# Patient Record
Sex: Female | Born: 1945 | Race: White | Hispanic: No | Smoking: Never smoker
Health system: Southern US, Community
[De-identification: ages and names within clinical notes are randomized; demographics above are authoritative.]

## PROBLEM LIST (undated history)

## (undated) DIAGNOSIS — K219 Gastro-esophageal reflux disease without esophagitis: Secondary | ICD-10-CM

## (undated) DIAGNOSIS — F329 Major depressive disorder, single episode, unspecified: Secondary | ICD-10-CM

## (undated) DIAGNOSIS — I4892 Unspecified atrial flutter: Secondary | ICD-10-CM

## (undated) DIAGNOSIS — F32A Depression, unspecified: Secondary | ICD-10-CM

## (undated) DIAGNOSIS — M1990 Unspecified osteoarthritis, unspecified site: Secondary | ICD-10-CM

## (undated) DIAGNOSIS — I4891 Unspecified atrial fibrillation: Secondary | ICD-10-CM

## (undated) DIAGNOSIS — I499 Cardiac arrhythmia, unspecified: Secondary | ICD-10-CM

## (undated) DIAGNOSIS — T7840XA Allergy, unspecified, initial encounter: Secondary | ICD-10-CM

## (undated) DIAGNOSIS — I10 Essential (primary) hypertension: Secondary | ICD-10-CM

## (undated) DIAGNOSIS — E785 Hyperlipidemia, unspecified: Secondary | ICD-10-CM

## (undated) DIAGNOSIS — G459 Transient cerebral ischemic attack, unspecified: Secondary | ICD-10-CM

## (undated) DIAGNOSIS — E039 Hypothyroidism, unspecified: Secondary | ICD-10-CM

## (undated) HISTORY — DX: Depression, unspecified: F32.A

## (undated) HISTORY — DX: Unspecified atrial flutter: I48.92

## (undated) HISTORY — PX: ABDOMINAL HYSTERECTOMY: SHX81

## (undated) HISTORY — DX: Essential (primary) hypertension: I10

## (undated) HISTORY — DX: Hypothyroidism, unspecified: E03.9

## (undated) HISTORY — DX: Major depressive disorder, single episode, unspecified: F32.9

## (undated) HISTORY — DX: Hyperlipidemia, unspecified: E78.5

## (undated) HISTORY — DX: Gastro-esophageal reflux disease without esophagitis: K21.9

## (undated) HISTORY — DX: Allergy, unspecified, initial encounter: T78.40XA

## (undated) HISTORY — DX: Unspecified atrial fibrillation: I48.91

## (undated) HISTORY — PX: COLONOSCOPY: SHX174

---

## 1987-11-04 HISTORY — PX: PARTIAL HYSTERECTOMY: SHX80

## 1999-01-23 ENCOUNTER — Other Ambulatory Visit: Admission: RE | Admit: 1999-01-23 | Discharge: 1999-01-23 | Payer: Self-pay | Admitting: *Deleted

## 2000-12-18 ENCOUNTER — Other Ambulatory Visit: Admission: RE | Admit: 2000-12-18 | Discharge: 2000-12-18 | Payer: Self-pay | Admitting: Family Medicine

## 2004-02-23 ENCOUNTER — Encounter (INDEPENDENT_AMBULATORY_CARE_PROVIDER_SITE_OTHER): Payer: Self-pay | Admitting: Internal Medicine

## 2004-02-23 LAB — HM COLONOSCOPY

## 2004-11-01 ENCOUNTER — Ambulatory Visit: Payer: Self-pay | Admitting: Internal Medicine

## 2005-01-29 ENCOUNTER — Other Ambulatory Visit: Admission: RE | Admit: 2005-01-29 | Discharge: 2005-01-29 | Payer: Self-pay | Admitting: Internal Medicine

## 2005-01-29 ENCOUNTER — Encounter (INDEPENDENT_AMBULATORY_CARE_PROVIDER_SITE_OTHER): Payer: Self-pay | Admitting: Internal Medicine

## 2005-01-29 ENCOUNTER — Ambulatory Visit: Payer: Self-pay | Admitting: Internal Medicine

## 2005-02-10 ENCOUNTER — Ambulatory Visit: Payer: Self-pay | Admitting: Family Medicine

## 2005-03-05 ENCOUNTER — Ambulatory Visit: Payer: Self-pay | Admitting: Family Medicine

## 2005-03-13 ENCOUNTER — Ambulatory Visit: Payer: Self-pay | Admitting: Family Medicine

## 2005-04-18 ENCOUNTER — Ambulatory Visit: Payer: Self-pay | Admitting: Family Medicine

## 2005-07-14 ENCOUNTER — Ambulatory Visit: Payer: Self-pay | Admitting: Family Medicine

## 2005-08-14 ENCOUNTER — Ambulatory Visit: Payer: Self-pay | Admitting: Family Medicine

## 2005-10-17 ENCOUNTER — Ambulatory Visit: Payer: Self-pay | Admitting: Family Medicine

## 2005-10-21 ENCOUNTER — Ambulatory Visit: Payer: Self-pay | Admitting: Family Medicine

## 2006-02-05 ENCOUNTER — Ambulatory Visit: Payer: Self-pay | Admitting: Family Medicine

## 2006-02-18 LAB — FECAL OCCULT BLOOD, GUAIAC: Fecal Occult Blood: NEGATIVE

## 2006-02-25 ENCOUNTER — Ambulatory Visit: Payer: Self-pay | Admitting: Family Medicine

## 2006-03-06 ENCOUNTER — Ambulatory Visit: Payer: Self-pay | Admitting: Internal Medicine

## 2006-03-26 ENCOUNTER — Encounter (INDEPENDENT_AMBULATORY_CARE_PROVIDER_SITE_OTHER): Payer: Self-pay | Admitting: Internal Medicine

## 2006-04-03 ENCOUNTER — Ambulatory Visit: Payer: Self-pay | Admitting: Family Medicine

## 2006-06-16 ENCOUNTER — Ambulatory Visit: Payer: Self-pay | Admitting: Internal Medicine

## 2006-07-21 ENCOUNTER — Ambulatory Visit: Payer: Self-pay | Admitting: Family Medicine

## 2006-10-16 ENCOUNTER — Ambulatory Visit: Payer: Self-pay | Admitting: Family Medicine

## 2006-10-16 ENCOUNTER — Encounter (INDEPENDENT_AMBULATORY_CARE_PROVIDER_SITE_OTHER): Payer: Self-pay | Admitting: Internal Medicine

## 2006-11-13 ENCOUNTER — Ambulatory Visit: Payer: Self-pay | Admitting: Family Medicine

## 2007-01-26 ENCOUNTER — Ambulatory Visit: Payer: Self-pay | Admitting: Family Medicine

## 2007-02-12 ENCOUNTER — Encounter: Payer: Self-pay | Admitting: Family Medicine

## 2007-04-14 ENCOUNTER — Encounter (INDEPENDENT_AMBULATORY_CARE_PROVIDER_SITE_OTHER): Payer: Self-pay | Admitting: Internal Medicine

## 2007-04-14 DIAGNOSIS — M810 Age-related osteoporosis without current pathological fracture: Secondary | ICD-10-CM | POA: Insufficient documentation

## 2007-04-14 DIAGNOSIS — F329 Major depressive disorder, single episode, unspecified: Secondary | ICD-10-CM | POA: Insufficient documentation

## 2007-04-14 DIAGNOSIS — J309 Allergic rhinitis, unspecified: Secondary | ICD-10-CM | POA: Insufficient documentation

## 2007-04-14 DIAGNOSIS — F32A Depression, unspecified: Secondary | ICD-10-CM | POA: Insufficient documentation

## 2007-04-14 DIAGNOSIS — E039 Hypothyroidism, unspecified: Secondary | ICD-10-CM | POA: Insufficient documentation

## 2007-04-14 DIAGNOSIS — I10 Essential (primary) hypertension: Secondary | ICD-10-CM | POA: Insufficient documentation

## 2007-04-14 DIAGNOSIS — R0982 Postnasal drip: Secondary | ICD-10-CM | POA: Insufficient documentation

## 2007-04-16 ENCOUNTER — Encounter (INDEPENDENT_AMBULATORY_CARE_PROVIDER_SITE_OTHER): Payer: Self-pay | Admitting: Internal Medicine

## 2007-04-16 ENCOUNTER — Ambulatory Visit: Payer: Self-pay | Admitting: Family Medicine

## 2007-04-19 LAB — CONVERTED CEMR LAB
ALT: 21 units/L (ref 0–40)
AST: 22 units/L (ref 0–37)
Direct LDL: 156 mg/dL
HDL: 67.2 mg/dL (ref >39.0)

## 2007-04-22 LAB — CONVERTED CEMR LAB
Bilirubin, Direct: 0.1 mg/dL (ref 0.0–0.3)
Vit D, 1,25-Dihydroxy: 31 (ref 20–57)

## 2007-05-28 ENCOUNTER — Ambulatory Visit: Payer: Self-pay | Admitting: Internal Medicine

## 2007-06-01 LAB — CONVERTED CEMR LAB
AST: 22 units/L (ref 0–37)
Direct LDL: 129.2 mg/dL
Triglycerides: 81 mg/dL (ref 0–149)

## 2007-06-03 ENCOUNTER — Telehealth (INDEPENDENT_AMBULATORY_CARE_PROVIDER_SITE_OTHER): Payer: Self-pay | Admitting: Internal Medicine

## 2007-08-03 ENCOUNTER — Ambulatory Visit: Payer: Self-pay | Admitting: Family Medicine

## 2007-08-03 DIAGNOSIS — M25579 Pain in unspecified ankle and joints of unspecified foot: Secondary | ICD-10-CM | POA: Insufficient documentation

## 2007-11-01 ENCOUNTER — Ambulatory Visit: Payer: Self-pay | Admitting: Family Medicine

## 2007-11-02 LAB — CONVERTED CEMR LAB
ALT: 16 units/L (ref 0–35)
AST: 18 units/L (ref 0–37)
Bilirubin, Direct: 0.1 mg/dL (ref 0.0–0.3)
Cholesterol: 195 mg/dL (ref 0–200)
HDL: 57.6 mg/dL (ref >39.0)
Total Bilirubin: 0.8 mg/dL (ref 0.3–1.2)
Total Protein: 7.2 g/dL (ref 6.0–8.3)
Triglycerides: 143 mg/dL (ref 0–149)

## 2007-12-02 ENCOUNTER — Ambulatory Visit: Payer: Self-pay | Admitting: Family Medicine

## 2007-12-20 ENCOUNTER — Ambulatory Visit: Payer: Self-pay | Admitting: Family Medicine

## 2007-12-20 DIAGNOSIS — L259 Unspecified contact dermatitis, unspecified cause: Secondary | ICD-10-CM | POA: Insufficient documentation

## 2007-12-27 ENCOUNTER — Telehealth (INDEPENDENT_AMBULATORY_CARE_PROVIDER_SITE_OTHER): Payer: Self-pay | Admitting: Internal Medicine

## 2007-12-28 ENCOUNTER — Telehealth (INDEPENDENT_AMBULATORY_CARE_PROVIDER_SITE_OTHER): Payer: Self-pay | Admitting: Internal Medicine

## 2008-01-28 ENCOUNTER — Ambulatory Visit: Payer: Self-pay | Admitting: Family Medicine

## 2008-02-14 ENCOUNTER — Encounter: Payer: Self-pay | Admitting: Family Medicine

## 2008-02-28 ENCOUNTER — Encounter (INDEPENDENT_AMBULATORY_CARE_PROVIDER_SITE_OTHER): Payer: Self-pay | Admitting: *Deleted

## 2008-02-28 ENCOUNTER — Encounter (INDEPENDENT_AMBULATORY_CARE_PROVIDER_SITE_OTHER): Payer: Self-pay | Admitting: Internal Medicine

## 2008-03-13 ENCOUNTER — Ambulatory Visit: Payer: Self-pay | Admitting: Family Medicine

## 2008-03-13 ENCOUNTER — Encounter (INDEPENDENT_AMBULATORY_CARE_PROVIDER_SITE_OTHER): Payer: Self-pay | Admitting: Internal Medicine

## 2008-04-06 ENCOUNTER — Encounter (INDEPENDENT_AMBULATORY_CARE_PROVIDER_SITE_OTHER): Payer: Self-pay | Admitting: *Deleted

## 2008-04-28 ENCOUNTER — Ambulatory Visit: Payer: Self-pay | Admitting: Family Medicine

## 2008-05-01 LAB — CONVERTED CEMR LAB
ALT: 19 units/L (ref 0–35)
AST: 22 units/L (ref 0–37)
Cholesterol: 181 mg/dL (ref 0–200)

## 2008-06-08 ENCOUNTER — Ambulatory Visit: Payer: Self-pay | Admitting: Family Medicine

## 2008-10-03 ENCOUNTER — Ambulatory Visit: Payer: Self-pay | Admitting: Internal Medicine

## 2008-10-05 LAB — CONVERTED CEMR LAB
ALT: 18 units/L (ref 0–35)
Albumin: 4.1 g/dL (ref 3.5–5.2)
Alkaline Phosphatase: 57 units/L (ref 39–117)
BUN: 14 mg/dL (ref 6–23)
Bilirubin, Direct: 0.1 mg/dL (ref 0.0–0.3)
CO2: 30 meq/L (ref 19–32)
Calcium: 9.4 mg/dL (ref 8.4–10.5)
Cholesterol: 166 mg/dL (ref 0–200)
GFR calc Af Amer: 93 mL/min
Glucose, Bld: 78 mg/dL (ref 70–99)
HDL: 70.3 mg/dL (ref >39.0)
Total Protein: 6.9 g/dL (ref 6.0–8.3)
VLDL: 20 mg/dL (ref 0–40)

## 2008-11-20 ENCOUNTER — Telehealth: Payer: Self-pay | Admitting: Family Medicine

## 2008-12-21 ENCOUNTER — Telehealth (INDEPENDENT_AMBULATORY_CARE_PROVIDER_SITE_OTHER): Payer: Self-pay | Admitting: Internal Medicine

## 2009-02-14 ENCOUNTER — Encounter (INDEPENDENT_AMBULATORY_CARE_PROVIDER_SITE_OTHER): Payer: Self-pay | Admitting: Internal Medicine

## 2009-02-15 ENCOUNTER — Ambulatory Visit: Payer: Self-pay | Admitting: Family Medicine

## 2009-02-15 ENCOUNTER — Other Ambulatory Visit: Admission: RE | Admit: 2009-02-15 | Discharge: 2009-02-15 | Payer: Self-pay | Admitting: Family Medicine

## 2009-02-15 ENCOUNTER — Encounter (INDEPENDENT_AMBULATORY_CARE_PROVIDER_SITE_OTHER): Payer: Self-pay | Admitting: Internal Medicine

## 2009-02-15 LAB — HM PAP SMEAR: HM Pap smear: NORMAL

## 2009-02-16 ENCOUNTER — Encounter (INDEPENDENT_AMBULATORY_CARE_PROVIDER_SITE_OTHER): Payer: Self-pay | Admitting: *Deleted

## 2009-02-16 ENCOUNTER — Encounter (INDEPENDENT_AMBULATORY_CARE_PROVIDER_SITE_OTHER): Payer: Self-pay | Admitting: Internal Medicine

## 2009-02-20 ENCOUNTER — Encounter: Payer: Self-pay | Admitting: Family Medicine

## 2009-02-20 ENCOUNTER — Telehealth (INDEPENDENT_AMBULATORY_CARE_PROVIDER_SITE_OTHER): Payer: Self-pay | Admitting: Internal Medicine

## 2009-02-20 ENCOUNTER — Encounter (INDEPENDENT_AMBULATORY_CARE_PROVIDER_SITE_OTHER): Payer: Self-pay | Admitting: *Deleted

## 2009-03-23 ENCOUNTER — Ambulatory Visit: Payer: Self-pay | Admitting: Family Medicine

## 2009-03-27 LAB — CONVERTED CEMR LAB
ALT: 23 units/L (ref 0–35)
AST: 21 units/L (ref 0–37)
Cholesterol: 180 mg/dL (ref 0–200)
LDL Cholesterol: 97 mg/dL (ref 0–99)
Total CHOL/HDL Ratio: 3
Triglycerides: 126 mg/dL (ref 0.0–149.0)

## 2009-04-23 ENCOUNTER — Telehealth: Payer: Self-pay | Admitting: Family Medicine

## 2009-06-21 ENCOUNTER — Telehealth (INDEPENDENT_AMBULATORY_CARE_PROVIDER_SITE_OTHER): Payer: Self-pay | Admitting: Internal Medicine

## 2009-08-07 ENCOUNTER — Ambulatory Visit: Payer: Self-pay | Admitting: Family Medicine

## 2009-09-20 ENCOUNTER — Telehealth (INDEPENDENT_AMBULATORY_CARE_PROVIDER_SITE_OTHER): Payer: Self-pay | Admitting: Internal Medicine

## 2009-10-02 ENCOUNTER — Ambulatory Visit: Payer: Self-pay | Admitting: Family Medicine

## 2009-10-04 LAB — CONVERTED CEMR LAB
Cholesterol: 168 mg/dL (ref 0–200)
TSH: 2.14 microintl units/mL (ref 0.35–5.50)
VLDL: 21.4 mg/dL (ref 0.0–40.0)

## 2009-10-08 ENCOUNTER — Ambulatory Visit: Payer: Self-pay | Admitting: Family Medicine

## 2009-10-08 DIAGNOSIS — M7750 Other enthesopathy of unspecified foot: Secondary | ICD-10-CM | POA: Insufficient documentation

## 2009-11-05 ENCOUNTER — Telehealth: Payer: Self-pay | Admitting: Family Medicine

## 2009-11-20 ENCOUNTER — Encounter: Payer: Self-pay | Admitting: Family Medicine

## 2010-01-08 ENCOUNTER — Encounter: Payer: Self-pay | Admitting: Family Medicine

## 2010-01-10 ENCOUNTER — Ambulatory Visit: Payer: Self-pay | Admitting: Family Medicine

## 2010-01-10 DIAGNOSIS — H6520 Chronic serous otitis media, unspecified ear: Secondary | ICD-10-CM | POA: Insufficient documentation

## 2010-02-19 ENCOUNTER — Encounter: Payer: Self-pay | Admitting: Family Medicine

## 2010-02-25 ENCOUNTER — Encounter (INDEPENDENT_AMBULATORY_CARE_PROVIDER_SITE_OTHER): Payer: Self-pay | Admitting: *Deleted

## 2010-02-28 ENCOUNTER — Ambulatory Visit: Payer: Self-pay | Admitting: Family Medicine

## 2010-03-05 ENCOUNTER — Encounter: Payer: Self-pay | Admitting: Family Medicine

## 2010-03-12 ENCOUNTER — Ambulatory Visit: Payer: Self-pay | Admitting: Family Medicine

## 2010-03-12 DIAGNOSIS — K219 Gastro-esophageal reflux disease without esophagitis: Secondary | ICD-10-CM | POA: Insufficient documentation

## 2010-03-12 DIAGNOSIS — D485 Neoplasm of uncertain behavior of skin: Secondary | ICD-10-CM | POA: Insufficient documentation

## 2010-03-19 ENCOUNTER — Telehealth: Payer: Self-pay | Admitting: Family Medicine

## 2010-03-21 ENCOUNTER — Encounter: Payer: Self-pay | Admitting: Family Medicine

## 2010-03-22 ENCOUNTER — Telehealth: Payer: Self-pay | Admitting: Family Medicine

## 2010-03-26 ENCOUNTER — Encounter: Payer: Self-pay | Admitting: Family Medicine

## 2010-03-26 ENCOUNTER — Ambulatory Visit: Payer: Self-pay | Admitting: Internal Medicine

## 2010-03-29 ENCOUNTER — Ambulatory Visit: Payer: Self-pay | Admitting: Family Medicine

## 2010-05-30 ENCOUNTER — Ambulatory Visit: Payer: Self-pay | Admitting: Family Medicine

## 2010-06-14 ENCOUNTER — Telehealth: Payer: Self-pay | Admitting: Family Medicine

## 2010-06-18 ENCOUNTER — Encounter (INDEPENDENT_AMBULATORY_CARE_PROVIDER_SITE_OTHER): Payer: Self-pay | Admitting: *Deleted

## 2010-07-24 ENCOUNTER — Ambulatory Visit: Payer: Self-pay | Admitting: Family Medicine

## 2010-07-24 DIAGNOSIS — R079 Chest pain, unspecified: Secondary | ICD-10-CM | POA: Insufficient documentation

## 2010-08-23 ENCOUNTER — Telehealth: Payer: Self-pay | Admitting: Family Medicine

## 2010-09-02 ENCOUNTER — Telehealth: Payer: Self-pay | Admitting: Family Medicine

## 2010-09-03 ENCOUNTER — Ambulatory Visit: Payer: Self-pay | Admitting: Family Medicine

## 2010-09-03 DIAGNOSIS — R229 Localized swelling, mass and lump, unspecified: Secondary | ICD-10-CM | POA: Insufficient documentation

## 2010-09-05 ENCOUNTER — Encounter: Admission: RE | Admit: 2010-09-05 | Discharge: 2010-09-05 | Payer: Self-pay | Admitting: Family Medicine

## 2010-09-09 ENCOUNTER — Ambulatory Visit: Payer: Self-pay | Admitting: Family Medicine

## 2010-09-25 ENCOUNTER — Telehealth: Payer: Self-pay | Admitting: Family Medicine

## 2010-12-03 NOTE — Medication Information (Signed)
Summary: Approval for Fexofenadine/[facility]  Approval for Fexofenadine/[facility]   Imported By: Lanelle Bal 03/29/2010 12:39:20  _____________________________________________________________________  External Attachment:    Type:   Image     Comment:   External Document

## 2010-12-03 NOTE — Assessment & Plan Note (Signed)
Summary: ESTABH FROM [facility]   Vital Signs:  Patient profile:   65 year old female Height:      63 inches Weight:      127.0 pounds BMI:     22.58 Temp:     97.5 degrees F oral Pulse rate:   76 / minute Pulse rhythm:   regular BP sitting:   110 / 70  (left arm) Cuff size:   regular  Vitals Entered By: Benny Lennert CMA Duncan Dull) (Mar 29, 2010 7:58 AM)  History of Present Illness: 65 yo new to me here to discuss allergies.   Has had allergies all her life and worse this year with a lot of phlegm and congesiton.   Some sinus headaches over forehead, eyes water and itch. Much worse outdoors.   both in and outdoors  no fever and no green drainage   usied 60 mg allegra two times a day, stopped working this year also flonase 1 spray in each nostril - as needed  advair for asthma two times once daily as needed.    No wheezing.  Advised to try Zyrtec, it's not helping.    has been to an allergist in past - helped for a while and then stopped working -- long time ago - over 10 years      Current Medications (verified): 1)  Flonase 50 Mcg/act Susp (Fluticasone Propionate) .... 2 Sprays in Each Nostril Once Daily 2)  Levothyroxine Sodium 50 Mcg Tabs (Levothyroxine Sodium) .... Take One By Mouth Daily 3)  Quinapril Hcl 10 Mg Tabs (Quinapril Hcl) .... Take One By Mouth Daily 4)  Potassium Chloride Cr 10 Meq Cpcr (Potassium Chloride) .... Take One By Mouth Daily 5)  Bumetanide 1 Mg Tabs (Bumetanide) .... Take One By Mouth Two Times A Day 6)  Calcium 600/vitamin D 600-400 Mg-Unit  Tabs (Calcium Carbonate-Vitamin D) .... Take 1 Tablet By Mouth Two Times A Day 7)  Vitamin E 1000 Unit Caps (Vitamin E) .... Take One By Mouth 3 X A Wk 8)  Advair Diskus 100-50 Mcg/dose Misc (Fluticasone-Salmeterol) .... Use Once Daily As Needed 9)  Lipitor 20 Mg  Tabs (Atorvastatin Calcium) .Marland Kitchen.. 1 Once Daily For Cholesterol 10)  Vitamin D 1000 Unit  Caps (Cholecalciferol) .... Take 1  Cap By Mouth Daily 11)  Premarin 0.3 Mg  Tabs (Estrogens Conjugated) .Marland Kitchen.. 1  Time  A Week 12)  Wellbutrin 75 Mg Tabs (Bupropion Hcl) .Marland Kitchen.. 1 Each Morning and 1 At [facility] Daily 13)  Zyrtec Allergy 10 Mg Tabs (Cetirizine Hcl) .Marland Kitchen.. 1 Pill Each Evening For Allergies Over The Counter 14)  Astepro 0.15 % Soln (Azelastine Hcl) .Marland Kitchen.. 1-2 Sprays in Each Nostril Two Times A Day 15)  Zantac 150 Mg Tabs (Ranitidine Hcl) .Marland Kitchen.. 1 By Mouth Two Times A Day 16)  Singulair 10 Mg Tabs (Montelukast Sodium) .Marland Kitchen.. 1 By Mouth Daily  Allergies (verified): No Known Drug Allergies  Review of Systems      See HPI General:  Denies chills and fever. Resp:  Denies cough, sputum productive, and wheezing. Allergy:  Complains of itching eyes, seasonal allergies, and sneezing.  Physical Exam  General:  Well-developed,well-nourished,in no acute distress; alert,appropriate and cooperative throughout examination Nose:  nares are congested and injected  Mouth:  pharynx pink and moist, no erythema, and no exudates.   Lungs:  Normal respiratory effort, chest expands symmetrically. Lungs are clear to auscultation, no crackles or wheezes. Heart:  Normal rate and regular rhythm.  S1 and S2 normal without gallop, murmur, click, rub or other extra sounds. Extremities:  No clubbing, cyanosis, edema, or deformity noted with normal full range of motion of all joints.   Psych:  normal affect, talkative and pleasant    Impression & Recommendations:  Problem # 1:  ALLERGIC RHINITIS (ICD-477.9) Assessment Deteriorated Time spent with patient 25 minutes, more than 50% of this time was spent counseling patient on allergic rhinitis and treatment options. Given component of asthma, will try Singulair 10 mg daily, advised to stop Zyrtec. Advised flonase daily instead of as needed. Given albuterol inhaler as well.  Her updated medication list for this problem includes:    Flonase 50 Mcg/act Susp (Fluticasone propionate) .Marland Kitchen... 2 sprays in each  nostril once daily    Zyrtec Allergy 10 Mg Tabs (Cetirizine hcl) .Marland Kitchen... 1 pill each evening for allergies over the counter    Astepro 0.15 % Soln (Azelastine hcl) .Marland Kitchen... 1-2 sprays in each nostril two times a day  Complete Medication List: 1)  Flonase 50 Mcg/act Susp (Fluticasone propionate) .... 2 sprays in each nostril once daily 2)  Levothyroxine Sodium 50 Mcg Tabs (Levothyroxine sodium) .... Take one by mouth daily 3)  Quinapril Hcl 10 Mg Tabs (Quinapril hcl) .... Take one by mouth daily 4)  Potassium Chloride Cr 10 Meq Cpcr (Potassium chloride) .... Take one by mouth daily 5)  Bumetanide 1 Mg Tabs (Bumetanide) .... Take one by mouth two times a day 6)  Calcium 600/vitamin D 600-400 Mg-unit Tabs (Calcium carbonate-vitamin d) .... Take 1 tablet by mouth two times a day 7)  Vitamin E 1000 Unit Caps (Vitamin e) .... Take one by mouth 3 x a wk 8)  Advair Diskus 100-50 Mcg/dose Misc (Fluticasone-salmeterol) .... Use once daily as needed 9)  Lipitor 20 Mg Tabs (Atorvastatin calcium) .Marland Kitchen.. 1 once daily for cholesterol 10)  Vitamin D 1000 Unit Caps (Cholecalciferol) .... Take 1 cap by mouth daily 11)  Premarin 0.3 Mg Tabs (Estrogens conjugated) .Marland Kitchen.. 1  time  a week 12)  Wellbutrin 75 Mg Tabs (Bupropion hcl) .Marland Kitchen.. 1 each morning and 1 at noon daily 13)  Zyrtec Allergy 10 Mg Tabs (Cetirizine hcl) .Marland Kitchen.. 1 pill each evening for allergies over the counter 14)  Astepro 0.15 % Soln (Azelastine hcl) .Marland Kitchen.. 1-2 sprays in each nostril two times a day 15)  Zantac 150 Mg Tabs (Ranitidine hcl) .Marland Kitchen.. 1 by mouth two times a day 16)  Singulair 10 Mg Tabs (Montelukast sodium) .Marland Kitchen.. 1 by mouth daily 17)  Proair Hfa 108 (90 Base) Mcg/act Aers (Albuterol sulfate) .... 2 puffs every 4-6 hours needed.  Patient Instructions: 1)  Great to meet you. 2)  Let's start out by taking Singulair 10 mg daily and flonase daily, we can add back the Allegra if necessary. Prescriptions: PROAIR HFA 108 (90 BASE) MCG/ACT AERS (ALBUTEROL  SULFATE) 2 puffs every 4-6 hours needed.  #1 x 0   Entered and Authorized by:   Ruthe Mannan MD   Signed by:   Ruthe Mannan MD on 03/29/2010   Method used:   Samples Given   RxID:   [id number] SINGULAIR 10 MG TABS (MONTELUKAST SODIUM) 1 by mouth daily  #30 x 6   Entered and Authorized by:   Ruthe Mannan MD   Signed by:   Ruthe Mannan MD on 03/29/2010   Method used:   Print then Give to Patient   RxID:   [id number]   Current Allergies (reviewed today): No known allergies

## 2010-12-03 NOTE — Progress Notes (Signed)
Summary: wants to start back on allegra  Phone Note Call from Patient Call back at [phone]   Caller: Patient Summary of Call: Pt wants to go back on generic allegra 60 mg's.  She said the singulair didnt work.  [facility] [address]. Initial call taken by: Lowella Petties CMA,  June 14, 2010 2:26 PM    New/Updated Medications: ALLEGRA 60 MG TABS (FEXOFENADINE HCL) 1 tab by mouth daily. Prescriptions: ALLEGRA 60 MG TABS (FEXOFENADINE HCL) 1 tab by mouth daily.  #30 x 6   Entered and Authorized by:   Ruthe Mannan MD   Signed by:   Ruthe Mannan MD on 06/14/2010   Method used:   [facility]        [facility]  [address] [phone]* (retail)       [address]       [place], [place]  [id number]       Ph: [phone] or [phone]       Fax: [phone]   RxID:   [id number]

## 2010-12-03 NOTE — Assessment & Plan Note (Signed)
Summary: DISCUSS ULTRASOUND RESULTS/CLE   Vital Signs:  Patient profile:   65 year old female Height:      63 inches Weight:      130 pounds BMI:     23.11 Temp:     98.0 degrees F oral Pulse rate:   80 / minute Pulse rhythm:   regular BP sitting:   110 / 62  (right arm) Cuff size:   regular  Vitals Entered By: Linde Gillis CMA Duncan Dull) (September 09, 2010 3:36 PM) CC: discuss ultrasound results   History of Present Illness: Patricia yo here to discuss ultrasound results.  Saw her on 11/1 wtih palpable small mass on right lateral abdomen.  Sent for ultrasound which showed a superficial soft tissue mass 12 x 7 x 6 mm consistent with Lipoma. Pt here with her husband because she has questions about what that means.   Has not grown in size.  Non tender, no eythema. No fevers, chills, nausea, vomiting or diarrhea.   Current Medications (verified): 1)  Flonase 50 Mcg/act Susp (Fluticasone Propionate) .... 2 Sprays in Each Nostril Once Daily 2)  Levothyroxine Sodium 50 Mcg Tabs (Levothyroxine Sodium) .... Take One By Mouth Daily 3)  Quinapril Hcl 10 Mg Tabs (Quinapril Hcl) .... Take One By Mouth Daily 4)  Potassium Chloride Cr 10 Meq Cpcr (Potassium Chloride) .... Take One By Mouth Daily 5)  Bumetanide 1 Mg Tabs (Bumetanide) .... Take One By Mouth Two Times A Day 6)  Calcium 600/vitamin D 600-400 Mg-Unit  Tabs (Calcium Carbonate-Vitamin D) .... Take 1 Tablet By Mouth Two Times A Day 7)  Vitamin E 1000 Unit Caps (Vitamin E) .... Take One By Mouth 3 X A Wk 8)  Advair Diskus 100-50 Mcg/dose Misc (Fluticasone-Salmeterol) .... Use Two Times A Day. 9)  Lipitor 20 Mg  Tabs (Atorvastatin Calcium) .Marland Kitchen.. 1 Once Daily For Cholesterol 10)  Vitamin D 1000 Unit  Caps (Cholecalciferol) .... Take 1 Cap By Mouth Daily 11)  Zantac 150 Mg Tabs (Ranitidine Hcl) .Marland Kitchen.. 1 By Mouth Two Times A Day 12)  Proair Hfa 108 (90 Base) Mcg/act Aers (Albuterol Sulfate) .... 2 Puffs Every 4-6 Hours Needed. 13)  Voltaren 1 %  Gel (Diclofenac Sodium) .... As Needed 14)  Vitamin D3 1000 Unit  Tabs (Cholecalciferol) .Marland Kitchen.. 1 By Mouth Daily 15)  Allegra 60 Mg Tabs (Fexofenadine Hcl) .... Take One By Mouth Two Times A Day 16)  Vicodin 5-500 Mg Tabs (Hydrocodone-Acetaminophen) .Marland Kitchen.. 1 Tab By Mouth Every 6 Hours As Needed For Pain. 17)  Cyclobenzaprine Hcl 10 Mg  Tabs (Cyclobenzaprine Hcl) .Marland Kitchen.. 1 By Mouth 2 Times Daily As Needed For Back Pain  Allergies (verified): No Known Drug Allergies  Review of Systems      See HPI General:  Denies fever.  Physical Exam  General:  Well-developed,well-nourished,in no acute distress; alert,appropriate and cooperative throughout examination VSS, non toxic appearing. Skin:  superficial  palpable mass on right lateral abdomen, unchanged in size, non erythematous, non tender to palp Psych:  normal affect, talkative and pleasant    Impression & Recommendations:  Problem # 1:  LOCALIZED SUPERFICIAL SWELLING MASS OR LUMP (ICD-782.2) Assessment New Utraound consistent with lipompa. Time spent with patient 15 minutes, Patricia than 50% of this time was spent counseling patient on lipomas. Agreed to just watch and wait. If it grows in size at all, will refer to surgery for removal.  Complete Medication List: 1)  Flonase 50 Mcg/act Susp (Fluticasone propionate) .... 2 sprays  in each nostril once daily 2)  Levothyroxine Sodium 50 Mcg Tabs (Levothyroxine sodium) .... Take one by mouth daily 3)  Quinapril Hcl 10 Mg Tabs (Quinapril hcl) .... Take one by mouth daily 4)  Potassium Chloride Cr 10 Meq Cpcr (Potassium chloride) .... Take one by mouth daily 5)  Bumetanide 1 Mg Tabs (Bumetanide) .... Take one by mouth two times a day 6)  Calcium 600/vitamin D 600-400 Mg-unit Tabs (Calcium carbonate-vitamin d) .... Take 1 tablet by mouth two times a day 7)  Vitamin E 1000 Unit Caps (Vitamin e) .... Take one by mouth 3 x a wk 8)  Advair Diskus 100-50 Mcg/dose Misc (Fluticasone-salmeterol) .... Use two  times a day. 9)  Lipitor 20 Mg Tabs (Atorvastatin calcium) .Marland Kitchen.. 1 once daily for cholesterol 10)  Vitamin D 1000 Unit Caps (Cholecalciferol) .... Take 1 cap by mouth daily 11)  Zantac 150 Mg Tabs (Ranitidine hcl) .Marland Kitchen.. 1 by mouth two times a day 12)  Proair Hfa 108 (90 Base) Mcg/act Aers (Albuterol sulfate) .... 2 puffs every 4-6 hours needed. 13)  Voltaren 1 % Gel (Diclofenac sodium) .... As needed 14)  Vitamin D3 1000 Unit Tabs (Cholecalciferol) .Marland Kitchen.. 1 by mouth daily 15)  Allegra 60 Mg Tabs (Fexofenadine hcl) .... Take one by mouth two times a day 16)  Vicodin 5-500 Mg Tabs (Hydrocodone-acetaminophen) .Marland Kitchen.. 1 tab by mouth every 6 hours as needed for pain. 17)  Cyclobenzaprine Hcl 10 Mg Tabs (Cyclobenzaprine hcl) .Marland Kitchen.. 1 by mouth 2 times daily as needed for back pain   Orders Added: 1)  Est. Patient Level III [04540]    Current Allergies (reviewed today): No known allergies

## 2010-12-03 NOTE — Letter (Signed)
Summary: [facility]  [facility]   Imported By: Lanelle Bal 01/17/2010 13:13:59  _____________________________________________________________________  External Attachment:    Type:   Image     Comment:   External Document

## 2010-12-03 NOTE — Assessment & Plan Note (Signed)
Summary: R BREAST,RIB,L KNEE PAIN FROM FALL,   Vital Signs:  Patient profile:   65 year old female Height:      63 inches Weight:      128 pounds BMI:     22.76 Temp:     97.8 degrees F oral Pulse rate:   72 / minute Pulse rhythm:   regular BP sitting:   102 / 62  (right arm) Cuff size:   regular  Vitals Entered By: Linde Gillis CMA Duncan Dull) (July 24, 2010 11:47 AM) CC: right breast, rib, and knee pain from fall   History of Present Illness: 66 yo here for right rib pain, left knee pain s/p fall yesterday.  Was playing tennis yesterday and running for a ball, tripped and landed on her right side. Scrapped her right elbow a little bit but her right ribs have been hurting quite a bit when she takes a deep breath or coughs.  No SOB. Scapped up her right knee as well. No swelling of knee, no pain in knee, no limp or limited ROM.  Current Medications (verified): 1)  Flonase 50 Mcg/act Susp (Fluticasone Propionate) .... 2 Sprays in Each Nostril Once Daily 2)  Levothyroxine Sodium 50 Mcg Tabs (Levothyroxine Sodium) .... Take One By Mouth Daily 3)  Quinapril Hcl 10 Mg Tabs (Quinapril Hcl) .... Take One By Mouth Daily 4)  Potassium Chloride Cr 10 Meq Cpcr (Potassium Chloride) .... Take One By Mouth Daily 5)  Bumetanide 1 Mg Tabs (Bumetanide) .... Take One By Mouth Two Times A Day 6)  Calcium 600/vitamin D 600-400 Mg-Unit  Tabs (Calcium Carbonate-Vitamin D) .... Take 1 Tablet By Mouth Two Times A Day 7)  Vitamin E 1000 Unit Caps (Vitamin E) .... Take One By Mouth 3 X A Wk 8)  Advair Diskus 100-50 Mcg/dose Misc (Fluticasone-Salmeterol) .... Use Once Daily As Needed 9)  Lipitor 20 Mg  Tabs (Atorvastatin Calcium) .Marland Kitchen.. 1 Once Daily For Cholesterol 10)  Vitamin D 1000 Unit  Caps (Cholecalciferol) .... Take 1 Cap By Mouth Daily 11)  Zantac 150 Mg Tabs (Ranitidine Hcl) .Marland Kitchen.. 1 By Mouth Two Times A Day 12)  Proair Hfa 108 (90 Base) Mcg/act Aers (Albuterol Sulfate) .... 2 Puffs Every 4-6 Hours  Needed. 13)  Voltaren 1 % Gel (Diclofenac Sodium) .... As Needed 14)  Vitamin D3 1000 Unit  Tabs (Cholecalciferol) .Marland Kitchen.. 1 By Mouth Daily 15)  Allegra 60 Mg Tabs (Fexofenadine Hcl) .... Take One By Mouth Two Times A Day 16)  Vicodin 5-500 Mg Tabs (Hydrocodone-Acetaminophen) .Marland Kitchen.. 1 Tab By Mouth Every 6 Hours As Needed For Pain. 17)  Cyclobenzaprine Hcl 10 Mg  Tabs (Cyclobenzaprine Hcl) .Marland Kitchen.. 1 By Mouth 2 Times Daily As Needed For Back Pain  Allergies (verified): No Known Drug Allergies  Past History:  Past Medical History: Last updated: 04/14/2007 Allergic rhinitis Depression Hypertension Hypothyroidism Osteopenia  Past Surgical History: Last updated: 04/14/2007 Partial hysterectom 1989 Colonoscopy wnl, 02/23/04 DEXA 50/05---5/07  Family History: Last updated: 01/28/2008 Father: Alive, never sees him Mother:Died at age 87 from breast cancer with brain metastases Siblings: 3 sisters alive and well  Paternal GF had a heart attack Maternal GF had diabetes There is drug abuse in her son and depression in her son Stroke in a paternal aunt.  Social History: Last updated: 02/15/2009 Marital Status: Widowed Children: 2 sons Occupation: [employer], widowed since January 07, 2023, husband died of a cerebral hemorrhage.--3/09 now works in [employer]--02/2009--will be retiring 6/12010  Risk Factors: Alcohol Use: <  1 (02/28/2010) Caffeine Use: 1 (02/28/2010) Exercise: yes (02/28/2010)  Risk Factors: Smoking Status: never (02/28/2010)  Review of Systems      See HPI General:  Denies malaise. Resp:  Denies shortness of breath. MS:  Denies joint pain, joint redness, and joint swelling.  Physical Exam  General:  Well-developed,well-nourished,in no acute distress; alert,appropriate and cooperative throughout examination Chest Wall:  right rib cage mildly tender to palp, pain also illicited with deep insipiration. Lungs:  Normal respiratory effort, chest expands symmetrically.  Lungs are clear to auscultation, no crackles or wheezes. Heart:  Normal rate and regular rhythm. S1 and S2 normal without gallop, murmur, click, rub or other extra sounds. Msk:  Left Knee- FROM, no effusion Neurologic:  alert & oriented X3, sensation intact to light touch, and gait normal.   Skin:  superficial abrasion on left knee and right elbow, no erythema or induration. Psych:  normal affect, talkative and pleasant    Impression & Recommendations:  Problem # 1:  RIB PAIN, RIGHT SIDED (ICD-786.50) Assessment New Likely muscle strain vs bruised(less likely fractured) ribcage. Unecessary at this point to get CXR as it will not change tx. Vicodin as needed pain.   Sent in rx for flexeril as well if no relief from vicodin. Advised to call immediately if symptoms worsen or go to ER if develops SOB. Orders: Prescription Created Electronically [id number])  Complete Medication List: 1)  Flonase 50 Mcg/act Susp (Fluticasone propionate) .... 2 sprays in each nostril once daily 2)  Levothyroxine Sodium 50 Mcg Tabs (Levothyroxine sodium) .... Take one by mouth daily 3)  Quinapril Hcl 10 Mg Tabs (Quinapril hcl) .... Take one by mouth daily 4)  Potassium Chloride Cr 10 Meq Cpcr (Potassium chloride) .... Take one by mouth daily 5)  Bumetanide 1 Mg Tabs (Bumetanide) .... Take one by mouth two times a day 6)  Calcium 600/vitamin D 600-400 Mg-unit Tabs (Calcium carbonate-vitamin d) .... Take 1 tablet by mouth two times a day 7)  Vitamin E 1000 Unit Caps (Vitamin e) .... Take one by mouth 3 x a wk 8)  Advair Diskus 100-50 Mcg/dose Misc (Fluticasone-salmeterol) .... Use once daily as needed 9)  Lipitor 20 Mg Tabs (Atorvastatin calcium) .Marland Kitchen.. 1 once daily for cholesterol 10)  Vitamin D 1000 Unit Caps (Cholecalciferol) .... Take 1 cap by mouth daily 11)  Zantac 150 Mg Tabs (Ranitidine hcl) .Marland Kitchen.. 1 by mouth two times a day 12)  Proair Hfa 108 (90 Base) Mcg/act Aers (Albuterol sulfate) .... 2 puffs every 4-6  hours needed. 13)  Voltaren 1 % Gel (Diclofenac sodium) .... As needed 14)  Vitamin D3 1000 Unit Tabs (Cholecalciferol) .Marland Kitchen.. 1 by mouth daily 15)  Allegra 60 Mg Tabs (Fexofenadine hcl) .... Take one by mouth two times a day 16)  Vicodin 5-500 Mg Tabs (Hydrocodone-acetaminophen) .Marland Kitchen.. 1 tab by mouth every 6 hours as needed for pain. 17)  Cyclobenzaprine Hcl 10 Mg Tabs (Cyclobenzaprine hcl) .Marland Kitchen.. 1 by mouth 2 times daily as needed for back pain Prescriptions: CYCLOBENZAPRINE HCL 10 MG  TABS (CYCLOBENZAPRINE HCL) 1 by mouth 2 times daily as needed for back pain  #20 x 0   Entered and Authorized by:   Ruthe Mannan MD   Signed by:   Ruthe Mannan MD on 07/24/2010   Method used:   [facility]        [facility]  [address] [phone]* (retail)       [address]  [place], [place]  [id number]       Ph: [phone] or [phone]       Fax: [phone]   RxID:   [id number] CYCLOBENZAPRINE HCL 10 MG  TABS (CYCLOBENZAPRINE HCL) 1 by mouth 2 times daily as needed for back pain  #20 x 0   Entered and Authorized by:   Ruthe Mannan MD   Signed by:   Ruthe Mannan MD on 07/24/2010   Method used:   Print then Give to Patient   RxID:   [id number] VICODIN 5-500 MG TABS (HYDROCODONE-ACETAMINOPHEN) 1 tab by mouth every 6 hours as needed for pain.  #30 x 0   Entered and Authorized by:   Ruthe Mannan MD   Signed by:   Ruthe Mannan MD on 07/24/2010   Method used:   Print then Give to Patient   RxID:   [id number]   Current Allergies (reviewed today): No known allergies

## 2010-12-03 NOTE — Assessment & Plan Note (Signed)
Summary: CHECK LUMP ON R SIDE/CLE   Vital Signs:  Patient profile:   65 year old female Height:      63 inches Weight:      128 pounds BMI:     22.76 Temp:     97.7 degrees F oral Pulse rate:   80 / minute Pulse rhythm:   regular BP sitting:   120 / 60  (right arm) Cuff size:   regular  Vitals Entered By: Linde Gillis CMA Duncan Dull) (September 03, 2010 8:56 AM) CC: check lump on right side   History of Present Illness: 65 yo here for lump on her right abdomen.  Noticed it two days ago. Has not grown in size.  Non tender, no eythema. No fevers, chills, nausea, vomiting or diarrhea. Never had anything like this before.  Overall feeling well, has a lot of stress in her life lately.  Some friends are in the hospital, her uncle just passed away.  Current Medications (verified): 1)  Flonase 50 Mcg/act Susp (Fluticasone Propionate) .... 2 Sprays in Each Nostril Once Daily 2)  Levothyroxine Sodium 50 Mcg Tabs (Levothyroxine Sodium) .... Take One By Mouth Daily 3)  Quinapril Hcl 10 Mg Tabs (Quinapril Hcl) .... Take One By Mouth Daily 4)  Potassium Chloride Cr 10 Meq Cpcr (Potassium Chloride) .... Take One By Mouth Daily 5)  Bumetanide 1 Mg Tabs (Bumetanide) .... Take One By Mouth Two Times A Day 6)  Calcium 600/vitamin D 600-400 Mg-Unit  Tabs (Calcium Carbonate-Vitamin D) .... Take 1 Tablet By Mouth Two Times A Day 7)  Vitamin E 1000 Unit Caps (Vitamin E) .... Take One By Mouth 3 X A Wk 8)  Advair Diskus 100-50 Mcg/dose Misc (Fluticasone-Salmeterol) .... Use Two Times A Day. 9)  Lipitor 20 Mg  Tabs (Atorvastatin Calcium) .Marland Kitchen.. 1 Once Daily For Cholesterol 10)  Vitamin D 1000 Unit  Caps (Cholecalciferol) .... Take 1 Cap By Mouth Daily 11)  Zantac 150 Mg Tabs (Ranitidine Hcl) .Marland Kitchen.. 1 By Mouth Two Times A Day 12)  Proair Hfa 108 (90 Base) Mcg/act Aers (Albuterol Sulfate) .... 2 Puffs Every 4-6 Hours Needed. 13)  Voltaren 1 % Gel (Diclofenac Sodium) .... As Needed 14)  Vitamin D3 1000 Unit   Tabs (Cholecalciferol) .Marland Kitchen.. 1 By Mouth Daily 15)  Allegra 60 Mg Tabs (Fexofenadine Hcl) .... Take One By Mouth Two Times A Day 16)  Vicodin 5-500 Mg Tabs (Hydrocodone-Acetaminophen) .Marland Kitchen.. 1 Tab By Mouth Every 6 Hours As Needed For Pain. 17)  Cyclobenzaprine Hcl 10 Mg  Tabs (Cyclobenzaprine Hcl) .Marland Kitchen.. 1 By Mouth 2 Times Daily As Needed For Back Pain  Allergies (verified): No Known Drug Allergies  Past History:  Past Medical History: Last updated: 04/14/2007 Allergic rhinitis Depression Hypertension Hypothyroidism Osteopenia  Past Surgical History: Last updated: 04/14/2007 Partial hysterectom 1989 Colonoscopy wnl, 02/23/04 DEXA 50/05---5/07  Family History: Last updated: 01/28/2008 Father: Alive, never sees him Mother:Died at age 73 from breast cancer with brain metastases Siblings: 3 sisters alive and well  Paternal GF had a heart attack Maternal GF had diabetes There is drug abuse in her son and depression in her son Stroke in a paternal aunt.  Social History: Last updated: 02/15/2009 Marital Status: Widowed Children: 2 sons Occupation: [employer], widowed since 2023-01-03, husband died of a cerebral hemorrhage.--3/09 now works in [employer]--02/2009--will be retiring 6/12010  Risk Factors: Alcohol Use: <1 (02/28/2010) Caffeine Use: 1 (02/28/2010) Exercise: yes (02/28/2010)  Risk Factors: Smoking Status: never (02/28/2010)  Review  of Systems      See HPI General:  Denies fever. GI:  Denies abdominal pain, change in bowel habits, nausea, and vomiting. GU:  Denies discharge and dysuria. MS:  Denies joint pain, joint redness, and joint swelling.  Physical Exam  General:  Well-developed,well-nourished,in no acute distress; alert,appropriate and cooperative throughout examination VSS, non toxic appearing. Mouth:  pharynx pink and moist, no erythema, and no exudates.   Abdomen:  Small, firm, subcutaneous palpable nodule, right lower quadrant.  NTTP. soft,  non-tender, normal bowel sounds, and no distention.   Cervical Nodes:  No lymphadenopathy noted Axillary Nodes:  No palpable lymphadenopathy Inguinal Nodes:  No significant adenopathy Psych:  normal affect, talkative and pleasant    Impression & Recommendations:  Problem # 1:  LOCALIZED SUPERFICIAL SWELLING MASS OR LUMP (ICD-782.2) Assessment New ? reactive lymph node vs other subcutaneous cyst. Discussed with Patricia Peck, no palpable nodes elsewhere throughout her body. Will get an abdominal ultrasound to evaluate further. Orders: Radiology Referral (Radiology)  Complete Medication List: 1)  Flonase 50 Mcg/act Susp (Fluticasone propionate) .... 2 sprays in each nostril once daily 2)  Levothyroxine Sodium 50 Mcg Tabs (Levothyroxine sodium) .... Take one by mouth daily 3)  Quinapril Hcl 10 Mg Tabs (Quinapril hcl) .... Take one by mouth daily 4)  Potassium Chloride Cr 10 Meq Cpcr (Potassium chloride) .... Take one by mouth daily 5)  Bumetanide 1 Mg Tabs (Bumetanide) .... Take one by mouth two times a day 6)  Calcium 600/vitamin D 600-400 Mg-unit Tabs (Calcium carbonate-vitamin d) .... Take 1 tablet by mouth two times a day 7)  Vitamin E 1000 Unit Caps (Vitamin e) .... Take one by mouth 3 x a wk 8)  Advair Diskus 100-50 Mcg/dose Misc (Fluticasone-salmeterol) .... Use two times a day. 9)  Lipitor 20 Mg Tabs (Atorvastatin calcium) .Marland Kitchen.. 1 once daily for cholesterol 10)  Vitamin D 1000 Unit Caps (Cholecalciferol) .... Take 1 cap by mouth daily 11)  Zantac 150 Mg Tabs (Ranitidine hcl) .Marland Kitchen.. 1 by mouth two times a day 12)  Proair Hfa 108 (90 Base) Mcg/act Aers (Albuterol sulfate) .... 2 puffs every 4-6 hours needed. 13)  Voltaren 1 % Gel (Diclofenac sodium) .... As needed 14)  Vitamin D3 1000 Unit Tabs (Cholecalciferol) .Marland Kitchen.. 1 by mouth daily 15)  Allegra 60 Mg Tabs (Fexofenadine hcl) .... Take one by mouth two times a day 16)  Vicodin 5-500 Mg Tabs (Hydrocodone-acetaminophen) .Marland Kitchen.. 1 tab by  mouth every 6 hours as needed for pain. 17)  Cyclobenzaprine Hcl 10 Mg Tabs (Cyclobenzaprine hcl) .Marland Kitchen.. 1 by mouth 2 times daily as needed for back pain  Patient Instructions: 1)  Please stop by to see Patricia Peck on your way out. Prescriptions: ZANTAC 150 MG TABS (RANITIDINE HCL) 1 by mouth two times a day  #180 x 3   Entered and Authorized by:   Ruthe Mannan MD   Signed by:   Ruthe Mannan MD on 09/03/2010   Method used:   Print then Give to Patient   RxID:   [id number] CYCLOBENZAPRINE HCL 10 MG  TABS (CYCLOBENZAPRINE HCL) 1 by mouth 2 times daily as needed for back pain  #180 x 0   Entered and Authorized by:   Ruthe Mannan MD   Signed by:   Ruthe Mannan MD on 09/03/2010   Method used:   Print then Give to Patient   RxID:   [id number] ADVAIR DISKUS 100-50 MCG/DOSE MISC (FLUTICASONE-SALMETEROL) Use two times a day.  #3  x 3   Entered and Authorized by:   Ruthe Mannan MD   Signed by:   Ruthe Mannan MD on 09/03/2010   Method used:   Print then Give to Patient   RxID:   [id number]    Orders Added: 1)  Radiology Referral [Radiology] 2)  Est. Patient Level IV [14782]    Current Allergies (reviewed today): No known allergies

## 2010-12-03 NOTE — Consult Note (Signed)
Summary: [facility],Note  Richard Tuchman,DPM,[facility],Note   Imported By: Beau Fanny 11/23/2009 16:46:25  _____________________________________________________________________  External Attachment:    Type:   Image     Comment:   External Document

## 2010-12-03 NOTE — Assessment & Plan Note (Signed)
Summary: allergies   Vital Signs:  Patient profile:   65 year old female Height:      63 inches Weight:      128 pounds BMI:     22.76 O2 Sat:      97 % on Room air Temp:     97.4 degrees F oral Pulse rate:   76 / minute Pulse rhythm:   regular BP sitting:   114 / 72  (right arm) Cuff size:   regular  Vitals Entered By: Lewanda Rife LPN (Mar 12, 2010 3:43 PM)  O2 Flow:  Room air CC: ?allergies, productive cough with clear mucus, drainage at back of throat, occasional trouble breathing, sometimes has problem swallowing pills and muscles feel tight in back. also wants spot on right wrist checked.   History of Present Illness: has had allergies all her life and worse this year keeps a lot of phlegm and congestion  some sinus headaches over forehead eyes water and itch  both in and outdoors  lot of drainage - is clear  some sneezing  no fever and no green drainage   using 60 mg allegra two times a day -- used to work until recently  also flonase 1 spray in each nostril - as needed  advair for asthma two times once daily -- this got a lot of work after stirring up some dirt in the yard  more coughing lately  not a lot of wheeze   several allergy meds in past - ? what they were has never tried zyrtec   has been to an allergist in past - helped for a while and then stopped working -- long time ago - over 10 years   sometimes has trouble with her meds going down -- like it got lodged  worse over last couple of weeks  a little burning in her throat    spot on wrist to look at - looks suspicous -- about a week   Allergies (verified): No Known Drug Allergies  Past History:  Past Medical History: Last updated: 04/14/2007 Allergic rhinitis Depression Hypertension Hypothyroidism Osteopenia  Past Surgical History: Last updated: 04/14/2007 Partial hysterectom 1989 Colonoscopy wnl, 02/23/04 DEXA 50/05---5/07  Family History: Last updated: 01/28/2008 Father: Alive,  never sees him Mother:Died at age 57 from breast cancer with brain metastases Siblings: 3 sisters alive and well  Paternal GF had a heart attack Maternal GF had diabetes There is drug abuse in her son and depression in her son Stroke in a paternal aunt.  Social History: Last updated: 02/15/2009 Marital Status: Widowed Children: 2 sons Occupation: [employer], widowed since January 24, 2023, husband died of a cerebral hemorrhage.--3/09 now works in [employer]--02/2009--will be retiring 6/12010  Risk Factors: Alcohol Use: <1 (02/28/2010) Caffeine Use: 1 (02/28/2010) Exercise: yes (02/28/2010)  Risk Factors: Smoking Status: never (02/28/2010)  Review of Systems General:  Denies fatigue, loss of appetite, and malaise. Eyes:  Complains of eye irritation; denies blurring, discharge, and eye pain. ENT:  Complains of earache, nasal congestion, postnasal drainage, and sinus pressure; denies ear discharge and sore throat. CV:  Denies chest pain or discomfort, palpitations, and swelling of feet. Resp:  Complains of cough; denies pleuritic, shortness of breath, sputum productive, and wheezing. GI:  Complains of indigestion; denies diarrhea, nausea, and vomiting. MS:  Denies joint pain and muscle weakness. Derm:  Denies itching, lesion(s), poor wound healing, and rash. Neuro:  Complains of headaches; denies numbness and tingling. Heme:  Denies abnormal bruising. Allergy:  Complains of seasonal allergies and sneezing; denies hives or rash.  Physical Exam  General:  Well-developed,well-nourished,in no acute distress; alert,appropriate and cooperative throughout examination Head:  normocephalic, atraumatic, and no abnormalities observed.  no sinus tenderness  Eyes:  vision grossly intact, pupils equal, pupils round, pupils reactive to light, and no injection.   Ears:  TMs are dull bilat  Nose:  nares are congested and injected  Mouth:  pharynx pink and moist, no erythema, and no exudates.    Neck:  supple with full rom and no masses or thyromegally, no JVD or carotid bruit  Lungs:  Normal respiratory effort, chest expands symmetrically. Lungs are clear to auscultation, no crackles or wheezes. Heart:  Normal rate and regular rhythm. S1 and S2 normal without gallop, murmur, click, rub or other extra sounds. Abdomen:  Bowel sounds positive,abdomen soft and non-tender without masses, organomegaly or hernias noted. Skin:  R wrist 3 mm raised shiny pink lesion much solar aging with lentigos Cervical Nodes:  No lymphadenopathy noted Psych:  normal affect, talkative and pleasant    Impression & Recommendations:  Problem # 1:  ALLERGIC RHINITIS (ICD-477.9) Assessment Deteriorated  worse lately - also with acid reflux  will change from allegra to zyrtec inc flonase to 2 sprays in each nostril once daily  add astepro- samples given to try wear mask for yardwork continue advair f/u Dr Patsy Lager 1 mo  The following medications were removed from the medication list:    Allegra 60 Mg Tabs (Fexofenadine hcl) .Marland Kitchen... Take one by mouth two times a day Her updated medication list for this problem includes:    Flonase 50 Mcg/act Susp (Fluticasone propionate) .Marland Kitchen... 2 sprays in each nostril once daily    Zyrtec Allergy 10 Mg Tabs (Cetirizine hcl) .Marland Kitchen... 1 pill each evening for allergies over the counter    Astepro 0.15 % Soln (Azelastine hcl) .Marland Kitchen... 1-2 sprays in each nostril two times a day  Orders: Prescription Created Electronically [phone])  Problem # 2:  GERD (ICD-530.81) Assessment: New  heartburn symptoms also with some dysphagia will try zantac 150 two times a day and f/u 1 mo  if not imp may need to consider endoscopy update if worse pt aware this can worsen asthma as well  Her updated medication list for this problem includes:    Zantac 150 Mg Tabs (Ranitidine hcl) .Marland Kitchen... 1 by mouth two times a day  Orders: Prescription Created Electronically [phone])  Problem # 3:  NEOPLASM,  SKIN, UNCERTAIN BEHAVIOR (ICD-238.2) Assessment: New pink raised area/ shiny - R wrist  uncertain behavior - pt has much solar change and aging will f/u Dr Terri Piedra for eval and likely removal  need to r/o basal cell lesion  Complete Medication List: 1)  Flonase 50 Mcg/act Susp (Fluticasone propionate) .... 2 sprays in each nostril once daily 2)  Levothyroxine Sodium 50 Mcg Tabs (Levothyroxine sodium) .... Take one by mouth daily 3)  Quinapril Hcl 10 Mg Tabs (Quinapril hcl) .... Take one by mouth daily 4)  Potassium Chloride Cr 10 Meq Cpcr (Potassium chloride) .... Take one by mouth daily 5)  Bumetanide 1 Mg Tabs (Bumetanide) .... Take one by mouth two times a day 6)  Calcium 600/vitamin D 600-400 Mg-unit Tabs (Calcium carbonate-vitamin d) .... Take 1 tablet by mouth two times a day 7)  Vitamin E 1000 Unit Caps (Vitamin e) .... Take one by mouth 3 x a wk 8)  Advair Diskus 100-50 Mcg/dose Misc (Fluticasone-salmeterol) .... Use once daily as needed  9)  Lipitor 20 Mg Tabs (Atorvastatin calcium) .Marland Kitchen.. 1 once daily for cholesterol 10)  Vitamin D 1000 Unit Caps (Cholecalciferol) .... Take 1 cap by mouth daily 11)  Premarin 0.3 Mg Tabs (Estrogens conjugated) .Marland Kitchen.. 1  time  a week 12)  Wellbutrin 75 Mg Tabs (Bupropion hcl) .Marland Kitchen.. 1 each morning and 1 at noon daily 13)  Zyrtec Allergy 10 Mg Tabs (Cetirizine hcl) .Marland Kitchen.. 1 pill each evening for allergies over the counter 14)  Astepro 0.15 % Soln (Azelastine hcl) .Marland Kitchen.. 1-2 sprays in each nostril two times a day 15)  Zantac 150 Mg Tabs (Ranitidine hcl) .Marland Kitchen.. 1 by mouth two times a day  Patient Instructions: 1)  stop the allegra  2)  start zyrtec 10 mg 1 pill each night  3)  increase flonase to 2 sprays in each nostril at the same time every day  4)  continue advair 5)  use mask for yardwork  6)  try astepro as directed- samples 7)  make follow up wtih Dr Terri Piedra for spot on wrist  8)  start zantac 150 mg 1 pill two times a day - for acid reflux -- hope this  will also help swallowing 9)  follow up with Dr Patsy Lager in 1 month  Prescriptions: ZANTAC 150 MG TABS (RANITIDINE HCL) 1 by mouth two times a day  #60 x 3   Entered and Authorized by:   Judith Part MD   Signed by:   Judith Part MD on 03/12/2010   Method used:   [phone]       [address]       [place], [place]  [id number]       Ph: [phone] or [phone]       Fax: [phone]   RxID:   [id number]   Current Allergies (reviewed today): No known allergies

## 2010-12-03 NOTE — Miscellaneous (Signed)
Summary: BONE DENSITY  Clinical Lists Changes  Orders: Added new Test order of T-Bone Densitometry (77080) - Signed Added new Test order of T-Lumbar Vertebral Assessment (77082) - Signed 

## 2010-12-03 NOTE — Progress Notes (Signed)
Summary: [facility]/Verify directions  Phone Note From Pharmacy Call back at [phone] opt 2.   Caller: [facility]/Chris Call For: Dr. Dayton Martes  Summary of Call: Received a fax refill on Advair 100/50 with directions once a day as needed. Pharmacist states that this is usuallly recommended a minimum of two times a day and daily instead of as needed. Please call to verify direction Reference # when you call back is [phone]    Initial call taken by: Sydell Axon LPN,  September 02, 2010 1:37 PM  Follow-up for Phone Call        please call pt and find out how she is taking it. Ruthe Mannan MD  September 02, 2010 2:15 PM  Left message on cell phone voicemail for patient to return call.  Linde Gillis CMA Duncan Dull)  September 02, 2010 2:23 PM   Spoke with patient and she uses her Advair at least two times a day sometimes four times per day.   Linde Gillis CMA Duncan Dull)  September 02, 2010 2:56 PM     New/Updated Medications: ADVAIR DISKUS 100-50 MCG/DOSE MISC (FLUTICASONE-SALMETEROL) Use two times a day. Prescriptions: ADVAIR DISKUS 100-50 MCG/DOSE MISC (FLUTICASONE-SALMETEROL) Use two times a day.  #1 x 3   Entered and Authorized by:   Ruthe Mannan MD   Signed by:   Ruthe Mannan MD on 09/02/2010   Method used:   [facility]        [facility] [address] [phone][facility] (retail)       [address]       [place], [place]  [id number]       Ph: [phone] or [phone]       Fax: [phone]   RxID:   [id number]

## 2010-12-03 NOTE — Progress Notes (Signed)
Summary: Request to xfer from Dr Patsy Lager to Dr. Dayton Martes  Phone Note Call from Patient   Caller: Patient Summary of Call: Pt called would like to transfer from Dr. Patsy Lager to Dr. Dayton Martes. Would that be okay per Lupita Leash .Marland KitchenDaine Gip  Mar 22, 2010 9:58 AM- Initial call taken by: Daine Gip,  Mar 22, 2010 9:58 AM  Follow-up for Phone Call        please check with Dr. Patsy Lager first. Ruthe Mannan MD  Mar 22, 2010 10:02 AM   Additional Follow-up for Phone Call Additional follow up Details #1::        That is fine with me. I think this patient actually asked me before --   She would not be a problem patient. My recollection is she had wanted to see one of the female MD's.  Additional Follow-up by: Hannah Beat MD,  Mar 22, 2010 10:11 AM

## 2010-12-03 NOTE — Progress Notes (Signed)
Summary: prior Berkley Harvey is needed for fexofenadine  Phone Note From Pharmacy   [facility]  [address] [id number] Summary of Call: Prior Berkley Harvey is needed for fexofenadine, form is on your desk.              Lowella Petties CMA  Mar 19, 2010 8:26 AM

## 2010-12-03 NOTE — Assessment & Plan Note (Signed)
Summary: L EAR/CLE   Vital Signs:  Patient profile:   65 year old female Height:      63 inches Weight:      127.8 pounds BMI:     22.72 Temp:     97.4 degrees F oral Pulse rate:   76 / minute Pulse rhythm:   regular BP sitting:   108 / 72  (left arm) Cuff size:   regular  Vitals Entered By: Benny Lennert CMA Duncan Dull) (January 10, 2010 10:02 AM)  History of Present Illness: Chief complaint left ear pain for 28 week  64 year old female:  Going on a cruise for 1 week has some runny nose and cough, always has to take some allergy meds every day.   R foot, has been palying a lot of golf and tennis, which was causing her some pain went to [place] for a while, and this made it worse.  She saw one of the podiatrist, place her in a long-leg Aircast walking cast, Now is in a short Cam Walker. She is currently not wearing it, she is mildly improved, however not significantly.  She's not been doing any sort of rehabilitation.  No traumatic injury.  Allergies (verified): No Known Drug Allergies  Past History:  Past medical, surgical, family and social histories (including risk factors) reviewed, and no changes noted (except as noted below).  Past Medical History: Reviewed history from 04/14/2007 and no changes required. Allergic rhinitis Depression Hypertension Hypothyroidism Osteopenia  Past Surgical History: Reviewed history from 04/14/2007 and no changes required. Partial hysterectom 1989 Colonoscopy wnl, 02/23/04 DEXA 50/05---5/07  Family History: Reviewed history from 01/28/2008 and no changes required. Father: Alive, never sees him Mother:Died at age 19 from breast cancer with brain metastases Siblings: 3 sisters alive and well  Paternal GF had a heart attack Maternal GF had diabetes There is drug abuse in her son and depression in her son Stroke in a paternal aunt.  Social History: Reviewed history from 02/15/2009 and no changes required. Marital Status:  Widowed Children: 2 sons Occupation: [employer], widowed since 01-08-2023, husband died of a cerebral hemorrhage.--3/09 now works in [employer]--02/2009--will be retiring 6/12010  Review of Systems       REVIEW OF SYSTEMS  GEN: No systemic complaints, no fevers, chills, sweats, or other acute illnesses MSK: Detailed in the HPI GI: tolerating PO intake without difficulty Neuro: No numbness, parasthesias, or tingling associated. Otherwise the pertinent positives of the ROS are noted above.    Physical Exam  General:  Well-developed,well-nourished,in no acute distress; alert,appropriate and cooperative throughout examination Head:  Normocephalic and atraumatic without obvious abnormalities. No apparent alopecia or balding. Nose:  no external deformity.   Mouth:  Oral mucosa and oropharynx without lesions or exudates.  Teeth in good repair. Neck:  No deformities, masses, or tenderness noted. Lungs:  normal respiratory effort.   Msk:  Echymosis: no Edema: no ROM: full LE B Gait: heel toe, non-antalgic TTP mildly at AT insertion, medial proximal 1st MT MT pain: no Lateral Mall: NT Medial Mall: NT Talus: NT Navicular: NT Cuboid: NT Calcaneous: NT Metatarsals: NT 5th MT: NT Phalanges: NT Achilles: NT Plantar Fascia: NT Fat Pad: NT Peroneals: NT Post Tib: NT Great Toe: Nml motion Ant Drawer: neg ATFL: NT CFL: NT Deltoid: NT Other foot breakdown: none Long arch: preserved Transverse arch: prominent bunions and bunionettes Hindfoot breakdown: none Sensation: intact    Impression & Recommendations:  Problem # 1:  OTITIS MEDIA, SEROUS, CHRONIC (  ICD-381.10) fluid  ok during trip to take sudafed and afrin, not long term  Problem # 2:  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 As needed 2)  Levothyroxine Sodium 50 Mcg Tabs (Levothyroxine sodium) .... Take one by mouth daily 3)  Quinapril Hcl 10 Mg Tabs (Quinapril hcl) .... Take one by mouth daily 4)  Allegra 60 Mg Tabs (Fexofenadine hcl) .... Take one by mouth two times a day 5)  Potassium Chloride Cr 10 Meq Cpcr (Potassium chloride) .... Take one by mouth daily 6)  Bumetanide 1 Mg Tabs (Bumetanide) .... Take one by mouth two times a day 7)  Calcium 600/vitamin D 600-400 Mg-unit Tabs (Calcium carbonate-vitamin d) .... Take 1 tablet by mouth two times a day 8)  Vitamin E 1000 Unit Caps (Vitamin e) .... Take one by mouth 3 x a wk 9)  Advair Diskus 100-50 Mcg/dose Misc (Fluticasone-salmeterol) .... Use once daily as needed 10)  Lipitor 20 Mg Tabs (Atorvastatin calcium) .Marland Kitchen.. 1 once daily for cholesterol 11)  Vitamin D 1000 Unit Caps (Cholecalciferol) .... Take 1 cap by mouth daily 12)  Premarin 0.3 Mg Tabs (Estrogens conjugated) .Marland Kitchen.. 1 three times a week 13)  Wellbutrin 75 Mg Tabs (Bupropion hcl) .Marland Kitchen.. 1 each morning and 1 at noon daily  Current Allergies (reviewed today): No known allergies

## 2010-12-03 NOTE — Progress Notes (Signed)
Summary: foot is no better  Phone Note Call from Patient Call back at Home Phone [phone]   Caller: Patient Call For: Shaune Leeks MD Summary of Call: Pt states her fool is no better and she is asking if she can go for an x-ray, you had suggested podiatry referral in your note.  Please advise. Initial call taken by: Lowella Petties CMA,  November 05, 2009 3:13 PM  Follow-up for Phone Call        Please have her see podiatry.  Follow-up by: Shaune Leeks MD,  November 05, 2009 3:47 PM

## 2010-12-03 NOTE — Assessment & Plan Note (Signed)
Summary: CPX/RBH   Vital Signs:  Patient profile:   65 year old female Height:      63 inches Weight:      127.2 pounds BMI:     22.61 Temp:     98.0 degrees F oral Pulse rate:   68 / minute Pulse rhythm:   regular BP sitting:   102 / 68  (left arm) Cuff size:   regular  Vitals Entered By: Benny Lennert CMA Duncan Dull) (February 28, 2010 2:36 PM)  History of Present Illness: Chief complaint cpx with out pap  f/u DEXA scan  s/p hysterectomy - pap normal 02/2009 vaccines up to date Mammogram UTD colonoscopy  f/u thyroid chol  Preventive Screening-Counseling & Management  Alcohol-Tobacco     Alcohol drinks/day: <1     Alcohol type: wine     Alcohol Counseling: not indicated; use of alcohol is not excessive or problematic     Smoking Status: never     Tobacco Counseling: not indicated; no tobacco use  Caffeine-Diet-Exercise     Caffeine use/day: 1     Diet Counseling: not indicated; diet is assessed to be healthy     Does Patient Exercise: yes     Type of exercise: tennis, golf, walking     Exercise (avg: min/session):     Times/week: walking-4xwk     Exercise Counseling: not indicated; exercise is adequate  Hep-HIV-STD-Contraception     STD Risk: no risk noted     SBE Education/Counseling: to perform regular SBE      Drug Use:  never.    Clinical Review Panels:  Prevention   Last Mammogram:  normal (02/14/2008)   Last Pap Smear:  NEGATIVE FOR INTRAEPITHELIAL LESIONS OR MALIGNANCY. (02/15/2009)  Immunizations   Last Tetanus Booster:  Td (01/29/2005)   Last Flu Vaccine:  Fluvax 3+ (08/07/2009)   Last Pneumovax:  Pneumovax (02/05/2006)   Last Zoster Vaccine:  Zostavax (04/16/2007)  Lipid Management   Cholesterol:  168 (10/02/2009)   LDL (bad choesterol):  94 (10/02/2009)   HDL (good cholesterol):  52.50 (10/02/2009)  Complete Metabolic Panel   Glucose:  78 (10/03/2008)   Sodium:  142 (10/03/2008)   Potassium:  3.8 (10/03/2008)   Chloride:  105  (10/03/2008)   CO2:  30 (10/03/2008)   BUN:  14 (10/03/2008)   Creatinine:  0.8 (10/03/2008)   Albumin:  4.1 (10/03/2008)   Total Protein:  6.9 (10/03/2008)   Calcium:  9.4 (10/03/2008)   Total Bili:  0.8 (10/03/2008)   Alk Phos:  57 (10/03/2008)   SGPT (ALT):  23 (03/23/2009)   SGOT (AST):  21 (03/23/2009)   Allergies (verified): No Known Drug Allergies  Past History:  Past medical, surgical, family and social histories (including risk factors) reviewed, and no changes noted (except as noted below).  Past Medical History: Reviewed history from 04/14/2007 and no changes required. Allergic rhinitis Depression Hypertension Hypothyroidism Osteopenia  Past Surgical History: Reviewed history from 04/14/2007 and no changes required. Partial hysterectom 1989 Colonoscopy wnl, 02/23/04 DEXA 50/05---5/07  Family History: Reviewed history from 01/28/2008 and no changes required. Father: Alive, never sees him Mother:Died at age 57 from breast cancer with brain metastases Siblings: 3 sisters alive and well  Paternal GF had a heart attack Maternal GF had diabetes There is drug abuse in her son and depression in her son Stroke in a paternal aunt.  Social History: Reviewed history from 02/15/2009 and no changes required. Marital Status: Widowed Children: 2 sons Occupation: Arts administrator, widowed  since 03/01, husband died of a cerebral hemorrhage.--3/09 now works in [employer]--02/2009--will be retiring 6/12010 STD Risk:  no risk noted Drug Use:  never  Review of Systems  General: Denies fever, chills, sweats, anorexia, fatigue, weakness, malaise Eyes: Denies blurring, vision loss ENT: Denies earache, nasal congestion, nosebleeds, sore throat, and hoarseness.  Cardiovascular: Denies chest pains, palpitations, syncope, dyspnea on exertion,  Respiratory: Denies cough, dyspnea at rest, excessive sputum,wheeezing GI: Denies nausea, vomiting, diarrhea, constipation,  change in bowel habits, abdominal pain, melena, hematochezia GU: Denies dysuria, hematuria, discharge, urinary frequency, urinary hesitancy, nocturia, incontinence, genital sores, decreased libido Musculoskeletal: FOOT IMPROVING, MILDLY FLARED UP AFTER PLAYING TENNIS THE OTHER DAY Derm: Denies rash, itching Neuro: Denies  paresthesias, frequent falls, frequent headaches, and difficulty walking.  Psych: Denies depression, anxiety Endocrine: Denies cold intolerance, heat intolerance, polydipsia, polyphagia, polyuria, and unusual weight change.  Heme: Denies enlarged lymph nodes Allergy: ALLERGIES FLAIRED UP A LOT RIGHT NOW  Otherwise, the pertinent positives and negatives are listed above and in the HPI, otherwise a full review of systems has been reviewed and is negative unless noted positive.   Physical Exam  General:  Well-developed,well-nourished,in no acute distress; alert,appropriate and cooperative throughout examination Head:  Normocephalic and atraumatic without obvious abnormalities. No apparent alopecia or balding. Eyes:  No corneal or conjunctival inflammation noted. EOMI. Perrla. Funduscopic exam benign, without hemorrhages, exudates or papilledema. Vision grossly normal. Ears:  External ear exam shows no significant lesions or deformities.  Otoscopic examination reveals clear canals, tympanic membranes are intact bilaterally without bulging, retraction, inflammation or discharge. Hearing is grossly normal bilaterally. Nose:  External nasal examination shows no deformity or inflammation. Nasal mucosa are pink and moist without lesions or exudates. Mouth:  Oral mucosa and oropharynx without lesions or exudates.  Teeth in good repair. Neck:  No deformities, masses, or tenderness noted. Breasts:  No mass, nodules, thickening, tenderness, bulging, retraction, inflamation, nipple discharge or skin changes noted.   Lungs:  Normal respiratory effort, chest expands symmetrically. Lungs are  clear to auscultation, no crackles or wheezes. Heart:  Normal rate and regular rhythm. S1 and S2 normal without gallop, murmur, click, rub or other extra sounds. Abdomen:  Bowel sounds positive,abdomen soft and non-tender without masses, organomegaly or hernias noted. Rectal:  defer Msk:  normal ROM and no crepitation.   Extremities:  No clubbing, cyanosis, edema, or deformity noted with normal full range of motion of all joints.   Neurologic:  alert & oriented X3, sensation intact to light touch, and gait normal.   Skin:  scattered healing areas of cryosurgery from derm Cervical Nodes:  No lymphadenopathy noted Axillary Nodes:  No palpable lymphadenopathy Psych:  Cognition and judgment appear intact. Alert and cooperative with normal attention span and concentration. No apparent delusions, illusions, hallucinations   Impression & Recommendations:  Problem # 1:  PREVENTIVE HEALTH CARE (ICD-V70.0) Check DEXA o/w doing well Get her on schedule so labs coincide with office visit.  The patient's preventative maintenance and recommended screening tests for an annual wellness exam were reviewed in full today. Brought up to date unless services declined.  Counselled on the importance of diet, exercise, and its role in overall health and mortality. The patient's FH and SH was reviewed, including their home life, tobacco status, and drug and alcohol status.   Complete Medication List: 1)  Flonase 50 Mcg/act Susp (Fluticasone propionate) .... As needed 2)  Levothyroxine Sodium 50 Mcg Tabs (Levothyroxine sodium) .... Take one by mouth daily  3)  Quinapril Hcl 10 Mg Tabs (Quinapril hcl) .... Take one by mouth daily 4)  Allegra 60 Mg Tabs (Fexofenadine hcl) .... Take one by mouth two times a day 5)  Potassium Chloride Cr 10 Meq Cpcr (Potassium chloride) .... Take one by mouth daily 6)  Bumetanide 1 Mg Tabs (Bumetanide) .... Take one by mouth two times a day 7)  Calcium 600/vitamin D 600-400  Mg-unit Tabs (Calcium carbonate-vitamin d) .... Take 1 tablet by mouth two times a day 8)  Vitamin E 1000 Unit Caps (Vitamin e) .... Take one by mouth 3 x a wk 9)  Advair Diskus 100-50 Mcg/dose Misc (Fluticasone-salmeterol) .... Use once daily as needed 10)  Lipitor 20 Mg Tabs (Atorvastatin calcium) .Marland Kitchen.. 1 once daily for cholesterol 11)  Vitamin D 1000 Unit Caps (Cholecalciferol) .... Take 1 cap by mouth daily 12)  Premarin 0.3 Mg Tabs (Estrogens conjugated) .Marland Kitchen.. 1  time  a week 13)  Wellbutrin 75 Mg Tabs (Bupropion hcl) .Marland Kitchen.. 1 each morning and 1 at noon daily  Other Orders: Radiology Referral (Radiology)  Patient Instructions: 1)  f/u 1 year for CPX 2)  Labs, 1 week before 3)  BMP prior to visit, ICD-9: v58.69 4)  Hepatic Panel prior to visit ICD-9: v58.69 5)  Lipid panel prior to visit ICD-9 : 272.4 6)  TSH prior to visit ICD-9 : 244.9 7)  CBC w/ Diff prior to visit ICD-9 : v58.69  Current Allergies (reviewed today): No known allergies     Prevention & Chronic Care Immunizations   Influenza vaccine: Fluvax 3+  (08/07/2009)    Tetanus booster: 01/29/2005: Td    Pneumococcal vaccine: Pneumovax  (02/05/2006)    H. zoster vaccine: 04/16/2007: Zostavax  Colorectal Screening   Hemoccult: Negative  (02/18/2006)    Colonoscopy: Not documented  Other Screening   Pap smear: NEGATIVE FOR INTRAEPITHELIAL LESIONS OR MALIGNANCY.  (02/15/2009)    Mammogram: normal  (02/14/2008)   Mammogram due: 02/2009    DXA bone density scan: Not documented   DXA bone density action/deferral: Ordered  (02/28/2010)   Smoking status: never  (02/28/2010)  Lipids   Total Cholesterol: 168  (10/02/2009)   LDL: 94  (10/02/2009)   LDL Direct: 129.2  (05/28/2007)   HDL: 52.50  (10/02/2009)   Triglycerides: 107.0  (10/02/2009)    SGOT (AST): 21  (03/23/2009)   SGPT (ALT): 23  (03/23/2009)   Alkaline phosphatase: 57  (10/03/2008)   Total bilirubin: 0.8  (10/03/2008)  Hypertension   Last  Blood Pressure: 102 / 68  (02/28/2010)   Serum creatinine: 0.8  (10/03/2008)   Serum potassium 3.8  (10/03/2008)  Self-Management Support :    Hypertension self-management support: Not documented    Lipid self-management support: Not documented    Nursing Instructions: Schedule screening DXA bone density scan (see order)

## 2010-12-03 NOTE — Progress Notes (Signed)
Summary: wants to go back on wellbutrin  Phone Note Call from Patient Call back at Home Phone [phone]   Caller: Patient Summary of Call: Pt had previously been on wellbutrin 75 mg's two times a day and she woiuld like to start back on it.  She is having some family stress issues.  [facility] [address]. Initial call taken by: Lowella Petties CMA, AAMA,  September 25, 2010 2:28 PM  Follow-up for Phone Call        I think since this is a psychiatric med- it needs to wait for her primary care dr Follow-up by: Judith Part MD,  September 25, 2010 4:48 PM  Additional Follow-up for Phone Call Additional follow up Details #1::        Patient notified as instructed by telephone. Pt said very good and would wait to hear from Dr Dayton Martes next week.Lewanda Rife LPN  September 25, 2010 4:56 PM   Dr Dayton Martes [facility] [address].Patient's son notified as instructed by telephone. Lewanda Rife LPN  September 30, 2010 11:21 AM     New/Updated Medications: WELLBUTRIN 75 MG TABS (BUPROPION HCL) 1 tab by mouth two times a day. Prescriptions: WELLBUTRIN 75 MG TABS (BUPROPION HCL) 1 tab by mouth two times a day.  #60 x 3   Entered and Authorized by:   Ruthe Mannan MD   Signed by:   Ruthe Mannan MD on 09/30/2010   Method used:   [facility]        [facility]  [address] [phone][facility] (retail)       [address]       [place], [place]  [id number]       Ph: [phone] or [phone]       Fax: [phone]   RxID:   [id number]

## 2010-12-03 NOTE — Assessment & Plan Note (Signed)
Summary: DISCUSS BONE DENISTY/NT   Vital Signs:  Patient profile:   Patricia year old female Height:      63 inches Weight:      127.13 pounds BMI:     22.60 Temp:     97.6 degrees F oral Pulse rate:   76 / minute Pulse rhythm:   regular BP sitting:   120 / 80  (right arm) Cuff size:   regular  Vitals Entered By: Linde Gillis CMA Duncan Dull) (May 30, 2010 2:49 PM) CC: discuss bone density, asthma   History of Present Illness: Patricia Peck here to discuss bone denisty results.  Family h/o osteoporosis. Currently taking 1200mg  of Calcium and 800 international units of vitamin D. No h/o falls or fracutres. Very physically active- golf, plays tennis.  Bone desnity showed normal denisty of spine but osteopenia in hips, bilaterally.  Current Medications (verified): 1)  Flonase 50 Mcg/act Susp (Fluticasone Propionate) .... 2 Sprays in Each Nostril Once Daily 2)  Levothyroxine Sodium 50 Mcg Tabs (Levothyroxine Sodium) .... Take One By Mouth Daily 3)  Quinapril Hcl 10 Mg Tabs (Quinapril Hcl) .... Take One By Mouth Daily 4)  Potassium Chloride Cr 10 Meq Cpcr (Potassium Chloride) .... Take One By Mouth Daily 5)  Bumetanide 1 Mg Tabs (Bumetanide) .... Take One By Mouth Two Times A Day 6)  Calcium 600/vitamin D 600-400 Mg-Unit  Tabs (Calcium Carbonate-Vitamin D) .... Take 1 Tablet By Mouth Two Times A Day 7)  Vitamin E 1000 Unit Caps (Vitamin E) .... Take One By Mouth 3 X A Wk 8)  Advair Diskus 100-50 Mcg/dose Misc (Fluticasone-Salmeterol) .... Use Once Daily As Needed 9)  Lipitor 20 Mg  Tabs (Atorvastatin Calcium) .Patricia Peck.. 1 Once Daily For Cholesterol 10)  Vitamin D 1000 Unit  Caps (Cholecalciferol) .... Take 1 Cap By Mouth Daily 11)  Zantac 150 Mg Tabs (Ranitidine Hcl) .Patricia Peck.. 1 By Mouth Two Times A Day 12)  Proair Hfa 108 (90 Base) Mcg/act Aers (Albuterol Sulfate) .... 2 Puffs Every 4-6 Hours Needed. 13)  Voltaren 1 % Gel (Diclofenac Sodium) .... As Needed 14)  Vitamin D3 1000 Unit  Tabs (Cholecalciferol)  .Patricia Peck.. 1 By Mouth Daily  Allergies (verified): No Known Drug Allergies  Past History:  Past Medical History: Last updated: 04/14/2007 Allergic rhinitis Depression Hypertension Hypothyroidism Osteopenia  Past Surgical History: Last updated: 04/14/2007 Partial hysterectom 1989 Colonoscopy wnl, 02/23/04 DEXA 50/05---5/07  Family History: Last updated: 01/28/2008 Father: Alive, never sees him Mother:Died at age 38 from breast cancer with brain metastases Siblings: 3 sisters alive and well  Paternal GF had a heart attack Maternal GF had diabetes There is drug abuse in her son and depression in her son Stroke in a paternal aunt.  Social History: Last updated: 02/15/2009 Marital Status: Widowed Children: 2 sons Occupation: [employer], widowed since Jan 27, 2023, husband died of a cerebral hemorrhage.--3/09 now works in [employer]--02/2009--will be retiring 6/12[address]  Risk Factors: Alcohol Use: <1 (02/28/2010) Caffeine Use: 1 (02/28/2010) Exercise: yes (02/28/2010)  Risk Factors: Smoking Status: never (02/28/2010)  Review of Systems      See HPI MS:  Denies joint pain, joint redness, and joint swelling.  Physical Exam  General:  Well-developed,well-nourished,in no acute distress; alert,appropriate and cooperative throughout examination Psych:  normal affect, talkative and pleasant    Impression & Recommendations:  Problem # 1:  OSTEOPENIA (ICD-733.90) Assessment New Time spent with patient 25 minutes, more than 50% of this time was spent counseling patient on osteopenia. Given that she  is so active and not a fall risk and her hesitation for starting bisphophonate, will increase her VIt D supplementation and reassess in 1 year.  Her updated medication list for this problem includes:    Calcium 600/vitamin D 600-400 Mg-unit Tabs (Calcium carbonate-vitamin d) .Patricia Peck... Take 1 tablet by mouth two times a day    Vitamin D 1000 Unit Caps (Cholecalciferol) .Patricia Peck... Take 1  cap by mouth daily    Vitamin D3 1000 Unit Tabs (Cholecalciferol) .Patricia Peck... 1 by mouth daily  Complete Medication List: 1)  Flonase 50 Mcg/act Susp (Fluticasone propionate) .... 2 sprays in each nostril once daily 2)  Levothyroxine Sodium 50 Mcg Tabs (Levothyroxine sodium) .... Take one by mouth daily 3)  Quinapril Hcl 10 Mg Tabs (Quinapril hcl) .... Take one by mouth daily 4)  Potassium Chloride Cr 10 Meq Cpcr (Potassium chloride) .... Take one by mouth daily 5)  Bumetanide 1 Mg Tabs (Bumetanide) .... Take one by mouth two times a day 6)  Calcium 600/vitamin D 600-400 Mg-unit Tabs (Calcium carbonate-vitamin d) .... Take 1 tablet by mouth two times a day 7)  Vitamin E 1000 Unit Caps (Vitamin e) .... Take one by mouth 3 x a wk 8)  Advair Diskus 100-50 Mcg/dose Misc (Fluticasone-salmeterol) .... Use once daily as needed 9)  Lipitor 20 Mg Tabs (Atorvastatin calcium) .Patricia Peck.. 1 once daily for cholesterol 10)  Vitamin D 1000 Unit Caps (Cholecalciferol) .... Take 1 cap by mouth daily 11)  Zantac 150 Mg Tabs (Ranitidine hcl) .Patricia Peck.. 1 by mouth two times a day 12)  Proair Hfa 108 (90 Base) Mcg/act Aers (Albuterol sulfate) .... 2 puffs every 4-6 hours needed. 13)  Voltaren 1 % Gel (Diclofenac sodium) .... As needed 14)  Vitamin D3 1000 Unit Tabs (Cholecalciferol) .Patricia Peck.. 1 by mouth daily  Patient Instructions: 1)  Great to see you. 2)  Keep taking your 1200 mg of Calcium/800 international units of Vitamin and we will now also add a separate 1000 international units of vitamin d daily. Prescriptions: VITAMIN D3 1000 UNIT  TABS (CHOLECALCIFEROL) 1 by mouth daily  #30 x 11   Entered and Authorized by:   Ruthe Mannan MD   Signed by:   Ruthe Mannan MD on 05/30/2010   Method used:   [facility]        [facility] [address] [phone][facility] (retail)       [address]       [place], [place]  [id number]       Ph: [phone] or [phone]       Fax: [phone]   RxID:    [id number]   Current Allergies (reviewed today): No known allergies

## 2010-12-03 NOTE — Progress Notes (Signed)
Summary: Bumetanide, Hydrocodone/APAP, Cyclobezaprine  Phone Note Refill Request Call back at [phone] Message from:  [facility] on August 23, 2010 9:01 AM  Refills Requested: Medication #1:  BUMETANIDE 1 MG TABS Take one by mouth two times a day   Supply Requested: 3 months  Medication #2:  VICODIN 5-500 MG TABS 1 tab by mouth every 6 hours as needed for pain.   Supply Requested: 3 months  Medication #3:  CYCLOBENZAPRINE HCL 10 MG  TABS 1 by mouth 2 times daily as needed for back pain.   Supply Requested: 3 months Received faxed refill request, please advise.   Method Requested: Fax to Mail Away Pharmacy Initial call taken by: Linde Gillis CMA Duncan Dull),  August 23, 2010 9:02 AM  Follow-up for Phone Call        cyclobenzaprine and Vicodin cannot be given as 3 month supply. Ruthe Mannan MD  August 23, 2010 10:08 AM     Prescriptions: BUMETANIDE 1 MG TABS (BUMETANIDE) Take one by mouth two times a day  #180 x 3   Entered by:   Linde Gillis CMA (AAMA)   Authorized by:   Ruthe Mannan MD   Signed by:   Linde Gillis CMA (AAMA) on 08/23/2010   Method used:   Historical   RxID:   [id number] CYCLOBENZAPRINE HCL 10 MG  TABS (CYCLOBENZAPRINE HCL) 1 by mouth 2 times daily as needed for back pain  #20 x 0   Entered and Authorized by:   Ruthe Mannan MD   Signed by:   Ruthe Mannan MD on 08/23/2010   Method used:   Telephoned to ...       [facility]* (retail)             ,          Ph: [phone]       Fax: [phone]   RxID:   [id number] VICODIN 5-500 MG TABS (HYDROCODONE-ACETAMINOPHEN) 1 tab by mouth every 6 hours as needed for pain.  #90 x 0   Entered and Authorized by:   Ruthe Mannan MD   Signed by:   Ruthe Mannan MD on 08/23/2010   Method used:   Telephoned to ...       [facility]* (retail)             ,          Ph: [phone]       Fax: [phone]   RxID:   [id number] BUMETANIDE 1 MG TABS (BUMETANIDE) Take one by mouth two times a day  #180 x 6  Entered and Authorized by:   Ruthe Mannan MD   Signed by:   Ruthe Mannan MD on 08/23/2010   Method used:   Telephoned to ...       [facility]* (retail)             ,          Ph: [phone]       Fax: [phone]   RxID:   [id number]  Spoke with Marcelline Deist, pharmacy technician at [facility] and gave verbal refills on all three medications above.  Only 3 refills can be authorized at a time on Bumetanide.  Linde Gillis CMA Duncan Dull)  August 23, 2010 11:22 AM

## 2010-12-03 NOTE — Miscellaneous (Signed)
Summary: med list update-  allegra  Clinical Lists Changes  Medications: Changed medication from ALLEGRA 60 MG TABS (FEXOFENADINE HCL) 1 tab by mouth daily. to ALLEGRA 60 MG TABS (FEXOFENADINE HCL) take one by mouth two times a day     Prior Medications: FLONASE 50 MCG/ACT SUSP (FLUTICASONE PROPIONATE) 2 sprays in each nostril once daily LEVOTHYROXINE SODIUM 50 MCG TABS (LEVOTHYROXINE SODIUM) Take one by mouth daily QUINAPRIL HCL 10 MG TABS (QUINAPRIL HCL) Take one by mouth daily POTASSIUM CHLORIDE CR 10 MEQ CPCR (POTASSIUM CHLORIDE) Take one by mouth daily BUMETANIDE 1 MG TABS (BUMETANIDE) Take one by mouth two times a day CALCIUM 600/VITAMIN D 600-400 MG-UNIT  TABS (CALCIUM CARBONATE-VITAMIN D) Take 1 tablet by mouth two times a day VITAMIN E 1000 UNIT CAPS (VITAMIN E) Take one by mouth 3 x a wk ADVAIR DISKUS 100-50 MCG/DOSE MISC (FLUTICASONE-SALMETEROL) Use once daily as needed LIPITOR 20 MG  TABS (ATORVASTATIN CALCIUM) 1 once daily for cholesterol VITAMIN D 1000 UNIT  CAPS (CHOLECALCIFEROL) take 1 cap by mouth daily ZANTAC 150 MG TABS (RANITIDINE HCL) 1 by mouth two times a day PROAIR HFA 108 (90 BASE) MCG/ACT AERS (ALBUTEROL SULFATE) 2 puffs every 4-6 hours needed. VOLTAREN 1 % GEL (DICLOFENAC SODIUM) as needed VITAMIN D3 1000 UNIT  TABS (CHOLECALCIFEROL) 1 by mouth daily Current Allergies: No known allergies

## 2010-12-03 NOTE — Letter (Signed)
Summary: Results Follow up Letter  [place] [facility]  [address] [place], [place] [address]   Phone: [phone]  Fax: [phone]    02/25/2010 MRN: [id number]      JILLIAN WARTH [address] [place], [place]  [address]     Dear Ms. Crispo,  The following are the results of your recent test(s):  Test         Result    Pap Smear:        Normal _____  Not Normal _____ Comments: ______________________________________________________ Cholesterol: LDL(Bad cholesterol):         Your goal is less than:         HDL (Good cholesterol):       Your goal is more than: Comments:  ______________________________________________________ Mammogram:        Normal __x___  Not Normal _____ Comments:Repeat in 1 year  ___________________________________________________________________ Hemoccult:        Normal _____  Not normal _______ Comments:    _____________________________________________________________________ Other Tests:    We routinely do not discuss normal results over the telephone.  If you desire a copy of the results, or you have any questions about this information we can discuss them at your next office visit.   Sincerely,  Hannah Beat MD

## 2011-01-08 ENCOUNTER — Encounter: Payer: Self-pay | Admitting: Family Medicine

## 2011-01-31 ENCOUNTER — Encounter: Payer: Self-pay | Admitting: Gastroenterology

## 2011-02-04 ENCOUNTER — Other Ambulatory Visit: Payer: Self-pay | Admitting: Family Medicine

## 2011-02-04 DIAGNOSIS — I10 Essential (primary) hypertension: Secondary | ICD-10-CM

## 2011-02-04 DIAGNOSIS — Z136 Encounter for screening for cardiovascular disorders: Secondary | ICD-10-CM

## 2011-02-04 DIAGNOSIS — E039 Hypothyroidism, unspecified: Secondary | ICD-10-CM

## 2011-02-04 NOTE — Letter (Signed)
Summary: Pre Visit Letter Revised  [facility]  [address] [place], [place] [address]   Phone: [phone]  Fax: [phone]        01/31/2011 MRN: [id number]  Patricia Peck [address] [place], [place]  [address]             Procedure Date:  03-13-11 8am           Dr Arlyce Dice   Recall Colon   Welcome to the Gastroenterology Division at [facility].    You are scheduled to see a nurse for your pre-procedure visit on 03-03-11 at 2pm on the 3rd floor at [facility], [address]  We ask that you try to arrive at our office 15 minutes prior to your appointment time to allow for check-in.  Please take a minute to review the attached form.  If you answer "Yes" to one or more of the questions on the first page, we ask that you call the person listed at your earliest opportunity.  If you answer "No" to all of the questions, please complete the rest of the form and bring it to your appointment.    Your nurse visit will consist of discussing your medical and surgical history, your immediate family medical history, and your medications.   If you are unable to list all of your medications on the form, please bring the medication bottles to your appointment and we will list them.  We will need to be aware of both prescribed and over the counter drugs.  We will need to know exact dosage information as well.    Please be prepared to read and sign documents such as consent forms, a financial agreement, and acknowledgement forms.  If necessary, and with your consent, a friend or relative is welcome to sit-in on the nurse visit with you.  Please bring your insurance card so that we may make a copy of it.  If your insurance requires a referral to see a specialist, please bring your referral form from your primary care physician.  No co-pay is required for this nurse visit.     If you cannot keep your appointment, please call [phone] to cancel or reschedule prior to  your appointment date.  This allows [place] the opportunity to schedule an appointment for another patient in need of care.    Thank you for choosing [facility] for your medical needs.  We appreciate the opportunity to care for you.  Please visit [place] at our website  to learn more about our practice.  Sincerely, The Gastroenterology Division

## 2011-02-05 ENCOUNTER — Other Ambulatory Visit (INDEPENDENT_AMBULATORY_CARE_PROVIDER_SITE_OTHER): Payer: Medicare Other | Admitting: Family Medicine

## 2011-02-05 DIAGNOSIS — E039 Hypothyroidism, unspecified: Secondary | ICD-10-CM

## 2011-02-05 DIAGNOSIS — Z136 Encounter for screening for cardiovascular disorders: Secondary | ICD-10-CM

## 2011-02-05 DIAGNOSIS — I10 Essential (primary) hypertension: Secondary | ICD-10-CM

## 2011-02-05 LAB — LIPID PANEL
LDL Cholesterol: 92 mg/dL (ref 0–99)
Total CHOL/HDL Ratio: 4
Triglycerides: 133 mg/dL (ref 0.0–149.0)

## 2011-02-05 LAB — BASIC METABOLIC PANEL
BUN: 16 mg/dL (ref 6–23)
CO2: 30 mEq/L (ref 19–32)
Chloride: 103 mEq/L (ref 96–112)
Creatinine, Ser: 0.9 mg/dL (ref 0.4–1.2)
Glucose, Bld: 79 mg/dL (ref 70–99)

## 2011-02-05 LAB — TSH: TSH: 1.84 u[IU]/mL (ref 0.35–5.50)

## 2011-02-11 ENCOUNTER — Other Ambulatory Visit: Payer: Self-pay | Admitting: Gastroenterology

## 2011-03-03 ENCOUNTER — Ambulatory Visit (AMBULATORY_SURGERY_CENTER): Payer: Medicare Other | Admitting: *Deleted

## 2011-03-03 VITALS — Ht 64.0 in | Wt 135.1 lb

## 2011-03-03 DIAGNOSIS — Z1211 Encounter for screening for malignant neoplasm of colon: Secondary | ICD-10-CM

## 2011-03-03 MED ORDER — PEG-KCL-NACL-NASULF-NA ASC-C 100 G PO SOLR: ORAL | Status: DC

## 2011-03-05 ENCOUNTER — Encounter: Payer: Self-pay | Admitting: Family Medicine

## 2011-03-05 ENCOUNTER — Ambulatory Visit (INDEPENDENT_AMBULATORY_CARE_PROVIDER_SITE_OTHER): Payer: Medicare Other | Admitting: Family Medicine

## 2011-03-05 DIAGNOSIS — E039 Hypothyroidism, unspecified: Secondary | ICD-10-CM

## 2011-03-05 DIAGNOSIS — R94120 Abnormal auditory function study: Secondary | ICD-10-CM

## 2011-03-05 DIAGNOSIS — Z124 Encounter for screening for malignant neoplasm of cervix: Secondary | ICD-10-CM

## 2011-03-05 DIAGNOSIS — Z0000 Encounter for general adult medical examination without abnormal findings: Secondary | ICD-10-CM

## 2011-03-05 DIAGNOSIS — I10 Essential (primary) hypertension: Secondary | ICD-10-CM

## 2011-03-05 NOTE — Assessment & Plan Note (Signed)
The patients weight, height, BMI and visual acuity have been recorded in the chart I have made referrals, counseling and provided education to the patient based review of the above and I have provided the pt with a written personalized care plan for preventive services.  Referred to audiology.

## 2011-03-05 NOTE — Patient Instructions (Signed)
Great to see you. Please stop by to see Patricia Peck on your way out.   

## 2011-03-05 NOTE — Progress Notes (Signed)
Pt here for welcome to medicare physical. I have personally reviewed questionnaire and have noted 1. The patient's medical and social history- doing well. Just got married 01/10/2011.  Just got back from a cruise.  2. Their use of alcohol, tobacco or illicit drugs- none 3. Their current medications and supplements- no changes 4. The patient's functional ability including ADL's, fall risks, home safety risks and hearing or visual             Impairment.- see nursing assessment.  Failed audiology screening but reports no problems hearing or asking people to repeat themselves. 5. Diet and physical activities- very physically active.  Plays tennis regularly. 6. Evidence for depression or mood disorders- none.  Had mammogram yesterday. Colonoscopy set up for next week.   The PMH, PSH, Social History, Family History, Medications, and allergies have been reviewed in [facility], and have been updated if relevant.  ROS: General: Denies fever, chills, sweats. No significant weight loss. Eyes: Denies blurring,significant itching ENT: Denies earache, sore throat, and hoarseness.  Cardiovascular: Denies chest pains, palpitations, dyspnea on exertion,  Respiratory: Denies cough, dyspnea at rest,wheeezing Breast: no concerns about lumps GI: Denies nausea, vomiting, diarrhea, constipation, change in bowel habits, abdominal pain, melena, hematochezia GU: Denies dysuria, hematuria, urinary hesitancy, nocturia, denies STD risk, no concerns about discharge Musculoskeletal: Denies back pain, joint pain Derm: Denies rash, itching Neuro: Denies  paresthesias, frequent falls, frequent headaches Psych: Denies depression, anxiety Endocrine: Denies cold intolerance, heat intolerance, polydipsia Heme: Denies enlarged lymph nodes Allergy: No hayfever  Physical exam: The PMH, PSH, Social History, Family History, Medications, and allergies have been reviewed in [facility], and have been updated if relevant.  General:   Well-developed,well-nourished,in no acute distress; alert,appropriate and cooperative throughout examination Head:  normocephalic and atraumatic.   Eyes:  vision grossly intact, pupils equal, pupils round, and pupils reactive to light.   Ears:  R ear normal and L ear normal.   Nose:  no external deformity.   Mouth:  good dentition.   Neck:  No deformities, masses, or tenderness noted. Breasts:  No mass, nodules, thickening, tenderness, bulging, retraction, inflamation, nipple discharge or skin changes noted.   Lungs:  Normal respiratory effort, chest expands symmetrically. Lungs are clear to auscultation, no crackles or wheezes. Heart:  Normal rate and regular rhythm. S1 and S2 normal without gallop, murmur, click, rub or other extra sounds. Abdomen:  Bowel sounds positive,abdomen soft and non-tender without masses, organomegaly or hernias noted. Rectal:  no external abnormalities.   Genitalia:  Pelvic Exam:        External: normal female genitalia without lesions or masses        Vagina: normal without lesions or masses        Cervix: absent        Adnexa: normal bimanual exam without masses or fullness        Uterus: absent        Pap smear: performed Msk:  No deformity or scoliosis noted of thoracic or lumbar spine.   Extremities:  No clubbing, cyanosis, edema, or deformity noted with normal full range of motion of all joints.   Neurologic:  alert & oriented X3 and gait normal.   Skin:  Intact without suspicious lesions or rashes Cervical Nodes:  No lymphadenopathy noted Axillary Nodes:  No palpable lymphadenopathy Psych:  Cognition and judgment appear intact. Alert and cooperative with normal attention span and concentration. No apparent delusions, illusions, hallucinations

## 2011-03-05 NOTE — Assessment & Plan Note (Signed)
Stable

## 2011-03-05 NOTE — Assessment & Plan Note (Signed)
Stable. Continue current dose of Levothyroxine.

## 2011-03-06 ENCOUNTER — Telehealth: Payer: Self-pay | Admitting: *Deleted

## 2011-03-06 NOTE — Telephone Encounter (Signed)
Pt notified that she will not need to have colonoscopy until 2015.  Recall entered into computer. Ezra Sites

## 2011-03-06 NOTE — Telephone Encounter (Signed)
Message copied by Ezra Sites on Thu Mar 06, 2011  8:10 AM ------      Message from: Melvia Heaps MD      Created: Tue Mar 04, 2011  8:25 PM      Regarding: RE: need for colonoscopy?       yes      ----- Message -----         From: Margie Billet, RN         Sent: 03/03/2011   3:18 PM           To: Louis Meckel, MD      Subject: need for colonoscopy?                                    Pt had screening colonoscopy with you 2005.  Normal exam.  No family history of colon cancer.  Pt  Is not having any problems.  Is patient due for recall 2015 instead of now?  Please advise.

## 2011-03-13 ENCOUNTER — Other Ambulatory Visit: Payer: Self-pay | Admitting: Gastroenterology

## 2011-05-01 ENCOUNTER — Other Ambulatory Visit: Payer: Self-pay | Admitting: *Deleted

## 2011-05-01 MED ORDER — BUPROPION HCL 75 MG PO TABS: 75.0000 mg | ORAL_TABLET | Freq: Two times a day (BID) | ORAL | Status: DC

## 2011-06-02 ENCOUNTER — Telehealth: Payer: Self-pay | Admitting: *Deleted

## 2011-06-02 MED ORDER — ESOMEPRAZOLE MAGNESIUM 40 MG PO CPDR: 40.0000 mg | DELAYED_RELEASE_CAPSULE | Freq: Every day | ORAL | Status: DC

## 2011-06-02 NOTE — Telephone Encounter (Signed)
Pt has been taking ranitidine for reflux but she has some samples of nexium and she says these work better for her.  She is asking if a script can be sent to [facility].

## 2011-06-02 NOTE — Telephone Encounter (Signed)
Rx sent 

## 2011-06-04 ENCOUNTER — Other Ambulatory Visit: Payer: Self-pay | Admitting: *Deleted

## 2011-06-04 MED ORDER — VITAMIN D 1000 UNITS PO TABS: 1000.0000 [IU] | ORAL_TABLET | Freq: Every day | ORAL | Status: DC

## 2011-06-04 MED ORDER — BUPROPION HCL 75 MG PO TABS: 75.0000 mg | ORAL_TABLET | Freq: Two times a day (BID) | ORAL | Status: DC

## 2011-06-04 NOTE — Telephone Encounter (Signed)
Pt requests a 90 day supply be sent to [facility].

## 2011-06-06 ENCOUNTER — Other Ambulatory Visit: Payer: Self-pay | Admitting: *Deleted

## 2011-06-06 MED ORDER — VITAMIN D 1000 UNITS PO TABS: 1000.0000 [IU] | ORAL_TABLET | Freq: Every day | ORAL | Status: DC

## 2011-06-25 ENCOUNTER — Ambulatory Visit
Admission: RE | Admit: 2011-06-25 | Discharge: 2011-06-25 | Disposition: A | Discharge status: Home / Self Care | Payer: Medicare Other | Source: Ambulatory Visit | Attending: Internal Medicine | Admitting: Internal Medicine

## 2011-06-25 ENCOUNTER — Other Ambulatory Visit: Payer: Self-pay | Admitting: Internal Medicine

## 2011-06-25 DIAGNOSIS — R609 Edema, unspecified: Secondary | ICD-10-CM

## 2011-09-03 ENCOUNTER — Other Ambulatory Visit: Payer: Self-pay

## 2011-09-03 NOTE — Telephone Encounter (Signed)
Pt said she spoke with [facility] about following refills and was told to call pts dr. Rock Nephew is not sure why. Pt needs the following sent to [facility]  3 month rx with 1 yr refill is pts request.  Pt request Bumetanide 1 mg #180,Potassium 10 meq #90, Levothyroxine 50 mcg #90, Quinapril 10 mg  #90, Atorvastatin 20 mg # 90 and Nexium 40 mg # 90. Pt said thought had refills on Nexium but [facility] said no. Pt can be reached at [phone].

## 2011-09-04 ENCOUNTER — Other Ambulatory Visit: Payer: Self-pay

## 2011-09-04 ENCOUNTER — Ambulatory Visit (INDEPENDENT_AMBULATORY_CARE_PROVIDER_SITE_OTHER): Payer: Medicare Other

## 2011-09-04 DIAGNOSIS — Z23 Encounter for immunization: Secondary | ICD-10-CM

## 2011-09-04 MED ORDER — QUINAPRIL HCL 10 MG PO TABS: 10.0000 mg | ORAL_TABLET | Freq: Every day | ORAL | Status: DC

## 2011-09-04 MED ORDER — ATORVASTATIN CALCIUM 20 MG PO TABS: 20.0000 mg | ORAL_TABLET | Freq: Every day | ORAL | Status: DC

## 2011-09-04 MED ORDER — POTASSIUM CHLORIDE ER 10 MEQ PO CPCR: 10.0000 meq | ORAL_CAPSULE | Freq: Every day | ORAL | Status: DC

## 2011-09-04 MED ORDER — BUMETANIDE 1 MG PO TABS: 1.0000 mg | ORAL_TABLET | Freq: Two times a day (BID) | ORAL | Status: DC

## 2011-09-04 MED ORDER — ESOMEPRAZOLE MAGNESIUM 40 MG PO CPDR: 40.0000 mg | DELAYED_RELEASE_CAPSULE | Freq: Every day | ORAL | Status: DC

## 2011-09-04 NOTE — Telephone Encounter (Signed)
Pt taking Nexium and working well. Pt could not remember why had to get Nexium refilled again. Pt said [facility] said prescription plan would only approve Nexium every other day and needed Dr Dayton Martes to send in request to be given daily.Pt appreciated you sending in other refills. Any questions please call pt [phone].

## 2011-09-04 NOTE — Telephone Encounter (Signed)
Rx's sent to [facility] for 90 day supply.  Left message on machine at home advising patient as instructed.

## 2011-09-05 MED ORDER — ESOMEPRAZOLE MAGNESIUM 40 MG PO CPDR: 40.0000 mg | DELAYED_RELEASE_CAPSULE | Freq: Every day | ORAL | Status: DC

## 2011-09-05 NOTE — Telephone Encounter (Signed)
Rx for Nexium sent to [facility], one tablet by mouth daily.

## 2011-09-05 NOTE — Telephone Encounter (Signed)
Rx called to [facility], could not e-scribe.

## 2011-09-08 ENCOUNTER — Other Ambulatory Visit: Payer: Self-pay | Admitting: *Deleted

## 2011-09-08 MED ORDER — LEVOTHYROXINE SODIUM 50 MCG PO TABS: 50.0000 ug | ORAL_TABLET | Freq: Every day | ORAL | Status: DC

## 2011-09-09 ENCOUNTER — Telehealth: Payer: Self-pay | Admitting: *Deleted

## 2011-09-09 NOTE — Telephone Encounter (Signed)
Prior auth given for nexium, approval letter placed on doctor's desk for signature and scanning. 

## 2011-09-09 NOTE — Telephone Encounter (Signed)
Prior Berkley Harvey is needed form nexium, form is on your desk.

## 2011-09-09 NOTE — Telephone Encounter (Signed)
Form faxed to [facility] at [phone] and given to Laurie. 

## 2011-09-09 NOTE — Telephone Encounter (Signed)
In my box

## 2011-09-10 ENCOUNTER — Other Ambulatory Visit: Payer: Self-pay | Admitting: Family Medicine

## 2011-09-10 MED ORDER — DICLOFENAC SODIUM 1 % TD GEL: TRANSDERMAL | Status: DC

## 2011-09-10 NOTE — Telephone Encounter (Signed)
Patient notified via telephone, Rx sent to [facility] for a 3 month supply.

## 2011-09-10 NOTE — Telephone Encounter (Signed)
Requesting Voltaren gel for arthritis and requesting 6 to be ordered for 3 mo supply for rest of year.  Call back is [phone].

## 2011-09-11 ENCOUNTER — Telehealth: Payer: Self-pay | Admitting: *Deleted

## 2011-09-11 NOTE — Telephone Encounter (Signed)
In my box

## 2011-09-11 NOTE — Telephone Encounter (Signed)
Prior Berkley Harvey is needed for votaren gel, form from [employer] is on your desk.

## 2011-09-11 NOTE — Telephone Encounter (Signed)
Form faxed to [facility] at [phone] and given to Kenner.

## 2011-12-15 ENCOUNTER — Ambulatory Visit (INDEPENDENT_AMBULATORY_CARE_PROVIDER_SITE_OTHER): Payer: Medicare Other | Admitting: Family Medicine

## 2011-12-15 ENCOUNTER — Encounter: Payer: Self-pay | Admitting: Family Medicine

## 2011-12-15 VITALS — BP 120/70 | HR 80 | Temp 98.1°F | Wt 132.2 lb

## 2011-12-15 DIAGNOSIS — J069 Acute upper respiratory infection, unspecified: Secondary | ICD-10-CM | POA: Insufficient documentation

## 2011-12-15 MED ORDER — AZITHROMYCIN 250 MG PO TABS: ORAL_TABLET | ORAL | Status: DC

## 2011-12-15 NOTE — Progress Notes (Signed)
SUBJECTIVE:  Patricia Peck is a 66 y.o. female who complains of coryza, congestion, sneezing, sore throat, dry cough and myalgias for 3 days. She denies a history of anorexia, chest pain, chills and dizziness and denies a history of asthma. Patient denies smoke cigarettes.   Patient Active Problem List  Diagnoses  . NEOPLASM, SKIN, UNCERTAIN BEHAVIOR  . HYPOTHYROIDISM  . DEPRESSION  . OTITIS MEDIA, SEROUS, CHRONIC  . HYPERTENSION  . ALLERGIC RHINITIS  . GERD  . CONTACT DERMATITIS  . PAIN IN JOINT, ANKLE/FOOT  . OTHER ENTHESOPATHY OF ANKLE AND TARSUS  . OSTEOPENIA  . LOCALIZED SUPERFICIAL SWELLING MASS OR LUMP  . RIB PAIN, RIGHT SIDED  . Routine general medical examination at a health care facility  . Failed hearing screening  . URI (upper respiratory infection)   Past Medical History  Diagnosis Date  . Allergy   . Depression   . Hypertension   . Osteoporosis   . Hypothyroidism   . Asthma   . GERD (gastroesophageal reflux disease)    Past Surgical History  Procedure Date  . Partial hysterectomy 1989  . Colonoscopy    History  Substance Use Topics  . Smoking status: Never Smoker   . Smokeless tobacco: Not on file  . Alcohol Use: 1.0 oz/week    2 drink(s) per week   Family History  Problem Relation Age of Onset  . Cancer Mother   . Drug abuse Son   . Depression Son   . Stroke Paternal Aunt   . Heart disease Paternal Grandfather   . Diabetes Maternal Grandfather    No Known Allergies Current Outpatient Prescriptions on File Prior to Visit  Medication Sig Dispense Refill  . albuterol (PROAIR HFA) 108 (90 BASE) MCG/ACT inhaler Inhale 2 puffs into the lungs. Every 4-6 hours       . atorvastatin (LIPITOR) 20 MG tablet Take 1 tablet (20 mg total) by mouth daily.  90 tablet  3  . bumetanide (BUMEX) 1 MG tablet Take 1 tablet (1 mg total) by mouth 2 (two) times daily.  180 tablet  3  . buPROPion (WELLBUTRIN) 75 MG tablet Take 1 tablet (75 mg total) by mouth 2 (two)  times daily.  180 tablet  3  . Calcium Carbonate-Vitamin D (CALCIUM 600/VITAMIN D) 600-400 MG-UNIT per tablet Take 1 tablet by mouth 2 (two) times daily.        . calcium-vitamin D 185 MG TABS Take 185 mg by mouth daily.        . cholecalciferol (VITAMIN D) 1000 UNITS tablet Take 1 tablet (1,000 Units total) by mouth daily.  30 tablet  11  . cyclobenzaprine (FLEXERIL) 10 MG tablet Take 10 mg by mouth 2 (two) times daily as needed. As needed for back pain       . diclofenac sodium (VOLTAREN) 1 % GEL Apply as needed  100 Tube  3  . esomeprazole (NEXIUM) 40 MG capsule Take 1 capsule (40 mg total) by mouth daily.  90 capsule  3  . fexofenadine (ALLEGRA) 60 MG tablet Take 60 mg by mouth 2 (two) times daily.        . Fish Oil-Cholecalciferol (OMEGA-3 FISH OIL/VITAMIN D3) 1000-1000 MG-UNIT CAPS Take by mouth. Take one by mouth daily       . fluticasone (FLONASE) 50 MCG/ACT nasal spray 2 sprays by Nasal route.        . Fluticasone-Salmeterol (ADVAIR DISKUS) 100-50 MCG/DOSE AEPB Inhale 1 puff into the lungs 2 (two)  times daily.        Marland Kitchen HYDROcodone-acetaminophen (VICODIN) 5-500 MG per tablet Take 1 tablet by mouth every 6 (six) hours as needed.        Marland Kitchen levothyroxine (SYNTHROID, LEVOTHROID) 50 MCG tablet Take 1 tablet (50 mcg total) by mouth daily.  90 tablet  3  . potassium chloride (MICRO-K) 10 MEQ CR capsule Take 1 capsule (10 mEq total) by mouth daily.  90 capsule  3  . quinapril (ACCUPRIL) 10 MG tablet Take 1 tablet (10 mg total) by mouth daily.  90 tablet  3  . vitamin E 1000 UNIT capsule Take 1,000 Units by mouth daily.         The PMH, PSH, Social History, Family History, Medications, and allergies have been reviewed in [facility], and have been updated if relevant.  OBJECTIVE: BP 120/70  Pulse 80  Temp(Src) 98.1 F (36.7 C) (Oral)  Wt 132 lb 4 oz (59.988 kg)  SpO2 98%  She appears well, vital signs are as noted. Ears normal.  Throat and pharynx normal.  Neck supple. No adenopathy in the neck.  Nose is congested. Sinuses non tender. The chest is clear, without wheezes or rales.  ASSESSMENT:  viral upper respiratory illness  PLAN: Symptomatic therapy suggested: push fluids, rest and return office visit prn if symptoms persist or worsen. Lack of antibiotic effectiveness discussed with her. Call or return to clinic prn if these symptoms worsen or fail to improve as anticipated.

## 2011-12-15 NOTE — Patient Instructions (Signed)
This is likely a virus. Drink lots of fluids.  Treat sympotmatically with Mucinex, nasal saline irrigation, and Tylenol/Ibuprofen. Also try claritin D or zyrtec D over the counter- two times a day as needed ( have to sign for them at pharmacy). You can use warm compresses.  Cough suppressant at night.  Go ahead and fill your prescription for Zpack (antibiotic) if symptoms do not improve in 5-7 days, sooner if they get worse.

## 2011-12-19 ENCOUNTER — Telehealth: Payer: Self-pay | Admitting: Family Medicine

## 2011-12-19 ENCOUNTER — Other Ambulatory Visit: Payer: Self-pay | Admitting: *Deleted

## 2011-12-19 MED ORDER — GUAIFENESIN-CODEINE 100-10 MG/5ML PO SYRP: 5.0000 mL | ORAL_SOLUTION | Freq: Every evening | ORAL | Status: AC | PRN

## 2011-12-19 NOTE — Telephone Encounter (Signed)
rx called to pharmacy and patient advised 

## 2011-12-19 NOTE — Telephone Encounter (Signed)
May phone in cheratussin - placed in chart.

## 2011-12-19 NOTE — Telephone Encounter (Signed)
Pt called, she has been taking otc cough medication. Would like something stronger. Says she is coughing so much, her ribs is hurting... [facility] [address]

## 2011-12-22 ENCOUNTER — Telehealth: Payer: Self-pay | Admitting: Family Medicine

## 2011-12-22 NOTE — Telephone Encounter (Signed)
Triage Record Num: [id number] Operator: Tomasita Crumble Patient Name: Patricia Peck Call Date & Time: 12/22/2011 3:08:18PM Patient Phone: [phone] PCP: Ruthe Mannan Patient Gender: Female PCP Fax : [phone] Patient DOB: 1946-10-22 Practice Name: [facility] Reason for Call: Caller: Patricia Peck/Patient; PCP: Ruthe Mannan [facility]); CB#: [phone]; Call regarding Just Finished Zpack on 12/21/11 and Not Better Still Has Cough and Congestion; Afebrile/ subjective. Advised Zpack is still in her system, to continue that and to use humid air, increase liquids. Follow up if not improved in another few days. Protocol(s) Used: Cough - Adult Recommended Outcome per Protocol: Provide Home/Self Care Reason for Outcome: Respiratory condition followed by provider AND symptoms relieved with prescribed treatment Care Advice: Limit or avoid exposure to irritants and allergens (e.g. air pollution, smoke/smoking, chemicals, dust, pollen, pet dander, etc.) ~ ~ Call provider if symptoms worsen or new symptoms develop. An annual influenza vaccination is recommended for all adults 55 years of age or older, those with chronic illness, or in contact with high risk individuals. An immunization for pneumonia is also recommended. The frail elderly may require a second pneumonia immunization 3 to 5 years after the first dose. ~ Continue to follow your treatment plan. Take all medications as prescribed and keep all appointments with your provider. ~ ~ CAUTIONS Most adults need to drink 6-10 eight-ounce glasses (1.2-2.0 liters) of fluids per day unless previously told to limit fluid intake for other medical reasons. Limit fluids that contain caffeine, sugar or alcohol. Urine will be a very light yellow color when you drink enough fluids. ~ 12/22/2011 3:20:52PM Page 1 of 1 [email or web address]

## 2011-12-22 NOTE — Telephone Encounter (Signed)
Noted. Agree.

## 2012-01-08 ENCOUNTER — Encounter: Payer: Self-pay | Admitting: Family Medicine

## 2012-01-08 ENCOUNTER — Ambulatory Visit (INDEPENDENT_AMBULATORY_CARE_PROVIDER_SITE_OTHER): Payer: Medicare Other | Admitting: Family Medicine

## 2012-01-08 VITALS — BP 140/86 | HR 76 | Temp 98.0°F | Wt 131.0 lb

## 2012-01-08 DIAGNOSIS — K120 Recurrent oral aphthae: Secondary | ICD-10-CM | POA: Insufficient documentation

## 2012-01-08 MED ORDER — MAGIC MOUTHWASH W/LIDOCAINE: ORAL | Status: DC

## 2012-01-08 NOTE — Progress Notes (Signed)
Subjective:    Patient ID: Patricia Peck, female    DOB: 1945/11/15, 66 y.o.   MRN: [id number]  HPI  66 yo here for "sores in her mouth."  Used some whitening toothpaste a few weeks ago, since then has noticed painful sores inside of her mouth.  She is also under more stress lately and notices that she chews on inside of her lips when she is anxious. Just started wearing her mouthguard at night again which is helping a little.  No difficulty swallowing but certain foods, especially acidic once, cause more pain.  Does see her dentist regularly and is not having any tooth or gum pain.  Patient Active Problem List  Diagnoses  . NEOPLASM, SKIN, UNCERTAIN BEHAVIOR  . HYPOTHYROIDISM  . DEPRESSION  . OTITIS MEDIA, SEROUS, CHRONIC  . HYPERTENSION  . ALLERGIC RHINITIS  . GERD  . CONTACT DERMATITIS  . PAIN IN JOINT, ANKLE/FOOT  . OTHER ENTHESOPATHY OF ANKLE AND TARSUS  . OSTEOPENIA  . LOCALIZED SUPERFICIAL SWELLING MASS OR LUMP  . RIB PAIN, RIGHT SIDED  . Routine general medical examination at a health care facility  . Failed hearing screening  . URI (upper respiratory infection)   Past Medical History  Diagnosis Date  . Allergy   . Depression   . Hypertension   . Osteoporosis   . Hypothyroidism   . Asthma   . GERD (gastroesophageal reflux disease)    Past Surgical History  Procedure Date  . Partial hysterectomy 1989  . Colonoscopy    History  Substance Use Topics  . Smoking status: Never Smoker   . Smokeless tobacco: Not on file  . Alcohol Use: 1.0 oz/week    2 drink(s) per week   Family History  Problem Relation Age of Onset  . Cancer Mother   . Drug abuse Son   . Depression Son   . Stroke Paternal Aunt   . Heart disease Paternal Grandfather   . Diabetes Maternal Grandfather    No Known Allergies Current Outpatient Prescriptions on File Prior to Visit  Medication Sig Dispense Refill  . albuterol (PROAIR HFA) 108 (90 BASE) MCG/ACT inhaler Inhale 2 puffs  into the lungs. Every 4-6 hours       . atorvastatin (LIPITOR) 20 MG tablet Take 1 tablet (20 mg total) by mouth daily.  90 tablet  3  . bumetanide (BUMEX) 1 MG tablet Take 1 tablet (1 mg total) by mouth 2 (two) times daily.  180 tablet  3  . buPROPion (WELLBUTRIN) 75 MG tablet Take 1 tablet (75 mg total) by mouth 2 (two) times daily.  180 tablet  3  . Calcium Carbonate-Vitamin D (CALCIUM 600/VITAMIN D) 600-400 MG-UNIT per tablet Take 1 tablet by mouth 2 (two) times daily.        . calcium-vitamin D 185 MG TABS Take 185 mg by mouth daily.        . cholecalciferol (VITAMIN D) 1000 UNITS tablet Take 1 tablet (1,000 Units total) by mouth daily.  30 tablet  11  . cyclobenzaprine (FLEXERIL) 10 MG tablet Take 10 mg by mouth 2 (two) times daily as needed. As needed for back pain       . diclofenac sodium (VOLTAREN) 1 % GEL Apply as needed  100 Tube  3  . esomeprazole (NEXIUM) 40 MG capsule Take 1 capsule (40 mg total) by mouth daily.  90 capsule  3  . fexofenadine (ALLEGRA) 60 MG tablet Take 60 mg by mouth 2 (two)  times daily.        . fluticasone (FLONASE) 50 MCG/ACT nasal spray 2 sprays by Nasal route.        . Fluticasone-Salmeterol (ADVAIR DISKUS) 100-50 MCG/DOSE AEPB Inhale 1 puff into the lungs 2 (two) times daily.        Marland Kitchen HYDROcodone-acetaminophen (VICODIN) 5-500 MG per tablet Take 1 tablet by mouth every 6 (six) hours as needed.        Marland Kitchen levothyroxine (SYNTHROID, LEVOTHROID) 50 MCG tablet Take 1 tablet (50 mcg total) by mouth daily.  90 tablet  3  . potassium chloride (MICRO-K) 10 MEQ CR capsule Take 1 capsule (10 mEq total) by mouth daily.  90 capsule  3  . quinapril (ACCUPRIL) 10 MG tablet Take 1 tablet (10 mg total) by mouth daily.  90 tablet  3  . vitamin E 1000 UNIT capsule Take 1,000 Units by mouth daily.        . Fish Oil-Cholecalciferol (OMEGA-3 FISH OIL/VITAMIN D3) 1000-1000 MG-UNIT CAPS Take by mouth. Take one by mouth daily        The PMH, PSH, Social History, Family History,  Medications, and allergies have been reviewed in [facility], and have been updated if relevant.   Review of Systems    See HPI No bleeding, fevers or chills. Objective:   Physical Exam BP 140/86  Pulse 76  Temp(Src) 98 F (36.7 C) (Oral)  Wt 131 lb (59.421 kg) Gen:  Alert, pleasant, NAD HEENT:  Pos apthous ulcers involving inner aspect of lip, both upper and bottom. Otherwise, gums and other oral tissues appear healthy Psych:  Good eye contact, not anxious or depressed appearing.    Assessment & Plan:   1. Oral aphthous ulcer    New- reassurance provided and advised supportive care. Rx for magic mouthwash given. See pt instructions for further details.

## 2012-01-08 NOTE — Patient Instructions (Signed)
Good to see you. Please call me tomorrow if you have no improvement of you symptoms.  Oral Ulcers Oral ulcers are painful, shallow sores around the lining of the mouth. They can affect the gums, the inside of the lips and the cheeks (sores on the outside of the lips and on the face are different). They typically first occur in school aged children and teenagers. Oral ulcers may also be called canker sores or cold sores. CAUSES  Canker sores and cold sores can be caused by many factors including:  Infection.   Injury.   Sun exposure.   Medications.   Emotional stress.   Food allergies.   Vitamin deficiencies.   Toothpastes containing sodium lauryl sulfate.  The Herpes Virus can be the cause of mouth ulcers. The first infection can be severe and cause 10 or more ulcers on the gums, tongue and lips with fever and difficulty in swallowing. This infection usually occurs between the ages of 1 and 3 years.  SYMPTOMS  The typical sore is about  inch (6 mm) in size, is an oval or round ulcer with red borders. DIAGNOSIS  Your caregiver can diagnose simple oral ulcers by examination. Additional testing is usually not required.  TREATMENT  Treatment is aimed at pain relief. Generally, oral ulcers resolve by themselves within 1 to 2 weeks without medication and are not contagious unless caused by Herpes (and other viruses). Antibiotics are not effective with mouth sores. Avoid direct contact with others until the ulcer is completely healed. See your caregiver for follow-up care as recommended. Also:  Offer a soft diet.   Encourage plenty of fluids to prevent dehydration. Popsicles and milk shakes can be helpful.   Avoid acidic and salty foods and drinks such as orange juice.   Infants and young children will often refuse to drink because of pain. Using a teaspoon, cup or syringe to give small amounts of fluids frequently can help prevent dehydration.   Cold compresses on the face may help  reduce pain.   Pain medication can help control soreness.   A solution of diphenhydramine mixed with a liquid antacid can be useful to decrease the soreness of ulcers. Consult a caregiver for the dosing.   Liquids or ointments with a numbing ingredient may be helpful when used as recommended.   Older children and teenagers can rinse their mouth with a salt-water mixture (1/2 teaspoonof salt in 8 ounces of water) four times a day. This treatment is uncomfortable but may reduce the time the ulcers are present.   There are many over the counter throat lozenges and medications available for oral ulcers. There effectiveness has not been studied.   Consult your medical caregiver prior to using homeopathic treatments for oral ulcers.  SEEK MEDICAL CARE IF:   You think your child needs to be seen.   The pain worsens and you cannot control it.   There are 4 or more ulcers.   The lips and gums begin to bleed and crust.   A single mouth ulcer is near a tooth that is causing a toothache or pain.   Your child has a fever, swollen face, or swollen glands.   The ulcers began after starting a medication.   Mouth ulcers keep re-occurring or last more than 2 weeks.   You think your child is not taking adequate fluids.  SEEK IMMEDIATE MEDICAL CARE IF:   Your child has a high fever.   Your child is unable to swallow or  becomes dehydrated.   Your child looks or acts very ill.   An ulcer caused by a chemical your child accidentally put in their mouth.  Document Released: 11/27/2004 Document Revised: 10/09/2011 Document Reviewed: 07/12/2009 [facility] Patient Information 2012 [place], [place].

## 2012-01-13 ENCOUNTER — Telehealth: Payer: Self-pay | Admitting: Family Medicine

## 2012-01-13 NOTE — Telephone Encounter (Signed)
Patient was seen last Thursday for a sore on her lip.  Patient has been using Magic Mouthwash and gargling with salt water and using baking soda.  There has been some improvement,but patient said she's going out of the [place] in [place] of weeks and wants to make sure it's take care of.  [facility]Marland Kitchen

## 2012-01-13 NOTE — Telephone Encounter (Signed)
I cannot make it go away any faster unfortunately.  Continue with mouthwash and gargles.

## 2012-01-13 NOTE — Telephone Encounter (Signed)
Advised pt.  She will call back next week if not better.

## 2012-01-19 ENCOUNTER — Encounter: Payer: Self-pay | Admitting: Family Medicine

## 2012-01-19 ENCOUNTER — Ambulatory Visit (INDEPENDENT_AMBULATORY_CARE_PROVIDER_SITE_OTHER): Payer: Medicare Other | Admitting: Family Medicine

## 2012-01-19 VITALS — BP 120/70 | HR 76 | Temp 97.9°F | Wt 133.0 lb

## 2012-01-19 DIAGNOSIS — K120 Recurrent oral aphthae: Secondary | ICD-10-CM

## 2012-01-19 MED ORDER — TRIAMCINOLONE ACETONIDE 0.1 % MT PSTE: PASTE | OROMUCOSAL | Status: DC

## 2012-01-19 MED ORDER — MAGIC MOUTHWASH W/LIDOCAINE: ORAL | Status: DC

## 2012-01-19 NOTE — Progress Notes (Signed)
Subjective:    Patient ID: Patricia Peck, female    DOB: 1946-03-21, 66 y.o.   MRN: [id number]  HPI  66 yo here for follow up "sores in her mouth."  Saw her on 3/7 for sores in mouth and lips. No difficulty swallowing but certain foods, especially acidic once, cause more pain.  Given rx for magic mouthwash, advised salt water gargles as well.  Does see her dentist regularly and is not having any tooth or gum pain.  Sores have improved but still has one on outside of her lip.  Patient Active Problem List  Diagnoses  . NEOPLASM, SKIN, UNCERTAIN BEHAVIOR  . HYPOTHYROIDISM  . DEPRESSION  . OTITIS MEDIA, SEROUS, CHRONIC  . HYPERTENSION  . ALLERGIC RHINITIS  . GERD  . CONTACT DERMATITIS  . PAIN IN JOINT, ANKLE/FOOT  . OTHER ENTHESOPATHY OF ANKLE AND TARSUS  . OSTEOPENIA  . LOCALIZED SUPERFICIAL SWELLING MASS OR LUMP  . RIB PAIN, RIGHT SIDED  . Routine general medical examination at a health care facility  . Failed hearing screening  . URI (upper respiratory infection)  . Oral aphthous ulcer   Past Medical History  Diagnosis Date  . Allergy   . Depression   . Hypertension   . Osteoporosis   . Hypothyroidism   . Asthma   . GERD (gastroesophageal reflux disease)    Past Surgical History  Procedure Date  . Partial hysterectomy 1989  . Colonoscopy    History  Substance Use Topics  . Smoking status: Never Smoker   . Smokeless tobacco: Not on file  . Alcohol Use: 1.0 oz/week    2 drink(s) per week   Family History  Problem Relation Age of Onset  . Cancer Mother   . Drug abuse Son   . Depression Son   . Stroke Paternal Aunt   . Heart disease Paternal Grandfather   . Diabetes Maternal Grandfather    No Known Allergies Current Outpatient Prescriptions on File Prior to Visit  Medication Sig Dispense Refill  . albuterol (PROAIR HFA) 108 (90 BASE) MCG/ACT inhaler Inhale 2 puffs into the lungs. Every 4-6 hours       . atorvastatin (LIPITOR) 20 MG tablet Take 1  tablet (20 mg total) by mouth daily.  90 tablet  3  . bumetanide (BUMEX) 1 MG tablet Take 1 tablet (1 mg total) by mouth 2 (two) times daily.  180 tablet  3  . buPROPion (WELLBUTRIN) 75 MG tablet Take 1 tablet (75 mg total) by mouth 2 (two) times daily.  180 tablet  3  . Calcium Carbonate-Vitamin D (CALCIUM 600/VITAMIN D) 600-400 MG-UNIT per tablet Take 1 tablet by mouth 2 (two) times daily.        . calcium-vitamin D 185 MG TABS Take 185 mg by mouth daily.        . cholecalciferol (VITAMIN D) 1000 UNITS tablet Take 1 tablet (1,000 Units total) by mouth daily.  30 tablet  11  . cyclobenzaprine (FLEXERIL) 10 MG tablet Take 10 mg by mouth 2 (two) times daily as needed. As needed for back pain       . diclofenac sodium (VOLTAREN) 1 % GEL Apply as needed  100 Tube  3  . esomeprazole (NEXIUM) 40 MG capsule Take 1 capsule (40 mg total) by mouth daily.  90 capsule  3  . fexofenadine (ALLEGRA) 60 MG tablet Take 60 mg by mouth 2 (two) times daily.        . Fish Oil-Cholecalciferol (  OMEGA-3 FISH OIL/VITAMIN D3) 1000-1000 MG-UNIT CAPS Take by mouth. Take one by mouth daily       . fluticasone (FLONASE) 50 MCG/ACT nasal spray 2 sprays by Nasal route.        . Fluticasone-Salmeterol (ADVAIR DISKUS) 100-50 MCG/DOSE AEPB Inhale 1 puff into the lungs 2 (two) times daily.        Marland Kitchen HYDROcodone-acetaminophen (VICODIN) 5-500 MG per tablet Take 1 tablet by mouth every 6 (six) hours as needed.        Marland Kitchen levothyroxine (SYNTHROID, LEVOTHROID) 50 MCG tablet Take 1 tablet (50 mcg total) by mouth daily.  90 tablet  3  . potassium chloride (MICRO-K) 10 MEQ CR capsule Take 1 capsule (10 mEq total) by mouth daily.  90 capsule  3  . quinapril (ACCUPRIL) 10 MG tablet Take 1 tablet (10 mg total) by mouth daily.  90 tablet  3  . vitamin E 1000 UNIT capsule Take 1,000 Units by mouth daily.         The PMH, PSH, Social History, Family History, Medications, and allergies have been reviewed in [facility], and have been updated if  relevant.   Review of Systems    See HPI No bleeding, fevers or chills. Objective:   Physical Exam BP 120/70  Pulse 76  Temp(Src) 97.9 F (36.6 C) (Oral)  Wt 133 lb (60.328 kg) Gen:  Alert, pleasant, NAD HEENT:  Pos apthous ulcers involving bottom lip, other ulcers- inner aspect of lip, both upper and bottom have resolved Otherwise, gums and other oral tissues appear healthy Psych:  Good eye contact, not anxious or depressed appearing.    Assessment & Plan:   1. Oral aphthous ulcer    Improving. reassurance provided and advised supportive care. Rx for magic mouthwash refilled and given rx for oral triamcinolone to use.

## 2012-01-22 ENCOUNTER — Telehealth: Payer: Self-pay

## 2012-01-22 NOTE — Telephone Encounter (Signed)
Pt saw Dr Dayton Martes on 01/19/12 and according to pharmacy was given a different formula of magic mouthwash. Pt used one teaspoon of magic mouth wash last night and immediately mouth was numb and pt had a lot of difficulty in swallowing. Pt began rinsing her mouth and throat with warm salt water and within 5 mins. Could swallow again and numbness in mouth was better. Pt said the first time she got magic mouth wash was either pink or yellow color and the 2nd mouthwash was white and chalky. Pt wants first formula of magic mouthwash instead of mouth wash she just got from pharmacy. [facility] [address] and can be contacted at [phone].

## 2012-01-22 NOTE — Telephone Encounter (Signed)
Please call pharmacy and make sure they fill original rx that was sent.

## 2012-01-22 NOTE — Telephone Encounter (Signed)
Script for magic mouthwash with nystatin, hydrocortisone and benedryl called to [address].  Same directions as script sent in on 01/08/12.  Advised patient.

## 2012-01-28 ENCOUNTER — Other Ambulatory Visit: Payer: Self-pay

## 2012-01-28 MED ORDER — TRIAMCINOLONE ACETONIDE 0.1 % MT PSTE: PASTE | OROMUCOSAL | Status: DC

## 2012-01-28 NOTE — Telephone Encounter (Signed)
Pt said she saw Dr Dayton Martes on 01/19/12 with sore inside of mouth and sore is better but not gone and pt is going out of country for 3 weeks and would like refill on Trimcinolone. [facility] [address] and can be contacted at [phone].

## 2012-02-24 ENCOUNTER — Ambulatory Visit (INDEPENDENT_AMBULATORY_CARE_PROVIDER_SITE_OTHER): Payer: Medicare Other | Admitting: Family Medicine

## 2012-02-24 ENCOUNTER — Encounter: Payer: Self-pay | Admitting: Family Medicine

## 2012-02-24 VITALS — BP 100/60 | HR 72 | Temp 97.9°F | Wt 129.0 lb

## 2012-02-24 DIAGNOSIS — R300 Dysuria: Secondary | ICD-10-CM

## 2012-02-24 LAB — POCT URINALYSIS DIPSTICK
Blood, UA: NEGATIVE
Spec Grav, UA: 1.01
Urobilinogen, UA: NEGATIVE

## 2012-02-24 MED ORDER — CIPROFLOXACIN HCL 500 MG PO TABS: 500.0000 mg | ORAL_TABLET | Freq: Two times a day (BID) | ORAL | Status: AC

## 2012-02-24 NOTE — Progress Notes (Signed)
SUBJECTIVE: Patricia Peck is a 66 y.o. female who complains of urinary frequency, urgency and dysuria x 2 days, without flank pain, fever, chills, or abnormal vaginal discharge or bleeding.   Patient Active Problem List  Diagnoses  . NEOPLASM, SKIN, UNCERTAIN BEHAVIOR  . HYPOTHYROIDISM  . DEPRESSION  . OTITIS MEDIA, SEROUS, CHRONIC  . HYPERTENSION  . ALLERGIC RHINITIS  . GERD  . CONTACT DERMATITIS  . PAIN IN JOINT, ANKLE/FOOT  . OTHER ENTHESOPATHY OF ANKLE AND TARSUS  . OSTEOPENIA  . LOCALIZED SUPERFICIAL SWELLING MASS OR LUMP  . RIB PAIN, RIGHT SIDED  . Routine general medical examination at a health care facility  . Failed hearing screening  . URI (upper respiratory infection)  . Oral aphthous ulcer   Past Medical History  Diagnosis Date  . Allergy   . Depression   . Hypertension   . Osteoporosis   . Hypothyroidism   . Asthma   . GERD (gastroesophageal reflux disease)    Past Surgical History  Procedure Date  . Partial hysterectomy 1989  . Colonoscopy    History  Substance Use Topics  . Smoking status: Never Smoker   . Smokeless tobacco: Not on file  . Alcohol Use: 1.0 oz/week    2 drink(s) per week   Family History  Problem Relation Age of Onset  . Cancer Mother   . Drug abuse Son   . Depression Son   . Stroke Paternal Aunt   . Heart disease Paternal Grandfather   . Diabetes Maternal Grandfather    No Known Allergies Current Outpatient Prescriptions on File Prior to Visit  Medication Sig Dispense Refill  . albuterol (PROAIR HFA) 108 (90 BASE) MCG/ACT inhaler Inhale 2 puffs into the lungs. Every 4-6 hours       . Alum & Mag Hydroxide-Simeth (MAGIC MOUTHWASH W/LIDOCAINE) SOLN Swish and spit out 10 ml by mouth up to 4 times daily  240 mL  0  . atorvastatin (LIPITOR) 20 MG tablet Take 1 tablet (20 mg total) by mouth daily.  90 tablet  3  . bumetanide (BUMEX) 1 MG tablet Take 1 tablet (1 mg total) by mouth 2 (two) times daily.  180 tablet  3  . buPROPion  (WELLBUTRIN) 75 MG tablet Take 1 tablet (75 mg total) by mouth 2 (two) times daily.  180 tablet  3  . Calcium Carbonate-Vitamin D (CALCIUM 600/VITAMIN D) 600-400 MG-UNIT per tablet Take 1 tablet by mouth 2 (two) times daily.        . calcium-vitamin D 185 MG TABS Take 185 mg by mouth daily.        . cholecalciferol (VITAMIN D) 1000 UNITS tablet Take 1 tablet (1,000 Units total) by mouth daily.  30 tablet  11  . cyclobenzaprine (FLEXERIL) 10 MG tablet Take 10 mg by mouth 2 (two) times daily as needed. As needed for back pain       . diclofenac sodium (VOLTAREN) 1 % GEL Apply as needed  100 Tube  3  . esomeprazole (NEXIUM) 40 MG capsule Take 1 capsule (40 mg total) by mouth daily.  90 capsule  3  . fexofenadine (ALLEGRA) 60 MG tablet Take 60 mg by mouth 2 (two) times daily.        . Fish Oil-Cholecalciferol (OMEGA-3 FISH OIL/VITAMIN D3) 1000-1000 MG-UNIT CAPS Take by mouth. Take one by mouth daily       . fluticasone (FLONASE) 50 MCG/ACT nasal spray 2 sprays by Nasal route.        Marland Kitchen  Fluticasone-Salmeterol (ADVAIR DISKUS) 100-50 MCG/DOSE AEPB Inhale 1 puff into the lungs 2 (two) times daily.        Marland Kitchen HYDROcodone-acetaminophen (VICODIN) 5-500 MG per tablet Take 1 tablet by mouth every 6 (six) hours as needed.        Marland Kitchen levothyroxine (SYNTHROID, LEVOTHROID) 50 MCG tablet Take 1 tablet (50 mcg total) by mouth daily.  90 tablet  3  . potassium chloride (MICRO-K) 10 MEQ CR capsule Take 1 capsule (10 mEq total) by mouth daily.  90 capsule  3  . quinapril (ACCUPRIL) 10 MG tablet Take 1 tablet (10 mg total) by mouth daily.  90 tablet  3  . triamcinolone (KENALOG) 0.1 % paste Apply film to area twice daily.  5 g  0  . vitamin E 1000 UNIT capsule Take 1,000 Units by mouth daily.         The PMH, PSH, Social History, Family History, Medications, and allergies have been reviewed in [facility], and have been updated if relevant.   OBJECTIVE: BP 100/60  Pulse 72  Temp(Src) 97.9 F (36.6 C) (Oral)  Wt 129 lb (58.514  kg)  Appears well, in no apparent distress.  Vital signs are normal. The abdomen is soft without tenderness, guarding, mass, rebound or organomegaly. No CVA tenderness or inguinal adenopathy noted. Urine dipstick shows positive for RBC's.   ASSESSMENT: UA pos for blood only but symptoms classic for UTI.   PLAN: Treatment per orders - cipro 500 mg twice daily x 3 days, send urine for cx.  Also push fluids, may use Pyridium OTC prn. Call or return to clinic prn if these symptoms worsen or fail to improve as anticipated.

## 2012-02-24 NOTE — Patient Instructions (Signed)
It was good to see you. Take cipro as directed- 1 tablet twice daily for 3 days. Take AZO over the counter for burning. We will call you in 2 days with results of your culture.

## 2012-02-24 NOTE — Progress Notes (Signed)
Addended by: Eliezer Bottom on: 02/24/2012 10:17 AM   Modules accepted: Orders

## 2012-03-01 ENCOUNTER — Other Ambulatory Visit: Payer: Self-pay

## 2012-03-01 MED ORDER — FLUTICASONE PROPIONATE 50 MCG/ACT NA SUSP: 2.0000 | Freq: Every day | NASAL | Status: DC

## 2012-03-01 NOTE — Telephone Encounter (Signed)
Pt [facility]  50 mcg nasal spray #48 g x 1. Pt notified while on phone refill sent. Pt last seen 02/24/12.

## 2012-03-05 ENCOUNTER — Encounter: Payer: Self-pay | Admitting: Family Medicine

## 2012-03-08 ENCOUNTER — Encounter: Payer: Self-pay | Admitting: *Deleted

## 2012-03-08 ENCOUNTER — Encounter: Payer: Self-pay | Admitting: Family Medicine

## 2012-03-24 ENCOUNTER — Other Ambulatory Visit: Payer: Self-pay | Admitting: Family Medicine

## 2012-03-24 DIAGNOSIS — I10 Essential (primary) hypertension: Secondary | ICD-10-CM

## 2012-03-24 DIAGNOSIS — E039 Hypothyroidism, unspecified: Secondary | ICD-10-CM

## 2012-03-24 DIAGNOSIS — Z0000 Encounter for general adult medical examination without abnormal findings: Secondary | ICD-10-CM

## 2012-03-24 DIAGNOSIS — Z136 Encounter for screening for cardiovascular disorders: Secondary | ICD-10-CM

## 2012-04-01 ENCOUNTER — Other Ambulatory Visit (INDEPENDENT_AMBULATORY_CARE_PROVIDER_SITE_OTHER): Payer: Medicare Other

## 2012-04-01 DIAGNOSIS — E039 Hypothyroidism, unspecified: Secondary | ICD-10-CM

## 2012-04-01 DIAGNOSIS — Z0000 Encounter for general adult medical examination without abnormal findings: Secondary | ICD-10-CM

## 2012-04-01 DIAGNOSIS — Z136 Encounter for screening for cardiovascular disorders: Secondary | ICD-10-CM

## 2012-04-01 LAB — LIPID PANEL
Cholesterol: 176 mg/dL (ref 0–200)
HDL: 56.6 mg/dL (ref >39.00)
LDL Cholesterol: 95 mg/dL (ref 0–99)
Total CHOL/HDL Ratio: 3
Triglycerides: 122 mg/dL (ref 0.0–149.0)

## 2012-04-01 LAB — COMPREHENSIVE METABOLIC PANEL
ALT: 23 U/L (ref 0–35)
AST: 23 U/L (ref 0–37)
Alkaline Phosphatase: 72 U/L (ref 39–117)
Creatinine, Ser: 0.8 mg/dL (ref 0.4–1.2)
Sodium: 143 mEq/L (ref 135–145)
Total Bilirubin: 0.7 mg/dL (ref 0.3–1.2)
Total Protein: 7.8 g/dL (ref 6.0–8.3)

## 2012-04-08 ENCOUNTER — Encounter: Payer: Self-pay | Admitting: Family Medicine

## 2012-04-08 ENCOUNTER — Ambulatory Visit (INDEPENDENT_AMBULATORY_CARE_PROVIDER_SITE_OTHER): Payer: Medicare Other | Admitting: Family Medicine

## 2012-04-08 VITALS — BP 102/68 | HR 80 | Temp 97.6°F | Ht 63.5 in | Wt 132.0 lb

## 2012-04-08 DIAGNOSIS — Z1211 Encounter for screening for malignant neoplasm of colon: Secondary | ICD-10-CM

## 2012-04-08 DIAGNOSIS — Z0000 Encounter for general adult medical examination without abnormal findings: Secondary | ICD-10-CM

## 2012-04-08 DIAGNOSIS — M899 Disorder of bone, unspecified: Secondary | ICD-10-CM

## 2012-04-08 DIAGNOSIS — N959 Unspecified menopausal and perimenopausal disorder: Secondary | ICD-10-CM

## 2012-04-08 DIAGNOSIS — E039 Hypothyroidism, unspecified: Secondary | ICD-10-CM

## 2012-04-08 DIAGNOSIS — Z23 Encounter for immunization: Secondary | ICD-10-CM

## 2012-04-08 DIAGNOSIS — M949 Disorder of cartilage, unspecified: Secondary | ICD-10-CM

## 2012-04-08 DIAGNOSIS — I10 Essential (primary) hypertension: Secondary | ICD-10-CM

## 2012-04-08 NOTE — Progress Notes (Signed)
Pt here for annual medicare wellness visit.  I have personally reviewed the Medicare Annual Wellness questionnaire and have noted 1. The patient's medical and social history 2. Their use of alcohol, tobacco or illicit drugs 3. Their current medications and supplements 4. The patient's functional ability including ADL's, fall risks, home safety risks and hearing or visual             impairment. 5. Diet and physical activities 6. Evidence for depression or mood disorders Mammogram last month.  Very physically active- plays tennis daily.  Hypothyroidism- denies any symptoms of hypo or hyperthyroidism. Lab Results  Component Value Date   TSH 2.35 04/01/2012    Lab Results  Component Value Date   CHOL 176 04/01/2012   HDL 56.60 04/01/2012   LDLCALC 95 04/01/2012   LDLDIRECT 129.2 05/28/2007   TRIG 122.0 04/01/2012   CHOLHDL 3 04/01/2012     Had mammogram last month.  Due for pneumovax and dexa scan.  UTD on all other prevention.  Patient Active Problem List  Diagnoses  . NEOPLASM, SKIN, UNCERTAIN BEHAVIOR  . HYPOTHYROIDISM  . DEPRESSION  . OTITIS MEDIA, SEROUS, CHRONIC  . HYPERTENSION  . ALLERGIC RHINITIS  . GERD  . CONTACT DERMATITIS  . PAIN IN JOINT, ANKLE/FOOT  . OTHER ENTHESOPATHY OF ANKLE AND TARSUS  . OSTEOPENIA  . LOCALIZED SUPERFICIAL SWELLING MASS OR LUMP  . RIB PAIN, RIGHT SIDED  . Routine general medical examination at a health care facility  . Failed hearing screening  . URI (upper respiratory infection)  . Oral aphthous ulcer   Past Medical History  Diagnosis Date  . Allergy   . Depression   . Hypertension   . Osteoporosis   . Hypothyroidism   . Asthma   . GERD (gastroesophageal reflux disease)    Past Surgical History  Procedure Date  . Partial hysterectomy 1989  . Colonoscopy    History  Substance Use Topics  . Smoking status: Never Smoker   . Smokeless tobacco: Not on file  . Alcohol Use: 1.0 oz/week    2 drink(s) per week   Family  History  Problem Relation Age of Onset  . Cancer Mother   . Drug abuse Son   . Depression Son   . Stroke Paternal Aunt   . Heart disease Paternal Grandfather   . Diabetes Maternal Grandfather    No Known Allergies Current Outpatient Prescriptions on File Prior to Visit  Medication Sig Dispense Refill  . albuterol (PROAIR HFA) 108 (90 BASE) MCG/ACT inhaler Inhale 2 puffs into the lungs. Every 4-6 hours       . Alum & Mag Hydroxide-Simeth (MAGIC MOUTHWASH W/LIDOCAINE) SOLN Swish and spit out 10 ml by mouth up to 4 times daily  240 mL  0  . atorvastatin (LIPITOR) 20 MG tablet Take 1 tablet (20 mg total) by mouth daily.  90 tablet  3  . bumetanide (BUMEX) 1 MG tablet Take 1 tablet (1 mg total) by mouth 2 (two) times daily.  180 tablet  3  . buPROPion (WELLBUTRIN) 75 MG tablet Take 1 tablet (75 mg total) by mouth 2 (two) times daily.  180 tablet  3  . Calcium Carbonate-Vitamin D (CALCIUM 600/VITAMIN D) 600-400 MG-UNIT per tablet Take 1 tablet by mouth 2 (two) times daily.        . calcium-vitamin D 185 MG TABS Take 185 mg by mouth daily.        . cholecalciferol (VITAMIN D) 1000 UNITS tablet Take  1 tablet (1,000 Units total) by mouth daily.  30 tablet  11  . cyclobenzaprine (FLEXERIL) 10 MG tablet Take 10 mg by mouth 2 (two) times daily as needed. As needed for back pain       . diclofenac sodium (VOLTAREN) 1 % GEL Apply as needed  100 Tube  3  . esomeprazole (NEXIUM) 40 MG capsule Take 1 capsule (40 mg total) by mouth daily.  90 capsule  3  . fexofenadine (ALLEGRA) 60 MG tablet Take 60 mg by mouth 2 (two) times daily.        . Fish Oil-Cholecalciferol (OMEGA-3 FISH OIL/VITAMIN D3) 1000-1000 MG-UNIT CAPS Take by mouth. Take one by mouth daily       . fluticasone (FLONASE) 50 MCG/ACT nasal spray Place 2 sprays into the nose daily.  48 g  1  . Fluticasone-Salmeterol (ADVAIR DISKUS) 100-50 MCG/DOSE AEPB Inhale 1 puff into the lungs 2 (two) times daily.        Marland Kitchen HYDROcodone-acetaminophen (VICODIN)  5-500 MG per tablet Take 1 tablet by mouth every 6 (six) hours as needed.        Marland Kitchen levothyroxine (SYNTHROID, LEVOTHROID) 50 MCG tablet Take 1 tablet (50 mcg total) by mouth daily.  90 tablet  3  . potassium chloride (MICRO-K) 10 MEQ CR capsule Take 1 capsule (10 mEq total) by mouth daily.  90 capsule  3  . quinapril (ACCUPRIL) 10 MG tablet Take 1 tablet (10 mg total) by mouth daily.  90 tablet  3  . triamcinolone (KENALOG) 0.1 % paste Apply film to area twice daily.  5 g  0  . vitamin E 1000 UNIT capsule Take 1,000 Units by mouth daily.          The PMH, PSH, Social History, Family History, Medications, and allergies have been reviewed in [facility], and have been updated if relevant.  ROS: See HPI Patient reports no  vision/ hearing changes,anorexia, weight change, fever ,adenopathy, persistant / recurrent hoarseness, swallowing issues, chest pain, edema,persistant / recurrent cough, hemoptysis, dyspnea(rest, exertional, paroxysmal nocturnal), gastrointestinal  bleeding (melena, rectal bleeding), abdominal pain, excessive heart burn, GU symptoms(dysuria, hematuria, pyuria, voiding/incontinence  Issues) syncope, focal weakness, severe memory loss, concerning skin lesions, depression, anxiety, abnormal bruising/bleeding, major joint swelling, breast masses or abnormal vaginal bleeding.     Physical exam: BP 102/68  Pulse 80  Temp(Src) 97.6 F (36.4 C) (Oral)  Ht 5' 3.5" (1.613 m)  Wt 132 lb (59.875 kg)  BMI 23.02 kg/m2   General:  Well-developed,well-nourished,in no acute distress; alert,appropriate and cooperative throughout examination Head:  normocephalic and atraumatic.   Eyes:  vision grossly intact, pupils equal, pupils round, and pupils reactive to light.   Ears:  R ear normal and L ear normal.   Nose:  no external deformity.   Mouth:  good dentition.   Neck:  No deformities, masses, or tenderness noted. Lungs:  Normal respiratory effort, chest expands symmetrically. Lungs are clear to  auscultation, no crackles or wheezes. Heart:  Normal rate and regular rhythm. S1 and S2 normal without gallop, murmur, click, rub or other extra sounds. Abdomen:  Bowel sounds positive,abdomen soft and non-tender without masses, organomegaly or hernias noted. Msk:  No deformity or scoliosis noted of thoracic or lumbar spine.   Extremities:  No clubbing, cyanosis, edema, or deformity noted with normal full range of motion of all joints.   Neurologic:  alert & oriented X3 and gait normal.   Skin:  Intact without suspicious lesions or rashes  Cervical Nodes:  No lymphadenopathy noted Psych:  Cognition and judgment appear intact. Alert and cooperative with normal attention span and concentration. No apparent delusions, illusions, hallucination  Assessment and Plan: 1. Routine general medical examination at a health care facility  The patients weight, height, BMI and visual acuity have been recorded in the chart I have made referrals, counseling and provided education to the patient based review of the above and I have provided the pt with a written personalized care plan for preventive services.    2. OSTEOPENIA  DG Bone Density  3. HYPOTHYROIDISM  Stable.   4. HYPERTENSION  Normotensive.   5. Post menopausal problems  DG Bone Density  6. Screening for colon cancer  Fecal occult blood, imunochemical

## 2012-04-08 NOTE — Patient Instructions (Signed)
Great to see you. Please stop by to see Patricia Peck on your way out to set up your bone density scan. Please say hello to your husband for me.

## 2012-04-12 ENCOUNTER — Ambulatory Visit (INDEPENDENT_AMBULATORY_CARE_PROVIDER_SITE_OTHER)
Admission: RE | Admit: 2012-04-12 | Discharge: 2012-04-12 | Disposition: A | Discharge status: Home / Self Care | Payer: Medicare Other | Source: Ambulatory Visit

## 2012-04-12 DIAGNOSIS — M899 Disorder of bone, unspecified: Secondary | ICD-10-CM

## 2012-04-12 DIAGNOSIS — N959 Unspecified menopausal and perimenopausal disorder: Secondary | ICD-10-CM

## 2012-04-12 DIAGNOSIS — Z780 Asymptomatic menopausal state: Secondary | ICD-10-CM

## 2012-04-12 DIAGNOSIS — M949 Disorder of cartilage, unspecified: Secondary | ICD-10-CM

## 2012-04-13 ENCOUNTER — Encounter: Payer: Self-pay | Admitting: Family Medicine

## 2012-04-13 ENCOUNTER — Encounter: Payer: Self-pay | Admitting: *Deleted

## 2012-04-13 ENCOUNTER — Other Ambulatory Visit: Payer: Medicare Other

## 2012-04-13 DIAGNOSIS — E7800 Pure hypercholesterolemia, unspecified: Secondary | ICD-10-CM

## 2012-04-13 DIAGNOSIS — Z1211 Encounter for screening for malignant neoplasm of colon: Secondary | ICD-10-CM

## 2012-04-13 LAB — FECAL OCCULT BLOOD, IMMUNOCHEMICAL: Fecal Occult Bld: NEGATIVE

## 2012-06-14 ENCOUNTER — Telehealth: Payer: Self-pay

## 2012-06-14 NOTE — Telephone Encounter (Signed)
Pt request PA for Nexium I called [phone] option [id number]; spoke with Larita Fife she will fax PA, after form filled out can call and PA done over the phone otherwise fax form back in; 3-5 days processing.

## 2012-06-15 NOTE — Telephone Encounter (Signed)
Form completed and on my desk. 

## 2012-06-15 NOTE — Telephone Encounter (Signed)
Prior auth form for nexium is on your desk.

## 2012-06-15 NOTE — Telephone Encounter (Signed)
Form faxed

## 2012-06-17 NOTE — Telephone Encounter (Signed)
Prior auth given for nexium, approval letter placed on doctor's desk for signature and scanning. 

## 2012-07-06 ENCOUNTER — Other Ambulatory Visit: Payer: Self-pay

## 2012-07-06 MED ORDER — FLUTICASONE-SALMETEROL 100-50 MCG/DOSE IN AEPB: 1.0000 | INHALATION_SPRAY | Freq: Two times a day (BID) | RESPIRATORY_TRACT | Status: DC

## 2012-07-06 NOTE — Telephone Encounter (Signed)
Pt request refill on Advair 100-50. Pt has not needed for awhile but pt needs refill now.Please advise.

## 2012-07-29 ENCOUNTER — Other Ambulatory Visit: Payer: Self-pay

## 2012-07-29 MED ORDER — LEVOTHYROXINE SODIUM 50 MCG PO TABS: 50.0000 ug | ORAL_TABLET | Freq: Every day | ORAL | Status: DC

## 2012-07-29 MED ORDER — FLUTICASONE PROPIONATE 50 MCG/ACT NA SUSP: 2.0000 | Freq: Every day | NASAL | Status: DC

## 2012-07-29 MED ORDER — BUPROPION HCL 75 MG PO TABS: 75.0000 mg | ORAL_TABLET | Freq: Two times a day (BID) | ORAL | Status: DC

## 2012-07-29 MED ORDER — FLUTICASONE-SALMETEROL 100-50 MCG/DOSE IN AEPB: 1.0000 | INHALATION_SPRAY | Freq: Two times a day (BID) | RESPIRATORY_TRACT | Status: DC

## 2012-07-29 MED ORDER — QUINAPRIL HCL 10 MG PO TABS: 10.0000 mg | ORAL_TABLET | Freq: Every day | ORAL | Status: DC

## 2012-07-29 MED ORDER — BUMETANIDE 1 MG PO TABS: 1.0000 mg | ORAL_TABLET | Freq: Two times a day (BID) | ORAL | Status: DC

## 2012-07-29 MED ORDER — ESOMEPRAZOLE MAGNESIUM 40 MG PO CPDR: 40.0000 mg | DELAYED_RELEASE_CAPSULE | Freq: Every day | ORAL | Status: DC

## 2012-07-29 MED ORDER — ATORVASTATIN CALCIUM 20 MG PO TABS: 20.0000 mg | ORAL_TABLET | Freq: Every day | ORAL | Status: DC

## 2012-07-29 MED ORDER — POTASSIUM CHLORIDE ER 10 MEQ PO CPCR: 10.0000 meq | ORAL_CAPSULE | Freq: Every day | ORAL | Status: DC

## 2012-07-29 NOTE — Telephone Encounter (Signed)
Pt request [facility] month [facility]. Pt said gets 3 bottles of Flonase nasal spray for 3 month period and 3 inhalers for 3 month period for Advair.Please advise.

## 2012-07-29 NOTE — Telephone Encounter (Signed)
Pt request refills Nexium,atorvastatin,bumex,bupropion,quanapril,levothyroxine,and K [facility]. Pt notified done.

## 2012-08-04 ENCOUNTER — Other Ambulatory Visit: Payer: Self-pay

## 2012-08-04 MED ORDER — FLUTICASONE-SALMETEROL 100-50 MCG/DOSE IN AEPB: 1.0000 | INHALATION_SPRAY | Freq: Two times a day (BID) | RESPIRATORY_TRACT | Status: DC

## 2012-08-04 NOTE — Telephone Encounter (Signed)
Pt left v/m last time [facility] only one diskus was sent. Pt said she needs 3 diskus for 3 month supply. Unable to get dispense to change from each under meds & orders (last time sent 60 each). Dee advised to send to Dr Dayton Martes.Please advise.

## 2012-08-10 ENCOUNTER — Ambulatory Visit: Payer: Medicare Other

## 2012-08-11 ENCOUNTER — Ambulatory Visit (INDEPENDENT_AMBULATORY_CARE_PROVIDER_SITE_OTHER): Payer: Medicare Other

## 2012-08-11 DIAGNOSIS — Z23 Encounter for immunization: Secondary | ICD-10-CM

## 2012-09-07 ENCOUNTER — Encounter: Payer: Self-pay | Admitting: Family Medicine

## 2012-09-07 ENCOUNTER — Ambulatory Visit (INDEPENDENT_AMBULATORY_CARE_PROVIDER_SITE_OTHER): Payer: Medicare Other | Admitting: Family Medicine

## 2012-09-07 VITALS — BP 150/82 | HR 76 | Temp 97.8°F | Wt 134.0 lb

## 2012-09-07 DIAGNOSIS — R1013 Epigastric pain: Secondary | ICD-10-CM

## 2012-09-07 DIAGNOSIS — K3189 Other diseases of stomach and duodenum: Secondary | ICD-10-CM

## 2012-09-07 NOTE — Progress Notes (Signed)
Subjective:    Patient ID: Patricia Peck, female    DOB: 1946/07/12, 66 y.o.   MRN: [id number]  HPI  66 yo here for worsening heartburn x 1 month.  She takes Nexium 40 mg daily.  Past few months, increased stressors- husband has been ill, they are no longer allowed to play with their tennis league because of their age.  She does drink wine and has eaten pizza last couple of weeks.  She did notice burning in her throat/acid taste was much worse after she ate pizza.  Denies any difficulty swallowing solids or liquids, abdominal pain, changes in her stools, blood in her stool or dark stools.  No weight loss or fevers.  Wt Readings from Last 3 Encounters:  09/07/12 134 lb (60.782 kg)  04/08/12 132 lb (59.875 kg)  02/24/12 129 lb (58.514 kg)   Patient Active Problem List  Diagnosis  . NEOPLASM, SKIN, UNCERTAIN BEHAVIOR  . HYPOTHYROIDISM  . DEPRESSION  . OTITIS MEDIA, SEROUS, CHRONIC  . HYPERTENSION  . ALLERGIC RHINITIS  . GERD  . CONTACT DERMATITIS  . PAIN IN JOINT, ANKLE/FOOT  . OTHER ENTHESOPATHY OF ANKLE AND TARSUS  . OSTEOPENIA  . LOCALIZED SUPERFICIAL SWELLING MASS OR LUMP  . RIB PAIN, RIGHT SIDED  . Routine general medical examination at a health care facility  . Failed hearing screening  . URI (upper respiratory infection)  . Oral aphthous ulcer   Past Medical History  Diagnosis Date  . Allergy   . Depression   . Hypertension   . Osteoporosis   . Hypothyroidism   . Asthma   . GERD (gastroesophageal reflux disease)    Past Surgical History  Procedure Date  . Partial hysterectomy 1989  . Colonoscopy    History  Substance Use Topics  . Smoking status: Never Smoker   . Smokeless tobacco: Not on file  . Alcohol Use: 1.0 oz/week    2 drink(s) per week   Family History  Problem Relation Age of Onset  . Cancer Mother   . Drug abuse Son   . Depression Son   . Stroke Paternal Aunt   . Heart disease Paternal Grandfather   . Diabetes Maternal Grandfather      No Known Allergies Current Outpatient Prescriptions on File Prior to Visit  Medication Sig Dispense Refill  . albuterol (PROAIR HFA) 108 (90 BASE) MCG/ACT inhaler Inhale 2 puffs into the lungs. Every 4-6 hours       . Alum & Mag Hydroxide-Simeth (MAGIC MOUTHWASH W/LIDOCAINE) SOLN Swish and spit out 10 ml by mouth up to 4 times daily  240 mL  0  . atorvastatin (LIPITOR) 20 MG tablet Take 1 tablet (20 mg total) by mouth daily.  90 tablet  3  . bumetanide (BUMEX) 1 MG tablet Take 1 tablet (1 mg total) by mouth 2 (two) times daily.  180 tablet  2  . buPROPion (WELLBUTRIN) 75 MG tablet Take 1 tablet (75 mg total) by mouth 2 (two) times daily.  180 tablet  2  . Calcium Carbonate-Vitamin D (CALCIUM 600/VITAMIN D) 600-400 MG-UNIT per tablet Take 1 tablet by mouth 2 (two) times daily.        . calcium-vitamin D 185 MG TABS Take 185 mg by mouth daily.        . cholecalciferol (VITAMIN D) 1000 UNITS tablet Take 1 tablet (1,000 Units total) by mouth daily.  30 tablet  11  . cyclobenzaprine (FLEXERIL) 10 MG tablet Take 10 mg by mouth  2 (two) times daily as needed. As needed for back pain       . diclofenac sodium (VOLTAREN) 1 % GEL Apply as needed  100 Tube  3  . esomeprazole (NEXIUM) 40 MG capsule Take 1 capsule (40 mg total) by mouth daily.  90 capsule  2  . fexofenadine (ALLEGRA) 60 MG tablet Take 60 mg by mouth 2 (two) times daily.        . fluticasone (FLONASE) 50 MCG/ACT nasal spray Place 2 sprays into the nose daily.  48 g  1  . Fluticasone-Salmeterol (ADVAIR DISKUS) 100-50 MCG/DOSE AEPB Inhale 1 puff into the lungs 2 (two) times daily.  3 each  3  . HYDROcodone-acetaminophen (VICODIN) 5-500 MG per tablet Take 1 tablet by mouth every 6 (six) hours as needed.        Marland Kitchen levothyroxine (SYNTHROID, LEVOTHROID) 50 MCG tablet Take 1 tablet (50 mcg total) by mouth daily.  90 tablet  2  . potassium chloride (MICRO-K) 10 MEQ CR capsule Take 1 capsule (10 mEq total) by mouth daily.  90 capsule  2  . quinapril  (ACCUPRIL) 10 MG tablet Take 1 tablet (10 mg total) by mouth daily.  90 tablet  2  . vitamin E 1000 UNIT capsule Take 1,000 Units by mouth daily.         The PMH, PSH, Social History, Family History, Medications, and allergies have been reviewed in [facility], and have been updated if relevant.   Review of Systems See HPI    Objective:   Physical Exam BP 150/82  Pulse 76  Temp 97.8 F (36.6 C)  Wt 134 lb (60.782 kg)  General:  Well-developed,well-nourished,in no acute distress; alert,appropriate and cooperative throughout examination Head:  normocephalic and atraumatic.   Abdomen:  Bowel sounds positive,abdomen soft and non-tender without masses, organomegaly or hernias noted. Msk:  No deformity or scoliosis noted of thoracic or lumbar spine.   Extremities:  No clubbing, cyanosis, edema, or deformity noted with normal full range of motion of all joints.   Neurologic:  alert & oriented X3 and gait normal.   Skin:  Intact without suspicious lesions or rashes Psych:  Cognition and judgment appear intact. Alert and cooperative with normal attention span and concentration. No apparent delusions, illusions, hallucinations     Assessment & Plan:  1.  GERD-  Deteriorated.  No red flag symptoms or abnormal findings on exam.  Likely due to dietary triggers and increased stress.  Will add Pepcid 20 mg daily x 2 weeks to her daily Nexium. Also will advise avoiding triggers- see pt instructions for details.  If symptoms persist, switch to protonix and refer to GI for endoscopy.  The patient indicates understanding of these issues and agrees with the plan.

## 2012-09-07 NOTE — Patient Instructions (Addendum)
Let's add Pepcid 20 mg daily x 2 weeks to your Nexium.   Avoid foods and drinks that make your symptoms worse, such as: Caffeine or alcoholic drinks. Chocolate. Peppermint or mint flavorings. Garlic and onions. Spicy foods. Citrus fruits, such as oranges, lemons, or limes. Tomato-based foods such as sauce, chili, salsa, and pizza. Fried and fatty foods. Avoid eating for the 3 hours prior to your bedtime. Eat small, frequent meals instead of large meals. Stop smoking if you smoke. Maintain a healthy weight. Wear loose-fitting clothing. Do not wear anything tight around your waist that causes pressure on your stomach. Raise the head of your bed 4 to 8 inches with wood blocks to help you sleep. Extra pillows will not help. Only take over-the-counter or prescription medicines as directed by your caregiver. Do not take aspirin, ibuprofen, or other nonsteroidal anti-inflammatory drugs (NSAIDs).

## 2012-10-01 ENCOUNTER — Ambulatory Visit (INDEPENDENT_AMBULATORY_CARE_PROVIDER_SITE_OTHER): Payer: Medicare Other | Admitting: Family Medicine

## 2012-10-01 ENCOUNTER — Encounter: Payer: Self-pay | Admitting: Family Medicine

## 2012-10-01 VITALS — BP 104/64 | HR 76 | Temp 97.7°F | Wt 131.0 lb

## 2012-10-01 DIAGNOSIS — J069 Acute upper respiratory infection, unspecified: Secondary | ICD-10-CM

## 2012-10-01 MED ORDER — HYDROCODONE-HOMATROPINE 5-1.5 MG/5ML PO SYRP: 5.0000 mL | ORAL_SOLUTION | Freq: Three times a day (TID) | ORAL | Status: DC | PRN

## 2012-10-01 MED ORDER — AMOXICILLIN 875 MG PO TABS: 875.0000 mg | ORAL_TABLET | Freq: Two times a day (BID) | ORAL | Status: DC

## 2012-10-01 NOTE — Patient Instructions (Addendum)
This is likely virus. Keep using flonase and your breathing inhaler. You may add Mucinex. Hycodan as needed for cough at night.  Fill your Amoxicillin if you are not improving over next several days. Have a nice weekend.

## 2012-10-01 NOTE — Progress Notes (Signed)
SUBJECTIVE:  Patricia Peck is a 66 y.o. female who complains of coryza, congestion, sneezing, sore throat and bilateral sinus pain for 3 days. She denies a history of anorexia, chest pain, fatigue, myalgias, nausea and shortness of breath and denies a history of asthma. Patient denies smoke cigarettes.   Patient Active Problem List  Diagnosis  . NEOPLASM, SKIN, UNCERTAIN BEHAVIOR  . HYPOTHYROIDISM  . DEPRESSION  . OTITIS MEDIA, SEROUS, CHRONIC  . HYPERTENSION  . ALLERGIC RHINITIS  . GERD  . CONTACT DERMATITIS  . PAIN IN JOINT, ANKLE/FOOT  . OTHER ENTHESOPATHY OF ANKLE AND TARSUS  . OSTEOPENIA  . LOCALIZED SUPERFICIAL SWELLING MASS OR LUMP  . RIB PAIN, RIGHT SIDED  . Routine general medical examination at a health care facility  . Failed hearing screening  . URI (upper respiratory infection)  . Oral aphthous ulcer   Past Medical History  Diagnosis Date  . Allergy   . Depression   . Hypertension   . Osteoporosis   . Hypothyroidism   . Asthma   . GERD (gastroesophageal reflux disease)    Past Surgical History  Procedure Date  . Partial hysterectomy 1989  . Colonoscopy    History  Substance Use Topics  . Smoking status: Never Smoker   . Smokeless tobacco: Not on file  . Alcohol Use: 1.0 oz/week    2 drink(s) per week   Family History  Problem Relation Age of Onset  . Cancer Mother   . Drug abuse Son   . Depression Son   . Stroke Paternal Aunt   . Heart disease Paternal Grandfather   . Diabetes Maternal Grandfather    No Known Allergies Current Outpatient Prescriptions on File Prior to Visit  Medication Sig Dispense Refill  . albuterol (PROAIR HFA) 108 (90 BASE) MCG/ACT inhaler Inhale 2 puffs into the lungs. Every 4-6 hours       . Alum & Mag Hydroxide-Simeth (MAGIC MOUTHWASH W/LIDOCAINE) SOLN Swish and spit out 10 ml by mouth up to 4 times daily  240 mL  0  . atorvastatin (LIPITOR) 20 MG tablet Take 1 tablet (20 mg total) by mouth daily.  90 tablet  3  .  bumetanide (BUMEX) 1 MG tablet Take 1 tablet (1 mg total) by mouth 2 (two) times daily.  180 tablet  2  . buPROPion (WELLBUTRIN) 75 MG tablet Take 1 tablet (75 mg total) by mouth 2 (two) times daily.  180 tablet  2  . Calcium Carbonate-Vitamin D (CALCIUM 600/VITAMIN D) 600-400 MG-UNIT per tablet Take 1 tablet by mouth 2 (two) times daily.        . calcium-vitamin D 185 MG TABS Take 185 mg by mouth daily.        . cholecalciferol (VITAMIN D) 1000 UNITS tablet Take 1 tablet (1,000 Units total) by mouth daily.  30 tablet  11  . cyclobenzaprine (FLEXERIL) 10 MG tablet Take 10 mg by mouth 2 (two) times daily as needed. As needed for back pain       . diclofenac sodium (VOLTAREN) 1 % GEL Apply as needed  100 Tube  3  . esomeprazole (NEXIUM) 40 MG capsule Take 1 capsule (40 mg total) by mouth daily.  90 capsule  2  . fexofenadine (ALLEGRA) 60 MG tablet Take 60 mg by mouth 2 (two) times daily.        . fluticasone (FLONASE) 50 MCG/ACT nasal spray Place 2 sprays into the nose daily.  48 g  1  .  Fluticasone-Salmeterol (ADVAIR DISKUS) 100-50 MCG/DOSE AEPB Inhale 1 puff into the lungs 2 (two) times daily.  3 each  3  . HYDROcodone-acetaminophen (VICODIN) 5-500 MG per tablet Take 1 tablet by mouth every 6 (six) hours as needed.        Marland Kitchen levothyroxine (SYNTHROID, LEVOTHROID) 50 MCG tablet Take 1 tablet (50 mcg total) by mouth daily.  90 tablet  2  . potassium chloride (MICRO-K) 10 MEQ CR capsule Take 1 capsule (10 mEq total) by mouth daily.  90 capsule  2  . quinapril (ACCUPRIL) 10 MG tablet Take 1 tablet (10 mg total) by mouth daily.  90 tablet  2  . vitamin E 1000 UNIT capsule Take 1,000 Units by mouth daily.         The PMH, PSH, Social History, Family History, Medications, and allergies have been reviewed in [facility], and have been updated if relevant.   OBJECTIVE: BP 104/64  Pulse 76  Temp 97.7 F (36.5 C) (Oral)  Wt 131 lb (59.421 kg)  SpO2 98%  She appears well, vital signs are as noted. Ears normal.   Throat and pharynx normal.  Neck supple. No adenopathy in the neck. Nose is congested. Sinuses non tender. The chest is clear, without wheezes or rales.  ASSESSMENT:  viral upper respiratory illness  PLAN: Symptomatic therapy suggested: push fluids, rest and return office visit prn if symptoms persist or worsen. Lack of antibiotic effectiveness discussed with her. Call or return to clinic prn if these symptoms worsen or fail to improve as anticipated.

## 2012-10-11 ENCOUNTER — Encounter: Payer: Self-pay | Admitting: Family Medicine

## 2012-10-11 ENCOUNTER — Ambulatory Visit (INDEPENDENT_AMBULATORY_CARE_PROVIDER_SITE_OTHER): Payer: Medicare Other | Admitting: Family Medicine

## 2012-10-11 VITALS — BP 112/70 | HR 60 | Temp 97.5°F | Wt 129.0 lb

## 2012-10-11 DIAGNOSIS — J329 Chronic sinusitis, unspecified: Secondary | ICD-10-CM | POA: Insufficient documentation

## 2012-10-11 MED ORDER — AZITHROMYCIN 250 MG PO TABS: ORAL_TABLET | ORAL | Status: AC

## 2012-10-11 NOTE — Progress Notes (Signed)
SUBJECTIVE:  Patricia Peck is a 66 y.o. female here for follow up.   I saw her on 11/29 for coryza, congestion, sneezing, sore throat and bilateral sinus pain for 3 days. She denied a history of anorexia, chest pain, fatigue, myalgias, nausea and shortness of breath and denies a history of asthma.   Advised likely viral but given rx for amoxicillin to fill if symptoms progressed.  She started the amoxicillin next day.  She is here today because sinus pressure has worsened.  Still with cough and drainage. No fever.  Mouth very sore- restarted Nystatin mouth wash and symptoms resolved.  Patient Active Problem List  Diagnosis  . NEOPLASM, SKIN, UNCERTAIN BEHAVIOR  . HYPOTHYROIDISM  . DEPRESSION  . OTITIS MEDIA, SEROUS, CHRONIC  . HYPERTENSION  . ALLERGIC RHINITIS  . GERD  . CONTACT DERMATITIS  . PAIN IN JOINT, ANKLE/FOOT  . OTHER ENTHESOPATHY OF ANKLE AND TARSUS  . OSTEOPENIA  . LOCALIZED SUPERFICIAL SWELLING MASS OR LUMP  . RIB PAIN, RIGHT SIDED  . Routine general medical examination at a health care facility  . Failed hearing screening  . URI (upper respiratory infection)  . Oral aphthous ulcer  . Sinusitis   Past Medical History  Diagnosis Date  . Allergy   . Depression   . Hypertension   . Osteoporosis   . Hypothyroidism   . Asthma   . GERD (gastroesophageal reflux disease)    Past Surgical History  Procedure Date  . Partial hysterectomy 1989  . Colonoscopy    History  Substance Use Topics  . Smoking status: Never Smoker   . Smokeless tobacco: Not on file  . Alcohol Use: 1.0 oz/week    2 drink(s) per week   Family History  Problem Relation Age of Onset  . Cancer Mother   . Drug abuse Son   . Depression Son   . Stroke Paternal Aunt   . Heart disease Paternal Grandfather   . Diabetes Maternal Grandfather    No Known Allergies Current Outpatient Prescriptions on File Prior to Visit  Medication Sig Dispense Refill  . albuterol (PROAIR HFA) 108 (90  BASE) MCG/ACT inhaler Inhale 2 puffs into the lungs. Every 4-6 hours       . Alum & Mag Hydroxide-Simeth (MAGIC MOUTHWASH W/LIDOCAINE) SOLN Swish and spit out 10 ml by mouth up to 4 times daily  240 mL  0  . amoxicillin (AMOXIL) 875 MG tablet Take 1 tablet (875 mg total) by mouth 2 (two) times daily.  20 tablet  0  . atorvastatin (LIPITOR) 20 MG tablet Take 1 tablet (20 mg total) by mouth daily.  90 tablet  3  . bumetanide (BUMEX) 1 MG tablet Take 1 tablet (1 mg total) by mouth 2 (two) times daily.  180 tablet  2  . buPROPion (WELLBUTRIN) 75 MG tablet Take 1 tablet (75 mg total) by mouth 2 (two) times daily.  180 tablet  2  . Calcium Carbonate-Vitamin D (CALCIUM 600/VITAMIN D) 600-400 MG-UNIT per tablet Take 1 tablet by mouth 2 (two) times daily.        . calcium-vitamin D 185 MG TABS Take 185 mg by mouth daily.        . cholecalciferol (VITAMIN D) 1000 UNITS tablet Take 1 tablet (1,000 Units total) by mouth daily.  30 tablet  11  . cyclobenzaprine (FLEXERIL) 10 MG tablet Take 10 mg by mouth 2 (two) times daily as needed. As needed for back pain       .  diclofenac sodium (VOLTAREN) 1 % GEL Apply as needed  100 Tube  3  . esomeprazole (NEXIUM) 40 MG capsule Take 1 capsule (40 mg total) by mouth daily.  90 capsule  2  . fexofenadine (ALLEGRA) 60 MG tablet Take 60 mg by mouth 2 (two) times daily.        . fluticasone (FLONASE) 50 MCG/ACT nasal spray Place 2 sprays into the nose daily.  48 g  1  . Fluticasone-Salmeterol (ADVAIR DISKUS) 100-50 MCG/DOSE AEPB Inhale 1 puff into the lungs 2 (two) times daily.  3 each  3  . HYDROcodone-acetaminophen (VICODIN) 5-500 MG per tablet Take 1 tablet by mouth every 6 (six) hours as needed.        Marland Kitchen HYDROcodone-homatropine (HYCODAN) 5-1.5 MG/5ML syrup Take 5 mLs by mouth every 8 (eight) hours as needed for cough.  120 mL  0  . levothyroxine (SYNTHROID, LEVOTHROID) 50 MCG tablet Take 1 tablet (50 mcg total) by mouth daily.  90 tablet  2  . potassium chloride  (MICRO-K) 10 MEQ CR capsule Take 1 capsule (10 mEq total) by mouth daily.  90 capsule  2  . quinapril (ACCUPRIL) 10 MG tablet Take 1 tablet (10 mg total) by mouth daily.  90 tablet  2  . vitamin E 1000 UNIT capsule Take 1,000 Units by mouth daily.         The PMH, PSH, Social History, Family History, Medications, and allergies have been reviewed in [facility], and have been updated if relevant.   OBJECTIVE: BP 112/70  Pulse 60  Temp 97.5 F (36.4 C)  Wt 129 lb (58.514 kg)  She appears well, vital signs are as noted. Ears normal.  Throat and pharynx normal.  Neck supple. No adenopathy in the neck. Nose is congested. Frontal and maxillary sinuses TTP. The chest is clear, without wheezes or rales.  ASSESSMENT:  sinusitis  PLAN: Now bacterial- failed amoxicillin. Start Zpack.  Symptomatic therapy suggested: push fluids, rest and return office visit prn if symptoms persist or worsen. Call or return to clinic prn if these symptoms worsen or fail to improve as anticipated.

## 2012-10-11 NOTE — Patient Instructions (Addendum)
Try Melatonin over the counter.  Take Zpack as directed. Continue the magic mouthwash.  Call me later this week with an update.

## 2012-11-01 ENCOUNTER — Ambulatory Visit: Payer: Medicare Other | Admitting: Family Medicine

## 2012-11-02 ENCOUNTER — Ambulatory Visit (INDEPENDENT_AMBULATORY_CARE_PROVIDER_SITE_OTHER): Payer: Medicare Other | Admitting: Family Medicine

## 2012-11-02 ENCOUNTER — Encounter: Payer: Self-pay | Admitting: Family Medicine

## 2012-11-02 VITALS — BP 110/70 | HR 98 | Wt 130.0 lb

## 2012-11-02 DIAGNOSIS — G4700 Insomnia, unspecified: Secondary | ICD-10-CM | POA: Insufficient documentation

## 2012-11-02 MED ORDER — TRAZODONE HCL 50 MG PO TABS: 25.0000 mg | ORAL_TABLET | Freq: Every evening | ORAL | Status: DC | PRN

## 2012-11-02 NOTE — Patient Instructions (Addendum)
Good to see you. Hang in there.  Say hello to Joe for me.  We are starting Trazodone.  Call me in a few weeks with an update.

## 2012-11-02 NOTE — Progress Notes (Signed)
Subjective:    Patient ID: Patricia Peck, female    DOB: [birth year], 66 y.o.   MRN: [id number]  HPI  66 yo here for persistent insomnia.  Has been ongoing problem for years.  Tried Melatonin, didn't work.  Sometimes takes Benadryl but with recent stressors, this is not helping- husband has been quite ill.  Sleep hygeine is good- does not watch TV in bed, tries not to eat too late.     Denies feeling depressed but obviously worried about her husband.  Patient Active Problem List  Diagnosis  . NEOPLASM, SKIN, UNCERTAIN BEHAVIOR  . HYPOTHYROIDISM  . DEPRESSION  . OTITIS MEDIA, SEROUS, CHRONIC  . HYPERTENSION  . ALLERGIC RHINITIS  . GERD  . CONTACT DERMATITIS  . PAIN IN JOINT, ANKLE/FOOT  . OTHER ENTHESOPATHY OF ANKLE AND TARSUS  . OSTEOPENIA  . LOCALIZED SUPERFICIAL SWELLING MASS OR LUMP  . RIB PAIN, RIGHT SIDED  . Routine general medical examination at a health care facility  . Failed hearing screening  . URI (upper respiratory infection)  . Oral aphthous ulcer  . Sinusitis  . Insomnia   Past Medical History  Diagnosis Date  . Allergy   . Depression   . Hypertension   . Osteoporosis   . Hypothyroidism   . Asthma   . GERD (gastroesophageal reflux disease)    Past Surgical History  Procedure Date  . Partial hysterectomy 1989  . Colonoscopy    History  Substance Use Topics  . Smoking status: Never Smoker   . Smokeless tobacco: Not on file  . Alcohol Use: 1.0 oz/week    2 drink(s) per week   Family History  Problem Relation Age of Onset  . Cancer Mother   . Drug abuse Son   . Depression Son   . Stroke Paternal Aunt   . Heart disease Paternal Grandfather   . Diabetes Maternal Grandfather    No Known Allergies Current Outpatient Prescriptions on File Prior to Visit  Medication Sig Dispense Refill  . albuterol (PROAIR HFA) 108 (90 BASE) MCG/ACT inhaler Inhale 2 puffs into the lungs. Every 4-6 hours       . Alum & Mag Hydroxide-Simeth (MAGIC MOUTHWASH  W/LIDOCAINE) SOLN Swish and spit out 10 ml by mouth up to 4 times daily  240 mL  0  . atorvastatin (LIPITOR) 20 MG tablet Take 1 tablet (20 mg total) by mouth daily.  90 tablet  3  . bumetanide (BUMEX) 1 MG tablet Take 1 tablet (1 mg total) by mouth 2 (two) times daily.  180 tablet  2  . buPROPion (WELLBUTRIN) 75 MG tablet Take 1 tablet (75 mg total) by mouth 2 (two) times daily.  180 tablet  2  . Calcium Carbonate-Vitamin D (CALCIUM 600/VITAMIN D) 600-400 MG-UNIT per tablet Take 1 tablet by mouth 2 (two) times daily.        . calcium-vitamin D 185 MG TABS Take 185 mg by mouth daily.        . cholecalciferol (VITAMIN D) 1000 UNITS tablet Take 1 tablet (1,000 Units total) by mouth daily.  30 tablet  11  . cyclobenzaprine (FLEXERIL) 10 MG tablet Take 10 mg by mouth 2 (two) times daily as needed. As needed for back pain       . diclofenac sodium (VOLTAREN) 1 % GEL Apply as needed  100 Tube  3  . esomeprazole (NEXIUM) 40 MG capsule Take 1 capsule (40 mg total) by mouth daily.  90 capsule  2  .  fexofenadine (ALLEGRA) 60 MG tablet Take 60 mg by mouth 2 (two) times daily.        . fluticasone (FLONASE) 50 MCG/ACT nasal spray Place 2 sprays into the nose daily.  48 g  1  . Fluticasone-Salmeterol (ADVAIR DISKUS) 100-50 MCG/DOSE AEPB Inhale 1 puff into the lungs 2 (two) times daily.  3 each  3  . HYDROcodone-acetaminophen (VICODIN) 5-500 MG per tablet Take 1 tablet by mouth every 6 (six) hours as needed.        Marland Kitchen levothyroxine (SYNTHROID, LEVOTHROID) 50 MCG tablet Take 1 tablet (50 mcg total) by mouth daily.  90 tablet  2  . potassium chloride (MICRO-K) 10 MEQ CR capsule Take 1 capsule (10 mEq total) by mouth daily.  90 capsule  2  . quinapril (ACCUPRIL) 10 MG tablet Take 1 tablet (10 mg total) by mouth daily.  90 tablet  2  . vitamin E 1000 UNIT capsule Take 1,000 Units by mouth daily.        . traZODone (DESYREL) 50 MG tablet Take 0.5-1 tablets (25-50 mg total) by mouth at bedtime as needed for sleep.  30  tablet  3   The PMH, PSH, Social History, Family History, Medications, and allergies have been reviewed in [facility], and have been updated if relevant.   Review of Systems See HPI    Objective:   Physical Exam BP 110/70  Pulse 98  Wt 130 lb (58.968 kg)  General:  Well-developed,well-nourished,in no acute distress; alert,appropriate and cooperative throughout examination Head:  normocephalic and atraumatic.   Abdomen:  Bowel sounds positive,abdomen soft and non-tender without masses, organomegaly or hernias noted. Msk:  No deformity or scoliosis noted of thoracic or lumbar spine.   Extremities:  No clubbing, cyanosis, edema, or deformity noted with normal full range of motion of all joints.   Neurologic:  alert & oriented X3 and gait normal.   Skin:  Intact without suspicious lesions or rashes Psych:  Cognition and judgment appear intact. Alert and cooperative with normal attention span and concentration. No apparent delusions, illusions, hallucinations     Assessment & Plan:   1. Insomnia    >15 min spent with face to face with patient, >50% counseling and/or coordinating care. Discussed sleep aids and adverse effects of many of them.  I would like to try Trazadone first.  Pt agrees with plan. See pt instructions for details.

## 2012-11-04 ENCOUNTER — Telehealth: Payer: Self-pay

## 2012-11-04 NOTE — Telephone Encounter (Signed)
Pt left v/m changing [facility]ies [facility]. [facility]  [facility] day refills next week just wanted Dr Dayton Martes to be aware of pharmacy change.

## 2012-11-05 ENCOUNTER — Other Ambulatory Visit: Payer: Self-pay | Admitting: *Deleted

## 2012-11-05 MED ORDER — ATORVASTATIN CALCIUM 20 MG PO TABS: 20.0000 mg | ORAL_TABLET | Freq: Every day | ORAL | Status: DC

## 2012-11-05 MED ORDER — BUMETANIDE 1 MG PO TABS: 1.0000 mg | ORAL_TABLET | Freq: Two times a day (BID) | ORAL | Status: DC

## 2012-11-05 MED ORDER — POTASSIUM CHLORIDE ER 10 MEQ PO CPCR: 10.0000 meq | ORAL_CAPSULE | Freq: Every day | ORAL | Status: DC

## 2012-11-05 MED ORDER — BUPROPION HCL 75 MG PO TABS: 75.0000 mg | ORAL_TABLET | Freq: Two times a day (BID) | ORAL | Status: DC

## 2012-11-05 MED ORDER — FLUTICASONE PROPIONATE 50 MCG/ACT NA SUSP: 2.0000 | Freq: Every day | NASAL | Status: DC

## 2012-11-05 MED ORDER — TRAZODONE HCL 50 MG PO TABS: 25.0000 mg | ORAL_TABLET | Freq: Every evening | ORAL | Status: DC | PRN

## 2012-11-05 MED ORDER — DICLOFENAC SODIUM 1 % TD GEL: TRANSDERMAL | Status: DC

## 2012-11-05 MED ORDER — LEVOTHYROXINE SODIUM 50 MCG PO TABS: 50.0000 ug | ORAL_TABLET | Freq: Every day | ORAL | Status: DC

## 2012-11-05 MED ORDER — ESOMEPRAZOLE MAGNESIUM 40 MG PO CPDR: 40.0000 mg | DELAYED_RELEASE_CAPSULE | Freq: Every day | ORAL | Status: DC

## 2012-11-05 MED ORDER — FLUTICASONE-SALMETEROL 100-50 MCG/DOSE IN AEPB: 1.0000 | INHALATION_SPRAY | Freq: Two times a day (BID) | RESPIRATORY_TRACT | Status: DC

## 2012-11-05 MED ORDER — QUINAPRIL HCL 10 MG PO TABS: 10.0000 mg | ORAL_TABLET | Freq: Every day | ORAL | Status: DC

## 2012-11-05 NOTE — Telephone Encounter (Signed)
Faxed request for 90 day supply to [facility].

## 2012-11-10 ENCOUNTER — Telehealth: Payer: Self-pay

## 2012-11-10 ENCOUNTER — Other Ambulatory Visit: Payer: Self-pay | Admitting: Family Medicine

## 2012-11-10 NOTE — Telephone Encounter (Signed)
Ok to increase to two tablets nightly (100 mg total) and call us back with an update.

## 2012-11-10 NOTE — Telephone Encounter (Signed)
Pt seen 11/02/12; Tranzodone 50 mg taking one tab at bedtime is not helping insomnia. Pt can go to sleep but wakes up and then cannot go back to sleep.[facility] [address] Pt request call back.

## 2012-11-10 NOTE — Telephone Encounter (Signed)
Advised patient as instructed. 

## 2012-11-18 ENCOUNTER — Telehealth: Payer: Self-pay

## 2012-11-18 MED ORDER — TRAZODONE HCL 150 MG PO TABS: ORAL_TABLET | ORAL | Status: DC

## 2012-11-18 NOTE — Telephone Encounter (Addendum)
Pt left v/m taking trazodone 50 mg taking 2 tablets at bedtime with 2 melatonins; pt goes to sleep but does not stay asleep. Pt will run out of med this weekend and request stronger or different med for sleep. Pt did not leave pharmacy on v/m and does not answer home or cell phone.Please advise.pt called back and request med sent to [facility] [address]

## 2012-11-18 NOTE — Telephone Encounter (Signed)
Ok to increase trazodone to 150 mg qhs prn insomnia. #30, 1 refill.

## 2012-11-18 NOTE — Telephone Encounter (Signed)
Advised patient.  [facility]

## 2012-11-22 ENCOUNTER — Ambulatory Visit
Admission: RE | Admit: 2012-11-22 | Discharge: 2012-11-22 | Disposition: A | Discharge status: Home / Self Care | Payer: Medicare Other | Source: Ambulatory Visit | Attending: Family Medicine | Admitting: Family Medicine

## 2012-11-22 ENCOUNTER — Ambulatory Visit (INDEPENDENT_AMBULATORY_CARE_PROVIDER_SITE_OTHER): Payer: Medicare Other | Admitting: Family Medicine

## 2012-11-22 ENCOUNTER — Other Ambulatory Visit: Payer: Self-pay | Admitting: Family Medicine

## 2012-11-22 ENCOUNTER — Encounter: Payer: Self-pay | Admitting: Family Medicine

## 2012-11-22 ENCOUNTER — Encounter: Payer: Self-pay | Admitting: *Deleted

## 2012-11-22 ENCOUNTER — Telehealth: Payer: Self-pay

## 2012-11-22 VITALS — BP 114/60 | HR 60 | Temp 97.6°F | Wt 129.0 lb

## 2012-11-22 DIAGNOSIS — R410 Disorientation, unspecified: Secondary | ICD-10-CM

## 2012-11-22 DIAGNOSIS — F29 Unspecified psychosis not due to a substance or known physiological condition: Secondary | ICD-10-CM

## 2012-11-22 DIAGNOSIS — R42 Dizziness and giddiness: Secondary | ICD-10-CM

## 2012-11-22 DIAGNOSIS — G459 Transient cerebral ischemic attack, unspecified: Secondary | ICD-10-CM

## 2012-11-22 DIAGNOSIS — E039 Hypothyroidism, unspecified: Secondary | ICD-10-CM

## 2012-11-22 LAB — CBC WITH DIFFERENTIAL/PLATELET
Eosinophils Relative: 3 % (ref 0.0–5.0)
HCT: 37.6 % (ref 36.0–46.0)
Lymphs Abs: 1.6 10*3/uL (ref 0.7–4.0)
Monocytes Relative: 9.2 % (ref 3.0–12.0)
Neutrophils Relative %: 64.6 % (ref 43.0–77.0)
Platelets: 332 10*3/uL (ref 150.0–400.0)
RBC: 4.19 Mil/uL (ref 3.87–5.11)
WBC: 7.3 10*3/uL (ref 4.5–10.5)

## 2012-11-22 LAB — COMPREHENSIVE METABOLIC PANEL
Albumin: 4.2 g/dL (ref 3.5–5.2)
Alkaline Phosphatase: 78 U/L (ref 39–117)
BUN: 19 mg/dL (ref 6–23)
GFR: 58.03 mL/min — ABNORMAL LOW (ref >60.00)
Glucose, Bld: 100 mg/dL — ABNORMAL HIGH (ref 70–99)
Potassium: 3.6 mEq/L (ref 3.5–5.1)

## 2012-11-22 LAB — T4, FREE: Free T4: 0.94 ng/dL (ref 0.60–1.60)

## 2012-11-22 MED ORDER — ALPRAZOLAM 0.5 MG PO TABS: ORAL_TABLET | ORAL | Status: DC

## 2012-11-22 MED ORDER — GADOBENATE DIMEGLUMINE 529 MG/ML IV SOLN
10.0000 mL | Freq: Once | INTRAVENOUS | Status: AC | PRN
  Administered 2012-11-22: 10 mL via INTRAVENOUS

## 2012-11-22 NOTE — Patient Instructions (Addendum)
Good to see you. Please stop by to see Shirlee Limerick on your way out (after you go to the lab) to set up your MRI. If your symptoms worsen, please go straight to the emergency room.

## 2012-11-22 NOTE — Telephone Encounter (Signed)
Pt said last week had problems driving(staying in between yellow lines of lane in the road), could not get her thoughts into words and dizziness. Today pt said she feels better and has appt to see Dr Dayton Martes today at 11:30 am. Pt got Bonine and wants to know if she should take this morning. Pt said she is not dizzy now and will not be driving to dr appt. Advised pt Bonine is as needed so if she is not dizzy she does not have to take med. Advised pt if condition changes or worsens before appt to call office.

## 2012-11-22 NOTE — Telephone Encounter (Signed)
Pt was seen by Dr. Dayton Martes today, sent for brain MRI today.  Received called report from [facility]- no acute infarct. Full report to follow.

## 2012-11-22 NOTE — Progress Notes (Signed)
67 yo female here for feeling off for 6 days.  Initially couldn't not back out of her driveway in a straight line on Tuesday- this happened at least twice. Then her husband noticed the next day, she was swerving in and out of lane when she was driving.  Started to feel a little dizzy but does not feel like the room is spinning.  At one point, she complained of difficulty getting her words out and felt confused. That has resolved.  She is under significant stress currently- husband's health is not good.  No facial droop, no other deficits that she noticed.  No h/o CVA.  She is not on any anticoagulation medication.  H/o of hypothyroidism- no changes in medication recently.  Lab Results  Component Value Date   TSH 2.35 04/01/2012    Lab Results  Component Value Date   CHOL 176 04/01/2012   HDL 56.60 04/01/2012   LDLCALC 95 04/01/2012   LDLDIRECT 129.2 05/28/2007   TRIG 122.0 04/01/2012   CHOLHDL 3 04/01/2012     Past Medical History  Diagnosis Date  . Allergy   . Depression   . Hypertension   . Osteoporosis   . Hypothyroidism   . Asthma   . GERD (gastroesophageal reflux disease)    Past Surgical History  Procedure Date  . Partial hysterectomy 1989  . Colonoscopy    History  Substance Use Topics  . Smoking status: Never Smoker   . Smokeless tobacco: Not on file  . Alcohol Use: 1.0 oz/week    2 drink(s) per week   Family History  Problem Relation Age of Onset  . Cancer Mother   . Drug abuse Son   . Depression Son   . Stroke Paternal Aunt   . Heart disease Paternal Grandfather   . Diabetes Maternal Grandfather    No Known Allergies Current Outpatient Prescriptions on File Prior to Visit  Medication Sig Dispense Refill  . albuterol (PROAIR HFA) 108 (90 BASE) MCG/ACT inhaler Inhale 2 puffs into the lungs. Every 4-6 hours       . Alum & Mag Hydroxide-Simeth (MAGIC MOUTHWASH W/LIDOCAINE) SOLN Swish and spit out 10 ml by mouth up to 4 times daily  240 mL  0  .  atorvastatin (LIPITOR) 20 MG tablet Take 1 tablet (20 mg total) by mouth daily.  90 tablet  1  . bumetanide (BUMEX) 1 MG tablet Take 1 tablet (1 mg total) by mouth 2 (two) times daily.  180 tablet  2  . buPROPion (WELLBUTRIN) 75 MG tablet Take 1 tablet (75 mg total) by mouth 2 (two) times daily.  180 tablet  2  . Calcium Carbonate-Vitamin D (CALCIUM 600/VITAMIN D) 600-400 MG-UNIT per tablet Take 1 tablet by mouth 2 (two) times daily.        . calcium-vitamin D 185 MG TABS Take 185 mg by mouth daily.        . cholecalciferol (VITAMIN D) 1000 UNITS tablet Take 1 tablet (1,000 Units total) by mouth daily.  30 tablet  11  . cyclobenzaprine (FLEXERIL) 10 MG tablet Take 10 mg by mouth 2 (two) times daily as needed. As needed for back pain       . diclofenac sodium (VOLTAREN) 1 % GEL Apply as needed  100 Tube  3  . esomeprazole (NEXIUM) 40 MG capsule Take 1 capsule (40 mg total) by mouth daily.  90 capsule  1  . fexofenadine (ALLEGRA) 60 MG tablet Take 60 mg by mouth 2 (  two) times daily.        . fluticasone (FLONASE) 50 MCG/ACT nasal spray Place 2 sprays into the nose daily.  48 g  3  . Fluticasone-Salmeterol (ADVAIR DISKUS) 100-50 MCG/DOSE AEPB Inhale 1 puff into the lungs 2 (two) times daily.  3 each  3  . HYDROcodone-acetaminophen (VICODIN) 5-500 MG per tablet Take 1 tablet by mouth every 6 (six) hours as needed.        Marland Kitchen levothyroxine (SYNTHROID, LEVOTHROID) 50 MCG tablet Take 1 tablet (50 mcg total) by mouth daily.  90 tablet  1  . nystatin (MYCOSTATIN) 100000 UNIT/ML suspension SWISH BY MOUTH 10 ML UP TO 4 TIMES A DAY  240 mL  0  . potassium chloride (MICRO-K) 10 MEQ CR capsule Take 1 capsule (10 mEq total) by mouth daily.  90 capsule  1  . quinapril (ACCUPRIL) 10 MG tablet Take 1 tablet (10 mg total) by mouth daily.  90 tablet  1  . traZODone (DESYREL) 150 MG tablet Take one tablet by mouth at bedtime as needed for insomnia  30 tablet  1  . vitamin E 1000 UNIT capsule Take 1,000 Units by mouth  daily.           Patient Active Problem List  Diagnosis  . HYPOTHYROIDISM  . DEPRESSION  . HYPERTENSION  . GERD  . PAIN IN JOINT, ANKLE/FOOT  . OSTEOPENIA  . Routine general medical examination at a health care facility  . Insomnia   Past Medical History  Diagnosis Date  . Allergy   . Depression   . Hypertension   . Osteoporosis   . Hypothyroidism   . Asthma   . GERD (gastroesophageal reflux disease)    Past Surgical History  Procedure Date  . Partial hysterectomy 1989  . Colonoscopy    History  Substance Use Topics  . Smoking status: Never Smoker   . Smokeless tobacco: Not on file  . Alcohol Use: 1.0 oz/week    2 drink(s) per week   Family History  Problem Relation Age of Onset  . Cancer Mother   . Drug abuse Son   . Depression Son   . Stroke Paternal Aunt   . Heart disease Paternal Grandfather   . Diabetes Maternal Grandfather    No Known Allergies Current Outpatient Prescriptions on File Prior to Visit  Medication Sig Dispense Refill  . albuterol (PROAIR HFA) 108 (90 BASE) MCG/ACT inhaler Inhale 2 puffs into the lungs. Every 4-6 hours       . Alum & Mag Hydroxide-Simeth (MAGIC MOUTHWASH W/LIDOCAINE) SOLN Swish and spit out 10 ml by mouth up to 4 times daily  240 mL  0  . atorvastatin (LIPITOR) 20 MG tablet Take 1 tablet (20 mg total) by mouth daily.  90 tablet  1  . bumetanide (BUMEX) 1 MG tablet Take 1 tablet (1 mg total) by mouth 2 (two) times daily.  180 tablet  2  . buPROPion (WELLBUTRIN) 75 MG tablet Take 1 tablet (75 mg total) by mouth 2 (two) times daily.  180 tablet  2  . Calcium Carbonate-Vitamin D (CALCIUM 600/VITAMIN D) 600-400 MG-UNIT per tablet Take 1 tablet by mouth 2 (two) times daily.        . calcium-vitamin D 185 MG TABS Take 185 mg by mouth daily.        . cholecalciferol (VITAMIN D) 1000 UNITS tablet Take 1 tablet (1,000 Units total) by mouth daily.  30 tablet  11  . cyclobenzaprine (FLEXERIL)  10 MG tablet Take 10 mg by mouth 2 (two)  times daily as needed. As needed for back pain       . diclofenac sodium (VOLTAREN) 1 % GEL Apply as needed  100 Tube  3  . esomeprazole (NEXIUM) 40 MG capsule Take 1 capsule (40 mg total) by mouth daily.  90 capsule  1  . fexofenadine (ALLEGRA) 60 MG tablet Take 60 mg by mouth 2 (two) times daily.        . fluticasone (FLONASE) 50 MCG/ACT nasal spray Place 2 sprays into the nose daily.  48 g  3  . Fluticasone-Salmeterol (ADVAIR DISKUS) 100-50 MCG/DOSE AEPB Inhale 1 puff into the lungs 2 (two) times daily.  3 each  3  . HYDROcodone-acetaminophen (VICODIN) 5-500 MG per tablet Take 1 tablet by mouth every 6 (six) hours as needed.        Marland Kitchen levothyroxine (SYNTHROID, LEVOTHROID) 50 MCG tablet Take 1 tablet (50 mcg total) by mouth daily.  90 tablet  1  . nystatin (MYCOSTATIN) 100000 UNIT/ML suspension SWISH BY MOUTH 10 ML UP TO 4 TIMES A DAY  240 mL  0  . potassium chloride (MICRO-K) 10 MEQ CR capsule Take 1 capsule (10 mEq total) by mouth daily.  90 capsule  1  . quinapril (ACCUPRIL) 10 MG tablet Take 1 tablet (10 mg total) by mouth daily.  90 tablet  1  . traZODone (DESYREL) 150 MG tablet Take one tablet by mouth at bedtime as needed for insomnia  30 tablet  1  . vitamin E 1000 UNIT capsule Take 1,000 Units by mouth daily.          The PMH, PSH, Social History, Family History, Medications, and allergies have been reviewed in [facility], and have been updated if relevant.  ROS: See HPI   Physical exam: Wt 129 lb (58.514 kg) BP 114/60  Pulse 60  Temp 97.6 F (36.4 C)  Wt 129 lb (58.514 kg)   General:  Well-developed,well-nourished,in no acute distress; alert,appropriate and cooperative throughout examination Head:  normocephalic and atraumatic.   Eyes:  vision grossly intact, pupils equal, pupils round, and pupils reactive to light.   No nyastagmus but did get a little dizzy when asked to lay down and turn head to left. Ears:  R ear normal and L ear normal.   Nose:  no external deformity.     Mouth:  good dentition.   Neck:  No deformities, masses, or tenderness noted. Lungs:  Normal respiratory effort, chest expands symmetrically. Lungs are clear to auscultation, no crackles or wheezes. Heart:  Normal rate and regular rhythm. S1 and S2 normal without gallop, murmur, click, rub or other extra sounds. Abdomen:  Bowel sounds positive,abdomen soft and non-tender without masses, organomegaly or hernias noted. Msk:  No deformity or scoliosis noted of thoracic or lumbar spine.   Extremities:  No clubbing, cyanosis, edema, or deformity noted with normal full range of motion of all joints.   Neurologic:  alert & oriented X3 and gait normal.   CN II- XII intact, neg romberg Reflexes normal and symmetrical, grip strength equal Skin:  Intact without suspicious lesions or rashes Psych:  Cognition and judgment appear intact. Alert and cooperative with normal attention span and concentration. No apparent delusions, illusions, hallucination  Assessment and Plan:  1. Confusion  New resolving.   Concerning for TIA vs CVA.  Will order stat MRI of brain to rule out stroke, malignancy or other abnormalities. Check labs as well. MR  Brain W Wo Contrast, Comprehensive metabolic panel, Vitamin B12, TSH, T4, Free, CBC with Differential  2. Dizziness  New- persistent.   Does have some findings suggestive of mild vertigo however other symptoms would not correlate with this. See above. MR Brain W Wo Contrast, Comprehensive metabolic panel, Vitamin B12, TSH, T4, Free, CBC with Differential

## 2012-11-23 ENCOUNTER — Other Ambulatory Visit: Payer: Self-pay | Admitting: Family Medicine

## 2012-11-23 DIAGNOSIS — G459 Transient cerebral ischemic attack, unspecified: Secondary | ICD-10-CM

## 2012-11-26 ENCOUNTER — Ambulatory Visit (INDEPENDENT_AMBULATORY_CARE_PROVIDER_SITE_OTHER): Payer: Medicare Other | Admitting: Family Medicine

## 2012-11-26 ENCOUNTER — Telehealth: Payer: Self-pay

## 2012-11-26 ENCOUNTER — Encounter: Payer: Self-pay | Admitting: Family Medicine

## 2012-11-26 VITALS — BP 132/76 | HR 67 | Temp 97.6°F | Wt 129.0 lb

## 2012-11-26 DIAGNOSIS — N390 Urinary tract infection, site not specified: Secondary | ICD-10-CM

## 2012-11-26 DIAGNOSIS — R300 Dysuria: Secondary | ICD-10-CM

## 2012-11-26 LAB — POCT URINALYSIS DIPSTICK
Ketones, UA: POSITIVE
Protein, UA: NEGATIVE
Spec Grav, UA: 1.02
pH, UA: 6

## 2012-11-26 MED ORDER — SULFAMETHOXAZOLE-TRIMETHOPRIM 800-160 MG PO TABS: 1.0000 | ORAL_TABLET | Freq: Two times a day (BID) | ORAL | Status: DC

## 2012-11-26 NOTE — Patient Instructions (Addendum)
Drink plenty of water and start the antibiotics today.  We'll contact you with your lab report.  Take care.   

## 2012-11-26 NOTE — Telephone Encounter (Signed)
Will see later today

## 2012-11-26 NOTE — Assessment & Plan Note (Signed)
Presumed, check ucx and start septra. Nontoxic.  F/u prn.

## 2012-11-26 NOTE — Telephone Encounter (Signed)
Pt has painful urination and voiding small amounts; just started this morning.Pt thinks UTI. Pt to see Dr Para March today at 3:15pm. If condition changes or worsens prior to appt pt will call back.

## 2012-11-26 NOTE — Progress Notes (Signed)
No more episodes of confusion in the meantime.  No more troubles similar to prev.    Dysuria: yes, urgency, voiding, dec in amount per void duration of symptoms: last few days abdominal pain:no fevers:no back pain:no vomiting:no  Meds, vitals, and allergies reviewed.   ROS: See HPI.  Otherwise negative.    GEN: nad, alert and oriented HEENT: mucous membranes moist NECK: supple CV: rrr.  PULM: ctab, no inc wob ABD: soft, +bs, suprapubic area not tender EXT: no edema SKIN: no acute rash BACK: no CVA pain

## 2012-11-29 ENCOUNTER — Telehealth: Payer: Self-pay

## 2012-11-29 ENCOUNTER — Encounter (INDEPENDENT_AMBULATORY_CARE_PROVIDER_SITE_OTHER): Payer: Medicare Other

## 2012-11-29 DIAGNOSIS — G459 Transient cerebral ischemic attack, unspecified: Secondary | ICD-10-CM

## 2012-11-29 DIAGNOSIS — R269 Unspecified abnormalities of gait and mobility: Secondary | ICD-10-CM

## 2012-11-29 DIAGNOSIS — I6529 Occlusion and stenosis of unspecified carotid artery: Secondary | ICD-10-CM

## 2012-11-29 NOTE — Telephone Encounter (Signed)
I would treat as constipation in meantime (okay to take miralax once a day for now).  I would finish the abx in the meantime.  Thanks.

## 2012-11-29 NOTE — Telephone Encounter (Signed)
Patient advised.

## 2012-11-29 NOTE — Telephone Encounter (Signed)
Pt seen 11/26/12; over weekend pt began with abdominal pain just below waist line on lt side. Constipation on and off for 2 weeks. No fever,nausea or vomiting or diarrhea. Pt had carotid artery test today; technician said minor amount of plaque noted.pt still waiting on urine c/s.Please advise.

## 2012-11-30 ENCOUNTER — Ambulatory Visit (HOSPITAL_COMMUNITY): Payer: Medicare Other | Attending: Internal Medicine | Admitting: Radiology

## 2012-11-30 DIAGNOSIS — I080 Rheumatic disorders of both mitral and aortic valves: Secondary | ICD-10-CM | POA: Insufficient documentation

## 2012-11-30 DIAGNOSIS — R42 Dizziness and giddiness: Secondary | ICD-10-CM | POA: Insufficient documentation

## 2012-11-30 DIAGNOSIS — G459 Transient cerebral ischemic attack, unspecified: Secondary | ICD-10-CM

## 2012-11-30 DIAGNOSIS — I10 Essential (primary) hypertension: Secondary | ICD-10-CM | POA: Insufficient documentation

## 2012-11-30 DIAGNOSIS — E039 Hypothyroidism, unspecified: Secondary | ICD-10-CM | POA: Insufficient documentation

## 2012-11-30 NOTE — Progress Notes (Signed)
Echocardiogram performed.  

## 2012-12-01 ENCOUNTER — Other Ambulatory Visit: Payer: Self-pay | Admitting: Family Medicine

## 2012-12-01 ENCOUNTER — Telehealth: Payer: Self-pay

## 2012-12-01 DIAGNOSIS — G459 Transient cerebral ischemic attack, unspecified: Secondary | ICD-10-CM

## 2012-12-01 NOTE — Telephone Encounter (Signed)
Pt left v/m tranzodone 150 mg and  2 of melatonin taking at bedtime is not helping pt to sleep; pt request different med [facility] [address].Please advise.

## 2012-12-02 NOTE — Telephone Encounter (Signed)
Advised patient as instructed, she will increase melatonin to 3 a night.

## 2012-12-02 NOTE — Telephone Encounter (Signed)
Unfortunately, as we discussed I would like to avoid medications like Ambien and Lunesta.  They are strong but come with side effects and are considered unsafe to use in patients above the age of 67.  I would try increasing dose of Melatonin and giving the trazodone a little more time.

## 2012-12-13 ENCOUNTER — Telehealth: Payer: Self-pay | Admitting: Cardiology

## 2012-12-13 NOTE — Telephone Encounter (Signed)
Left message for pt to call.

## 2012-12-13 NOTE — Telephone Encounter (Signed)
New Problem    Pt called in saying she had recently had a TIA in January and just now had one again (she stated it wasn't as bad as the one in Jan). I called triage (spoke to [place]) and she advised she go to the ER to get checked out or go see a neurologist. Pt states she doesn't feel like waiting around in the ER and would rather wait until tomorrow.

## 2012-12-13 NOTE — Telephone Encounter (Signed)
Spoke with pt, she reports this am she had an episode of left shoulder tightness and trouble forming her words. This is the same type symptoms she had prior to her TIA in [place]. She has a new pt appt tomorrow with dr Jens Som due to her echo and question of PFO. She is symptom free at present.

## 2012-12-13 NOTE — Telephone Encounter (Signed)
Discussed pt with dr hochrein, pt to be seen tomorrow. Pt voiced understanding if TIA symptoms return she will need to go to the ER for eval.

## 2012-12-14 ENCOUNTER — Other Ambulatory Visit: Payer: Self-pay | Admitting: Cardiology

## 2012-12-14 ENCOUNTER — Encounter: Payer: Self-pay | Admitting: Cardiology

## 2012-12-14 ENCOUNTER — Ambulatory Visit (INDEPENDENT_AMBULATORY_CARE_PROVIDER_SITE_OTHER): Payer: Medicare Other | Admitting: Cardiology

## 2012-12-14 ENCOUNTER — Encounter: Payer: Self-pay | Admitting: *Deleted

## 2012-12-14 VITALS — BP 115/70 | HR 66 | Wt 127.0 lb

## 2012-12-14 DIAGNOSIS — G459 Transient cerebral ischemic attack, unspecified: Secondary | ICD-10-CM

## 2012-12-14 DIAGNOSIS — I10 Essential (primary) hypertension: Secondary | ICD-10-CM

## 2012-12-14 MED ORDER — CLOPIDOGREL BISULFATE 75 MG PO TABS
75.0000 mg | ORAL_TABLET | Freq: Every day | ORAL | Status: DC
Start: 2012-12-14 — End: 2012-12-20

## 2012-12-14 MED ORDER — PANTOPRAZOLE SODIUM 40 MG PO TBEC: 40.0000 mg | DELAYED_RELEASE_TABLET | Freq: Every day | ORAL | Status: DC

## 2012-12-14 NOTE — Progress Notes (Signed)
HPI: 67 year old female for evaluation of TIA. Echocardiogram in January of 2014 showed normal LV function, mild left ventricular hypertrophy, mild aortic insufficiency. A patent foramen ovale could not be excluded. Carotid Dopplers in January of 2014 revealed 0-39% bilateral stenosis and followup recommended in 2 years. Brain MRI in January 2014 showed no acute infarct, mild small vessel disease. Laboratory showed normal B12, TSH and free T4. Hemoglobin normal. Patient states that 10 years ago while working at a computer, she had transient dysarthria for several minutes. She had no further symptoms until January of 2014. While driving she developed difficulty speaking and erratic driving. She pulled to the side of the road in her symptoms resolved after 15 minutes. She has had several episodes since similar to above. No dyspnea, chest pain or palpitations. No loss of strength or sensation in her extremities. No numbness, incontinence or seizure activity. Workup by primary care is as above. We were asked to evaluate for possible PFO.  Current Outpatient Prescriptions  Medication Sig Dispense Refill  . albuterol (PROAIR HFA) 108 (90 BASE) MCG/ACT inhaler Inhale 2 puffs into the lungs. Every 4-6 hours       . ALPRAZolam (XANAX) 0.5 MG tablet 1 tablet 30 minutes prior to procedure  5 tablet  0  . Alum & Mag Hydroxide-Simeth (MAGIC MOUTHWASH W/LIDOCAINE) SOLN Swish and spit out 10 ml by mouth up to 4 times daily  240 mL  0  . aspirin 81 MG tablet Take 81 mg by mouth daily.      Marland Kitchen atorvastatin (LIPITOR) 20 MG tablet Take 1 tablet (20 mg total) by mouth daily.  90 tablet  1  . bumetanide (BUMEX) 1 MG tablet Take 1 tablet (1 mg total) by mouth 2 (two) times daily.  180 tablet  2  . buPROPion (WELLBUTRIN) 75 MG tablet Take 1 tablet (75 mg total) by mouth 2 (two) times daily.  180 tablet  2  . Calcium Carbonate-Vitamin D (CALCIUM 600/VITAMIN D) 600-400 MG-UNIT per tablet Take 1 tablet by mouth 2 (two) times  daily.        . calcium-vitamin D 185 MG TABS Take 185 mg by mouth daily.        . cholecalciferol (VITAMIN D) 1000 UNITS tablet Take 1 tablet (1,000 Units total) by mouth daily.  30 tablet  11  . cyclobenzaprine (FLEXERIL) 10 MG tablet Take 10 mg by mouth 2 (two) times daily as needed. As needed for back pain       . diclofenac sodium (VOLTAREN) 1 % GEL Apply as needed  100 Tube  3  . esomeprazole (NEXIUM) 40 MG capsule Take 1 capsule (40 mg total) by mouth daily.  90 capsule  1  . fexofenadine (ALLEGRA) 60 MG tablet Take 60 mg by mouth 2 (two) times daily.        . fluticasone (FLONASE) 50 MCG/ACT nasal spray Place 2 sprays into the nose daily.  48 g  3  . Fluticasone-Salmeterol (ADVAIR DISKUS) 100-50 MCG/DOSE AEPB Inhale 1 puff into the lungs 2 (two) times daily.  3 each  3  . HYDROcodone-acetaminophen (VICODIN) 5-500 MG per tablet Take 1 tablet by mouth every 6 (six) hours as needed.        Marland Kitchen levothyroxine (SYNTHROID, LEVOTHROID) 50 MCG tablet Take 1 tablet (50 mcg total) by mouth daily.  90 tablet  1  . nystatin (MYCOSTATIN) 100000 UNIT/ML suspension SWISH BY MOUTH 10 ML UP TO 4 TIMES A DAY  240 mL  0  .  potassium chloride (MICRO-K) 10 MEQ CR capsule Take 1 capsule (10 mEq total) by mouth daily.  90 capsule  1  . quinapril (ACCUPRIL) 10 MG tablet Take 1 tablet (10 mg total) by mouth daily.  90 tablet  1  . traZODone (DESYREL) 150 MG tablet Take one tablet by mouth at bedtime as needed for insomnia  30 tablet  1  . vitamin E 1000 UNIT capsule Take 1,000 Units by mouth daily.         No current facility-administered medications for this visit.    No Known Allergies  Past Medical History  Diagnosis Date  . Allergy   . Depression   . Hypertension   . Osteoporosis   . Hypothyroidism   . Asthma   . GERD (gastroesophageal reflux disease)   . Hyperlipidemia     Past Surgical History  Procedure Laterality Date  . Partial hysterectomy  1989  . Colonoscopy      History   Social  History  . Marital Status: Married    Spouse Name: N/A    Number of Children: 2  . Years of Education: N/A   Occupational History  .     Social History Main Topics  . Smoking status: Never Smoker   . Smokeless tobacco: Not on file  . Alcohol Use: 1.0 oz/week    2 drink(s) per week     Comment: Rare  . Drug Use: No  . Sexually Active: Not on file   Other Topics Concern  . Not on file   Social History Narrative  . No narrative on file    Family History  Problem Relation Age of Onset  . Cancer Mother   . Drug abuse Son   . Depression Son   . Stroke Paternal Aunt   . Heart disease Paternal Grandfather   . Diabetes Maternal Grandfather     ROS: no fevers or chills, productive cough, hemoptysis, dysphasia, odynophagia, melena, hematochezia, dysuria, hematuria, rash, seizure activity, orthopnea, PND, pedal edema, claudication. Remaining systems are negative.  Physical Exam:   Blood pressure 115/70, pulse 66, weight 127 lb (57.607 kg).  General:  Well developed/well nourished in NAD Skin warm/dry Patient not depressed No peripheral clubbing Back-normal HEENT-normal/normal eyelids Neck supple/normal carotid upstroke bilaterally; no bruits; no JVD; no thyromegaly chest - CTA/ normal expansion CV - RRR/normal S1 and S2; no murmurs, rubs or gallops;  PMI nondisplaced Abdomen -NT/ND, no HSM, no mass, + bowel sounds, no bruit 2+ femoral pulses, no bruits Ext-no edema, chords, 2+ DP Neuro-grossly nonfocal  ECG sinus rhythm at a rate of 66. No significant ST changes.

## 2012-12-14 NOTE — Assessment & Plan Note (Signed)
Possible TIA. Schedule transesophageal echocardiogram. Schedule atrial fibrillation monitor. Add Plavix 75 mg daily. Refer to neurology. Change nexium to protonix.

## 2012-12-14 NOTE — Assessment & Plan Note (Signed)
Blood pressure controlled. Continue present medications. 

## 2012-12-14 NOTE — Patient Instructions (Addendum)
Your physician recommends that you schedule a follow-up appointment in: 6 WEEKS WITH DR CRENSHAW  START PLAVIX 75 MG ONCE DAILY  Your physician has requested that you have a TEE. During a TEE, sound waves are used to create images of your heart. It provides your doctor with information about the size and shape of your heart and how well your heart's chambers and valves are working. In this test, a transducer is attached to the end of a flexible tube that's guided down your throat and into your esophagus (the tube leading from you mouth to your stomach) to get a more detailed image of your heart. You are not awake for the procedure. Please see the instruction sheet given to you today. For further information please visit [email or web address]   Your physician has recommended that you wear an event monitor. Event monitors are medical devices that record the heart's electrical activity. Doctors most often [place] these monitors to diagnose arrhythmias. Arrhythmias are problems with the speed or rhythm of the heartbeat. The monitor is a small, portable device. You can wear one while you do your normal daily activities. This is usually used to diagnose what is causing palpitations/syncope (passing out). FOR ATRIAL FIB   REFERRAL TO NEUROLOGY FOR TIA  STOP NEXIUM  START PROTONIX 40 MG ONCE DAILY

## 2012-12-20 ENCOUNTER — Encounter (INDEPENDENT_AMBULATORY_CARE_PROVIDER_SITE_OTHER): Payer: Medicare Other

## 2012-12-20 ENCOUNTER — Telehealth: Payer: Self-pay | Admitting: Cardiology

## 2012-12-20 ENCOUNTER — Telehealth: Payer: Self-pay | Admitting: *Deleted

## 2012-12-20 DIAGNOSIS — R42 Dizziness and giddiness: Secondary | ICD-10-CM

## 2012-12-20 DIAGNOSIS — G459 Transient cerebral ischemic attack, unspecified: Secondary | ICD-10-CM

## 2012-12-20 MED ORDER — RIVAROXABAN 20 MG PO TABS: 20.0000 mg | ORAL_TABLET | Freq: Every day | ORAL | Status: DC

## 2012-12-20 NOTE — Telephone Encounter (Signed)
30 day event monitor placed on Pt 12/20/12  TK

## 2012-12-20 NOTE — Telephone Encounter (Signed)
Pull strips of afib, cbc and bmet 4 weeks after starting xeralto Patricia Peck

## 2012-12-20 NOTE — Telephone Encounter (Signed)
Per Dr. [place]:  A-fib confirmed on monitor.  D/C Plavix and ASA.  Start pt on Xarelto 20mg  daily.  Pt is advised to f/u with Dr. Jens Som after TEE.  Pt aware of plan.

## 2012-12-20 NOTE — Telephone Encounter (Signed)
New Problem   Calling in to report Series EKG recording: Rapid atrial fib

## 2012-12-20 NOTE — Telephone Encounter (Signed)
Spoke with [employer] from [employer]. Pt was placed on a Holter monitor today. At 1:35 PM pt was in   A-fib rate 170 beats/minute. According to [facility] the  rate is lower at this time. E-cardio report will be fax to this office soon today after collecting all the information needed.

## 2012-12-22 ENCOUNTER — Encounter (HOSPITAL_COMMUNITY): Payer: Self-pay | Admitting: *Deleted

## 2012-12-22 ENCOUNTER — Encounter (HOSPITAL_COMMUNITY)
Admission: RE | Disposition: A | Discharge status: Home / Self Care | Payer: Self-pay | Source: Ambulatory Visit | Attending: Cardiology

## 2012-12-22 ENCOUNTER — Telehealth: Payer: Self-pay | Admitting: Cardiology

## 2012-12-22 ENCOUNTER — Ambulatory Visit (HOSPITAL_COMMUNITY)
Admission: RE | Admit: 2012-12-22 | Discharge: 2012-12-22 | Disposition: A | Discharge status: Home / Self Care | Payer: Medicare Other | Source: Ambulatory Visit | Attending: Cardiology | Admitting: Cardiology

## 2012-12-22 DIAGNOSIS — G459 Transient cerebral ischemic attack, unspecified: Secondary | ICD-10-CM | POA: Insufficient documentation

## 2012-12-22 DIAGNOSIS — I059 Rheumatic mitral valve disease, unspecified: Secondary | ICD-10-CM | POA: Insufficient documentation

## 2012-12-22 DIAGNOSIS — I359 Nonrheumatic aortic valve disorder, unspecified: Secondary | ICD-10-CM | POA: Insufficient documentation

## 2012-12-22 HISTORY — DX: Unspecified osteoarthritis, unspecified site: M19.90

## 2012-12-22 HISTORY — DX: Transient cerebral ischemic attack, unspecified: G45.9

## 2012-12-22 HISTORY — PX: TEE WITHOUT CARDIOVERSION: SHX5443

## 2012-12-22 SURGERY — ECHOCARDIOGRAM, TRANSESOPHAGEAL
Anesthesia: Moderate Sedation

## 2012-12-22 MED ORDER — FENTANYL CITRATE 0.05 MG/ML IJ SOLN
INTRAMUSCULAR | Status: AC
  Filled 2012-12-22: qty 2

## 2012-12-22 MED ORDER — SODIUM CHLORIDE 0.9 % IV SOLN
INTRAVENOUS | Status: DC
  Administered 2012-12-22: 11:00:00 via INTRAVENOUS

## 2012-12-22 MED ORDER — BUTAMBEN-TETRACAINE-BENZOCAINE 2-2-14 % EX AERO
INHALATION_SPRAY | CUTANEOUS | Status: DC | PRN
  Administered 2012-12-22: 2 via TOPICAL

## 2012-12-22 MED ORDER — MIDAZOLAM HCL 5 MG/ML IJ SOLN
INTRAMUSCULAR | Status: AC
  Filled 2012-12-22: qty 2

## 2012-12-22 MED ORDER — MIDAZOLAM HCL 10 MG/2ML IJ SOLN
INTRAMUSCULAR | Status: DC | PRN
  Administered 2012-12-22 (×2): 2 mg via INTRAVENOUS

## 2012-12-22 MED ORDER — FENTANYL CITRATE 0.05 MG/ML IJ SOLN
INTRAMUSCULAR | Status: DC | PRN
  Administered 2012-12-22 (×2): 25 ug via INTRAVENOUS

## 2012-12-22 NOTE — Telephone Encounter (Signed)
Spoke with pt, questions regarding atrial fib, neuro referral and meds answered.

## 2012-12-22 NOTE — CV Procedure (Signed)
See final TEE report in [employer]; normal LV function; negative saline microcavitation study. Patricia Peck

## 2012-12-22 NOTE — H&P (Signed)
Patricia Peck  12/14/2012 2:30 PM   Office Visit  MRN:  [id number]   Description: 67 year old female  Provider: [name], MD  Department: [facility]        Referring Provider    [name], MD      Diagnoses    TIA (transient ischemic attack)    -  Primary    435.9    Unspecified essential hypertension        401.9      Reason for Visit    Transient Ischemic Attack    New patient evaluation       Reason For Visit History Recorded         Progress Notes    [name], MD at 12/14/2012  3:36 PM    Status: Signed                    HPI: 67 year old female for evaluation of TIA. Echocardiogram in January of 2014 showed normal LV function, mild left ventricular hypertrophy, mild aortic insufficiency. A patent foramen ovale could not be excluded. Carotid Dopplers in January of 2014 revealed 0-39% bilateral stenosis and followup recommended in 2 years. Brain MRI in January 2014 showed no acute infarct, mild small vessel disease. Laboratory showed normal B12, TSH and free T4. Hemoglobin normal. Patient states that 10 years ago while working at a computer, she had transient dysarthria for several minutes. She had no further symptoms until January of 2014. While driving she developed difficulty speaking and erratic driving. She pulled to the side of the road in her symptoms resolved after 15 minutes. She has had several episodes since similar to above. No dyspnea, chest pain or palpitations. No loss of strength or sensation in her extremities. No numbness, incontinence or seizure activity. Workup by primary care is as above. We were asked to evaluate for possible PFO.    Current Outpatient Prescriptions   Medication  Sig  Dispense  Refill   .  albuterol (PROAIR HFA) 108 (90 BASE) MCG/ACT inhaler  Inhale 2 puffs into the lungs. Every 4-6 hours          .  ALPRAZolam (XANAX) 0.5 MG tablet  1 tablet 30 minutes prior to procedure   5 tablet   0   .  Alum &  Mag Hydroxide-Simeth (MAGIC MOUTHWASH W/LIDOCAINE) SOLN  Swish and spit out 10 ml by mouth up to 4 times daily   240 mL   0   .  aspirin 81 MG tablet  Take 81 mg by mouth daily.         Marland Kitchen  atorvastatin (LIPITOR) 20 MG tablet  Take 1 tablet (20 mg total) by mouth daily.   90 tablet   1   .  bumetanide (BUMEX) 1 MG tablet  Take 1 tablet (1 mg total) by mouth 2 (two) times daily.   180 tablet   2   .  buPROPion (WELLBUTRIN) 75 MG tablet  Take 1 tablet (75 mg total) by mouth 2 (two) times daily.   180 tablet   2   .  Calcium Carbonate-Vitamin D (CALCIUM 600/VITAMIN D) 600-400 MG-UNIT per tablet  Take 1 tablet by mouth 2 (two) times daily.           .  calcium-vitamin D 185 MG TABS  Take 185 mg by mouth daily.           .  cholecalciferol (VITAMIN D)  1000 UNITS tablet  Take 1 tablet (1,000 Units total) by mouth daily.   30 tablet   11   .  cyclobenzaprine (FLEXERIL) 10 MG tablet  Take 10 mg by mouth 2 (two) times daily as needed. As needed for back pain          .  diclofenac sodium (VOLTAREN) 1 % GEL  Apply as needed   100 Tube   3   .  esomeprazole (NEXIUM) 40 MG capsule  Take 1 capsule (40 mg total) by mouth daily.   90 capsule   1   .  fexofenadine (ALLEGRA) 60 MG tablet  Take 60 mg by mouth 2 (two) times daily.           .  fluticasone (FLONASE) 50 MCG/ACT nasal spray  Place 2 sprays into the nose daily.   48 g   3   .  Fluticasone-Salmeterol (ADVAIR DISKUS) 100-50 MCG/DOSE AEPB  Inhale 1 puff into the lungs 2 (two) times daily.   3 each   3   .  HYDROcodone-acetaminophen (VICODIN) 5-500 MG per tablet  Take 1 tablet by mouth every 6 (six) hours as needed.           Marland Kitchen  levothyroxine (SYNTHROID, LEVOTHROID) 50 MCG tablet  Take 1 tablet (50 mcg total) by mouth daily.   90 tablet   1   .  nystatin (MYCOSTATIN) 100000 UNIT/ML suspension  SWISH BY MOUTH 10 ML UP TO 4 TIMES A DAY   240 mL   0   .  potassium chloride (MICRO-K) 10 MEQ CR capsule  Take 1 capsule (10 mEq total) by mouth daily.   90 capsule   1    .  quinapril (ACCUPRIL) 10 MG tablet  Take 1 tablet (10 mg total) by mouth daily.   90 tablet   1   .  traZODone (DESYREL) 150 MG tablet  Take one tablet by mouth at bedtime as needed for insomnia   30 tablet   1   .  vitamin E 1000 UNIT capsule  Take 1,000 Units by mouth daily.               No current facility-administered medications for this visit.        No Known Allergies  Past Medical History   Diagnosis  Date   .  Allergy     .  Depression     .  Hypertension     .  Osteoporosis     .  Hypothyroidism     .  Asthma     .  GERD (gastroesophageal reflux disease)     .  Hyperlipidemia           Past Surgical History   Procedure  Laterality  Date   .  Partial hysterectomy    1989   .  Colonoscopy             History       Social History   .  Marital Status:  Married       Spouse Name:  N/A       Number of Children:  2   .  Years of Education:  N/A       Occupational History   .           Social History Main Topics   .  Smoking status:  Never Smoker    .  Smokeless tobacco:  Not on file   .  Alcohol Use:  1.0 oz/week       2 drink(s) per week         Comment: Rare   .  Drug Use:  No   .  Sexually Active:  Not on file       Other Topics  Concern   .  Not on file       Social History Narrative   .  No narrative on file         Family History   Problem  Relation  Age of Onset   .  Cancer  Mother     .  Drug abuse  Son     .  Depression  Son     .  Stroke  Paternal Aunt     .  Heart disease  Paternal Grandfather     .  Diabetes  Maternal Grandfather          ROS: no fevers or chills, productive cough, hemoptysis, dysphasia, odynophagia, melena, hematochezia, dysuria, hematuria, rash, seizure activity, orthopnea, PND, pedal edema, claudication. Remaining systems are negative.   Physical Exam:    Blood pressure 115/70, pulse 66, weight 127 lb (57.607 kg).   General:  Well developed/well nourished in NAD Skin warm/dry Patient  not depressed No peripheral clubbing Back-normal HEENT-normal/normal eyelids Neck supple/normal carotid upstroke bilaterally; no bruits; no JVD; no thyromegaly chest - CTA/ normal expansion CV - RRR/normal S1 and S2; no murmurs, rubs or gallops;  PMI nondisplaced Abdomen -NT/ND, no HSM, no mass, + bowel sounds, no bruit 2+ femoral pulses, no bruits Ext-no edema, chords, 2+ DP Neuro-grossly nonfocal   ECG sinus rhythm at a rate of 66. No significant ST changes.              HYPERTENSION - [name], MD at 12/14/2012  3:08 PM    Status: Written Related Problem: HYPERTENSION           Blood pressure controlled. Continue present medications.         TIA (transient ischemic attack) - [name], MD at 12/14/2012  3:09 PM    Status: Written Related Problem: TIA (transient ischemic attack)           Possible TIA. Schedule transesophageal echocardiogram. Schedule atrial fibrillation monitor. Add Plavix 75 mg daily. Refer to neurology. Change nexium to protonix.       For TEE; no changes in above. Olga Millers

## 2012-12-22 NOTE — Telephone Encounter (Signed)
New Problem:    Patient called in wanting to speak with you about a referral to neurology and about being taking off of her nexium and still taking Prevacid.   Please call back.

## 2012-12-22 NOTE — Progress Notes (Signed)
Echocardiogram Echocardiogram Transesophageal has been performed.  Patricia Peck 12/22/2012, 11:51 AM

## 2012-12-22 NOTE — Interval H&P Note (Signed)
History and Physical Interval Note:  12/22/2012 11:08 AM  Patricia Peck  has presented today for surgery, with the diagnosis of tia  The various methods of treatment have been discussed with the patient and family. After consideration of risks, benefits and other options for treatment, the patient has consented to  Procedure(s): TRANSESOPHAGEAL ECHOCARDIOGRAM (TEE) (N/A) as a surgical intervention .  The patient's history has been reviewed, patient examined, no change in status, stable for surgery.  I have reviewed the patient's chart and labs.  Questions were answered to the patient's satisfaction.     Olga Millers

## 2012-12-22 NOTE — Progress Notes (Signed)
Patient oxygen saturation dropped after sedation, upon stimulation patient's saturations increased. No reversal needed.

## 2012-12-23 ENCOUNTER — Encounter (HOSPITAL_COMMUNITY): Payer: Self-pay | Admitting: Cardiology

## 2012-12-23 ENCOUNTER — Telehealth: Payer: Self-pay

## 2012-12-23 MED ORDER — TRAZODONE HCL 50 MG PO TABS: ORAL_TABLET | ORAL | Status: DC

## 2012-12-23 NOTE — Telephone Encounter (Signed)
Ok to change and send rx as requested.

## 2012-12-23 NOTE — Telephone Encounter (Signed)
Advised patient.  She wants you to know that they are finally working for her.  Also, she is going to neurologist.  Had test done yesterday and they didn't find any holes in her heart.

## 2012-12-23 NOTE — Telephone Encounter (Signed)
Thanks for update

## 2012-12-23 NOTE — Telephone Encounter (Signed)
Pt left v/m trazodone 150 mg is a large tab that pt has to cut into pieces and the pieces have sharp edges and difficult for pt to swallow. Pt requesting rx for trazodone 50 mg taking 3 pills at bedtime as needed be [facility] to [facility] [address]. Pt request call back.Please advise.

## 2012-12-27 ENCOUNTER — Telehealth: Payer: Self-pay | Admitting: Cardiology

## 2012-12-27 NOTE — Telephone Encounter (Signed)
New problem   [employer] wont give pt but 31 day of Xarelto and pt only has 22 pills left. Ins company [employer] need auth form from Dr Jens Som. Pt wants 90 day supply to [facility] [place].

## 2012-12-27 NOTE — Telephone Encounter (Signed)
Will forward to [facility]

## 2012-12-28 ENCOUNTER — Other Ambulatory Visit: Payer: Self-pay | Admitting: Neurology

## 2012-12-28 DIAGNOSIS — I4891 Unspecified atrial fibrillation: Secondary | ICD-10-CM

## 2012-12-28 DIAGNOSIS — G459 Transient cerebral ischemic attack, unspecified: Secondary | ICD-10-CM

## 2012-12-28 MED ORDER — RIVAROXABAN 20 MG PO TABS: 20.0000 mg | ORAL_TABLET | Freq: Every day | ORAL | Status: DC

## 2012-12-28 NOTE — Telephone Encounter (Signed)
Called and received prior auth for xarelto. Pt aware resent to pharm.

## 2012-12-30 ENCOUNTER — Ambulatory Visit
Admission: RE | Admit: 2012-12-30 | Discharge: 2012-12-30 | Disposition: A | Discharge status: Home / Self Care | Payer: Medicare Other | Source: Ambulatory Visit | Attending: Neurology | Admitting: Neurology

## 2012-12-30 DIAGNOSIS — G459 Transient cerebral ischemic attack, unspecified: Secondary | ICD-10-CM

## 2012-12-30 DIAGNOSIS — I4891 Unspecified atrial fibrillation: Secondary | ICD-10-CM

## 2013-01-04 ENCOUNTER — Other Ambulatory Visit: Payer: Self-pay | Admitting: *Deleted

## 2013-01-04 MED ORDER — POTASSIUM CHLORIDE ER 10 MEQ PO CPCR: 10.0000 meq | ORAL_CAPSULE | Freq: Every day | ORAL | Status: DC

## 2013-01-04 MED ORDER — DICLOFENAC SODIUM 1 % TD GEL: TRANSDERMAL | Status: DC

## 2013-01-04 MED ORDER — FLUTICASONE-SALMETEROL 100-50 MCG/DOSE IN AEPB: 1.0000 | INHALATION_SPRAY | Freq: Two times a day (BID) | RESPIRATORY_TRACT | Status: DC

## 2013-01-04 MED ORDER — BUMETANIDE 1 MG PO TABS: 1.0000 mg | ORAL_TABLET | Freq: Two times a day (BID) | ORAL | Status: DC

## 2013-01-04 MED ORDER — QUINAPRIL HCL 10 MG PO TABS: 10.0000 mg | ORAL_TABLET | Freq: Every day | ORAL | Status: DC

## 2013-01-04 MED ORDER — ATORVASTATIN CALCIUM 20 MG PO TABS: 20.0000 mg | ORAL_TABLET | Freq: Every day | ORAL | Status: DC

## 2013-01-04 MED ORDER — LEVOTHYROXINE SODIUM 50 MCG PO TABS: 50.0000 ug | ORAL_TABLET | Freq: Every day | ORAL | Status: DC

## 2013-01-04 MED ORDER — FLUTICASONE PROPIONATE 50 MCG/ACT NA SUSP: 2.0000 | Freq: Every day | NASAL | Status: DC

## 2013-01-04 MED ORDER — BUPROPION HCL 75 MG PO TABS: 75.0000 mg | ORAL_TABLET | Freq: Two times a day (BID) | ORAL | Status: DC

## 2013-01-04 MED ORDER — TRAZODONE HCL 50 MG PO TABS: ORAL_TABLET | ORAL | Status: DC

## 2013-01-04 NOTE — Telephone Encounter (Signed)
Pt is requesting 90 day supplies of voltaren gel and trazodone be sent to [facility] [address].

## 2013-01-05 ENCOUNTER — Telehealth: Payer: Self-pay | Admitting: Cardiology

## 2013-01-05 NOTE — Telephone Encounter (Signed)
New Problem:    Patient called in wanting to state that she resume taking 90 day prescription of Nexium in place of her pantoprazole (PROTONIX) 40 MG tablet because it had worked well.  Please call back.

## 2013-01-06 NOTE — Telephone Encounter (Signed)
Patient has changed back to the Nexium 40 mg daily like she was taking previously, Protonix not helping as much. Did have some on hand resumed but is requesting refill. Will forward to Dr Jens Som and Victorio Palm. RN.

## 2013-01-06 NOTE — Telephone Encounter (Signed)
Follow Up  Returning phone call from this morning.

## 2013-01-06 NOTE — Telephone Encounter (Signed)
Patient should fu with her primary care for this medicine. Patricia Peck

## 2013-01-06 NOTE — Telephone Encounter (Signed)
Left message for pt to call.

## 2013-01-10 ENCOUNTER — Other Ambulatory Visit: Payer: Self-pay

## 2013-01-10 MED ORDER — ESOMEPRAZOLE MAGNESIUM 40 MG PO CPDR: 40.0000 mg | DELAYED_RELEASE_CAPSULE | Freq: Every day | ORAL | Status: DC

## 2013-01-10 NOTE — Telephone Encounter (Signed)
Pt said Dr Jens Som, cardiologist had stopped Nexium and started pt on pantoprazole. Pt said pantoprazole did not work as well and asked Dr Jens Som for 3 month refill Nexium to [facility] [address]. Pt was advised to contact PCP. Pt said Nexium works well. Pt has not seen GI dr yet as discussed at 09/07/12.Please advise.

## 2013-01-27 ENCOUNTER — Other Ambulatory Visit: Payer: Self-pay | Admitting: *Deleted

## 2013-01-27 ENCOUNTER — Telehealth: Payer: Self-pay | Admitting: Cardiology

## 2013-01-27 ENCOUNTER — Ambulatory Visit (INDEPENDENT_AMBULATORY_CARE_PROVIDER_SITE_OTHER): Payer: Medicare Other | Admitting: Cardiology

## 2013-01-27 ENCOUNTER — Encounter: Payer: Self-pay | Admitting: Cardiology

## 2013-01-27 VITALS — BP 124/72 | HR 64 | Ht 63.5 in | Wt 120.1 lb

## 2013-01-27 DIAGNOSIS — I679 Cerebrovascular disease, unspecified: Secondary | ICD-10-CM

## 2013-01-27 DIAGNOSIS — E876 Hypokalemia: Secondary | ICD-10-CM

## 2013-01-27 DIAGNOSIS — I480 Paroxysmal atrial fibrillation: Secondary | ICD-10-CM | POA: Insufficient documentation

## 2013-01-27 DIAGNOSIS — I4891 Unspecified atrial fibrillation: Secondary | ICD-10-CM

## 2013-01-27 LAB — BASIC METABOLIC PANEL
CO2: 27 mEq/L (ref 19–32)
Calcium: 9.2 mg/dL (ref 8.4–10.5)
Chloride: 98 mEq/L (ref 96–112)
Creatinine, Ser: 0.9 mg/dL (ref 0.4–1.2)
Glucose, Bld: 87 mg/dL (ref 70–99)
Sodium: 134 mEq/L — ABNORMAL LOW (ref 135–145)

## 2013-01-27 LAB — CBC WITH DIFFERENTIAL/PLATELET
Basophils Absolute: 0.1 10*3/uL (ref 0.0–0.1)
Eosinophils Absolute: 0.2 10*3/uL (ref 0.0–0.7)
Hemoglobin: 12.4 g/dL (ref 12.0–15.0)
Lymphocytes Relative: 26.5 % (ref 12.0–46.0)
MCHC: 33.4 g/dL (ref 30.0–36.0)
MCV: 89.3 fl (ref 78.0–100.0)
Monocytes Absolute: 0.8 10*3/uL (ref 0.1–1.0)
Neutro Abs: 4.4 10*3/uL (ref 1.4–7.7)
RDW: 12.6 % (ref 11.5–14.6)

## 2013-01-27 MED ORDER — POTASSIUM CHLORIDE ER 10 MEQ PO CPCR: 10.0000 meq | ORAL_CAPSULE | Freq: Two times a day (BID) | ORAL | Status: DC

## 2013-01-27 MED ORDER — METOPROLOL SUCCINATE ER 25 MG PO TB24: 25.0000 mg | ORAL_TABLET | Freq: Every day | ORAL | Status: DC

## 2013-01-27 NOTE — Assessment & Plan Note (Signed)
Mild. Repeat carotid Dopplers January 2016.

## 2013-01-27 NOTE — Telephone Encounter (Signed)
New problem   Pt stated she just spoke to and she have another question. She want to know what is she picking up.

## 2013-01-27 NOTE — Assessment & Plan Note (Signed)
Patient remains in sinus rhythm. Monitor showed paroxysmal atrial fibrillation/flutter. She has had recent TIA. Plan discontinue lisinopril. Add Toprol 25 mg daily. She will need lifelong anticoagulation and we will continue xeralto. Check CBC and renal function.

## 2013-01-27 NOTE — Assessment & Plan Note (Signed)
Given atrial fibrillation will discontinue lisinopril and treat with Toprol 25 mg daily.

## 2013-01-27 NOTE — Patient Instructions (Addendum)
Your physician wants you to follow-up in: 6 MONTHS WITH DR Jens Som You will receive a reminder letter in the mail two months in advance. If you don't receive a letter, please call our office to schedule the follow-up appointment.   STOP QUINAPRIL  START METOPROLOL SUCC 25 MG ONCE DAILY  Your physician recommends that you HAVE LAB WORK TODAY

## 2013-01-27 NOTE — Progress Notes (Signed)
Quick Note:  Change KCL to 20 meQ daily bmet one week Olga Millers  ______

## 2013-01-27 NOTE — Progress Notes (Signed)
HPI: Pleasant female for fu of atrial fibrillation and TIA. Echocardiogram in January of 2014 showed normal LV function, mild left ventricular hypertrophy, mild aortic insufficiency. A patent foramen ovale could not be excluded. Carotid Dopplers in January of 2014 revealed 0-39% bilateral stenosis and followup recommended in 2 years. Brain MRI in January 2014 showed no acute infarct, mild small vessel disease. Laboratory showed normal B12, TSH and free T4. Hemoglobin normal. When I saw her previously we scheduled a transesophageal echocardiogram which was performed in February of 2014. Her LV function was normal. There was mild mitral valve prolapse with mild mitral regurgitation. There was trace aortic insufficiency. There was no PFO. Monitor in March of 2014 showed paroxysmal atrial fibrillation/flutter. She was therefore started on anticoagulation. Since I last saw her mild dyspnea on exertion but no orthopnea, PND, pedal edema, chest pain, syncope, bleeding or palpitations.   Current Outpatient Prescriptions  Medication Sig Dispense Refill  . Alum & Mag Hydroxide-Simeth (MAGIC MOUTHWASH W/LIDOCAINE) SOLN Swish and spit out 10 ml by mouth up to 4 times daily  240 mL  0  . atorvastatin (LIPITOR) 20 MG tablet Take 1 tablet (20 mg total) by mouth daily.  90 tablet  1  . bumetanide (BUMEX) 1 MG tablet Take 1 tablet (1 mg total) by mouth 2 (two) times daily.  180 tablet  1  . buPROPion (WELLBUTRIN) 75 MG tablet Take 1 tablet (75 mg total) by mouth 2 (two) times daily.  180 tablet  0  . Calcium Carbonate-Vitamin D (CALCIUM 600/VITAMIN D) 600-400 MG-UNIT per tablet Take 1 tablet by mouth 2 (two) times daily.        . calcium-vitamin D 185 MG TABS Take 185 mg by mouth daily.        . cholecalciferol (VITAMIN D) 1000 UNITS tablet Take 1 tablet (1,000 Units total) by mouth daily.  30 tablet  11  . cyclobenzaprine (FLEXERIL) 10 MG tablet Take 10 mg by mouth 2 (two) times daily as needed. As needed for back  pain       . diclofenac sodium (VOLTAREN) 1 % GEL Apply as needed  1 Tube  0  . esomeprazole (NEXIUM) 40 MG capsule Take 1 capsule (40 mg total) by mouth daily.  90 capsule  0  . fluticasone (FLONASE) 50 MCG/ACT nasal spray Place 2 sprays into the nose daily.  48 g  1  . Fluticasone-Salmeterol (ADVAIR DISKUS) 100-50 MCG/DOSE AEPB Inhale 1 puff into the lungs 2 (two) times daily.  180 each  1  . HYDROcodone-acetaminophen (VICODIN) 5-500 MG per tablet Take 1 tablet by mouth every 6 (six) hours as needed.        Marland Kitchen levothyroxine (SYNTHROID, LEVOTHROID) 50 MCG tablet Take 1 tablet (50 mcg total) by mouth daily.  90 tablet  1  . nystatin (MYCOSTATIN) 100000 UNIT/ML suspension SWISH BY MOUTH 10 ML UP TO 4 TIMES A DAY  240 mL  0  . potassium chloride (MICRO-K) 10 MEQ CR capsule Take 1 capsule (10 mEq total) by mouth daily.  90 capsule  1  . quinapril (ACCUPRIL) 10 MG tablet Take 1 tablet (10 mg total) by mouth daily.  90 tablet  1  . Rivaroxaban (XARELTO) 20 MG TABS Take 1 tablet (20 mg total) by mouth daily.  90 tablet  3  . traZODone (DESYREL) 50 MG tablet Take 3 tablets by mouth at bedtime.  270 tablet  0  . vitamin E 1000 UNIT capsule Take 1,000 Units by  mouth daily.        . fexofenadine (ALLEGRA) 60 MG tablet Take 60 mg by mouth 2 (two) times daily.         No current facility-administered medications for this visit.     Past Medical History  Diagnosis Date  . Allergy   . Depression   . Hypertension   . Osteoporosis   . Hypothyroidism   . Asthma   . GERD (gastroesophageal reflux disease)   . Hyperlipidemia   . TIA (transient ischemic attack)   . Arthritis     hands  . Atrial fibrillation   . Atrial flutter     Past Surgical History  Procedure Laterality Date  . Partial hysterectomy  1989  . Colonoscopy    . Tee without cardioversion N/A 12/22/2012    Procedure: TRANSESOPHAGEAL ECHOCARDIOGRAM (TEE);  Surgeon: [name], MD;  Location[facility] ENDOSCOPY;  Service:  Cardiovascular;  Laterality: N/A;    History   Social History  . Marital Status: Married    Spouse Name: N/A    Number of Children: 2  . Years of Education: N/A   Occupational History  .     Social History Main Topics  . Smoking status: Never Smoker   . Smokeless tobacco: Never Used  . Alcohol Use: 1.0 oz/week    2 drink(s) per week     Comment: Rare  . Drug Use: No  . Sexually Active: Not on file   Other Topics Concern  . Not on file   Social History Narrative  . No narrative on file    ROS: no fevers or chills, productive cough, hemoptysis, dysphasia, odynophagia, melena, hematochezia, dysuria, hematuria, rash, seizure activity, orthopnea, PND, pedal edema, claudication. Remaining systems are negative.  Physical Exam: Well-developed well-nourished in no acute distress.  Skin is warm and dry.  HEENT is normal.  Neck is supple.  Chest is clear to auscultation with normal expansion.  Cardiovascular exam is regular rate and rhythm.  Abdominal exam nontender or distended. No masses palpated. Extremities show no edema. neuro grossly intact  ECG sinus rhythm at a rate of 64. Occasional PACs. No significant ST changes.

## 2013-01-27 NOTE — Telephone Encounter (Signed)
Spoke with pt, questions regarding potassium answered. 

## 2013-02-03 ENCOUNTER — Other Ambulatory Visit (INDEPENDENT_AMBULATORY_CARE_PROVIDER_SITE_OTHER): Payer: Medicare Other

## 2013-02-03 DIAGNOSIS — E876 Hypokalemia: Secondary | ICD-10-CM

## 2013-02-03 LAB — BASIC METABOLIC PANEL
BUN: 15 mg/dL (ref 6–23)
Calcium: 9.2 mg/dL (ref 8.4–10.5)
GFR: 65.41 mL/min (ref >60.00)
Glucose, Bld: 82 mg/dL (ref 70–99)

## 2013-02-04 ENCOUNTER — Telehealth: Payer: Self-pay | Admitting: Cardiology

## 2013-02-04 NOTE — Telephone Encounter (Signed)
Spoke with pt, aware of lab results. 

## 2013-02-04 NOTE — Telephone Encounter (Signed)
New Problem:    Patient called in returning your call regarding her recent lab results.  Please call back.

## 2013-03-07 ENCOUNTER — Ambulatory Visit (INDEPENDENT_AMBULATORY_CARE_PROVIDER_SITE_OTHER): Payer: Medicare Other | Admitting: Family Medicine

## 2013-03-07 ENCOUNTER — Encounter: Payer: Self-pay | Admitting: Family Medicine

## 2013-03-07 VITALS — BP 124/70 | HR 60 | Temp 97.4°F | Wt 115.0 lb

## 2013-03-07 DIAGNOSIS — B370 Candidal stomatitis: Secondary | ICD-10-CM | POA: Insufficient documentation

## 2013-03-07 DIAGNOSIS — R198 Other specified symptoms and signs involving the digestive system and abdomen: Secondary | ICD-10-CM

## 2013-03-07 DIAGNOSIS — B3781 Candidal esophagitis: Secondary | ICD-10-CM

## 2013-03-07 NOTE — Progress Notes (Signed)
Subjective:    Patient ID: Patricia Peck, female    DOB: 1946/05/04, 67 y.o.   MRN: [id number]  HPI  Very pleasant 67 yo female here for follow up gastroenteritis.  She and her husband were on a cruise during the end of last month. On 4/24, she started vomiting (along with her husband).  A few other passengers with sick.  The next day, vomiting resolved but perfuse, watery diarrhea with abdominal cramping began. She brings in records from MD she saw in [place]. Abd ultrasound neg. Placed on cipro and flagyl- last doses last night.  No recurrent vomiting. No blood in stool.  Stool is still lose.  Abdominal pain has resolved. She has been drinking as much fluid as possible.  ?due for colonoscopy  Patient Active Problem List   Diagnosis Date Noted  . Thrush of mouth and esophagus 03/07/2013  . GI symptoms 03/07/2013  . Atrial fibrillation 01/27/2013  . Cerebrovascular disease 01/27/2013  . TIA (transient ischemic attack) 12/14/2012  . Confusion 11/22/2012  . Dizziness 11/22/2012  . Insomnia 11/02/2012  . Routine general medical examination at a health care facility 03/05/2011  . GERD 03/12/2010  . PAIN IN JOINT, ANKLE/FOOT 08/03/2007  . HYPOTHYROIDISM 04/14/2007  . DEPRESSION 04/14/2007  . HYPERTENSION 04/14/2007  . OSTEOPENIA 04/14/2007   Past Medical History  Diagnosis Date  . Allergy   . Depression   . Hypertension   . Osteoporosis   . Hypothyroidism   . Asthma   . GERD (gastroesophageal reflux disease)   . Hyperlipidemia   . TIA (transient ischemic attack)   . Arthritis     hands  . Atrial fibrillation   . Atrial flutter    Past Surgical History  Procedure Laterality Date  . Partial hysterectomy  1989  . Colonoscopy    . Tee without cardioversion N/A 12/22/2012    Procedure: TRANSESOPHAGEAL ECHOCARDIOGRAM (TEE);  Surgeon: [name], MD;  Location: [facility] ENDOSCOPY;  Service: Cardiovascular;  Laterality: N/A;   History  Substance Use Topics  . Smoking  status: Never Smoker   . Smokeless tobacco: Never Used  . Alcohol Use: 1.0 oz/week    2 drink(s) per week     Comment: Rare   Family History  Problem Relation Age of Onset  . Cancer Mother   . Drug abuse Son   . Depression Son   . Stroke Paternal Aunt   . Heart disease Paternal Grandfather   . Diabetes Maternal Grandfather    No Known Allergies Current Outpatient Prescriptions on File Prior to Visit  Medication Sig Dispense Refill  . Alum & Mag Hydroxide-Simeth (MAGIC MOUTHWASH W/LIDOCAINE) SOLN Swish and spit out 10 ml by mouth up to 4 times daily  240 mL  0  . atorvastatin (LIPITOR) 20 MG tablet Take 1 tablet (20 mg total) by mouth daily.  90 tablet  1  . bumetanide (BUMEX) 1 MG tablet Take 1 tablet (1 mg total) by mouth 2 (two) times daily.  180 tablet  1  . buPROPion (WELLBUTRIN) 75 MG tablet Take 1 tablet (75 mg total) by mouth 2 (two) times daily.  180 tablet  0  . Calcium Carbonate-Vitamin D (CALCIUM 600/VITAMIN D) 600-400 MG-UNIT per tablet Take 1 tablet by mouth 2 (two) times daily.        . calcium-vitamin D 185 MG TABS Take 185 mg by mouth daily.        . cholecalciferol (VITAMIN D) 1000 UNITS tablet Take 1 tablet (1,000 Units  total) by mouth daily.  30 tablet  11  . cyclobenzaprine (FLEXERIL) 10 MG tablet Take 10 mg by mouth 2 (two) times daily as needed. As needed for back pain       . diclofenac sodium (VOLTAREN) 1 % GEL Apply as needed  1 Tube  0  . esomeprazole (NEXIUM) 40 MG capsule Take 1 capsule (40 mg total) by mouth daily.  90 capsule  0  . fexofenadine (ALLEGRA) 60 MG tablet Take 60 mg by mouth 2 (two) times daily.        . fluticasone (FLONASE) 50 MCG/ACT nasal spray Place 2 sprays into the nose daily.  48 g  1  . Fluticasone-Salmeterol (ADVAIR DISKUS) 100-50 MCG/DOSE AEPB Inhale 1 puff into the lungs 2 (two) times daily.  180 each  1  . HYDROcodone-acetaminophen (VICODIN) 5-500 MG per tablet Take 1 tablet by mouth every 6 (six) hours as needed.        Marland Kitchen  levothyroxine (SYNTHROID, LEVOTHROID) 50 MCG tablet Take 1 tablet (50 mcg total) by mouth daily.  90 tablet  1  . metoprolol succinate (TOPROL XL) 25 MG 24 hr tablet Take 1 tablet (25 mg total) by mouth daily.  90 tablet  3  . nystatin (MYCOSTATIN) 100000 UNIT/ML suspension SWISH BY MOUTH 10 ML UP TO 4 TIMES A DAY  240 mL  0  . potassium chloride (MICRO-K) 10 MEQ CR capsule Take 1 capsule (10 mEq total) by mouth 2 (two) times daily.  180 capsule  3  . Rivaroxaban (XARELTO) 20 MG TABS Take 1 tablet (20 mg total) by mouth daily.  90 tablet  3  . traZODone (DESYREL) 50 MG tablet Take 3 tablets by mouth at bedtime.  270 tablet  0  . vitamin E 1000 UNIT capsule Take 1,000 Units by mouth daily.         No current facility-administered medications on file prior to visit.   The PMH, PSH, Social History, Family History, Medications, and allergies have been reviewed in [facility], and have been updated if relevant.    Review of Systems See HPI +tongue soreness    Objective:   Physical Exam BP 124/70  Pulse 60  Temp(Src) 97.4 F (36.3 C)  Wt 115 lb (52.164 kg)  BMI 20.05 kg/m2  General:  Well-developed,well-nourished,in no acute distress; alert,appropriate and cooperative throughout examination Head:  normocephalic and atraumatic.    Mouth:  good dentition.    Lungs:  Normal respiratory effort, chest expands symmetrically. Lungs are clear to auscultation, no crackles or wheezes. Heart:  Normal rate and regular rhythm. S1 and S2 normal without gallop, murmur, click, rub or other extra sounds. Abdomen:  Bowel sounds positive,abdomen soft and non-tender without masses, organomegaly or hernias noted. Msk:  No deformity or scoliosis noted of thoracic or lumbar spine.   Extremities:  No clubbing, cyanosis, edema, or deformity noted with normal full range of motion of all joints.   Neurologic:  alert & oriented X3 and gait normal.   Skin:  Intact without suspicious lesions or rashes Psych:  Cognition  and judgment appear intact. Alert and cooperative with normal attention span and concentration. No apparent delusions, illusions, hallucinations      Assessment & Plan:  1. Thrush of mouth and esophagus Secondary to abx. She has nystatin rinse at home- I advised she start using. 2. GI symptoms Resolving gastroenteritis- exam reassuring. Advised probiotics. Will continue [facility] to see if due for colonoscopy per pt request.

## 2013-03-07 NOTE — Patient Instructions (Addendum)
Good to see you. Please continue the magic mouthwash.  You can buy Align probiotic over the counter.  Please stop by the pepto and immodium.

## 2013-03-08 ENCOUNTER — Telehealth: Payer: Self-pay

## 2013-03-08 ENCOUNTER — Encounter: Payer: Self-pay | Admitting: Family Medicine

## 2013-03-08 NOTE — Telephone Encounter (Signed)
Pt seen on 03/07/13 with diarrhea; pt supposed to go to [place] on 03/11/13; pt said Dr Dayton Martes did not recommend pt to travel until diarrhea from recent cruise resolved. Pt will bring form to be completed by Dr Dayton Martes when pt receives insurance form from travel co.

## 2013-03-09 ENCOUNTER — Other Ambulatory Visit: Payer: Self-pay | Admitting: Family Medicine

## 2013-03-11 ENCOUNTER — Telehealth: Payer: Self-pay | Admitting: Family Medicine

## 2013-03-11 NOTE — Telephone Encounter (Signed)
Advised patient form is ready for pick up at front desk, copy sent for scanning.

## 2013-03-11 NOTE — Telephone Encounter (Signed)
Form is on your desk.  Also, pt requests changing her potassium capsules to tablets, so that she can dissolve them, changed on med list and at pharmacy.

## 2013-03-11 NOTE — Telephone Encounter (Signed)
Form completed and on my desk. 

## 2013-03-11 NOTE — Telephone Encounter (Signed)
Mrs. Bachand dropped off a cancelation form to be filled out by Dr. Dayton Martes. Mrs. Moragne said to call her when this form is ready.

## 2013-03-31 ENCOUNTER — Other Ambulatory Visit: Payer: Self-pay | Admitting: Family Medicine

## 2013-03-31 DIAGNOSIS — Z136 Encounter for screening for cardiovascular disorders: Secondary | ICD-10-CM

## 2013-03-31 DIAGNOSIS — I10 Essential (primary) hypertension: Secondary | ICD-10-CM

## 2013-03-31 DIAGNOSIS — Z0000 Encounter for general adult medical examination without abnormal findings: Secondary | ICD-10-CM

## 2013-03-31 DIAGNOSIS — E039 Hypothyroidism, unspecified: Secondary | ICD-10-CM

## 2013-04-04 ENCOUNTER — Other Ambulatory Visit (INDEPENDENT_AMBULATORY_CARE_PROVIDER_SITE_OTHER): Payer: Medicare Other

## 2013-04-04 DIAGNOSIS — Z136 Encounter for screening for cardiovascular disorders: Secondary | ICD-10-CM

## 2013-04-04 DIAGNOSIS — I4891 Unspecified atrial fibrillation: Secondary | ICD-10-CM

## 2013-04-04 DIAGNOSIS — Z0000 Encounter for general adult medical examination without abnormal findings: Secondary | ICD-10-CM

## 2013-04-04 DIAGNOSIS — I10 Essential (primary) hypertension: Secondary | ICD-10-CM

## 2013-04-04 DIAGNOSIS — E039 Hypothyroidism, unspecified: Secondary | ICD-10-CM

## 2013-04-04 LAB — LIPID PANEL
Cholesterol: 166 mg/dL (ref 0–200)
HDL: 51.4 mg/dL (ref >39.00)
LDL Cholesterol: 95 mg/dL (ref 0–99)
VLDL: 19.6 mg/dL (ref 0.0–40.0)

## 2013-04-04 LAB — T4, FREE: Free T4: 0.84 ng/dL (ref 0.60–1.60)

## 2013-04-04 LAB — CBC WITH DIFFERENTIAL/PLATELET
Basophils Absolute: 0.1 10*3/uL (ref 0.0–0.1)
Eosinophils Absolute: 0.2 10*3/uL (ref 0.0–0.7)
HCT: 36.9 % (ref 36.0–46.0)
Hemoglobin: 12.5 g/dL (ref 12.0–15.0)
Lymphs Abs: 1.9 10*3/uL (ref 0.7–4.0)
MCHC: 33.8 g/dL (ref 30.0–36.0)
MCV: 88.2 fl (ref 78.0–100.0)
Monocytes Absolute: 0.8 10*3/uL (ref 0.1–1.0)
Neutro Abs: 5.3 10*3/uL (ref 1.4–7.7)
Platelets: 278 10*3/uL (ref 150.0–400.0)
RDW: 13 % (ref 11.5–14.6)

## 2013-04-04 LAB — COMPREHENSIVE METABOLIC PANEL
ALT: 20 U/L (ref 0–35)
AST: 17 U/L (ref 0–37)
Creatinine, Ser: 0.9 mg/dL (ref 0.4–1.2)
GFR: 70.73 mL/min (ref >60.00)
Total Bilirubin: 0.6 mg/dL (ref 0.3–1.2)

## 2013-04-05 ENCOUNTER — Other Ambulatory Visit: Payer: Medicare Other

## 2013-04-11 ENCOUNTER — Encounter: Payer: Self-pay | Admitting: Family Medicine

## 2013-04-11 ENCOUNTER — Ambulatory Visit (INDEPENDENT_AMBULATORY_CARE_PROVIDER_SITE_OTHER): Payer: Medicare Other | Admitting: Family Medicine

## 2013-04-11 VITALS — BP 120/80 | HR 80 | Temp 97.5°F | Ht 63.5 in | Wt 118.0 lb

## 2013-04-11 DIAGNOSIS — Z1211 Encounter for screening for malignant neoplasm of colon: Secondary | ICD-10-CM

## 2013-04-11 DIAGNOSIS — Z1331 Encounter for screening for depression: Secondary | ICD-10-CM

## 2013-04-11 DIAGNOSIS — E039 Hypothyroidism, unspecified: Secondary | ICD-10-CM

## 2013-04-11 DIAGNOSIS — I10 Essential (primary) hypertension: Secondary | ICD-10-CM

## 2013-04-11 DIAGNOSIS — F329 Major depressive disorder, single episode, unspecified: Secondary | ICD-10-CM

## 2013-04-11 DIAGNOSIS — K589 Irritable bowel syndrome without diarrhea: Secondary | ICD-10-CM

## 2013-04-11 DIAGNOSIS — I4891 Unspecified atrial fibrillation: Secondary | ICD-10-CM

## 2013-04-11 DIAGNOSIS — Z0000 Encounter for general adult medical examination without abnormal findings: Secondary | ICD-10-CM

## 2013-04-11 DIAGNOSIS — F3289 Other specified depressive episodes: Secondary | ICD-10-CM

## 2013-04-11 MED ORDER — FEXOFENADINE HCL 60 MG PO TABS: 60.0000 mg | ORAL_TABLET | Freq: Two times a day (BID) | ORAL | Status: DC

## 2013-04-11 MED ORDER — LINACLOTIDE 145 MCG PO CAPS: ORAL_CAPSULE | ORAL | Status: DC

## 2013-04-11 NOTE — Progress Notes (Signed)
67 yo pleasant female here for annual medicare wellness visit.  I have personally reviewed the Medicare Annual Wellness questionnaire and have noted 1. The patient's medical and social history 2. Their use of alcohol, tobacco or illicit drugs 3. Their current medications and supplements 4. The patient's functional ability including ADL's, fall risks, home safety risks and hearing or visual             impairment. 5. Diet and physical activities 6. Evidence for depression or mood disorders  End of life wishes discussed and updated in Social History.  Mammogram last month.  Very physically active- plays tennis daily.  Hypothyroidism- denies any symptoms of hypo or hyperthyroidism. Lab Results  Component Value Date   TSH 2.22 04/04/2013    Lab Results  Component Value Date   CHOL 166 04/04/2013   HDL 51.40 04/04/2013   LDLCALC 95 04/04/2013   LDLDIRECT 129.2 05/28/2007   TRIG 98.0 04/04/2013   CHOLHDL 3 04/04/2013   Constipation and feeling bloated remains an issue.  Has been for years.  Align helped initially, not helping much any more.  No blood in stool.     UTD on all other prevention.  Patient Active Problem List   Diagnosis Date Noted  . Routine general medical examination at a health care facility 03/31/2013  . GI symptoms 03/07/2013  . Atrial fibrillation 01/27/2013  . Cerebrovascular disease 01/27/2013  . TIA (transient ischemic attack) 12/14/2012  . Confusion 11/22/2012  . Dizziness 11/22/2012  . Insomnia 11/02/2012  . GERD 03/12/2010  . HYPOTHYROIDISM 04/14/2007  . DEPRESSION 04/14/2007  . HYPERTENSION 04/14/2007  . OSTEOPENIA 04/14/2007   Past Medical History  Diagnosis Date  . Allergy   . Depression   . Hypertension   . Osteoporosis   . Hypothyroidism   . Asthma   . GERD (gastroesophageal reflux disease)   . Hyperlipidemia   . TIA (transient ischemic attack)   . Arthritis     hands  . Atrial fibrillation   . Atrial flutter    Past Surgical History   Procedure Laterality Date  . Partial hysterectomy  1989  . Colonoscopy    . Tee without cardioversion N/A 12/22/2012    Procedure: TRANSESOPHAGEAL ECHOCARDIOGRAM (TEE);  Surgeon: [name], MD;  Location: [facility] ENDOSCOPY;  Service: Cardiovascular;  Laterality: N/A;   History  Substance Use Topics  . Smoking status: Never Smoker   . Smokeless tobacco: Never Used  . Alcohol Use: 1.0 oz/week    2 drink(s) per week     Comment: Rare   Family History  Problem Relation Age of Onset  . Cancer Mother   . Drug abuse Son   . Depression Son   . Stroke Paternal Aunt   . Heart disease Paternal Grandfather   . Diabetes Maternal Grandfather    No Known Allergies Current Outpatient Prescriptions on File Prior to Visit  Medication Sig Dispense Refill  . Alum & Mag Hydroxide-Simeth (MAGIC MOUTHWASH W/LIDOCAINE) SOLN Swish and spit out 10 ml by mouth up to 4 times daily  240 mL  0  . atorvastatin (LIPITOR) 20 MG tablet Take 1 tablet (20 mg total) by mouth daily.  90 tablet  1  . bumetanide (BUMEX) 1 MG tablet Take 1 tablet (1 mg total) by mouth 2 (two) times daily.  180 tablet  1  . buPROPion (WELLBUTRIN) 75 MG tablet Take 1 tablet (75 mg total) by mouth 2 (two) times daily.  180 tablet  0  .  Calcium Carbonate-Vitamin D (CALCIUM 600/VITAMIN D) 600-400 MG-UNIT per tablet Take 1 tablet by mouth 2 (two) times daily.        . calcium-vitamin D 185 MG TABS Take 185 mg by mouth daily.        . cholecalciferol (VITAMIN D) 1000 UNITS tablet Take 1 tablet (1,000 Units total) by mouth daily.  30 tablet  11  . cyclobenzaprine (FLEXERIL) 10 MG tablet Take 10 mg by mouth 2 (two) times daily as needed. As needed for back pain       . diclofenac sodium (VOLTAREN) 1 % GEL Apply as needed  1 Tube  0  . esomeprazole (NEXIUM) 40 MG capsule Take 1 capsule (40 mg total) by mouth daily.  90 capsule  0  . fexofenadine (ALLEGRA) 60 MG tablet Take 60 mg by mouth 2 (two) times daily.        . fluticasone (FLONASE) 50  MCG/ACT nasal spray Place 2 sprays into the nose daily.  48 g  1  . Fluticasone-Salmeterol (ADVAIR DISKUS) 100-50 MCG/DOSE AEPB Inhale 1 puff into the lungs 2 (two) times daily.  180 each  1  . HYDROcodone-acetaminophen (VICODIN) 5-500 MG per tablet Take 1 tablet by mouth every 6 (six) hours as needed.        Marland Kitchen levothyroxine (SYNTHROID, LEVOTHROID) 50 MCG tablet Take 1 tablet (50 mcg total) by mouth daily.  90 tablet  1  . metoprolol succinate (TOPROL XL) 25 MG 24 hr tablet Take 1 tablet (25 mg total) by mouth daily.  90 tablet  3  . nystatin (MYCOSTATIN) 100000 UNIT/ML suspension SWISH BY MOUTH 10 ML UP TO 4 TIMES A DAY  240 mL  0  . potassium chloride (K-DUR,KLOR-CON) 10 MEQ tablet Take 10 mEq by mouth 2 (two) times daily.      . Rivaroxaban (XARELTO) 20 MG TABS Take 1 tablet (20 mg total) by mouth daily.  90 tablet  3  . traZODone (DESYREL) 50 MG tablet Take 3 tablets by mouth at bedtime.  270 tablet  0  . vitamin E 1000 UNIT capsule Take 1,000 Units by mouth daily.         No current facility-administered medications on file prior to visit.    The PMH, PSH, Social History, Family History, Medications, and allergies have been reviewed in [facility], and have been updated if relevant.  ROS: See HPI Patient reports no  vision/ hearing changes,anorexia, weight change, fever ,adenopathy, persistant / recurrent hoarseness, swallowing issues, chest pain, edema,persistant / recurrent cough, hemoptysis, dyspnea(rest, exertional, paroxysmal nocturnal), gastrointestinal  bleeding (melena, rectal bleeding), abdominal pain, excessive heart burn, GU symptoms(dysuria, hematuria, pyuria, voiding/incontinence  Issues) syncope, focal weakness, severe memory loss, concerning skin lesions, depression, anxiety, abnormal bruising/bleeding, major joint swelling, breast masses or abnormal vaginal bleeding.     Physical exam: BP 120/80  Pulse 80  Temp(Src) 97.5 F (36.4 C)  Ht 5' 3.5" (1.613 m)  Wt 118 lb (53.524 kg)   BMI 20.57 kg/m2   General:  Well-developed,well-nourished,in no acute distress; alert,appropriate and cooperative throughout examination Head:  normocephalic and atraumatic.   Eyes:  vision grossly intact, pupils equal, pupils round, and pupils reactive to light.   Ears:  R ear normal and L ear normal.   Nose:  no external deformity.   Mouth:  good dentition.   Neck:  No deformities, masses, or tenderness noted. Lungs:  Normal respiratory effort, chest expands symmetrically. Lungs are clear to auscultation, no crackles or wheezes. Heart:  Normal rate and regular rhythm. S1 and S2 normal without gallop, murmur, click, rub or other extra sounds. Abdomen:  Bowel sounds positive,abdomen soft and non-tender without masses, organomegaly or hernias noted. Msk:  No deformity or scoliosis noted of thoracic or lumbar spine.   Extremities:  No clubbing, cyanosis, edema, or deformity noted with normal full range of motion of all joints.   Neurologic:  alert & oriented X3 and gait normal.   Skin:  Intact without suspicious lesions or rashes Cervical Nodes:  No lymphadenopathy noted Psych:  Cognition and judgment appear intact. Alert and cooperative with normal attention span and concentration. No apparent delusions, illusions, hallucination  Assessment and Plan:   1. Routine general medical examination at a health care facility The patients weight, height, BMI and visual acuity have been recorded in the chart I have made referrals, counseling and provided education to the patient based review of the above and I have provided the pt with a written personalized care plan for preventive services.   2. HYPOTHYROIDISM Stable on current dose of synthroid.  3. HYPERTENSION Well controlled on Toprol.  4. DEPRESSION Stable.  5. Special screening for malignant neoplasms, colon  - Fecal occult blood, imunochemical; Future  6. IBS (irritable bowel syndrome) Constipation persistent despite align,  beano. Will add Linzess.  She will call me in a few weeks with an update.    7. Atrial fibrillation Rate and rhythm controlled.  On Xarelto, metoprolol.

## 2013-04-11 NOTE — Patient Instructions (Addendum)
Great to see you, Ms. Francesco Runner. We are adding Linzess. Please call me or send me a message to let me know how it's working in a few weeks.  Please stop by the lab to get your stool cards.

## 2013-04-12 ENCOUNTER — Other Ambulatory Visit: Payer: Self-pay | Admitting: Family Medicine

## 2013-04-14 ENCOUNTER — Other Ambulatory Visit (INDEPENDENT_AMBULATORY_CARE_PROVIDER_SITE_OTHER): Payer: Medicare Other

## 2013-04-14 DIAGNOSIS — Z1211 Encounter for screening for malignant neoplasm of colon: Secondary | ICD-10-CM

## 2013-04-14 LAB — FECAL OCCULT BLOOD, IMMUNOCHEMICAL: Fecal Occult Bld: NEGATIVE

## 2013-04-15 ENCOUNTER — Encounter: Payer: Self-pay | Admitting: Family Medicine

## 2013-04-15 ENCOUNTER — Encounter: Payer: Self-pay | Admitting: *Deleted

## 2013-04-15 LAB — FECAL OCCULT BLOOD, GUAIAC: Fecal Occult Blood: NEGATIVE

## 2013-04-26 ENCOUNTER — Other Ambulatory Visit: Payer: Self-pay | Admitting: Family Medicine

## 2013-05-04 ENCOUNTER — Other Ambulatory Visit: Payer: Self-pay | Admitting: Family Medicine

## 2013-07-12 ENCOUNTER — Ambulatory Visit (INDEPENDENT_AMBULATORY_CARE_PROVIDER_SITE_OTHER): Payer: Medicare Other | Admitting: Cardiology

## 2013-07-12 ENCOUNTER — Ambulatory Visit (INDEPENDENT_AMBULATORY_CARE_PROVIDER_SITE_OTHER): Payer: Medicare Other | Admitting: Internal Medicine

## 2013-07-12 ENCOUNTER — Encounter: Payer: Self-pay | Admitting: Cardiology

## 2013-07-12 ENCOUNTER — Other Ambulatory Visit: Payer: Self-pay | Admitting: Family Medicine

## 2013-07-12 ENCOUNTER — Encounter: Payer: Self-pay | Admitting: Internal Medicine

## 2013-07-12 VITALS — BP 122/70 | HR 58 | Ht 64.5 in | Wt 122.4 lb

## 2013-07-12 VITALS — BP 138/62 | HR 51 | Temp 98.2°F | Wt 123.0 lb

## 2013-07-12 DIAGNOSIS — I4891 Unspecified atrial fibrillation: Secondary | ICD-10-CM

## 2013-07-12 DIAGNOSIS — I10 Essential (primary) hypertension: Secondary | ICD-10-CM

## 2013-07-12 DIAGNOSIS — D2322 Other benign neoplasm of skin of left ear and external auricular canal: Secondary | ICD-10-CM

## 2013-07-12 DIAGNOSIS — I679 Cerebrovascular disease, unspecified: Secondary | ICD-10-CM

## 2013-07-12 DIAGNOSIS — D2320 Other benign neoplasm of skin of unspecified ear and external auricular canal: Secondary | ICD-10-CM | POA: Insufficient documentation

## 2013-07-12 NOTE — Assessment & Plan Note (Signed)
Continue statin. Not on aspirin given need for anticoagulation. Followup carotid Dopplers January 2016. 

## 2013-07-12 NOTE — Progress Notes (Signed)
Subjective:    Patient ID: Patricia Peck, female    DOB: [birth year], 67 y.o.   MRN: [id number]  HPI Here with husband  Has spot in her right ear Planning to leave town for some weeks and wants it checked No apin Noticed with some itching and found it by touch  Current Outpatient Prescriptions on File Prior to Visit  Medication Sig Dispense Refill  . Alpha-D-Galactosidase (BEANO PO) Take by mouth 2 (two) times daily.      Marland Kitchen atorvastatin (LIPITOR) 20 MG tablet TAKE 1 TABLET (20 MG TOTAL) BY MOUTH DAILY.  90 tablet  1  . Bepotastine Besilate (BEPREVE) 1.5 % SOLN Use one drop in each eye twice a day      . bumetanide (BUMEX) 1 MG tablet TAKE 1 TABLET (1 MG TOTAL) BY MOUTH 2 (TWO) TIMES DAILY.  180 tablet  1  . buPROPion (WELLBUTRIN) 75 MG tablet TAKE 1 TABLET (75 MG TOTAL) BY MOUTH 2 (TWO) TIMES DAILY.  180 tablet  0  . Calcium Carbonate-Vitamin D (CALCIUM 600/VITAMIN D) 600-400 MG-UNIT per tablet Take 1 tablet by mouth 2 (two) times daily.        . calcium-vitamin D 185 MG TABS Take 185 mg by mouth daily.        . Cholecalciferol (VITAMIN D-3) 5000 UNITS TABS Take one tablet by mouth three times a week      . cyclobenzaprine (FLEXERIL) 10 MG tablet Take 10 mg by mouth 2 (two) times daily as needed. As needed for back pain       . diclofenac sodium (VOLTAREN) 1 % GEL Apply as needed  1 Tube  0  . famotidine (PEPCID AC) 10 MG chewable tablet Chew 10 mg by mouth daily.      . fexofenadine (ALLEGRA) 60 MG tablet Take 1 tablet (60 mg total) by mouth 2 (two) times daily.  60 tablet  3  . fluocinonide gel (LIDEX) 0.05 % Apply topically as needed.      . fluticasone (FLONASE) 50 MCG/ACT nasal spray Place 2 sprays into the nose daily.  48 g  1  . Fluticasone-Salmeterol (ADVAIR DISKUS) 100-50 MCG/DOSE AEPB Inhale 1 puff into the lungs 2 (two) times daily.  180 each  1  . HYDROcodone-acetaminophen (VICODIN) 5-500 MG per tablet Take 1 tablet by mouth every 6 (six) hours as needed.        Marland Kitchen  levothyroxine (SYNTHROID, LEVOTHROID) 50 MCG tablet TAKE 1 TABLET (50 MCG TOTAL) BY MOUTH DAILY.  90 tablet  1  . Melatonin 5 MG TABS Take 3 tablets my mouth at bedtime      . metoprolol succinate (TOPROL XL) 25 MG 24 hr tablet Take 1 tablet (25 mg total) by mouth daily.  90 tablet  3  . NEXIUM 40 MG capsule TAKE 1 CAPSULE (40 MG TOTAL) BY MOUTH DAILY.  90 capsule  1  . nystatin (MYCOSTATIN) 100000 UNIT/ML suspension SWISH BY MOUTH 10 ML UP TO 4 TIMES A DAY  240 mL  0  . potassium chloride (K-DUR,KLOR-CON) 10 MEQ tablet Take 10 mEq by mouth 2 (two) times daily.      . Rivaroxaban (XARELTO) 20 MG TABS Take 1 tablet (20 mg total) by mouth daily.  90 tablet  3  . traZODone (DESYREL) 50 MG tablet TAKE 3 TABLETS BY MOUTH AT BEDTIME.  270 tablet  0   No current facility-administered medications on file prior to visit.    No Known Allergies  Past  Medical History  Diagnosis Date  . Allergy   . Depression   . Hypertension   . Osteoporosis   . Hypothyroidism   . Asthma   . GERD (gastroesophageal reflux disease)   . Hyperlipidemia   . TIA (transient ischemic attack)   . Arthritis     hands  . Atrial fibrillation   . Atrial flutter     Past Surgical History  Procedure Laterality Date  . Partial hysterectomy  1989  . Colonoscopy    . Tee without cardioversion N/A 12/22/2012    Procedure: TRANSESOPHAGEAL ECHOCARDIOGRAM (TEE);  Surgeon: [name], MD;  Location: [facility] ENDOSCOPY;  Service: Cardiovascular;  Laterality: N/A;    Family History  Problem Relation Age of Onset  . Cancer Mother   . Drug abuse Son   . Depression Son   . Stroke Paternal Aunt   . Heart disease Paternal Grandfather   . Diabetes Maternal Grandfather     History   Social History  . Marital Status: Married    Spouse Name: N/A    Number of Children: 2  . Years of Education: N/A   Occupational History  .     Social History Main Topics  . Smoking status: Never Smoker   . Smokeless tobacco: Never Used   . Alcohol Use: 1.0 oz/week    2 drink(s) per week     Comment: Rare  . Drug Use: No  . Sexual Activity: Not on file   Other Topics Concern  . Not on file   Social History Narrative   Does have HPOA- sons Patricia Peck and Patricia Peck.   Does desire life support if not futile.   Not sure about feeding tubes.   Review of Systems No problems with hearing  No URI symptoms    Objective:   Physical Exam  Constitutional: She appears well-developed and well-nourished. No distress.  HENT:  ~16mm pearly cyst at base of inside of right pinna No inflammation  Canal looks fine          Assessment & Plan:

## 2013-07-12 NOTE — Assessment & Plan Note (Signed)
Blood pressure controlled.continue present medications. 

## 2013-07-12 NOTE — Assessment & Plan Note (Signed)
Patient remains in sinus. Continue Toprol. Continue xeralto. Check hemoglobin and renal function in December.

## 2013-07-12 NOTE — Patient Instructions (Signed)
Lab work in December 2014  Your physician wants you to follow-up in 12 months You will receive a reminder letter in the mail two months in advance. If you don't receive a letter, please call our office to schedule the follow-up appointment.

## 2013-07-12 NOTE — Assessment & Plan Note (Signed)
Discussed that this is benign No action needed Observe only

## 2013-07-12 NOTE — Progress Notes (Signed)
HPI: Pleasant female for fu of atrial fibrillation and TIA. Echocardiogram in January of 2014 showed normal LV function, mild left ventricular hypertrophy, mild aortic insufficiency. A patent foramen ovale could not be excluded. Carotid Dopplers in January of 2014 revealed 0-39% bilateral stenosis and followup recommended in 2 years. Brain MRI in January 2014 showed no acute infarct, mild small vessel disease. TEE in February of 2014. Her LV function was normal. There was mild mitral valve prolapse with mild mitral regurgitation. There was trace aortic insufficiency. There was no PFO. Monitor in March of 2014 showed paroxysmal atrial fibrillation/flutter. She was therefore started on anticoagulation. Since I last saw her in March of 2013 mild dyspnea on exertion but no orthopnea, PND, pedal edema, chest pain, syncope, bleeding or palpitations.   Current Outpatient Prescriptions  Medication Sig Dispense Refill  . Alpha-D-Galactosidase (BEANO PO) Take by mouth 2 (two) times daily.      . Alum & Mag Hydroxide-Simeth (MAGIC MOUTHWASH W/LIDOCAINE) SOLN Swish and spit out 10 ml by mouth up to 4 times daily  240 mL  0  . atorvastatin (LIPITOR) 20 MG tablet Take 1 tablet (20 mg total) by mouth daily.  90 tablet  1  . Bepotastine Besilate (BEPREVE) 1.5 % SOLN Use one drop in each eye twice a day      . bumetanide (BUMEX) 1 MG tablet Take 1 tablet (1 mg total) by mouth 2 (two) times daily.  180 tablet  1  . buPROPion (WELLBUTRIN) 75 MG tablet Take 1 tablet (75 mg total) by mouth 2 (two) times daily.  180 tablet  0  . Calcium Carbonate-Vitamin D (CALCIUM 600/VITAMIN D) 600-400 MG-UNIT per tablet Take 1 tablet by mouth 2 (two) times daily.        . calcium-vitamin D 185 MG TABS Take 185 mg by mouth daily.        . Cholecalciferol (VITAMIN D-3) 5000 UNITS TABS Take one tablet by mouth three times a week      . cyclobenzaprine (FLEXERIL) 10 MG tablet Take 10 mg by mouth 2 (two) times daily as needed. As needed  for back pain       . diclofenac sodium (VOLTAREN) 1 % GEL Apply as needed  1 Tube  0  . famotidine (PEPCID AC) 10 MG chewable tablet Chew 10 mg by mouth daily.      . fexofenadine (ALLEGRA) 60 MG tablet Take 1 tablet (60 mg total) by mouth 2 (two) times daily.  60 tablet  3  . fluocinonide gel (LIDEX) 0.05 % Apply topically as needed.      . fluticasone (FLONASE) 50 MCG/ACT nasal spray Place 2 sprays into the nose daily.  48 g  1  . Fluticasone-Salmeterol (ADVAIR DISKUS) 100-50 MCG/DOSE AEPB Inhale 1 puff into the lungs 2 (two) times daily.  180 each  1  . HYDROcodone-acetaminophen (VICODIN) 5-500 MG per tablet Take 1 tablet by mouth every 6 (six) hours as needed.        Marland Kitchen levothyroxine (SYNTHROID, LEVOTHROID) 50 MCG tablet Take 1 tablet (50 mcg total) by mouth daily.  90 tablet  1  . Melatonin 5 MG TABS Take 3 tablets my mouth at bedtime      . metoprolol succinate (TOPROL XL) 25 MG 24 hr tablet Take 1 tablet (25 mg total) by mouth daily.  90 tablet  3  . NEXIUM 40 MG capsule TAKE 1 CAPSULE (40 MG TOTAL) BY MOUTH DAILY.  90 capsule  1  .  nystatin (MYCOSTATIN) 100000 UNIT/ML suspension SWISH BY MOUTH 10 ML UP TO 4 TIMES A DAY  240 mL  0  . potassium chloride (K-DUR,KLOR-CON) 10 MEQ tablet Take 10 mEq by mouth 2 (two) times daily.      . Rivaroxaban (XARELTO) 20 MG TABS Take 1 tablet (20 mg total) by mouth daily.  90 tablet  3  . traZODone (DESYREL) 50 MG tablet TAKE 3 TABLETS BY MOUTH AT BEDTIME.  270 tablet  0   No current facility-administered medications for this visit.     Past Medical History  Diagnosis Date  . Allergy   . Depression   . Hypertension   . Osteoporosis   . Hypothyroidism   . Asthma   . GERD (gastroesophageal reflux disease)   . Hyperlipidemia   . TIA (transient ischemic attack)   . Arthritis     hands  . Atrial fibrillation   . Atrial flutter     Past Surgical History  Procedure Laterality Date  . Partial hysterectomy  1989  . Colonoscopy    . Tee  without cardioversion N/A 12/22/2012    Procedure: TRANSESOPHAGEAL ECHOCARDIOGRAM (TEE);  Surgeon: [name], MD;  Location: [facility] ENDOSCOPY;  Service: Cardiovascular;  Laterality: N/A;    History   Social History  . Marital Status: Married    Spouse Name: N/A    Number of Children: 2  . Years of Education: N/A   Occupational History  .     Social History Main Topics  . Smoking status: Never Smoker   . Smokeless tobacco: Never Used  . Alcohol Use: 1.0 oz/week    2 drink(s) per week     Comment: Rare  . Drug Use: No  . Sexual Activity: Not on file   Other Topics Concern  . Not on file   Social History Narrative   Does have [name].   Does desire life support if not futile.   Not sure about feeding tubes.    ROS: no fevers or chills, productive cough, hemoptysis, dysphasia, odynophagia, melena, hematochezia, dysuria, hematuria, rash, seizure activity, orthopnea, PND, pedal edema, claudication. Remaining systems are negative.  Physical Exam: Well-developed well-nourished in no acute distress.  Skin is warm and dry.  HEENT is normal.  Neck is supple.  Chest is clear to auscultation with normal expansion.  Cardiovascular exam is regular rate and rhythm.  Abdominal exam nontender or distended. No masses palpated. Extremities show no edema. neuro grossly intact  ECG sinus rhythm at a rate of 58. No ST changes.

## 2013-07-26 ENCOUNTER — Ambulatory Visit: Payer: Medicare Other | Admitting: Cardiology

## 2013-08-10 ENCOUNTER — Telehealth: Payer: Self-pay

## 2013-08-10 MED ORDER — FEXOFENADINE HCL 60 MG PO TABS: 60.0000 mg | ORAL_TABLET | Freq: Two times a day (BID) | ORAL | Status: DC

## 2013-08-10 NOTE — Telephone Encounter (Signed)
Left message for pt

## 2013-08-10 NOTE — Telephone Encounter (Signed)
Pt request prescription med for allergies; runny nose but no fever be sent to [facility] [address]. Pt said has tried several OTC meds that were not effective. Pt said has allergies year round. Pt will call for flu shot clinic appt and pt request PA to be started for Nexium. Advised pt will contact pharmacy to begin PA process.

## 2013-08-10 NOTE — Telephone Encounter (Signed)
Rx for fexofenadine sent to her pharmacy.

## 2013-08-22 MED ORDER — FLUTICASONE PROPIONATE 50 MCG/ACT NA SUSP: 2.0000 | Freq: Every day | NASAL | Status: DC

## 2013-08-22 MED ORDER — FLUTICASONE-SALMETEROL 100-50 MCG/DOSE IN AEPB: 1.0000 | INHALATION_SPRAY | Freq: Two times a day (BID) | RESPIRATORY_TRACT | Status: DC

## 2013-08-22 NOTE — Addendum Note (Signed)
Addended by: Patience Musca on: 08/22/2013 02:57 PM   Modules accepted: Orders

## 2013-08-22 NOTE — Telephone Encounter (Addendum)
Pt left v/m; pt has tried the prescription Allegra and it is not effective for pt's allergies. Pt request a prescription med that is not OTC also sent to [facility] [address]. Pt also wants status of Nexium PA. Had spoken with PA rep at [phone] requesting PA form; did not receive. Called [phone] again and spoke with [place]; PA approved over phone from 06/22/13 -06/22/14. No confirmation # needed. Spoke with Tammy at [employer] and rx went thru last month. Pt also request Advair 100-50 and Flonase to [facility] [address]; advised pt refilled.

## 2013-08-23 NOTE — Telephone Encounter (Signed)
Has she ever tried clarinex? This is not OTC.

## 2013-08-23 NOTE — Telephone Encounter (Signed)
Called and spoke with Mrs. Patricia Peck.  She has tried Clarinex in the past and it did not work well for her.  It seemed everything we offered, patient had tried in the past.  States she just got off cruise ship and her nose is running non stop and she has a dry cough.  Offered appointment for tomorrow and Mrs. Patricia Peck is willing to do that.  Appointment scheduled with Dr. Patsy Lager 08/24/2013 @ 11:30am.

## 2013-08-23 NOTE — Telephone Encounter (Signed)
Dr Dayton Martes is out. Would you take care of the request for a prescriptions for her allergies?  See previous note.

## 2013-08-24 ENCOUNTER — Ambulatory Visit: Payer: Medicare Other

## 2013-08-24 ENCOUNTER — Ambulatory Visit (INDEPENDENT_AMBULATORY_CARE_PROVIDER_SITE_OTHER): Payer: Medicare Other | Admitting: Family Medicine

## 2013-08-24 ENCOUNTER — Encounter: Payer: Self-pay | Admitting: Family Medicine

## 2013-08-24 VITALS — BP 110/62 | HR 68 | Temp 97.7°F | Ht 64.5 in | Wt 121.5 lb

## 2013-08-24 DIAGNOSIS — H1045 Other chronic allergic conjunctivitis: Secondary | ICD-10-CM | POA: Insufficient documentation

## 2013-08-24 DIAGNOSIS — J309 Allergic rhinitis, unspecified: Secondary | ICD-10-CM

## 2013-08-24 DIAGNOSIS — Z23 Encounter for immunization: Secondary | ICD-10-CM

## 2013-08-24 DIAGNOSIS — J302 Other seasonal allergic rhinitis: Secondary | ICD-10-CM | POA: Insufficient documentation

## 2013-08-24 MED ORDER — DICLOFENAC SODIUM 1 % TD GEL: TRANSDERMAL | Status: DC

## 2013-08-24 MED ORDER — LEVOCETIRIZINE DIHYDROCHLORIDE 5 MG PO TABS: 5.0000 mg | ORAL_TABLET | Freq: Every evening | ORAL | Status: DC

## 2013-08-24 MED ORDER — NYSTATIN 100000 UNIT/ML MT SUSP: OROMUCOSAL | Status: DC

## 2013-08-24 MED ORDER — FLUTICASONE-SALMETEROL 100-50 MCG/DOSE IN AEPB: 1.0000 | INHALATION_SPRAY | Freq: Two times a day (BID) | RESPIRATORY_TRACT | Status: DC

## 2013-08-24 MED ORDER — MAGIC MOUTHWASH W/LIDOCAINE: 10.0000 mL | Freq: Four times a day (QID) | ORAL | Status: DC | PRN

## 2013-08-24 MED ORDER — TRIAMCINOLONE ACETONIDE 55 MCG/ACT NA AERO: 2.0000 | INHALATION_SPRAY | Freq: Every day | NASAL | Status: DC

## 2013-08-24 NOTE — Progress Notes (Signed)
Date:  08/24/2013   Name:  Patricia Peck   DOB:  07/12/1946   MRN:  [id number] Gender: female Age: 67 y.o.  Primary Physician:  Ruthe Mannan, MD   Chief Complaint: Nasal Congestion, Cough and Sneezing   History of Present Illness:  Patricia Peck is a 67 y.o. pleasant patient who presents with the following:  Here with allergy flare-up:  Was on allergy shots about tn or fiifteen years ago. Wanted to put on Allegra and has been for 10 years or so.  Claritin, Zyrtec, Benadryl, Alavert.   Eyes will - on some bepreve drops.   Uses nasal flonase and advair.   Patient Active Problem List   Diagnosis Date Noted  . Cyst, dermoid, ear 07/12/2013  . IBS (irritable bowel syndrome) 04/11/2013  . Routine general medical examination at a health care facility 03/31/2013  . GI symptoms 03/07/2013  . Atrial fibrillation 01/27/2013  . Cerebrovascular disease 01/27/2013  . TIA (transient ischemic attack) 12/14/2012  . Confusion 11/22/2012  . Dizziness 11/22/2012  . Insomnia 11/02/2012  . GERD 03/12/2010  . HYPOTHYROIDISM 04/14/2007  . DEPRESSION 04/14/2007  . HYPERTENSION 04/14/2007  . OSTEOPENIA 04/14/2007    Past Medical History  Diagnosis Date  . Allergy   . Depression   . Hypertension   . Osteoporosis   . Hypothyroidism   . Asthma   . GERD (gastroesophageal reflux disease)   . Hyperlipidemia   . TIA (transient ischemic attack)   . Arthritis     hands  . Atrial fibrillation   . Atrial flutter     Past Surgical History  Procedure Laterality Date  . Partial hysterectomy  1989  . Colonoscopy    . Tee without cardioversion N/A 12/22/2012    Procedure: TRANSESOPHAGEAL ECHOCARDIOGRAM (TEE);  Surgeon: [name], MD;  Location: [facility] ENDOSCOPY;  Service: Cardiovascular;  Laterality: N/A;    History   Social History  . Marital Status: Married    Spouse Name: N/A    Number of Children: 2  . Years of Education: N/A   Occupational History  .     Social History  Main Topics  . Smoking status: Never Smoker   . Smokeless tobacco: Never Used  . Alcohol Use: 1.0 oz/week    2 drink(s) per week     Comment: Rare  . Drug Use: No  . Sexual Activity: Not on file   Other Topics Concern  . Not on file   Social History Narrative   Does have [name].   Does desire life support if not futile.   Not sure about feeding tubes.    Family History  Problem Relation Age of Onset  . Cancer Mother   . Drug abuse Son   . Depression Son   . Stroke Paternal Aunt   . Heart disease Paternal Grandfather   . Diabetes Maternal Grandfather     No Known Allergies  Medication list has been reviewed and updated.  Outpatient Prescriptions Prior to Visit  Medication Sig Dispense Refill  . Alpha-D-Galactosidase (BEANO PO) Take by mouth 2 (two) times daily.      Marland Kitchen atorvastatin (LIPITOR) 20 MG tablet TAKE 1 TABLET (20 MG TOTAL) BY MOUTH DAILY.  90 tablet  1  . Bepotastine Besilate (BEPREVE) 1.5 % SOLN Use one drop in each eye twice a day      . bumetanide (BUMEX) 1 MG tablet TAKE 1 TABLET (1 MG TOTAL) BY MOUTH 2 (TWO)  TIMES DAILY.  180 tablet  1  . buPROPion (WELLBUTRIN) 75 MG tablet TAKE 1 TABLET (75 MG TOTAL) BY MOUTH 2 (TWO) TIMES DAILY.  180 tablet  0  . Calcium Carbonate-Vitamin D (CALCIUM 600/VITAMIN D) 600-400 MG-UNIT per tablet Take 1 tablet by mouth 2 (two) times daily.        . calcium-vitamin D 185 MG TABS Take 185 mg by mouth daily.        . Cholecalciferol (VITAMIN D-3) 5000 UNITS TABS Take one tablet by mouth three times a week      . cyclobenzaprine (FLEXERIL) 10 MG tablet Take 10 mg by mouth 2 (two) times daily as needed. As needed for back pain       . diclofenac sodium (VOLTAREN) 1 % GEL Apply as needed  1 Tube  0  . famotidine (PEPCID AC) 10 MG chewable tablet Chew 10 mg by mouth daily.      . fexofenadine (ALLEGRA) 60 MG tablet Take 1 tablet (60 mg total) by mouth 2 (two) times daily.  60 tablet  3  . fluocinonide gel (LIDEX) 0.05 %  Apply topically as needed.      . fluticasone (FLONASE) 50 MCG/ACT nasal spray Place 2 sprays into the nose daily.  48 g  1  . Fluticasone-Salmeterol (ADVAIR DISKUS) 100-50 MCG/DOSE AEPB Inhale 1 puff into the lungs 2 (two) times daily.  180 each  1  . HYDROcodone-acetaminophen (VICODIN) 5-500 MG per tablet Take 1 tablet by mouth every 6 (six) hours as needed.        Marland Kitchen levothyroxine (SYNTHROID, LEVOTHROID) 50 MCG tablet TAKE 1 TABLET (50 MCG TOTAL) BY MOUTH DAILY.  90 tablet  1  . Melatonin 5 MG TABS Take 3 tablets my mouth at bedtime      . metoprolol succinate (TOPROL XL) 25 MG 24 hr tablet Take 1 tablet (25 mg total) by mouth daily.  90 tablet  3  . NEXIUM 40 MG capsule TAKE 1 CAPSULE (40 MG TOTAL) BY MOUTH DAILY.  90 capsule  1  . nystatin (MYCOSTATIN) 100000 UNIT/ML suspension SWISH BY MOUTH 10 ML UP TO 4 TIMES A DAY  240 mL  0  . potassium chloride (K-DUR,KLOR-CON) 10 MEQ tablet Take 10 mEq by mouth 2 (two) times daily.      . Rivaroxaban (XARELTO) 20 MG TABS Take 1 tablet (20 mg total) by mouth daily.  90 tablet  3  . traZODone (DESYREL) 50 MG tablet TAKE 3 TABLETS BY MOUTH AT BEDTIME.  270 tablet  0   No facility-administered medications prior to visit.    Review of Systems:   GEN: No acute illnesses, no fevers, chills. GI: No n/v/d, eating normally Pulm: No SOB Interactive and getting along well at home.  Otherwise, ROS is as per the HPI.   Physical Examination: BP 110/62  Pulse 68  Temp(Src) 97.7 F (36.5 C) (Oral)  Ht 5' 4.5" (1.638 m)  Wt 121 lb 8 oz (55.112 kg)  BMI 20.54 kg/m2  SpO2 96%  Ideal Body Weight: Weight in (lb) to have BMI = 25: 147.6   GEN: WDWN, NAD, Non-toxic, Alert & Oriented x 3 HEENT: Atraumatic, Normocephalic.  Ears and Nose: No external deformity. Boggy turbinates. EXTR: No clubbing/cyanosis/edema NEURO: Normal gait.  PSYCH: Normally interactive. Conversant. Not depressed or anxious appearing.  Calm demeanor.    Assessment and  Plan:  Allergic rhinitis  Need for prophylactic vaccination and inoculation against influenza - Plan: Flu Vaccine QUAD 36+  mos PF IM (Fluarix)  Multiple prescriptions are refilled for the patient. Also suggested changing to Xyzal and changing to Nasacort given that her allergies are flared  Orders Today:  Orders Placed This Encounter  Procedures  . Flu Vaccine QUAD 36+ mos PF IM (Fluarix)    Updated Medication List: (Includes new medications, updates to list, dose adjustments) Meds ordered this encounter  Medications  . DISCONTD: Alum & Mag Hydroxide-Simeth (MAGIC MOUTHWASH W/LIDOCAINE) SOLN    Sig: Swish and spit out 10 ml by mouth 4 times a day  . diclofenac sodium (VOLTAREN) 1 % GEL    Sig: Apply as needed    Dispense:  9 Tube    Refill:  3  . nystatin (MYCOSTATIN) 100000 UNIT/ML suspension    Sig: SWISH BY MOUTH 10 ML UP TO 4 TIMES A DAY    Dispense:  240 mL    Refill:  1  . Fluticasone-Salmeterol (ADVAIR DISKUS) 100-50 MCG/DOSE AEPB    Sig: Inhale 1 puff into the lungs 2 (two) times daily.    Dispense:  180 each    Refill:  1  . Alum & Mag Hydroxide-Simeth (MAGIC MOUTHWASH W/LIDOCAINE) SOLN    Sig: Take 10 mLs by mouth 4 (four) times daily as needed. Swish and spit out 10 ml by mouth 4 times a day    Dispense:  240 mL    Refill:  0  . levocetirizine (XYZAL) 5 MG tablet    Sig: Take 1 tablet (5 mg total) by mouth every evening.    Dispense:  90 tablet    Refill:  3  . triamcinolone (NASACORT) 55 MCG/ACT AERO nasal inhaler    Sig: Place 2 sprays into the nose daily.    Dispense:  3 Inhaler    Refill:  3    Medications Discontinued: Medications Discontinued During This Encounter  Medication Reason  . diclofenac sodium (VOLTAREN) 1 % GEL Reorder  . nystatin (MYCOSTATIN) 100000 UNIT/ML suspension Reorder  . Fluticasone-Salmeterol (ADVAIR DISKUS) 100-50 MCG/DOSE AEPB Reorder  . Alum & Mag Hydroxide-Simeth (MAGIC MOUTHWASH W/LIDOCAINE) SOLN Reorder       Signed,  Karleen Hampshire T. Dewain Platz, MD, CAQ Sports Medicine  [employer] [facility] [address] [place] [address] Phone: [phone] Fax: [phone]

## 2013-08-31 ENCOUNTER — Telehealth: Payer: Self-pay

## 2013-08-31 DIAGNOSIS — J309 Allergic rhinitis, unspecified: Secondary | ICD-10-CM

## 2013-08-31 NOTE — Telephone Encounter (Signed)
done

## 2013-08-31 NOTE — Telephone Encounter (Signed)
Pt left v/m; pt seen 08/24/13 and medication is not helping allergies; pt request referral to allergy specialist before end of year.Please advise.

## 2013-10-11 ENCOUNTER — Other Ambulatory Visit (INDEPENDENT_AMBULATORY_CARE_PROVIDER_SITE_OTHER): Payer: Medicare Other

## 2013-10-11 DIAGNOSIS — I10 Essential (primary) hypertension: Secondary | ICD-10-CM

## 2013-10-11 DIAGNOSIS — I4891 Unspecified atrial fibrillation: Secondary | ICD-10-CM

## 2013-10-11 LAB — BASIC METABOLIC PANEL
CO2: 27 mEq/L (ref 19–32)
Calcium: 8.7 mg/dL (ref 8.4–10.5)
Chloride: 100 mEq/L (ref 96–112)
GFR: 71.59 mL/min (ref >60.00)
Potassium: 3.5 mEq/L (ref 3.5–5.1)
Sodium: 135 mEq/L (ref 135–145)

## 2013-10-11 LAB — CBC WITH DIFFERENTIAL/PLATELET
Basophils Absolute: 0.1 10*3/uL (ref 0.0–0.1)
Eosinophils Absolute: 0.3 10*3/uL (ref 0.0–0.7)
HCT: 37 % (ref 36.0–46.0)
Hemoglobin: 12.4 g/dL (ref 12.0–15.0)
Lymphocytes Relative: 29.8 % (ref 12.0–46.0)
Monocytes Relative: 9.7 % (ref 3.0–12.0)
Neutro Abs: 3.6 10*3/uL (ref 1.4–7.7)
Neutrophils Relative %: 55 % (ref 43.0–77.0)
Platelets: 283 10*3/uL (ref 150.0–400.0)
RDW: 13.2 % (ref 11.5–14.6)

## 2013-10-13 ENCOUNTER — Other Ambulatory Visit: Payer: Self-pay | Admitting: Family Medicine

## 2013-10-13 ENCOUNTER — Telehealth: Payer: Self-pay | Admitting: Cardiology

## 2013-10-13 ENCOUNTER — Encounter: Payer: Self-pay | Admitting: *Deleted

## 2013-10-13 NOTE — Telephone Encounter (Signed)
New message ° ° ° °Returning Patricia Peck's call °

## 2013-10-13 NOTE — Telephone Encounter (Signed)
Last office visit 08/24/2013 with Dr. Patsy Lager.  Ok to refill?

## 2013-10-13 NOTE — Telephone Encounter (Signed)
Spoke with pt, aware of lab results. mychart activation code given to pt.

## 2013-10-14 ENCOUNTER — Encounter: Payer: Self-pay | Admitting: Family Medicine

## 2013-11-23 ENCOUNTER — Ambulatory Visit (INDEPENDENT_AMBULATORY_CARE_PROVIDER_SITE_OTHER): Payer: Medicare Other | Admitting: Family Medicine

## 2013-11-23 ENCOUNTER — Encounter: Payer: Self-pay | Admitting: Family Medicine

## 2013-11-23 VITALS — BP 124/70 | HR 60 | Temp 97.5°F | Wt 127.5 lb

## 2013-11-23 DIAGNOSIS — H6120 Impacted cerumen, unspecified ear: Secondary | ICD-10-CM | POA: Insufficient documentation

## 2013-11-23 NOTE — Assessment & Plan Note (Signed)
Much improved with curette and irrigation, f/u prn.  She agrees. No complications.

## 2013-11-23 NOTE — Patient Instructions (Signed)
Take care.  Your ear symptoms should gradually improve.

## 2013-11-23 NOTE — Progress Notes (Signed)
Pre-visit discussion using our clinic review tool. No additional management support is needed unless otherwise documented below in the visit note.  R ear sx.  Started about 2 days. She had some external ear drainage. Ear is stuffy.  Trouble hearing on the R side.  L side feels normal.  No other URI sx.   Meds, vitals, and allergies reviewed.   ROS: See HPI.  Otherwise, noncontributory.  nad ncat R cerumen impaction, almost fully resolved with irrigation and curette, tolerated well. Residual irritation in the canal is mild. Doesn't appear infected. TM wnl L canal and TM wnl Nasal and op exam wnl Hearing grossly wnl on check with tuning fork

## 2013-12-20 ENCOUNTER — Encounter: Payer: Self-pay | Admitting: Gastroenterology

## 2014-01-11 ENCOUNTER — Other Ambulatory Visit: Payer: Self-pay | Admitting: Cardiology

## 2014-01-11 ENCOUNTER — Other Ambulatory Visit: Payer: Self-pay | Admitting: Internal Medicine

## 2014-01-11 ENCOUNTER — Other Ambulatory Visit: Payer: Self-pay | Admitting: Family Medicine

## 2014-01-11 NOTE — Telephone Encounter (Signed)
Last filled 12/14--please advise

## 2014-01-18 ENCOUNTER — Other Ambulatory Visit: Payer: Self-pay

## 2014-01-18 MED ORDER — LEVOTHYROXINE SODIUM 50 MCG PO TABS: ORAL_TABLET | ORAL | Status: DC

## 2014-01-18 MED ORDER — ATORVASTATIN CALCIUM 20 MG PO TABS: ORAL_TABLET | ORAL | Status: DC

## 2014-01-18 NOTE — Telephone Encounter (Signed)
Pt request 90 day refill of atorvastatin and levothyroxine to [facility]. Pt already scheduled for labs and CPX 04/2014. Pt said will call pharmacy when need refill next month.

## 2014-01-19 ENCOUNTER — Telehealth: Payer: Self-pay | Admitting: *Deleted

## 2014-01-19 NOTE — Telephone Encounter (Signed)
Pharmacy notified.

## 2014-01-19 NOTE — Telephone Encounter (Signed)
PA to [employer] XARELTO through 01/20/2015, [place] # [phone]

## 2014-01-19 NOTE — Telephone Encounter (Signed)
PA to [facility] for patient xarelto.

## 2014-03-05 ENCOUNTER — Other Ambulatory Visit: Payer: Self-pay | Admitting: Family Medicine

## 2014-03-06 ENCOUNTER — Encounter: Payer: Self-pay | Admitting: Gastroenterology

## 2014-03-07 ENCOUNTER — Telehealth: Payer: Self-pay

## 2014-03-07 NOTE — Telephone Encounter (Signed)
I would like to see her to evaluate this pain.  Please schedule OV.

## 2014-03-07 NOTE — Telephone Encounter (Signed)
Left message with son to have patient call back.

## 2014-03-07 NOTE — Telephone Encounter (Signed)
Patient notified as instructed by telephone. Offered patient an appointment Thursday which she could not take because of another appointment. Appointment scheduled for Monday.

## 2014-03-07 NOTE — Telephone Encounter (Signed)
Pt left v/m; 2 x in March and 2 x in April pt was awakened by sharp pain in back that radiated around into lt breast. Pt shortly went away. Pt has not recently had pain. Pt scheduled for mammogram on 03/09/14. pts hair dresser advised pt could be heart related and pt wanted Dr Hulen Shouts opinion of what to do. Unable to reach pt by contact #s.Please advise.

## 2014-03-10 ENCOUNTER — Encounter: Payer: Self-pay | Admitting: Family Medicine

## 2014-03-13 ENCOUNTER — Ambulatory Visit (INDEPENDENT_AMBULATORY_CARE_PROVIDER_SITE_OTHER): Payer: Medicare Other | Admitting: Family Medicine

## 2014-03-13 ENCOUNTER — Encounter: Payer: Self-pay | Admitting: Family Medicine

## 2014-03-13 VITALS — BP 122/76 | HR 61 | Temp 97.3°F | Wt 122.2 lb

## 2014-03-13 DIAGNOSIS — R079 Chest pain, unspecified: Secondary | ICD-10-CM

## 2014-03-13 DIAGNOSIS — K219 Gastro-esophageal reflux disease without esophagitis: Secondary | ICD-10-CM

## 2014-03-13 DIAGNOSIS — G4700 Insomnia, unspecified: Secondary | ICD-10-CM

## 2014-03-13 MED ORDER — ESOMEPRAZOLE MAGNESIUM 40 MG PO CPDR: DELAYED_RELEASE_CAPSULE | ORAL | Status: DC

## 2014-03-13 MED ORDER — ZOLPIDEM TARTRATE 5 MG PO TABS: 5.0000 mg | ORAL_TABLET | Freq: Every evening | ORAL | Status: DC | PRN

## 2014-03-13 MED ORDER — NEXIUM 40 MG PO CPDR: DELAYED_RELEASE_CAPSULE | ORAL | Status: DC

## 2014-03-13 NOTE — Addendum Note (Signed)
Addended by: Modena Nunnery on: 03/13/2014 12:15 PM   Modules accepted: Orders

## 2014-03-13 NOTE — Progress Notes (Signed)
Pre visit review using our clinic review tool, if applicable. No additional management support is needed unless otherwise documented below in the visit note. 

## 2014-03-13 NOTE — Progress Notes (Signed)
Subjective:   Patient ID: Patricia Peck, female    DOB: January 08, 1946, 68 y.o.   MRN: [id number]  Patricia Peck is a pleasant 68 y.o. year old female who presents to clinic today with Shoulder Pain  on 03/13/2014  HPI: Here with her husband today to discuss several issues.  Left sided chest pain- radiated to back while sleeping.  Occurred 4 times over six weeks.  Never occurs with exertion- very active.  Just got back from two back to back cruises.  Plays tennis daily.  No SOB.  Pain would wake her up at night- very sharp,last a minute or two and resolved once she sat up.  Mammogram next last week.  Lab Results  Component Value Date   CHOL 166 04/04/2013   HDL 51.40 04/04/2013   LDLCALC 95 04/04/2013   LDLDIRECT 129.2 05/28/2007   TRIG 98.0 04/04/2013   CHOLHDL 3 04/04/2013    Does feel her heart burn is worse since she stopped taking Nexium.  Insomnia- trying melatonin and Trazadone (maxed out).  Cannot "keep up when I don't sleep."  Would like to discuss "a real sleep aid."  Patient Active Problem List   Diagnosis Date Noted  . Chest pain 03/13/2014  . Cerumen impaction 11/23/2013  . Allergic rhinitis 08/24/2013  . Cyst, dermoid, ear 07/12/2013  . IBS (irritable bowel syndrome) 04/11/2013  . Routine general medical examination at a health care facility 03/31/2013  . GI symptoms 03/07/2013  . Atrial fibrillation 01/27/2013  . Cerebrovascular disease 01/27/2013  . TIA (transient ischemic attack) 12/14/2012  . Confusion 11/22/2012  . Dizziness 11/22/2012  . Insomnia 11/02/2012  . GERD 03/12/2010  . HYPOTHYROIDISM 04/14/2007  . DEPRESSION 04/14/2007  . HYPERTENSION 04/14/2007  . OSTEOPENIA 04/14/2007   Past Medical History  Diagnosis Date  . Allergy   . Depression   . Hypertension   . Osteoporosis   . Hypothyroidism   . Asthma   . GERD (gastroesophageal reflux disease)   . Hyperlipidemia   . TIA (transient ischemic attack)   . Arthritis     hands  . Atrial  fibrillation   . Atrial flutter    Past Surgical History  Procedure Laterality Date  . Partial hysterectomy  1989  . Colonoscopy    . Tee without cardioversion N/A 12/22/2012    Procedure: TRANSESOPHAGEAL ECHOCARDIOGRAM (TEE);  Surgeon: Lelon Perla, MD;  Location: [facility] ENDOSCOPY;  Service: Cardiovascular;  Laterality: N/A;   History  Substance Use Topics  . Smoking status: Never Smoker   . Smokeless tobacco: Never Used  . Alcohol Use: 1.0 oz/week    2 drink(s) per week     Comment: Rare   Family History  Problem Relation Age of Onset  . Cancer Mother   . Drug abuse Son   . Depression Son   . Stroke Paternal Aunt   . Heart disease Paternal Grandfather   . Diabetes Maternal Grandfather    No Known Allergies Current Outpatient Prescriptions on File Prior to Visit  Medication Sig Dispense Refill  . Alpha-D-Galactosidase (BEANO PO) Take by mouth 2 (two) times daily.      . Alum & Mag Hydroxide-Simeth (MAGIC MOUTHWASH W/LIDOCAINE) SOLN Take 10 mLs by mouth 4 (four) times daily as needed. Swish and spit out 10 ml by mouth 4 times a day  240 mL  0  . atorvastatin (LIPITOR) 20 MG tablet TAKE 1 TABLET (20 MG TOTAL) BY MOUTH DAILY.  90 tablet  0  .  Bepotastine Besilate (BEPREVE) 1.5 % SOLN Use one drop in each eye twice a day      . bumetanide (BUMEX) 1 MG tablet TAKE 1 TABLET (1 MG TOTAL) BY MOUTH 2 (TWO) TIMES DAILY.  180 tablet  0  . buPROPion (WELLBUTRIN) 75 MG tablet TAKE 1 TABLET (75 MG TOTAL) BY MOUTH 2 (TWO) TIMES DAILY.  180 tablet  0  . Calcium Carbonate-Vitamin D (CALCIUM 600/VITAMIN D) 600-400 MG-UNIT per tablet Take 1 tablet by mouth 2 (two) times daily.        . calcium-vitamin D 185 MG TABS Take 185 mg by mouth daily.        . Cholecalciferol (VITAMIN D-3) 5000 UNITS TABS Take one tablet by mouth three times a week      . cyclobenzaprine (FLEXERIL) 10 MG tablet Take 10 mg by mouth 2 (two) times daily as needed. As needed for back pain       . diclofenac sodium (VOLTAREN)  1 % GEL Apply as needed  9 Tube  3  . famotidine (PEPCID AC) 10 MG chewable tablet Chew 10 mg by mouth daily.      . fexofenadine (ALLEGRA) 60 MG tablet Take 1 tablet (60 mg total) by mouth 2 (two) times daily.  60 tablet  3  . fluocinonide gel (LIDEX) 0.05 % Apply topically as needed.      . fluticasone (FLONASE) 50 MCG/ACT nasal spray Place 2 sprays into the nose daily.  48 g  1  . Fluticasone-Salmeterol (ADVAIR DISKUS) 100-50 MCG/DOSE AEPB Inhale 1 puff into the lungs 2 (two) times daily.  180 each  1  . HYDROcodone-acetaminophen (VICODIN) 5-500 MG per tablet Take 1 tablet by mouth every 6 (six) hours as needed.        Marland Kitchen levocetirizine (XYZAL) 5 MG tablet Take 1 tablet (5 mg total) by mouth every evening.  90 tablet  3  . levothyroxine (SYNTHROID, LEVOTHROID) 50 MCG tablet TAKE 1 TABLET BY MOUTH DAILY  30 tablet  1  . Melatonin 5 MG TABS Take 3 tablets my mouth at bedtime      . metoprolol succinate (TOPROL-XL) 25 MG 24 hr tablet TAKE 1 TABLET (25 MG TOTAL) BY MOUTH DAILY.  90 tablet  1  . nystatin (MYCOSTATIN) 100000 UNIT/ML suspension SWISH BY MOUTH 10 ML UP TO 4 TIMES A DAY  240 mL  1  . potassium chloride (K-DUR,KLOR-CON) 10 MEQ tablet Take 10 mEq by mouth 2 (two) times daily.      . traZODone (DESYREL) 50 MG tablet TAKE 3 TABLETS AT BEDTIME.  270 tablet  0  . triamcinolone (NASACORT) 55 MCG/ACT AERO nasal inhaler Place 2 sprays into the nose daily.  3 Inhaler  3  . XARELTO 20 MG TABS tablet TAKE 1 TABLET (20 MG TOTAL) BY MOUTH DAILY.  90 tablet  1   No current facility-administered medications on file prior to visit.   The PMH, PSH, Social History, Family History, Medications, and allergies have been reviewed in [facility], and have been updated if relevant.   Review of Systems    See HPI No nausea or diaphoresis with CP Objective:    BP 122/76  Pulse 61  Temp(Src) 97.3 F (36.3 C) (Oral)  Wt 122 lb 4 oz (55.452 kg)  SpO2 98%   Physical Exam   General:   Well-developed,well-nourished,in no acute distress; alert,appropriate and cooperative throughout examination Head:  normocephalic and atraumatic.   Eyes:  vision grossly intact, pupils equal, pupils  round, and pupils reactive to light.   Lungs:  Normal respiratory effort, chest expands symmetrically. Lungs are clear to auscultation, no crackles or wheezes. Heart:  Normal rate and regular rhythm. S1 and S2 normal without gallop, murmur, click, rub or other extra sounds. Abdomen:  Bowel sounds positive,abdomen soft and non-tender without masses, organomegaly or hernias noted. Msk:  No deformity or scoliosis noted of thoracic or lumbar spine.   Extremities:  No clubbing, cyanosis, edema, or deformity noted with normal full range of motion of all joints.   Neurologic:  alert & oriented X3 and gait normal.   Psych:  Cognition and judgment appear intact. Alert and cooperative with normal attention span and concentration. No apparent delusions, illusions, hallucinations       Assessment & Plan:   Chest pain  GERD  Insomnia No Follow-up on file.

## 2014-03-13 NOTE — Assessment & Plan Note (Signed)
EKG reassuring- NSR Most likely related to her GERD. Restart Nexium.  Non smoker, taking lipitor.  On Xarelto.

## 2014-03-13 NOTE — Assessment & Plan Note (Signed)
Deteriorated. >25 min spent with patient, at least half of which was spent on counseling insomnia.  The problem of recurrent insomnia is discussed. Avoidance of caffeine sources is strongly encouraged. Sleep hygiene issues are reviewed. The use of sedative hypnotics for temporary relief is appropriate but advised caution in her age group; we discussed the addictive nature of these drugs, and a one-time only prescription for prn use of a hypnotic is given, to use no more than 3 times per week for 2-3 weeks.

## 2014-03-13 NOTE — Assessment & Plan Note (Signed)
Deteriorated. Restart Nexium. eRx sent.

## 2014-03-13 NOTE — Patient Instructions (Signed)
Good to see you. We are starting Ambien 5 mg nightly as needed for sleep- try to use cautiously. Call me with an update.  Restart your Nexium.

## 2014-03-31 ENCOUNTER — Other Ambulatory Visit: Payer: Self-pay | Admitting: Family Medicine

## 2014-03-31 DIAGNOSIS — E039 Hypothyroidism, unspecified: Secondary | ICD-10-CM

## 2014-03-31 DIAGNOSIS — I10 Essential (primary) hypertension: Secondary | ICD-10-CM

## 2014-03-31 DIAGNOSIS — Z136 Encounter for screening for cardiovascular disorders: Secondary | ICD-10-CM

## 2014-03-31 DIAGNOSIS — Z0000 Encounter for general adult medical examination without abnormal findings: Secondary | ICD-10-CM

## 2014-04-05 ENCOUNTER — Other Ambulatory Visit (INDEPENDENT_AMBULATORY_CARE_PROVIDER_SITE_OTHER): Payer: Medicare Other

## 2014-04-05 DIAGNOSIS — Z136 Encounter for screening for cardiovascular disorders: Secondary | ICD-10-CM

## 2014-04-05 DIAGNOSIS — E039 Hypothyroidism, unspecified: Secondary | ICD-10-CM

## 2014-04-05 DIAGNOSIS — I10 Essential (primary) hypertension: Secondary | ICD-10-CM

## 2014-04-05 DIAGNOSIS — Z0000 Encounter for general adult medical examination without abnormal findings: Secondary | ICD-10-CM

## 2014-04-05 LAB — COMPREHENSIVE METABOLIC PANEL
ALK PHOS: 63 U/L (ref 39–117)
ALT: 21 U/L (ref 0–35)
AST: 17 U/L (ref 0–37)
Albumin: 4.3 g/dL (ref 3.5–5.2)
BUN: 15 mg/dL (ref 6–23)
CO2: 32 mEq/L (ref 19–32)
Calcium: 9.3 mg/dL (ref 8.4–10.5)
Chloride: 102 mEq/L (ref 96–112)
Creatinine, Ser: 0.9 mg/dL (ref 0.4–1.2)
GFR: 70.52 mL/min (ref >60.00)
Glucose, Bld: 90 mg/dL (ref 70–99)
Potassium: 3.5 mEq/L (ref 3.5–5.1)
SODIUM: 141 meq/L (ref 135–145)
TOTAL PROTEIN: 7.3 g/dL (ref 6.0–8.3)
Total Bilirubin: 0.6 mg/dL (ref 0.2–1.2)

## 2014-04-05 LAB — LIPID PANEL
CHOLESTEROL: 170 mg/dL (ref 0–200)
HDL: 49.9 mg/dL (ref >39.00)
LDL CALC: 102 mg/dL — AB (ref 0–99)
NonHDL: 120.1
Total CHOL/HDL Ratio: 3
Triglycerides: 91 mg/dL (ref 0.0–149.0)
VLDL: 18.2 mg/dL (ref 0.0–40.0)

## 2014-04-05 LAB — CBC WITH DIFFERENTIAL/PLATELET
Basophils Absolute: 0.1 10*3/uL (ref 0.0–0.1)
Basophils Relative: 0.7 % (ref 0.0–3.0)
Eosinophils Absolute: 0.3 10*3/uL (ref 0.0–0.7)
Eosinophils Relative: 3.9 % (ref 0.0–5.0)
HEMATOCRIT: 40.2 % (ref 36.0–46.0)
HEMOGLOBIN: 13.5 g/dL (ref 12.0–15.0)
LYMPHS ABS: 2 10*3/uL (ref 0.7–4.0)
Lymphocytes Relative: 25 % (ref 12.0–46.0)
MCHC: 33.5 g/dL (ref 30.0–36.0)
MCV: 89.7 fl (ref 78.0–100.0)
MONO ABS: 0.9 10*3/uL (ref 0.1–1.0)
Monocytes Relative: 10.5 % (ref 3.0–12.0)
Neutro Abs: 4.9 10*3/uL (ref 1.4–7.7)
Neutrophils Relative %: 59.9 % (ref 43.0–77.0)
PLATELETS: 321 10*3/uL (ref 150.0–400.0)
RBC: 4.48 Mil/uL (ref 3.87–5.11)
RDW: 13.4 % (ref 11.5–15.5)
WBC: 8.1 10*3/uL (ref 4.0–10.5)

## 2014-04-05 LAB — T4, FREE: FREE T4: 0.84 ng/dL (ref 0.60–1.60)

## 2014-04-05 LAB — TSH: TSH: 1.39 u[IU]/mL (ref 0.35–4.50)

## 2014-04-10 ENCOUNTER — Other Ambulatory Visit: Payer: Self-pay | Admitting: Family Medicine

## 2014-04-12 ENCOUNTER — Ambulatory Visit (INDEPENDENT_AMBULATORY_CARE_PROVIDER_SITE_OTHER): Payer: Medicare Other | Admitting: Family Medicine

## 2014-04-12 ENCOUNTER — Telehealth: Payer: Self-pay | Admitting: Family Medicine

## 2014-04-12 ENCOUNTER — Encounter: Payer: Self-pay | Admitting: Family Medicine

## 2014-04-12 VITALS — BP 122/76 | HR 53 | Temp 97.6°F | Ht 63.5 in | Wt 125.5 lb

## 2014-04-12 DIAGNOSIS — Z23 Encounter for immunization: Secondary | ICD-10-CM

## 2014-04-12 DIAGNOSIS — E039 Hypothyroidism, unspecified: Secondary | ICD-10-CM

## 2014-04-12 DIAGNOSIS — M899 Disorder of bone, unspecified: Secondary | ICD-10-CM

## 2014-04-12 DIAGNOSIS — M949 Disorder of cartilage, unspecified: Secondary | ICD-10-CM

## 2014-04-12 DIAGNOSIS — F3289 Other specified depressive episodes: Secondary | ICD-10-CM

## 2014-04-12 DIAGNOSIS — Z0000 Encounter for general adult medical examination without abnormal findings: Secondary | ICD-10-CM

## 2014-04-12 DIAGNOSIS — G4700 Insomnia, unspecified: Secondary | ICD-10-CM

## 2014-04-12 DIAGNOSIS — K589 Irritable bowel syndrome without diarrhea: Secondary | ICD-10-CM

## 2014-04-12 DIAGNOSIS — I10 Essential (primary) hypertension: Secondary | ICD-10-CM

## 2014-04-12 DIAGNOSIS — I4891 Unspecified atrial fibrillation: Secondary | ICD-10-CM

## 2014-04-12 DIAGNOSIS — F329 Major depressive disorder, single episode, unspecified: Secondary | ICD-10-CM

## 2014-04-12 DIAGNOSIS — H6980 Other specified disorders of Eustachian tube, unspecified ear: Secondary | ICD-10-CM

## 2014-04-12 DIAGNOSIS — H6990 Unspecified Eustachian tube disorder, unspecified ear: Secondary | ICD-10-CM | POA: Insufficient documentation

## 2014-04-12 MED ORDER — POTASSIUM CHLORIDE CRYS ER 10 MEQ PO TBCR: 10.0000 meq | EXTENDED_RELEASE_TABLET | Freq: Two times a day (BID) | ORAL | Status: DC

## 2014-04-12 MED ORDER — ESOMEPRAZOLE MAGNESIUM 40 MG PO CPDR: DELAYED_RELEASE_CAPSULE | ORAL | Status: DC

## 2014-04-12 NOTE — Patient Instructions (Signed)
It was great to see you. Good luck on your next trip.  We will call with your bone density appointment.

## 2014-04-12 NOTE — Assessment & Plan Note (Signed)
Deteriorated. Advised trying nasocort instead of flonase.

## 2014-04-12 NOTE — Assessment & Plan Note (Signed)
Well controlled on current rx. No changes. 

## 2014-04-12 NOTE — Assessment & Plan Note (Signed)
Stable on current dose of synthroid. No changes.

## 2014-04-12 NOTE — Progress Notes (Signed)
68 yo pleasant female here for annual medicare wellness visit.  I have personally reviewed the Medicare Annual Wellness questionnaire and have noted 1. The patient's medical and social history 2. Their use of alcohol, tobacco or illicit drugs 3. Their current medications and supplements 4. The patient's functional ability including ADL's, fall risks, home safety risks and hearing or visual             impairment. 5. Diet and physical activities 6. Evidence for depression or mood disorders  End of life wishes discussed and updated in Social History.  S/p hysterectomy Pneumovax 04/08/2012 Td 01/29/05 Zoster 04/16/07 Mammogram 03/13/14 DEXA- 05/03/12- osteopenia Colonoscopy - 02/23/04- Deatra Ina- 10 year recall. Has an a follow up appointment with Dr. Deatra Ina on 05/08/2014.  Last eye exam- Dr. Prentice Docker- 01/26/14 Dental exam (Dr. Toy Cookey)- 11/22/13  Very physically active- plays tennis daily.  ENT- saw Dr. Benjamine Mola for right ear issues.  Per pt,told pt she had mild hearing loss, cerumen impaction , and ETD.  Advised increasing frequency of flonase.  Has not helped yet.  Has follow up in July.  Afib- on Toprol and Xarelto.  Last saw Dr. Stanford Breed in 07/2013.    Hypothyroidism- denies any symptoms of hypo or hyperthyroidism. Lab Results  Component Value Date   TSH 1.39 04/05/2014   Lab Results  Component Value Date   CREATININE 0.9 04/05/2014   HLD- on Lipitor 20 mg daily.  Denies myalgias.  Lab Results  Component Value Date   CHOL 170 04/05/2014   HDL 49.90 04/05/2014   LDLCALC 102* 04/05/2014   LDLDIRECT 129.2 05/28/2007   TRIG 91.0 04/05/2014   CHOLHDL 3 04/05/2014   Constipation and feeling bloated improved since she started eating [place] yogurt. No blood in stool.  Align caused diarrhea.   UTD on all other prevention.  Patient Active Problem List   Diagnosis Date Noted  . Medicare annual wellness visit, subsequent 04/12/2014  . Chest pain 03/13/2014  . Cerumen impaction 11/23/2013  . Allergic  rhinitis 08/24/2013  . Cyst, dermoid, ear 07/12/2013  . IBS (irritable bowel syndrome) 04/11/2013  . GI symptoms 03/07/2013  . Atrial fibrillation 01/27/2013  . Cerebrovascular disease 01/27/2013  . TIA (transient ischemic attack) 12/14/2012  . Confusion 11/22/2012  . Dizziness 11/22/2012  . Insomnia 11/02/2012  . GERD 03/12/2010  . HYPOTHYROIDISM 04/14/2007  . DEPRESSION 04/14/2007  . HYPERTENSION 04/14/2007  . OSTEOPENIA 04/14/2007   Past Medical History  Diagnosis Date  . Allergy   . Depression   . Hypertension   . Osteoporosis   . Hypothyroidism   . Asthma   . GERD (gastroesophageal reflux disease)   . Hyperlipidemia   . TIA (transient ischemic attack)   . Arthritis     hands  . Atrial fibrillation   . Atrial flutter    Past Surgical History  Procedure Laterality Date  . Partial hysterectomy  1989  . Colonoscopy    . Tee without cardioversion N/A 12/22/2012    Procedure: TRANSESOPHAGEAL ECHOCARDIOGRAM (TEE);  Surgeon: Lelon Perla, MD;  Location: [facility] ENDOSCOPY;  Service: Cardiovascular;  Laterality: N/A;   History  Substance Use Topics  . Smoking status: Never Smoker   . Smokeless tobacco: Never Used  . Alcohol Use: 1.0 oz/week    2 drink(s) per week     Comment: Rare   Family History  Problem Relation Age of Onset  . Cancer Mother   . Drug abuse Son   . Depression Son   . Stroke Paternal  Aunt   . Heart disease Paternal Grandfather   . Diabetes Maternal Grandfather    No Known Allergies Current Outpatient Prescriptions on File Prior to Visit  Medication Sig Dispense Refill  . Alpha-D-Galactosidase (BEANO PO) Take by mouth 2 (two) times daily.      . Alum & Mag Hydroxide-Simeth (MAGIC MOUTHWASH W/LIDOCAINE) SOLN Take 10 mLs by mouth 4 (four) times daily as needed. Swish and spit out 10 ml by mouth 4 times a day  240 mL  0  . atorvastatin (LIPITOR) 20 MG tablet TAKE 1 TABLET BY MOUTH DAILY  30 tablet  0  . Bepotastine Besilate (BEPREVE) 1.5 % SOLN Use  one drop in each eye twice a day      . bumetanide (BUMEX) 1 MG tablet TAKE 1 TABLET (1 MG TOTAL) BY MOUTH 2 (TWO) TIMES DAILY.  180 tablet  0  . buPROPion (WELLBUTRIN) 75 MG tablet TAKE 1 TABLET BY MOUTH TWICE A DAY  180 tablet  0  . Calcium Carbonate-Vitamin D (CALCIUM 600/VITAMIN D) 600-400 MG-UNIT per tablet Take 1 tablet by mouth 2 (two) times daily.        . Cholecalciferol (VITAMIN D-3) 5000 UNITS TABS Take one tablet by mouth three times a week      . cyclobenzaprine (FLEXERIL) 10 MG tablet Take 10 mg by mouth 2 (two) times daily as needed. As needed for back pain       . diclofenac sodium (VOLTAREN) 1 % GEL Apply as needed  9 Tube  3  . famotidine (PEPCID AC) 10 MG chewable tablet Chew 10 mg by mouth daily.      . fexofenadine (ALLEGRA) 60 MG tablet Take 1 tablet (60 mg total) by mouth 2 (two) times daily.  60 tablet  3  . fluocinonide gel (LIDEX) 0.05 % Apply topically as needed.      . fluticasone (FLONASE) 50 MCG/ACT nasal spray Place 2 sprays into the nose daily.  48 g  1  . Fluticasone-Salmeterol (ADVAIR DISKUS) 100-50 MCG/DOSE AEPB Inhale 1 puff into the lungs 2 (two) times daily.  180 each  1  . HYDROcodone-acetaminophen (VICODIN) 5-500 MG per tablet Take 1 tablet by mouth every 6 (six) hours as needed.        Marland Kitchen levocetirizine (XYZAL) 5 MG tablet Take 1 tablet (5 mg total) by mouth every evening.  90 tablet  3  . levothyroxine (SYNTHROID, LEVOTHROID) 50 MCG tablet TAKE 1 TABLET BY MOUTH DAILY  30 tablet  1  . Melatonin 5 MG TABS Take 3 tablets my mouth at bedtime      . metoprolol succinate (TOPROL-XL) 25 MG 24 hr tablet TAKE 1 TABLET (25 MG TOTAL) BY MOUTH DAILY.  90 tablet  1  . NEXIUM 40 MG capsule TAKE 1 CAPSULE (40 MG TOTAL) BY MOUTH DAILY.  90 capsule  1  . nystatin (MYCOSTATIN) 100000 UNIT/ML suspension SWISH BY MOUTH 10 ML UP TO 4 TIMES A DAY  240 mL  1  . traZODone (DESYREL) 50 MG tablet TAKE 3 TABLETS AT BEDTIME.  270 tablet  0  . triamcinolone (NASACORT) 55 MCG/ACT AERO  nasal inhaler Place 2 sprays into the nose daily.  3 Inhaler  3  . XARELTO 20 MG TABS tablet TAKE 1 TABLET (20 MG TOTAL) BY MOUTH DAILY.  90 tablet  1   No current facility-administered medications on file prior to visit.    The PMH, PSH, Social History, Family History, Medications, and allergies have been  reviewed in [facility], and have been updated if relevant.  ROS: See HPI Patient reports no  vision/ hearing changes,anorexia, weight change, fever ,adenopathy, persistant / recurrent hoarseness, swallowing issues, chest pain, edema,persistant / recurrent cough, hemoptysis, dyspnea(rest, exertional, paroxysmal nocturnal), gastrointestinal  bleeding (melena, rectal bleeding), abdominal pain, excessive heart burn, GU symptoms(dysuria, hematuria, pyuria, voiding/incontinence  Issues) syncope, focal weakness, severe memory loss, concerning skin lesions, depression, anxiety, abnormal bruising/bleeding, major joint swelling, breast masses or abnormal vaginal bleeding.     Physical exam: BP 122/76  Pulse 53  Temp(Src) 97.6 F (36.4 C) (Oral)  Ht 5' 3.5" (1.613 m)  Wt 125 lb 8 oz (56.926 kg)  BMI 21.88 kg/m2  SpO2 97%   General:  Well-developed,well-nourished,in no acute distress; alert,appropriate and cooperative throughout examination Head:  normocephalic and atraumatic.   Eyes:  vision grossly intact, pupils equal, pupils round, and pupils reactive to light.   Ears:  R ear normal and L ear normal.   Nose:  no external deformity.   Mouth:  good dentition.   Neck:  No deformities, masses, or tenderness noted. Lungs:  Normal respiratory effort, chest expands symmetrically. Lungs are clear to auscultation, no crackles or wheezes. Heart:  Normal rate and regular rhythm. S1 and S2 normal without gallop, murmur, click, rub or other extra sounds. Abdomen:  Bowel sounds positive,abdomen soft and non-tender without masses, organomegaly or hernias noted. Msk:  No deformity or scoliosis noted of thoracic  or lumbar spine.   Extremities:  No clubbing, cyanosis, edema, or deformity noted with normal full range of motion of all joints.   Neurologic:  alert & oriented X3 and gait normal.   Skin:  Intact without suspicious lesions or rashes Cervical Nodes:  No lymphadenopathy noted Psych:  Cognition and judgment appear intact. Alert and cooperative with normal attention span and concentration. No apparent delusions, illusions, hallucination

## 2014-04-12 NOTE — Assessment & Plan Note (Signed)
Stable on current dose of Wellbutrin and Trazodone. No changes.

## 2014-04-12 NOTE — Telephone Encounter (Signed)
Relevant patient education assigned to patient using Emmi. ° °

## 2014-04-12 NOTE — Assessment & Plan Note (Signed)
Ambien caused her to bite her lip. Restarted Trazodone and doing better.

## 2014-04-12 NOTE — Assessment & Plan Note (Signed)
DEXA ordered.  

## 2014-04-12 NOTE — Progress Notes (Signed)
Pre visit review using our clinic review tool, if applicable. No additional management support is needed unless otherwise documented below in the visit note. 

## 2014-04-12 NOTE — Assessment & Plan Note (Addendum)
The patients weight, height, BMI and visual acuity have been recorded in the chart I have made referrals, counseling and provided education to the patient based review of the above and I have provided the pt with a written personalized care plan for preventive services.  Prevnar 13 today.

## 2014-04-12 NOTE — Assessment & Plan Note (Signed)
Improved with [place] Yogurt.

## 2014-04-12 NOTE — Addendum Note (Signed)
Addended by: Modena Nunnery on: 04/12/2014 10:15 AM   Modules accepted: Orders

## 2014-04-12 NOTE — Assessment & Plan Note (Addendum)
Rate and rhythm controlled.  On Xarelto, metoprolol. Followed by Dr. Stanford Breed. Has follow up scheduled for 07/2014.

## 2014-04-19 ENCOUNTER — Other Ambulatory Visit: Payer: Self-pay | Admitting: Family Medicine

## 2014-04-24 ENCOUNTER — Ambulatory Visit (INDEPENDENT_AMBULATORY_CARE_PROVIDER_SITE_OTHER)
Admission: RE | Admit: 2014-04-24 | Discharge: 2014-04-24 | Disposition: A | Discharge status: Home / Self Care | Payer: Medicare Other | Source: Ambulatory Visit | Attending: Family Medicine | Admitting: Family Medicine

## 2014-04-24 ENCOUNTER — Telehealth: Payer: Self-pay

## 2014-04-24 DIAGNOSIS — M949 Disorder of cartilage, unspecified: Principal | ICD-10-CM

## 2014-04-24 DIAGNOSIS — M899 Disorder of bone, unspecified: Secondary | ICD-10-CM

## 2014-04-24 NOTE — Telephone Encounter (Signed)
Pt request # to possibly change bone density appt; gave pt [phone].

## 2014-04-25 ENCOUNTER — Inpatient Hospital Stay: Admission: RE | Admit: 2014-04-25 | Payer: Medicare Other | Source: Ambulatory Visit

## 2014-05-02 ENCOUNTER — Encounter: Payer: Self-pay | Admitting: Family Medicine

## 2014-05-02 ENCOUNTER — Ambulatory Visit (INDEPENDENT_AMBULATORY_CARE_PROVIDER_SITE_OTHER): Payer: Medicare Other | Admitting: Family Medicine

## 2014-05-02 VITALS — BP 116/70 | HR 56 | Temp 97.7°F | Wt 126.2 lb

## 2014-05-02 DIAGNOSIS — M810 Age-related osteoporosis without current pathological fracture: Secondary | ICD-10-CM

## 2014-05-02 MED ORDER — ALENDRONATE SODIUM 70 MG PO TABS: 70.0000 mg | ORAL_TABLET | ORAL | Status: DC

## 2014-05-02 NOTE — Assessment & Plan Note (Signed)
>  25 minutes spent in face to face time with patient, >50% spent in counselling or coordination of care Likely will not tolerate oral bisphosphonate due to gerd but she is willing to try. eRx for fosamax. Discussed prolia as well as we will likely need to transition to this. Given handout on calcium rich foods- see AVS.

## 2014-05-02 NOTE — Progress Notes (Signed)
Subjective:   Patient ID: Patricia Peck, female    DOB: [birth year], 68 y.o.   MRN: [id number]  Patricia Peck is a pleasant 68 y.o. year old female who presents to clinic today with Follow-up  on 05/02/2014  HPI:  Here with husband to discuss bone density results.  Dg Bone Density  04/27/2014   Date of study: 04/24/2014 Exam: DUAL X-RAY ABSORPTIOMETRY (DXA) FOR BONE MINERAL DENSITY (BMD) Instrument: [employer] Requesting Provider: Dr Deborra Medina Indication: follow up for low BMD Comparison: 04/13/2012 Clinical data: Pt is a postmenopausal 68 y.o. female without previous h/o  fracture. On calcium and vitamin D.  Results:  Lumbar spine (L1-L4) Femoral neck (FN) 33% distal radius  T-score -1.0 RFN: -1.4 LFN: -2.0 n/a  BMD (g/cm2) 1.066 RFN: 0.839 LFN: 0.764 n/a  Change in BMD from previous DXA test (*) +0.9 Total mean FN: -4.8%* n/a  (*) statistically significant  Assessment: the BMD is low according to the [facility] classification for  osteoporosis (see below). Fracture risk: moderate FRAX score: 10 year major osteoporotic risk: 10.6%. 10 year hip fracture  risk: 2.0%. These are under the thresholds for treatment of 20% and 3%,  respectively. Comments: the technical quality of the study is good, but the spine is  scoliotic Evaluation for secondary causes should be considered if clinically  indicated.  Recommend optimizing calcium (1200 mg/day) and vitamin D (800 IU/day)  intake. Followup: Repeat BMD is appropriate after 2 years.  WHO criteria for diagnosis of osteoporosis in postmenopausal women and in  men 75 y/o or older:  - normal: T-score -1.0 to + 1.0 - osteopenia/low bone density: T-score between -2.5 and -1.0 - osteoporosis: T-score below -2.5 - severe osteoporosis: T-score below -2.5 with history of fragility  fracture Note: although not part of the WHO classification, the presence of a  fragility fracture, regardless of the T-score, should be considered  diagnostic of osteoporosis, provided other  causes for the fracture have  been excluded.  Treatment: The National Osteoporosis Foundation recommends that treatment  be considered in postmenopausal women and men age 35 or older with: 1. Hip or vertebral (clinical or morphometric) fracture 2. T-score of - 2.5 or lower at the spine or hip 3. 10-year fracture probability by FRAX of at least 20% for a major  osteoporotic fracture and 3% for a hip fracture  Patricia Kingdom, MD [facility]      Very physically active and eats greek yogurt twice daily. Has never taken bisphosphonate due to severity of her GERD. Current Outpatient Prescriptions on File Prior to Visit  Medication Sig Dispense Refill  . Alpha-D-Galactosidase (BEANO PO) Take by mouth 2 (two) times daily.      . Alum & Mag Hydroxide-Simeth (MAGIC MOUTHWASH W/LIDOCAINE) SOLN Take 10 mLs by mouth 4 (four) times daily as needed. Swish and spit out 10 ml by mouth 4 times a day  240 mL  0  . atorvastatin (LIPITOR) 20 MG tablet TAKE 1 TABLET BY MOUTH DAILY  90 tablet  0  . Bepotastine Besilate (BEPREVE) 1.5 % SOLN Use one drop in each eye twice a day      . bumetanide (BUMEX) 1 MG tablet TAKE 1 TABLET (1 MG TOTAL) BY MOUTH 2 (TWO) TIMES DAILY.  180 tablet  0  . buPROPion (WELLBUTRIN) 75 MG tablet TAKE 1 TABLET BY MOUTH TWICE A DAY  180 tablet  0  . Calcium Carbonate-Vitamin D (CALCIUM 600/VITAMIN D) 600-400 MG-UNIT per tablet Take 1  tablet by mouth 2 (two) times daily.        . Cholecalciferol (VITAMIN D-3) 5000 UNITS TABS Take one tablet by mouth three times a week      . cyclobenzaprine (FLEXERIL) 10 MG tablet Take 10 mg by mouth 2 (two) times daily as needed. As needed for back pain       . diclofenac sodium (VOLTAREN) 1 % GEL Apply as needed  9 Tube  3  . famotidine (PEPCID AC) 10 MG chewable tablet Chew 10 mg by mouth daily.      . fexofenadine (ALLEGRA) 60 MG tablet Take 1 tablet (60 mg total) by mouth 2 (two) times daily.  60 tablet  3  . fluocinonide gel (LIDEX) 0.05 % Apply  topically as needed.      . fluticasone (FLONASE) 50 MCG/ACT nasal spray Place 2 sprays into the nose daily.  48 g  1  . Fluticasone-Salmeterol (ADVAIR DISKUS) 100-50 MCG/DOSE AEPB Inhale 1 puff into the lungs 2 (two) times daily.  180 each  1  . HYDROcodone-acetaminophen (VICODIN) 5-500 MG per tablet Take 1 tablet by mouth every 6 (six) hours as needed.        Marland Kitchen levocetirizine (XYZAL) 5 MG tablet Take 1 tablet (5 mg total) by mouth every evening.  90 tablet  3  . levothyroxine (SYNTHROID, LEVOTHROID) 50 MCG tablet TAKE 1 TABLET BY MOUTH DAILY  30 tablet  1  . Melatonin 5 MG TABS Take 3 tablets my mouth at bedtime      . metoprolol succinate (TOPROL-XL) 25 MG 24 hr tablet TAKE 1 TABLET (25 MG TOTAL) BY MOUTH DAILY.  90 tablet  1  . NEXIUM 40 MG capsule TAKE 1 CAPSULE (40 MG TOTAL) BY MOUTH DAILY.  90 capsule  1  . nystatin (MYCOSTATIN) 100000 UNIT/ML suspension SWISH BY MOUTH 10 ML UP TO 4 TIMES A DAY  240 mL  1  . potassium chloride (K-DUR,KLOR-CON) 10 MEQ tablet Take 1 tablet (10 mEq total) by mouth 2 (two) times daily.  180 tablet  0  . traZODone (DESYREL) 50 MG tablet TAKE 3 TABLETS AT BEDTIME.  270 tablet  0  . XARELTO 20 MG TABS tablet TAKE 1 TABLET (20 MG TOTAL) BY MOUTH DAILY.  90 tablet  1   No current facility-administered medications on file prior to visit.    No Known Allergies  Past Medical History  Diagnosis Date  . Allergy   . Depression   . Hypertension   . Osteoporosis   . Hypothyroidism   . Asthma   . GERD (gastroesophageal reflux disease)   . Hyperlipidemia   . TIA (transient ischemic attack)   . Arthritis     hands  . Atrial fibrillation   . Atrial flutter     Past Surgical History  Procedure Laterality Date  . Partial hysterectomy  1989  . Colonoscopy    . Tee without cardioversion N/A 12/22/2012    Procedure: TRANSESOPHAGEAL ECHOCARDIOGRAM (TEE);  Surgeon: Lelon Perla, MD;  Location: [facility] ENDOSCOPY;  Service: Cardiovascular;  Laterality: N/A;     Family History  Problem Relation Age of Onset  . Cancer Mother   . Drug abuse Son   . Depression Son   . Stroke Paternal Aunt   . Heart disease Paternal Grandfather   . Diabetes Maternal Grandfather     History   Social History  . Marital Status: Married    Spouse Name: N/A    Number of Children:  2  . Years of Education: N/A   Occupational History  .     Social History Main Topics  . Smoking status: Never Smoker   . Smokeless tobacco: Never Used  . Alcohol Use: 1.0 oz/week    2 drink(s) per week     Comment: Rare  . Drug Use: No  . Sexual Activity: Not on file   Other Topics Concern  . Not on file   Social History Narrative   Does have HPOA- sons Eddie Dibbles and Wille Glaser.   Does desire life support if not futile.   Not sure about feeding tubes.   The PMH, PSH, Social History, Family History, Medications, and allergies have been reviewed in [facility] and have been updated if relevant.  Review of Systems    See HPI Objective:    BP 116/70  Pulse 56  Temp(Src) 97.7 F (36.5 C) (Oral)  Wt 126 lb 4 oz (57.267 kg)  SpO2 96%   Physical Exam  Gen: alert, pleasant, NAD Psych:  Good eye contact, not anxious or depressed appearing      Assessment & Plan:   Osteoporosis No Follow-up on file.

## 2014-05-02 NOTE — Progress Notes (Signed)
Pre visit review using our clinic review tool, if applicable. No additional management support is needed unless otherwise documented below in the visit note. 

## 2014-05-02 NOTE — Patient Instructions (Signed)
Calcium supplements have received some bad press lately, with questions that they may increase risk of heart attack or blood clots.  The risk is very low, however none of these risks occur with calcium in FOOD. Try to get most or all of your calcium from your food--aim for 1000 mg/day for women up to 36 and men up to 70 and 1200 mg/day for women over 23 and men over 70.  To figure out dietary calcium: 300 mg/day from all non dairy foods plus 300 mg per cup of milk, other dairy, or fortified juice.  Non dairy foods that contain calcium:  Kale, oranges, sardines, oatmeal, soy milk/soybeans, salmon, white beans, dried figs, turnip greens, almonds, broccoli, tofu.  We will call you when we receive your prolia.

## 2014-05-03 ENCOUNTER — Telehealth: Payer: Self-pay | Admitting: *Deleted

## 2014-05-03 NOTE — Telephone Encounter (Signed)
Information faxed to prolia for approval of injection for osteoporosis

## 2014-05-08 ENCOUNTER — Ambulatory Visit: Payer: Medicare Other | Admitting: Gastroenterology

## 2014-05-08 ENCOUNTER — Telehealth: Payer: Self-pay | Admitting: *Deleted

## 2014-05-08 ENCOUNTER — Ambulatory Visit (INDEPENDENT_AMBULATORY_CARE_PROVIDER_SITE_OTHER): Payer: Medicare Other | Admitting: Gastroenterology

## 2014-05-08 ENCOUNTER — Encounter: Payer: Self-pay | Admitting: Gastroenterology

## 2014-05-08 VITALS — BP 140/80 | HR 60 | Ht 63.5 in | Wt 127.2 lb

## 2014-05-08 DIAGNOSIS — Z1211 Encounter for screening for malignant neoplasm of colon: Secondary | ICD-10-CM | POA: Insufficient documentation

## 2014-05-08 MED ORDER — NA SULFATE-K SULFATE-MG SULF 17.5-3.13-1.6 GM/177ML PO SOLN: 1.0000 | Freq: Once | ORAL | Status: DC

## 2014-05-08 NOTE — Telephone Encounter (Signed)
  05/08/2014   RE: AVRYL ROEHM DOB: 25-Aug-1946 MRN: [id number]   Dear  Dr Stanford Breed    We have scheduled the above patient for an endoscopic procedure. Our records show that she is on anticoagulation therapy.   Please advise as to how long the patient may come off her therapy of Xarelto prior to the procedure, which is scheduled for 9/17.  Please fax back/ or route the completed form to [facility] at [phone].   Sincerely,    Genella Mech

## 2014-05-08 NOTE — Telephone Encounter (Signed)
DC xarelto 2 days prior to procedure and resume day after Patricia Peck  

## 2014-05-08 NOTE — Patient Instructions (Signed)
You have been scheduled for a colonoscopy. Please follow written instructions given to you at your visit today.  Please pick up your prep kit at the pharmacy within the next 1-3 days. If you use inhalers (even only as needed), please bring them with you on the day of your procedure. Your physician has requested that you go to www.startemmi.com and enter the access code given to you at your visit today. This web site gives a general overview about your procedure. However, you should still follow specific instructions given to you by our office regarding your preparation for the procedure.  You will be contaced by our office prior to your procedure for directions on holding your Xarelto.  If you do not hear from our office 1 week prior to your scheduled procedure, please call [phone] to discuss.

## 2014-05-08 NOTE — Assessment & Plan Note (Signed)
Plan to schedule colonoscopy.  I will check with the patient's cardiologist whether xarelto can be held.  The risk of holding anticoagulation therapy or antiplatelet medications was discussed including the increased risk for thromboembolic disease that may include DVT, pulmonary emboli and stroke. The patient understands this risk and is willing to proceed with temporally holding the medication provided that this is approved by her PCP or cardiologist.

## 2014-05-08 NOTE — Telephone Encounter (Signed)
SPOKE WITH PT \  PT AWARE TO HOLD XARELTO  2 DAYS PRIOR

## 2014-05-08 NOTE — Progress Notes (Signed)
_                                                                                                                History of Present Illness: Patricia Peck 68 year old white female referred for screening colonoscopy.  Last exam 10 years ago was unremarkable.  Patient is on  xarelto for atrial fibrillation.  She has no GI complaints.    Past Medical History  Diagnosis Date  . Allergy   . Depression   . Hypertension   . Osteoporosis   . Hypothyroidism   . Asthma   . GERD (gastroesophageal reflux disease)   . Hyperlipidemia   . TIA (transient ischemic attack)   . Arthritis     hands  . Atrial fibrillation   . Atrial flutter    Past Surgical History  Procedure Laterality Date  . Partial hysterectomy  1989  . Colonoscopy    . Tee without cardioversion N/A 12/22/2012    Procedure: TRANSESOPHAGEAL ECHOCARDIOGRAM (TEE);  Surgeon: Lelon Perla, MD;  Location: [facility] ENDOSCOPY;  Service: Cardiovascular;  Laterality: N/A;   family history includes Breast cancer in her mother; Depression in her son; Diabetes in her maternal grandfather; Drug abuse in her son; Heart disease in her paternal grandfather; Stroke in her paternal aunt. Current Outpatient Prescriptions  Medication Sig Dispense Refill  . alendronate (FOSAMAX) 70 MG tablet Take 1 tablet (70 mg total) by mouth every 7 (seven) days. Take with a full glass of water on an empty stomach.  4 tablet  11  . Alpha-D-Galactosidase (BEANO PO) Take by mouth 2 (two) times daily.      . Alum & Mag Hydroxide-Simeth (MAGIC MOUTHWASH W/LIDOCAINE) SOLN Take 10 mLs by mouth 4 (four) times daily as needed. Swish and spit out 10 ml by mouth 4 times a day  240 mL  0  . atorvastatin (LIPITOR) 20 MG tablet TAKE 1 TABLET BY MOUTH DAILY  90 tablet  0  . Bepotastine Besilate (BEPREVE) 1.5 % SOLN Use one drop in each eye twice a day      . bumetanide (BUMEX) 1 MG tablet TAKE 1 TABLET (1 MG TOTAL) BY MOUTH 2 (TWO) TIMES DAILY.  180 tablet  0  .  buPROPion (WELLBUTRIN) 75 MG tablet TAKE 1 TABLET BY MOUTH TWICE A DAY  180 tablet  0  . Calcium Carbonate-Vitamin D (CALCIUM 600/VITAMIN D) 600-400 MG-UNIT per tablet Take 1 tablet by mouth 2 (two) times daily.        . Cholecalciferol (VITAMIN D-3) 5000 UNITS TABS Take one tablet by mouth three times a week      . cyclobenzaprine (FLEXERIL) 10 MG tablet Take 10 mg by mouth 2 (two) times daily as needed. As needed for back pain       . diclofenac sodium (VOLTAREN) 1 % GEL Apply as needed  9 Tube  3  . famotidine (PEPCID AC) 10 MG chewable tablet Chew 10 mg by mouth daily.      . fexofenadine (ALLEGRA) 60  MG tablet Take 1 tablet (60 mg total) by mouth 2 (two) times daily.  60 tablet  3  . fluocinonide gel (LIDEX) 0.05 % Apply topically as needed.      . fluticasone (FLONASE) 50 MCG/ACT nasal spray Place 2 sprays into the nose daily.  48 g  1  . Fluticasone-Salmeterol (ADVAIR DISKUS) 100-50 MCG/DOSE AEPB Inhale 1 puff into the lungs 2 (two) times daily.  180 each  1  . HYDROcodone-acetaminophen (VICODIN) 5-500 MG per tablet Take 1 tablet by mouth every 6 (six) hours as needed.        Marland Kitchen levocetirizine (XYZAL) 5 MG tablet Take 1 tablet (5 mg total) by mouth every evening.  90 tablet  3  . levothyroxine (SYNTHROID, LEVOTHROID) 50 MCG tablet TAKE 1 TABLET BY MOUTH DAILY  30 tablet  1  . Melatonin 5 MG TABS Take 3 tablets my mouth at bedtime      . metoprolol succinate (TOPROL-XL) 25 MG 24 hr tablet TAKE 1 TABLET (25 MG TOTAL) BY MOUTH DAILY.  90 tablet  1  . NEXIUM 40 MG capsule TAKE 1 CAPSULE (40 MG TOTAL) BY MOUTH DAILY.  90 capsule  1  . nystatin (MYCOSTATIN) 100000 UNIT/ML suspension SWISH BY MOUTH 10 ML UP TO 4 TIMES A DAY  240 mL  1  . potassium chloride (K-DUR,KLOR-CON) 10 MEQ tablet Take 1 tablet (10 mEq total) by mouth 2 (two) times daily.  180 tablet  0  . traZODone (DESYREL) 50 MG tablet TAKE 3 TABLETS AT BEDTIME.  270 tablet  0  . XARELTO 20 MG TABS tablet TAKE 1 TABLET (20 MG TOTAL) BY  MOUTH DAILY.  90 tablet  1   No current facility-administered medications for this visit.   Allergies as of 05/08/2014  . (No Known Allergies)    reports that she has never smoked. She has never used smokeless tobacco. She reports that she drinks alcohol. She reports that she does not use illicit drugs.     Review of Systems: Pertinent positive and negative review of systems were noted in the above HPI section. All other review of systems were otherwise negative.  Vital signs were reviewed in today's medical record Physical Exam: General: Well developed , well nourished, no acute distress Skin: anicteric Head: Normocephalic and atraumatic Eyes:  sclerae anicteric, EOMI Ears: Normal auditory acuity Mouth: No deformity or lesions Neck: Supple, no masses or thyromegaly Lungs: Clear throughout to auscultation Heart: Regular rate and rhythm; no murmurs, rubs or bruits Abdomen: Soft, non tender and non distended. No masses, hepatosplenomegaly or hernias noted. Normal Bowel sounds Rectal:deferred Musculoskeletal: Symmetrical with no gross deformities  Skin: No lesions on visible extremities Pulses:  Normal pulses noted Extremities: No clubbing, cyanosis, edema or deformities noted Neurological: Alert oriented x 4, grossly nonfocal Cervical Nodes:  No significant cervical adenopathy Inguinal Nodes: No significant inguinal adenopathy Psychological:  Alert and cooperative. Normal mood and affect  See Assessment and Plan under Problem List

## 2014-05-10 ENCOUNTER — Encounter: Payer: Self-pay | Admitting: Gastroenterology

## 2014-05-13 ENCOUNTER — Other Ambulatory Visit: Payer: Self-pay | Admitting: Family Medicine

## 2014-05-25 ENCOUNTER — Telehealth: Payer: Self-pay | Admitting: *Deleted

## 2014-05-25 NOTE — Telephone Encounter (Signed)
Spoke to pt and informed her paperwork has been received and approved for Prolia injection. Per Rose, based on information received,pts estimate is $0. Pt advised this is ONLY an estimate and is subject to change. Injection scheduled and ordered; paperwork sent for scanning

## 2014-06-09 ENCOUNTER — Telehealth: Payer: Self-pay

## 2014-06-09 ENCOUNTER — Ambulatory Visit (INDEPENDENT_AMBULATORY_CARE_PROVIDER_SITE_OTHER): Payer: Medicare Other

## 2014-06-09 DIAGNOSIS — M810 Age-related osteoporosis without current pathological fracture: Secondary | ICD-10-CM

## 2014-06-09 MED ORDER — DENOSUMAB 60 MG/ML ~~LOC~~ SOLN
60.0000 mg | Freq: Once | SUBCUTANEOUS | Status: AC
  Administered 2014-06-09: 60 mg via SUBCUTANEOUS

## 2014-06-09 NOTE — Telephone Encounter (Signed)
Patricia Peck received 1st Prolia injection today and pt requested cb; pt was not sure if she should continue taking the calcium with Vit D. Presently pt taking Calcium with Vit D 600 mg twice a day.

## 2014-06-09 NOTE — Telephone Encounter (Signed)
Yes ok to continue taking calcium with vit d

## 2014-06-09 NOTE — Telephone Encounter (Signed)
Spoke to pt and advised per Dr Aron; pt verbally expressed understanding.  

## 2014-07-05 ENCOUNTER — Telehealth: Payer: Self-pay | Admitting: Family Medicine

## 2014-07-05 NOTE — Telephone Encounter (Signed)
Pt left vm saying that she has been having sinus congestion in her head and ears.  She saw Dr. Deborra Medina recently and was referred to ENT for this, who told her she may need to have eustachian tubes placed in her ears.  She would like to know if she needs to come in and see Dr. Deborra Medina or if she should go back to the ENT.

## 2014-07-05 NOTE — Telephone Encounter (Signed)
I would recommend that she see ENT.

## 2014-07-05 NOTE — Telephone Encounter (Signed)
Lm on pts vm requesting a call back 

## 2014-07-06 NOTE — Telephone Encounter (Signed)
Spoke to pt and advised per Dr Deborra Medina; pt verbally expressed understanding and states that she is currently seeing them

## 2014-07-17 ENCOUNTER — Other Ambulatory Visit: Payer: Self-pay | Admitting: Family Medicine

## 2014-07-17 ENCOUNTER — Other Ambulatory Visit: Payer: Self-pay | Admitting: Cardiology

## 2014-07-18 ENCOUNTER — Other Ambulatory Visit: Payer: Self-pay | Admitting: *Deleted

## 2014-07-18 ENCOUNTER — Other Ambulatory Visit: Payer: Self-pay | Admitting: Cardiology

## 2014-07-18 MED ORDER — ATORVASTATIN CALCIUM 20 MG PO TABS: ORAL_TABLET | ORAL | Status: DC

## 2014-07-19 ENCOUNTER — Telehealth: Payer: Self-pay | Admitting: Cardiology

## 2014-07-19 NOTE — Telephone Encounter (Signed)
We did not fill this med [employer] did

## 2014-07-19 NOTE — Telephone Encounter (Signed)
Spoke with patient told her she need to keep appointment with Dr Stanford Breed on 9/21 in order for her to get a 90 day refills on her metoprolol because she have not been seen in over a year, pt verbally understand.

## 2014-07-19 NOTE — Telephone Encounter (Signed)
Pt was concerned that she only received #30 of her Metoprolol,she usually gets #90. Please call her.

## 2014-07-20 ENCOUNTER — Ambulatory Visit (AMBULATORY_SURGERY_CENTER): Payer: Medicare Other | Admitting: Gastroenterology

## 2014-07-20 ENCOUNTER — Encounter: Payer: Self-pay | Admitting: Gastroenterology

## 2014-07-20 VITALS — BP 115/80 | HR 55 | Temp 97.4°F | Resp 19 | Ht 63.5 in | Wt 127.0 lb

## 2014-07-20 DIAGNOSIS — Z1211 Encounter for screening for malignant neoplasm of colon: Secondary | ICD-10-CM

## 2014-07-20 DIAGNOSIS — K5730 Diverticulosis of large intestine without perforation or abscess without bleeding: Secondary | ICD-10-CM

## 2014-07-20 MED ORDER — SODIUM CHLORIDE 0.9 % IV SOLN: 500.0000 mL | INTRAVENOUS | Status: DC

## 2014-07-20 NOTE — Progress Notes (Signed)
No problems noted in the recovery room. maw 

## 2014-07-20 NOTE — Op Note (Addendum)
[place]  [address] [place], [id number]   COLONOSCOPY PROCEDURE REPORT  PATIENT: Patricia Peck, Patricia Peck  MR#: [id number] BIRTHDATE: 09-09-1946 , 60  yrs. old GENDER: Female ENDOSCOPIST: Inda Castle, MD REFERRED GB:TDVVO Aron, M.D. PROCEDURE DATE:  07/20/2014 PROCEDURE:   Colonoscopy, diagnostic First Screening Colonoscopy - Avg.  risk and is 50 yrs.  old or older - No.  Prior Negative Screening - Now for repeat screening. 10 or more years since last screening  History of Adenoma - Now for follow-up colonoscopy & has been > or = to 3 yrs.  N/A  Polyps Removed Today? No.  Recommend repeat exam, <10 yrs? No. ASA CLASS:   Class II INDICATIONS:Average risk patient for colon cancer. MEDICATIONS: MAC sedation, administered by CRNA and propofol (Diprivan) 200mg  IV  DESCRIPTION OF PROCEDURE:   After the risks benefits and alternatives of the procedure were thoroughly explained, informed consent was obtained.  A digital rectal exam revealed no abnormalities of the rectum.   The [id number]  endoscope was introduced through the anus and advanced to the cecum, which was identified by both the appendix and ileocecal valve. No adverse events experienced.   The quality of the prep was Suprep good  The instrument was then slowly withdrawn as the colon was fully examined.      COLON FINDINGS: Mild diverticulosis was noted in the sigmoid colon. The colon was otherwise normal.  There was no diverticulosis, inflammation, polyps or cancers unless previously stated. Retroflexed views revealed no abnormalities. The time to cecum=5 minutes 05 seconds.  Withdrawal time=6 minutes 26 seconds.  The scope was withdrawn and the procedure completed. COMPLICATIONS: There were no complications.  ENDOSCOPIC IMPRESSION: 1.   Mild diverticulosis was noted in the sigmoid colon 2.   The colon was otherwise normal  RECOMMENDATIONS: Continue current colorectal screening  recommendations for "routine risk" patients with a repeat colonoscopy in 10 years.  Since we generally stop routine colorectal cancer screening at age 58-80, I will leave it to your primary care physician to decide whether or not to repeat this exam at that time.   eSigned:  Inda Castle, MD 07/20/2014 9:18 AM Revised: 07/20/2014 9:18 AM  cc:

## 2014-07-20 NOTE — Progress Notes (Signed)
Report to PACU, RN, vss, BBS= Clear.  

## 2014-07-20 NOTE — Patient Instructions (Addendum)
Restart xarelto today per Dr. Deatra Ina. Handouts were given to your care partner on diverticulosis and a high fiber diet with liberal fluid intake. You may resume your current medications today.  Per Dr. Deatra Ina add daily over the counter fiber supplement. Please call if any questions or concerns.      YOU HAD AN ENDOSCOPIC PROCEDURE TODAY AT [facility]: Refer to the procedure report that was given to you for any specific questions about what was found during the examination.  If the procedure report does not answer your questions, please call your gastroenterologist to clarify.  If you requested that your care partner not be given the details of your procedure findings, then the procedure report has been included in a sealed envelope for you to review at your convenience later.  YOU SHOULD EXPECT: Some feelings of bloating in the abdomen. Passage of more gas than usual.  Walking can help get rid of the air that was put into your GI tract during the procedure and reduce the bloating. If you had a lower endoscopy (such as a colonoscopy or flexible sigmoidoscopy) you may notice spotting of blood in your stool or on the toilet paper. If you underwent a bowel prep for your procedure, then you may not have a normal bowel movement for a few days.  DIET: Your first meal following the procedure should be a light meal and then it is ok to progress to your normal diet.  A half-sandwich or bowl of soup is an example of a good first meal.  Heavy or fried foods are harder to digest and may make you feel nauseous or bloated.  Likewise meals heavy in dairy and vegetables can cause extra gas to form and this can also increase the bloating.  Drink plenty of fluids but you should avoid alcoholic beverages for 24 hours.  ACTIVITY: Your care partner should take you home directly after the procedure.  You should plan to take it easy, moving slowly for the rest of the day.  You can resume normal activity the  day after the procedure however you should NOT DRIVE or use heavy machinery for 24 hours (because of the sedation medicines used during the test).    SYMPTOMS TO REPORT IMMEDIATELY: A gastroenterologist can be reached at any hour.  During normal business hours, 8:30 AM to 5:00 PM Monday through Friday, call [phone].  After hours and on weekends, please call the GI answering service at [phone] who will take a message and have the physician on call contact you.   Following lower endoscopy (colonoscopy or flexible sigmoidoscopy):  Excessive amounts of blood in the stool  Significant tenderness or worsening of abdominal pains  Swelling of the abdomen that is new, acute  Fever of 100F or higher FOLLOW UP: If any biopsies were taken you will be contacted by phone or by letter within the next 1-3 weeks.  Call your gastroenterologist if you have not heard about the biopsies in 3 weeks.  Our staff will call the home number listed on your records the next business day following your procedure to check on you and address any questions or concerns that you may have at that time regarding the information given to you following your procedure. This is a courtesy call and so if there is no answer at the home number and we have not heard from you through the emergency physician on call, we will assume that you have returned to your regular daily  activities without incident.  SIGNATURES/CONFIDENTIALITY: You and/or your care partner have signed paperwork which will be entered into your electronic medical record.  These signatures attest to the fact that that the information above on your After Visit Summary has been reviewed and is understood.  Full responsibility of the confidentiality of this discharge information lies with you and/or your care-partner.

## 2014-07-21 ENCOUNTER — Telehealth: Payer: Self-pay | Admitting: *Deleted

## 2014-07-21 NOTE — Telephone Encounter (Signed)
  Follow up Call-  Call back number 07/20/2014  Post procedure Call Back phone  # [phone]  Permission to leave phone message Yes     Patient questions:  Do you have a fever, pain , or abdominal swelling? No. Pain Score  0 *  Have you tolerated food without any problems? Yes.    Have you been able to return to your normal activities? Yes.    Do you have any questions about your discharge instructions: Diet   No. Medications  No. Follow up visit  No.  Do you have questions or concerns about your Care? No.  Actions: * If pain score is 4 or above: No action needed, pain <4.

## 2014-07-24 ENCOUNTER — Encounter: Payer: Self-pay | Admitting: Cardiology

## 2014-07-24 ENCOUNTER — Ambulatory Visit (INDEPENDENT_AMBULATORY_CARE_PROVIDER_SITE_OTHER): Payer: Medicare Other | Admitting: Cardiology

## 2014-07-24 VITALS — BP 130/86 | HR 56 | Ht 63.5 in | Wt 126.2 lb

## 2014-07-24 DIAGNOSIS — I4891 Unspecified atrial fibrillation: Secondary | ICD-10-CM

## 2014-07-24 DIAGNOSIS — I480 Paroxysmal atrial fibrillation: Secondary | ICD-10-CM

## 2014-07-24 MED ORDER — METOPROLOL SUCCINATE ER 25 MG PO TB24: 25.0000 mg | ORAL_TABLET | Freq: Every day | ORAL | Status: DC

## 2014-07-24 NOTE — Progress Notes (Signed)
HPI: FU atrial fibrillation and TIA. Echocardiogram in January of 2014 showed normal LV function, mild left ventricular hypertrophy, mild aortic insufficiency. A patent foramen ovale could not be excluded. Carotid Dopplers in January of 2014 revealed 0-39% bilateral stenosis and followup recommended in 2 years. Brain MRI in January 2014 showed no acute infarct, mild small vessel disease. TEE in February of 2014. Her LV function was normal. There was mild mitral valve prolapse with mild mitral regurgitation. There was trace aortic insufficiency. There was no PFO. Monitor in March of 2014 showed paroxysmal atrial fibrillation/flutter. She was therefore started on anticoagulation. Since I last saw her the patient denies any dyspnea on exertion, orthopnea, PND, pedal edema, palpitations, syncope or chest pain.    Current Outpatient Prescriptions  Medication Sig Dispense Refill  . Alpha-D-Galactosidase (BEANO PO) Take by mouth 2 (two) times daily.      . Alum & Mag Hydroxide-Simeth (MAGIC MOUTHWASH W/LIDOCAINE) SOLN Take 10 mLs by mouth 4 (four) times daily as needed. Swish and spit out 10 ml by mouth 4 times a day  240 mL  0  . atorvastatin (LIPITOR) 20 MG tablet TAKE 1 TABLET BY MOUTH DAILY  90 tablet  0  . Azelastine-Fluticasone (DYMISTA) 137-50 MCG/ACT SUSP Place 1 spray into the nose 2 (two) times daily.      . Bepotastine Besilate (BEPREVE) 1.5 % SOLN Use one drop in each eye twice a day      . bumetanide (BUMEX) 1 MG tablet TAKE 1 TABLET BY MOUTH 2 TIMES A DAY.  180 tablet  0  . buPROPion (WELLBUTRIN) 75 MG tablet TAKE 1 TABLET BY MOUTH TWICE A DAY.  180 tablet  0  . Calcium Carbonate-Vitamin D (CALCIUM 600/VITAMIN D) 600-400 MG-UNIT per tablet Take 1 tablet by mouth 2 (two) times daily.        . Cholecalciferol (VITAMIN D-3) 5000 UNITS TABS Take one tablet by mouth three times a week      . cyclobenzaprine (FLEXERIL) 10 MG tablet Take 10 mg by mouth 2 (two) times daily as needed. As  needed for back pain       . diclofenac sodium (VOLTAREN) 1 % GEL Apply as needed  9 Tube  3  . famotidine (PEPCID AC) 10 MG chewable tablet Chew 10 mg by mouth daily.      . fexofenadine (ALLEGRA) 60 MG tablet Take 1 tablet (60 mg total) by mouth 2 (two) times daily.  60 tablet  3  . fluocinonide gel (LIDEX) 0.05 % Apply topically as needed.      . Fluticasone-Salmeterol (ADVAIR DISKUS) 100-50 MCG/DOSE AEPB Inhale 1 puff into the lungs 2 (two) times daily.  180 each  1  . HYDROcodone-acetaminophen (VICODIN) 5-500 MG per tablet Take 1 tablet by mouth every 6 (six) hours as needed.        Marland Kitchen levocetirizine (XYZAL) 5 MG tablet Take 1 tablet (5 mg total) by mouth every evening.  90 tablet  3  . levothyroxine (SYNTHROID, LEVOTHROID) 50 MCG tablet TAKE 1 TABLET BY MOUTH DAILY  30 tablet  1  . Melatonin 5 MG TABS Take 3 tablets my mouth at bedtime      . metoprolol succinate (TOPROL-XL) 25 MG 24 hr tablet TAKE 1 TABLET BY MOUTH DAILY.  30 tablet  0  . NEXIUM 40 MG capsule TAKE 1 CAPSULE (40 MG TOTAL) BY MOUTH DAILY.  90 capsule  1  . nystatin (MYCOSTATIN) 100000 UNIT/ML suspension SWISH  BY MOUTH 10 ML UP TO 4 TIMES A DAY  240 mL  1  . potassium chloride (K-DUR,KLOR-CON) 10 MEQ tablet Take 1 tablet (10 mEq total) by mouth 2 (two) times daily.  180 tablet  0  . traZODone (DESYREL) 50 MG tablet TAKE 3 TABLETS AT BEDTIME.  270 tablet  0  . XARELTO 20 MG TABS tablet TAKE 1 TABLET BY MOUTH DAILY.  90 tablet  2  . fluticasone (FLONASE) 50 MCG/ACT nasal spray Place 2 sprays into the nose daily.  48 g  1  . traZODone (DESYREL) 50 MG tablet TAKE 3 TABLETS BY MOUTH AT BEDTIME.  270 tablet  0   No current facility-administered medications for this visit.     Past Medical History  Diagnosis Date  . Allergy   . Depression   . Hypertension   . Osteoporosis   . Hypothyroidism   . Asthma   . GERD (gastroesophageal reflux disease)   . Hyperlipidemia   . TIA (transient ischemic attack)   . Arthritis      hands  . Atrial fibrillation   . Atrial flutter     Past Surgical History  Procedure Laterality Date  . Partial hysterectomy  1989  . Colonoscopy    . Tee without cardioversion N/A 12/22/2012    Procedure: TRANSESOPHAGEAL ECHOCARDIOGRAM (TEE);  Surgeon: Lelon Perla, MD;  Location: [facility] ENDOSCOPY;  Service: Cardiovascular;  Laterality: N/A;    History   Social History  . Marital Status: Married    Spouse Name: N/A    Number of Children: 2  . Years of Education: N/A   Occupational History  .     Social History Main Topics  . Smoking status: Never Smoker   . Smokeless tobacco: Never Used  . Alcohol Use: Yes     Comment: Rare  . Drug Use: No  . Sexual Activity: Not on file   Other Topics Concern  . Not on file   Social History Narrative   Does have HPOA- sons Eddie Dibbles and Wille Glaser.   Does desire life support if not futile.   Not sure about feeding tubes.    ROS: no fevers or chills, productive cough, hemoptysis, dysphasia, odynophagia, melena, hematochezia, dysuria, hematuria, rash, seizure activity, orthopnea, PND, pedal edema, claudication. Remaining systems are negative.  Physical Exam: Well-developed well-nourished in no acute distress.  Skin is warm and dry.  HEENT is normal.  Neck is supple.  Chest is clear to auscultation with normal expansion.  Cardiovascular exam is regular rate and rhythm.  Abdominal exam nontender or distended. No masses palpated. Extremities show no edema. neuro grossly intact  ECG Sinus bradycardia, no ST changes.

## 2014-07-24 NOTE — Assessment & Plan Note (Signed)
Patient remains in sinus rhythm. Continue Toprol and xarelto. Check hemoglobin and renal function in December. She will need lifelong anticoagulation given history of TIA.

## 2014-07-24 NOTE — Assessment & Plan Note (Signed)
Continue statin. Not on aspirin given need for anticoagulation. Followup carotid Dopplers January 2016.

## 2014-07-24 NOTE — Assessment & Plan Note (Signed)
Blood pressure controlled.  Continue metoprolol. 

## 2014-07-24 NOTE — Patient Instructions (Signed)
Your physician wants you to follow-up in: [place] will receive a reminder letter in the mail two months in advance. If you don't receive a letter, please call our office to schedule the follow-up appointment.   Your physician recommends that you return for lab work in: December  Your physician has requested that you have a carotid duplex. This test is an ultrasound of the carotid arteries in your neck. It looks at blood flow through these arteries that supply the brain with blood. Allow one hour for this exam. There are no restrictions or special instructions.DUE IN January

## 2014-08-07 ENCOUNTER — Other Ambulatory Visit: Payer: Self-pay | Admitting: Family Medicine

## 2014-08-11 ENCOUNTER — Ambulatory Visit (INDEPENDENT_AMBULATORY_CARE_PROVIDER_SITE_OTHER): Payer: Medicare Other | Admitting: Family Medicine

## 2014-08-11 VITALS — BP 122/68 | HR 63 | Temp 97.4°F | Resp 18 | Ht 64.0 in | Wt 129.0 lb

## 2014-08-11 DIAGNOSIS — R05 Cough: Secondary | ICD-10-CM

## 2014-08-11 DIAGNOSIS — R059 Cough, unspecified: Secondary | ICD-10-CM

## 2014-08-11 DIAGNOSIS — J0100 Acute maxillary sinusitis, unspecified: Secondary | ICD-10-CM

## 2014-08-11 MED ORDER — BENZONATATE 100 MG PO CAPS: 200.0000 mg | ORAL_CAPSULE | Freq: Two times a day (BID) | ORAL | Status: DC | PRN

## 2014-08-11 MED ORDER — HYDROCODONE-HOMATROPINE 5-1.5 MG/5ML PO SYRP: 5.0000 mL | ORAL_SOLUTION | Freq: Every evening | ORAL | Status: DC | PRN

## 2014-08-11 MED ORDER — AZITHROMYCIN 250 MG PO TABS: ORAL_TABLET | ORAL | Status: DC

## 2014-08-11 NOTE — Progress Notes (Signed)
Chief Complaint:  Chief Complaint  Patient presents with  . Sinusitis    pt states she is having lots of mucous drainage  . Cough    pt states she has an light productive cough    HPI: Patricia Peck is a 68 y.o. female who is here for a 1 week history of Mucus drainage, slightly light productive cough, mostly yeloow. She has taken [employer] mucines, She has associated sinus pain. She feels her head is stopped up. She blows her nose all the time. 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 She has been to [place]  Recently with her husband for reunion, they drove . Feels her ears are all stuffed up No fevers or chills, SOB, CP, wheezing.  Past Medical History  Diagnosis Date  . Allergy   . Depression   . Hypertension   . Osteoporosis   . Hypothyroidism   . Asthma   . GERD (gastroesophageal reflux disease)   . Hyperlipidemia   . TIA (transient ischemic attack)   . Arthritis     hands  . Atrial fibrillation   . Atrial flutter    Past Surgical History  Procedure Laterality Date  . Partial hysterectomy  1989  . Colonoscopy    . Tee without cardioversion N/A 12/22/2012    Procedure: TRANSESOPHAGEAL ECHOCARDIOGRAM (TEE);  Surgeon: Lelon Perla, MD;  Location: [facility] ENDOSCOPY;  Service: Cardiovascular;  Laterality: N/A;   History   Social History  . Marital Status: Married    Spouse Name: N/A    Number of Children: 2  . Years of Education: N/A   Occupational History  .     Social History Main Topics  . Smoking status: Never Smoker   . Smokeless tobacco: Never Used  . Alcohol Use: Yes     Comment: Rare  . Drug Use: No  . Sexual Activity: None   Other Topics Concern  . None   Social History Narrative   Does have HPOA- sons Eddie Dibbles and Wille Glaser.   Does desire life support if not futile.   Not sure about feeding tubes.   Family History  Problem Relation Age of Onset  . Breast cancer Mother   . Drug abuse Son   . Depression Son   . Stroke Paternal Aunt   .  Heart disease Paternal Grandfather   . Diabetes Maternal Grandfather    No Known Allergies Prior to Admission medications   Medication Sig Start Date End Date Taking? Authorizing Provider  Alpha-D-Galactosidase (BEANO PO) Take by mouth 2 (two) times daily.   Yes Historical Provider, MD  Alum & Mag Hydroxide-Simeth (MAGIC MOUTHWASH W/LIDOCAINE) SOLN Take 10 mLs by mouth 4 (four) times daily as needed. Swish and spit out 10 ml by mouth 4 times a day 08/24/13  Yes Spencer Copland, MD  atorvastatin (LIPITOR) 20 MG tablet TAKE 1 TABLET BY MOUTH DAILY 07/18/14  Yes Lucille Passy, MD  Azelastine-Fluticasone [facility]) 137-50 MCG/ACT SUSP Place 1 spray into the nose 2 (two) times daily.   Yes Historical Provider, MD  Bepotastine Besilate (BEPREVE) 1.5 % SOLN Use one drop in each eye twice a day   Yes Historical Provider, MD  bumetanide (BUMEX) 1 MG tablet TAKE 1 TABLET BY MOUTH 2 TIMES A DAY. 07/17/14  Yes Lucille Passy, MD  buPROPion (WELLBUTRIN) 75 MG tablet TAKE 1 TABLET BY MOUTH TWICE A DAY. 07/17/14  Yes Lucille Passy, MD  Calcium Carbonate-Vitamin D (CALCIUM 600/VITAMIN  D) 600-400 MG-UNIT per tablet Take 1 tablet by mouth 2 (two) times daily.     Yes Historical Provider, MD  Cholecalciferol (VITAMIN D-3) 5000 UNITS TABS Take one tablet by mouth three times a week   Yes Historical Provider, MD  cyclobenzaprine (FLEXERIL) 10 MG tablet Take 10 mg by mouth 2 (two) times daily as needed. As needed for back pain    Yes Historical Provider, MD  diclofenac sodium (VOLTAREN) 1 % GEL Apply as needed 08/24/13  Yes Owens Loffler, MD  famotidine (PEPCID AC) 10 MG chewable tablet Chew 10 mg by mouth daily.   Yes Historical Provider, MD  fexofenadine (ALLEGRA) 60 MG tablet Take 1 tablet (60 mg total) by mouth 2 (two) times daily. 08/10/13  Yes Lucille Passy, MD  fluocinonide gel (LIDEX) 0.05 % Apply topically as needed.   Yes Historical Provider, MD  Fluticasone-Salmeterol (ADVAIR DISKUS) 100-50 MCG/DOSE AEPB Inhale 1  puff into the lungs 2 (two) times daily. 08/24/13  Yes Spencer Copland, MD  HYDROcodone-acetaminophen (VICODIN) 5-500 MG per tablet Take 1 tablet by mouth every 6 (six) hours as needed.     Yes Historical Provider, MD  levocetirizine (XYZAL) 5 MG tablet Take 1 tablet (5 mg total) by mouth every evening. 08/24/13  Yes Spencer Copland, MD  levothyroxine (SYNTHROID, LEVOTHROID) 50 MCG tablet TAKE 1 TABLET BY MOUTH DAILY. 08/07/14  Yes Lucille Passy, MD  Melatonin 5 MG TABS Take 3 tablets my mouth at bedtime   Yes Historical Provider, MD  metoprolol succinate (TOPROL-XL) 25 MG 24 hr tablet Take 1 tablet (25 mg total) by mouth daily. 07/24/14  Yes Lelon Perla, MD  NEXIUM 40 MG capsule TAKE 1 CAPSULE (40 MG TOTAL) BY MOUTH DAILY. 03/13/14  Yes Lucille Passy, MD  nystatin (MYCOSTATIN) 100000 UNIT/ML suspension SWISH BY MOUTH 10 ML UP TO 4 TIMES A DAY 08/24/13  Yes Spencer Copland, MD  potassium chloride (K-DUR,KLOR-CON) 10 MEQ tablet Take 1 tablet (10 mEq total) by mouth 2 (two) times daily. 04/12/14  Yes Lucille Passy, MD  traZODone (DESYREL) 50 MG tablet TAKE 3 TABLETS AT BEDTIME. 04/10/14  Yes Lucille Passy, MD  XARELTO 20 MG TABS tablet TAKE 1 TABLET BY MOUTH DAILY. 07/19/14  Yes Lelon Perla, MD     ROS: The patient denies fevers, chills, night sweats, unintentional weight loss, chest pain, palpitations, wheezing, dyspnea on exertion, nausea, vomiting, abdominal pain, dysuria, hematuria, melena, numbness, weakness, or tingling.   All other systems have been reviewed and were otherwise negative with the exception of those mentioned in the HPI and as above.    PHYSICAL EXAM: Filed Vitals:   08/11/14 1022  BP: 122/68  Pulse: 63  Temp: 97.4 F (36.3 C)  Resp: 18   Filed Vitals:   08/11/14 1022  Height: 5\' 4"  (1.626 m)  Weight: 129 lb (58.514 kg)   Body mass index is 22.13 kg/(m^2).  General: Alert, no acute distress HEENT:  Normocephalic, atraumatic, oropharynx patent. EOMI, PERRLA,  Erythematous throat, no exudates, TM normal, + sinus tenderness, + erythematous/boggy nasal mucosa Cardiovascular:  Regular rate and rhythm, no rubs murmurs or gallops.  No Carotid bruits, radial pulse intact. No pedal edema.  Respiratory: Clear to auscultation bilaterally.  No wheezes, rales, or rhonchi.  No cyanosis, no use of accessory musculature GI: No organomegaly, abdomen is soft and non-tender, positive bowel sounds.  No masses. Skin: No rashes. Neurologic: Facial musculature symmetric. Psychiatric: Patient is appropriate throughout our interaction.  Lymphatic: No cervical lymphadenopathy Musculoskeletal: Gait intact.   LABS: Results for orders placed in visit on 04/05/14  CBC WITH DIFFERENTIAL      Result Value Ref Range   WBC 8.1  4.0 - 10.5 K/uL   RBC 4.48  3.87 - 5.11 Mil/uL   Hemoglobin 13.5  12.0 - 15.0 g/dL   HCT 40.2  36.0 - 46.0 %   MCV 89.7  78.0 - 100.0 fl   MCHC 33.5  30.0 - 36.0 g/dL   RDW 13.4  11.5 - 15.5 %   Platelets 321.0  150.0 - 400.0 K/uL   Neutrophils Relative % 59.9  43.0 - 77.0 %   Lymphocytes Relative 25.0  12.0 - 46.0 %   Monocytes Relative 10.5  3.0 - 12.0 %   Eosinophils Relative 3.9  0.0 - 5.0 %   Basophils Relative 0.7  0.0 - 3.0 %   Neutro Abs 4.9  1.4 - 7.7 K/uL   Lymphs Abs 2.0  0.7 - 4.0 K/uL   Monocytes Absolute 0.9  0.1 - 1.0 K/uL   Eosinophils Absolute 0.3  0.0 - 0.7 K/uL   Basophils Absolute 0.1  0.0 - 0.1 K/uL  COMPREHENSIVE METABOLIC PANEL      Result Value Ref Range   Sodium 141  135 - 145 mEq/L   Potassium 3.5  3.5 - 5.1 mEq/L   Chloride 102  96 - 112 mEq/L   CO2 32  19 - 32 mEq/L   Glucose, Bld 90  70 - 99 mg/dL   BUN 15  6 - 23 mg/dL   Creatinine, Ser 0.9  0.4 - 1.2 mg/dL   Total Bilirubin 0.6  0.2 - 1.2 mg/dL   Alkaline Phosphatase 63  39 - 117 U/L   AST 17  0 - 37 U/L   ALT 21  0 - 35 U/L   Total Protein 7.3  6.0 - 8.3 g/dL   Albumin 4.3  3.5 - 5.2 g/dL   Calcium 9.3  8.4 - 10.5 mg/dL   GFR 70.52  >60.00 mL/min    LIPID PANEL      Result Value Ref Range   Cholesterol 170  0 - 200 mg/dL   Triglycerides 91.0  0.0 - 149.0 mg/dL   HDL 49.90  >39.00 mg/dL   VLDL 18.2  0.0 - 40.0 mg/dL   LDL Cholesterol 102 (*) 0 - 99 mg/dL   Total CHOL/HDL Ratio 3     NonHDL 120.10    TSH      Result Value Ref Range   TSH 1.39  0.35 - 4.50 uIU/mL  T4, FREE      Result Value Ref Range   Free T4 0.84  0.60 - 1.60 ng/dL     EKG/XRAY:   Primary read interpreted by Dr. Marin Comment at [facility].   ASSESSMENT/PLAN: Encounter Diagnoses  Name Primary?  . Acute maxillary sinusitis, recurrence not specified Yes  . Cough    Rx z pack, tessalon perles, hycodan Try sxs firt and if no improvement then take abx F/u prn  Gross sideeffects, risk and benefits, and alternatives of medications d/w patient. Patient is aware that all medications have potential sideeffects and we are unable to predict every sideeffect or drug-drug interaction that may occur.  LE, [place], DO 08/14/2014 6:32 AM

## 2014-08-11 NOTE — Patient Instructions (Signed)

## 2014-09-27 ENCOUNTER — Telehealth: Payer: Self-pay | Admitting: *Deleted

## 2014-09-27 MED ORDER — NEXIUM 40 MG PO CPDR: DELAYED_RELEASE_CAPSULE | ORAL | Status: DC

## 2014-09-27 NOTE — Addendum Note (Signed)
Addended by: Modena Nunnery on: 09/27/2014 04:16 PM   Modules accepted: Orders

## 2014-09-27 NOTE — Telephone Encounter (Signed)
Pt dropped letter off at office indicating her insurance will no longer cover Nexium. Pt requested paperwork be completed, but there is no form to complete. This is only a notification that it will not be covered. Pt will still be able to receive medication, but will have to pay out of pocket, or, she may try an alternate medication. Lm on pts vm requesting a call back

## 2014-09-27 NOTE — Telephone Encounter (Signed)
Spoke to pt and informed her of the perimeters of her letter. I advised pt that she would still be able to receive the Rx but would have to pay out of pocket. Pt verbally expressed understanding and is requesting new Rx to pharmacy on file; sent as requested

## 2014-10-04 LAB — BASIC METABOLIC PANEL WITH GFR
BUN: 14 mg/dL (ref 6–23)
CO2: 29 mEq/L (ref 19–32)
Calcium: 9.5 mg/dL (ref 8.4–10.5)
Chloride: 102 mEq/L (ref 96–112)
Creat: 0.83 mg/dL (ref 0.50–1.10)
GFR, EST AFRICAN AMERICAN: 83 mL/min
GFR, EST NON AFRICAN AMERICAN: 72 mL/min
Glucose, Bld: 82 mg/dL (ref 70–99)
POTASSIUM: 4.1 meq/L (ref 3.5–5.3)
Sodium: 138 mEq/L (ref 135–145)

## 2014-10-04 LAB — CBC
HCT: 38.4 % (ref 36.0–46.0)
HEMOGLOBIN: 13 g/dL (ref 12.0–15.0)
MCH: 29.5 pg (ref 26.0–34.0)
MCHC: 33.9 g/dL (ref 30.0–36.0)
MCV: 87.3 fL (ref 78.0–100.0)
MPV: 10.1 fL (ref 9.4–12.4)
PLATELETS: 318 10*3/uL (ref 150–400)
RBC: 4.4 MIL/uL (ref 3.87–5.11)
RDW: 13.5 % (ref 11.5–15.5)
WBC: 7.4 10*3/uL (ref 4.0–10.5)

## 2014-10-12 ENCOUNTER — Other Ambulatory Visit: Payer: Self-pay | Admitting: Family Medicine

## 2014-10-12 NOTE — Telephone Encounter (Signed)
Pt request status of refills and advised refill requested were approved and sent to [facility]; pt will cb for appt.

## 2014-10-13 ENCOUNTER — Other Ambulatory Visit: Payer: Self-pay | Admitting: Family Medicine

## 2014-10-19 ENCOUNTER — Ambulatory Visit (INDEPENDENT_AMBULATORY_CARE_PROVIDER_SITE_OTHER): Payer: Medicare Other | Admitting: Internal Medicine

## 2014-10-19 ENCOUNTER — Encounter: Payer: Self-pay | Admitting: Internal Medicine

## 2014-10-19 VITALS — BP 116/68 | HR 65 | Temp 98.1°F | Wt 125.0 lb

## 2014-10-19 DIAGNOSIS — H6123 Impacted cerumen, bilateral: Secondary | ICD-10-CM

## 2014-10-19 DIAGNOSIS — J069 Acute upper respiratory infection, unspecified: Secondary | ICD-10-CM

## 2014-10-19 MED ORDER — AZITHROMYCIN 250 MG PO TABS: ORAL_TABLET | ORAL | Status: DC

## 2014-10-19 NOTE — Progress Notes (Signed)
Pre visit review using our clinic review tool, if applicable. No additional management support is needed unless otherwise documented below in the visit note. 

## 2014-10-19 NOTE — Patient Instructions (Signed)
Upper Respiratory Infection, Adult An upper respiratory infection (URI) is also sometimes known as the common cold. The upper respiratory tract includes the nose, sinuses, throat, trachea, and bronchi. Bronchi are the airways leading to the lungs. Most people improve within 1 week, but symptoms can last up to 2 weeks. A residual cough may last even longer.  CAUSES Many different viruses can infect the tissues lining the upper respiratory tract. The tissues become irritated and inflamed and often become very moist. Mucus production is also common. A cold is contagious. You can easily spread the virus to others by oral contact. This includes kissing, sharing a glass, coughing, or sneezing. Touching your mouth or nose and then touching a surface, which is then touched by another person, can also spread the virus. SYMPTOMS  Symptoms typically develop 1 to 3 days after you come in contact with a cold virus. Symptoms vary from person to person. They may include:  Runny nose.  Sneezing.  Nasal congestion.  Sinus irritation.  Sore throat.  Loss of voice (laryngitis).  Cough.  Fatigue.  Muscle aches.  Loss of appetite.  Headache.  Low-grade fever. DIAGNOSIS  You might diagnose your own cold based on familiar symptoms, since most people get a cold 2 to 3 times a year. Your caregiver can confirm this based on your exam. Most importantly, your caregiver can check that your symptoms are not due to another disease such as strep throat, sinusitis, pneumonia, asthma, or epiglottitis. Blood tests, throat tests, and X-rays are not necessary to diagnose a common cold, but they may sometimes be helpful in excluding other more serious diseases. Your caregiver will decide if any further tests are required. RISKS AND COMPLICATIONS  You may be at risk for a more severe case of the common cold if you smoke cigarettes, have chronic heart disease (such as heart failure) or lung disease (such as asthma), or if  you have a weakened immune system. The very young and very old are also at risk for more serious infections. Bacterial sinusitis, middle ear infections, and bacterial pneumonia can complicate the common cold. The common cold can worsen asthma and chronic obstructive pulmonary disease (COPD). Sometimes, these complications can require emergency medical care and may be life-threatening. PREVENTION  The best way to protect against getting a cold is to practice good hygiene. Avoid oral or hand contact with people with cold symptoms. Wash your hands often if contact occurs. There is no clear evidence that vitamin C, vitamin E, echinacea, or exercise reduces the chance of developing a cold. However, it is always recommended to get plenty of rest and practice good nutrition. TREATMENT  Treatment is directed at relieving symptoms. There is no cure. Antibiotics are not effective, because the infection is caused by a virus, not by bacteria. Treatment may include:  Increased fluid intake. Sports drinks offer valuable electrolytes, sugars, and fluids.  Breathing heated mist or steam (vaporizer or shower).  Eating chicken soup or other clear broths, and maintaining good nutrition.  Getting plenty of rest.  Using gargles or lozenges for comfort.  Controlling fevers with ibuprofen or acetaminophen as directed by your caregiver.  Increasing usage of your inhaler if you have asthma. Zinc gel and zinc lozenges, taken in the first 24 hours of the common cold, can shorten the duration and lessen the severity of symptoms. Pain medicines may help with fever, muscle aches, and throat pain. A variety of non-prescription medicines are available to treat congestion and runny nose. Your caregiver   can make recommendations and may suggest nasal or lung inhalers for other symptoms.  HOME CARE INSTRUCTIONS   Only take over-the-counter or prescription medicines for pain, discomfort, or fever as directed by your  caregiver.  Use a warm mist humidifier or inhale steam from a shower to increase air moisture. This may keep secretions moist and make it easier to breathe.  Drink enough water and fluids to keep your urine clear or pale yellow.  Rest as needed.  Return to work when your temperature has returned to normal or as your caregiver advises. You may need to stay home longer to avoid infecting others. You can also use a face mask and careful hand washing to prevent spread of the virus. SEEK MEDICAL CARE IF:   After the first few days, you feel you are getting worse rather than better.  You need your caregiver's advice about medicines to control symptoms.  You develop chills, worsening shortness of breath, or brown or red sputum. These may be signs of pneumonia.  You develop yellow or brown nasal discharge or pain in the face, especially when you bend forward. These may be signs of sinusitis.  You develop a fever, swollen neck glands, pain with swallowing, or white areas in the back of your throat. These may be signs of strep throat. SEEK IMMEDIATE MEDICAL CARE IF:   You have a fever.  You develop severe or persistent headache, ear pain, sinus pain, or chest pain.  You develop wheezing, a prolonged cough, cough up blood, or have a change in your usual mucus (if you have chronic lung disease).  You develop sore muscles or a stiff neck. Document Released: 04/15/2001 Document Revised: 01/12/2012 Document Reviewed: 01/25/2014 ExitCare Patient Information 2015 [employer]. This information is not intended to replace advice given to you by your health care provider. Make sure you discuss any questions you have with your health care provider.  

## 2014-10-19 NOTE — Progress Notes (Signed)
HPI  Pt presents to the clinic today with c/o cough, chest congestion and shortness of breath. She reports this started 1 week ago. The cough is productive of thick green mucous. She is not blowing anything out of her nose but does feel congested. She denies fever but has had chills and body aches. She has not taken anything OTC. She does have a history of seasonal allergies and asthma. She does take Allegra, Xyzal and Dymista. She has not had sick contacts that she is aware of.  Review of Systems      Past Medical History  Diagnosis Date  . Allergy   . Depression   . Hypertension   . Osteoporosis   . Hypothyroidism   . Asthma   . GERD (gastroesophageal reflux disease)   . Hyperlipidemia   . TIA (transient ischemic attack)   . Arthritis     hands  . Atrial fibrillation   . Atrial flutter     Family History  Problem Relation Age of Onset  . Breast cancer Mother   . Drug abuse Son   . Depression Son   . Stroke Paternal Aunt   . Heart disease Paternal Grandfather   . Diabetes Maternal Grandfather     History   Social History  . Marital Status: Married    Spouse Name: N/A    Number of Children: 2  . Years of Education: N/A   Occupational History  .     Social History Main Topics  . Smoking status: Never Smoker   . Smokeless tobacco: Never Used  . Alcohol Use: Yes     Comment: Rare  . Drug Use: No  . Sexual Activity: Not on file   Other Topics Concern  . Not on file   Social History Narrative   Does have HPOA- sons Eddie Dibbles and Wille Glaser.   Does desire life support if not futile.   Not sure about feeding tubes.    No Known Allergies   Constitutional: Positive headache, fatigue. Denies fever or abrupt weight changes.  HEENT:  Positive nasal congestion. Denies eye redness, eye pain, pressure behind the eyes, facial pain, ear pain, ringing in the ears, wax buildup, runny nose or sore throat. Respiratory: Positive cough and shortness of breath. Denies difficulty  breathing.  Cardiovascular: Denies chest pain, chest tightness, palpitations or swelling in the hands or feet.   No other specific complaints in a complete review of systems (except as listed in HPI above).  Objective:   BP 116/68 mmHg  Pulse 65  Temp(Src) 98.1 F (36.7 C) (Oral)  Wt 125 lb (56.7 kg)  SpO2 98% Wt Readings from Last 3 Encounters:  10/19/14 125 lb (56.7 kg)  08/11/14 129 lb (58.514 kg)  07/24/14 126 lb 3.2 oz (57.244 kg)     General: Appears her stated age, ill appearing in NAD. HEENT: Head: normal shape and size, no sinus tenderness noted; Eyes: sclera white, no icterus, conjunctiva pink; Ears: bilateral cerumen impaction; Nose: mucosa pink and moist, septum midline; Throat/Mouth: + PND. Teeth present, mucosa erythematous and moist, no exudate noted, no lesions or ulcerations noted.  Neck: No adenopathy noted. Cardiovascular: Normal rate and rhythm. S1,S2 noted.  No murmur, rubs or gallops noted. No JVD or BLE edema. No carotid bruits noted. Pulmonary/Chest: Normal effort and positive vesicular breath sounds. No respiratory distress. No wheezes, rales or ronchi noted.      Assessment & Plan:   Upper Respiratory Infection:  Get some rest and drink plenty  of water Tylenol for body aches eRx for Azithromax x 5 days Delsym OTC for cough  Bilateral Cerumen Impaction:  We can wash this out for you but better to do it when you are not sick Make a follow up appt for [place] to get this out for you Try Debrox OTC  RTC as needed or if symptoms persist.

## 2014-10-23 ENCOUNTER — Encounter: Payer: Self-pay | Admitting: Family Medicine

## 2014-10-23 ENCOUNTER — Ambulatory Visit (INDEPENDENT_AMBULATORY_CARE_PROVIDER_SITE_OTHER): Payer: Medicare Other | Admitting: Family Medicine

## 2014-10-23 VITALS — BP 116/70 | HR 55 | Temp 97.7°F | Wt 125.5 lb

## 2014-10-23 DIAGNOSIS — N750 Cyst of Bartholin's gland: Secondary | ICD-10-CM

## 2014-10-23 DIAGNOSIS — J069 Acute upper respiratory infection, unspecified: Secondary | ICD-10-CM

## 2014-10-23 MED ORDER — SULFAMETHOXAZOLE-TRIMETHOPRIM 800-160 MG PO TABS: 1.0000 | ORAL_TABLET | Freq: Two times a day (BID) | ORAL | Status: AC

## 2014-10-23 MED ORDER — TRAZODONE HCL 50 MG PO TABS: ORAL_TABLET | ORAL | Status: DC

## 2014-10-23 MED ORDER — ATORVASTATIN CALCIUM 20 MG PO TABS: ORAL_TABLET | ORAL | Status: DC

## 2014-10-23 NOTE — Patient Instructions (Signed)
Bartholin's Cyst or Abscess °Bartholin's glands are small glands located within the folds of skin (labia) along the sides of the lower opening of the vagina (birth canal). A cyst may develop when the duct of the gland becomes blocked. When this happens, fluid that accumulates within the cyst can become infected. This is known as an abscess. The Bartholin gland produces a mucous fluid to lubricate the outside of the vagina during sexual intercourse. °SYMPTOMS  °· Patients with a small cyst may not have any symptoms. °· Mild discomfort to severe pain depending on the size of the cyst and if it is infected (abscess). °· Pain, redness, and swelling around the lower opening of the vagina. °· Painful intercourse. °· Pressure in the perineal area. °· Swelling of the lips of the vagina (labia). °· The cyst or abscess can be on one side or both sides of the vagina. °DIAGNOSIS  °· A large swelling is seen in the lower vagina area by your caregiver. °· Painful to touch. °· Redness and pain, if it is an abscess. °TREATMENT  °· Sometimes the cyst will go away on its own. °· Apply warm wet compresses to the area or take hot sitz baths several times a day. °· An incision to drain the cyst or abscess with local anesthesia. °· Culture the pus, if it is an abscess. °· Antibiotic treatment, if it is an abscess. °· Cut open the gland and suture the edges to make the opening of the gland bigger (marsupialization). °· Remove the whole gland if the cyst or abscess returns. °PREVENTION  °· Practice good hygiene. °· Clean the vaginal area with a mild soap and soft cloth when bathing. °· Do not rub hard in the vaginal area when bathing. °· Protect the crotch area with a padded cushion if you take long bike rides or ride horses. °· Be sure you are well lubricated when you have sexual intercourse. °HOME CARE INSTRUCTIONS  °· If your cyst or abscess was opened, a small piece of gauze, or a drain, may have been placed in the wound to allow  drainage. Do not remove this gauze or drain unless directed by your caregiver. °· Wear feminine pads, not tampons, as needed for any drainage or bleeding. °· If antibiotics were prescribed, take them exactly as directed. Finish the entire course. °· Only take over-the-counter or prescription medicines for pain, discomfort, or fever as directed by your caregiver. °SEEK IMMEDIATE MEDICAL CARE IF:  °· You have an increase in pain, redness, swelling, or drainage. °· You have bleeding from the wound which results in the use of more than the number of pads suggested by your caregiver in 24 hours. °· You have chills. °· You have a fever. °· You develop any new problems (symptoms) or aggravation of your existing condition. °MAKE SURE YOU:  °· Understand these instructions. °· Will watch your condition. °· Will get help right away if you are not doing well or get worse. °Document Released: 10/20/2005 Document Revised: 01/12/2012 Document Reviewed: 06/07/2008 °ExitCare® Patient Information ©2015 [employer]. This information is not intended to replace advice given to you by your health care provider. Make sure you discuss any questions you have with your health care provider. ° °

## 2014-10-23 NOTE — Progress Notes (Signed)
Subjective:   Patient ID: Patricia Peck, female    DOB: 02/07/1946, 68 y.o.   MRN: [id number]  Patricia Peck is a pleasant 68 y.o. year old female who presents to clinic today with her husband for Follow-up and Recurrent Skin Infections  on 10/23/2014  HPI: URI- saw Webb Silversmith on 12/17 (4 days ago)- for URI symptoms. Note reviewed.  Given zpack- has one dose left- feels better but still has a lot of drainage and congestion.  ?vaginal infection- a couple of weeks ago, noticed painful lump in her right vaginal area.  She is not sure if it is draining.  She has not tried anything for it.  No fevers.  No nausea or vomiting.  Has never had anything like this before.  She is not sure if it is growing in size.  Current Outpatient Prescriptions on File Prior to Visit  Medication Sig Dispense Refill  . Alpha-D-Galactosidase (BEANO PO) Take by mouth 2 (two) times daily.    . Alum & Mag Hydroxide-Simeth (MAGIC MOUTHWASH W/LIDOCAINE) SOLN Take 10 mLs by mouth 4 (four) times daily as needed. Swish and spit out 10 ml by mouth 4 times a day 240 mL 0  . Azelastine-Fluticasone (DYMISTA) 137-50 MCG/ACT SUSP Place 1 spray into the nose 2 (two) times daily.    Marland Kitchen azithromycin (ZITHROMAX) 250 MG tablet Take 2 tabs today, then 1 tab daily x 4 days 6 tablet 0  . benzonatate (TESSALON) 100 MG capsule Take 2 capsules (200 mg total) by mouth 2 (two) times daily as needed. 30 capsule 1  . Bepotastine Besilate (BEPREVE) 1.5 % SOLN Use one drop in each eye twice a day    . bumetanide (BUMEX) 1 MG tablet TAKE 1 TABLET BY MOUTH 2 TIMES A DAY. 180 tablet 0  . buPROPion (WELLBUTRIN) 75 MG tablet TAKE 1 TABLET BY MOUTH TWICE A DAY. 60 tablet 0  . Calcium Carbonate-Vitamin D (CALCIUM 600/VITAMIN D) 600-400 MG-UNIT per tablet Take 1 tablet by mouth 2 (two) times daily.      . Cholecalciferol (VITAMIN D-3) 5000 UNITS TABS Take one tablet by mouth three times a week    . cyclobenzaprine (FLEXERIL) 10 MG tablet Take 10 mg by  mouth 2 (two) times daily as needed. As needed for back pain     . diclofenac sodium (VOLTAREN) 1 % GEL Apply as needed 9 Tube 3  . famotidine (PEPCID AC) 10 MG chewable tablet Chew 10 mg by mouth daily.    . fexofenadine (ALLEGRA) 60 MG tablet Take 1 tablet (60 mg total) by mouth 2 (two) times daily. 60 tablet 3  . fluocinonide gel (LIDEX) 0.05 % Apply topically as needed.    . Fluticasone-Salmeterol (ADVAIR DISKUS) 100-50 MCG/DOSE AEPB Inhale 1 puff into the lungs 2 (two) times daily. 180 each 1  . HYDROcodone-acetaminophen (VICODIN) 5-500 MG per tablet Take 1 tablet by mouth every 6 (six) hours as needed.      Marland Kitchen levocetirizine (XYZAL) 5 MG tablet Take 1 tablet (5 mg total) by mouth every evening. 90 tablet 3  . levothyroxine (SYNTHROID, LEVOTHROID) 50 MCG tablet TAKE 1 TABLET BY MOUTH DAILY. 90 tablet 0  . Melatonin 5 MG TABS Take 3 tablets my mouth at bedtime    . metoprolol succinate (TOPROL-XL) 25 MG 24 hr tablet Take 1 tablet (25 mg total) by mouth daily. 90 tablet 3  . NEXIUM 40 MG capsule TAKE 1 CAPSULE (40 MG TOTAL) BY MOUTH DAILY. 90 capsule  1  . nystatin (MYCOSTATIN) 100000 UNIT/ML suspension SWISH BY MOUTH 10 ML UP TO 4 TIMES A DAY 240 mL 1  . potassium chloride (K-DUR) 10 MEQ tablet TAKE 1 TABLET BY MOUTH 2 TIMES A DAY. 180 tablet 0  . XARELTO 20 MG TABS tablet TAKE 1 TABLET BY MOUTH DAILY. 90 tablet 2   No current facility-administered medications on file prior to visit.    No Known Allergies  Past Medical History  Diagnosis Date  . Allergy   . Depression   . Hypertension   . Osteoporosis   . Hypothyroidism   . Asthma   . GERD (gastroesophageal reflux disease)   . Hyperlipidemia   . TIA (transient ischemic attack)   . Arthritis     hands  . Atrial fibrillation   . Atrial flutter     Past Surgical History  Procedure Laterality Date  . Partial hysterectomy  1989  . Colonoscopy    . Tee without cardioversion N/A 12/22/2012    Procedure: TRANSESOPHAGEAL  ECHOCARDIOGRAM (TEE);  Surgeon: Lelon Perla, MD;  Location: [facility] ENDOSCOPY;  Service: Cardiovascular;  Laterality: N/A;    Family History  Problem Relation Age of Onset  . Breast cancer Mother   . Drug abuse Son   . Depression Son   . Stroke Paternal Aunt   . Heart disease Paternal Grandfather   . Diabetes Maternal Grandfather     History   Social History  . Marital Status: Married    Spouse Name: N/A    Number of Children: 2  . Years of Education: N/A   Occupational History  .     Social History Main Topics  . Smoking status: Never Smoker   . Smokeless tobacco: Never Used  . Alcohol Use: Yes     Comment: Rare  . Drug Use: No  . Sexual Activity: Not on file   Other Topics Concern  . Not on file   Social History Narrative   Does have HPOA- sons Eddie Dibbles and Wille Glaser.   Does desire life support if not futile.   Not sure about feeding tubes.   The PMH, PSH, Social History, Family History, Medications, and allergies have been reviewed in [facility], and have been updated if relevant.   Review of Systems  Constitutional: Negative for fever, chills and fatigue.  HENT: Positive for congestion and postnasal drip. Negative for sinus pressure and trouble swallowing.   Respiratory: Negative for shortness of breath, wheezing and stridor.   Gastrointestinal: Negative.   Genitourinary: Positive for vaginal pain. Negative for dysuria, frequency, hematuria, flank pain, difficulty urinating and pelvic pain.  Skin: Negative.   Neurological: Negative.   Psychiatric/Behavioral: Negative.   All other systems reviewed and are negative.      Objective:    BP 116/70 mmHg  Pulse 55  Temp(Src) 97.7 F (36.5 C) (Oral)  Wt 125 lb 8 oz (56.926 kg)  SpO2 98%   Physical Exam  Constitutional: She is oriented to person, place, and time. She appears well-developed and well-nourished. No distress.  HENT:  Head: Normocephalic.  Eyes: Conjunctivae are normal.  Neck: Normal range of motion.    Cardiovascular: Normal rate and normal heart sounds.   Pulmonary/Chest: Effort normal and breath sounds normal.  Genitourinary:    There is tenderness and lesion on the right labia. There is no rash or injury on the right labia. There is no rash, tenderness, lesion or injury on the left labia.  Lymphadenopathy:  Right: No inguinal adenopathy present.       Left: No inguinal adenopathy present.  Neurological: She is alert and oriented to person, place, and time.  Skin: Skin is warm and dry.  Psychiatric: She has a normal mood and affect. Her behavior is normal. Thought content normal.  Nursing note and vitals reviewed.         Assessment & Plan:   Bartholin gland cyst No Follow-up on file.

## 2014-10-23 NOTE — Assessment & Plan Note (Signed)
Symptoms improving. Advised to take last dose of azithromycin, continue supportive care.

## 2014-10-23 NOTE — Assessment & Plan Note (Signed)
New- small and non fluctuant. She has been on zpack but may not provide appropriate coverage.  Will add 3 day course of Bactrim DS twice daily. Advised warm baths and or hot compresses. Call or return to clinic prn if these symptoms worsen or fail to improve as anticipated. Given handout about bartholin gland cysts, supportive care and treatment- reviewed hand out with pt.

## 2014-10-23 NOTE — Progress Notes (Signed)
Pre visit review using our clinic review tool, if applicable. No additional management support is needed unless otherwise documented below in the visit note. 

## 2014-11-13 ENCOUNTER — Encounter: Payer: Self-pay | Admitting: Internal Medicine

## 2014-11-13 ENCOUNTER — Ambulatory Visit (INDEPENDENT_AMBULATORY_CARE_PROVIDER_SITE_OTHER): Payer: Medicare Other | Admitting: Internal Medicine

## 2014-11-13 VITALS — BP 114/76 | HR 76 | Temp 97.7°F | Wt 128.0 lb

## 2014-11-13 DIAGNOSIS — R05 Cough: Secondary | ICD-10-CM

## 2014-11-13 DIAGNOSIS — R059 Cough, unspecified: Secondary | ICD-10-CM

## 2014-11-13 DIAGNOSIS — R5383 Other fatigue: Secondary | ICD-10-CM

## 2014-11-13 DIAGNOSIS — R0981 Nasal congestion: Secondary | ICD-10-CM

## 2014-11-13 LAB — CBC
HCT: 37.7 % (ref 36.0–46.0)
Hemoglobin: 12.1 g/dL (ref 12.0–15.0)
MCHC: 32.2 g/dL (ref 30.0–36.0)
MCV: 90.1 fl (ref 78.0–100.0)
PLATELETS: 280 10*3/uL (ref 150.0–400.0)
RBC: 4.18 Mil/uL (ref 3.87–5.11)
RDW: 13.4 % (ref 11.5–15.5)
WBC: 10.6 10*3/uL — AB (ref 4.0–10.5)

## 2014-11-13 LAB — COMPREHENSIVE METABOLIC PANEL
ALBUMIN: 3.9 g/dL (ref 3.5–5.2)
ALK PHOS: 65 U/L (ref 39–117)
ALT: 17 U/L (ref 0–35)
AST: 16 U/L (ref 0–37)
BILIRUBIN TOTAL: 0.8 mg/dL (ref 0.2–1.2)
BUN: 12 mg/dL (ref 6–23)
CHLORIDE: 102 meq/L (ref 96–112)
CO2: 27 meq/L (ref 19–32)
Calcium: 8.5 mg/dL (ref 8.4–10.5)
Creatinine, Ser: 0.8 mg/dL (ref 0.4–1.2)
GFR: 76.6 mL/min (ref >60.00)
GLUCOSE: 90 mg/dL (ref 70–99)
POTASSIUM: 3.5 meq/L (ref 3.5–5.1)
Sodium: 138 mEq/L (ref 135–145)
TOTAL PROTEIN: 7.5 g/dL (ref 6.0–8.3)

## 2014-11-13 LAB — TSH: TSH: 1.78 u[IU]/mL (ref 0.35–4.50)

## 2014-11-13 LAB — T4, FREE: FREE T4: 0.92 ng/dL (ref 0.60–1.60)

## 2014-11-13 LAB — VITAMIN D 25 HYDROXY (VIT D DEFICIENCY, FRACTURES): VITD: 40.91 ng/mL (ref 30.00–100.00)

## 2014-11-13 NOTE — Patient Instructions (Signed)

## 2014-11-13 NOTE — Progress Notes (Signed)
Subjective:    Patient ID: Patricia Peck, female    DOB: 1946/06/25, 69 y.o.   MRN: [id number]  HPI   Ms. Cavenaugh is a 69 year old female who presents today with chief complaint of cough and nasal congestion.  Her symptoms began three days ago after she took a pill and developed a cough as soon as she took it, she felt like she couldn't get the pill down.  She then has had on and off episodes of cough.  Today, she had a nose bleed that resolved spontaneously.  She has had one episode of nausea. She feels very fatigued. She has taken Dymista spray and Advair without relief.  She does take Allegra and Xyzal for allergies. She has not had sick contacts. She is leaving next week on a [employer] cruise and wants to make sure she is okay before she goes.    Review of Systems  Constitutional: Positive for fatigue. Negative for fever, chills, activity change and appetite change.  HENT: Positive for congestion and rhinorrhea. Negative for sore throat.   Respiratory: Positive for cough. Negative for chest tightness, shortness of breath and wheezing.   Cardiovascular: Negative for chest pain.  Skin: Negative for pallor, rash and wound.  Neurological: Negative for dizziness, light-headedness and headaches.   Past Medical History  Diagnosis Date  . Allergy   . Depression   . Hypertension   . Osteoporosis   . Hypothyroidism   . Asthma   . GERD (gastroesophageal reflux disease)   . Hyperlipidemia   . TIA (transient ischemic attack)   . Arthritis     hands  . Atrial fibrillation   . Atrial flutter    Family History  Problem Relation Age of Onset  . Breast cancer Mother   . Drug abuse Son   . Depression Son   . Stroke Paternal Aunt   . Heart disease Paternal Grandfather   . Diabetes Maternal Grandfather    Current Outpatient Prescriptions on File Prior to Visit  Medication Sig Dispense Refill  . Alpha-D-Galactosidase (BEANO PO) Take by mouth 2 (two) times daily.    . Alum & Mag  Hydroxide-Simeth (MAGIC MOUTHWASH W/LIDOCAINE) SOLN Take 10 mLs by mouth 4 (four) times daily as needed. Swish and spit out 10 ml by mouth 4 times a day 240 mL 0  . atorvastatin (LIPITOR) 20 MG tablet TAKE 1 TABLET BY MOUTH DAILY 90 tablet 0  . Azelastine-Fluticasone (DYMISTA) 137-50 MCG/ACT SUSP Place 1 spray into the nose 2 (two) times daily.    . Bepotastine Besilate (BEPREVE) 1.5 % SOLN Use one drop in each eye twice a day    . bumetanide (BUMEX) 1 MG tablet TAKE 1 TABLET BY MOUTH 2 TIMES A DAY. 180 tablet 0  . buPROPion (WELLBUTRIN) 75 MG tablet TAKE 1 TABLET BY MOUTH TWICE A DAY. 60 tablet 0  . Calcium Carbonate-Vitamin D (CALCIUM 600/VITAMIN D) 600-400 MG-UNIT per tablet Take 1 tablet by mouth 2 (two) times daily.      . Cholecalciferol (VITAMIN D-3) 5000 UNITS TABS Take one tablet by mouth three times a week    . cyclobenzaprine (FLEXERIL) 10 MG tablet Take 10 mg by mouth 2 (two) times daily as needed. As needed for back pain     . diclofenac sodium (VOLTAREN) 1 % GEL Apply as needed 9 Tube 3  . famotidine (PEPCID AC) 10 MG chewable tablet Chew 10 mg by mouth daily.    . fexofenadine (ALLEGRA) 60 MG  tablet Take 1 tablet (60 mg total) by mouth 2 (two) times daily. 60 tablet 3  . fluocinonide gel (LIDEX) 0.05 % Apply topically as needed.    . Fluticasone-Salmeterol (ADVAIR DISKUS) 100-50 MCG/DOSE AEPB Inhale 1 puff into the lungs 2 (two) times daily. 180 each 1  . HYDROcodone-acetaminophen (VICODIN) 5-500 MG per tablet Take 1 tablet by mouth every 6 (six) hours as needed.      Marland Kitchen levocetirizine (XYZAL) 5 MG tablet Take 1 tablet (5 mg total) by mouth every evening. 90 tablet 3  . levothyroxine (SYNTHROID, LEVOTHROID) 50 MCG tablet TAKE 1 TABLET BY MOUTH DAILY. 90 tablet 0  . Melatonin 5 MG TABS Take 3 tablets my mouth at bedtime    . metoprolol succinate (TOPROL-XL) 25 MG 24 hr tablet Take 1 tablet (25 mg total) by mouth daily. 90 tablet 3  . NEXIUM 40 MG capsule TAKE 1 CAPSULE (40 MG TOTAL)  BY MOUTH DAILY. 90 capsule 1  . nystatin (MYCOSTATIN) 100000 UNIT/ML suspension SWISH BY MOUTH 10 ML UP TO 4 TIMES A DAY 240 mL 1  . potassium chloride (K-DUR) 10 MEQ tablet TAKE 1 TABLET BY MOUTH 2 TIMES A DAY. 180 tablet 0  . traZODone (DESYREL) 50 MG tablet TAKE 3 TABLETS BY MOUTH AT BEDTIME. 90 tablet 0  . XARELTO 20 MG TABS tablet TAKE 1 TABLET BY MOUTH DAILY. 90 tablet 2   No current facility-administered medications on file prior to visit.       Objective:   Physical Exam  Constitutional: She is oriented to person, place, and time. She appears well-developed and well-nourished.  HENT:  Head: Normocephalic and atraumatic.  Right Ear: External ear normal.  Left Ear: External ear normal.  Nose: Nose normal.  Mouth/Throat: Oropharynx is clear and moist. No oropharyngeal exudate.  Neck: Normal range of motion. Neck supple.  Cardiovascular: Normal rate and normal heart sounds.   Pulmonary/Chest: Effort normal and breath sounds normal.  Lymphadenopathy:    She has no cervical adenopathy.  Neurological: She is alert and oriented to person, place, and time.  Skin: Skin is warm and dry.  BP 114/76 mmHg  Pulse 76  Temp(Src) 97.7 F (36.5 C) (Oral)  Wt 128 lb (58.06 kg)  SpO2 98%         Assessment & Plan:  Nasal congestion, cough and fatigue:   Advised patient to use Dymista nasal spray once a day instead of twice to reduce dryness and may use saline nasal spray for moisture.  Physical exam was benign, but will check CBC, CMET, TSH, Free T3, Free T4 and Vitamin D level to evaluate fatigue.   Advised patient to get plenty of rest and drink fluids.    Will follow up after labs, RTC as needed or if symptoms persist or worsen

## 2014-11-13 NOTE — Progress Notes (Signed)
Pre visit review using our clinic review tool, if applicable. No additional management support is needed unless otherwise documented below in the visit note. 

## 2014-11-14 ENCOUNTER — Telehealth: Payer: Self-pay | Admitting: Internal Medicine

## 2014-11-14 ENCOUNTER — Other Ambulatory Visit: Payer: Self-pay | Admitting: Internal Medicine

## 2014-11-14 ENCOUNTER — Ambulatory Visit (INDEPENDENT_AMBULATORY_CARE_PROVIDER_SITE_OTHER)
Admission: RE | Admit: 2014-11-14 | Discharge: 2014-11-14 | Disposition: A | Discharge status: Home / Self Care | Payer: Medicare Other | Source: Ambulatory Visit | Attending: Internal Medicine | Admitting: Internal Medicine

## 2014-11-14 DIAGNOSIS — R05 Cough: Secondary | ICD-10-CM

## 2014-11-14 DIAGNOSIS — R059 Cough, unspecified: Secondary | ICD-10-CM

## 2014-11-14 DIAGNOSIS — R5383 Other fatigue: Secondary | ICD-10-CM

## 2014-11-14 MED ORDER — LEVOFLOXACIN 500 MG PO TABS: 500.0000 mg | ORAL_TABLET | Freq: Every day | ORAL | Status: DC

## 2014-11-14 NOTE — Telephone Encounter (Signed)
Patient returned your call.

## 2014-11-14 NOTE — Addendum Note (Signed)
Addended by: Jearld Fenton on: 11/14/2014 08:08 AM   Modules accepted: Orders

## 2014-11-14 NOTE — Telephone Encounter (Signed)
Spoke with patient and advised results   

## 2014-11-16 ENCOUNTER — Other Ambulatory Visit: Payer: Self-pay | Admitting: Radiology

## 2014-11-16 DIAGNOSIS — I6523 Occlusion and stenosis of bilateral carotid arteries: Secondary | ICD-10-CM

## 2014-11-28 ENCOUNTER — Other Ambulatory Visit: Payer: Self-pay | Admitting: *Deleted

## 2014-11-28 NOTE — Telephone Encounter (Signed)
Received refill request from pharmacy. Per pharmacy fax pt is requesting a 3 month supply #[phone]. #90 last filled on 10/23/14

## 2014-11-29 MED ORDER — TRAZODONE HCL 50 MG PO TABS: ORAL_TABLET | ORAL | Status: DC

## 2014-12-06 ENCOUNTER — Ambulatory Visit (HOSPITAL_COMMUNITY): Payer: Medicare Other | Attending: Internal Medicine | Admitting: Cardiology

## 2014-12-06 DIAGNOSIS — G458 Other transient cerebral ischemic attacks and related syndromes: Secondary | ICD-10-CM

## 2014-12-06 DIAGNOSIS — I6523 Occlusion and stenosis of bilateral carotid arteries: Secondary | ICD-10-CM | POA: Diagnosis not present

## 2014-12-06 NOTE — Progress Notes (Signed)
Carotid duplex performed 

## 2014-12-12 ENCOUNTER — Encounter: Payer: Self-pay | Admitting: Cardiology

## 2014-12-12 ENCOUNTER — Telehealth: Payer: Self-pay | Admitting: Family Medicine

## 2014-12-12 ENCOUNTER — Ambulatory Visit: Payer: Medicare Other

## 2014-12-12 NOTE — Telephone Encounter (Signed)
Pt would like her doppler results from 12-06-14 please.

## 2014-12-12 NOTE — Telephone Encounter (Signed)
This encounter was created in error - please disregard.

## 2014-12-12 NOTE — Telephone Encounter (Signed)
Patient had Carotid Doppler done on 12/06/14.  Patient hasn't received results of the test. When patient was at the Cardiology on [facility] the person doing the readings told patient that patient should have blood pressure taken in her right arm to get a more accurate reading.  Patient wants to know if she should be checking her blood pressure more frequently and how can she take it in her right arm since the pharmacies blood pressure cuffs only go on left arm.  Please advise.

## 2014-12-12 NOTE — Telephone Encounter (Signed)
Thanks. After pts response is received, pt will be contacted to have Calcium lab completed

## 2014-12-12 NOTE — Telephone Encounter (Signed)
I am sorry.  I thought she had received the results.  Her plaques on both sides were stable and Dr. Fletcher Anon, the cardiologist who read the report, suggested that she repeat it in 2 years.

## 2014-12-12 NOTE — Telephone Encounter (Signed)
Pt came in for an apptmt for Prolia injection, which was scheduled in August, 2015, however, no insurance verification had been ran. I have electronically sent pt's info for [place] verification and will notify you once I have a response. Since pt was on Rena's nurse visit, she is going to r/s pt. Thank you.

## 2014-12-12 NOTE — Telephone Encounter (Signed)
In terms of her BP, she can check it on either side.  Ok to alternate to see if there is a difference.

## 2014-12-13 ENCOUNTER — Telehealth: Payer: Self-pay | Admitting: Family Medicine

## 2014-12-13 NOTE — Telephone Encounter (Signed)
Pt returned your call. Please call back when you can.  Thank you.

## 2014-12-13 NOTE — Telephone Encounter (Signed)
Lm on pts vm requesting a call back 

## 2014-12-14 NOTE — Telephone Encounter (Signed)
Spoke to pt and informed her of results and instruction. Pt verbally expressed understanding.

## 2014-12-20 ENCOUNTER — Other Ambulatory Visit: Payer: Self-pay | Admitting: Family Medicine

## 2014-12-20 DIAGNOSIS — M810 Age-related osteoporosis without current pathological fracture: Secondary | ICD-10-CM

## 2014-12-20 NOTE — Telephone Encounter (Signed)
Spoke to pt and advised. Pt verbally expressed understanding. Pt states that she has previously had this injection at no cost to her, but I did advise her that that does not mean she will not have a an amount that she is responsible for this time after seeing what her insurances will pay. Calcium lab scheduled.

## 2014-12-20 NOTE — Telephone Encounter (Signed)
I have rec'd pt's Prolia insurance verification. Her primary insurance, [employer] MCR would have a $50 co-pay for the Prolia plus a $20 co-pay for the admin, however, her secondary insurance, [employer] does not follow MCR guidelines and will consider the [facility] Part B deductible and co-insurance up to plans allowable, which means pt will have an estimated responsibility of $0.  Please advise pt this is an estimate and we will not know an exact amt until both of her insurances have paid.  I have sent a copy of the summary of benefits to be scanned into pt's chart.  Please let me know if you have any questions. Thank you.

## 2014-12-21 ENCOUNTER — Other Ambulatory Visit (INDEPENDENT_AMBULATORY_CARE_PROVIDER_SITE_OTHER): Payer: Medicare Other

## 2014-12-21 DIAGNOSIS — M810 Age-related osteoporosis without current pathological fracture: Secondary | ICD-10-CM

## 2014-12-21 LAB — CALCIUM: Calcium: 9.6 mg/dL (ref 8.4–10.5)

## 2014-12-26 ENCOUNTER — Telehealth: Payer: Self-pay

## 2014-12-26 NOTE — Telephone Encounter (Signed)
Pt left v/m; pt tried to pick up nexium rx at [facility] drug and pt was advised nexium requires prior authorization; pt wants to know if PA has been done? Pt request cb.

## 2014-12-27 ENCOUNTER — Ambulatory Visit (INDEPENDENT_AMBULATORY_CARE_PROVIDER_SITE_OTHER): Payer: Medicare Other

## 2014-12-27 ENCOUNTER — Other Ambulatory Visit: Payer: Self-pay | Admitting: Family Medicine

## 2014-12-27 DIAGNOSIS — M810 Age-related osteoporosis without current pathological fracture: Secondary | ICD-10-CM

## 2014-12-27 DIAGNOSIS — R7989 Other specified abnormal findings of blood chemistry: Secondary | ICD-10-CM

## 2014-12-27 MED ORDER — DENOSUMAB 60 MG/ML ~~LOC~~ SOLN
60.0000 mg | Freq: Once | SUBCUTANEOUS | Status: AC
  Administered 2014-12-27: 60 mg via SUBCUTANEOUS

## 2014-12-27 NOTE — Telephone Encounter (Signed)
PA form received and completed. Will contact pt upon response

## 2014-12-27 NOTE — Telephone Encounter (Signed)
Spoke to pt and informed her of non coverage. Advised pt of alternate options; Nexium OTC, Prilosec OTC, or a prescription for omeprazole or pantoprazole (generic only). She states that she will "think it over" and contact office with decision. In the meantime, she may pickup Rx at pharmacy and pay out of pocket

## 2014-12-27 NOTE — Telephone Encounter (Signed)
Pt is in office for prolia injection and pt request status of prior auth for nexium to [employer]; pt states she was told that ins will pay for med if prior [name] is done. Pt request cb.

## 2015-01-03 ENCOUNTER — Other Ambulatory Visit (INDEPENDENT_AMBULATORY_CARE_PROVIDER_SITE_OTHER): Payer: Medicare Other

## 2015-01-03 DIAGNOSIS — R7989 Other specified abnormal findings of blood chemistry: Secondary | ICD-10-CM

## 2015-01-03 LAB — CBC WITH DIFFERENTIAL/PLATELET
BASOS PCT: 0.8 % (ref 0.0–3.0)
Basophils Absolute: 0 10*3/uL (ref 0.0–0.1)
EOS ABS: 0.3 10*3/uL (ref 0.0–0.7)
EOS PCT: 4.8 % (ref 0.0–5.0)
HCT: 36.4 % (ref 36.0–46.0)
HEMOGLOBIN: 12.5 g/dL (ref 12.0–15.0)
LYMPHS PCT: 33.5 % (ref 12.0–46.0)
Lymphs Abs: 2.1 10*3/uL (ref 0.7–4.0)
MCHC: 34.2 g/dL (ref 30.0–36.0)
MCV: 86.8 fl (ref 78.0–100.0)
Monocytes Absolute: 0.8 10*3/uL (ref 0.1–1.0)
Monocytes Relative: 12.4 % — ABNORMAL HIGH (ref 3.0–12.0)
NEUTROS ABS: 3 10*3/uL (ref 1.4–7.7)
Neutrophils Relative %: 48.5 % (ref 43.0–77.0)
Platelets: 287 10*3/uL (ref 150.0–400.0)
RBC: 4.19 Mil/uL (ref 3.87–5.11)
RDW: 13.3 % (ref 11.5–15.5)
WBC: 6.2 10*3/uL (ref 4.0–10.5)

## 2015-01-03 NOTE — Telephone Encounter (Signed)
Fax received indicating approval for pts Nexium through 12/29/2015. Copy faxed to pharmacy

## 2015-01-04 ENCOUNTER — Encounter: Payer: Self-pay | Admitting: Family Medicine

## 2015-01-04 ENCOUNTER — Ambulatory Visit (INDEPENDENT_AMBULATORY_CARE_PROVIDER_SITE_OTHER): Payer: Medicare Other | Admitting: Family Medicine

## 2015-01-04 VITALS — BP 130/82 | HR 56 | Temp 97.4°F | Wt 133.5 lb

## 2015-01-04 DIAGNOSIS — G4700 Insomnia, unspecified: Secondary | ICD-10-CM

## 2015-01-04 DIAGNOSIS — E039 Hypothyroidism, unspecified: Secondary | ICD-10-CM

## 2015-01-04 DIAGNOSIS — I480 Paroxysmal atrial fibrillation: Secondary | ICD-10-CM

## 2015-01-04 MED ORDER — ZOLPIDEM TARTRATE 5 MG PO TABS: 5.0000 mg | ORAL_TABLET | Freq: Every evening | ORAL | Status: DC | PRN

## 2015-01-04 NOTE — Progress Notes (Signed)
Pre visit review using our clinic review tool, if applicable. No additional management support is needed unless otherwise documented below in the visit note. 

## 2015-01-04 NOTE — Patient Instructions (Signed)
Good to see you.  We are starting ambien 5 mg nightly as needed for insomnia.  Please keep me updated.

## 2015-01-04 NOTE — Assessment & Plan Note (Signed)
>  25 min spent with patient, at least half of which was spent on counseling insomnia.  The problem of recurrent insomnia is discussed. Avoidance of caffeine sources is strongly encouraged. Sleep hygiene issues are reviewed. The use of sedative hypnotics for temporary relief is appropriate; we discussed the addictive nature of these drugs, the safety concerns of these drugs in someone her age, and a one-time only prescription for prn use of a hypnotic is given.  Advised to use sparingly. Call or return to clinic prn if these symptoms worsen or fail to improve as anticipated. The patient indicates understanding of these issues and agrees with the plan.

## 2015-01-04 NOTE — Progress Notes (Signed)
Subjective:   Patient ID: Patricia Peck, female    DOB: 26-Jan-1946, 69 y.o.   MRN: [id number]  Patricia Peck is a pleasant 69 y.o. year old female who presents to clinic today with her husband for  Follow-up  on 01/04/2015  HPI:  Insomnia- persistent and troubling issue for her. Currently taking Melatonin and trazodone 150 mg qhs.  Wakes up most nights at 3 am and cannot go back to sleep.  Her sister died at 55 and her family thinks since autopsy was negative, that it was due to her chronic insomnia. Does not feel depressed or anxious.  They are going on another cruise later this month.  Husband's health has been better lately.  Tells me that the best sleep she has had recently was when she had pneumonia.  Lab Results  Component Value Date   TSH 1.78 11/13/2014   Lab Results  Component Value Date   WBC 6.2 01/03/2015   HGB 12.5 01/03/2015   HCT 36.4 01/03/2015   MCV 86.8 01/03/2015   PLT 287.0 01/03/2015   Current Outpatient Prescriptions on File Prior to Visit  Medication Sig Dispense Refill  . Alpha-D-Galactosidase (BEANO PO) Take by mouth 2 (two) times daily.    . Alum & Mag Hydroxide-Simeth (MAGIC MOUTHWASH W/LIDOCAINE) SOLN Take 10 mLs by mouth 4 (four) times daily as needed. Swish and spit out 10 ml by mouth 4 times a day 240 mL 0  . atorvastatin (LIPITOR) 20 MG tablet TAKE 1 TABLET BY MOUTH DAILY 90 tablet 0  . Azelastine-Fluticasone (DYMISTA) 137-50 MCG/ACT SUSP Place 1 spray into the nose 2 (two) times daily.    . Bepotastine Besilate (BEPREVE) 1.5 % SOLN Use one drop in each eye twice a day    . bumetanide (BUMEX) 1 MG tablet TAKE 1 TABLET BY MOUTH 2 TIMES A DAY. 180 tablet 0  . buPROPion (WELLBUTRIN) 75 MG tablet TAKE 1 TABLET BY MOUTH TWICE A DAY. 60 tablet 0  . Calcium Carbonate-Vitamin D (CALCIUM 600/VITAMIN D) 600-400 MG-UNIT per tablet Take 1 tablet by mouth 2 (two) times daily.      . Cholecalciferol (VITAMIN D-3) 5000 UNITS TABS Take one tablet by mouth three  times a week    . cyclobenzaprine (FLEXERIL) 10 MG tablet Take 10 mg by mouth 2 (two) times daily as needed. As needed for back pain     . diclofenac sodium (VOLTAREN) 1 % GEL Apply as needed 9 Tube 3  . famotidine (PEPCID AC) 10 MG chewable tablet Chew 10 mg by mouth daily.    . fexofenadine (ALLEGRA) 60 MG tablet Take 1 tablet (60 mg total) by mouth 2 (two) times daily. 60 tablet 3  . fluocinonide gel (LIDEX) 0.05 % Apply topically as needed.    . Fluticasone-Salmeterol (ADVAIR DISKUS) 100-50 MCG/DOSE AEPB Inhale 1 puff into the lungs 2 (two) times daily. 180 each 1  . HYDROcodone-acetaminophen (VICODIN) 5-500 MG per tablet Take 1 tablet by mouth every 6 (six) hours as needed.      Marland Kitchen levocetirizine (XYZAL) 5 MG tablet Take 1 tablet (5 mg total) by mouth every evening. 90 tablet 3  . levofloxacin (LEVAQUIN) 500 MG tablet Take 1 tablet (500 mg total) by mouth daily. 10 tablet 0  . levothyroxine (SYNTHROID, LEVOTHROID) 50 MCG tablet TAKE 1 TABLET BY MOUTH DAILY. 90 tablet 0  . Melatonin 5 MG TABS Take 3 tablets my mouth at bedtime    . metoprolol succinate (TOPROL-XL) 25 MG  24 hr tablet Take 1 tablet (25 mg total) by mouth daily. 90 tablet 3  . NEXIUM 40 MG capsule TAKE 1 CAPSULE (40 MG TOTAL) BY MOUTH DAILY. 90 capsule 1  . nystatin (MYCOSTATIN) 100000 UNIT/ML suspension SWISH BY MOUTH 10 ML UP TO 4 TIMES A DAY 240 mL 1  . potassium chloride (K-DUR) 10 MEQ tablet TAKE 1 TABLET BY MOUTH 2 TIMES A DAY. 180 tablet 0  . traZODone (DESYREL) 50 MG tablet TAKE 3 TABLETS BY MOUTH AT BEDTIME. 90 tablet 0  . XARELTO 20 MG TABS tablet TAKE 1 TABLET BY MOUTH DAILY. 90 tablet 2   No current facility-administered medications on file prior to visit.    No Known Allergies  Past Medical History  Diagnosis Date  . Allergy   . Depression   . Hypertension   . Osteoporosis   . Hypothyroidism   . Asthma   . GERD (gastroesophageal reflux disease)   . Hyperlipidemia   . TIA (transient ischemic attack)   .  Arthritis     hands  . Atrial fibrillation   . Atrial flutter     Past Surgical History  Procedure Laterality Date  . Partial hysterectomy  1989  . Colonoscopy    . Tee without cardioversion N/A 12/22/2012    Procedure: TRANSESOPHAGEAL ECHOCARDIOGRAM (TEE);  Surgeon: Lelon Perla, MD;  Location: [facility] ENDOSCOPY;  Service: Cardiovascular;  Laterality: N/A;    Family History  Problem Relation Age of Onset  . Breast cancer Mother   . Drug abuse Son   . Depression Son   . Stroke Paternal Aunt   . Heart disease Paternal Grandfather   . Diabetes Maternal Grandfather     History   Social History  . Marital Status: Married    Spouse Name: N/A  . Number of Children: 2  . Years of Education: N/A   Occupational History  .     Social History Main Topics  . Smoking status: Never Smoker   . Smokeless tobacco: Never Used  . Alcohol Use: Yes     Comment: Rare  . Drug Use: No  . Sexual Activity: Not on file   Other Topics Concern  . Not on file   Social History Narrative   Does have HPOA- sons Eddie Dibbles and Wille Glaser.   Does desire life support if not futile.   Not sure about feeding tubes.   The PMH, PSH, Social History, Family History, Medications, and allergies have been reviewed in [facility], and have been updated if relevant.   Review of Systems  Constitutional: Positive for fatigue.  HENT: Negative.   Eyes: Negative.   Respiratory: Negative.   Cardiovascular: Negative.   Endocrine: Negative.   Genitourinary: Negative.   Musculoskeletal: Negative.   Skin: Negative.   Neurological: Negative.   Psychiatric/Behavioral: Positive for sleep disturbance. Negative for dysphoric mood and decreased concentration. The patient is not nervous/anxious and is not hyperactive.   All other systems reviewed and are negative.      Objective:    BP 130/82 mmHg  Pulse 56  Temp(Src) 97.4 F (36.3 C) (Oral)  Wt 133 lb 8 oz (60.555 kg)  SpO2 99%   Physical Exam  Constitutional: She is  oriented to person, place, and time. She appears well-developed and well-nourished. No distress.  HENT:  Head: Normocephalic.  Eyes: Conjunctivae are normal.  Neck: Normal range of motion.  Cardiovascular: Normal rate.   Pulmonary/Chest: Effort normal.  Musculoskeletal: Normal range of motion.  Neurological: She  is alert and oriented to person, place, and time. No cranial nerve deficit.  Skin: Skin is warm and dry.  Psychiatric: She has a normal mood and affect. Her behavior is normal. Judgment and thought content normal.  Nursing note and vitals reviewed.         Assessment & Plan:   Insomnia  Paroxysmal atrial fibrillation  Hypothyroidism, unspecified hypothyroidism type No Follow-up on file.

## 2015-01-12 ENCOUNTER — Other Ambulatory Visit: Payer: Self-pay | Admitting: Family Medicine

## 2015-01-15 ENCOUNTER — Other Ambulatory Visit: Payer: Self-pay | Admitting: Family Medicine

## 2015-01-15 ENCOUNTER — Telehealth: Payer: Self-pay | Admitting: Family Medicine

## 2015-01-15 MED ORDER — TRAZODONE HCL 50 MG PO TABS: ORAL_TABLET | ORAL | Status: DC

## 2015-01-15 NOTE — Telephone Encounter (Signed)
Pt called to ck on K refill;advised already done and pt will ck with pharmacy later today.

## 2015-01-15 NOTE — Telephone Encounter (Signed)
Pt left /vm and requesting 3 month rx for trazodone to [facility]. Pt request cb. Refill was done today for one month refill.Please advise.

## 2015-01-15 NOTE — Addendum Note (Signed)
Addended by: Modena Nunnery on: 01/15/2015 04:21 PM   Modules accepted: Orders

## 2015-01-15 NOTE — Telephone Encounter (Signed)
New Rx sent.

## 2015-02-22 ENCOUNTER — Encounter: Payer: Self-pay | Admitting: Family Medicine

## 2015-02-22 ENCOUNTER — Ambulatory Visit (INDEPENDENT_AMBULATORY_CARE_PROVIDER_SITE_OTHER): Payer: Medicare Other | Admitting: Family Medicine

## 2015-02-22 VITALS — BP 136/82 | HR 76 | Temp 98.1°F | Wt 129.2 lb

## 2015-02-22 DIAGNOSIS — J0110 Acute frontal sinusitis, unspecified: Secondary | ICD-10-CM | POA: Diagnosis not present

## 2015-02-22 MED ORDER — LEVOFLOXACIN 500 MG PO TABS: 500.0000 mg | ORAL_TABLET | Freq: Every day | ORAL | Status: DC

## 2015-02-22 NOTE — Progress Notes (Signed)
SUBJECTIVE:  Patricia Peck is a 69 y.o. female who complains of coryza, congestion and bilateral sinus pain for 8 days. She denies a history of chest pain, chills, dizziness and fevers and denies a history of asthma. Patient denies smoke cigarettes.   Takes Allegra and Zyzal. Uses inhaled and nasal steroid daily.   Current Outpatient Prescriptions on File Prior to Visit  Medication Sig Dispense Refill  . Alpha-D-Galactosidase (BEANO PO) Take by mouth 2 (two) times daily.    . Alum & Mag Hydroxide-Simeth (MAGIC MOUTHWASH W/LIDOCAINE) SOLN Take 10 mLs by mouth 4 (four) times daily as needed. Swish and spit out 10 ml by mouth 4 times a day 240 mL 0  . atorvastatin (LIPITOR) 20 MG tablet TAKE 1 TABLET BY MOUTH DAILY 90 tablet 0  . Azelastine-Fluticasone (DYMISTA) 137-50 MCG/ACT SUSP Place 1 spray into the nose 2 (two) times daily.    . Bepotastine Besilate (BEPREVE) 1.5 % SOLN Use one drop in each eye twice a day    . bumetanide (BUMEX) 1 MG tablet TAKE 1 TABLET BY MOUTH 2 TIMES A DAY. 180 tablet 0  . buPROPion (WELLBUTRIN) 75 MG tablet TAKE 1 TABLET BY MOUTH TWICE A DAY. 180 tablet 0  . Calcium Carbonate-Vitamin D (CALCIUM 600/VITAMIN D) 600-400 MG-UNIT per tablet Take 1 tablet by mouth 2 (two) times daily.      . Cholecalciferol (VITAMIN D-3) 5000 UNITS TABS Take one tablet by mouth three times a week    . cyclobenzaprine (FLEXERIL) 10 MG tablet Take 10 mg by mouth 2 (two) times daily as needed. As needed for back pain     . diclofenac sodium (VOLTAREN) 1 % GEL Apply as needed 9 Tube 3  . famotidine (PEPCID AC) 10 MG chewable tablet Chew 10 mg by mouth daily.    . fexofenadine (ALLEGRA) 60 MG tablet Take 1 tablet (60 mg total) by mouth 2 (two) times daily. 60 tablet 3  . fluocinonide gel (LIDEX) 0.05 % Apply topically as needed.    . Fluticasone-Salmeterol (ADVAIR DISKUS) 100-50 MCG/DOSE AEPB Inhale 1 puff into the lungs 2 (two) times daily. 180 each 1  . HYDROcodone-acetaminophen (VICODIN)  5-500 MG per tablet Take 1 tablet by mouth every 6 (six) hours as needed.      Marland Kitchen levocetirizine (XYZAL) 5 MG tablet Take 1 tablet (5 mg total) by mouth every evening. 90 tablet 3  . levofloxacin (LEVAQUIN) 500 MG tablet Take 1 tablet (500 mg total) by mouth daily. 10 tablet 0  . levothyroxine (SYNTHROID, LEVOTHROID) 50 MCG tablet TAKE 1 TABLET BY MOUTH DAILY. 90 tablet 3  . Melatonin 5 MG TABS Take 3 tablets my mouth at bedtime    . metoprolol succinate (TOPROL-XL) 25 MG 24 hr tablet Take 1 tablet (25 mg total) by mouth daily. 90 tablet 3  . NEXIUM 40 MG capsule TAKE 1 CAPSULE (40 MG TOTAL) BY MOUTH DAILY. 90 capsule 1  . nystatin (MYCOSTATIN) 100000 UNIT/ML suspension SWISH BY MOUTH 10 ML UP TO 4 TIMES A DAY 240 mL 1  . potassium chloride (K-DUR) 10 MEQ tablet TAKE 1 TABLET BY MOUTH 2 TIMES A DAY. 180 tablet 0  . traZODone (DESYREL) 50 MG tablet TAKE 3 TABLETS BY MOUTH AT BEDTIME. 270 tablet 0  . XARELTO 20 MG TABS tablet TAKE 1 TABLET BY MOUTH DAILY. 90 tablet 2  . zolpidem (AMBIEN) 5 MG tablet Take 1 tablet (5 mg total) by mouth at bedtime as needed for sleep. 30 tablet  0   No current facility-administered medications on file prior to visit.    No Known Allergies  Past Medical History  Diagnosis Date  . Allergy   . Depression   . Hypertension   . Osteoporosis   . Hypothyroidism   . Asthma   . GERD (gastroesophageal reflux disease)   . Hyperlipidemia   . TIA (transient ischemic attack)   . Arthritis     hands  . Atrial fibrillation   . Atrial flutter     Past Surgical History  Procedure Laterality Date  . Partial hysterectomy  1989  . Colonoscopy    . Tee without cardioversion N/A 12/22/2012    Procedure: TRANSESOPHAGEAL ECHOCARDIOGRAM (TEE);  Surgeon: Lelon Perla, MD;  Location: [facility] ENDOSCOPY;  Service: Cardiovascular;  Laterality: N/A;    Family History  Problem Relation Age of Onset  . Breast cancer Mother   . Drug abuse Son   . Depression Son   . Stroke  Paternal Aunt   . Heart disease Paternal Grandfather   . Diabetes Maternal Grandfather     History   Social History  . Marital Status: Married    Spouse Name: N/A  . Number of Children: 2  . Years of Education: N/A   Occupational History  .     Social History Main Topics  . Smoking status: Never Smoker   . Smokeless tobacco: Never Used  . Alcohol Use: Yes     Comment: Rare  . Drug Use: No  . Sexual Activity: Not on file   Other Topics Concern  . Not on file   Social History Narrative   Does have HPOA- sons Eddie Dibbles and Wille Glaser.   Does desire life support if not futile.   Not sure about feeding tubes.   The PMH, PSH, Social History, Family History, Medications, and allergies have been reviewed in [facility], and have been updated if relevant.  OBJECTIVE: BP 136/82 mmHg  Pulse 76  Temp(Src) 98.1 F (36.7 C) (Oral)  Wt 129 lb 4 oz (58.627 kg)  SpO2 97%  She appears well, vital signs are as noted. Ears normal.  Throat and pharynx normal.  Neck supple. No adenopathy in the neck. Nose is congested. Sinuses tender. The chest is clear, without wheezes or rales.  ASSESSMENT:  sinusitis  PLAN: Given duration and progression of symptoms, will treat for bacterial sinusitis. Levaquin 500 mg daily x 7 days. Symptomatic therapy suggested: push fluids, rest and return office visit prn if symptoms persist or worsen.Call or return to clinic prn if these symptoms worsen or fail to improve as anticipated.

## 2015-02-22 NOTE — Progress Notes (Signed)
Pre visit review using our clinic review tool, if applicable. No additional management support is needed unless otherwise documented below in the visit note. 

## 2015-03-13 ENCOUNTER — Encounter: Payer: Self-pay | Admitting: Family Medicine

## 2015-03-14 ENCOUNTER — Telehealth: Payer: Self-pay | Admitting: Family Medicine

## 2015-03-14 NOTE — Telephone Encounter (Signed)
Pt called stating she received her mammogram results in the mail She would like a call so she can discuss this

## 2015-03-14 NOTE — Telephone Encounter (Signed)
Spoke to pt and informed her per Dr Deborra Medina, results are normal. Pt states that the "wording was different than her last letter."

## 2015-04-05 ENCOUNTER — Ambulatory Visit (INDEPENDENT_AMBULATORY_CARE_PROVIDER_SITE_OTHER): Payer: Medicare Other | Admitting: Internal Medicine

## 2015-04-05 ENCOUNTER — Encounter: Payer: Self-pay | Admitting: Internal Medicine

## 2015-04-05 VITALS — BP 130/78 | HR 70 | Temp 97.5°F | Wt 131.0 lb

## 2015-04-05 DIAGNOSIS — J309 Allergic rhinitis, unspecified: Secondary | ICD-10-CM | POA: Diagnosis not present

## 2015-04-05 NOTE — Patient Instructions (Signed)

## 2015-04-05 NOTE — Progress Notes (Signed)
HPI  Pt presents to the clinic today with c/o cough. This started 4-5 days ago after being outside all day playing golf. The cough is mostly nonproductive. When she does get something up, it is clear/white mucous. She has had some nasal congestion, ear fullness, and post nasal drip. She has some chest discomfort with coughing and taking deep breaths. She denies fever, chills or body aches. She does have a history of allergies and asthma. She has been using her albuterol inhaler, Dymista, Allegra and Xyzal. She has not had sick contacts.  Review of Systems      Past Medical History  Diagnosis Date  . Allergy   . Depression   . Hypertension   . Osteoporosis   . Hypothyroidism   . Asthma   . GERD (gastroesophageal reflux disease)   . Hyperlipidemia   . TIA (transient ischemic attack)   . Arthritis     hands  . Atrial fibrillation   . Atrial flutter     Family History  Problem Relation Age of Onset  . Breast cancer Mother   . Drug abuse Son   . Depression Son   . Stroke Paternal Aunt   . Heart disease Paternal Grandfather   . Diabetes Maternal Grandfather     History   Social History  . Marital Status: Married    Spouse Name: N/A  . Number of Children: 2  . Years of Education: N/A   Occupational History  .     Social History Main Topics  . Smoking status: Never Smoker   . Smokeless tobacco: Never Used  . Alcohol Use: Yes     Comment: Rare  . Drug Use: No  . Sexual Activity: Not on file   Other Topics Concern  . Not on file   Social History Narrative   Does have HPOA- sons Eddie Dibbles and Wille Glaser.   Does desire life support if not futile.   Not sure about feeding tubes.    No Known Allergies   Constitutional: Denies headache, fatigue, fever or abrupt weight changes.  HEENT:  Positive nasal congestion, sore throat. Denies eye redness, eye pain, pressure behind the eyes, facial pain, nasal congestion, ear pain, ringing in the ears, wax buildup, runny nose or bloody  nose. Respiratory: Positive cough. Denies difficulty breathing or shortness of breath.  Cardiovascular: Denies chest pain, chest tightness, palpitations or swelling in the hands or feet.   No other specific complaints in a complete review of systems (except as listed in HPI above).  Objective:  BP 130/78 mmHg  Pulse 70  Temp(Src) 97.5 F (36.4 C) (Oral)  Wt 131 lb (59.421 kg)  SpO2 98%  Wt Readings from Last 3 Encounters:  04/05/15 131 lb (59.421 kg)  02/22/15 129 lb 4 oz (58.627 kg)  01/04/15 133 lb 8 oz (60.555 kg)     General: Appears her stated age, well developed, well nourished in NAD. HEENT: Head: normal shape and size, no sinus tenderness noted; Eyes: sclera white, no icterus, conjunctiva pinkt; Ears: Tm's gray and intact, normal light reflex; Nose: mucosa boggy and moist, septum midline; Throat/Mouth: + PND. Teeth present, mucosa pink and moist, no exudate noted, no lesions or ulcerations noted.  Neck: No lymphadenopathy.  Cardiovascular: Normal rate and rhythm. S1,S2 noted.  No murmur, rubs or gallops noted.  Pulmonary/Chest: Normal effort and positive vesicular breath sounds. No respiratory distress. No wheezes, rales or ronchi noted.      Assessment & Plan:   Allergic Rhinitis:  Get some rest and drink plenty of water Do salt water gargles for the sore throat Continue Allegra and Xyzal Switch Dymista to Flonase 80 mg Depo IM today (No interaction between this an Depo Medrol and Xarelto per drug interaction checker) Watch for fever, facial pressure or colored nasal mucous  RTC as needed or if symptoms persist or worsen.

## 2015-04-05 NOTE — Progress Notes (Signed)
Pre visit review using our clinic review tool, if applicable. No additional management support is needed unless otherwise documented below in the visit note. 

## 2015-04-16 ENCOUNTER — Other Ambulatory Visit: Payer: Self-pay | Admitting: Family Medicine

## 2015-04-16 ENCOUNTER — Other Ambulatory Visit: Payer: Self-pay | Admitting: Cardiology

## 2015-04-16 ENCOUNTER — Telehealth: Payer: Self-pay | Admitting: *Deleted

## 2015-04-16 NOTE — Telephone Encounter (Signed)
[place] for surgery, if necessary, xarelto can be dced 2 days prior to procedure and resume day after Patricia Peck

## 2015-04-16 NOTE — Telephone Encounter (Signed)
Patient is needing clearance for cataract extraction w/intraocular lens implantation of the left eye followed by the right eye. Will forward for dr Stanford Breed review

## 2015-04-16 NOTE — Telephone Encounter (Signed)
Note sent to the number provided.

## 2015-04-17 ENCOUNTER — Ambulatory Visit (INDEPENDENT_AMBULATORY_CARE_PROVIDER_SITE_OTHER): Payer: Medicare Other | Admitting: Family Medicine

## 2015-04-17 ENCOUNTER — Other Ambulatory Visit: Payer: Self-pay | Admitting: Family Medicine

## 2015-04-17 ENCOUNTER — Encounter: Payer: Self-pay | Admitting: Family Medicine

## 2015-04-17 VITALS — BP 138/72 | HR 53 | Temp 97.4°F | Ht 63.25 in | Wt 127.8 lb

## 2015-04-17 DIAGNOSIS — F32A Depression, unspecified: Secondary | ICD-10-CM

## 2015-04-17 DIAGNOSIS — I480 Paroxysmal atrial fibrillation: Secondary | ICD-10-CM

## 2015-04-17 DIAGNOSIS — E038 Other specified hypothyroidism: Secondary | ICD-10-CM

## 2015-04-17 DIAGNOSIS — I10 Essential (primary) hypertension: Secondary | ICD-10-CM | POA: Diagnosis not present

## 2015-04-17 DIAGNOSIS — I679 Cerebrovascular disease, unspecified: Secondary | ICD-10-CM

## 2015-04-17 DIAGNOSIS — F329 Major depressive disorder, single episode, unspecified: Secondary | ICD-10-CM

## 2015-04-17 DIAGNOSIS — Z0000 Encounter for general adult medical examination without abnormal findings: Secondary | ICD-10-CM | POA: Diagnosis not present

## 2015-04-17 DIAGNOSIS — Z23 Encounter for immunization: Secondary | ICD-10-CM | POA: Diagnosis not present

## 2015-04-17 LAB — COMPREHENSIVE METABOLIC PANEL
ALBUMIN: 4.5 g/dL (ref 3.5–5.2)
ALT: 17 U/L (ref 0–35)
AST: 15 U/L (ref 0–37)
Alkaline Phosphatase: 65 U/L (ref 39–117)
BUN: 22 mg/dL (ref 6–23)
CALCIUM: 9.6 mg/dL (ref 8.4–10.5)
CO2: 31 mEq/L (ref 19–32)
Chloride: 101 mEq/L (ref 96–112)
Creatinine, Ser: 0.93 mg/dL (ref 0.40–1.20)
GFR: 63.38 mL/min (ref >60.00)
GLUCOSE: 95 mg/dL (ref 70–99)
Potassium: 3.9 mEq/L (ref 3.5–5.1)
SODIUM: 139 meq/L (ref 135–145)
Total Bilirubin: 0.6 mg/dL (ref 0.2–1.2)
Total Protein: 7.4 g/dL (ref 6.0–8.3)

## 2015-04-17 LAB — CBC WITH DIFFERENTIAL/PLATELET
Basophils Absolute: 0 10*3/uL (ref 0.0–0.1)
Basophils Relative: 0.5 % (ref 0.0–3.0)
Eosinophils Absolute: 0.2 10*3/uL (ref 0.0–0.7)
Eosinophils Relative: 2.3 % (ref 0.0–5.0)
HEMATOCRIT: 41.2 % (ref 36.0–46.0)
Hemoglobin: 13.9 g/dL (ref 12.0–15.0)
Lymphocytes Relative: 23.5 % (ref 12.0–46.0)
Lymphs Abs: 2 10*3/uL (ref 0.7–4.0)
MCHC: 33.8 g/dL (ref 30.0–36.0)
MCV: 87.5 fl (ref 78.0–100.0)
Monocytes Absolute: 0.8 10*3/uL (ref 0.1–1.0)
Monocytes Relative: 9.4 % (ref 3.0–12.0)
Neutro Abs: 5.5 10*3/uL (ref 1.4–7.7)
Neutrophils Relative %: 64.3 % (ref 43.0–77.0)
PLATELETS: 312 10*3/uL (ref 150.0–400.0)
RBC: 4.7 Mil/uL (ref 3.87–5.11)
RDW: 13.6 % (ref 11.5–15.5)
WBC: 8.6 10*3/uL (ref 4.0–10.5)

## 2015-04-17 LAB — LIPID PANEL
CHOLESTEROL: 191 mg/dL (ref 0–200)
HDL: 53.5 mg/dL (ref >39.00)
LDL CALC: 120 mg/dL — AB (ref 0–99)
NONHDL: 137.5
TRIGLYCERIDES: 90 mg/dL (ref 0.0–149.0)
Total CHOL/HDL Ratio: 4
VLDL: 18 mg/dL (ref 0.0–40.0)

## 2015-04-17 LAB — T4, FREE: FREE T4: 0.77 ng/dL (ref 0.60–1.60)

## 2015-04-17 LAB — TSH: TSH: 2.39 u[IU]/mL (ref 0.35–4.50)

## 2015-04-17 MED ORDER — FLUTICASONE PROPIONATE 50 MCG/ACT NA SUSP: 1.0000 | Freq: Two times a day (BID) | NASAL | Status: DC

## 2015-04-17 NOTE — Progress Notes (Signed)
Subjective:   Patient ID: Patricia Peck, female    DOB: 1945-11-05, 69 y.o.   MRN: [id number]  Patricia Peck is a pleasant 69 y.o. year old female who presents to clinic today with her husband for  Annual Exam and follow up of chronic medical conditions on 04/17/2015  HPI:  I have personally reviewed the Medicare Annual Wellness questionnaire and have noted 1. The patient's medical and social history 2. Their use of alcohol, tobacco or illicit drugs 3. Their current medications and supplements 4. The patient's functional ability including ADL's, fall risks, home safety risks and hearing or visual             impairment. 5. Diet and physical activities 6. Evidence for depression or mood disorders  End of life wishes discussed and updated in Social History.  The roster of all physicians providing medical care to patient - is listed in the CareTeams section of the chart.  Remote h/o hysterectomy Mammogram 03/12/2015 Colonoscopy 07/20/14 Zostavax 04/16/2007 DEXA 04/24/14 Td 01/30/08 Pneumovax 04/08/2012 Prevnar 13 04/12/2014 Eye exam earlier this month- having cataract surgery next month.  Still playing tennis multiple times per week.  Insomnia- persistent and troubling issue for her. Currently taking Melatonin and trazodone 150 mg qhs.  Wakes up most nights at 3 am and cannot go back to sleep.  Her sister died at 32 and her family thinks since autopsy was negative, that it was due to her chronic insomnia. Does not feel depressed or anxious.   Afib- followed by Dr. Stanford Breed.  Last saw him on 07/24/14- note reviewed. No changes made.  Continued on Toprol and Xarelto, along with statin.  Hypothyroidism- on synthroid 50 mcg daily. Denies any symptoms of hypo or hyperthyroidism.  Lab Results  Component Value Date   TSH 1.78 11/13/2014   Lab Results  Component Value Date   WBC 6.2 01/03/2015   HGB 12.5 01/03/2015   HCT 36.4 01/03/2015   MCV 86.8 01/03/2015   PLT 287.0 01/03/2015    Lab Results  Component Value Date   CREATININE 0.8 11/13/2014    Current Outpatient Prescriptions on File Prior to Visit  Medication Sig Dispense Refill  . Alpha-D-Galactosidase (BEANO PO) Take by mouth 2 (two) times daily.    . Alum & Mag Hydroxide-Simeth (MAGIC MOUTHWASH W/LIDOCAINE) SOLN Take 10 mLs by mouth 4 (four) times daily as needed. Swish and spit out 10 ml by mouth 4 times a day 240 mL 0  . atorvastatin (LIPITOR) 20 MG tablet TAKE 1 TABLET BY MOUTH DAILY 30 tablet 0  . Bepotastine Besilate (BEPREVE) 1.5 % SOLN Use one drop in each eye twice a day    . bumetanide (BUMEX) 1 MG tablet TAKE 1 TABLET BY MOUTH 2 TIMES A DAY. 180 tablet 0  . buPROPion (WELLBUTRIN) 75 MG tablet TAKE 1 TABLET BY MOUTH TWICE A DAY. 180 tablet 0  . Calcium Carbonate-Vitamin D (CALCIUM 600/VITAMIN D) 600-400 MG-UNIT per tablet Take 1 tablet by mouth 2 (two) times daily.      . Cholecalciferol (VITAMIN D-3) 5000 UNITS TABS Take one tablet by mouth three times a week    . cyclobenzaprine (FLEXERIL) 10 MG tablet Take 10 mg by mouth 2 (two) times daily as needed. As needed for back pain     . diclofenac sodium (VOLTAREN) 1 % GEL Apply as needed 9 Tube 3  . famotidine (PEPCID AC) 10 MG chewable tablet Chew 10 mg by mouth daily.    Marland Kitchen  fexofenadine (ALLEGRA) 60 MG tablet Take 1 tablet (60 mg total) by mouth 2 (two) times daily. 60 tablet 3  . fluocinonide gel (LIDEX) 0.05 % Apply topically as needed.    . Fluticasone-Salmeterol (ADVAIR DISKUS) 100-50 MCG/DOSE AEPB Inhale 1 puff into the lungs 2 (two) times daily. 180 each 1  . HYDROcodone-acetaminophen (VICODIN) 5-500 MG per tablet Take 1 tablet by mouth every 6 (six) hours as needed.      Marland Kitchen levocetirizine (XYZAL) 5 MG tablet Take 1 tablet (5 mg total) by mouth every evening. 90 tablet 3  . levothyroxine (SYNTHROID, LEVOTHROID) 50 MCG tablet TAKE 1 TABLET BY MOUTH DAILY. 90 tablet 3  . Melatonin 5 MG TABS Take 3 tablets my mouth at bedtime    . metoprolol  succinate (TOPROL-XL) 25 MG 24 hr tablet Take 1 tablet (25 mg total) by mouth daily. 90 tablet 3  . NEXIUM 40 MG capsule TAKE 1 CAPSULE BY MOUTH DAILY. 90 capsule 1  . nystatin (MYCOSTATIN) 100000 UNIT/ML suspension SWISH BY MOUTH 10 ML UP TO 4 TIMES A DAY 240 mL 1  . potassium chloride (K-DUR) 10 MEQ tablet TAKE 1 TABLET BY MOUTH 2 TIMES A DAY. 180 tablet 0  . traZODone (DESYREL) 50 MG tablet TAKE 3 TABLETS BY MOUTH AT BEDTIME. 270 tablet 0  . XARELTO 20 MG TABS tablet TAKE 1 TABLET BY MOUTH DAILY. 90 tablet 1  . zolpidem (AMBIEN) 5 MG tablet Take 1 tablet (5 mg total) by mouth at bedtime as needed for sleep. 30 tablet 0   No current facility-administered medications on file prior to visit.    No Known Allergies  Past Medical History  Diagnosis Date  . Allergy   . Depression   . Hypertension   . Osteoporosis   . Hypothyroidism   . Asthma   . GERD (gastroesophageal reflux disease)   . Hyperlipidemia   . TIA (transient ischemic attack)   . Arthritis     hands  . Atrial fibrillation   . Atrial flutter     Past Surgical History  Procedure Laterality Date  . Partial hysterectomy  1989  . Colonoscopy    . Tee without cardioversion N/A 12/22/2012    Procedure: TRANSESOPHAGEAL ECHOCARDIOGRAM (TEE);  Surgeon: Lelon Perla, MD;  Location: [facility] ENDOSCOPY;  Service: Cardiovascular;  Laterality: N/A;    Family History  Problem Relation Age of Onset  . Breast cancer Mother   . Drug abuse Son   . Depression Son   . Stroke Paternal Aunt   . Heart disease Paternal Grandfather   . Diabetes Maternal Grandfather     History   Social History  . Marital Status: Married    Spouse Name: N/A  . Number of Children: 2  . Years of Education: N/A   Occupational History  .     Social History Main Topics  . Smoking status: Never Smoker   . Smokeless tobacco: Never Used  . Alcohol Use: Yes     Comment: Rare  . Drug Use: No  . Sexual Activity: Not on file   Other Topics Concern    . Not on file   Social History Narrative   Does have HPOA- sons Eddie Dibbles and Wille Glaser.   Does desire life support if not futile.   Not sure about feeding tubes.   The PMH, PSH, Social History, Family History, Medications, and allergies have been reviewed in [facility], and have been updated if relevant.   Review of Systems  Constitutional:  Negative for fatigue.  HENT: Negative.   Eyes: Negative.   Respiratory: Negative.   Cardiovascular: Negative.   Endocrine: Negative.   Genitourinary: Negative.   Musculoskeletal: Negative.   Skin: Negative.   Neurological: Negative.   Hematological: Negative.   Psychiatric/Behavioral: Positive for sleep disturbance. Negative for dysphoric mood and decreased concentration. The patient is not nervous/anxious and is not hyperactive.   All other systems reviewed and are negative.      Objective:    BP 138/72 mmHg  Pulse 53  Temp(Src) 97.4 F (36.3 C) (Oral)  Ht 5' 3.25" (1.607 m)  Wt 127 lb 12 oz (57.947 kg)  BMI 22.44 kg/m2  SpO2 97%   Physical Exam  Constitutional: She is oriented to person, place, and time. She appears well-developed and well-nourished. No distress.  HENT:  Head: Normocephalic.  Eyes: Conjunctivae are normal.  Neck: Normal range of motion.  Cardiovascular: Normal rate.   Pulmonary/Chest: Effort normal. Right breast exhibits no inverted nipple, no mass, no nipple discharge, no skin change and no tenderness. Left breast exhibits no inverted nipple, no mass, no nipple discharge, no skin change and no tenderness.  Abdominal: Hernia confirmed negative in the right inguinal area and confirmed negative in the left inguinal area.  Genitourinary: Rectum normal and vagina normal. No breast swelling, tenderness, discharge or bleeding. There is no rash, tenderness or lesion on the right labia. There is no rash, tenderness or lesion on the left labia. Right adnexum displays no mass, no tenderness and no fullness. Left adnexum displays no mass,  no tenderness and no fullness.  Musculoskeletal: Normal range of motion.  Lymphadenopathy:       Right: No inguinal adenopathy present.       Left: No inguinal adenopathy present.  Neurological: She is alert and oriented to person, place, and time. No cranial nerve deficit.  Skin: Skin is warm and dry.  Psychiatric: She has a normal mood and affect. Her behavior is normal. Judgment and thought content normal.  Nursing note and vitals reviewed.         Assessment & Plan:   Medicare annual wellness visit, subsequent  Paroxysmal atrial fibrillation  Cerebrovascular disease  Essential hypertension  Depression  Other specified hypothyroidism No Follow-up on file.

## 2015-04-17 NOTE — Addendum Note (Signed)
Addended by: Modena Nunnery on: 04/17/2015 09:36 AM   Modules accepted: Orders

## 2015-04-17 NOTE — Patient Instructions (Signed)
Great to see you. We will call you with your lab results and you can view them online.  

## 2015-04-17 NOTE — Assessment & Plan Note (Signed)
Rate controlled Continue beta blocker and Xarelto 

## 2015-04-17 NOTE — Assessment & Plan Note (Signed)
Well controlled.  No changes made. 

## 2015-04-17 NOTE — Progress Notes (Signed)
Pre visit review using our clinic review tool, if applicable. No additional management support is needed unless otherwise documented below in the visit note. 

## 2015-04-17 NOTE — Assessment & Plan Note (Signed)
Well controlled. Due for labs. No changes to rxs today.

## 2015-04-17 NOTE — Addendum Note (Signed)
Addended by: Lucille Passy on: 04/17/2015 09:22 AM   Modules accepted: Orders, SmartSet

## 2015-04-17 NOTE — Assessment & Plan Note (Signed)
The patients weight, height, BMI and visual acuity have been recorded in the chart.  Cognitive function assessed.   I have made referrals, counseling and provided education to the patient based review of the above and I have provided the pt with a written personalized care plan for preventive services.   Td today.  BME and breast exam today.

## 2015-04-17 NOTE — Assessment & Plan Note (Signed)
Stable.  No changes made. 

## 2015-05-04 ENCOUNTER — Ambulatory Visit (INDEPENDENT_AMBULATORY_CARE_PROVIDER_SITE_OTHER): Payer: Medicare Other | Admitting: Family Medicine

## 2015-05-04 ENCOUNTER — Telehealth: Payer: Self-pay | Admitting: Family Medicine

## 2015-05-04 ENCOUNTER — Ambulatory Visit (HOSPITAL_COMMUNITY): Payer: Medicare Other | Attending: Family Medicine

## 2015-05-04 ENCOUNTER — Encounter: Payer: Self-pay | Admitting: Family Medicine

## 2015-05-04 VITALS — BP 114/66 | HR 66 | Temp 97.5°F | Ht 63.25 in | Wt 128.8 lb

## 2015-05-04 DIAGNOSIS — M79662 Pain in left lower leg: Secondary | ICD-10-CM | POA: Diagnosis present

## 2015-05-04 NOTE — Telephone Encounter (Signed)
Please let pt know no clear sign of clot on DVT, full report pending.

## 2015-05-04 NOTE — Assessment & Plan Note (Signed)
Rule out DVT with dopplers even though low likelyhood on Xarelto.  most likely MSK strain.. Treat with topical voltaren get, gentle stretching, ice and elevation.

## 2015-05-04 NOTE — Progress Notes (Signed)
Pre visit review using our clinic review tool, if applicable. No additional management support is needed unless otherwise documented below in the visit note. 

## 2015-05-04 NOTE — Telephone Encounter (Signed)
Ms. Mariner notified as instructed by telephone.

## 2015-05-04 NOTE — Telephone Encounter (Signed)
-----   Message from Audree Bane sent at 05/04/2015  1:38 PM EDT ----- Regarding: Prelim vascular Venous duplex negative for DVT.

## 2015-05-04 NOTE — Progress Notes (Signed)
   Subjective:    Patient ID: Patricia Peck, female    DOB: [birth year], 69 y.o.   MRN: [id number]  HPI  69 year old female pt with history of hypothyroidism, HTN, afib, CVA of Dr. Hulen Shouts presents with  new onset  Soreness in left calf  in last week.  Sore to touch at rest as well as with walking. Also having cramping at night in that calf and down to left foot. Mild swelling left ankle.  Tried magnesium  Spray with out help.  She plays tennis without issue. No change in activity, no recent fall or injury.   Social History /Family History/Past Medical History reviewed and updated if needed. She has some cramping in past.  She is on Xarelto for afib for few years Last INR:  Year ago.  She is on Lipitor, no change in dose.  Nml CMET, cbc on 04/17/2015 Lab Results  Component Value Date   TSH 2.39 04/17/2015      Review of Systems  Constitutional: Negative for fever and fatigue.  HENT: Negative for ear pain.   Eyes: Negative for pain.  Respiratory: Negative for chest tightness and shortness of breath.   Cardiovascular: Negative for chest pain, palpitations and leg swelling.  Gastrointestinal: Negative for abdominal pain.  Genitourinary: Negative for dysuria.       Objective:   Physical Exam  Constitutional: Vital signs are normal. She appears well-developed and well-nourished. She is cooperative.  Non-toxic appearance. She does not appear ill. No distress.  HENT:  Head: Normocephalic.  Right Ear: Hearing, tympanic membrane, external ear and ear canal normal. Tympanic membrane is not erythematous, not retracted and not bulging.  Left Ear: Hearing, tympanic membrane, external ear and ear canal normal. Tympanic membrane is not erythematous, not retracted and not bulging.  Nose: No mucosal edema or rhinorrhea. Right sinus exhibits no maxillary sinus tenderness and no frontal sinus tenderness. Left sinus exhibits no maxillary sinus tenderness and no frontal sinus tenderness.    Mouth/Throat: Uvula is midline, oropharynx is clear and moist and mucous membranes are normal.  Eyes: Conjunctivae, EOM and lids are normal. Pupils are equal, round, and reactive to light. Lids are everted and swept, no foreign bodies found.  Neck: Trachea normal and normal range of motion. Neck supple. Carotid bruit is not present. No thyroid mass and no thyromegaly present.  Cardiovascular: Normal rate, regular rhythm, S1 normal, S2 normal, normal heart sounds, intact distal pulses and normal pulses.  Exam reveals no gallop and no friction rub.   No murmur heard. Pulmonary/Chest: Effort normal and breath sounds normal. No tachypnea. No respiratory distress. She has no decreased breath sounds. She has no wheezes. She has no rhonchi. She has no rales.  Abdominal: Soft. Normal appearance and bowel sounds are normal. There is no tenderness.  Musculoskeletal:  Minimal swelling left ankle, pulses 2 pilsse bilaterally  positive Homan's tes on left , no pain  In calfwith ROM of ankle.   Neurological: She is alert.  Skin: Skin is warm, dry and intact. No rash noted.  Psychiatric: Her speech is normal and behavior is normal. Judgment and thought content normal. Her mood appears not anxious. Cognition and memory are normal. She does not exhibit a depressed mood.          Assessment & Plan:

## 2015-05-04 NOTE — Progress Notes (Unsigned)
Patient ID: Patricia Peck, female   DOB: 03/06/1946, 69 y.o.   MRN: [id number] Left LE negative for DVT.

## 2015-05-04 NOTE — Patient Instructions (Signed)
Stop at front desk to set up lower extremity doppler.  If no clot on [place].. Treat as muscle strain with voltaren gel on calf, ice and elevation. Limit tennis until improving.  Got to ER if shortness of breath.

## 2015-05-16 ENCOUNTER — Telehealth: Payer: Self-pay | Admitting: *Deleted

## 2015-05-16 ENCOUNTER — Ambulatory Visit: Payer: Medicare Other | Admitting: Family Medicine

## 2015-05-16 DIAGNOSIS — R41840 Attention and concentration deficit: Secondary | ICD-10-CM

## 2015-05-16 NOTE — Telephone Encounter (Signed)
Referral placed.

## 2015-05-16 NOTE — Telephone Encounter (Signed)
Pt came into office and is requesting to be tested for ADHD.she states that the Wellbutrin is no longer effective, and she is unable to concentrate. Advised pt that, unfortunately, we do not do ADHD testing in office and she would require a referral. Per Dr Deborra Medina, a referral will be placed and I advised the pt to await a call with appt details

## 2015-06-08 ENCOUNTER — Encounter: Payer: Self-pay | Admitting: Gastroenterology

## 2015-06-13 ENCOUNTER — Ambulatory Visit (INDEPENDENT_AMBULATORY_CARE_PROVIDER_SITE_OTHER): Payer: 59 | Admitting: Psychology

## 2015-06-13 DIAGNOSIS — F4323 Adjustment disorder with mixed anxiety and depressed mood: Secondary | ICD-10-CM

## 2015-06-25 ENCOUNTER — Telehealth: Payer: Self-pay

## 2015-06-25 NOTE — Telephone Encounter (Signed)
I have electronically submitted pt's info for Prolia insurance verification and will notify you once I have a response. Thank you. °

## 2015-06-25 NOTE — Telephone Encounter (Signed)
Pt left v/m requesting prolia to be ordered; pt request cb. Last prolia given 12/27/14. Sent note to [place] for prior [place] CMA.

## 2015-07-02 NOTE — Telephone Encounter (Signed)
I have rec'd pt's insurance verification for Prolia and her secondary ins, Lorella Nimrod is requiring a prior [place].  I have completed as much of the p/a form as I could and flagged the remaining areas you will need to complete and sign.  I have placed the p/a in your in-box. Once completed, please return to me and I will complete the process.  Thank you.

## 2015-07-02 NOTE — Telephone Encounter (Signed)
Forwarded completed p/a form along w/Dexa scan from 04/24/2014 to [facility].  I'll let you know once they have responded.  Thank you.

## 2015-07-02 NOTE — Telephone Encounter (Signed)
Form completed, signed and in my box.

## 2015-07-10 ENCOUNTER — Encounter: Payer: Self-pay | Admitting: Family Medicine

## 2015-07-10 ENCOUNTER — Ambulatory Visit (INDEPENDENT_AMBULATORY_CARE_PROVIDER_SITE_OTHER): Payer: Medicare Other | Admitting: Family Medicine

## 2015-07-10 VITALS — BP 132/70 | HR 68 | Temp 97.9°F | Ht 63.25 in | Wt 134.0 lb

## 2015-07-10 DIAGNOSIS — N390 Urinary tract infection, site not specified: Secondary | ICD-10-CM | POA: Diagnosis not present

## 2015-07-10 DIAGNOSIS — R300 Dysuria: Secondary | ICD-10-CM

## 2015-07-10 LAB — POCT URINALYSIS DIPSTICK
Glucose, UA: NEGATIVE
KETONES UA: NEGATIVE
Nitrite, UA: POSITIVE
Spec Grav, UA: 1.02
Urobilinogen, UA: 0.2
pH, UA: 6

## 2015-07-10 MED ORDER — SULFAMETHOXAZOLE-TRIMETHOPRIM 800-160 MG PO TABS: 1.0000 | ORAL_TABLET | Freq: Two times a day (BID) | ORAL | Status: DC

## 2015-07-10 NOTE — Assessment & Plan Note (Signed)
Simple, treat with 3 days of sulfa. Will send for culture. Push fluids. Call if not improving as expected.

## 2015-07-10 NOTE — Progress Notes (Signed)
   Subjective:    Patient ID: Patricia Peck, female    DOB: 1946-08-24, 68 y.o.   MRN: [id number]  Urinary Tract Infection  This is a new problem. The current episode started today. The quality of the pain is described as burning. The pain is moderate. There has been no fever. She is not sexually active. There is no history of pyelonephritis. Associated symptoms include frequency, hesitancy and urgency. Pertinent negatives include no chills, discharge, flank pain, hematuria, nausea, possible pregnancy or vomiting. She has tried nothing for the symptoms. The treatment provided mild relief. There is no history of catheterization, kidney stones, recurrent UTIs, a single kidney, urinary stasis or a urological procedure.   In last week, she did have some extra sound prior to urination.   No new meds.  Review of Systems  Constitutional: Negative for chills.  Gastrointestinal: Negative for nausea and vomiting.  Genitourinary: Positive for hesitancy, urgency and frequency. Negative for hematuria and flank pain.       Objective:   Physical Exam  Constitutional: Vital signs are normal. She appears well-developed and well-nourished. She is cooperative.  Non-toxic appearance. She does not appear ill. No distress.  HENT:  Head: Normocephalic.  Right Ear: Hearing, tympanic membrane, external ear and ear canal normal. Tympanic membrane is not erythematous, not retracted and not bulging.  Left Ear: Hearing, tympanic membrane, external ear and ear canal normal. Tympanic membrane is not erythematous, not retracted and not bulging.  Nose: No mucosal edema or rhinorrhea. Right sinus exhibits no maxillary sinus tenderness and no frontal sinus tenderness. Left sinus exhibits no maxillary sinus tenderness and no frontal sinus tenderness.  Mouth/Throat: Uvula is midline, oropharynx is clear and moist and mucous membranes are normal.  Eyes: Conjunctivae, EOM and lids are normal. Pupils are equal, round, and  reactive to light. Lids are everted and swept, no foreign bodies found.  Neck: Trachea normal and normal range of motion. Neck supple. Carotid bruit is not present. No thyroid mass and no thyromegaly present.  Cardiovascular: Normal rate, regular rhythm, S1 normal, S2 normal, normal heart sounds, intact distal pulses and normal pulses.  Exam reveals no gallop and no friction rub.   No murmur heard. Pulmonary/Chest: Effort normal and breath sounds normal. No tachypnea. No respiratory distress. She has no decreased breath sounds. She has no wheezes. She has no rhonchi. She has no rales.  Abdominal: Soft. Normal appearance and bowel sounds are normal. There is no hepatosplenomegaly. There is no tenderness. There is no rigidity, no rebound, no guarding and no CVA tenderness.  Neurological: She is alert.  Skin: Skin is warm, dry and intact. No rash noted.  Psychiatric: Her speech is normal and behavior is normal. Judgment and thought content normal. Her mood appears not anxious. Cognition and memory are normal. She does not exhibit a depressed mood.          Assessment & Plan:

## 2015-07-10 NOTE — Patient Instructions (Signed)
Treat with 3 days of sulfa.  We will send for culture and call with the results in a few days.  Push fluids.  Call if not improving as expected or if new fever, abdominal pain.   Urinary Tract Infection Urinary tract infections (UTIs) can develop anywhere along your urinary tract. Your urinary tract is your body's drainage system for removing wastes and extra water. Your urinary tract includes two kidneys, two ureters, a bladder, and a urethra. Your kidneys are a pair of bean-shaped organs. Each kidney is about the size of your fist. They are located below your ribs, one on each side of your spine. CAUSES Infections are caused by microbes, which are microscopic organisms, including fungi, viruses, and bacteria. These organisms are so small that they can only be seen through a microscope. Bacteria are the microbes that most commonly cause UTIs. SYMPTOMS  Symptoms of UTIs may vary by age and gender of the patient and by the location of the infection. Symptoms in young women typically include a frequent and intense urge to urinate and a painful, burning feeling in the bladder or urethra during urination. Older women and men are more likely to be tired, shaky, and weak and have muscle aches and abdominal pain. A fever may mean the infection is in your kidneys. Other symptoms of a kidney infection include pain in your back or sides below the ribs, nausea, and vomiting. DIAGNOSIS To diagnose a UTI, your caregiver will ask you about your symptoms. Your caregiver also will ask to provide a urine sample. The urine sample will be tested for bacteria and white blood cells. White blood cells are made by your body to help fight infection. TREATMENT  Typically, UTIs can be treated with medication. Because most UTIs are caused by a bacterial infection, they usually can be treated with the use of antibiotics. The choice of antibiotic and length of treatment depend on your symptoms and the type of bacteria causing  your infection. HOME CARE INSTRUCTIONS  If you were prescribed antibiotics, take them exactly as your caregiver instructs you. Finish the medication even if you feel better after you have only taken some of the medication.  Drink enough water and fluids to keep your urine clear or pale yellow.  Avoid caffeine, tea, and carbonated beverages. They tend to irritate your bladder.  Empty your bladder often. Avoid holding urine for long periods of time.  Empty your bladder before and after sexual intercourse.  After a bowel movement, women should cleanse from front to back. Use each tissue only once. SEEK MEDICAL CARE IF:   You have back pain.  You develop a fever.  Your symptoms do not begin to resolve within 3 days. SEEK IMMEDIATE MEDICAL CARE IF:   You have severe back pain or lower abdominal pain.  You develop chills.  You have nausea or vomiting.  You have continued burning or discomfort with urination. MAKE SURE YOU:   Understand these instructions.  Will watch your condition.  Will get help right away if you are not doing well or get worse. Document Released: 07/30/2005 Document Revised: 04/20/2012 Document Reviewed: 11/28/2011 [facility] Patient Information 2015 [place], [place]. This information is not intended to replace advice given to you by your health care provider. Make sure you discuss any questions you have with your health care provider.

## 2015-07-10 NOTE — Progress Notes (Signed)
Pre visit review using our clinic review tool, if applicable. No additional management support is needed unless otherwise documented below in the visit note. 

## 2015-07-10 NOTE — Addendum Note (Signed)
Addended by: Carter Kitten on: 07/10/2015 01:46 PM   Modules accepted: Orders

## 2015-07-13 LAB — URINE CULTURE

## 2015-07-14 ENCOUNTER — Other Ambulatory Visit: Payer: Self-pay | Admitting: Cardiology

## 2015-07-14 ENCOUNTER — Other Ambulatory Visit: Payer: Self-pay | Admitting: Family Medicine

## 2015-07-17 ENCOUNTER — Ambulatory Visit (INDEPENDENT_AMBULATORY_CARE_PROVIDER_SITE_OTHER): Payer: Medicare Other | Admitting: Internal Medicine

## 2015-07-17 ENCOUNTER — Encounter: Payer: Self-pay | Admitting: Internal Medicine

## 2015-07-17 VITALS — BP 122/80 | HR 62 | Temp 97.7°F | Wt 131.5 lb

## 2015-07-17 DIAGNOSIS — K219 Gastro-esophageal reflux disease without esophagitis: Secondary | ICD-10-CM | POA: Diagnosis not present

## 2015-07-17 MED ORDER — DEXLANSOPRAZOLE 60 MG PO CPDR: 60.0000 mg | DELAYED_RELEASE_CAPSULE | Freq: Every day | ORAL | Status: DC

## 2015-07-17 NOTE — Patient Instructions (Signed)
Heartburn  Heartburn is a painful, burning sensation in the chest. It may feel worse in certain positions, such as lying down or bending over. It is caused by stomach acid backing up into the tube that carries food from the mouth down to the stomach (lower esophagus).   CAUSES   · Large meals.  · Certain foods and drinks.  · Exercise.  · Increased acid production.  · Being overweight or obese.  · Certain medicines.  SYMPTOMS   · Burning pain in the chest or lower throat.  · Bitter taste in the mouth.  · Coughing.  DIAGNOSIS   If the usual treatments for heartburn do not improve your symptoms, then tests may be done to see if there is another condition present. Possible tests may include:  · X-rays.  · Endoscopy. This is when a tube with a light and a camera on the end is used to examine the esophagus and the stomach.  · A test to measure the amount of acid in the esophagus (pH test).  · A test to see if the esophagus is working properly (esophageal manometry).  · Blood, breath, or stool tests to check for bacteria that cause ulcers.  TREATMENT   · Your caregiver may tell you to use certain over-the-counter medicines (antacids, acid reducers) for mild heartburn.  · Your caregiver may prescribe medicines to decrease the acid in your stomach or protect your stomach lining.  · Your caregiver may recommend certain diet changes.  · For severe cases, your caregiver may recommend that the head of your bed be elevated on blocks. (Sleeping with more pillows is not an effective treatment as it only changes the position of your head and does not improve the main problem of stomach acid refluxing into the esophagus.)  HOME CARE INSTRUCTIONS   · Take all medicines as directed by your caregiver.  · Raise the head of your bed by putting blocks under the legs if instructed to by your caregiver.  · Do not exercise right after eating.  · Avoid eating 2 or 3 hours before bed. Do not lie down right after eating.  · Eat small meals  throughout the day instead of 3 large meals.  · Stop smoking if you smoke.  · Maintain a healthy weight.  · Identify foods and beverages that make your symptoms worse and avoid them. Foods you may want to avoid include:  ¨ Peppers.  ¨ Chocolate.  ¨ High-fat foods, including fried foods.  ¨ Spicy foods.  ¨ Garlic and onions.  ¨ Citrus fruits, including oranges, grapefruit, lemons, and limes.  ¨ Food containing tomatoes or tomato products.  ¨ Mint.  ¨ Carbonated drinks, caffeinated drinks, and alcohol.  ¨ Vinegar.  SEEK IMMEDIATE MEDICAL CARE IF:  · You have severe chest pain that goes down your arm or into your jaw or neck.  · You feel sweaty, dizzy, or lightheaded.  · You are short of breath.  · You vomit blood.  · You have difficulty or pain with swallowing.  · You have bloody or black, tarry stools.  · You have episodes of heartburn more than 3 times a week for more than 2 weeks.  MAKE SURE YOU:  · Understand these instructions.  · Will watch your condition.  · Will get help right away if you are not doing well or get worse.  Document Released: 03/08/2009 Document Revised: 01/12/2012 Document Reviewed: 04/06/2011  Exit[employer]® Patient Information ©2015 [employer]. This information is   not intended to replace advice given to you by your health care provider. Make sure you discuss any questions you have with your health care provider.

## 2015-07-17 NOTE — Progress Notes (Signed)
Subjective:    Patient ID: Patricia Peck, female    DOB: 1946-01-29, 69 y.o.   MRN: [id number]  HPI Patricia Peck is a 69 year old female who presents today with chief complaint of heartburn.  She has a past medical history of GERD.  She currently takes Nexium 40mg  daily and Pepcid.   She reports that she has a burning sensation in her throat after she eats.  She has been under stress lately.  Denies cough, shortness of breath, chest pain.    Review of Systems  Constitutional: Negative for fever, chills and fatigue.  HENT: Negative for congestion, rhinorrhea, sinus pressure and sore throat.   Respiratory: Negative for cough, shortness of breath and wheezing.   Cardiovascular: Negative for chest pain, palpitations and leg swelling.  Gastrointestinal: Negative for abdominal pain.       Experiences frequent heartburn   Genitourinary: Negative.   Musculoskeletal: Negative for back pain, joint swelling and gait problem.  Skin: Negative.   Psychiatric/Behavioral: Negative.    Family History  Problem Relation Age of Onset  . Breast cancer Mother   . Drug abuse Son   . Depression Son   . Stroke Paternal Aunt   . Heart disease Paternal Grandfather   . Diabetes Maternal Grandfather    Current Outpatient Prescriptions on File Prior to Visit  Medication Sig Dispense Refill  . Alpha-D-Galactosidase (BEANO PO) Take by mouth 2 (two) times daily.    Marland Kitchen atorvastatin (LIPITOR) 20 MG tablet TAKE 1 TABLET BY MOUTH DAILY 90 tablet 1  . Bepotastine Besilate (BEPREVE) 1.5 % SOLN Use one drop in each eye twice a day    . bumetanide (BUMEX) 1 MG tablet TAKE 1 TABLET BY MOUTH 2 TIMES A DAY. 180 tablet 1  . buPROPion (WELLBUTRIN) 75 MG tablet TAKE 1 TABLET BY MOUTH TWICE A DAY. 180 tablet 1  . Calcium Carbonate-Vitamin D (CALCIUM 600/VITAMIN D) 600-400 MG-UNIT per tablet Take 1 tablet by mouth 2 (two) times daily.      . Cholecalciferol (VITAMIN D-3) 5000 UNITS TABS Take one tablet by mouth three times a  week    . cyclobenzaprine (FLEXERIL) 10 MG tablet Take 10 mg by mouth 2 (two) times daily as needed. As needed for back pain     . diclofenac sodium (VOLTAREN) 1 % GEL Apply as needed 9 Tube 3  . famotidine (PEPCID AC) 10 MG chewable tablet Chew 10 mg by mouth daily.    . fexofenadine (ALLEGRA) 60 MG tablet Take 1 tablet (60 mg total) by mouth 2 (two) times daily. 60 tablet 3  . fluocinonide gel (LIDEX) 0.05 % Apply topically as needed.    . fluticasone (FLONASE) 50 MCG/ACT nasal spray PLACE 1 SPRAY INTO BOTH NOSTRILS 2 TIMES DAILY. 48 g 11  . Fluticasone-Salmeterol (ADVAIR DISKUS) 100-50 MCG/DOSE AEPB Inhale 1 puff into the lungs 2 (two) times daily. 180 each 1  . HYDROcodone-acetaminophen (VICODIN) 5-500 MG per tablet Take 1 tablet by mouth every 6 (six) hours as needed.      Marland Kitchen levocetirizine (XYZAL) 5 MG tablet Take 1 tablet (5 mg total) by mouth every evening. 90 tablet 3  . levothyroxine (SYNTHROID, LEVOTHROID) 50 MCG tablet TAKE 1 TABLET BY MOUTH DAILY. 90 tablet 3  . MAGNESIUM PO Take 1 tablet by mouth daily.    . Melatonin 5 MG TABS Take 3 tablets my mouth at bedtime    . metoprolol succinate (TOPROL-XL) 25 MG 24 hr tablet TAKE 1 TABLET (  25 MG TOTAL) BY MOUTH DAILY. 90 tablet 0  . NEXIUM 40 MG capsule TAKE 1 CAPSULE BY MOUTH DAILY. 90 capsule 1  . nystatin (MYCOSTATIN) 100000 UNIT/ML suspension SWISH BY MOUTH 10 ML UP TO 4 TIMES A DAY 240 mL 1  . potassium chloride (K-DUR) 10 MEQ tablet TAKE 1 TABLET BY MOUTH 2 TIMES A DAY. 180 tablet 3  . traZODone (DESYREL) 50 MG tablet TAKE 3 TABLETS BY MOUTH AT BEDTIME. 270 tablet 0  . XARELTO 20 MG TABS tablet TAKE 1 TABLET BY MOUTH DAILY. 90 tablet 1  . zolpidem (AMBIEN) 5 MG tablet Take 1 tablet (5 mg total) by mouth at bedtime as needed for sleep. 30 tablet 0   No current facility-administered medications on file prior to visit.       Objective:   Physical Exam  Constitutional: She is oriented to person, place, and time. She appears  well-developed and well-nourished.  HENT:  Head: Normocephalic and atraumatic.  Eyes: Pupils are equal, round, and reactive to light.  Neck: Normal range of motion. Neck supple. No thyromegaly present.  Cardiovascular: Normal rate, regular rhythm and normal heart sounds.   Pulmonary/Chest: Effort normal and breath sounds normal.  Abdominal: Soft. Bowel sounds are normal.  Musculoskeletal: Normal range of motion.  Neurological: She is alert and oriented to person, place, and time.  Skin: Skin is warm and dry.    BP 122/80 mmHg  Pulse 62  Temp(Src) 97.7 F (36.5 C) (Oral)  Wt 131 lb 8 oz (59.648 kg)  SpO2 97%       Assessment & Plan:  1. GERD Rx for Dexilent 30mg  daily. Strict diet.  If symptoms persist send to GI for evaluation.

## 2015-07-17 NOTE — Progress Notes (Signed)
Subjective:    Patient ID: Patricia Peck, female    DOB: [birth year], 69 y.o.   MRN: [id number]  HPI  Pt presents to the clinic today with c/o heartburn. She has a history of GERD but reports this has gotten worse in the last few weeks. She describes it as a burning sensation in her chest. Her symptoms seem worse after she takes her pills and after eating. She denies sore throat, hoarseness, or cough while laying down. She denies chest pain or shortness of breath. She takes Nexium and Pepcid daily. She has taken Rolaids as needed but reports it has not helped. Her bowels are moving normally. She has never had an upper endoscopy. She does have a history of HTN, Afib and HLD. She does feel like she has been more stressed lately. She denies changes in her diet.  Review of Systems      Past Medical History  Diagnosis Date  . Allergy   . Depression   . Hypertension   . Osteoporosis   . Hypothyroidism   . Asthma   . GERD (gastroesophageal reflux disease)   . Hyperlipidemia   . TIA (transient ischemic attack)   . Arthritis     hands  . Atrial fibrillation   . Atrial flutter     Current Outpatient Prescriptions  Medication Sig Dispense Refill  . Alpha-D-Galactosidase (BEANO PO) Take by mouth 2 (two) times daily.    . Alum & Mag Hydroxide-Simeth (MAGIC MOUTHWASH W/LIDOCAINE) SOLN Take 10 mLs by mouth 4 (four) times daily as needed. Swish and spit out 10 ml by mouth 4 times a day 240 mL 0  . atorvastatin (LIPITOR) 20 MG tablet TAKE 1 TABLET BY MOUTH DAILY 90 tablet 1  . Bepotastine Besilate (BEPREVE) 1.5 % SOLN Use one drop in each eye twice a day    . bumetanide (BUMEX) 1 MG tablet TAKE 1 TABLET BY MOUTH 2 TIMES A DAY. 180 tablet 1  . buPROPion (WELLBUTRIN) 75 MG tablet TAKE 1 TABLET BY MOUTH TWICE A DAY. 180 tablet 1  . Calcium Carbonate-Vitamin D (CALCIUM 600/VITAMIN D) 600-400 MG-UNIT per tablet Take 1 tablet by mouth 2 (two) times daily.      . Cholecalciferol (VITAMIN D-3) 5000  UNITS TABS Take one tablet by mouth three times a week    . cyclobenzaprine (FLEXERIL) 10 MG tablet Take 10 mg by mouth 2 (two) times daily as needed. As needed for back pain     . diclofenac sodium (VOLTAREN) 1 % GEL Apply as needed 9 Tube 3  . famotidine (PEPCID AC) 10 MG chewable tablet Chew 10 mg by mouth daily.    . fexofenadine (ALLEGRA) 60 MG tablet Take 1 tablet (60 mg total) by mouth 2 (two) times daily. 60 tablet 3  . fluocinonide gel (LIDEX) 0.05 % Apply topically as needed.    . fluticasone (FLONASE) 50 MCG/ACT nasal spray PLACE 1 SPRAY INTO BOTH NOSTRILS 2 TIMES DAILY. 48 g 11  . Fluticasone-Salmeterol (ADVAIR DISKUS) 100-50 MCG/DOSE AEPB Inhale 1 puff into the lungs 2 (two) times daily. 180 each 1  . HYDROcodone-acetaminophen (VICODIN) 5-500 MG per tablet Take 1 tablet by mouth every 6 (six) hours as needed.      Marland Kitchen levocetirizine (XYZAL) 5 MG tablet Take 1 tablet (5 mg total) by mouth every evening. 90 tablet 3  . levothyroxine (SYNTHROID, LEVOTHROID) 50 MCG tablet TAKE 1 TABLET BY MOUTH DAILY. 90 tablet 3  . MAGNESIUM PO Take 1 tablet  by mouth daily.    . Melatonin 5 MG TABS Take 3 tablets my mouth at bedtime    . metoprolol succinate (TOPROL-XL) 25 MG 24 hr tablet TAKE 1 TABLET (25 MG TOTAL) BY MOUTH DAILY. 90 tablet 0  . NEXIUM 40 MG capsule TAKE 1 CAPSULE BY MOUTH DAILY. 90 capsule 1  . nystatin (MYCOSTATIN) 100000 UNIT/ML suspension SWISH BY MOUTH 10 ML UP TO 4 TIMES A DAY 240 mL 1  . potassium chloride (K-DUR) 10 MEQ tablet TAKE 1 TABLET BY MOUTH 2 TIMES A DAY. 180 tablet 3  . sulfamethoxazole-trimethoprim (BACTRIM DS,SEPTRA DS) 800-160 MG per tablet Take 1 tablet by mouth 2 (two) times daily. 6 tablet 0  . traZODone (DESYREL) 50 MG tablet TAKE 3 TABLETS BY MOUTH AT BEDTIME. 270 tablet 0  . XARELTO 20 MG TABS tablet TAKE 1 TABLET BY MOUTH DAILY. 90 tablet 1  . zolpidem (AMBIEN) 5 MG tablet Take 1 tablet (5 mg total) by mouth at bedtime as needed for sleep. 30 tablet 0   No  current facility-administered medications for this visit.    No Known Allergies  Family History  Problem Relation Age of Onset  . Breast cancer Mother   . Drug abuse Son   . Depression Son   . Stroke Paternal Aunt   . Heart disease Paternal Grandfather   . Diabetes Maternal Grandfather     Social History   Social History  . Marital Status: Married    Spouse Name: N/A  . Number of Children: 2  . Years of Education: N/A   Occupational History  .     Social History Main Topics  . Smoking status: Never Smoker   . Smokeless tobacco: Never Used  . Alcohol Use: Yes     Comment: Rare  . Drug Use: No  . Sexual Activity: Not on file   Other Topics Concern  . Not on file   Social History Narrative   Does have HPOA- sons Eddie Dibbles and Wille Glaser.   Does desire life support if not futile.   Not sure about feeding tubes.     Constitutional: Denies fever, malaise, fatigue, headache or abrupt weight changes.  HEENT: Denies eye pain, eye redness, ear pain, ringing in the ears, wax buildup, runny nose, nasal congestion, bloody nose, or sore throat. Respiratory: Denies difficulty breathing, shortness of breath, cough or sputum production.   Cardiovascular: Denies chest pain, chest tightness, palpitations or swelling in the hands or feet.  Gastrointestinal: Pt reports reflux. Denies abdominal pain, bloating, constipation, diarrhea or blood in the stool.   No other specific complaints in a complete review of systems (except as listed in HPI above).  Objective:   Physical Exam  BP 122/80 mmHg  Pulse 62  Temp(Src) 97.7 F (36.5 C) (Oral)  Wt 131 lb 8 oz (59.648 kg)  SpO2 97% Wt Readings from Last 3 Encounters:  07/17/15 131 lb 8 oz (59.648 kg)  07/10/15 134 lb (60.782 kg)  05/04/15 128 lb 12 oz (58.401 kg)    General: Appears her stated age, well developed, well nourished in NAD.  Cardiovascular: Normal rate and rhythm. S1,S2 noted.  No murmur, rubs or gallops noted.    Pulmonary/Chest: Normal effort and positive vesicular breath sounds. No respiratory distress. No wheezes, rales or ronchi noted.  Abdomen: Soft and nontender. Normal bowel sounds, no bruits noted. No distention or masses noted. Liver, spleen and kidneys non palpable. Neurological: Alert and oriented.   BMET    Component  Value Date/Time   NA 139 04/17/2015 0923   K 3.9 04/17/2015 0923   CL 101 04/17/2015 0923   CO2 31 04/17/2015 0923   GLUCOSE 95 04/17/2015 0923   BUN 22 04/17/2015 0923   CREATININE 0.93 04/17/2015 0923   CREATININE 0.83 10/04/2014 1216   CALCIUM 9.6 04/17/2015 0923   GFRNONAA 72 10/04/2014 1216   GFRNONAA 77 10/03/2008 1006   GFRAA 83 10/04/2014 1216   GFRAA 93 10/03/2008 1006    Lipid Panel     Component Value Date/Time   CHOL 191 04/17/2015 0923   TRIG 90.0 04/17/2015 0923   HDL 53.50 04/17/2015 0923   CHOLHDL 4 04/17/2015 0923   VLDL 18.0 04/17/2015 0923   LDLCALC 120* 04/17/2015 0923    CBC    Component Value Date/Time   WBC 8.6 04/17/2015 0923   RBC 4.70 04/17/2015 0923   HGB 13.9 04/17/2015 0923   HCT 41.2 04/17/2015 0923   PLT 312.0 04/17/2015 0923   MCV 87.5 04/17/2015 0923   MCH 29.5 10/04/2014 1216   MCHC 33.8 04/17/2015 0923   RDW 13.6 04/17/2015 0923   LYMPHSABS 2.0 04/17/2015 0923   MONOABS 0.8 04/17/2015 0923   EOSABS 0.2 04/17/2015 0923   BASOSABS 0.0 04/17/2015 0923    Hgb A1C No results found for: HGBA1C       Assessment & Plan:

## 2015-07-17 NOTE — Progress Notes (Signed)
Pre visit review using our clinic review tool, if applicable. No additional management support is needed unless otherwise documented below in the visit note. 

## 2015-07-17 NOTE — Assessment & Plan Note (Signed)
Will switch Nexium to Dexilant Continue Pepcid daily Continue Rolaids as needed Try to avoid any foods that seem to make your reflux worse If persist, will refer to GI for upper endoscopy

## 2015-07-18 ENCOUNTER — Telehealth: Payer: Self-pay

## 2015-07-18 DIAGNOSIS — Z1159 Encounter for screening for other viral diseases: Secondary | ICD-10-CM

## 2015-07-18 NOTE — Telephone Encounter (Signed)
Pt left v/m; on my chart hep c screening is recommended for pts born from 1945 - 1966; pt also wants to know if Dr Deborra Medina recommends pt to get flu shot pt request cb.

## 2015-07-18 NOTE — Telephone Encounter (Signed)
Spoke to pt and advised per Dr Deborra Medina. Pt is agreeable to both. Pt scheduled for flu shot and HepC screening and is requesting orders be placed fir 09/16 appt

## 2015-07-18 NOTE — Telephone Encounter (Signed)
Yes I recommend that she have both done.

## 2015-07-19 NOTE — Telephone Encounter (Signed)
Order placed

## 2015-07-20 ENCOUNTER — Other Ambulatory Visit (INDEPENDENT_AMBULATORY_CARE_PROVIDER_SITE_OTHER): Payer: Medicare Other

## 2015-07-20 ENCOUNTER — Ambulatory Visit (INDEPENDENT_AMBULATORY_CARE_PROVIDER_SITE_OTHER): Payer: Medicare Other

## 2015-07-20 DIAGNOSIS — Z23 Encounter for immunization: Secondary | ICD-10-CM | POA: Diagnosis not present

## 2015-07-20 DIAGNOSIS — M810 Age-related osteoporosis without current pathological fracture: Secondary | ICD-10-CM

## 2015-07-20 DIAGNOSIS — Z1159 Encounter for screening for other viral diseases: Secondary | ICD-10-CM

## 2015-07-20 LAB — CALCIUM: Calcium: 9.1 mg/dL (ref 8.6–10.4)

## 2015-07-20 LAB — HEPATITIS C ANTIBODY: HCV Ab: NEGATIVE

## 2015-07-23 ENCOUNTER — Encounter: Payer: Self-pay | Admitting: *Deleted

## 2015-08-06 MED ORDER — DENOSUMAB 60 MG/ML ~~LOC~~ SOLN: 60.0000 mg | Freq: Once | SUBCUTANEOUS | Status: AC

## 2015-08-06 NOTE — Telephone Encounter (Signed)
I forgot to add in the prev mssg, but please send me the actual injection date once Patricia Peck rec's her inj. Thank you.

## 2015-08-06 NOTE — Telephone Encounter (Signed)
Spoke to pt and advised regarding prolia. Pt is needing Prolia Rx sent to pharmacy to pick up for 08/14/15 injection

## 2015-08-06 NOTE — Telephone Encounter (Addendum)
I spoke to Cochran at [place] [phone]) and he says pt is approved for  TWO (2) Prolia injections effective 06/07/2015-08/05/2016. He states there is no authorization # initiated and that everything will be under her [employer] ID #.    Patient's estimated responsibility will be $0.  Please make pt aware this is only an estimate and we will not know an exact amt until her insurances have paid.  Also, be aware pt will be required to pick up the Prolia at her pharmacy.  She can then bring it in for her injection.  I have sent a copy of the summary of benefits to be scanned into her chart.  Please let me know if you have any questions. Thank you.  Cc: Aniceto Boss (for info purposes)

## 2015-08-06 NOTE — Telephone Encounter (Signed)
eRx sent

## 2015-08-07 NOTE — Telephone Encounter (Signed)
After all the confusion from Prolia, I called [employer] and spoke to [employer]. (ref [phone]). Prolia stated a p/a was needed, but that is incorrect since we are filing under the medical part of pt's insurance, NOT the pharmacy part.  He states no prior [name] is necessary and that the pt's estimated responsibility will be $0.  I have notified pt that everything is finally corrected and have notified her of her estimated responsibility.  Pt will rec Prolia and admin here. Please let me know if you have any questions.  Cc: Dee for ordering purposes

## 2015-08-14 ENCOUNTER — Ambulatory Visit: Payer: Self-pay

## 2015-08-21 ENCOUNTER — Ambulatory Visit (INDEPENDENT_AMBULATORY_CARE_PROVIDER_SITE_OTHER): Payer: Medicare Other

## 2015-08-21 DIAGNOSIS — M810 Age-related osteoporosis without current pathological fracture: Secondary | ICD-10-CM

## 2015-08-21 MED ORDER — DENOSUMAB 60 MG/ML ~~LOC~~ SOLN
60.0000 mg | Freq: Once | SUBCUTANEOUS | Status: AC
  Administered 2015-08-21: 60 mg via SUBCUTANEOUS

## 2015-10-10 ENCOUNTER — Other Ambulatory Visit: Payer: Self-pay | Admitting: Cardiology

## 2015-10-10 ENCOUNTER — Other Ambulatory Visit: Payer: Self-pay | Admitting: Family Medicine

## 2015-10-10 ENCOUNTER — Other Ambulatory Visit: Payer: Self-pay | Admitting: Internal Medicine

## 2015-10-11 NOTE — Telephone Encounter (Signed)
Will need to call the pt to schedule follow up

## 2015-11-04 DIAGNOSIS — I639 Cerebral infarction, unspecified: Secondary | ICD-10-CM

## 2015-11-04 HISTORY — DX: Cerebral infarction, unspecified: I63.9

## 2015-11-26 ENCOUNTER — Ambulatory Visit (INDEPENDENT_AMBULATORY_CARE_PROVIDER_SITE_OTHER): Payer: Medicare Other | Admitting: Internal Medicine

## 2015-11-26 ENCOUNTER — Encounter: Payer: Self-pay | Admitting: Internal Medicine

## 2015-11-26 VITALS — BP 122/70 | HR 71 | Temp 98.2°F | Wt 128.0 lb

## 2015-11-26 DIAGNOSIS — R0982 Postnasal drip: Secondary | ICD-10-CM | POA: Diagnosis not present

## 2015-11-26 DIAGNOSIS — R059 Cough, unspecified: Secondary | ICD-10-CM

## 2015-11-26 DIAGNOSIS — R05 Cough: Secondary | ICD-10-CM

## 2015-11-26 MED ORDER — HYDROCODONE-HOMATROPINE 5-1.5 MG/5ML PO SYRP: 5.0000 mL | ORAL_SOLUTION | Freq: Three times a day (TID) | ORAL | Status: DC | PRN

## 2015-11-26 NOTE — Patient Instructions (Signed)

## 2015-11-26 NOTE — Progress Notes (Signed)
HPI  Pt presents to the clinic today with c/o runny nose, ear fullness and cough. This started 2-3 weeks ago. She is blowing mucous out of her nose. The cough is nonproductive. She denies decreased hearing but reports she has been intermittently dizzy. Her eyes have been watering. She denies fever, chills or body aches. She has not tried anything OTC. She does have a history of seasonal allergies and asthma. She has had sick contacts.  Review of Systems      Past Medical History  Diagnosis Date  . Allergy   . Depression   . Hypertension   . Osteoporosis   . Hypothyroidism   . Asthma   . GERD (gastroesophageal reflux disease)   . Hyperlipidemia   . TIA (transient ischemic attack)   . Arthritis     hands  . Atrial fibrillation ([place])   . Atrial flutter ([place])     Family History  Problem Relation Age of Onset  . Breast cancer Mother   . Drug abuse Son   . Depression Son   . Stroke Paternal Aunt   . Heart disease Paternal Grandfather   . Diabetes Maternal Grandfather     Social History   Social History  . Marital Status: Married    Spouse Name: N/A  . Number of Children: 2  . Years of Education: N/A   Occupational History  .     Social History Main Topics  . Smoking status: Never Smoker   . Smokeless tobacco: Never Used  . Alcohol Use: Yes     Comment: Rare  . Drug Use: No  . Sexual Activity: Not on file   Other Topics Concern  . Not on file   Social History Narrative   Does have HPOA- sons Eddie Dibbles and Wille Glaser.   Does desire life support if not futile.   Not sure about feeding tubes.    No Known Allergies   Constitutional: Positive headache, fatigue. Denies fever or abrupt weight changes.  HEENT:  Positive runny nose, ear fullness. Denies eye redness, eye pain, pressure behind the eyes, facial pain, nasal congestion, ear pain, ringing in the ears, wax buildup or sore throat. Respiratory: Positive cough. Denies difficulty breathing or shortness of breath.   Cardiovascular: Denies chest pain, chest tightness, palpitations or swelling in the hands or feet.   No other specific complaints in a complete review of systems (except as listed in HPI above).  Objective:   BP 122/70 mmHg  Pulse 71  Temp(Src) 98.2 F (36.8 C) (Oral)  Wt 128 lb (58.06 kg)  SpO2 97%  Wt Readings from Last 3 Encounters:  11/26/15 128 lb (58.06 kg)  07/17/15 131 lb 8 oz (59.648 kg)  07/10/15 134 lb (60.782 kg)     General: Appears her stated age, well developed, well nourished in NAD. HEENT: Head: normal shape and size, no sinus tenderness noted; Eyes: sclera white, no icterus, conjunctiva pink; Ears: bilateral cerumen impaction; Nose: mucosa boggy and moist, septum midline; Throat/Mouth: + PND. Teeth present, mucosa pink and moist, no exudate noted, no lesions or ulcerations noted.  Neck: No cervical lymphadenopathy.  Cardiovascular: Normal rate and rhythm. S1,S2 noted.  No murmur, rubs or gallops noted.  Pulmonary/Chest: Normal effort and positive vesicular breath sounds. No respiratory distress. No wheezes, rales or ronchi noted.      Assessment & Plan:   Cough and Post Nasal Drip:  Get some rest and drink plenty of water Continue Allegra and nasal spray No s/s of  infection- no need for abx Rx for Hycodan cough syrup Follow up with ENT  RTC as needed or if symptoms persist.

## 2015-11-26 NOTE — Progress Notes (Signed)
Pre visit review using our clinic review tool, if applicable. No additional management support is needed unless otherwise documented below in the visit note. 

## 2015-12-19 ENCOUNTER — Telehealth: Payer: Self-pay | Admitting: *Deleted

## 2015-12-19 NOTE — Telephone Encounter (Signed)
Patient called requesting to get set up for next Prolia injection due in March.  Please advise.

## 2015-12-19 NOTE — Telephone Encounter (Signed)
Can you check on this regarding coverage?

## 2016-01-02 ENCOUNTER — Other Ambulatory Visit: Payer: Self-pay | Admitting: Otolaryngology

## 2016-01-04 NOTE — Telephone Encounter (Signed)
I have electronically submitted pt's info for Prolia insurance verification and will notify you once I have a response. Thank you. °

## 2016-01-07 ENCOUNTER — Other Ambulatory Visit: Payer: Self-pay | Admitting: Family Medicine

## 2016-01-08 ENCOUNTER — Encounter (HOSPITAL_BASED_OUTPATIENT_CLINIC_OR_DEPARTMENT_OTHER): Payer: Self-pay | Admitting: *Deleted

## 2016-01-08 NOTE — Progress Notes (Signed)
Patient states that Dr Benjamine Mola told her that she did not have to stop her Xarelto for surgery. She has Hx A-fib and will come in for an EKG preop.

## 2016-01-09 ENCOUNTER — Encounter (HOSPITAL_BASED_OUTPATIENT_CLINIC_OR_DEPARTMENT_OTHER)
Admission: RE | Admit: 2016-01-09 | Discharge: 2016-01-09 | Disposition: A | Discharge status: Home / Self Care | Payer: Medicare Other | Source: Ambulatory Visit | Attending: Otolaryngology | Admitting: Otolaryngology

## 2016-01-09 DIAGNOSIS — Z01810 Encounter for preprocedural cardiovascular examination: Secondary | ICD-10-CM | POA: Diagnosis not present

## 2016-01-09 DIAGNOSIS — I4891 Unspecified atrial fibrillation: Secondary | ICD-10-CM | POA: Diagnosis not present

## 2016-01-09 DIAGNOSIS — I10 Essential (primary) hypertension: Secondary | ICD-10-CM | POA: Insufficient documentation

## 2016-01-09 NOTE — Telephone Encounter (Signed)
Left mssg on v/m to return my call.

## 2016-01-09 NOTE — Telephone Encounter (Signed)
Pt left v/m requesting status of prolia visit.

## 2016-01-14 ENCOUNTER — Ambulatory Visit (HOSPITAL_BASED_OUTPATIENT_CLINIC_OR_DEPARTMENT_OTHER)
Admission: RE | Admit: 2016-01-14 | Discharge: 2016-01-14 | Disposition: A | Discharge status: Home / Self Care | Payer: Medicare Other | Source: Ambulatory Visit | Attending: Otolaryngology | Admitting: Otolaryngology

## 2016-01-14 ENCOUNTER — Ambulatory Visit (HOSPITAL_BASED_OUTPATIENT_CLINIC_OR_DEPARTMENT_OTHER): Payer: Medicare Other | Admitting: Anesthesiology

## 2016-01-14 ENCOUNTER — Encounter (HOSPITAL_BASED_OUTPATIENT_CLINIC_OR_DEPARTMENT_OTHER): Payer: Self-pay

## 2016-01-14 ENCOUNTER — Encounter (HOSPITAL_BASED_OUTPATIENT_CLINIC_OR_DEPARTMENT_OTHER)
Admission: RE | Disposition: A | Discharge status: Home / Self Care | Payer: Self-pay | Source: Ambulatory Visit | Attending: Otolaryngology

## 2016-01-14 DIAGNOSIS — I440 Atrioventricular block, first degree: Secondary | ICD-10-CM | POA: Diagnosis not present

## 2016-01-14 DIAGNOSIS — R42 Dizziness and giddiness: Secondary | ICD-10-CM | POA: Insufficient documentation

## 2016-01-14 DIAGNOSIS — H6981 Other specified disorders of Eustachian tube, right ear: Secondary | ICD-10-CM | POA: Diagnosis not present

## 2016-01-14 DIAGNOSIS — I10 Essential (primary) hypertension: Secondary | ICD-10-CM | POA: Diagnosis not present

## 2016-01-14 DIAGNOSIS — K219 Gastro-esophageal reflux disease without esophagitis: Secondary | ICD-10-CM | POA: Insufficient documentation

## 2016-01-14 DIAGNOSIS — I491 Atrial premature depolarization: Secondary | ICD-10-CM | POA: Insufficient documentation

## 2016-01-14 DIAGNOSIS — I4891 Unspecified atrial fibrillation: Secondary | ICD-10-CM | POA: Diagnosis not present

## 2016-01-14 DIAGNOSIS — Z79899 Other long term (current) drug therapy: Secondary | ICD-10-CM | POA: Insufficient documentation

## 2016-01-14 HISTORY — PX: MYRINGOTOMY WITH TUBE PLACEMENT: SHX5663

## 2016-01-14 HISTORY — DX: Cardiac arrhythmia, unspecified: I49.9

## 2016-01-14 LAB — POCT I-STAT, CHEM 8
BUN: 12 mg/dL (ref 6–20)
CALCIUM ION: 1.04 mmol/L — AB (ref 1.13–1.30)
CHLORIDE: 104 mmol/L (ref 101–111)
CREATININE: 0.9 mg/dL (ref 0.44–1.00)
GLUCOSE: 114 mg/dL — AB (ref 65–99)
HCT: 42 % (ref 36.0–46.0)
Hemoglobin: 14.3 g/dL (ref 12.0–15.0)
Potassium: 3.2 mmol/L — ABNORMAL LOW (ref 3.5–5.1)
Sodium: 142 mmol/L (ref 135–145)
TCO2: 24 mmol/L (ref 0–100)

## 2016-01-14 SURGERY — MYRINGOTOMY WITH TUBE PLACEMENT
Anesthesia: General | Site: Ear | Laterality: Right

## 2016-01-14 MED ORDER — SUCCINYLCHOLINE CHLORIDE 20 MG/ML IJ SOLN
INTRAMUSCULAR | Status: AC
  Filled 2016-01-14: qty 1

## 2016-01-14 MED ORDER — OXYCODONE-ACETAMINOPHEN 5-325 MG PO TABS: 1.0000 | ORAL_TABLET | ORAL | Status: DC | PRN

## 2016-01-14 MED ORDER — CIPROFLOXACIN-DEXAMETHASONE 0.3-0.1 % OT SUSP
OTIC | Status: AC
  Filled 2016-01-14: qty 7.5

## 2016-01-14 MED ORDER — PROPOFOL 10 MG/ML IV BOLUS
INTRAVENOUS | Status: DC | PRN
  Administered 2016-01-14: 150 mg via INTRAVENOUS

## 2016-01-14 MED ORDER — OXYMETAZOLINE HCL 0.05 % NA SOLN
NASAL | Status: DC | PRN
  Administered 2016-01-14: 1

## 2016-01-14 MED ORDER — FENTANYL CITRATE (PF) 100 MCG/2ML IJ SOLN: 50.0000 ug | INTRAMUSCULAR | Status: DC | PRN

## 2016-01-14 MED ORDER — MIDAZOLAM HCL 2 MG/2ML IJ SOLN: 1.0000 mg | INTRAMUSCULAR | Status: DC | PRN

## 2016-01-14 MED ORDER — ATROPINE SULFATE 0.4 MG/ML IJ SOLN
INTRAMUSCULAR | Status: AC
  Filled 2016-01-14: qty 1

## 2016-01-14 MED ORDER — HYDROMORPHONE HCL 1 MG/ML IJ SOLN: 0.2500 mg | INTRAMUSCULAR | Status: DC | PRN

## 2016-01-14 MED ORDER — CIPROFLOXACIN-DEXAMETHASONE 0.3-0.1 % OT SUSP
OTIC | Status: DC | PRN
  Administered 2016-01-14: 4 [drp] via OTIC

## 2016-01-14 MED ORDER — LACTATED RINGERS IV SOLN
INTRAVENOUS | Status: DC
  Administered 2016-01-14: 08:00:00 via INTRAVENOUS

## 2016-01-14 MED ORDER — PROPOFOL 500 MG/50ML IV EMUL
INTRAVENOUS | Status: AC
  Filled 2016-01-14: qty 50

## 2016-01-14 MED ORDER — GLYCOPYRROLATE 0.2 MG/ML IJ SOLN: 0.2000 mg | Freq: Once | INTRAMUSCULAR | Status: DC | PRN

## 2016-01-14 MED ORDER — LIDOCAINE HCL (CARDIAC) 20 MG/ML IV SOLN
INTRAVENOUS | Status: AC
  Filled 2016-01-14: qty 5

## 2016-01-14 MED ORDER — SCOPOLAMINE 1 MG/3DAYS TD PT72: 1.0000 | MEDICATED_PATCH | Freq: Once | TRANSDERMAL | Status: DC | PRN

## 2016-01-14 MED ORDER — OXYCODONE HCL 5 MG PO TABS
5.0000 mg | ORAL_TABLET | Freq: Once | ORAL | Status: AC | PRN
  Administered 2016-01-14: 5 mg via ORAL

## 2016-01-14 MED ORDER — OXYCODONE HCL 5 MG/5ML PO SOLN: 5.0000 mg | Freq: Once | ORAL | Status: AC | PRN

## 2016-01-14 MED ORDER — OXYCODONE HCL 5 MG PO TABS
ORAL_TABLET | ORAL | Status: AC
  Filled 2016-01-14: qty 1

## 2016-01-14 MED ORDER — MEPERIDINE HCL 25 MG/ML IJ SOLN: 6.2500 mg | INTRAMUSCULAR | Status: DC | PRN

## 2016-01-14 SURGICAL SUPPLY — 13 items
ASPIRATOR COLLECTOR MID EAR (MISCELLANEOUS) IMPLANT
BLADE MYRINGOTOMY 45DEG STRL (BLADE) ×2 IMPLANT
CANISTER SUCT 1200ML W/VALVE (MISCELLANEOUS) ×2 IMPLANT
COTTONBALL LRG STERILE PKG (GAUZE/BANDAGES/DRESSINGS) ×2 IMPLANT
DROPPER MEDICINE STER 1.5ML LF (MISCELLANEOUS) IMPLANT
GLOVE SURG SS PI 7.0 STRL IVOR (GLOVE) ×2 IMPLANT
IV SET EXT 30 76VOL 4 MALE LL (IV SETS) ×2 IMPLANT
NS IRRIG 1000ML POUR BTL (IV SOLUTION) IMPLANT
SPONGE GAUZE 4X4 12PLY STER LF (GAUZE/BANDAGES/DRESSINGS) IMPLANT
TOWEL OR 17X24 6PK STRL BLUE (TOWEL DISPOSABLE) ×2 IMPLANT
TUBE CONNECTING 20X1/4 (TUBING) ×2 IMPLANT
TUBE EAR SHEEHY BUTTON 1.27 (OTOLOGIC RELATED) IMPLANT
TUBE EAR T MOD 1.32X4.8 BL (OTOLOGIC RELATED) ×2 IMPLANT

## 2016-01-14 NOTE — Transfer of Care (Signed)
Immediate Anesthesia Transfer of Care Note  Patient: Patricia Peck  Procedure(s) Performed: Procedure(s): RIGHT MYRINGOTOMY WITH T-TUBE PLACEMENT (Right)  Patient Location: PACU  Anesthesia Type:General  Level of Consciousness: sedated  Airway & Oxygen Therapy: Patient Spontanous Breathing and Patient connected to face mask oxygen  Post-op Assessment: Report given to RN and Post -op Vital signs reviewed and stable  Post vital signs: Reviewed and stable  Last Vitals:  Filed Vitals:   01/14/16 0755  BP: 133/86  Pulse: 79  Temp: 36.4 C  Resp: 16    Complications: No apparent anesthesia complications

## 2016-01-14 NOTE — Discharge Instructions (Signed)
Post Anesthesia Home Care Instructions  Activity: Get plenty of rest for the remainder of the day. A responsible adult should stay with you for 24 hours following the procedure.  For the next 24 hours, DO NOT: -Drive a car -Paediatric nurse -Drink alcoholic beverages -Take any medication unless instructed by your physician -Make any legal decisions or sign important papers.  Meals: Start with liquid foods such as gelatin or soup. Progress to regular foods as tolerated. Avoid greasy, spicy, heavy foods. If nausea and/or vomiting occur, drink only clear liquids until the nausea and/or vomiting subsides. Call your physician if vomiting continues.  Special Instructions/Symptoms: Your throat may feel dry or sore from the anesthesia or the breathing tube placed in your throat during surgery. If this causes discomfort, gargle with warm salt water. The discomfort should disappear within 24 hours.  If you had a scopolamine patch placed behind your ear for the management of post- operative nausea and/or vomiting:  1. The medication in the patch is effective for 72 hours, after which it should be removed.  Wrap patch in a tissue and discard in the trash. Wash hands thoroughly with soap and water. 2. You may remove the patch earlier than 72 hours if you experience unpleasant side effects which may include dry mouth, dizziness or visual disturbances. 3. Avoid touching the patch. Wash your hands with soap and water after contact with the patch.     Call your surgeon if you experience:   1.  Fever over 101.0. 2.  Inability to urinate. 3.  Nausea and/or vomiting. 4.  Extreme swelling or bruising at the surgical site. 5.  Continued bleeding from the incision. 6.  Increased pain, redness or drainage from the incision. 7.  Problems related to your pain medication. 8. Any change in color, movement and/or sensation 9. Any problems and/or concerns  --------------  POSTOPERATIVE  INSTRUCTIONS FOR PATIENTS HAVING MYRINGOTOMY AND TUBES  1. Please use the ear drops in each ear with a new tube for the next 5 days.  Use the drops as prescribed by your doctor, placing the drops into the outer opening of the ear canal with the head tilted to the opposite side. Place a clean piece of cotton into the ear after using drops. A small amount of blood tinged drainage is not uncommon for several days after the tubes are inserted. 2. Nausea and vomiting may be expected the first 6 hours after surgery. Offer liquids initially. If there is no nausea, small light meals are usually best tolerated the day of surgery. A normal diet may be resumed once nausea has passed. 3. The patient may experience mild ear discomfort the day of surgery, which is usually relieved by Tylenol. 4. A small amount of clear or blood-tinged drainage from the ears may occur a few days after surgery. If this should persists or become thick, green, yellow, or foul smelling, please contact our office at [phone]. 5. If you see clear, green, or yellow drainage from your childs ear during colds, clean the outer ear gently with a soft, damp washcloth. Begin the prescribed ear drops (4 drops, twice a day) for one week, as previously instructed.  The drainage should stop within 48 hours after starting the ear drops. If the drainage continues or becomes yellow or green, please call our office. If your child develops a fever greater than 102 F, or has and persistent bleeding from the ear(s), please call us. 6. Try to  avoid getting water in the ears. Swimming is permitted as long as there is no deep diving or swimming under water deeper than 3 feet. If you think water has gotten into the ear(s), either bathing or swimming, place 4 drops of the prescribed ear drops into the ear in question. We do recommend drops after swimming in the ocean, rivers, or lakes. 7. It is important for you to return for your scheduled appointment so that  the status of the tubes can be determined.

## 2016-01-14 NOTE — Anesthesia Postprocedure Evaluation (Signed)
Anesthesia Post Note  Patient: Patricia Peck  Procedure(s) Performed: Procedure(s) (LRB): RIGHT MYRINGOTOMY WITH T-TUBE PLACEMENT (Right)  Patient location during evaluation: PACU Anesthesia Type: General Level of consciousness: awake and alert Pain management: pain level controlled Vital Signs Assessment: post-procedure vital signs reviewed and stable Respiratory status: spontaneous breathing, nonlabored ventilation and respiratory function stable Cardiovascular status: blood pressure returned to baseline and stable Postop Assessment: no signs of nausea or vomiting Anesthetic complications: no    Last Vitals:  Filed Vitals:   01/14/16 0900 01/14/16 0908  BP: 105/60   Pulse: 105 80  Temp:    Resp: 23 14    Last Pain:  Filed Vitals:   01/14/16 0909  PainSc: 5                  Jahmere Bramel A

## 2016-01-14 NOTE — Anesthesia Preprocedure Evaluation (Signed)
Anesthesia Evaluation  Patient identified by MRN, date of birth, ID band Patient awake    Reviewed: Allergy & Precautions, NPO status , Patient's Chart, lab work & pertinent test results, reviewed documented beta blocker date and time   Airway Mallampati: I  TM Distance: >3 FB Neck ROM: Full    Dental  (+) Teeth Intact, Dental Advisory Given   Pulmonary asthma ,    breath sounds clear to auscultation       Cardiovascular hypertension, Pt. on medications and Pt. on home beta blockers + dysrhythmias Atrial Fibrillation  Rhythm:Regular Rate:Normal     Neuro/Psych    GI/Hepatic GERD  Medicated and Controlled,  Endo/Other    Renal/GU      Musculoskeletal   Abdominal   Peds  Hematology   Anesthesia Other Findings   Reproductive/Obstetrics                             Anesthesia Physical Anesthesia Plan  ASA: III  Anesthesia Plan: General   Post-op Pain Management:    Induction: Intravenous  Airway Management Planned: Mask  Additional Equipment:   Intra-op Plan:   Post-operative Plan:   Informed Consent: I have reviewed the patients History and Physical, chart, labs and discussed the procedure including the risks, benefits and alternatives for the proposed anesthesia with the patient or authorized representative who has indicated his/her understanding and acceptance.   Dental advisory given  Plan Discussed with: CRNA, Anesthesiologist and Surgeon  Anesthesia Plan Comments:         Anesthesia Quick Evaluation

## 2016-01-14 NOTE — Op Note (Signed)
DATE OF PROCEDURE:  01/14/2016                              OPERATIVE REPORT  SURGEON:  Leta Baptist, MD  PREOPERATIVE DIAGNOSES: 1. Right eustachian tube dysfunction.  POSTOPERATIVE DIAGNOSES: 1. Right eustachian tube dysfunction.  PROCEDURE PERFORMED: 1) Right myringotomy and tube placement.          ANESTHESIA:  General facemask anesthesia.  COMPLICATIONS:  None.  ESTIMATED BLOOD LOSS:  Minimal.  INDICATION FOR PROCEDURE:   Patricia Peck is a 70 y.o. female with a history of right ear discomfort and eustachian tube dysfunction. She was previously treated with systemic and topical steroids. However she continued to be symptomatic. Based on the above findings, the decision was made for the patient to undergo the right myringotomy and tube placement procedure. The risks, benefits, alternatives, and details of the procedure were discussed with the patient.  Questions were invited and answered.  Informed consent was obtained.  DESCRIPTION:  The patient was taken to the operating room and placed supine on the operating table.  General facemask anesthesia was administered by the anesthesiologist.  Under the operating microscope, the right ear canal was cleaned of all cerumen.  The tympanic membrane was noted to be intact but mildly retracted.  A standard myringotomy incision was made at the anterior-inferior quadrant on the tympanic membrane.  A T tube was placed, followed by antibiotic eardrops in the ear canal.   The care of the patient was turned over to the anesthesiologist.  The patient was awakened from anesthesia without difficulty.  The patient was transferred to the recovery room in good condition.  OPERATIVE FINDINGS:  Mild right TM retraction.  SPECIMEN:  None.  FOLLOWUP CARE:  The patient will be placed on Ciprodex eardrops 4 drops each ear b.i.d. for 5 days.  The patient will follow up in my office in approximately 4 weeks.  Graydon Fofana WOOI 01/14/2016

## 2016-01-14 NOTE — H&P (Signed)
Cc: clogging sensation in the right ear  HPI: The patient is a 70 y/o female who returns today with her husband for follow up evaluation of a clogging sensation in her right ear. She was last seen 3 weeks ago. At that time, bilateral cerumen accumulation was noted with symptoms consistent with persistent right Eustachian tube dysfunction. Bilateral high-frequency sensorineural hearing loss was noted with slight progression from her 2015 audiogram. The patient was placed on a 6 day taper of prednisone and continued on daily Dymista. The patient returns today complaining of clogging in her right ear. She noted no relief with taking the prednisone. She still has difficulty performing the Valsalva exercise to autoinsufflate her middle ear spaces. The patient also complains today of recurrent dizziness. This has been ongoing for the past several months. Dizziness is not described as room spinning, nor is there sensation that the environment is moving.  There is a general sense of lightheadedness with loss of balance. This seems to be worse when bending over and with transition activities. The patient denies any associated aural pressure, fluctuating hearing, or tinnitus. No other ENT, GI, or respiratory issue noted since the last visit.   Exam General: Communicates without difficulty, well nourished, no acute distress. Head: Normocephalic, no evidence injury, no tenderness, facial buttresses intact without stepoff. Eyes: PERRL, EOMI. No scleral icterus, conjunctivae clear. Neuro: CN II exam reveals vision grossly intact. No nystagmus at any point of gaze. Ears: Auricles well formed without lesions.  Ear canals are intact without mass or lesion.  No erythema or edema is appreciated.  The TMs are intact without fluid. Nose: External evaluation reveals normal support and skin without lesions.  Dorsum is intact.  Anterior rhinoscopy reveals healthy pink mucosa over anterior aspect of inferior turbinates and intact  septum.  No purulence noted. Oral:  Oral cavity and oropharynx are intact, symmetric, without erythema or edema.  Mucosa is moist without lesions. Neck: Full range of motion without pain.  There is no significant lymphadenopathy.  No masses palpable.  Thyroid bed within normal limits to palpation.  Parotid glands and submandibular glands equal bilaterally without mass.  Trachea is midline. Neuro:  CN 2-12 grossly intact. Gait normal. Vestibular: No nystagmus at any point of gaze. Dix Hallpike negative. The cerebellar examination is unremarkable.   Assessment 1. Persistent right Eustachian tube dysfunction.  2. The patient's dizziness is likely multifactorial, involving her peripheral sensory deficits, and other cardiovascular factors.  Her otologic and neuro- otologic examinations are unremarkable.   Plan  1. The various causes of dizziness are discussed with the patient. It is explained to the patient that her dizziness is likely secondary to central etiologies. 2. The patient is reminded that she needs to be deliberative and avoid sudden movements, especially when she tries to get out of bed.  3. Treatment options for her persistent right ear symptoms are discussed at length, which include continuing conservative management with steroid nasal spray versus right myringotomy with tube placement.  The risks, benefits, and details of the treatment modalities are discussed.  Questions are invited and answered.  Risks of myringotomy and insertion of tubes explained.  Specific mention was make of the risk of permanent hole in the ear drum, persistent ear drainage, worsening of hearing, and possible persistent clogging sensation. 4.  The patient is interested in proceeding with the procedure.  We will schedule the procedure in the office under local anesthesia.

## 2016-01-14 NOTE — Anesthesia Procedure Notes (Signed)
Date/Time: 01/14/2016 8:38 AM Performed by: Lieutenant Diego Pre-anesthesia Checklist: Patient identified, Timeout performed, Emergency Drugs available, Suction available and Patient being monitored Patient Re-evaluated:Patient Re-evaluated prior to inductionOxygen Delivery Method: Circle system utilized Intubation Type: Inhalational induction Ventilation: Mask ventilation without difficulty and Mask ventilation throughout procedure

## 2016-01-15 ENCOUNTER — Encounter (HOSPITAL_BASED_OUTPATIENT_CLINIC_OR_DEPARTMENT_OTHER): Payer: Self-pay | Admitting: Otolaryngology

## 2016-01-17 ENCOUNTER — Other Ambulatory Visit (INDEPENDENT_AMBULATORY_CARE_PROVIDER_SITE_OTHER): Payer: Medicare Other

## 2016-01-17 DIAGNOSIS — M810 Age-related osteoporosis without current pathological fracture: Secondary | ICD-10-CM | POA: Diagnosis not present

## 2016-01-17 LAB — CALCIUM: CALCIUM: 9.2 mg/dL (ref 8.4–10.5)

## 2016-01-17 NOTE — Telephone Encounter (Signed)
I have rec'd pt's insurance verification for Prolia.  Ms Lyster will have an estimated responsibility of $70.  Please make pt aware this is an estimate and we will not know an exact amt until insurance(s) has/have paid.  I have sent a copy of the summary of benefits to be scanned into pt's chart.    Since pt's calcium was too low on the 13th she is coming in today for a repeat calcium lab.  If calcium is ok, she would like to get the injection prior to Tuesday.  Once pt recs injection, please let me know actual injection date so I can update the Prolia portal.  If you have any questions, please let me know.  CC:  Barnett Applebaum and Butch Penny for ordering purposes, Ms Hodson will get the spare if calcium is w/in levels so may want to replace the spare.

## 2016-01-18 ENCOUNTER — Telehealth: Payer: Self-pay

## 2016-01-18 NOTE — Telephone Encounter (Signed)
Lm on pts vm requesting a call back. If pt returns call, pls allow her to speak with Rose before scheduling prolia injection, to review coverage. Calcium lab normal

## 2016-01-18 NOTE — Telephone Encounter (Signed)
Pt left /vm;pt wants to know if prolia injection has been approved with ins and the injection ordered and pt has nurse visit on 01/22/16. No prolia inj in refrigerator marked with [facility] name.Please advise.

## 2016-01-21 NOTE — Telephone Encounter (Signed)
Lm on pts vm requesting a call back 

## 2016-01-21 NOTE — Telephone Encounter (Signed)
See additional phone note. 

## 2016-01-21 NOTE — Telephone Encounter (Signed)
Spoke to pt and advised of $70 responsibility. Pt scheduled for 3/21 and injection is available.

## 2016-01-22 ENCOUNTER — Ambulatory Visit (INDEPENDENT_AMBULATORY_CARE_PROVIDER_SITE_OTHER): Payer: Medicare Other | Admitting: *Deleted

## 2016-01-22 DIAGNOSIS — M810 Age-related osteoporosis without current pathological fracture: Secondary | ICD-10-CM | POA: Diagnosis not present

## 2016-01-22 MED ORDER — DENOSUMAB 60 MG/ML ~~LOC~~ SOLN
60.0000 mg | Freq: Once | SUBCUTANEOUS | Status: AC
  Administered 2016-01-22: 60 mg via SUBCUTANEOUS

## 2016-03-13 ENCOUNTER — Encounter: Payer: Self-pay | Admitting: Family Medicine

## 2016-04-17 ENCOUNTER — Telehealth: Payer: Self-pay | Admitting: Family Medicine

## 2016-04-17 ENCOUNTER — Encounter: Payer: Self-pay | Admitting: Family Medicine

## 2016-04-17 ENCOUNTER — Ambulatory Visit (INDEPENDENT_AMBULATORY_CARE_PROVIDER_SITE_OTHER): Payer: Medicare Other | Admitting: Family Medicine

## 2016-04-17 VITALS — BP 120/62 | HR 60 | Temp 97.5°F | Ht 63.0 in | Wt 130.2 lb

## 2016-04-17 DIAGNOSIS — I10 Essential (primary) hypertension: Secondary | ICD-10-CM

## 2016-04-17 DIAGNOSIS — M810 Age-related osteoporosis without current pathological fracture: Secondary | ICD-10-CM | POA: Diagnosis not present

## 2016-04-17 DIAGNOSIS — I4891 Unspecified atrial fibrillation: Secondary | ICD-10-CM

## 2016-04-17 DIAGNOSIS — G4700 Insomnia, unspecified: Secondary | ICD-10-CM

## 2016-04-17 DIAGNOSIS — Z0000 Encounter for general adult medical examination without abnormal findings: Secondary | ICD-10-CM | POA: Diagnosis not present

## 2016-04-17 DIAGNOSIS — E038 Other specified hypothyroidism: Secondary | ICD-10-CM | POA: Diagnosis not present

## 2016-04-17 DIAGNOSIS — F329 Major depressive disorder, single episode, unspecified: Secondary | ICD-10-CM

## 2016-04-17 DIAGNOSIS — K219 Gastro-esophageal reflux disease without esophagitis: Secondary | ICD-10-CM

## 2016-04-17 DIAGNOSIS — F32A Depression, unspecified: Secondary | ICD-10-CM

## 2016-04-17 DIAGNOSIS — Z01419 Encounter for gynecological examination (general) (routine) without abnormal findings: Secondary | ICD-10-CM | POA: Insufficient documentation

## 2016-04-17 LAB — CBC WITH DIFFERENTIAL/PLATELET
BASOS ABS: 0.1 10*3/uL (ref 0.0–0.1)
BASOS PCT: 0.9 % (ref 0.0–3.0)
EOS ABS: 0.3 10*3/uL (ref 0.0–0.7)
Eosinophils Relative: 3.4 % (ref 0.0–5.0)
HCT: 40.6 % (ref 36.0–46.0)
Hemoglobin: 13.6 g/dL (ref 12.0–15.0)
LYMPHS ABS: 2.1 10*3/uL (ref 0.7–4.0)
Lymphocytes Relative: 27.5 % (ref 12.0–46.0)
MCHC: 33.6 g/dL (ref 30.0–36.0)
MCV: 88.5 fl (ref 78.0–100.0)
MONO ABS: 0.7 10*3/uL (ref 0.1–1.0)
Monocytes Relative: 9.7 % (ref 3.0–12.0)
NEUTROS ABS: 4.4 10*3/uL (ref 1.4–7.7)
NEUTROS PCT: 58.5 % (ref 43.0–77.0)
PLATELETS: 295 10*3/uL (ref 150.0–400.0)
RBC: 4.58 Mil/uL (ref 3.87–5.11)
RDW: 13.7 % (ref 11.5–15.5)
WBC: 7.6 10*3/uL (ref 4.0–10.5)

## 2016-04-17 LAB — COMPREHENSIVE METABOLIC PANEL
ALT: 23 U/L (ref 0–35)
AST: 22 U/L (ref 0–37)
Albumin: 4.3 g/dL (ref 3.5–5.2)
Alkaline Phosphatase: 73 U/L (ref 39–117)
BUN: 18 mg/dL (ref 6–23)
CALCIUM: 9.9 mg/dL (ref 8.4–10.5)
CHLORIDE: 103 meq/L (ref 96–112)
CO2: 31 mEq/L (ref 19–32)
Creatinine, Ser: 1.03 mg/dL (ref 0.40–1.20)
GFR: 56.17 mL/min — ABNORMAL LOW (ref >60.00)
Glucose, Bld: 104 mg/dL — ABNORMAL HIGH (ref 70–99)
POTASSIUM: 3.8 meq/L (ref 3.5–5.1)
Sodium: 141 mEq/L (ref 135–145)
TOTAL PROTEIN: 7.3 g/dL (ref 6.0–8.3)
Total Bilirubin: 0.4 mg/dL (ref 0.2–1.2)

## 2016-04-17 LAB — LIPID PANEL
Cholesterol: 175 mg/dL (ref 0–200)
HDL: 40.7 mg/dL (ref >39.00)
LDL Cholesterol: 105 mg/dL — ABNORMAL HIGH (ref 0–99)
NonHDL: 134.31
TRIGLYCERIDES: 147 mg/dL (ref 0.0–149.0)
Total CHOL/HDL Ratio: 4
VLDL: 29.4 mg/dL (ref 0.0–40.0)

## 2016-04-17 LAB — VITAMIN D 25 HYDROXY (VIT D DEFICIENCY, FRACTURES): VITD: 64.58 ng/mL (ref 30.00–100.00)

## 2016-04-17 LAB — TSH: TSH: 1.96 u[IU]/mL (ref 0.35–4.50)

## 2016-04-17 LAB — T4, FREE: Free T4: 0.75 ng/dL (ref 0.60–1.60)

## 2016-04-17 MED ORDER — METOPROLOL SUCCINATE ER 25 MG PO TB24: 25.0000 mg | ORAL_TABLET | Freq: Every day | ORAL | Status: DC

## 2016-04-17 MED ORDER — NEXIUM 40 MG PO CPDR: 40.0000 mg | DELAYED_RELEASE_CAPSULE | Freq: Every day | ORAL | Status: DC

## 2016-04-17 NOTE — Assessment & Plan Note (Signed)
The patients weight, height, BMI and visual acuity have been recorded in the chart.  Cognitive function assessed.   I have made referrals, counseling and provided education to the patient based review of the above and I have provided the pt with a written personalized care plan for preventive services.  

## 2016-04-17 NOTE — Assessment & Plan Note (Signed)
Currently taking Trazodone and Melatonin.  She feels this is working relatively well. No changes made today.

## 2016-04-17 NOTE — Assessment & Plan Note (Signed)
Well controlled. No changes made today. 

## 2016-04-17 NOTE — Telephone Encounter (Signed)
Pt left message on triage phone, medication prescribed today by PCP needs prior authorization.  [employer] approved however [place].  Patient is requesting a callback when completed  [phone]

## 2016-04-17 NOTE — Telephone Encounter (Signed)
Raquel with [place] left v/m requesting call to [phone] option 2 for Nexium PA over the phone which will give immediate approval; fax will take 5-7 days.

## 2016-04-17 NOTE — Addendum Note (Signed)
Addended by: Ellamae Sia on: 04/17/2016 09:15 AM   Modules accepted: Miquel Dunn

## 2016-04-17 NOTE — Patient Instructions (Signed)
Great to see you.  Please stop taking Dexilant, start taking Nexium 40 mg daily.  Keep me updated.  Please stop by to [facility] on your way out.  We will call you with your lab results.

## 2016-04-17 NOTE — Progress Notes (Signed)
Subjective:   Patient ID: Patricia Peck, female    DOB: [birth year], 70 y.o.   MRN: [id number]  Patricia Peck is a pleasant 70 y.o. year old female who presents to clinic today with her husband for  Annual Exam and Gastroesophageal Reflux , CPX and follow up of chronic medical conditions on 04/17/2016  HPI:  I have personally reviewed the Medicare Annual Wellness questionnaire and have noted 1. The patient's medical and social history 2. Their use of alcohol, tobacco or illicit drugs 3. Their current medications and supplements 4. The patient's functional ability including ADL's, fall risks, home safety risks and hearing or visual             impairment. 5. Diet and physical activities 6. Evidence for depression or mood disorders  End of life wishes discussed and updated in Social History.  The roster of all physicians providing medical care to patient - is listed in the CareTeams section of the chart.  Remote h/o hysterectomy Mammogram 03/12/16 Colonoscopy 07/20/14 Zostavax 04/16/2007 DEXA 04/24/14 Td 01/30/08 Pneumovax 04/08/2012 Prevnar 13 04/12/2014   Still playing tennis multiple times per week.  Insomnia- persistent and troubling issue for her. Currently taking Melatonin and trazodone 150 mg qhs.  GERD- deteriorated. Saw [facility] in 06/2015 and was switched from Nexium to [place].  Afib- was followed by Dr. Stanford Breed  Last saw him on 07/24/14- note reviewed. No changes made.  Continued on Toprol and Xarelto, along with statin.  Osteoporosis-  Receiving prolia.  Last dose 01/22/16. DEXA 04/24/14  Hypothyroidism- on synthroid 50 mcg daily. Denies any symptoms of hypo or hyperthyroidism.  Due for labs.  Lab Results  Component Value Date   TSH 2.39 04/17/2015   Lab Results  Component Value Date   WBC 8.6 04/17/2015   HGB 14.3 01/14/2016   HCT 42.0 01/14/2016   MCV 87.5 04/17/2015   PLT 312.0 04/17/2015   Lab Results  Component Value Date   CREATININE 0.90  01/14/2016    Current Outpatient Prescriptions on File Prior to Visit  Medication Sig Dispense Refill  . Alpha-D-Galactosidase (BEANO PO) Take by mouth 2 (two) times daily.    Marland Kitchen atorvastatin (LIPITOR) 20 MG tablet TAKE 1 TABLET BY MOUTH DAILY 90 tablet 1  . Bepotastine Besilate (BEPREVE) 1.5 % SOLN Use one drop in each eye twice a day    . bumetanide (BUMEX) 1 MG tablet TAKE 1 TABLET BY MOUTH 2 TIMES A DAY. 180 tablet 1  . buPROPion (WELLBUTRIN) 75 MG tablet TAKE 1 TABLET BY MOUTH TWICE A DAY. 180 tablet 1  . Calcium Carbonate-Vitamin D (CALCIUM 600/VITAMIN D) 600-400 MG-UNIT per tablet Take 1 tablet by mouth 2 (two) times daily.      . Cholecalciferol (VITAMIN D-3) 5000 UNITS TABS Take one tablet by mouth three times a week    . cyclobenzaprine (FLEXERIL) 10 MG tablet Take 10 mg by mouth 2 (two) times daily as needed. As needed for back pain     . denosumab (PROLIA) 60 MG/ML SOLN injection Inject 60 mg into the skin once. Administer in upper arm, thigh, or abdomen 180 mL 0  . diclofenac sodium (VOLTAREN) 1 % GEL Apply as needed 9 Tube 3  . famotidine (PEPCID AC) 10 MG chewable tablet Chew 10 mg by mouth daily.    . fexofenadine (ALLEGRA) 60 MG tablet Take 1 tablet (60 mg total) by mouth 2 (two) times daily. 60 tablet 3  . fluocinonide gel (LIDEX) 0.05 %  Apply topically as needed.    . fluticasone (FLONASE) 50 MCG/ACT nasal spray PLACE 1 SPRAY INTO BOTH NOSTRILS 2 TIMES DAILY. 48 g 11  . Fluticasone-Salmeterol (ADVAIR DISKUS) 100-50 MCG/DOSE AEPB Inhale 1 puff into the lungs 2 (two) times daily. 180 each 1  . levocetirizine (XYZAL) 5 MG tablet Take 1 tablet (5 mg total) by mouth every evening. 90 tablet 3  . levothyroxine (SYNTHROID, LEVOTHROID) 50 MCG tablet TAKE 1 TABLET BY MOUTH DAILY. 90 tablet 0  . MAGNESIUM PO Take 1 tablet by mouth daily.    . Melatonin 5 MG TABS Take 3 tablets my mouth at bedtime    . nystatin (MYCOSTATIN) 100000 UNIT/ML suspension SWISH BY MOUTH 10 ML UP TO 4 TIMES A  DAY 240 mL 1  . potassium chloride (K-DUR) 10 MEQ tablet TAKE 1 TABLET BY MOUTH 2 TIMES A DAY. 180 tablet 3  . traZODone (DESYREL) 50 MG tablet TAKE 3 TABLETS BY MOUTH AT BEDTIME. 270 tablet 0  . XARELTO 20 MG TABS tablet TAKE 1 TABLET BY MOUTH DAILY. 90 tablet 1   No current facility-administered medications on file prior to visit.    No Known Allergies  Past Medical History  Diagnosis Date  . Allergy   . Depression   . Hypertension   . Osteoporosis   . Hypothyroidism   . Asthma   . GERD (gastroesophageal reflux disease)   . Hyperlipidemia   . TIA (transient ischemic attack)   . Arthritis     hands  . Atrial fibrillation ([place])   . Atrial flutter ([place])   . Dysrhythmia     a-fib, on Xarelto    Past Surgical History  Procedure Laterality Date  . Partial hysterectomy  1989  . Colonoscopy    . Tee without cardioversion N/A 12/22/2012    Procedure: TRANSESOPHAGEAL ECHOCARDIOGRAM (TEE);  Surgeon: Lelon Perla, MD;  Location: [facility] ENDOSCOPY;  Service: Cardiovascular;  Laterality: N/A;  . Abdominal hysterectomy    . Myringotomy with tube placement Right 01/14/2016    Procedure: RIGHT MYRINGOTOMY WITH T-TUBE PLACEMENT;  Surgeon: Leta Baptist, MD;  Location: [facility];  Service: ENT;  Laterality: Right;    Family History  Problem Relation Age of Onset  . Breast cancer Mother   . Drug abuse Son   . Depression Son   . Stroke Paternal Aunt   . Heart disease Paternal Grandfather   . Diabetes Maternal Grandfather     Social History   Social History  . Marital Status: Married    Spouse Name: N/A  . Number of Children: 2  . Years of Education: N/A   Occupational History  .     Social History Main Topics  . Smoking status: Never Smoker   . Smokeless tobacco: Never Used  . Alcohol Use: Yes     Comment: Rare  . Drug Use: No  . Sexual Activity: Not on file   Other Topics Concern  . Not on file   Social History Narrative   Does have HPOA- sons Eddie Dibbles and  Wille Glaser.   Does desire life support if not futile.   Not sure about feeding tubes.   The PMH, PSH, Social History, Family History, Medications, and allergies have been reviewed in [facility], and have been updated if relevant.   Review of Systems  Constitutional: Negative for fatigue.  HENT: Negative.   Eyes: Negative.   Respiratory: Negative.   Cardiovascular: Negative.   Endocrine: Negative.   Genitourinary: Negative.  Musculoskeletal: Negative.   Skin: Negative.   Neurological: Negative.   Hematological: Negative.   Psychiatric/Behavioral: Positive for sleep disturbance. Negative for dysphoric mood and decreased concentration. The patient is not nervous/anxious and is not hyperactive.   All other systems reviewed and are negative.      Objective:    BP 120/62 mmHg  Pulse 60  Temp(Src) 97.5 F (36.4 C) (Oral)  Ht 5\' 3"  (1.6 m)  Wt 130 lb 4 oz (59.081 kg)  BMI 23.08 kg/m2  SpO2 97%   Physical Exam  Constitutional: She is oriented to person, place, and time. She appears well-developed and well-nourished. No distress.  HENT:  Head: Normocephalic.  Eyes: Conjunctivae are normal.  Neck: Normal range of motion.  Cardiovascular: Normal rate.   Pulmonary/Chest: Effort normal. Right breast exhibits no inverted nipple, no mass, no nipple discharge, no skin change and no tenderness. Left breast exhibits no inverted nipple, no mass, no nipple discharge, no skin change and no tenderness.  Abdominal: Hernia confirmed negative in the right inguinal area and confirmed negative in the left inguinal area.  Genitourinary: Rectum normal and vagina normal. No breast swelling, tenderness, discharge or bleeding. There is no rash, tenderness or lesion on the right labia. There is no rash, tenderness or lesion on the left labia. Right adnexum displays no mass, no tenderness and no fullness. Left adnexum displays no mass, no tenderness and no fullness.  Musculoskeletal: Normal range of motion.    Lymphadenopathy:       Right: No inguinal adenopathy present.       Left: No inguinal adenopathy present.  Neurological: She is alert and oriented to person, place, and time. No cranial nerve deficit.  Skin: Skin is warm and dry.  Psychiatric: She has a normal mood and affect. Her behavior is normal. Judgment and thought content normal.  Nursing note and vitals reviewed.         Assessment & Plan:   Medicare annual wellness visit, subsequent - Plan: Lipid panel  Osteoporosis - Plan: DG Bone Density, Vitamin D, 25-hydroxy  Insomnia  Essential hypertension - Plan: Comprehensive metabolic panel  Depression  Atrial fibrillation, unspecified type ([place])  Other specified hypothyroidism - Plan: CBC with Differential/Platelet, TSH, T4, Free  Gastroesophageal reflux disease without esophagitis  Well woman exam No Follow-up on file.

## 2016-04-17 NOTE — Progress Notes (Signed)
Pre visit review using our clinic review tool, if applicable. No additional management support is needed unless otherwise documented below in the visit note. 

## 2016-04-17 NOTE — Addendum Note (Signed)
Addended by: Lucille Passy on: 04/17/2016 08:59 AM   Modules accepted: Miquel Dunn

## 2016-04-17 NOTE — Assessment & Plan Note (Signed)
Rate controlled. Continue betablocker, xarelto.

## 2016-04-17 NOTE — Telephone Encounter (Signed)
See below

## 2016-04-17 NOTE — Assessment & Plan Note (Signed)
Reviewed preventive care protocols, scheduled due services, and updated immunizations Discussed nutrition, exercise, diet, and healthy lifestyle.  Orders Placed This Encounter  Procedures  . DG Bone Density  . CBC with Differential/Platelet  . Lipid panel  . Comprehensive metabolic panel  . TSH  . T4, Free  . Vitamin D, 25-hydroxy

## 2016-04-17 NOTE — Assessment & Plan Note (Signed)
Continue current dose of synthroid. Due for labs today. 

## 2016-04-17 NOTE — Assessment & Plan Note (Addendum)
Deteriorated. Switched from nexium to [place] in 89/2016. She would like to restart nexium.  eRx sent  Dexilant dc'd. Call or return to clinic prn if these symptoms worsen or fail to improve as anticipated.

## 2016-04-18 ENCOUNTER — Encounter: Payer: Self-pay | Admitting: *Deleted

## 2016-04-18 NOTE — Telephone Encounter (Signed)
See additional phone note. 

## 2016-04-18 NOTE — Telephone Encounter (Signed)
Spoke to [facility] and medication has been approved through June 16,2018. Fax to be received, confirming approval. Lm on pts vm and advised

## 2016-04-21 ENCOUNTER — Other Ambulatory Visit: Payer: Self-pay | Admitting: Cardiology

## 2016-04-21 ENCOUNTER — Other Ambulatory Visit: Payer: Self-pay | Admitting: Family Medicine

## 2016-04-29 ENCOUNTER — Ambulatory Visit (INDEPENDENT_AMBULATORY_CARE_PROVIDER_SITE_OTHER)
Admission: RE | Admit: 2016-04-29 | Discharge: 2016-04-29 | Disposition: A | Discharge status: Home / Self Care | Payer: Medicare Other | Source: Ambulatory Visit | Attending: Family Medicine | Admitting: Family Medicine

## 2016-04-29 DIAGNOSIS — M810 Age-related osteoporosis without current pathological fracture: Secondary | ICD-10-CM

## 2016-05-05 ENCOUNTER — Encounter: Payer: Self-pay | Admitting: *Deleted

## 2016-05-27 ENCOUNTER — Encounter: Payer: Self-pay | Admitting: Cardiology

## 2016-05-27 ENCOUNTER — Telehealth: Payer: Self-pay | Admitting: Cardiology

## 2016-06-02 NOTE — Telephone Encounter (Signed)
Closed encounter °

## 2016-06-24 ENCOUNTER — Telehealth: Payer: Self-pay | Admitting: Family Medicine

## 2016-06-24 NOTE — Telephone Encounter (Signed)
I have electronically submitted pt's info for Prolia insurance verification and will notify you once I have a response. Thank you. °

## 2016-07-04 ENCOUNTER — Ambulatory Visit: Payer: Self-pay | Admitting: Cardiology

## 2016-07-09 NOTE — Telephone Encounter (Signed)
I have rec'd Ms. Buhl's insurance verification for Prolia and she has an estimated responsibility of $0.  Please make pt aware this is an estimate and we will not know an exact amt until insurance(s) has/have paid.  I have sent a copy of the summary of benefits to be scanned into pt's chart.    Once pt recs injection, please let me know actual injection date so I can update the Prolia portal.  If you have any questions, please let me know.  Thank you!  Cc: Beatriz Stallion (for ordering purposes)

## 2016-07-10 NOTE — Telephone Encounter (Signed)
Pt left v/m returning call and request cb. 

## 2016-07-10 NOTE — Telephone Encounter (Signed)
LM on pts vm requesting a call back 

## 2016-07-14 NOTE — Telephone Encounter (Signed)
Placed her name on a box

## 2016-07-14 NOTE — Telephone Encounter (Signed)
Spoke to pt and advised of response. Ca lab scheduled.

## 2016-07-15 ENCOUNTER — Other Ambulatory Visit (INDEPENDENT_AMBULATORY_CARE_PROVIDER_SITE_OTHER): Payer: Medicare Other

## 2016-07-15 DIAGNOSIS — M810 Age-related osteoporosis without current pathological fracture: Secondary | ICD-10-CM

## 2016-07-15 LAB — CALCIUM: Calcium: 9.3 mg/dL (ref 8.4–10.5)

## 2016-07-21 NOTE — Progress Notes (Signed)
HPI: FU atrial fibrillation and TIA. Echocardiogram in January of 2014 showed normal LV function, mild left ventricular hypertrophy, mild aortic insufficiency. A patent foramen ovale could not be excluded. Brain MRI in January 2014 showed no acute infarct, mild small vessel disease. TEE in February of 2014. Her LV function was normal. There was mild mitral valve prolapse with mild mitral regurgitation. There was trace aortic insufficiency. There was no PFO. Monitor in March of 2014 showed paroxysmal atrial fibrillation/flutter. She was therefore started on anticoagulation. 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 Follow-up recommended 2 years. Since I last saw her the patient denies any dyspnea on exertion, orthopnea, PND, pedal edema, palpitations, syncope or chest pain.   Current Outpatient Prescriptions  Medication Sig Dispense Refill  . Alpha-D-Galactosidase (BEANO PO) Take by mouth 2 (two) times daily.    Marland Kitchen atorvastatin (LIPITOR) 20 MG tablet TAKE 1 TABLET BY MOUTH DAILY 90 tablet 1  . Bepotastine Besilate (BEPREVE) 1.5 % SOLN Use one drop in each eye twice a day    . bumetanide (BUMEX) 1 MG tablet TAKE 1 TABLET BY MOUTH 2 TIMES A DAY. 180 tablet 2  . buPROPion (WELLBUTRIN) 75 MG tablet TAKE 1 TABLET BY MOUTH TWICE A DAY. 180 tablet 2  . Calcium Carbonate-Vitamin D (CALCIUM 600/VITAMIN D) 600-400 MG-UNIT per tablet Take 1 tablet by mouth 2 (two) times daily.      . Cholecalciferol (VITAMIN D-3) 5000 UNITS TABS Take one tablet by mouth three times a week    . cyclobenzaprine (FLEXERIL) 10 MG tablet Take 10 mg by mouth 2 (two) times daily as needed. As needed for back pain     . denosumab (PROLIA) 60 MG/ML SOLN injection Inject 60 mg into the skin once. Administer in upper arm, thigh, or abdomen 180 mL 0  . diclofenac sodium (VOLTAREN) 1 % GEL Apply as needed 9 Tube 3  . famotidine (PEPCID AC) 10 MG  chewable tablet Chew 10 mg by mouth daily.    . fexofenadine (ALLEGRA) 60 MG tablet Take 1 tablet (60 mg total) by mouth 2 (two) times daily. 60 tablet 3  . fluocinonide gel (LIDEX) 0.05 % Apply topically as needed.    . fluticasone (FLONASE) 50 MCG/ACT nasal spray PLACE 1 SPRAY INTO BOTH NOSTRILS 2 TIMES DAILY. 48 g 11  . Fluticasone-Salmeterol (ADVAIR DISKUS) 100-50 MCG/DOSE AEPB Inhale 1 puff into the lungs 2 (two) times daily. 180 each 1  . levocetirizine (XYZAL) 5 MG tablet Take 1 tablet (5 mg total) by mouth every evening. 90 tablet 3  . levothyroxine (SYNTHROID, LEVOTHROID) 50 MCG tablet TAKE 1 TABLET BY MOUTH DAILY. 90 tablet 0  . MAGNESIUM PO Take 1 tablet by mouth daily.    . Melatonin 5 MG TABS Take 3 tablets my mouth at bedtime    . metoprolol succinate (TOPROL XL) 25 MG 24 hr tablet Take 1 tablet (25 mg total) by mouth daily. 30 tablet 3  . metoprolol succinate (TOPROL-XL) 25 MG 24 hr tablet Take 1 tablet (25 mg total) by mouth daily. Need appointment for refills. 90 tablet 0  . NEXIUM 40 MG capsule Take 1 capsule (40 mg total) by mouth daily. 90 capsule 3  . nystatin (MYCOSTATIN) 100000 UNIT/ML suspension SWISH BY MOUTH 10 ML UP TO 4 TIMES A DAY 240 mL 1  . potassium chloride (K-DUR) 10 MEQ tablet TAKE 1 TABLET BY MOUTH 2 TIMES A DAY.  180 tablet 3  . traZODone (DESYREL) 50 MG tablet TAKE 3 TABLETS BY MOUTH AT BEDTIME. 270 tablet 0  . XARELTO 20 MG TABS tablet TAKE 1 TABLET BY MOUTH DAILY. 90 tablet 1   No current facility-administered medications for this visit.      Past Medical History:  Diagnosis Date  . Allergy   . Arthritis    hands  . Asthma   . Atrial fibrillation ([place])   . Atrial flutter ([place])   . Depression   . Dysrhythmia    a-fib, on Xarelto  . GERD (gastroesophageal reflux disease)   . Hyperlipidemia   . Hypertension   . Hypothyroidism   . Osteoporosis   . TIA (transient ischemic attack)     Past Surgical History:  Procedure Laterality Date  .  ABDOMINAL HYSTERECTOMY    . COLONOSCOPY    . MYRINGOTOMY WITH TUBE PLACEMENT Right 01/14/2016   Procedure: RIGHT MYRINGOTOMY WITH T-TUBE PLACEMENT;  Surgeon: Leta Baptist, MD;  Location: [facility];  Service: ENT;  Laterality: Right;  . PARTIAL HYSTERECTOMY  1989  . TEE WITHOUT CARDIOVERSION N/A 12/22/2012   Procedure: TRANSESOPHAGEAL ECHOCARDIOGRAM (TEE);  Surgeon: Lelon Perla, MD;  Location[facility] ENDOSCOPY;  Service: Cardiovascular;  Laterality: N/A;    Social History   Social History  . Marital status: Married    Spouse name: N/A  . Number of children: 2  . Years of education: N/A   Occupational History  .  Retired   Social History Main Topics  . Smoking status: Never Smoker  . Smokeless tobacco: Never Used  . Alcohol use Yes     Comment: Rare  . Drug use: No  . Sexual activity: Not on file   Other Topics Concern  . Not on file   Social History Narrative   Does have HPOA- sons Eddie Dibbles and Wille Glaser.   Does desire life support if not futile.   Not sure about feeding tubes.    Family History  Problem Relation Age of Onset  . Breast cancer Mother   . Drug abuse Son   . Depression Son   . Stroke Paternal Aunt   . Heart disease Paternal Grandfather   . Diabetes Maternal Grandfather     ROS: no fevers or chills, productive cough, hemoptysis, dysphasia, odynophagia, melena, hematochezia, dysuria, hematuria, rash, seizure activity, orthopnea, PND, pedal edema, claudication. Remaining systems are negative.  Physical Exam: Well-developed well-nourished in no acute distress.  Skin is warm and dry.  HEENT is normal.  Neck is supple.  Chest is clear to auscultation with normal expansion.  Cardiovascular exam is regular rate and rhythm.  Abdominal exam nontender or distended. No masses palpated. Extremities show no edema. neuro grossly intact  ECG-Sinus rhythm at a rate of 74. Cannot rule out prior anterior infarct.  A/P  1 atrial fibrillation-patient remains in  sinus rhythm. Continue Toprol and xarelto.   2 cerebrovascular disease-follow-up carotid Dopplers February 2018.  3 hypertension-blood pressure controlled. Continue present medications.  Kirk Ruths, MD

## 2016-07-23 ENCOUNTER — Encounter: Payer: Self-pay | Admitting: Cardiology

## 2016-07-23 ENCOUNTER — Ambulatory Visit (INDEPENDENT_AMBULATORY_CARE_PROVIDER_SITE_OTHER): Payer: Medicare Other | Admitting: Cardiology

## 2016-07-23 VITALS — BP 114/84 | HR 74 | Ht 64.0 in | Wt 133.0 lb

## 2016-07-23 DIAGNOSIS — I6523 Occlusion and stenosis of bilateral carotid arteries: Secondary | ICD-10-CM

## 2016-07-23 DIAGNOSIS — I10 Essential (primary) hypertension: Secondary | ICD-10-CM | POA: Diagnosis not present

## 2016-07-23 DIAGNOSIS — I480 Paroxysmal atrial fibrillation: Secondary | ICD-10-CM | POA: Diagnosis not present

## 2016-07-23 NOTE — Patient Instructions (Signed)
Medication Instructions:   NO CHANGE  Testing/Procedures:  Your physician has requested that you have a carotid duplex. This test is an ultrasound of the carotid arteries in your neck. It looks at blood flow through these arteries that supply the brain with blood. Allow one hour for this exam. There are no restrictions or special instructions.DUE IN FEBRUARY     Follow-Up:  Your physician wants you to follow-up in: [place] will receive a reminder letter in the mail two months in advance. If you don't receive a letter, please call our office to schedule the follow-up appointment.   If you need a refill on your cardiac medications before your next appointment, please call your pharmacy.

## 2016-07-24 ENCOUNTER — Other Ambulatory Visit: Payer: Self-pay | Admitting: Family Medicine

## 2016-07-24 ENCOUNTER — Other Ambulatory Visit: Payer: Self-pay | Admitting: Cardiology

## 2016-07-29 ENCOUNTER — Ambulatory Visit (INDEPENDENT_AMBULATORY_CARE_PROVIDER_SITE_OTHER): Payer: Medicare Other | Admitting: *Deleted

## 2016-07-29 DIAGNOSIS — M810 Age-related osteoporosis without current pathological fracture: Secondary | ICD-10-CM

## 2016-07-29 DIAGNOSIS — Z23 Encounter for immunization: Secondary | ICD-10-CM

## 2016-07-29 DIAGNOSIS — M899 Disorder of bone, unspecified: Secondary | ICD-10-CM

## 2016-07-29 MED ORDER — DENOSUMAB 60 MG/ML ~~LOC~~ SOLN
60.0000 mg | Freq: Once | SUBCUTANEOUS | Status: AC
  Administered 2016-07-29: 60 mg via SUBCUTANEOUS

## 2016-07-30 ENCOUNTER — Encounter: Payer: Self-pay | Admitting: Family Medicine

## 2016-07-30 ENCOUNTER — Ambulatory Visit (INDEPENDENT_AMBULATORY_CARE_PROVIDER_SITE_OTHER): Payer: Medicare Other | Admitting: Family Medicine

## 2016-07-30 VITALS — BP 120/88 | HR 66 | Wt 130.0 lb

## 2016-07-30 DIAGNOSIS — S239XXA Sprain of unspecified parts of thorax, initial encounter: Secondary | ICD-10-CM | POA: Diagnosis not present

## 2016-07-30 MED ORDER — CYCLOBENZAPRINE HCL 10 MG PO TABS
ORAL_TABLET | ORAL | 0 refills | Status: DC
Start: 2016-07-30 — End: 2017-08-05

## 2016-07-30 NOTE — Patient Instructions (Signed)
For pain, try tylenol, heat, gentle exercises  Thoracic Strain A thoracic strain, which is sometimes called a mid-back strain, is an injury to the muscles or tendons that attach to the upper part of your back behind your chest. This type of injury occurs when a muscle is overstretched or overloaded.  Thoracic strains can range from mild to severe. Mild strains may involve stretching a muscle or tendon without tearing it. These injuries may heal in 1-2 weeks. More severe strains involve tearing of muscle fibers or tendons. These will cause more pain and may take 6-8 weeks to heal. CAUSES This condition may be caused by:  An injury in which a sudden force is placed on the muscle.  Exercising without properly warming up.  Overuse of the muscle.  Improper form during certain movements.  Other injuries that surround or cause stress on the mid-back, causing a strain on the muscles. In some cases, the cause may not be known. RISK FACTORS This injury is more common in:  Athletes.  People with obesity. SYMPTOMS The main symptom of this condition is pain, especially with movement. Other symptoms include:  Bruising.  Swelling.  Spasm. DIAGNOSIS This condition may be diagnosed with a physical exam. X-rays may be taken to check for a fracture. TREATMENT This condition may be treated with:  Resting and icing the injured area.  Physical therapy. This will involve doing stretching and strengthening exercises.  Medicines for pain and inflammation. HOME CARE INSTRUCTIONS  Rest as needed. Follow instructions from your health care provider about any restrictions on activity.  If directed, apply ice to the injured area:  Put ice in a plastic bag.  Place a towel between your skin and the bag.  Leave the ice on for 20 minutes, 2-3 times per day.  Take over-the-counter and prescription medicines only as told by your health care provider.  Begin doing exercises as told by your health  care provider or physical therapist.  Always warm up properly before physical activity or sports.  Bend your knees before you lift heavy objects.  Keep all follow-up visits as told by your health care provider. This is important. SEEK MEDICAL CARE IF:  Your pain is not helped by medicine.  Your pain, bruising, or swelling is getting worse.  You have a fever. SEEK IMMEDIATE MEDICAL CARE IF:  You have shortness of breath.  You have chest pain.  You develop numbness or weakness in your legs.  You have involuntary loss of urine (urinary incontinence).   This information is not intended to replace advice given to you by your health care provider. Make sure you discuss any questions you have with your health care provider.   Document Released: 01/10/2004 Document Revised: 07/11/2015 Document Reviewed: 12/14/2014 Elsevier Interactive Patient Education [employer].

## 2016-07-30 NOTE — Progress Notes (Signed)
Subjective:     Patient ID: Patricia Peck, female   DOB: 1946-06-02, 70 y.o.   MRN: [id number]  HPI This is a 70 yo female, accompanied by her husband, who presents today with back pain for 3 days. Had played golf earlier that day. The next day she had a lot of pain if she sneezed, requiring her to put counter pressure on the area. Pain has gotten better over the last 3 days. She is headed out of town for a week starting tomorrow. No neck pain or headache, no tingling/numbness in arms or legs, no recent falls. No loss of bowel or bladder function. Has not taken any medication for pain. TAkes Xarelto for AFib.   Past Medical History:  Diagnosis Date  . Allergy   . Arthritis    hands  . Asthma   . Atrial fibrillation ([place])   . Atrial flutter ([place])   . Depression   . Dysrhythmia    a-fib, on Xarelto  . GERD (gastroesophageal reflux disease)   . Hyperlipidemia   . Hypertension   . Hypothyroidism   . Osteoporosis   . TIA (transient ischemic attack)    Past Surgical History:  Procedure Laterality Date  . ABDOMINAL HYSTERECTOMY    . COLONOSCOPY    . MYRINGOTOMY WITH TUBE PLACEMENT Right 01/14/2016   Procedure: RIGHT MYRINGOTOMY WITH T-TUBE PLACEMENT;  Surgeon: Leta Baptist, MD;  Location: [place];  Service: ENT;  Laterality: Right;  . PARTIAL HYSTERECTOMY  1989  . TEE WITHOUT CARDIOVERSION N/A 12/22/2012   Procedure: TRANSESOPHAGEAL ECHOCARDIOGRAM (TEE);  Surgeon: Lelon Perla, MD;  Location: [place] [facility] ENDOSCOPY;  Service: Cardiovascular;  Laterality: N/A;   Family History  Problem Relation Age of Onset  . Breast cancer Mother   . Drug abuse Son   . Depression Son   . Stroke Paternal Aunt   . Heart disease Paternal Grandfather   . Diabetes Maternal Grandfather      Review of Systems Per HPI    Objective:   Physical Exam  Constitutional: She is oriented to person, place, and time. She appears well-developed and well-nourished. No distress.  HENT:  Head:  Normocephalic and atraumatic.  Eyes: Conjunctivae and EOM are normal. Pupils are equal, round, and reactive to light. Right eye exhibits no discharge. Left eye exhibits no discharge. No scleral icterus.  Neck: Normal range of motion. Neck supple.  Cardiovascular: Normal rate, regular rhythm and normal heart sounds.   Pulmonary/Chest: Effort normal and breath sounds normal.  Musculoskeletal: Normal range of motion.       Cervical back: Normal.       Thoracic back: She exhibits tenderness (Right sided subscapular. ). She exhibits normal range of motion, no bony tenderness, no swelling and no edema.       Lumbar back: Normal.  No rash or erythema.   Neurological: She is alert and oriented to person, place, and time. She has normal reflexes. No cranial nerve deficit.  Skin: Skin is warm and dry. She is not diaphoretic.  Psychiatric: She has a normal mood and affect. Her behavior is normal. Judgment and thought content normal.  Vitals reviewed.     BP 120/88   Pulse 66   Wt 130 lb (59 kg)   SpO2 96%   BMI 22.31 kg/m  Wt Readings from Last 3 Encounters:  07/30/16 130 lb (59 kg)  07/23/16 133 lb (60.3 kg)  04/17/16 130 lb 4 oz (59.1 kg)     Assessment:  Thoracic sprain, initial encounter - Plan: cyclobenzaprine (FLEXERIL) 10 MG tablet      Plan:     1. Thoracic sprain, initial encounter - Provided written and verbal information regarding diagnosis and treatment. - for pain- tylenol bid-tid, heat prn, gentle range of motion several times a day - cyclobenzaprine (FLEXERIL) 10 MG tablet; Take 0.5 to 1 tablet at bedtime as needed for muscle spasm  Dispense: 20 tablet; Refill: 0- discussed potential side effects - RTC precautions reviewed   Clarene Reamer, FNP-BC  [facility]  07/30/2016 4:02 PM

## 2016-08-04 NOTE — Telephone Encounter (Signed)
There was a note placed in my in-box stating patient wanted to know about her Prolia and requested a c/b.

## 2016-08-04 NOTE — Telephone Encounter (Signed)
Spoke to [facility] and advised her pt is contacting her to start prolia approval process for the next upcoming injection

## 2016-09-23 ENCOUNTER — Encounter: Payer: Self-pay | Admitting: Primary Care

## 2016-09-23 ENCOUNTER — Ambulatory Visit (INDEPENDENT_AMBULATORY_CARE_PROVIDER_SITE_OTHER): Payer: Medicare Other | Admitting: Primary Care

## 2016-09-23 VITALS — BP 120/84 | HR 56 | Temp 97.8°F | Wt 132.4 lb

## 2016-09-23 DIAGNOSIS — J069 Acute upper respiratory infection, unspecified: Secondary | ICD-10-CM

## 2016-09-23 MED ORDER — HYDROCOD POLST-CPM POLST ER 10-8 MG/5ML PO SUER: 5.0000 mL | Freq: Two times a day (BID) | ORAL | 0 refills | Status: DC | PRN

## 2016-09-23 MED ORDER — AMOXICILLIN-POT CLAVULANATE 875-125 MG PO TABS: 1.0000 | ORAL_TABLET | Freq: Two times a day (BID) | ORAL | 0 refills | Status: DC

## 2016-09-23 NOTE — Progress Notes (Signed)
Pre visit review using our clinic review tool, if applicable. No additional management support is needed unless otherwise documented below in the visit note. 

## 2016-09-23 NOTE — Patient Instructions (Signed)
Start Augmentin antibiotics. Take 1 tablet by mouth twice daily for 10 days.  You may take the Tussionex cough suppressant twice daily as needed for cough and rest. Caution this medication contains codeine and will make you feel drowsy.  Ensure you are staying hydrated with water.  It was a pleasure meeting you!

## 2016-09-23 NOTE — Progress Notes (Signed)
Subjective:    Patient ID: Patricia Peck, female    DOB: [birth year], 70 y.o.   MRN: [id number]  HPI  Patricia Peck is a 70 year old female who presents today with a chief complaint of nasal congestion. She also reports cough, sore throat, post nasal drip, fatigue. Her symptoms have been present for the past 1.5 weeks which began while on a cruise ship. Her cough is productive with green sputum. She's been taking Allegra BID, Flonase, and Xyzal without much improvement. She denies fevers.  Review of Systems  Constitutional: Positive for chills and fatigue. Negative for fever.  HENT: Positive for congestion, postnasal drip and sore throat.   Respiratory: Positive for cough. Negative for shortness of breath.   Cardiovascular: Negative for chest pain.  Gastrointestinal: Negative for nausea.       Past Medical History:  Diagnosis Date  . Allergy   . Arthritis    hands  . Asthma   . Atrial fibrillation ([place])   . Atrial flutter ([place])   . Depression   . Dysrhythmia    a-fib, on Xarelto  . GERD (gastroesophageal reflux disease)   . Hyperlipidemia   . Hypertension   . Hypothyroidism   . Osteoporosis   . TIA (transient ischemic attack)      Social History   Social History  . Marital status: Married    Spouse name: N/A  . Number of children: 2  . Years of education: N/A   Occupational History  .  Retired   Social History Main Topics  . Smoking status: Never Smoker  . Smokeless tobacco: Never Used  . Alcohol use Yes     Comment: Rare  . Drug use: No  . Sexual activity: Not on file   Other Topics Concern  . Not on file   Social History Narrative   Does have HPOA- sons Patricia Peck and Patricia Peck.   Does desire life support if not futile.   Not sure about feeding tubes.    Past Surgical History:  Procedure Laterality Date  . ABDOMINAL HYSTERECTOMY    . COLONOSCOPY    . MYRINGOTOMY WITH TUBE PLACEMENT Right 01/14/2016   Procedure: RIGHT MYRINGOTOMY WITH T-TUBE PLACEMENT;   Surgeon: Leta Baptist, MD;  Location: [place];  Service: ENT;  Laterality: Right;  . PARTIAL HYSTERECTOMY  1989  . TEE WITHOUT CARDIOVERSION N/A 12/22/2012   Procedure: TRANSESOPHAGEAL ECHOCARDIOGRAM (TEE);  Surgeon: Lelon Perla, MD;  Location: [facility] ENDOSCOPY;  Service: Cardiovascular;  Laterality: N/A;    Family History  Problem Relation Age of Onset  . Breast cancer Mother   . Drug abuse Son   . Depression Son   . Stroke Paternal Aunt   . Heart disease Paternal Grandfather   . Diabetes Maternal Grandfather     No Known Allergies  Current Outpatient Prescriptions on File Prior to Visit  Medication Sig Dispense Refill  . Alpha-D-Galactosidase (BEANO PO) Take by mouth 2 (two) times daily.    Marland Kitchen atorvastatin (LIPITOR) 20 MG tablet TAKE 1 TABLET BY MOUTH DAILY 90 tablet 2  . Bepotastine Besilate (BEPREVE) 1.5 % SOLN Use one drop in each eye twice a day    . bumetanide (BUMEX) 1 MG tablet TAKE 1 TABLET BY MOUTH 2 TIMES A DAY. 180 tablet 2  . buPROPion (WELLBUTRIN) 75 MG tablet TAKE 1 TABLET BY MOUTH TWICE A DAY. 180 tablet 2  . Calcium Carbonate-Vitamin D (CALCIUM 600/VITAMIN D) 600-400 MG-UNIT per tablet Take 1 tablet  by mouth 2 (two) times daily.      . Cholecalciferol (VITAMIN D-3) 5000 UNITS TABS Take one tablet by mouth three times a week    . cyclobenzaprine (FLEXERIL) 10 MG tablet Take 0.5 to 1 tablet at bedtime as needed for muscle spasm 20 tablet 0  . denosumab (PROLIA) 60 MG/ML SOLN injection Inject 60 mg into the skin once. Administer in upper arm, thigh, or abdomen 180 mL 0  . diclofenac sodium (VOLTAREN) 1 % GEL Apply as needed 9 Tube 3  . famotidine (PEPCID AC) 10 MG chewable tablet Chew 10 mg by mouth daily.    . fexofenadine (ALLEGRA) 60 MG tablet Take 1 tablet (60 mg total) by mouth 2 (two) times daily. 60 tablet 3  . fluocinonide gel (LIDEX) 0.05 % Apply topically as needed.    . fluticasone (FLONASE) 50 MCG/ACT nasal spray PLACE 1 SPRAY INTO BOTH NOSTRILS  2 TIMES DAILY. 48 g 11  . Fluticasone-Salmeterol (ADVAIR DISKUS) 100-50 MCG/DOSE AEPB Inhale 1 puff into the lungs 2 (two) times daily. 180 each 1  . levocetirizine (XYZAL) 5 MG tablet Take 1 tablet (5 mg total) by mouth every evening. 90 tablet 3  . levothyroxine (SYNTHROID, LEVOTHROID) 50 MCG tablet TAKE 1 TABLET BY MOUTH DAILY. 90 tablet 1  . MAGNESIUM PO Take 1 tablet by mouth daily.    . Melatonin 5 MG TABS Take 3 tablets my mouth at bedtime    . metoprolol succinate (TOPROL XL) 25 MG 24 hr tablet Take 1 tablet (25 mg total) by mouth daily. 30 tablet 3  . metoprolol succinate (TOPROL-XL) 25 MG 24 hr tablet Take 1 tablet (25 mg total) by mouth daily. Need appointment for refills. 90 tablet 0  . NEXIUM 40 MG capsule Take 1 capsule (40 mg total) by mouth daily. 90 capsule 3  . nystatin (MYCOSTATIN) 100000 UNIT/ML suspension SWISH BY MOUTH 10 ML UP TO 4 TIMES A DAY 240 mL 1  . potassium chloride (K-DUR) 10 MEQ tablet TAKE 1 TABLET BY MOUTH 2 TIMES A DAY. 180 tablet 2  . traZODone (DESYREL) 50 MG tablet TAKE 3 TABLETS BY MOUTH AT BEDTIME. 270 tablet 1  . XARELTO 20 MG TABS tablet TAKE 1 TABLET BY MOUTH DAILY. 90 tablet 2   No current facility-administered medications on file prior to visit.     BP 120/84   Pulse (!) 56   Temp 97.8 F (36.6 C) (Oral)   Wt 132 lb 6.4 oz (60.1 kg)   SpO2 97%   BMI 22.73 kg/m     Objective:   Physical Exam  Constitutional: She appears well-nourished. She appears ill.  HENT:  Right Ear: Tympanic membrane and ear canal normal.  Left Ear: Tympanic membrane and ear canal normal.  Nose: Mucosal edema present. Right sinus exhibits no maxillary sinus tenderness and no frontal sinus tenderness. Left sinus exhibits no maxillary sinus tenderness and no frontal sinus tenderness.  Mouth/Throat: Oropharynx is clear and moist.  Eyes: Conjunctivae are normal.  Neck: Neck supple.  Cardiovascular: Normal rate and regular rhythm.   Pulmonary/Chest: Effort normal. She  has no wheezes. She has rhonchi in the right upper field and the left upper field. She has no rales.  Lymphadenopathy:    She has no cervical adenopathy.  Skin: Skin is warm and dry.          Assessment & Plan:  URI:  Cough, congestion, fatigue x 10 days. Now with productive cough with green sputum. No improvement  with OTC treatment. Exam with mild rhonchi to upper fields, appears ill but not toxic. Given presentation and duration of symptoms, will treat for presumed bacterial involvement. Rx for Augmentin sent to pharmacy. Rx for Tussionex printed for HS. Fluids, rest, Mucinex. Follow up PRN.  Sheral Flow, NP

## 2016-10-20 ENCOUNTER — Other Ambulatory Visit: Payer: Self-pay | Admitting: Cardiology

## 2016-12-16 ENCOUNTER — Telehealth: Payer: Self-pay

## 2016-12-16 NOTE — Telephone Encounter (Signed)
Pt left v/m; last prolia injection 07/29/16 and pt wants to know when to start the process for ordering the next prolia shot.Please advise.

## 2016-12-17 NOTE — Telephone Encounter (Signed)
Spoke to pt's husband and advised I have updated the prolia folder to reflect her being due next month. I will process all needed paperwork and contact her back once the summary of benefits have been received.

## 2016-12-19 ENCOUNTER — Telehealth: Payer: Self-pay | Admitting: *Deleted

## 2016-12-19 DIAGNOSIS — M810 Age-related osteoporosis without current pathological fracture: Secondary | ICD-10-CM

## 2016-12-19 NOTE — Telephone Encounter (Signed)
Information has been submitted to pts insurance for verification of benefits. Awaiting response for coverage  

## 2016-12-24 ENCOUNTER — Ambulatory Visit (HOSPITAL_COMMUNITY)
Admission: RE | Admit: 2016-12-24 | Discharge: 2016-12-24 | Disposition: A | Discharge status: Home / Self Care | Payer: Medicare Other | Source: Ambulatory Visit | Attending: Cardiology | Admitting: Cardiology

## 2016-12-24 DIAGNOSIS — I6523 Occlusion and stenosis of bilateral carotid arteries: Secondary | ICD-10-CM | POA: Diagnosis not present

## 2016-12-25 ENCOUNTER — Telehealth: Payer: Self-pay | Admitting: Family Medicine

## 2016-12-25 ENCOUNTER — Ambulatory Visit (INDEPENDENT_AMBULATORY_CARE_PROVIDER_SITE_OTHER): Payer: Medicare Other | Admitting: Emergency Medicine

## 2016-12-25 VITALS — BP 120/80 | HR 82 | Temp 98.2°F | Ht 63.0 in | Wt 132.0 lb

## 2016-12-25 DIAGNOSIS — F439 Reaction to severe stress, unspecified: Secondary | ICD-10-CM | POA: Diagnosis not present

## 2016-12-25 DIAGNOSIS — M62838 Other muscle spasm: Secondary | ICD-10-CM | POA: Diagnosis not present

## 2016-12-25 DIAGNOSIS — L089 Local infection of the skin and subcutaneous tissue, unspecified: Secondary | ICD-10-CM

## 2016-12-25 MED ORDER — CEPHALEXIN 500 MG PO CAPS: 500.0000 mg | ORAL_CAPSULE | Freq: Three times a day (TID) | ORAL | 0 refills | Status: AC

## 2016-12-25 MED ORDER — CLONAZEPAM 0.5 MG PO TABS
0.5000 mg | ORAL_TABLET | Freq: Two times a day (BID) | ORAL | 1 refills | Status: DC | PRN
Start: 2016-12-25 — End: 2017-02-10

## 2016-12-25 NOTE — Telephone Encounter (Signed)
Pt has appt at [facility] walk in 12/25/16 at 12 noon.

## 2016-12-25 NOTE — Progress Notes (Addendum)
Patricia Peck 71 y.o.   Chief Complaint  Patient presents with  . Neck Pain    3 days ago - across lower, posterior C/Spine  . skin inspection    Pt ? a pimple or does she need to see a dermatologist.      HISTORY OF PRESENT ILLNESS: This is a 71 y.o. female complaining of upper back muscle spasm and pain. Under increased stress at home from neighbors.  Neck Pain   This is a new problem. The current episode started in the past 7 days. The problem occurs constantly. The problem has been waxing and waning. The pain is associated with an unknown factor. The pain is present in the right side and left side. The quality of the pain is described as aching. The pain is at a severity of 5/10. The pain is moderate. The symptoms are aggravated by position. The pain is same all the time. Pertinent negatives include no chest pain, fever, headaches, numbness, pain with swallowing, syncope, tingling, trouble swallowing, visual change, weakness or weight loss. She has tried nothing for the symptoms.     Prior to Admission medications   Medication Sig Start Date End Date Taking? Authorizing Provider  Alpha-D-Galactosidase (BEANO PO) Take by mouth 2 (two) times daily.   Yes Historical Provider, MD  atorvastatin (LIPITOR) 20 MG tablet TAKE 1 TABLET BY MOUTH DAILY 07/25/16  Yes Lucille Passy, MD  Bepotastine Besilate (BEPREVE) 1.5 % SOLN Use one drop in each eye twice a day   Yes Historical Provider, MD  bumetanide (BUMEX) 1 MG tablet TAKE 1 TABLET BY MOUTH 2 TIMES A DAY. 04/21/16  Yes Lucille Passy, MD  buPROPion (WELLBUTRIN) 75 MG tablet TAKE 1 TABLET BY MOUTH TWICE A DAY. Patient taking differently: TAKE 1 TABLET BY MOUTH Daily 04/21/16  Yes Lucille Passy, MD  Calcium Carbonate-Vitamin D (CALCIUM 600/VITAMIN D) 600-400 MG-UNIT per tablet Take 1 tablet by mouth 2 (two) times daily.     Yes Historical Provider, MD  Cholecalciferol (VITAMIN D-3) 5000 UNITS TABS Take one tablet by mouth three times a week   Yes  Historical Provider, MD  denosumab (PROLIA) 60 MG/ML SOLN injection Inject 60 mg into the skin once. Administer in upper arm, thigh, or abdomen 08/06/15  Yes Lucille Passy, MD  diclofenac sodium (VOLTAREN) 1 % GEL Apply as needed 08/24/13  Yes Owens Loffler, MD  famotidine (PEPCID AC) 10 MG chewable tablet Chew 10 mg by mouth daily.   Yes Historical Provider, MD  fexofenadine (ALLEGRA) 60 MG tablet Take 1 tablet (60 mg total) by mouth 2 (two) times daily. 08/10/13  Yes Lucille Passy, MD  Fluticasone-Salmeterol (ADVAIR DISKUS) 100-50 MCG/DOSE AEPB Inhale 1 puff into the lungs 2 (two) times daily. 08/24/13  Yes Spencer Copland, MD  levothyroxine (SYNTHROID, LEVOTHROID) 50 MCG tablet TAKE 1 TABLET BY MOUTH DAILY. 07/25/16  Yes Lucille Passy, MD  MAGNESIUM PO Take 1 tablet by mouth daily.   Yes Historical Provider, MD  Melatonin 5 MG TABS Take 3 tablets my mouth at bedtime   Yes Historical Provider, MD  metoprolol succinate (TOPROL-XL) 25 MG 24 hr tablet TAKE 1 TABLET BY MOUTH DAILY. NEED APPOINTMENT FOR REFILLS. Patient taking differently: TAKE 1 TABLET BY MOUTH DAILY. 10/20/16  Yes Lelon Perla, MD  NEXIUM 40 MG capsule Take 1 capsule (40 mg total) by mouth daily. 04/17/16  Yes Lucille Passy, MD  potassium chloride (K-DUR) 10 MEQ tablet TAKE 1 TABLET  BY MOUTH 2 TIMES A DAY. 07/25/16  Yes Lucille Passy, MD  traZODone (DESYREL) 50 MG tablet TAKE 3 TABLETS BY MOUTH AT BEDTIME. 07/25/16  Yes Lucille Passy, MD  XARELTO 20 MG TABS tablet TAKE 1 TABLET BY MOUTH DAILY. 07/24/16  Yes Lelon Perla, MD  clonazePAM (KLONOPIN) 0.5 MG tablet Take 1 tablet (0.5 mg total) by mouth 2 (two) times daily as needed for anxiety. 12/25/16   Horald Pollen, MD  cyclobenzaprine (FLEXERIL) 10 MG tablet Take 0.5 to 1 tablet at bedtime as needed for muscle spasm Patient not taking: Reported on 12/25/2016 07/30/16   Elby Beck, FNP  nystatin (MYCOSTATIN) 100000 UNIT/ML suspension SWISH BY MOUTH 10 ML UP TO 4 TIMES A  DAY Patient not taking: Reported on 12/25/2016 08/24/13   Owens Loffler, MD    No Known Allergies  Patient Active Problem List   Diagnosis Date Noted  . Well woman exam 04/17/2016  . Medicare annual wellness visit, subsequent 04/17/2015  . IBS (irritable bowel syndrome) 04/11/2013  . Atrial fibrillation ([place]) 01/27/2013  . Cerebrovascular disease 01/27/2013  . TIA (transient ischemic attack) 12/14/2012  . Insomnia 11/02/2012  . GERD 03/12/2010  . Hypothyroidism 04/14/2007  . Depression 04/14/2007  . Essential hypertension 04/14/2007  . Osteoporosis 04/14/2007    Past Medical History:  Diagnosis Date  . Allergy   . Arthritis    hands  . Asthma   . Atrial fibrillation ([place])   . Atrial flutter ([place])   . Depression   . Dysrhythmia    a-fib, on Xarelto  . GERD (gastroesophageal reflux disease)   . Hyperlipidemia   . Hypertension   . Hypothyroidism   . Osteoporosis   . TIA (transient ischemic attack)     Past Surgical History:  Procedure Laterality Date  . ABDOMINAL HYSTERECTOMY    . COLONOSCOPY    . MYRINGOTOMY WITH TUBE PLACEMENT Right 01/14/2016   Procedure: RIGHT MYRINGOTOMY WITH T-TUBE PLACEMENT;  Surgeon: Leta Baptist, MD;  Location: [place];  Service: ENT;  Laterality: Right;  . PARTIAL HYSTERECTOMY  1989  . TEE WITHOUT CARDIOVERSION N/A 12/22/2012   Procedure: TRANSESOPHAGEAL ECHOCARDIOGRAM (TEE);  Surgeon: Lelon Perla, MD;  Location: [facility] ENDOSCOPY;  Service: Cardiovascular;  Laterality: N/A;    Social History   Social History  . Marital status: Married    Spouse name: N/A  . Number of children: 2  . Years of education: N/A   Occupational History  .  Retired   Social History Main Topics  . Smoking status: Never Smoker  . Smokeless tobacco: Never Used  . Alcohol use Yes     Comment: Rare  . Drug use: No  . Sexual activity: Not on file   Other Topics Concern  . Not on file   Social History Narrative   Does have HPOA- sons Eddie Dibbles  and Wille Glaser.   Does desire life support if not futile.   Not sure about feeding tubes.    Family History  Problem Relation Age of Onset  . Breast cancer Mother   . Drug abuse Son   . Depression Son   . Stroke Paternal Aunt   . Heart disease Paternal Grandfather   . Diabetes Maternal Grandfather      Review of Systems  Constitutional: Negative for fever and weight loss.  HENT: Negative.  Negative for trouble swallowing.   Eyes: Negative.   Respiratory: Negative for cough, shortness of breath and wheezing.   Cardiovascular:  Negative for chest pain, palpitations and syncope.  Gastrointestinal: Negative for abdominal pain, diarrhea, nausea and vomiting.  Genitourinary: Negative for dysuria and hematuria.  Musculoskeletal: Positive for neck pain. Negative for myalgias.  Skin: Negative for rash.  Neurological: Negative for tingling, weakness, numbness and headaches.  All other systems reviewed and are negative.  Vitals:   12/25/16 1210  BP: 120/80  Pulse: 82  Temp: 98.2 F (36.8 C)     Physical Exam  Constitutional: She is oriented to person, place, and time. She appears well-developed and well-nourished.  HENT:  Head: Normocephalic and atraumatic.  Nose: Nose normal.  Mouth/Throat: Oropharynx is clear and moist.  Eyes: Conjunctivae and EOM are normal. Pupils are equal, round, and reactive to light.  Neck: Muscular tenderness (trapezius) present. No spinous process tenderness present. Decreased range of motion present. No edema and no erythema present.  Cardiovascular: Normal rate, regular rhythm and normal heart sounds.   Pulmonary/Chest: Effort normal and breath sounds normal.  Abdominal: Soft.  Neurological: She is alert and oriented to person, place, and time. She displays normal reflexes. No sensory deficit. She exhibits normal muscle tone.  Skin: Skin is warm and dry. Capillary refill takes less than 2 seconds.  Left lower neck area: infected pimple with swelling and  erythema  Psychiatric: She has a normal mood and affect. Her behavior is normal.     ASSESSMENT & PLAN: Patricia Peck was seen today for neck pain and skin inspection.  Diagnoses and all orders for this visit:  Neck muscle spasm  Stress  Other orders -     clonazePAM (KLONOPIN) 0.5 MG tablet; Take 1 tablet (0.5 mg total) by mouth 2 (two) times daily as needed for anxiety.    Patient Instructions       IF you received an x-ray today, you will receive an invoice from [facility]. Please contact [facility] at [phone] with questions or concerns regarding your invoice.   IF you received labwork today, you will receive an invoice from [facility]. Please contact [facility] at [phone] with questions or concerns regarding your invoice.   Our billing staff will not be able to assist you with questions regarding bills from these companies.  You will be contacted with the lab results as soon as they are available. The fastest way to get your results is to activate your My Chart account. Instructions are located on the last page of this paperwork. If you have not heard from [place] regarding the results in 2 weeks, please contact this office.      Acute Torticollis Torticollis is a condition in which the muscles of the neck tighten (contract) abnormally, causing the neck to twist and the head to move into an unnatural position. Torticollis that develops suddenly is called acute torticollis. If torticollis becomes chronic and is left untreated, the face and neck can become deformed. CAUSES This condition may be caused by:  Sleeping in an awkward position (common).  Extending or twisting the neck muscles beyond their normal position.  Infection. In some cases, the cause may not be known. SYMPTOMS Symptoms of this condition include:  An unnatural position of the head.  Neck pain.  A limited ability to move the neck.  Twisting of the neck to one  side. DIAGNOSIS This condition is diagnosed with a physical exam. You may also have imaging tests, such as an X-ray, CT scan, or MRI. TREATMENT Treatment for this condition involves trying to relax the neck muscles. It may include:  Medicines  or shots.  Physical therapy.  Surgery. This may be done in severe cases. HOME CARE INSTRUCTIONS  Take medicines only as directed by your health care provider.  Do stretching exercises and massage your neck as directed by your health care provider.  Keep all follow-up visits as directed by your health care provider. This is important. SEEK MEDICAL CARE IF:  You develop a fever. SEEK IMMEDIATE MEDICAL CARE IF:  You develop difficulty breathing.  You develop noisy breathing (stridor).  You start drooling.  You have trouble swallowing or have pain with swallowing.  You develop numbness or weakness in your hands or feet.  You have changes in your speech, understanding, or vision.  Your pain gets worse. This information is not intended to replace advice given to you by your health care provider. Make sure you discuss any questions you have with your health care provider. Document Released: 10/17/2000 Document Revised: 02/11/2016 Document Reviewed: 10/16/2014 Elsevier Interactive Patient Education  2017 [employer].      Agustina Caroli, MD [facility]

## 2016-12-25 NOTE — Patient Instructions (Addendum)
     IF you received an x-ray today, you will receive an invoice from [facility]. Please contact [facility] at [phone] with questions or concerns regarding your invoice.   IF you received labwork today, you will receive an invoice from [facility]. Please contact [facility] at [phone] with questions or concerns regarding your invoice.   Our billing staff will not be able to assist you with questions regarding bills from these companies.  You will be contacted with the lab results as soon as they are available. The fastest way to get your results is to activate your My Chart account. Instructions are located on the last page of this paperwork. If you have not heard from [place] regarding the results in 2 weeks, please contact this office.      Acute Torticollis Torticollis is a condition in which the muscles of the neck tighten (contract) abnormally, causing the neck to twist and the head to move into an unnatural position. Torticollis that develops suddenly is called acute torticollis. If torticollis becomes chronic and is left untreated, the face and neck can become deformed. CAUSES This condition may be caused by:  Sleeping in an awkward position (common).  Extending or twisting the neck muscles beyond their normal position.  Infection. In some cases, the cause may not be known. SYMPTOMS Symptoms of this condition include:  An unnatural position of the head.  Neck pain.  A limited ability to move the neck.  Twisting of the neck to one side. DIAGNOSIS This condition is diagnosed with a physical exam. You may also have imaging tests, such as an X-ray, CT scan, or MRI. TREATMENT Treatment for this condition involves trying to relax the neck muscles. It may include:  Medicines or shots.  Physical therapy.  Surgery. This may be done in severe cases. HOME CARE INSTRUCTIONS  Take medicines only as directed by your health care provider.  Do stretching  exercises and massage your neck as directed by your health care provider.  Keep all follow-up visits as directed by your health care provider. This is important. SEEK MEDICAL CARE IF:  You develop a fever. SEEK IMMEDIATE MEDICAL CARE IF:  You develop difficulty breathing.  You develop noisy breathing (stridor).  You start drooling.  You have trouble swallowing or have pain with swallowing.  You develop numbness or weakness in your hands or feet.  You have changes in your speech, understanding, or vision.  Your pain gets worse. This information is not intended to replace advice given to you by your health care provider. Make sure you discuss any questions you have with your health care provider. Document Released: 10/17/2000 Document Revised: 02/11/2016 Document Reviewed: 10/16/2014 Elsevier Interactive Patient Education  2017 [employer].

## 2016-12-25 NOTE — Addendum Note (Signed)
Addended by: Davina Poke on: 12/25/2016 01:54 PM   Modules accepted: Orders

## 2016-12-25 NOTE — Telephone Encounter (Signed)
PLEASE NOTE: All timestamps contained within this report are represented as [place] Standard Time. CONFIDENTIALTY NOTICE: This fax transmission is intended only for the addressee. It contains information that is legally privileged, confidential or otherwise protected from use or disclosure. If you are not the intended recipient, you are strictly prohibited from reviewing, disclosing, copying using or disseminating any of this information or taking any action in reliance on or regarding this information. If you have received this fax in error, please notify us immediately by telephone so that we can arrange for its return to [place]. Phone: [phone], Toll-Free: [phone], Fax: [phone] Page: 1 of 1 Call Id: [id number] [place] Patient Name: Patricia Peck DOB: 02/14/1946 Initial Comment Caller states she's having a muscle spasm right side of her neck, down her shoulder. Nurse Assessment Nurse: Erling Cruz, RN, Morey Hummingbird Date/Time (Eastern Time): 12/25/2016 10:42:08 AM Confirm and document reason for call. If symptomatic, describe symptoms. ---Caller states she's having a muscle spasm right side of her neck, down her shoulder. Started 3 days ago. Does the patient have any new or worsening symptoms? ---Yes Will a triage be completed? ---Yes Related visit to physician within the last 2 weeks? ---No Does the PT have any chronic conditions? (i.e. diabetes, asthma, etc.) ---No Is this a behavioral health or substance abuse call? ---No Guidelines Guideline Title Affirmed Question Affirmed Notes Neck Pain or Stiffness Neck pain or stiffness (all triage questions negative) Final Disposition User [facility] Love, RN, Morey Hummingbird Comments Caller states she has already spoken with office & there are no appointments available for today. She states she wants to be seen & she will go to an UC. Disagree/Comply:  Disagree Disagree/Comply Reason: Disagree with instructions

## 2016-12-29 ENCOUNTER — Other Ambulatory Visit: Payer: Self-pay | Admitting: Dermatology

## 2016-12-29 ENCOUNTER — Telehealth: Payer: Self-pay

## 2016-12-29 NOTE — Telephone Encounter (Signed)
Can use 1/2 dose bid to see if less drowsy.  All anti stress meds all same type side effect Pt will try 1/2 tab bid and advise if still a concern

## 2016-12-29 NOTE — Telephone Encounter (Signed)
Any substitution advised?

## 2016-12-29 NOTE — Telephone Encounter (Signed)
PATIENT STATES SHE SAW DR. SAGARDIA ON Thursday FOR TENSION SHE WAS HAVING IN HER NECK. HE PRESCRIBED HER TO HAVE CLONAZEPAM 0.5 MG. SHE WAS TO TAKE IT 2 TIMES A DAY. WHENEVER SHE TAKES THIS MEDICINE IT MAKES HER "LOOPY." ALMOST LIKE SHE CAN'T WALK STRAIGHT. SHE WOULD LIKE TO HAVE SOMETHING ELSE CALLED IN BECAUSE SHE STILL HAS THE STRESS AND TENSION IN HER NECK. BEST PHONE [phone] (HOME) PHARMACY CHOICE IS [facility] ON [address]. [place]

## 2017-01-05 NOTE — Telephone Encounter (Signed)
Verification of benefits have been processed and an approval has been received for pts prolia injection. Pts estimated cost are appx $25. She will also be responsible for a $350 deductible through medicare if it has not already been met.  This is only an estimate and cannot be confirmed until benefits are paid. Please advise pt and schedule if needed. If scheduled, once the injection is received, pls contact me back with the date it was received so that I am able to update prolia folder. thanks

## 2017-01-06 NOTE — Telephone Encounter (Signed)
Yes.  I will place order for calcium now.

## 2017-01-06 NOTE — Addendum Note (Signed)
Addended by: Lucille Passy on: 01/06/2017 01:11 PM   Modules accepted: Orders

## 2017-01-06 NOTE — Telephone Encounter (Signed)
Do you want her to have a calcium lab before she gets the Prolia?

## 2017-01-07 NOTE — Telephone Encounter (Signed)
Spoke to pt and advised $25 will be due at the time of injection. Per Marlynn Perking, the remaining $350 will be billed to her insurance. Medication labeled with pts name for injection

## 2017-01-07 NOTE — Telephone Encounter (Signed)
Patient called about the deductible for Prolia. She said no matter what she wants the shot as soon as possible.  She scheduled lab work for tomorrow. Were both insurances contacted and does she need to pay when she comes in to get the injection?

## 2017-01-07 NOTE — Telephone Encounter (Signed)
Spoke to pt. She is asking if she will have to pay the deductible up front at the time of service or will she be billed for that?

## 2017-01-07 NOTE — Telephone Encounter (Signed)
Injection to be scheduled after 01/27/2017

## 2017-01-08 ENCOUNTER — Other Ambulatory Visit (INDEPENDENT_AMBULATORY_CARE_PROVIDER_SITE_OTHER): Payer: Medicare Other

## 2017-01-08 DIAGNOSIS — M810 Age-related osteoporosis without current pathological fracture: Secondary | ICD-10-CM | POA: Diagnosis not present

## 2017-01-08 LAB — CALCIUM: CALCIUM: 9.9 mg/dL (ref 8.4–10.5)

## 2017-01-08 NOTE — Telephone Encounter (Signed)
Calcium was good. She can move forward with injection

## 2017-01-28 ENCOUNTER — Ambulatory Visit (INDEPENDENT_AMBULATORY_CARE_PROVIDER_SITE_OTHER): Payer: Medicare Other

## 2017-01-28 DIAGNOSIS — M810 Age-related osteoporosis without current pathological fracture: Secondary | ICD-10-CM

## 2017-01-28 MED ORDER — DENOSUMAB 60 MG/ML ~~LOC~~ SOLN
60.0000 mg | Freq: Once | SUBCUTANEOUS | Status: AC
  Administered 2017-01-28: 60 mg via SUBCUTANEOUS

## 2017-01-28 MED ORDER — DENOSUMAB 60 MG/ML ~~LOC~~ SOLN: 60.0000 mg | Freq: Once | SUBCUTANEOUS | 0 refills | Status: DC

## 2017-01-28 NOTE — Patient Instructions (Signed)
Patient received a prolia injection, pt to come back in 6 months.

## 2017-02-10 ENCOUNTER — Encounter: Payer: Self-pay | Admitting: Family Medicine

## 2017-02-10 ENCOUNTER — Ambulatory Visit (INDEPENDENT_AMBULATORY_CARE_PROVIDER_SITE_OTHER): Payer: Medicare Other | Admitting: Family Medicine

## 2017-02-10 VITALS — BP 118/76 | HR 56 | Wt 133.5 lb

## 2017-02-10 DIAGNOSIS — G4709 Other insomnia: Secondary | ICD-10-CM | POA: Diagnosis not present

## 2017-02-10 MED ORDER — CLONAZEPAM 0.5 MG PO TABS: 0.5000 mg | ORAL_TABLET | Freq: Every evening | ORAL | 1 refills | Status: DC | PRN

## 2017-02-10 NOTE — Patient Instructions (Signed)
Great to see you.  We are adding Klonipin as needed.  Please keep me updated.

## 2017-02-10 NOTE — Progress Notes (Signed)
Subjective:   Patient ID: Patricia Peck, female    DOB: 02-28-1946, 71 y.o.   MRN: [id number]  Patricia Peck is a pleasant 71 y.o. year old female who presents to clinic today with Acute Visit (troble sleeping)  on 02/10/2017  HPI:  Insomnia- persistent and troubling issue for her. Currently taking Melatonin and trazodone 150 mg qhs. This had been working pretty well until recently.  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  She is falling asleep but not staying asleep.  Benadryl and other antihistamines have not been helpful.  Was given Klonipin at urgent care and that was helpful.  She is very active.  Plays tennis multiple times per week.  No falls. Current Outpatient Prescriptions on File Prior to Visit  Medication Sig Dispense Refill  . Alpha-D-Galactosidase (BEANO PO) Take by mouth 2 (two) times daily.    Marland Kitchen atorvastatin (LIPITOR) 20 MG tablet TAKE 1 TABLET BY MOUTH DAILY 90 tablet 2  . Bepotastine Besilate (BEPREVE) 1.5 % SOLN Use one drop in each eye twice a day    . bumetanide (BUMEX) 1 MG tablet TAKE 1 TABLET BY MOUTH 2 TIMES A DAY. 180 tablet 2  . buPROPion (WELLBUTRIN) 75 MG tablet TAKE 1 TABLET BY MOUTH TWICE A DAY. (Patient taking differently: TAKE 1 TABLET BY MOUTH Daily) 180 tablet 2  . Calcium Carbonate-Vitamin D (CALCIUM 600/VITAMIN D) 600-400 MG-UNIT per tablet Take 1 tablet by mouth 2 (two) times daily.      . Cholecalciferol (VITAMIN D-3) 5000 UNITS TABS Take one tablet by mouth three times a week    . clonazePAM (KLONOPIN) 0.5 MG tablet Take 1 tablet (0.5 mg total) by mouth 2 (two) times daily as needed for anxiety. 20 tablet 1  . cyclobenzaprine (FLEXERIL) 10 MG tablet Take 0.5 to 1 tablet at bedtime as needed for muscle spasm 20 tablet 0  . denosumab (PROLIA) 60 MG/ML SOLN injection Inject 60 mg into the skin once. Administer in upper arm, thigh, or abdomen 180 mL 0  . diclofenac sodium (VOLTAREN) 1 %  GEL Apply as needed 9 Tube 3  . famotidine (PEPCID AC) 10 MG chewable tablet Chew 10 mg by mouth daily.    . fexofenadine (ALLEGRA) 60 MG tablet Take 1 tablet (60 mg total) by mouth 2 (two) times daily. 60 tablet 3  . Fluticasone-Salmeterol (ADVAIR DISKUS) 100-50 MCG/DOSE AEPB Inhale 1 puff into the lungs 2 (two) times daily. 180 each 1  . levothyroxine (SYNTHROID, LEVOTHROID) 50 MCG tablet TAKE 1 TABLET BY MOUTH DAILY. 90 tablet 1  . MAGNESIUM PO Take 1 tablet by mouth daily.    . Melatonin 5 MG TABS Take 3 tablets my mouth at bedtime    . metoprolol succinate (TOPROL-XL) 25 MG 24 hr tablet TAKE 1 TABLET BY MOUTH DAILY. NEED APPOINTMENT FOR REFILLS. (Patient taking differently: TAKE 1 TABLET BY MOUTH DAILY.) 90 tablet 1  . NEXIUM 40 MG capsule Take 1 capsule (40 mg total) by mouth daily. 90 capsule 3  . nystatin (MYCOSTATIN) 100000 UNIT/ML suspension SWISH BY MOUTH 10 ML UP TO 4 TIMES A DAY 240 mL 1  . potassium chloride (K-DUR) 10 MEQ tablet TAKE 1 TABLET BY MOUTH 2 TIMES A DAY. 180 tablet 2  . traZODone (DESYREL) 50 MG tablet TAKE 3 TABLETS BY MOUTH AT BEDTIME. 270 tablet 1  . XARELTO 20 MG TABS tablet TAKE 1 TABLET BY MOUTH DAILY. [place]  tablet 2   No current facility-administered medications on file prior to visit.     No Known Allergies  Past Medical History:  Diagnosis Date  . Allergy   . Arthritis    hands  . Asthma   . Atrial fibrillation ([place])   . Atrial flutter ([place])   . Depression   . Dysrhythmia    a-fib, on Xarelto  . GERD (gastroesophageal reflux disease)   . Hyperlipidemia   . Hypertension   . Hypothyroidism   . Osteoporosis   . TIA (transient ischemic attack)     Past Surgical History:  Procedure Laterality Date  . ABDOMINAL HYSTERECTOMY    . COLONOSCOPY    . MYRINGOTOMY WITH TUBE PLACEMENT Right 01/14/2016   Procedure: RIGHT MYRINGOTOMY WITH T-TUBE PLACEMENT;  Surgeon: Leta Baptist, MD;  Location: [facility];  Service: ENT;  Laterality: Right;  .  PARTIAL HYSTERECTOMY  1989  . TEE WITHOUT CARDIOVERSION N/A 12/22/2012   Procedure: TRANSESOPHAGEAL ECHOCARDIOGRAM (TEE);  Surgeon: Lelon Perla, MD;  Location[facility]COPY;  Service: Cardiovascular;  Laterality: N/A;    Family History  Problem Relation Age of Onset  . Breast cancer Mother   . Drug abuse Son   . Depression Son   . Stroke Paternal Aunt   . Heart disease Paternal Grandfather   . Diabetes Maternal Grandfather     Social History   Social History  . Marital status: Married    Spouse name: N/A  . Number of children: 2  . Years of education: N/A   Occupational History  .  Retired   Social History Main Topics  . Smoking status: Never Smoker  . Smokeless tobacco: Never Used  . Alcohol use Yes     Comment: Rare  . Drug use: No  . Sexual activity: Not on file   Other Topics Concern  . Not on file   Social History Narrative   Does have HPOA- sons Eddie Dibbles and Wille Glaser.   Does desire life support if not futile.   Not sure about feeding tubes.   The PMH, PSH, Social History, Family History, Medications, and allergies have been reviewed in [facility], and have been updated if relevant.   Review of Systems  Psychiatric/Behavioral: Positive for sleep disturbance. Negative for behavioral problems, confusion, decreased concentration, dysphoric mood, hallucinations and self-injury. The patient is nervous/anxious. The patient is not hyperactive.   All other systems reviewed and are negative.      Objective:    BP 118/76   Pulse (!) 56   Wt 133 lb 8 oz (60.6 kg)   SpO2 98%   BMI 23.65 kg/m    Physical Exam  Constitutional: She is oriented to person, place, and time. She appears well-developed and well-nourished. No distress.  HENT:  Head: Normocephalic and atraumatic.  Eyes: Conjunctivae are normal.  Pulmonary/Chest: Effort normal.  Musculoskeletal: Normal range of motion.  Neurological: She is alert and oriented to person, place, and time. No cranial nerve deficit.   Skin: Skin is warm and dry. She is not diaphoretic.  Psychiatric: She has a normal mood and affect. Her behavior is normal. Judgment and thought content normal.  Nursing note and vitals reviewed.         Assessment & Plan:   Other insomnia No Follow-up on file.

## 2017-02-10 NOTE — Assessment & Plan Note (Signed)
>  25 minutes spent in face to face time with patient, >50% spent in counselling or coordination of care Deteriorated.  The problem of recurrent insomnia is discussed. Avoidance of caffeine sources is strongly encouraged. Sleep hygiene issues are reviewed. The use of sedative hypnotics for temporary relief is appropriate; we discussed the addictive nature of these drugs, and a one-time only prescription for prn use of a hypnotic is given, to use no more than 3 times per week for 2-3 weeks.

## 2017-03-16 ENCOUNTER — Encounter: Payer: Self-pay | Admitting: Family Medicine

## 2017-03-31 ENCOUNTER — Other Ambulatory Visit: Payer: Self-pay | Admitting: Family Medicine

## 2017-03-31 NOTE — Telephone Encounter (Signed)
wellbutrin last refilled on 04/21/16 #180 +2, last OV 04/17/16.

## 2017-04-18 ENCOUNTER — Other Ambulatory Visit: Payer: Self-pay | Admitting: Family Medicine

## 2017-04-18 DIAGNOSIS — E038 Other specified hypothyroidism: Secondary | ICD-10-CM

## 2017-04-18 DIAGNOSIS — Z01419 Encounter for gynecological examination (general) (routine) without abnormal findings: Secondary | ICD-10-CM | POA: Insufficient documentation

## 2017-04-21 ENCOUNTER — Ambulatory Visit (INDEPENDENT_AMBULATORY_CARE_PROVIDER_SITE_OTHER): Payer: Medicare Other

## 2017-04-21 VITALS — BP 102/70 | HR 60 | Temp 97.8°F | Ht 63.0 in | Wt 128.5 lb

## 2017-04-21 DIAGNOSIS — E038 Other specified hypothyroidism: Secondary | ICD-10-CM | POA: Diagnosis not present

## 2017-04-21 DIAGNOSIS — Z0000 Encounter for general adult medical examination without abnormal findings: Secondary | ICD-10-CM | POA: Diagnosis not present

## 2017-04-21 DIAGNOSIS — Z01419 Encounter for gynecological examination (general) (routine) without abnormal findings: Secondary | ICD-10-CM

## 2017-04-21 LAB — CBC WITH DIFFERENTIAL/PLATELET
BASOS ABS: 0.1 10*3/uL (ref 0.0–0.1)
BASOS PCT: 0.9 % (ref 0.0–3.0)
Eosinophils Absolute: 0.2 10*3/uL (ref 0.0–0.7)
Eosinophils Relative: 2.6 % (ref 0.0–5.0)
HCT: 38.7 % (ref 36.0–46.0)
Hemoglobin: 13.2 g/dL (ref 12.0–15.0)
LYMPHS ABS: 2.5 10*3/uL (ref 0.7–4.0)
Lymphocytes Relative: 31 % (ref 12.0–46.0)
MCHC: 34.1 g/dL (ref 30.0–36.0)
MCV: 86.8 fl (ref 78.0–100.0)
Monocytes Absolute: 0.8 10*3/uL (ref 0.1–1.0)
Monocytes Relative: 9.5 % (ref 3.0–12.0)
NEUTROS ABS: 4.6 10*3/uL (ref 1.4–7.7)
NEUTROS PCT: 56 % (ref 43.0–77.0)
PLATELETS: 330 10*3/uL (ref 150.0–400.0)
RBC: 4.46 Mil/uL (ref 3.87–5.11)
RDW: 13.5 % (ref 11.5–15.5)
WBC: 8.2 10*3/uL (ref 4.0–10.5)

## 2017-04-21 LAB — COMPREHENSIVE METABOLIC PANEL
ALT: 17 U/L (ref 0–35)
AST: 18 U/L (ref 0–37)
Albumin: 4.4 g/dL (ref 3.5–5.2)
Alkaline Phosphatase: 64 U/L (ref 39–117)
BUN: 20 mg/dL (ref 6–23)
CALCIUM: 10.2 mg/dL (ref 8.4–10.5)
CHLORIDE: 101 meq/L (ref 96–112)
CO2: 30 meq/L (ref 19–32)
Creatinine, Ser: 0.94 mg/dL (ref 0.40–1.20)
GFR: 62.24 mL/min (ref >60.00)
GLUCOSE: 90 mg/dL (ref 70–99)
POTASSIUM: 3.5 meq/L (ref 3.5–5.1)
Sodium: 140 mEq/L (ref 135–145)
Total Bilirubin: 0.6 mg/dL (ref 0.2–1.2)
Total Protein: 7.1 g/dL (ref 6.0–8.3)

## 2017-04-21 LAB — LIPID PANEL
CHOL/HDL RATIO: 4
Cholesterol: 159 mg/dL (ref 0–200)
HDL: 38.8 mg/dL — AB (ref >39.00)
LDL CALC: 98 mg/dL (ref 0–99)
NONHDL: 120.58
TRIGLYCERIDES: 113 mg/dL (ref 0.0–149.0)
VLDL: 22.6 mg/dL (ref 0.0–40.0)

## 2017-04-21 LAB — TSH: TSH: 0.93 u[IU]/mL (ref 0.35–4.50)

## 2017-04-21 NOTE — Progress Notes (Signed)
I reviewed health advisor's note, was available for consultation, and agree with documentation and plan.  

## 2017-04-21 NOTE — Patient Instructions (Addendum)
[name] , Thank you for taking time to come for your Medicare Wellness Visit. I appreciate your ongoing commitment to your health goals. Please review the following plan we discussed and let me know if I can assist you in the future.   These are the goals we discussed: Goals    . Increase physical activity          Starting 04/21/2017, I will continue to golf for at least 2.5 hours per week.        This is a list of the screening recommended for you and due dates:  Health Maintenance  Topic Date Due  . Flu Shot  06/03/2017  . Mammogram  03/13/2018  . Colon Cancer Screening  07/20/2024  . Tetanus Vaccine  04/16/2025  . DEXA scan (bone density measurement)  Completed  .  Hepatitis C: One time screening is recommended by Center for Disease Control  (CDC) for  adults born from [birth year] through 1966.   Completed  . Pneumonia vaccines  Completed   Preventive Care for Adults  A healthy lifestyle and preventive care can promote health and wellness. Preventive health guidelines for adults include the following key practices.  . A routine yearly physical is a good way to check with your health care provider about your health and preventive screening. It is a chance to share any concerns and updates on your health and to receive a thorough exam.  . Visit your dentist for a routine exam and preventive care every 6 months. Brush your teeth twice a day and floss once a day. Good oral hygiene prevents tooth decay and gum disease.  . The frequency of eye exams is based on your age, health, family medical history, use  of contact lenses, and other factors. Follow your health care provider's ecommendations for frequency of eye exams.  . Eat a healthy diet. Foods like vegetables, fruits, whole grains, low-fat dairy products, and lean protein foods contain the nutrients you need without too many calories. Decrease your intake of foods high in solid fats, added sugars, and salt. Eat the right amount of  calories for you. Get information about a proper diet from your health care provider, if necessary.  . Regular physical exercise is one of the most important things you can do for your health. Most adults should get at least 150 minutes of moderate-intensity exercise (any activity that increases your heart rate and causes you to sweat) each week. In addition, most adults need muscle-strengthening exercises on 2 or more days a week.  Silver Sneakers may be a benefit available to you. To determine eligibility, you may visit the website: www.silversneakers.com or contact program at 2250344186 Mon-Fri between 8AM-8PM.   . Maintain a healthy weight. The body mass index (BMI) is a screening tool to identify possible weight problems. It provides an estimate of body fat based on height and weight. Your health care provider can find your BMI and can help you achieve or maintain a healthy weight.   For adults 20 years and older: ? A BMI below 18.5 is considered underweight. ? A BMI of 18.5 to 24.9 is normal. ? A BMI of 25 to 29.9 is considered overweight. ? A BMI of 30 and above is considered obese.   . Maintain normal blood lipids and cholesterol levels by exercising and minimizing your intake of saturated fat. Eat a balanced diet with plenty of fruit and vegetables. Blood tests for lipids and cholesterol should begin at age 97 and  be repeated every 5 years. If your lipid or cholesterol levels are high, you are over 50, or you are at high risk for heart disease, you may need your cholesterol levels checked more frequently. Ongoing high lipid and cholesterol levels should be treated with medicines if diet and exercise are not working.  . If you smoke, find out from your health care provider how to quit. If you do not use tobacco, please do not start.  . If you choose to drink alcohol, please do not consume more than 2 drinks per day. One drink is considered to be 12 ounces (355 mL) of beer, 5 ounces  (148 mL) of wine, or 1.5 ounces (44 mL) of liquor.  . If you are 66-54 years old, ask your health care provider if you should take aspirin to prevent strokes.  . Use sunscreen. Apply sunscreen liberally and repeatedly throughout the day. You should seek shade when your shadow is shorter than you. Protect yourself by wearing long sleeves, pants, a wide-brimmed hat, and sunglasses year round, whenever you are outdoors.  . Once a month, do a whole body skin exam, using a mirror to look at the skin on your back. Tell your health care provider of new moles, moles that have irregular borders, moles that are larger than a pencil eraser, or moles that have changed in shape or color.

## 2017-04-21 NOTE — Progress Notes (Signed)
PCP notes:   Health maintenance:  No gaps identified.  Abnormal screenings:   Mini-Cog score: 18/20  Patient concerns:   None  Nurse concerns:  None  Next PCP appt:   04/23/17 @ 1215

## 2017-04-21 NOTE — Progress Notes (Signed)
Pre visit review using our clinic review tool, if applicable. No additional management support is needed unless otherwise documented below in the visit note. 

## 2017-04-21 NOTE — Progress Notes (Signed)
Subjective:   Patricia Peck is a 71 y.o. female who presents for Medicare Annual (Subsequent) preventive examination.  Review of Systems:  N/A Cardiac Risk Factors include: advanced age (>42men, >32 women);hypertension     Objective:     Vitals: BP 102/70 (BP Location: Right Arm, Patient Position: Sitting, Cuff Size: Normal)   Pulse 60   Temp 97.8 F (36.6 C) (Oral)   Ht 5\' 3"  (1.6 m) Comment: no shoes  Wt 128 lb 8 oz (58.3 kg)   SpO2 98%   BMI 22.76 kg/m   Body mass index is 22.76 kg/m.   Tobacco History  Smoking Status  . Never Smoker  Smokeless Tobacco  . Never Used     Counseling given: No   Past Medical History:  Diagnosis Date  . Allergy   . Arthritis    hands  . Asthma   . Atrial fibrillation ([place])   . Atrial flutter ([place])   . Depression   . Dysrhythmia    a-fib, on Xarelto  . GERD (gastroesophageal reflux disease)   . Hyperlipidemia   . Hypertension   . Hypothyroidism   . Osteoporosis   . TIA (transient ischemic attack)    Past Surgical History:  Procedure Laterality Date  . ABDOMINAL HYSTERECTOMY    . COLONOSCOPY    . MYRINGOTOMY WITH TUBE PLACEMENT Right 01/14/2016   Procedure: RIGHT MYRINGOTOMY WITH T-TUBE PLACEMENT;  Surgeon: Leta Baptist, MD;  Location: [place];  Service: ENT;  Laterality: Right;  . PARTIAL HYSTERECTOMY  1989  . TEE WITHOUT CARDIOVERSION N/A 12/22/2012   Procedure: TRANSESOPHAGEAL ECHOCARDIOGRAM (TEE);  Surgeon: Lelon Perla, MD;  Location: [facility] ENDOSCOPY;  Service: Cardiovascular;  Laterality: N/A;   Family History  Problem Relation Age of Onset  . Breast cancer Mother   . Drug abuse Son   . Depression Son   . Stroke Paternal Aunt   . Heart disease Paternal Grandfather   . Diabetes Maternal Grandfather    History  Sexual Activity  . Sexual activity: Not on file    Outpatient Encounter Prescriptions as of 04/21/2017  Medication Sig  . Alpha-D-Galactosidase (BEANO PO) Take by mouth 2 (two)  times daily.  Marland Kitchen atorvastatin (LIPITOR) 20 MG tablet TAKE 1 TABLET BY MOUTH DAILY  . Bepotastine Besilate (BEPREVE) 1.5 % SOLN Use one drop in each eye twice a day  . bumetanide (BUMEX) 1 MG tablet TAKE 1 TABLET BY MOUTH 2 TIMES A DAY.  Marland Kitchen buPROPion (WELLBUTRIN) 75 MG tablet TAKE 1 TABLET BY MOUTH TWICE A DAY.  . Calcium Carbonate-Vitamin D (CALCIUM 600/VITAMIN D) 600-400 MG-UNIT per tablet Take 1 tablet by mouth 2 (two) times daily.    . Cholecalciferol (VITAMIN D-3) 5000 UNITS TABS Take one tablet by mouth three times a week  . clonazePAM (KLONOPIN) 0.5 MG tablet Take 1 tablet (0.5 mg total) by mouth at bedtime as needed for anxiety.  . cyclobenzaprine (FLEXERIL) 10 MG tablet Take 0.5 to 1 tablet at bedtime as needed for muscle spasm  . denosumab (PROLIA) 60 MG/ML SOLN injection Inject 60 mg into the skin once. Administer in upper arm, thigh, or abdomen  . diclofenac sodium (VOLTAREN) 1 % GEL Apply as needed  . famotidine (PEPCID AC) 10 MG chewable tablet Chew 10 mg by mouth daily.  . fexofenadine (ALLEGRA) 60 MG tablet Take 1 tablet (60 mg total) by mouth 2 (two) times daily.  . Fluticasone-Salmeterol (ADVAIR DISKUS) 100-50 MCG/DOSE AEPB Inhale 1 puff into the  lungs 2 (two) times daily.  Marland Kitchen levothyroxine (SYNTHROID, LEVOTHROID) 50 MCG tablet TAKE 1 TABLET BY MOUTH DAILY.  Marland Kitchen MAGNESIUM PO Take 1 tablet by mouth daily.  . Melatonin 5 MG TABS Take 3 tablets my mouth at bedtime  . metoprolol succinate (TOPROL-XL) 25 MG 24 hr tablet TAKE 1 TABLET BY MOUTH DAILY. NEED APPOINTMENT FOR REFILLS. (Patient taking differently: TAKE 1 TABLET BY MOUTH DAILY.)  . NEXIUM 40 MG capsule TAKE 1 CAPSULE BY MOUTH DAILY.  Marland Kitchen nystatin (MYCOSTATIN) 100000 UNIT/ML suspension SWISH BY MOUTH 10 ML UP TO 4 TIMES A DAY  . potassium chloride (K-DUR) 10 MEQ tablet TAKE 1 TABLET BY MOUTH 2 TIMES A DAY.  . traZODone (DESYREL) 50 MG tablet TAKE 3 TABLETS BY MOUTH AT BEDTIME.  Marland Kitchen XARELTO 20 MG TABS tablet TAKE 1 TABLET BY MOUTH  DAILY.   No facility-administered encounter medications on file as of 04/21/2017.     Activities of Daily Living In your present state of health, do you have any difficulty performing the following activities: 04/21/2017  Hearing? Y  Vision? N  Difficulty concentrating or making decisions? N  Walking or climbing stairs? N  Dressing or bathing? N  Doing errands, shopping? N  Preparing Food and eating ? N  Using the Toilet? N  In the past six months, have you accidently leaked urine? N  Do you have problems with loss of bowel control? N  Managing your Medications? N  Managing your Finances? N  Housekeeping or managing your Housekeeping? N  Some recent data might be hidden    Patient Care Team: Lucille Passy, MD as PCP - General Stanford Breed Denice Bors, MD as Consulting Physician (Cardiology) Leta Baptist, MD as Consulting Physician (Otolaryngology) Mosetta Anis, MD as Referring Physician (Allergy)    Assessment:    Hearing Screening Comments: Hearing exam with ENT in May 2018; per pt, hearing loss in right ear Vision Screening Comments: Last vision exam in May 2018 with Dr. Anders Simmonds  Exercise Activities and Dietary recommendations Current Exercise Habits: Home exercise routine, Type of exercise: Other - see comments (golf 2.5 hours per week), Time (Minutes): > 60, Frequency (Times/Week): 1, Weekly Exercise (Minutes/Week): 0, Intensity: Mild, Exercise limited by: None identified  Goals    . Increase physical activity          Starting 04/21/2017, I will continue to golf for at least 2.5 hours per week.       Fall Risk Fall Risk  04/21/2017 12/25/2016 04/17/2016 04/17/2015 04/12/2014  Falls in the past year? No No No No No   Depression Screen PHQ 2/9 Scores 04/21/2017 12/25/2016 04/17/2016 04/17/2015  PHQ - 2 Score 0 0 0 0     Cognitive Function MMSE - Mini Mental State Exam 04/21/2017  Orientation to time 5  Orientation to Place 5  Registration 3  Attention/ Calculation 0  Recall 1    Recall-comments pt was unable to recall 2 of 3 words  Language- name 2 objects 0  Language- repeat 1  Language- follow 3 step command 3  Language- read & follow direction 0  Write a sentence 0  Copy design 0  Total score 18     PLEASE NOTE: A Mini-Cog screen was completed. Maximum score is 20. A value of 0 denotes this part of Folstein MMSE was not completed or the patient failed this part of the Mini-Cog screening.   Mini-Cog Screening Orientation to Time - Max 5 pts Orientation to Place - Max 5  pts Registration - Max 3 pts Recall - Max 3 pts Language Repeat - Max 1 pts Language Follow 3 Step Command - Max 3 pts     Immunization History  Administered Date(s) Administered  . Influenza Split 09/04/2011, 08/11/2012  . Influenza Whole 08/07/2009  . Influenza, High Dose Seasonal PF 08/28/2014  . Influenza,inj,Quad PF,36+ Mos 08/24/2013, 07/20/2015, 07/29/2016  . Pneumococcal Conjugate-13 04/12/2014  . Pneumococcal Polysaccharide-23 02/05/2006, 04/08/2012  . Td 01/29/2005, 04/17/2015  . Zoster 04/16/2007   Screening Tests Health Maintenance  Topic Date Due  . INFLUENZA VACCINE  06/03/2017  . MAMMOGRAM  03/13/2018  . COLONOSCOPY  07/20/2024  . TETANUS/TDAP  04/16/2025  . DEXA SCAN  Completed  . Hepatitis C Screening  Completed  . PNA vac Low Risk Adult  Completed      Plan:     I have personally reviewed and addressed the Medicare Annual Wellness questionnaire and have noted the following in the patient's chart:  A. Medical and social history B. Use of alcohol, tobacco or illicit drugs  C. Current medications and supplements D. Functional ability and status E.  Nutritional status F.  Physical activity G. Advance directives H. List of other physicians I.  Hospitalizations, surgeries, and ER visits in previous 12 months J.  [place] to include hearing, vision, cognitive, depression L. Referrals and appointments - none  In addition, I have reviewed and  discussed with patient certain preventive protocols, quality metrics, and best practice recommendations. A written personalized care plan for preventive services as well as general preventive health recommendations were provided to patient.  See attached scanned questionnaire for additional information.   Signed,   Lindell Noe, MHA, BS, LPN Health Coach

## 2017-04-22 ENCOUNTER — Encounter: Payer: Self-pay | Admitting: Family Medicine

## 2017-04-23 ENCOUNTER — Telehealth: Payer: Self-pay

## 2017-04-23 ENCOUNTER — Ambulatory Visit (INDEPENDENT_AMBULATORY_CARE_PROVIDER_SITE_OTHER): Payer: Medicare Other | Admitting: Family Medicine

## 2017-04-23 ENCOUNTER — Encounter: Payer: Self-pay | Admitting: Family Medicine

## 2017-04-23 VITALS — BP 102/70 | HR 63 | Ht 63.0 in | Wt 128.5 lb

## 2017-04-23 DIAGNOSIS — I679 Cerebrovascular disease, unspecified: Secondary | ICD-10-CM

## 2017-04-23 DIAGNOSIS — F32A Depression, unspecified: Secondary | ICD-10-CM

## 2017-04-23 DIAGNOSIS — K219 Gastro-esophageal reflux disease without esophagitis: Secondary | ICD-10-CM

## 2017-04-23 DIAGNOSIS — G4709 Other insomnia: Secondary | ICD-10-CM | POA: Diagnosis not present

## 2017-04-23 DIAGNOSIS — F329 Major depressive disorder, single episode, unspecified: Secondary | ICD-10-CM | POA: Diagnosis not present

## 2017-04-23 DIAGNOSIS — I4891 Unspecified atrial fibrillation: Secondary | ICD-10-CM | POA: Diagnosis not present

## 2017-04-23 DIAGNOSIS — Z0000 Encounter for general adult medical examination without abnormal findings: Secondary | ICD-10-CM

## 2017-04-23 DIAGNOSIS — M810 Age-related osteoporosis without current pathological fracture: Secondary | ICD-10-CM

## 2017-04-23 DIAGNOSIS — I10 Essential (primary) hypertension: Secondary | ICD-10-CM | POA: Diagnosis not present

## 2017-04-23 DIAGNOSIS — E038 Other specified hypothyroidism: Secondary | ICD-10-CM

## 2017-04-23 DIAGNOSIS — Z01419 Encounter for gynecological examination (general) (routine) without abnormal findings: Secondary | ICD-10-CM

## 2017-04-23 MED ORDER — BUPROPION HCL 75 MG PO TABS: 75.0000 mg | ORAL_TABLET | Freq: Two times a day (BID) | ORAL | 3 refills | Status: DC

## 2017-04-23 MED ORDER — NEXIUM 40 MG PO CPDR: 40.0000 mg | DELAYED_RELEASE_CAPSULE | Freq: Every day | ORAL | 6 refills | Status: DC

## 2017-04-23 MED ORDER — BUMETANIDE 1 MG PO TABS: 1.0000 mg | ORAL_TABLET | Freq: Two times a day (BID) | ORAL | 6 refills | Status: DC

## 2017-04-23 MED ORDER — CLONAZEPAM 0.5 MG PO TABS: 0.5000 mg | ORAL_TABLET | Freq: Every evening | ORAL | 1 refills | Status: DC | PRN

## 2017-04-23 MED ORDER — LEVOTHYROXINE SODIUM 50 MCG PO TABS: 50.0000 ug | ORAL_TABLET | Freq: Every day | ORAL | 6 refills | Status: DC

## 2017-04-23 NOTE — Assessment & Plan Note (Signed)
Reviewed preventive care protocols, scheduled due services, and updated immunizations Discussed nutrition, exercise, diet, and healthy lifestyle.  

## 2017-04-23 NOTE — Assessment & Plan Note (Signed)
Euthyroid.  No changes made to rxs. 

## 2017-04-23 NOTE — Assessment & Plan Note (Signed)
Rate controlled. Followed by Dr. Stanford Breed as well. Continue beta blocker and xarelto.

## 2017-04-23 NOTE — Progress Notes (Signed)
Subjective:   Patient ID: Patricia Peck, female    DOB: May 01, 1946, 71 y.o.   MRN: [id number]  Patricia Peck is a pleasant 71 y.o. year old female who presents to clinic today with her husband for her Annual Exam and follow up of chronic medical conditions on 04/23/2017  HPI:  Annual medicare wellness visit with Candis Musa, RN on 04/21/17. Notes reviewed.  Remote h/o hysterectomy Mammogram 03/13/17 Colonoscopy 07/20/14 Zostavax 04/16/2007 DEXA 05/05/16 Td 01/30/08 Pneumovax 04/08/2012 Prevnar 13 04/12/2014   Still playing tennis multiple times per week.  Insomnia- persistent issue for her. Currently taking Melatonin and trazodone 150 mg qhs.  GERD-remains on Dexilant.  Afib- followed by Dr. Stanford Breed  Last saw him on 07/23/16- note reviewed. No changes made.  Continued on Toprol and Xarelto, along with statin.  Carotid dopplers 12/24/16  Osteoporosis-  Receiving prolia.  Last dose 01/28/17 DEXA 05/05/16- improvement/osteopenia.  Hypothyroidism- on synthroid 50 mcg daily. Denies any symptoms of hypo or hyperthyroidism.   Lab Results  Component Value Date   TSH 0.93 04/21/2017   HLD- has been well controlled on lipitor. Lab Results  Component Value Date   CHOL 159 04/21/2017   HDL 38.80 (L) 04/21/2017   LDLCALC 98 04/21/2017   LDLDIRECT 129.2 05/28/2007   TRIG 113.0 04/21/2017   CHOLHDL 4 04/21/2017   Lab Results  Component Value Date   NA 140 04/21/2017   K 3.5 04/21/2017   CL 101 04/21/2017   CO2 30 04/21/2017   Lab Results  Component Value Date   ALT 17 04/21/2017   AST 18 04/21/2017   ALKPHOS 64 04/21/2017   BILITOT 0.6 04/21/2017   Lab Results  Component Value Date   WBC 8.2 04/21/2017   HGB 13.2 04/21/2017   HCT 38.7 04/21/2017   MCV 86.8 04/21/2017   PLT 330.0 04/21/2017     Current Outpatient Prescriptions on File Prior to Visit  Medication Sig Dispense Refill  . Alpha-D-Galactosidase (BEANO PO) Take by mouth 2 (two) times daily.      Marland Kitchen atorvastatin (LIPITOR) 20 MG tablet TAKE 1 TABLET BY MOUTH DAILY 90 tablet 2  . Bepotastine Besilate (BEPREVE) 1.5 % SOLN Use one drop in each eye twice a day    . Calcium Carbonate-Vitamin D (CALCIUM 600/VITAMIN D) 600-400 MG-UNIT per tablet Take 1 tablet by mouth 2 (two) times daily.      . Cholecalciferol (VITAMIN D-3) 5000 UNITS TABS Take one tablet by mouth three times a week    . cyclobenzaprine (FLEXERIL) 10 MG tablet Take 0.5 to 1 tablet at bedtime as needed for muscle spasm 20 tablet 0  . denosumab (PROLIA) 60 MG/ML SOLN injection Inject 60 mg into the skin once. Administer in upper arm, thigh, or abdomen 180 mL 0  . diclofenac sodium (VOLTAREN) 1 % GEL Apply as needed 9 Tube 3  . famotidine (PEPCID AC) 10 MG chewable tablet Chew 10 mg by mouth daily.    . fexofenadine (ALLEGRA) 60 MG tablet Take 1 tablet (60 mg total) by mouth 2 (two) times daily. (Patient taking differently: Take 60 mg by mouth 2 (two) times daily. PATIENT TAKES 180MG  AM) 60 tablet 3  . Fluticasone-Salmeterol (ADVAIR DISKUS) 100-50 MCG/DOSE AEPB Inhale 1 puff into the lungs 2 (two) times daily. 180 each 1  . MAGNESIUM PO Take 1 tablet by mouth daily.    . Melatonin 5 MG TABS Take 3 tablets my mouth at bedtime    . metoprolol succinate (TOPROL-XL)  25 MG 24 hr tablet TAKE 1 TABLET BY MOUTH DAILY. NEED APPOINTMENT FOR REFILLS. (Patient taking differently: TAKE 1 TABLET BY MOUTH DAILY.) 90 tablet 1  . nystatin (MYCOSTATIN) 100000 UNIT/ML suspension SWISH BY MOUTH 10 ML UP TO 4 TIMES A DAY 240 mL 1  . potassium chloride (K-DUR) 10 MEQ tablet TAKE 1 TABLET BY MOUTH 2 TIMES A DAY. 180 tablet 2  . traZODone (DESYREL) 50 MG tablet TAKE 3 TABLETS BY MOUTH AT BEDTIME. 270 tablet 2  . XARELTO 20 MG TABS tablet TAKE 1 TABLET BY MOUTH DAILY. 90 tablet 2   No current facility-administered medications on file prior to visit.     No Known Allergies  Past Medical History:  Diagnosis Date  . Allergy   . Arthritis    hands  .  Asthma   . Atrial fibrillation ([place])   . Atrial flutter ([place])   . Depression   . Dysrhythmia    a-fib, on Xarelto  . GERD (gastroesophageal reflux disease)   . Hyperlipidemia   . Hypertension   . Hypothyroidism   . Osteoporosis   . TIA (transient ischemic attack)     Past Surgical History:  Procedure Laterality Date  . ABDOMINAL HYSTERECTOMY    . COLONOSCOPY    . MYRINGOTOMY WITH TUBE PLACEMENT Right 01/14/2016   Procedure: RIGHT MYRINGOTOMY WITH T-TUBE PLACEMENT;  Surgeon: Leta Baptist, MD;  Location: [place];  Service: ENT;  Laterality: Right;  . PARTIAL HYSTERECTOMY  1989  . TEE WITHOUT CARDIOVERSION N/A 12/22/2012   Procedure: TRANSESOPHAGEAL ECHOCARDIOGRAM (TEE);  Surgeon: Lelon Perla, MD;  Location: [facility] ENDOSCOPY;  Service: Cardiovascular;  Laterality: N/A;    Family History  Problem Relation Age of Onset  . Breast cancer Mother   . Drug abuse Son   . Depression Son   . Stroke Paternal Aunt   . Heart disease Paternal Grandfather   . Diabetes Maternal Grandfather     Social History   Social History  . Marital status: Married    Spouse name: N/A  . Number of children: 2  . Years of education: N/A   Occupational History  .  Retired   Social History Main Topics  . Smoking status: Never Smoker  . Smokeless tobacco: Never Used  . Alcohol use Yes     Comment: Rare  . Drug use: No  . Sexual activity: Not on file   Other Topics Concern  . Not on file   Social History Narrative   Does have HPOA- sons Eddie Dibbles and Wille Glaser.   Does desire life support if not futile.   Not sure about feeding tubes.   The PMH, PSH, Social History, Family History, Medications, and allergies have been reviewed in [facility], and have been updated if relevant.  Review of Systems  Constitutional: Negative.   HENT: Negative.   Eyes: Negative.   Respiratory: Negative.   Cardiovascular: Negative.   Gastrointestinal: Negative.   Endocrine: Negative.   Genitourinary: Negative.     Musculoskeletal: Negative.   Skin: Negative.   Allergic/Immunologic: Negative.   Neurological: Negative.   Hematological: Negative.   Psychiatric/Behavioral: Positive for sleep disturbance. Negative for agitation, behavioral problems, confusion, decreased concentration, dysphoric mood and hallucinations. The patient is not nervous/anxious and is not hyperactive.   All other systems reviewed and are negative.      Objective:    BP 102/70   Pulse 63   Ht 5\' 3"  (1.6 m)   Wt 128 lb 8 oz (  58.3 kg)   SpO2 97%   BMI 22.76 kg/m    Physical Exam  Constitutional: She is oriented to person, place, and time. She appears well-developed and well-nourished. No distress.  HENT:  Head: Normocephalic and atraumatic.  Eyes: Conjunctivae are normal.  Cardiovascular: Normal rate and regular rhythm.   Pulmonary/Chest: Effort normal and breath sounds normal.  Abdominal: Soft. Bowel sounds are normal.  Musculoskeletal: Normal range of motion. She exhibits no edema.  Neurological: She is alert and oriented to person, place, and time. She displays normal reflexes. No cranial nerve deficit. She exhibits normal muscle tone. Coordination normal.  Skin: Skin is warm and dry. She is not diaphoretic.  Psychiatric: She has a normal mood and affect. Her behavior is normal. Judgment and thought content normal.  Nursing note and vitals reviewed.         Assessment & Plan:   Well woman exam  Other insomnia  Depression, unspecified depression type  Atrial fibrillation, unspecified type ([place])  Essential hypertension  Other specified hypothyroidism  Cerebrovascular disease  Gastroesophageal reflux disease without esophagitis  Osteoporosis without current pathological fracture, unspecified osteoporosis type No Follow-up on file.

## 2017-04-23 NOTE — Assessment & Plan Note (Signed)
Improved with Prolia.

## 2017-04-23 NOTE — Telephone Encounter (Signed)
Pt left v/m wanting to verify that Prior auth for nexium was received from pharmacy and that the PA process had been started. [place] is the ins co that is requiring the PA.

## 2017-04-23 NOTE — Assessment & Plan Note (Signed)
Continue trazodone 

## 2017-04-23 NOTE — Assessment & Plan Note (Signed)
Stable on current rxs. No changes made. 

## 2017-04-23 NOTE — Progress Notes (Signed)
Pre visit review using our clinic review tool, if applicable. No additional management support is needed unless otherwise documented below in the visit note. 

## 2017-04-23 NOTE — Assessment & Plan Note (Signed)
Well controlled. No changes made to rxs. 

## 2017-04-28 NOTE — Telephone Encounter (Signed)
Pt left v/m requesting cb with status of PA for nexium. Other meds pt has tried has not worked.

## 2017-05-04 ENCOUNTER — Other Ambulatory Visit: Payer: Self-pay | Admitting: *Deleted

## 2017-05-04 NOTE — Telephone Encounter (Signed)
Nexium was approved through 05/04/2018 Pt was notified.

## 2017-05-04 NOTE — Telephone Encounter (Signed)
Pt left voicemail at Triage checking the status of her PA. Pt said she is almost out of med and still haven't heard anything about the PA. Pt said we can call directly at [phone] then option 2 then option 3 then we can do the PA over the phone, pt request call back ASAP

## 2017-05-05 ENCOUNTER — Telehealth: Payer: Self-pay

## 2017-05-05 MED ORDER — NEXIUM 40 MG PO CPDR: 40.0000 mg | DELAYED_RELEASE_CAPSULE | Freq: Every day | ORAL | 1 refills | Status: DC

## 2017-05-05 NOTE — Telephone Encounter (Signed)
[facility] called pt wants 90 day supply of nexium; [facility] will chg qty on 04/23/17 to # 90 x 1.

## 2017-07-03 ENCOUNTER — Telehealth: Payer: Self-pay | Admitting: Family Medicine

## 2017-07-03 NOTE — Telephone Encounter (Signed)
Caller Name:Rhylynn Barnet Pall   Relationship to Patient:self   Best number:[phone] Pharmacy:  Reason for call: wants to put in order/get things started for prolia.

## 2017-07-10 NOTE — Telephone Encounter (Signed)
Lm on pts vm requesting a call back 

## 2017-07-10 NOTE — Telephone Encounter (Signed)
Caller Name: Tonie Elsey  Relationship to Patient: self   Best Number: [phone]  Pharmacy:   Reason for call: pt does want to proceed with prolia injection process

## 2017-07-13 ENCOUNTER — Telehealth: Payer: Self-pay | Admitting: *Deleted

## 2017-07-13 NOTE — Telephone Encounter (Signed)
Patient returned [facility] call.  Patient can be reached at [phone].

## 2017-07-13 NOTE — Telephone Encounter (Signed)
Pt advised and Ca lab scheduled for 9/11; injection 10/2

## 2017-07-13 NOTE — Telephone Encounter (Signed)
Spoke to pt and scheduled Ca lab and prolia inj

## 2017-07-13 NOTE — Telephone Encounter (Signed)
Verification of benefits have been processed and an approval has been received for pts prolia injection. Pts estimated cost are appx $0. This is only an estimate and cannot be confirmed until benefits are paid. Please advise pt and schedule if needed. If scheduled, once the injection is received, pls contact me back with the date it was received so that I am able to update prolia folder. thanks  

## 2017-07-14 ENCOUNTER — Other Ambulatory Visit (INDEPENDENT_AMBULATORY_CARE_PROVIDER_SITE_OTHER): Payer: Medicare Other

## 2017-07-14 DIAGNOSIS — Q782 Osteopetrosis: Secondary | ICD-10-CM | POA: Diagnosis not present

## 2017-07-14 LAB — CALCIUM: CALCIUM: 9.2 mg/dL (ref 8.4–10.5)

## 2017-07-14 NOTE — Progress Notes (Signed)
HPI: FU atrial fibrillation and TIA. Echocardiogram in January of 2014 showed normal LV function, mild left ventricular hypertrophy, mild aortic insufficiency. A patent foramen ovale could not be excluded. Brain MRI in January 2014 showed no acute infarct, mild small vessel disease. TEE in February of 2014. Her LV function was normal. There was mild mitral valve prolapse with mild mitral regurgitation. There was trace aortic insufficiency. There was no PFO. Monitor in March of 2014 showed paroxysmal atrial fibrillation/flutter. She was therefore started on anticoagulation. Carotid Dopplers February 2018 showed 1-39% bilateral stenosis. Since I last saw her the patient denies any dyspnea on exertion, orthopnea, PND, pedal edema, palpitations, syncope or chest pain. No bleeding.    Current Outpatient Prescriptions  Medication Sig Dispense Refill  . Alpha-D-Galactosidase (BEANO PO) Take by mouth 2 (two) times daily.    Marland Kitchen atorvastatin (LIPITOR) 20 MG tablet TAKE 1 TABLET BY MOUTH DAILY 90 tablet 2  . Bepotastine Besilate (BEPREVE) 1.5 % SOLN Use one drop in each eye twice a day    . bumetanide (BUMEX) 1 MG tablet Take 1 tablet (1 mg total) by mouth 2 (two) times daily. 60 tablet 6  . buPROPion (WELLBUTRIN) 75 MG tablet Take 1 tablet (75 mg total) by mouth 2 (two) times daily. 120 tablet 3  . Calcium Carbonate-Vitamin D (CALCIUM 600/VITAMIN D) 600-400 MG-UNIT per tablet Take 1 tablet by mouth 2 (two) times daily.      . Cholecalciferol (VITAMIN D-3) 5000 UNITS TABS Take one tablet by mouth three times a week    . clonazePAM (KLONOPIN) 0.5 MG tablet Take 1 tablet (0.5 mg total) by mouth at bedtime as needed for anxiety. 30 tablet 1  . cyclobenzaprine (FLEXERIL) 10 MG tablet Take 0.5 to 1 tablet at bedtime as needed for muscle spasm 20 tablet 0  . denosumab (PROLIA) 60 MG/ML SOLN injection Inject 60 mg into the skin once. Administer in upper arm, thigh, or abdomen 180 mL 0  . diclofenac sodium  (VOLTAREN) 1 % GEL Apply as needed 9 Tube 3  . famotidine (PEPCID AC) 10 MG chewable tablet Chew 10 mg by mouth daily.    . fexofenadine (ALLEGRA) 60 MG tablet Take 1 tablet (60 mg total) by mouth 2 (two) times daily. (Patient taking differently: Take 60 mg by mouth 2 (two) times daily. PATIENT TAKES 180MG  AM) 60 tablet 3  . Fluticasone-Salmeterol (ADVAIR DISKUS) 100-50 MCG/DOSE AEPB Inhale 1 puff into the lungs 2 (two) times daily. 180 each 1  . levocetirizine (XYZAL) 5 MG tablet Take 5 mg by mouth daily.  1  . levothyroxine (SYNTHROID, LEVOTHROID) 50 MCG tablet Take 1 tablet (50 mcg total) by mouth daily. 30 tablet 6  . Melatonin 5 MG TABS Take 3 tablets my mouth at bedtime    . metoprolol succinate (TOPROL-XL) 25 MG 24 hr tablet TAKE 1 TABLET BY MOUTH DAILY. NEED APPOINTMENT FOR REFILLS. (Patient taking differently: TAKE 1 TABLET BY MOUTH DAILY.) 90 tablet 1  . NEXIUM 40 MG capsule Take 1 capsule (40 mg total) by mouth daily. 90 capsule 1  . nystatin (MYCOSTATIN) 100000 UNIT/ML suspension SWISH BY MOUTH 10 ML UP TO 4 TIMES A DAY 240 mL 1  . potassium chloride (K-DUR) 10 MEQ tablet TAKE 1 TABLET BY MOUTH 2 TIMES A DAY. 180 tablet 2  . traZODone (DESYREL) 50 MG tablet TAKE 3 TABLETS BY MOUTH AT BEDTIME. 270 tablet 2  . XARELTO 20 MG TABS tablet TAKE 1 TABLET  BY MOUTH DAILY. 90 tablet 2   No current facility-administered medications for this visit.      Past Medical History:  Diagnosis Date  . Allergy   . Arthritis    hands  . Asthma   . Atrial fibrillation ([place])   . Atrial flutter ([place])   . Depression   . Dysrhythmia    a-fib, on Xarelto  . GERD (gastroesophageal reflux disease)   . Hyperlipidemia   . Hypertension   . Hypothyroidism   . Osteoporosis   . TIA (transient ischemic attack)     Past Surgical History:  Procedure Laterality Date  . ABDOMINAL HYSTERECTOMY    . COLONOSCOPY    . MYRINGOTOMY WITH TUBE PLACEMENT Right 01/14/2016   Procedure: RIGHT MYRINGOTOMY WITH T-TUBE  PLACEMENT;  Surgeon: Leta Baptist, MD;  Location: [facility];  Service: ENT;  Laterality: Right;  . PARTIAL HYSTERECTOMY  1989  . TEE WITHOUT CARDIOVERSION N/A 12/22/2012   Procedure: TRANSESOPHAGEAL ECHOCARDIOGRAM (TEE);  Surgeon: Lelon Perla, MD;  Location: [facility] ENDOSCOPY;  Service: Cardiovascular;  Laterality: N/A;    Social History   Social History  . Marital status: Married    Spouse name: N/A  . Number of children: 2  . Years of education: N/A   Occupational History  .  Retired   Social History Main Topics  . Smoking status: Never Smoker  . Smokeless tobacco: Never Used  . Alcohol use Yes     Comment: Rare  . Drug use: No  . Sexual activity: Not on file   Other Topics Concern  . Not on file   Social History Narrative   Does have HPOA- sons Eddie Dibbles and Wille Glaser.   Does desire life support if not futile.   Not sure about feeding tubes.    Family History  Problem Relation Age of Onset  . Breast cancer Mother   . Drug abuse Son   . Depression Son   . Stroke Paternal Aunt   . Heart disease Paternal Grandfather   . Diabetes Maternal Grandfather     ROS: no fevers or chills, productive cough, hemoptysis, dysphasia, odynophagia, melena, hematochezia, dysuria, hematuria, rash, seizure activity, orthopnea, PND, pedal edema, claudication. Remaining systems are negative.  Physical Exam: Well-developed well-nourished in no acute distress.  Skin is warm and dry.  HEENT is normal.  Neck is supple. Left carotid bruit  Chest is clear to auscultation with normal expansion.  Cardiovascular exam is regular rate and rhythm.  Abdominal exam nontender or distended. No masses palpated. Extremities show no edema. neuro grossly intact  ECG- Sinus rhythm at a rate of 75. Nonspecific ST changes. Prolonged QT interval. personally reviewed  A/P  1 paroxysmal atrial fibrillation-Pt is in sinus rhythm today. Plan to continue Toprol for rate control if atrial fibrillation  recurs. Continue xarelto.  2 hypertension-blood pressure is mildly elevated; increase metoprolol to 50 mg daily and follow..   3 carotid artery disease-continue statin. Mild on most recent carotid Dopplers.   Kirk Ruths, MD

## 2017-07-20 ENCOUNTER — Encounter: Payer: Self-pay | Admitting: Cardiology

## 2017-07-20 ENCOUNTER — Ambulatory Visit (INDEPENDENT_AMBULATORY_CARE_PROVIDER_SITE_OTHER): Payer: Medicare Other | Admitting: Cardiology

## 2017-07-20 ENCOUNTER — Telehealth: Payer: Self-pay | Admitting: *Deleted

## 2017-07-20 ENCOUNTER — Other Ambulatory Visit: Payer: Self-pay | Admitting: Family Medicine

## 2017-07-20 ENCOUNTER — Other Ambulatory Visit: Payer: Self-pay | Admitting: Cardiology

## 2017-07-20 VITALS — BP 142/80 | HR 75 | Ht 63.0 in | Wt 134.6 lb

## 2017-07-20 DIAGNOSIS — I480 Paroxysmal atrial fibrillation: Secondary | ICD-10-CM | POA: Diagnosis not present

## 2017-07-20 DIAGNOSIS — I10 Essential (primary) hypertension: Secondary | ICD-10-CM | POA: Diagnosis not present

## 2017-07-20 DIAGNOSIS — I6523 Occlusion and stenosis of bilateral carotid arteries: Secondary | ICD-10-CM | POA: Diagnosis not present

## 2017-07-20 MED ORDER — METOPROLOL SUCCINATE ER 25 MG PO TB24: 75.0000 mg | ORAL_TABLET | Freq: Every day | ORAL | 3 refills | Status: DC

## 2017-07-20 MED ORDER — CLONAZEPAM 0.5 MG PO TABS: 0.5000 mg | ORAL_TABLET | Freq: Every evening | ORAL | 1 refills | Status: DC | PRN

## 2017-07-20 NOTE — Telephone Encounter (Signed)
Received faxed refill request from pharmacy. Last refill 04/23/17 #30/1 Last office visit-same date.

## 2017-07-20 NOTE — Patient Instructions (Signed)
Medication Instructions:   INCREASE METOPROLOL TO 75 MG ONCE DAILY= 3 OF THE 25 MG TABLETS ONCE DAILY  Follow-Up:  Your physician wants you to follow-up in: [place] will receive a reminder letter in the mail two months in advance. If you don't receive a letter, please call our office to schedule the follow-up appointment.   If you need a refill on your cardiac medications before your next appointment, please call your pharmacy.

## 2017-07-21 NOTE — Telephone Encounter (Signed)
Please apologize to her.  I am not sure why that happened but yes she can get #90.

## 2017-07-21 NOTE — Telephone Encounter (Signed)
Left refill on voice mail at pharmacy  

## 2017-07-21 NOTE — Telephone Encounter (Addendum)
Pt wanted to know why she cannot get # 90 for clonazepam instead of # 30. Pt said on 04/23/17 the clonazepam was for qty # 90. I spoke with Colletta Maryland at [facility] and per the pharmacy records # 90 x 0 was called in on 04/23/17. On hx med list has # 30 x 1 refill on 04/23/17. I spoke with Leafy Ro RN team lead and she said to tell pt we will send note to Dr Deborra Medina for review and we will investigate what occurred with the 04/23/17 clonazepam refill. Pt request cb with resolve. Will route to Dr Deborra Medina and Leafy Ro RN.

## 2017-07-22 NOTE — Telephone Encounter (Signed)
Spoke to pt. She said we can wait and change the quantity the next time it is filled.

## 2017-07-23 ENCOUNTER — Ambulatory Visit (INDEPENDENT_AMBULATORY_CARE_PROVIDER_SITE_OTHER): Payer: Medicare Other | Admitting: Physician Assistant

## 2017-07-23 ENCOUNTER — Ambulatory Visit (INDEPENDENT_AMBULATORY_CARE_PROVIDER_SITE_OTHER): Payer: Medicare Other

## 2017-07-23 ENCOUNTER — Encounter: Payer: Self-pay | Admitting: Physician Assistant

## 2017-07-23 VITALS — BP 136/71 | HR 70 | Temp 100.1°F | Resp 16 | Ht 63.0 in | Wt 133.4 lb

## 2017-07-23 DIAGNOSIS — R05 Cough: Secondary | ICD-10-CM

## 2017-07-23 DIAGNOSIS — R4182 Altered mental status, unspecified: Secondary | ICD-10-CM

## 2017-07-23 DIAGNOSIS — R059 Cough, unspecified: Secondary | ICD-10-CM

## 2017-07-23 DIAGNOSIS — N3001 Acute cystitis with hematuria: Secondary | ICD-10-CM

## 2017-07-23 LAB — POCT URINALYSIS DIP (MANUAL ENTRY)
Bilirubin, UA: NEGATIVE
Glucose, UA: NEGATIVE mg/dL
Ketones, POC UA: NEGATIVE mg/dL
Nitrite, UA: NEGATIVE
Spec Grav, UA: 1.015 (ref 1.010–1.025)
Urobilinogen, UA: 1 E.U./dL
pH, UA: 6 (ref 5.0–8.0)

## 2017-07-23 LAB — POCT CBC
Granulocyte percent: 70.4 % (ref 37–80)
HCT, POC: 39 % (ref 37.7–47.9)
Hemoglobin: 12.8 g/dL (ref 12.2–16.2)
Lymph, poc: 1.4 (ref 0.6–3.4)
MCH, POC: 29 pg (ref 27–31.2)
MCHC: 32.9 g/dL (ref 31.8–35.4)
MCV: 88 fL (ref 80–97)
MID (cbc): 0.7 (ref 0–0.9)
MPV: 8.1 fL (ref 0–99.8)
POC Granulocyte: 4.8 (ref 2–6.9)
POC LYMPH PERCENT: 20 %L (ref 10–50)
POC MID %: 9.6 % (ref 0–12)
Platelet Count, POC: 199 10*3/uL (ref 142–424)
RBC: 4.42 M/uL (ref 4.04–5.48)
RDW, POC: 13 %
WBC: 6.8 10*3/uL (ref 4.6–10.2)

## 2017-07-23 LAB — POC MICROSCOPIC URINALYSIS (UMFC): Mucus: ABSENT

## 2017-07-23 MED ORDER — SULFAMETHOXAZOLE-TRIMETHOPRIM 800-160 MG PO TABS: 1.0000 | ORAL_TABLET | Freq: Two times a day (BID) | ORAL | 0 refills | Status: AC

## 2017-07-23 MED ORDER — BENZONATATE 100 MG PO CAPS: 100.0000 mg | ORAL_CAPSULE | Freq: Three times a day (TID) | ORAL | 0 refills | Status: DC | PRN

## 2017-07-23 NOTE — Progress Notes (Signed)
Patricia Peck  MRN: [id number] DOB: 1946/08/26  PCP: Lucille Passy, MD  Subjective:  Pt is a 71 year old female PMH HTN, TIA, A fib, GERD, IBS, depression, hypothyroidism who presents to clinic for cough x 1 month and sore throat x 5 days. She is here today with her husband.   Her husband states "she was babbling, I don't know what language" Her whole body was very hot. Very early morning she was trying to put her makeup on in the dark bathroom. This is out of the normal realm of her behavior. He also endorses she has been nearly falling often recently.   Pt recalls these events. She has been taking mucinex x 4 days.   She denies increased urinary frequency, urgency, burning, back pain, abdominal pain, flank pain, fever, chills, n/v/d.  Review of Systems  Constitutional: Negative for chills, diaphoresis, fatigue and fever.  HENT: Positive for congestion and sore throat. Negative for sinus pain and sinus pressure.   Respiratory: Positive for cough. Negative for shortness of breath and wheezing.   Cardiovascular: Negative for chest pain and palpitations.  Gastrointestinal: Negative for abdominal pain, diarrhea, nausea and vomiting.  Genitourinary: Negative for decreased urine volume, difficulty urinating, dysuria, enuresis, flank pain, frequency, hematuria and urgency.  Musculoskeletal: Negative for back pain.  Neurological: Negative for dizziness, weakness, light-headedness and headaches.  Psychiatric/Behavioral: Positive for behavioral problems.    Patient Active Problem List   Diagnosis Date Noted  . Well woman exam 04/18/2017  . IBS (irritable bowel syndrome) 04/11/2013  . Atrial fibrillation ([place]) 01/27/2013  . Cerebrovascular disease 01/27/2013  . TIA (transient ischemic attack) 12/14/2012  . Insomnia 11/02/2012  . GERD 03/12/2010  . Hypothyroidism 04/14/2007  . Depression 04/14/2007  . Essential hypertension 04/14/2007  . Osteoporosis 04/14/2007    Current  Outpatient Prescriptions on File Prior to Visit  Medication Sig Dispense Refill  . Alpha-D-Galactosidase (BEANO PO) Take by mouth 2 (two) times daily.    Marland Kitchen atorvastatin (LIPITOR) 20 MG tablet TAKE 1 TABLET BY MOUTH DAILY 90 tablet 3  . Bepotastine Besilate (BEPREVE) 1.5 % SOLN Use one drop in each eye twice a day    . bumetanide (BUMEX) 1 MG tablet Take 1 tablet (1 mg total) by mouth 2 (two) times daily. 60 tablet 6  . buPROPion (WELLBUTRIN) 75 MG tablet Take 1 tablet (75 mg total) by mouth 2 (two) times daily. 120 tablet 3  . Calcium Carbonate-Vitamin D (CALCIUM 600/VITAMIN D) 600-400 MG-UNIT per tablet Take 1 tablet by mouth 2 (two) times daily.      . Cholecalciferol (VITAMIN D-3) 5000 UNITS TABS Take one tablet by mouth three times a week    . clonazePAM (KLONOPIN) 0.5 MG tablet Take 1 tablet (0.5 mg total) by mouth at bedtime as needed for anxiety. 30 tablet 1  . cyclobenzaprine (FLEXERIL) 10 MG tablet Take 0.5 to 1 tablet at bedtime as needed for muscle spasm 20 tablet 0  . denosumab (PROLIA) 60 MG/ML SOLN injection Inject 60 mg into the skin once. Administer in upper arm, thigh, or abdomen 180 mL 0  . diclofenac sodium (VOLTAREN) 1 % GEL Apply as needed 9 Tube 3  . famotidine (PEPCID AC) 10 MG chewable tablet Chew 10 mg by mouth daily.    . fexofenadine (ALLEGRA) 60 MG tablet Take 1 tablet (60 mg total) by mouth 2 (two) times daily. (Patient taking differently: Take 60 mg by mouth 2 (two) times daily. PATIENT TAKES 180MG  AM) 60  tablet 3  . Fluticasone-Salmeterol (ADVAIR DISKUS) 100-50 MCG/DOSE AEPB Inhale 1 puff into the lungs 2 (two) times daily. 180 each 1  . levocetirizine (XYZAL) 5 MG tablet Take 5 mg by mouth daily.  1  . levothyroxine (SYNTHROID, LEVOTHROID) 50 MCG tablet Take 1 tablet (50 mcg total) by mouth daily. 30 tablet 6  . Melatonin 5 MG TABS Take 3 tablets my mouth at bedtime    . metoprolol succinate (TOPROL-XL) 25 MG 24 hr tablet Take 3 tablets (75 mg total) by mouth daily.  270 tablet 3  . NEXIUM 40 MG capsule Take 1 capsule (40 mg total) by mouth daily. 90 capsule 1  . nystatin (MYCOSTATIN) 100000 UNIT/ML suspension SWISH BY MOUTH 10 ML UP TO 4 TIMES A DAY 240 mL 1  . potassium chloride (K-DUR) 10 MEQ tablet TAKE 1 TABLET BY MOUTH 2 TIMES A DAY. 180 tablet 3  . traZODone (DESYREL) 50 MG tablet TAKE 3 TABLETS BY MOUTH AT BEDTIME. 270 tablet 2  . XARELTO 20 MG TABS tablet TAKE 1 TABLET BY MOUTH DAILY. 90 tablet 3   No current facility-administered medications on file prior to visit.     No Known Allergies   Objective:  BP 136/71   Pulse 70   Temp 100.1 F (37.8 C) (Oral)   Resp 16   Ht 5\' 3"  (1.6 m)   Wt 133 lb 6.4 oz (60.5 kg)   SpO2 94%   BMI 23.63 kg/m   Physical Exam  Constitutional: She is oriented to person, place, and time and well-developed, well-nourished, and in no distress. She appears healthy. She does not have a sickly appearance. No distress.  Cardiovascular: Normal rate, regular rhythm and normal heart sounds.   Pulmonary/Chest: Effort normal and breath sounds normal. She has no wheezes. She has no rhonchi. She has no rales.  Abdominal: Soft. Normal appearance. There is no tenderness. There is no CVA tenderness.  Neurological: She is alert and oriented to person, place, and time. GCS score is 15.  Skin: Skin is warm and dry.  Pt has thick, uneven and disheveled application of make-up on b/l cheeks and chin.   Psychiatric: Mood, affect and judgment normal. She exhibits abnormal recent memory.  Vitals reviewed.   Dg Chest 2 View  Result Date: 07/23/2017 CLINICAL DATA:  Cough for 1 month EXAM: CHEST  2 VIEW COMPARISON:  Chest x-ray of 11/14/2014 FINDINGS: The lungs remain clear and minimally hyperaerated. Mediastinal and hilar contours are unremarkable. Mild cardiomegaly is stable. No acute bony abnormality is seen. IMPRESSION: No active lung disease.  Stable mild hyperaeration. Electronically Signed   By: Ivar Drape M.D.   On:  07/23/2017 12:15   Results for orders placed or performed in visit on 07/23/17  POCT Microscopic Urinalysis ([facility])  Result Value Ref Range   WBC,UR,HPF,POC Few (A) None WBC/hpf   RBC,UR,HPF,POC None None RBC/hpf   Bacteria Many (A) None, Too numerous to count   Mucus Absent Absent   Epithelial Cells, UR Per Microscopy None None, Too numerous to count cells/hpf  POCT urinalysis dipstick  Result Value Ref Range   Color, UA yellow yellow   Clarity, UA clear clear   Glucose, UA negative negative mg/dL   Bilirubin, UA negative negative   Ketones, POC UA negative negative mg/dL   Spec Grav, UA 1.015 1.010 - 1.025   Blood, UA small (A) negative   pH, UA 6.0 5.0 - 8.0   Protein Ur, POC trace (A) negative mg/dL  Urobilinogen, UA 1.0 0.2 or 1.0 E.U./dL   Nitrite, UA Negative Negative   Leukocytes, UA Small (1+) (A) Negative  POCT CBC  Result Value Ref Range   WBC 6.8 4.6 - 10.2 K/uL   Lymph, poc 1.4 0.6 - 3.4   POC LYMPH PERCENT 20.0 10 - 50 %L   MID (cbc) 0.7 0 - 0.9   POC MID % 9.6 0 - 12 %M   POC Granulocyte 4.8 2 - 6.9   Granulocyte percent 70.4 37 - 80 %G   RBC 4.42 4.04 - 5.48 M/uL   Hemoglobin 12.8 12.2 - 16.2 g/dL   HCT, POC 39.0 37.7 - 47.9 %   MCV 88.0 80 - 97 fL   MCH, POC 29.0 27 - 31.2 pg   MCHC 32.9 31.8 - 35.4 g/dL   RDW, POC 13.0 %   Platelet Count, POC 199 142 - 424 K/uL   MPV 8.1 0 - 99.8 fL    Assessment and Plan :  1. Altered mental status, unspecified altered mental status type - POCT Microscopic Urinalysis ([facility]) - POCT urinalysis dipstick - POCT CBC - Urine Culture  2. Acute cystitis with hematuria - sulfamethoxazole-trimethoprim (BACTRIM DS,SEPTRA DS) 800-160 MG tablet; Take 1 tablet by mouth 2 (two) times daily.  Dispense: 6 tablet; Refill: 0  3. Cough - DG Chest 2 View; Future - benzonatate (TESSALON) 100 MG capsule; Take 1-2 capsules (100-200 mg total) by mouth 3 (three) times daily as needed for cough.  Dispense: 40 capsule; Refill: 0  - Pt  has recent alteration in behavior with low grade fever. negative chest x-ray, WBC count is wnl. UA dip and micro suggest UTI. Plan to treat and have her come back for close f/u in 48 hours to ensure resolution. Culture is pending.    Mercer Pod, PA-C  [facility] [place] 07/23/2017 10:49 AM

## 2017-07-23 NOTE — Patient Instructions (Addendum)
I am treating your for UTI today. Start taking your antibiotics today. Take the ENTIRE COURSE even if you start to feel better.   Con't taking your Mucinex for the next 2-3 days. Drink plenty of water.   Tessalon (Benzonatate) is for cough.  Come back and see me on Saturday.   Your chest x-ray is clear.   Thank you for coming in today. I hope you feel we met your needs.  Feel free to call PCP if you have any questions or further requests.  Please consider signing up for MyChart if you do not already have it, as this is a great way to communicate with me.  Best,  Whitney McVey, PA-C   IF you received an x-ray today, you will receive an invoice from [facility]. Please contact [facility] at [phone] with questions or concerns regarding your invoice.   IF you received labwork today, you will receive an invoice from [facility]. Please contact [facility] at [phone] with questions or concerns regarding your invoice.   Our billing staff will not be able to assist you with questions regarding bills from these companies.  You will be contacted with the lab results as soon as they are available. The fastest way to get your results is to activate your My Chart account. Instructions are located on the last page of this paperwork. If you have not heard from [place] regarding the results in 2 weeks, please contact this office.

## 2017-07-25 ENCOUNTER — Ambulatory Visit (INDEPENDENT_AMBULATORY_CARE_PROVIDER_SITE_OTHER): Payer: Medicare Other | Admitting: Physician Assistant

## 2017-07-25 ENCOUNTER — Encounter: Payer: Self-pay | Admitting: Physician Assistant

## 2017-07-25 VITALS — BP 135/79 | HR 77 | Temp 98.6°F | Resp 16 | Ht 63.5 in | Wt 128.0 lb

## 2017-07-25 DIAGNOSIS — H6690 Otitis media, unspecified, unspecified ear: Secondary | ICD-10-CM

## 2017-07-25 DIAGNOSIS — R05 Cough: Secondary | ICD-10-CM | POA: Diagnosis not present

## 2017-07-25 DIAGNOSIS — H6122 Impacted cerumen, left ear: Secondary | ICD-10-CM

## 2017-07-25 DIAGNOSIS — R059 Cough, unspecified: Secondary | ICD-10-CM

## 2017-07-25 DIAGNOSIS — N3000 Acute cystitis without hematuria: Secondary | ICD-10-CM | POA: Diagnosis not present

## 2017-07-25 LAB — POCT URINALYSIS DIP (MANUAL ENTRY)
Blood, UA: NEGATIVE
Glucose, UA: NEGATIVE mg/dL
Ketones, POC UA: NEGATIVE mg/dL
Leukocytes, UA: NEGATIVE
Nitrite, UA: NEGATIVE
Spec Grav, UA: 1.025 (ref 1.010–1.025)
Urobilinogen, UA: 0.2 E.U./dL
pH, UA: 5.5 (ref 5.0–8.0)

## 2017-07-25 LAB — POC MICROSCOPIC URINALYSIS (UMFC): Mucus: ABSENT

## 2017-07-25 MED ORDER — AMOXICILLIN 500 MG PO CAPS: 1000.0000 mg | ORAL_CAPSULE | Freq: Three times a day (TID) | ORAL | 0 refills | Status: AC

## 2017-07-25 NOTE — Progress Notes (Signed)
Patricia Peck  MRN: [id number] DOB: 1945-11-25  PCP: Lucille Passy, MD  Subjective:  Pt is a 71 year old female who PMH HTN, TIA, A fib, GERD, IBS, depression, hypothyroidism who presents to clinic for f/u cough and UTI. She is here today with her husband.  She was here two days ago with episodes of altered mental status, c/o cough x 1 month and sore throat x 5 days.   OV 9/20 HPI "Her husband states "she was babbling, I don't know what language" Her whole body was very hot. Very early morning she was trying to put her makeup on in the dark bathroom. This is out of the normal realm of her behavior. He also endorses she has been nearly falling often recently."   Today she says her cough has been x 1 week, not one month. She is still taking mucinex.  Her husband says the afternoon following her last OV pt was in the restroom and he heard her fall into the shower. He laid with her for about an hour until she could get up. She feel again as he tried to get her into the bed. She was on the floor for about 2 hours. He was able to get her into the bed and she slept.   Pt has been taking Bactrim as prescribed since her last OV. They report no other episodes of falling or altered mental status. She endorses one episode of burning with urination yesterday.  Denies fever, chills, abdominal pain, back pain, flank pain.   Review of Systems  Constitutional: Negative for chills, fatigue and fever.  Respiratory: Negative for cough, shortness of breath and wheezing.   Cardiovascular: Negative for chest pain and palpitations.  Gastrointestinal: Negative for abdominal pain, diarrhea, nausea and vomiting.  Genitourinary: Positive for dysuria. Negative for decreased urine volume, difficulty urinating, enuresis, flank pain, frequency, hematuria and urgency.  Musculoskeletal: Negative for back pain.  Neurological: Positive for dizziness and weakness. Negative for light-headedness and headaches.    Patient  Active Problem List   Diagnosis Date Noted  . Well woman exam 04/18/2017  . IBS (irritable bowel syndrome) 04/11/2013  . Atrial fibrillation ([place]) 01/27/2013  . Cerebrovascular disease 01/27/2013  . TIA (transient ischemic attack) 12/14/2012  . Insomnia 11/02/2012  . GERD 03/12/2010  . Hypothyroidism 04/14/2007  . Depression 04/14/2007  . Essential hypertension 04/14/2007  . Osteoporosis 04/14/2007    Current Outpatient Prescriptions on File Prior to Visit  Medication Sig Dispense Refill  . Alpha-D-Galactosidase (BEANO PO) Take by mouth 2 (two) times daily.    Marland Kitchen atorvastatin (LIPITOR) 20 MG tablet TAKE 1 TABLET BY MOUTH DAILY (Patient taking differently: TAKE 1 TABLET THREE TIMES BY MOUTH DAILY) 90 tablet 3  . benzonatate (TESSALON) 100 MG capsule Take 1-2 capsules (100-200 mg total) by mouth 3 (three) times daily as needed for cough. 40 capsule 0  . Bepotastine Besilate (BEPREVE) 1.5 % SOLN Use one drop in each eye twice a day    . bumetanide (BUMEX) 1 MG tablet Take 1 tablet (1 mg total) by mouth 2 (two) times daily. 60 tablet 6  . buPROPion (WELLBUTRIN) 75 MG tablet Take 1 tablet (75 mg total) by mouth 2 (two) times daily. 120 tablet 3  . Calcium Carbonate-Vitamin D (CALCIUM 600/VITAMIN D) 600-400 MG-UNIT per tablet Take 1 tablet by mouth 2 (two) times daily.      . Cholecalciferol (VITAMIN D-3) 5000 UNITS TABS Take one tablet by mouth three times a week    .  clonazePAM (KLONOPIN) 0.5 MG tablet Take 1 tablet (0.5 mg total) by mouth at bedtime as needed for anxiety. 30 tablet 1  . cyclobenzaprine (FLEXERIL) 10 MG tablet Take 0.5 to 1 tablet at bedtime as needed for muscle spasm 20 tablet 0  . denosumab (PROLIA) 60 MG/ML SOLN injection Inject 60 mg into the skin once. Administer in upper arm, thigh, or abdomen 180 mL 0  . diclofenac sodium (VOLTAREN) 1 % GEL Apply as needed 9 Tube 3  . famotidine (PEPCID AC) 10 MG chewable tablet Chew 10 mg by mouth daily.    . fexofenadine (ALLEGRA)  60 MG tablet Take 1 tablet (60 mg total) by mouth 2 (two) times daily. (Patient taking differently: Take 60 mg by mouth 2 (two) times daily. PATIENT TAKES 180MG  AM) 60 tablet 3  . Fluticasone-Salmeterol (ADVAIR DISKUS) 100-50 MCG/DOSE AEPB Inhale 1 puff into the lungs 2 (two) times daily. 180 each 1  . levocetirizine (XYZAL) 5 MG tablet Take 5 mg by mouth daily.  1  . levothyroxine (SYNTHROID, LEVOTHROID) 50 MCG tablet Take 1 tablet (50 mcg total) by mouth daily. 30 tablet 6  . Melatonin 5 MG TABS Take 3 tablets my mouth at bedtime    . metoprolol succinate (TOPROL-XL) 25 MG 24 hr tablet Take 3 tablets (75 mg total) by mouth daily. 270 tablet 3  . NEXIUM 40 MG capsule Take 1 capsule (40 mg total) by mouth daily. 90 capsule 1  . nystatin (MYCOSTATIN) 100000 UNIT/ML suspension SWISH BY MOUTH 10 ML UP TO 4 TIMES A DAY 240 mL 1  . potassium chloride (K-DUR) 10 MEQ tablet TAKE 1 TABLET BY MOUTH 2 TIMES A DAY. 180 tablet 3  . sulfamethoxazole-trimethoprim (BACTRIM DS,SEPTRA DS) 800-160 MG tablet Take 1 tablet by mouth 2 (two) times daily. 6 tablet 0  . traZODone (DESYREL) 50 MG tablet TAKE 3 TABLETS BY MOUTH AT BEDTIME. 270 tablet 2  . XARELTO 20 MG TABS tablet TAKE 1 TABLET BY MOUTH DAILY. 90 tablet 3   No current facility-administered medications on file prior to visit.     No Known Allergies   Objective:  BP 135/79   Pulse 77   Temp 98.6 F (37 C) (Oral)   Resp 16   Ht 5' 3.5" (1.613 m)   Wt 128 lb (58.1 kg)   SpO2 96%   BMI 22.32 kg/m   Physical Exam  Constitutional: She is oriented to person, place, and time and well-developed, well-nourished, and in no distress. No distress.  HENT:  Right Ear: Tympanic membrane is perforated.  Left Ear: Tympanic membrane is bulging. A middle ear effusion is present.  Mouth/Throat: Mucous membranes are normal. Posterior oropharyngeal erythema present. No oropharyngeal exudate or posterior oropharyngeal edema.  Cardiovascular: Normal rate, regular  rhythm and normal heart sounds.   Pulmonary/Chest: Effort normal and breath sounds normal. She has no wheezes. She has no rhonchi. She has no rales.  Abdominal: Soft. Normal appearance. There is no tenderness. There is no CVA tenderness.  Neurological: She is alert and oriented to person, place, and time. GCS score is 15.  Skin: Skin is warm and dry. No bruising and no lesion noted.  Psychiatric: Mood, affect and judgment normal. She exhibits abnormal recent memory.  Improved since her last OV, however some residual abnormal recent recall  Vitals reviewed.   Results for orders placed or performed in visit on 07/25/17  POCT urinalysis dipstick  Result Value Ref Range   Color, UA yellow yellow  Clarity, UA clear clear   Glucose, UA negative negative mg/dL   Bilirubin, UA small (A) negative   Ketones, POC UA negative negative mg/dL   Spec Grav, UA 1.025 1.010 - 1.025   Blood, UA negative negative   pH, UA 5.5 5.0 - 8.0   Protein Ur, POC trace (A) negative mg/dL   Urobilinogen, UA 0.2 0.2 or 1.0 E.U./dL   Nitrite, UA Negative Negative   Leukocytes, UA Negative Negative  POCT Microscopic Urinalysis ([facility])  Result Value Ref Range   WBC,UR,HPF,POC Few (A) None WBC/hpf   RBC,UR,HPF,POC None None RBC/hpf   Bacteria Few (A) None, Too numerous to count   Mucus Absent Absent   Epithelial Cells, UR Per Microscopy Few (A) None, Too numerous to count cells/hpf    Assessment and Plan :  1. Acute cystitis without hematuria - POCT urinalysis dipstick - POCT Microscopic Urinalysis ([facility]) - UA is improving. Culture shows gram negative rods. Con't Bactrim and stay well hydrated. Suspect pt's recent altered mental status and fall 2/2 UTI. RTC in 5 days for f/u.  2. Impacted cerumen of left ear - Ear wax removal  3. Subacute otitis media, unspecified otitis media type 4. Cough - amoxicillin (AMOXIL) 500 MG capsule; Take 2 capsules (1,000 mg total) by mouth 3 (three) times daily.  Dispense: 15  capsule; Refill: 0 - Plan to start Amoxil. RTC in 5 days for f/u.  Mercer Pod, PA-C  [facility] 07/25/2017 9:46 AM

## 2017-07-25 NOTE — Patient Instructions (Addendum)
Start Amoxicillin tomorrow for your ear infection  Stop taking Mucinex.  Drink plenty of water. Do not drink sugary drinks. You will probably experience a lingering cough for several weeks. This is normal. See below for home care tips.  Come back and see me in 5 days if you are not better.   Stay well hydrated.    For sore throat try using a honey-based tea. Use 3 teaspoons of honey with juice squeezed from half lemon. Place shaved pieces of ginger into 1/2-1 cup of water and warm over stove top. Then mix the ingredients and repeat every 4 hours as needed.  Cough Syrup Recipe: Sweet Lemon & Honey Thyme  Ingredients a handful of fresh thyme sprigs   1 pint of water (2 cups)  1/2 cup honey (raw is best, but regular will do)  1/2 lemon chopped Instructions 1. Place the lemon in the pint jar and cover with the honey. The honey will macerate the lemons and draw out liquids which taste so delicious! 2. Meanwhile, toss the thyme leaves into a saucepan and cover them with the water. 3. Bring the water to a gentle simmer and reduce it to half, about a cup of tea. 4. When the tea is reduced and cooled a bit, strain the sprigs & leaves, add it into the pint jar and stir it well. 5. Give it a shake and use a spoonful as needed. 6. Store your homemade cough syrup in the refrigerator for about a month.  What causes a cough? In adults, common causes of a cough include: ?An infection of the airways or lungs (such as the common cold) ?Postnasal drip - Postnasal drip is when mucus from the nose drips down or flows along the back of the throat. Postnasal drip can happen when people have: .A cold .Allergies .A sinus infection - The sinuses are hollow areas in the bones of the face that open into the nose. ?Lung conditions, like asthma and chronic obstructive pulmonary disease (COPD) - Both of these conditions can make it hard to breathe. COPD is usually caused by smoking. ?Acid reflux - Acid reflux is  when the acid that is normally in your stomach backs up into your esophagus (the tube that carries food from your mouth to your stomach). ?A side effect from blood pressure medicines called "ACE inhibitors" ?Smoking cigarettes  Is there anything I can do on my own to get rid of my cough? Yes. To help get rid of your cough, you can: ?Use a humidifier in your bedroom ?Use an over-the-counter cough medicine, or suck on cough drops or hard candy ?Stop smoking, if you smoke ?If you have allergies, avoid the things you are allergic to (like pollen, dust, animals, or mold) If you have acid reflux, your doctor or nurse will tell you which lifestyle changes can help reduce symptoms.   Thank you for coming in today. I hope you feel we met your needs.  Feel free to call PCP if you have any questions or further requests.  Please consider signing up for MyChart if you do not already have it, as this is a great way to communicate with me.  Best,  Whitney McVey, PA-C  IF you received an x-ray today, you will receive an invoice from [facility]. Please contact [facility] at [phone] with questions or concerns regarding your invoice.   IF you received labwork today, you will receive an invoice from [place]. Please contact [facility] at [phone] with questions or  concerns regarding your invoice.   Our billing staff will not be able to assist you with questions regarding bills from these companies.  You will be contacted with the lab results as soon as they are available. The fastest way to get your results is to activate your My Chart account. Instructions are located on the last page of this paperwork. If you have not heard from [place] regarding the results in 2 weeks, please contact this office.

## 2017-07-27 ENCOUNTER — Telehealth: Payer: Self-pay | Admitting: *Deleted

## 2017-07-27 LAB — URINE CULTURE

## 2017-07-27 MED ORDER — CEPHALEXIN 500 MG PO CAPS: 500.0000 mg | ORAL_CAPSULE | Freq: Two times a day (BID) | ORAL | 0 refills | Status: AC

## 2017-07-27 NOTE — Telephone Encounter (Signed)
Per [place], [place], call pt to let her know that her urine grew a different bacteria than expected. Pt advised that her antibiotic was changed to Keflex and sent to her pharmacy. Also, she is to continue taking Amoxicillin for her ear infection. Pt stated she will pickup med. She has an appt scheduled for Wednesday.

## 2017-07-27 NOTE — Progress Notes (Signed)
Plan to start Keflex. RTC in 2 days for recheck.

## 2017-07-27 NOTE — Addendum Note (Signed)
Addended by: Dorise Hiss on: 07/27/2017 03:35 PM   Modules accepted: Orders

## 2017-07-29 ENCOUNTER — Encounter: Payer: Self-pay | Admitting: Physician Assistant

## 2017-07-29 ENCOUNTER — Ambulatory Visit (INDEPENDENT_AMBULATORY_CARE_PROVIDER_SITE_OTHER): Payer: Medicare Other | Admitting: Physician Assistant

## 2017-07-29 VITALS — BP 132/88 | HR 72 | Temp 99.2°F | Resp 18 | Ht 63.5 in | Wt 128.0 lb

## 2017-07-29 DIAGNOSIS — N3000 Acute cystitis without hematuria: Secondary | ICD-10-CM | POA: Diagnosis not present

## 2017-07-29 DIAGNOSIS — R05 Cough: Secondary | ICD-10-CM | POA: Diagnosis not present

## 2017-07-29 DIAGNOSIS — R059 Cough, unspecified: Secondary | ICD-10-CM

## 2017-07-29 LAB — POCT URINALYSIS DIP (MANUAL ENTRY)
Blood, UA: NEGATIVE
Glucose, UA: NEGATIVE mg/dL
Ketones, POC UA: NEGATIVE mg/dL
Nitrite, UA: NEGATIVE
Spec Grav, UA: 1.01 (ref 1.010–1.025)
Urobilinogen, UA: 1 E.U./dL
pH, UA: 6.5 (ref 5.0–8.0)

## 2017-07-29 NOTE — Progress Notes (Signed)
Patricia Peck  MRN: [id number] DOB: 04-Feb-1946  PCP: Lucille Passy, MD  Subjective:  Pt is a 71 year old female who presents to clinic for f/u UTI. She is here today with her husband. This is her 3rd OV for this problem. She was here 9/20 for altered mental status and cough. Negative chest x-ray. UA showed UTI. Treated with Bactrim. RTC in 9/22 for recheck. The couple reported a fall with prolonged time on the floor the evening of 9/20. UA improving. C/o burning of lower abdomen, worsening cough and congestion. at that time her left TM was bulging and treated with Amoxicillin. A few days later her urine culture showed Klebsiella pneumoniae. Pt was called and asked to d/c bactrim and start Keflex.  She is now taking only Keflex. She reports improvement of symptoms. She has had no more falls or altered mental status episodes. Endorses improvement of lower abdominal pain as well as cough.    Review of Systems  Constitutional: Negative for activity change, chills, diaphoresis, fatigue and fever.  Gastrointestinal: Positive for abdominal pain (lower). Negative for nausea and vomiting.  Genitourinary: Negative for decreased urine volume, difficulty urinating, dysuria, flank pain, frequency, hematuria and urgency.  Psychiatric/Behavioral: Negative for behavioral problems and confusion.    Patient Active Problem List   Diagnosis Date Noted  . Well woman exam 04/18/2017  . IBS (irritable bowel syndrome) 04/11/2013  . Atrial fibrillation ([place]) 01/27/2013  . Cerebrovascular disease 01/27/2013  . TIA (transient ischemic attack) 12/14/2012  . Insomnia 11/02/2012  . GERD 03/12/2010  . Hypothyroidism 04/14/2007  . Depression 04/14/2007  . Essential hypertension 04/14/2007  . Osteoporosis 04/14/2007    Current Outpatient Prescriptions on File Prior to Visit  Medication Sig Dispense Refill  . Alpha-D-Galactosidase (BEANO PO) Take by mouth 2 (two) times daily.    Marland Kitchen amoxicillin (AMOXIL) 500 MG  capsule Take 2 capsules (1,000 mg total) by mouth 3 (three) times daily. 15 capsule 0  . atorvastatin (LIPITOR) 20 MG tablet TAKE 1 TABLET BY MOUTH DAILY (Patient taking differently: TAKE 1 TABLET THREE TIMES BY MOUTH DAILY) 90 tablet 3  . benzonatate (TESSALON) 100 MG capsule Take 1-2 capsules (100-200 mg total) by mouth 3 (three) times daily as needed for cough. 40 capsule 0  . Bepotastine Besilate (BEPREVE) 1.5 % SOLN Use one drop in each eye twice a day    . bumetanide (BUMEX) 1 MG tablet Take 1 tablet (1 mg total) by mouth 2 (two) times daily. 60 tablet 6  . buPROPion (WELLBUTRIN) 75 MG tablet Take 1 tablet (75 mg total) by mouth 2 (two) times daily. 120 tablet 3  . Calcium Carbonate-Vitamin D (CALCIUM 600/VITAMIN D) 600-400 MG-UNIT per tablet Take 1 tablet by mouth 2 (two) times daily.      . cephALEXin (KEFLEX) 500 MG capsule Take 1 capsule (500 mg total) by mouth 2 (two) times daily. 10 capsule 0  . Cholecalciferol (VITAMIN D-3) 5000 UNITS TABS Take one tablet by mouth three times a week    . clonazePAM (KLONOPIN) 0.5 MG tablet Take 1 tablet (0.5 mg total) by mouth at bedtime as needed for anxiety. 30 tablet 1  . cyclobenzaprine (FLEXERIL) 10 MG tablet Take 0.5 to 1 tablet at bedtime as needed for muscle spasm 20 tablet 0  . denosumab (PROLIA) 60 MG/ML SOLN injection Inject 60 mg into the skin once. Administer in upper arm, thigh, or abdomen 180 mL 0  . diclofenac sodium (VOLTAREN) 1 % GEL Apply as  needed 9 Tube 3  . famotidine (PEPCID AC) 10 MG chewable tablet Chew 10 mg by mouth daily.    . fexofenadine (ALLEGRA) 60 MG tablet Take 1 tablet (60 mg total) by mouth 2 (two) times daily. (Patient taking differently: Take 60 mg by mouth 2 (two) times daily. PATIENT TAKES 180MG  AM) 60 tablet 3  . Fluticasone-Salmeterol (ADVAIR DISKUS) 100-50 MCG/DOSE AEPB Inhale 1 puff into the lungs 2 (two) times daily. 180 each 1  . levocetirizine (XYZAL) 5 MG tablet Take 5 mg by mouth daily.  1  .  levothyroxine (SYNTHROID, LEVOTHROID) 50 MCG tablet Take 1 tablet (50 mcg total) by mouth daily. 30 tablet 6  . Melatonin 5 MG TABS Take 3 tablets my mouth at bedtime    . metoprolol succinate (TOPROL-XL) 25 MG 24 hr tablet Take 3 tablets (75 mg total) by mouth daily. 270 tablet 3  . NEXIUM 40 MG capsule Take 1 capsule (40 mg total) by mouth daily. 90 capsule 1  . nystatin (MYCOSTATIN) 100000 UNIT/ML suspension SWISH BY MOUTH 10 ML UP TO 4 TIMES A DAY 240 mL 1  . potassium chloride (K-DUR) 10 MEQ tablet TAKE 1 TABLET BY MOUTH 2 TIMES A DAY. 180 tablet 3  . traZODone (DESYREL) 50 MG tablet TAKE 3 TABLETS BY MOUTH AT BEDTIME. 270 tablet 2  . XARELTO 20 MG TABS tablet TAKE 1 TABLET BY MOUTH DAILY. 90 tablet 3   No current facility-administered medications on file prior to visit.     No Known Allergies   Objective:  BP 132/88   Pulse 72   Temp 99.2 F (37.3 C) (Oral)   Resp 18   Ht 5' 3.5" (1.613 m)   Wt 128 lb (58.1 kg)   SpO2 98%   BMI 22.32 kg/m   Physical Exam  Constitutional: She is oriented to person, place, and time and well-developed, well-nourished, and in no distress. No distress.  Cardiovascular: Normal rate, regular rhythm and normal heart sounds.   Abdominal: There is no tenderness. There is no CVA tenderness.  Neurological: She is alert and oriented to person, place, and time. GCS score is 15.  Skin: Skin is warm and dry.  Psychiatric: Mood, memory, affect and judgment normal.  Improvement in recall and general disposition.   Vitals reviewed.  Results for orders placed or performed in visit on 07/29/17  POCT urinalysis dipstick  Result Value Ref Range   Color, UA yellow yellow   Clarity, UA cloudy (A) clear   Glucose, UA negative negative mg/dL   Bilirubin, UA small (A) negative   Ketones, POC UA negative negative mg/dL   Spec Grav, UA 1.010 1.010 - 1.025   Blood, UA negative negative   pH, UA 6.5 5.0 - 8.0   Protein Ur, POC trace (A) negative mg/dL    Urobilinogen, UA 1.0 0.2 or 1.0 E.U./dL   Nitrite, UA Negative Negative   Leukocytes, UA Small (1+) (A) Negative    Assessment and Plan :  1. Acute cystitis without hematuria - POCT urinalysis dipstick - Urine Culture - Urine Microscopic - Recent h/o altered mental status with falls suspected 2/2 urinary tract infection. She has been followed closely to monitor improvement, and is clinically improving. Pt stopped Bactrim yesterday and started Keflex due to urine culture result. RTC in 5 days for recheck.  2. Cough - Con't amoxicillin for AOM.    Mercer Pod, PA-C  [facility] [place] 07/29/2017 9:50 AM

## 2017-07-29 NOTE — Patient Instructions (Addendum)
Continue taking Keflex.  Start back on Amoxicillin.  Come back and see me in 5 days if needed.   Thank you for coming in today. I hope you feel we met your needs.  Feel free to call PCP if you have any questions or further requests.  Please consider signing up for MyChart if you do not already have it, as this is a great way to communicate with me.  Best,  Whitney McVey, PA-C   IF you received an x-ray today, you will receive an invoice from [facility]. Please contact [facility] at [phone] with questions or concerns regarding your invoice.   IF you received labwork today, you will receive an invoice from [place]. Please contact [facility] at [phone] with questions or concerns regarding your invoice.   Our billing staff will not be able to assist you with questions regarding bills from these companies.  You will be contacted with the lab results as soon as they are available. The fastest way to get your results is to activate your My Chart account. Instructions are located on the last page of this paperwork. If you have not heard from [place] regarding the results in 2 weeks, please contact this office.

## 2017-07-29 NOTE — Progress Notes (Signed)
10-4

## 2017-07-30 LAB — URINALYSIS, MICROSCOPIC ONLY

## 2017-07-30 LAB — URINE CULTURE: Organism ID, Bacteria: NO GROWTH

## 2017-08-04 ENCOUNTER — Ambulatory Visit (INDEPENDENT_AMBULATORY_CARE_PROVIDER_SITE_OTHER): Payer: Medicare Other

## 2017-08-04 DIAGNOSIS — Z23 Encounter for immunization: Secondary | ICD-10-CM

## 2017-08-04 DIAGNOSIS — M810 Age-related osteoporosis without current pathological fracture: Secondary | ICD-10-CM | POA: Diagnosis not present

## 2017-08-04 MED ORDER — DENOSUMAB 60 MG/ML ~~LOC~~ SOLN
60.0000 mg | Freq: Once | SUBCUTANEOUS | Status: AC
  Administered 2017-08-04: 60 mg via SUBCUTANEOUS

## 2017-08-05 ENCOUNTER — Ambulatory Visit (INDEPENDENT_AMBULATORY_CARE_PROVIDER_SITE_OTHER): Payer: Medicare Other | Admitting: Physician Assistant

## 2017-08-05 ENCOUNTER — Encounter: Payer: Self-pay | Admitting: Physician Assistant

## 2017-08-05 VITALS — BP 116/78 | HR 55 | Temp 97.9°F | Resp 16 | Ht 63.0 in | Wt 131.0 lb

## 2017-08-05 DIAGNOSIS — N760 Acute vaginitis: Secondary | ICD-10-CM | POA: Diagnosis not present

## 2017-08-05 DIAGNOSIS — K1379 Other lesions of oral mucosa: Secondary | ICD-10-CM

## 2017-08-05 DIAGNOSIS — M62838 Other muscle spasm: Secondary | ICD-10-CM

## 2017-08-05 DIAGNOSIS — R300 Dysuria: Secondary | ICD-10-CM

## 2017-08-05 LAB — POCT WET + KOH PREP
Trich by wet prep: ABSENT
Yeast by KOH: ABSENT
Yeast by wet prep: ABSENT

## 2017-08-05 LAB — POC MICROSCOPIC URINALYSIS (UMFC): Mucus: ABSENT

## 2017-08-05 LAB — POCT URINALYSIS DIP (MANUAL ENTRY)
Bilirubin, UA: NEGATIVE
Blood, UA: NEGATIVE
Glucose, UA: NEGATIVE mg/dL
Ketones, POC UA: NEGATIVE mg/dL
Leukocytes, UA: NEGATIVE
Nitrite, UA: NEGATIVE
Spec Grav, UA: 1.015 (ref 1.010–1.025)
Urobilinogen, UA: 1 U/dL
pH, UA: 7.5 (ref 5.0–8.0)

## 2017-08-05 MED ORDER — CYCLOBENZAPRINE HCL 5 MG PO TABS: ORAL_TABLET | ORAL | 0 refills | Status: DC

## 2017-08-05 MED ORDER — METRONIDAZOLE 0.75 % VA GEL: 1.0000 | Freq: Every day | VAGINAL | 0 refills | Status: AC

## 2017-08-05 MED ORDER — NYSTATIN 100000 UNIT/ML MT SUSP: OROMUCOSAL | 1 refills | Status: DC

## 2017-08-05 NOTE — Progress Notes (Signed)
Patricia Peck  MRN: [id number] DOB: [birth year]  PCP: Lucille Passy, MD  Subjective:  Pt is a 71 year old female PMH HTN, TIA, atrial fibrillation, IBS, GERD, hypothyroidism, depression and insomnia who presents to clinic for multiple complaints.   Burning with urination. She was here 9/20 for altered mental status and cough. Negative chest x-ray. UA showed UTI. Treated with Bactrim. RTC in 9/22 for recheck. The couple reported a fall with prolonged time on the floor the evening of 9/20. UA improving. C/o burning of lower abdomen, worsening cough and congestion. at that time her left TM was bulging and treated with Amoxicillin. A few days later her urine culture showed Klebsiella pneumoniae. Pt was called and asked to d/c bactrim and start Keflex.   Today she reports she is mostly better but con't to experience burning with urination and vaginal discomfort. Denies discharge, back pain, abdominal pain, AMS, fever, chills.  Back and neck pain - she could not turn her neck earlier this week due to left sided pain. Muscle spasm on left side of mid back.   Tongue pain - she uses a nystatin mouth wash occasionally which helps. She is almost out and would like a refill.   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 She has appt with ear specialist next week.   Review of Systems  Constitutional: Negative for chills, fatigue and fever.  Respiratory: Negative for cough, shortness of breath and wheezing.   Cardiovascular: Negative for chest pain and palpitations.  Gastrointestinal: Negative for abdominal pain, diarrhea, nausea and vomiting.  Genitourinary: Positive for dysuria and vaginal pain. Negative for decreased urine volume, difficulty urinating, enuresis, flank pain, frequency, hematuria and urgency.  Musculoskeletal: Negative for back pain.  Neurological: Negative for dizziness, weakness, light-headedness and headaches.    Patient Active Problem List   Diagnosis Date Noted  . Well woman exam  04/18/2017  . IBS (irritable bowel syndrome) 04/11/2013  . Atrial fibrillation ([place]) 01/27/2013  . Cerebrovascular disease 01/27/2013  . TIA (transient ischemic attack) 12/14/2012  . Insomnia 11/02/2012  . GERD 03/12/2010  . Hypothyroidism 04/14/2007  . Depression 04/14/2007  . Essential hypertension 04/14/2007  . Osteoporosis 04/14/2007    Current Outpatient Prescriptions on File Prior to Visit  Medication Sig Dispense Refill  . Alpha-D-Galactosidase (BEANO PO) Take by mouth 2 (two) times daily.    Marland Kitchen atorvastatin (LIPITOR) 20 MG tablet TAKE 1 TABLET BY MOUTH DAILY (Patient taking differently: TAKE 1 TABLET THREE TIMES BY MOUTH DAILY) 90 tablet 3  . benzonatate (TESSALON) 100 MG capsule Take 1-2 capsules (100-200 mg total) by mouth 3 (three) times daily as needed for cough. 40 capsule 0  . Bepotastine Besilate (BEPREVE) 1.5 % SOLN Use one drop in each eye twice a day    . bumetanide (BUMEX) 1 MG tablet Take 1 tablet (1 mg total) by mouth 2 (two) times daily. 60 tablet 6  . buPROPion (WELLBUTRIN) 75 MG tablet Take 1 tablet (75 mg total) by mouth 2 (two) times daily. 120 tablet 3  . Calcium Carbonate-Vitamin D (CALCIUM 600/VITAMIN D) 600-400 MG-UNIT per tablet Take 1 tablet by mouth 2 (two) times daily.      . Cholecalciferol (VITAMIN D-3) 5000 UNITS TABS Take one tablet by mouth three times a week    . clonazePAM (KLONOPIN) 0.5 MG tablet Take 1 tablet (0.5 mg total) by mouth at bedtime as needed for anxiety. 30 tablet 1  . cyclobenzaprine (FLEXERIL) 10 MG tablet Take 0.5  to 1 tablet at bedtime as needed for muscle spasm 20 tablet 0  . denosumab (PROLIA) 60 MG/ML SOLN injection Inject 60 mg into the skin once. Administer in upper arm, thigh, or abdomen 180 mL 0  . diclofenac sodium (VOLTAREN) 1 % GEL Apply as needed 9 Tube 3  . famotidine (PEPCID AC) 10 MG chewable tablet Chew 10 mg by mouth daily.    . fexofenadine (ALLEGRA) 60 MG tablet Take 1 tablet (60 mg total) by mouth 2 (two) times  daily. (Patient taking differently: Take 60 mg by mouth 2 (two) times daily. PATIENT TAKES 180MG  AM) 60 tablet 3  . Fluticasone-Salmeterol (ADVAIR DISKUS) 100-50 MCG/DOSE AEPB Inhale 1 puff into the lungs 2 (two) times daily. 180 each 1  . levocetirizine (XYZAL) 5 MG tablet Take 5 mg by mouth daily.  1  . levothyroxine (SYNTHROID, LEVOTHROID) 50 MCG tablet Take 1 tablet (50 mcg total) by mouth daily. 30 tablet 6  . Melatonin 5 MG TABS Take 3 tablets my mouth at bedtime    . metoprolol succinate (TOPROL-XL) 25 MG 24 hr tablet Take 3 tablets (75 mg total) by mouth daily. 270 tablet 3  . NEXIUM 40 MG capsule Take 1 capsule (40 mg total) by mouth daily. 90 capsule 1  . nystatin (MYCOSTATIN) 100000 UNIT/ML suspension SWISH BY MOUTH 10 ML UP TO 4 TIMES A DAY 240 mL 1  . potassium chloride (K-DUR) 10 MEQ tablet TAKE 1 TABLET BY MOUTH 2 TIMES A DAY. 180 tablet 3  . traZODone (DESYREL) 50 MG tablet TAKE 3 TABLETS BY MOUTH AT BEDTIME. 270 tablet 2  . XARELTO 20 MG TABS tablet TAKE 1 TABLET BY MOUTH DAILY. 90 tablet 3   No current facility-administered medications on file prior to visit.     No Known Allergies   Objective:  BP 116/78   Pulse (!) 55   Temp 97.9 F (36.6 C) (Oral)   Resp 16   Ht 5\' 3"  (1.6 m)   Wt 131 lb (59.4 kg)   SpO2 97%   BMI 23.21 kg/m   Physical Exam  Constitutional: She is oriented to person, place, and time and well-developed, well-nourished, and in no distress. No distress.  Cardiovascular: Normal rate, regular rhythm and normal heart sounds.   Abdominal: Soft. Normal appearance. There is no tenderness. There is no CVA tenderness.  Genitourinary: No vaginal discharge found.  Genitourinary Comments: Vaginal atrophy.   Musculoskeletal:       Cervical back: She exhibits spasm. She exhibits normal range of motion and no bony tenderness.       Thoracic back: She exhibits spasm (left side). She exhibits normal range of motion and no bony tenderness.  Neurological: She is  alert and oriented to person, place, and time. GCS score is 15.  Skin: Skin is warm and dry.  Psychiatric: Mood, memory, affect and judgment normal.  Vitals reviewed.  Results for orders placed or performed in visit on 08/05/17  POCT Microscopic Urinalysis ([facility])  Result Value Ref Range   WBC,UR,HPF,POC None None WBC/hpf   RBC,UR,HPF,POC None None RBC/hpf   Bacteria None None, Too numerous to count   Mucus Absent Absent   Epithelial Cells, UR Per Microscopy None None, Too numerous to count cells/hpf  POCT urinalysis dipstick  Result Value Ref Range   Color, UA yellow yellow   Clarity, UA clear clear   Glucose, UA negative negative mg/dL   Bilirubin, UA negative negative   Ketones, POC UA negative negative  mg/dL   Spec Grav, UA 1.015 1.010 - 1.025   Blood, UA negative negative   pH, UA 7.5 5.0 - 8.0   Protein Ur, POC trace (A) negative mg/dL   Urobilinogen, UA 1.0 0.2 or 1.0 E.U./dL   Nitrite, UA Negative Negative   Leukocytes, UA Negative Negative  POCT Wet + KOH Prep  Result Value Ref Range   Yeast by KOH Absent Absent   Yeast by wet prep Absent Absent   WBC by wet prep None (A) Few   Clue Cells Wet Prep HPF POC None None   Trich by wet prep Absent Absent   Bacteria Wet Prep HPF POC Many (A) Few   Epithelial Cells By [employer] Pref ([facility]) Few None, Few, Too numerous to count   RBC,UR,HPF,POC None None RBC/hpf    Assessment and Plan :  1. Burning with urination 2. Vaginosis - metroNIDAZOLE (METROGEL VAGINAL) 0.75 % vaginal gel; Place 1 Applicatorful vaginally at bedtime.  Dispense: 70 g; Refill: 0 - Urine Culture - POCT Microscopic Urinalysis ([facility]) - POCT urinalysis dipstick - POCT Wet + KOH Prep - UA is negative for UTI. Suspect her urinary tract infection has completely cleared. Wet prep is negative. Will treat with Metrogel x 5 days. RTC in 7 days if no improvement.   3. Muscle spasm - cyclobenzaprine (FLEXERIL) 5 MG tablet; Take 0.5 to 1 tablet at bedtime as needed  for muscle spasm  Dispense: 14 tablet; Refill: 0  4. Mouth pain - nystatin (MYCOSTATIN) 100000 UNIT/ML suspension; SWISH BY MOUTH 10 ML UP TO 4 TIMES A DAY  Dispense: 240 mL; Refill: 1   Whitney Janete Quilling, PA-C  [facility] 08/05/2017 3:21 PM

## 2017-08-05 NOTE — Patient Instructions (Addendum)
  You are negative for a yeast infection, however there is some bacteria in your vagina. Start Metrogel - use this once at night before bed. Wear underwear to bed, as there may be some discharge during the night. Do this for 5 days.  Come back if you are not better in 7 days. Stay well hydrated.   Thank you for coming in today. I hope you feel we met your needs.  Feel free to call PCP if you have any questions or further requests.  Please consider signing up for MyChart if you do not already have it, as this is a great way to communicate with me.  Best,  Whitney McVey, PA-C   IF you received an x-ray today, you will receive an invoice from [facility]. Please contact [facility] at [phone] with questions or concerns regarding your invoice.   IF you received labwork today, you will receive an invoice from [place]. Please contact [facility] at [phone] with questions or concerns regarding your invoice.   Our billing staff will not be able to assist you with questions regarding bills from these companies.  You will be contacted with the lab results as soon as they are available. The fastest way to get your results is to activate your My Chart account. Instructions are located on the last page of this paperwork. If you have not heard from [place] regarding the results in 2 weeks, please contact this office.

## 2017-08-06 ENCOUNTER — Telehealth: Payer: Self-pay

## 2017-08-06 NOTE — Telephone Encounter (Signed)
[place], Started PA for cyclobenzaprine through Cover My Meds.  It is a non formulary drug per patient's insurance company even though pt has taken successfully before. The formulary medications are as follows: anaprox, naprosyn,naprelan, celebrex, nalfon capsules, profeno, Ibuprofen, ketoprofen and zanaflex.  If you so choose, you can try a formulary and it will be approved today, or you can wait 72 business hours for approval or denial of cyclobenzaprine.   Please advise. Thanks, [employer]

## 2017-08-07 ENCOUNTER — Other Ambulatory Visit: Payer: Self-pay | Admitting: Physician Assistant

## 2017-08-07 DIAGNOSIS — M62830 Muscle spasm of back: Secondary | ICD-10-CM

## 2017-08-07 MED ORDER — TIZANIDINE HCL 2 MG PO CAPS: 2.0000 mg | ORAL_CAPSULE | Freq: Three times a day (TID) | ORAL | 0 refills | Status: DC

## 2017-08-07 NOTE — Telephone Encounter (Signed)
Rx for zanaflex sent to pharmacy. Thank you!

## 2017-08-07 NOTE — Telephone Encounter (Signed)
Whitney, Our request for cyclobenzaprine was denied.  Patient need 3 tried and failed formularies before approved.  Please choose a medication from the formulary list I sent you yesterday.   Thank you, Nuchem Grattan

## 2017-08-08 LAB — URINE CULTURE

## 2017-08-27 ENCOUNTER — Other Ambulatory Visit: Payer: Self-pay | Admitting: Physician Assistant

## 2017-08-27 DIAGNOSIS — N3000 Acute cystitis without hematuria: Secondary | ICD-10-CM

## 2017-08-27 NOTE — Progress Notes (Signed)
Please call pt and have her come in for LAB ONLY visit. She needs to provide a urine sample to make sure her UTI is gone. Thank you!

## 2017-08-28 ENCOUNTER — Ambulatory Visit: Payer: Medicare Other | Admitting: Urgent Care

## 2017-08-28 DIAGNOSIS — N3000 Acute cystitis without hematuria: Secondary | ICD-10-CM

## 2017-08-30 LAB — URINE CULTURE

## 2017-09-02 ENCOUNTER — Other Ambulatory Visit: Payer: Self-pay | Admitting: Physician Assistant

## 2017-09-02 DIAGNOSIS — N3001 Acute cystitis with hematuria: Secondary | ICD-10-CM

## 2017-09-02 MED ORDER — AMOXICILLIN-POT CLAVULANATE 500-125 MG PO TABS: 1.0000 | ORAL_TABLET | Freq: Two times a day (BID) | ORAL | 0 refills | Status: AC

## 2017-09-02 NOTE — Progress Notes (Signed)
Please call pt and let her know her urine culture grew bacteria. I sent an antibiotic to her pharmacy. Please pick it up and start taking it. Come back to the clinic in 7-10 days for another LAB ONLY visit to provide a urine sample. I want to make sure the infection is clear. Thank you!

## 2017-09-03 ENCOUNTER — Ambulatory Visit: Payer: Self-pay | Admitting: *Deleted

## 2017-09-03 ENCOUNTER — Telehealth: Payer: Self-pay | Admitting: Family Medicine

## 2017-09-03 NOTE — Telephone Encounter (Signed)
Pt called asking about her new prescriptions for her urinary tract infection. She had been on a different one but now did not know whether to start this one.  Pt advised to start taking the new one. This patient has an appointment for Monday with her provider. She voiced understanding that she would.

## 2017-09-03 NOTE — Telephone Encounter (Signed)
Attempted  To  Call  Patient  About  Her   Medication  Concerns    Left  Message  To  Call  Back

## 2017-09-03 NOTE — Telephone Encounter (Signed)
Copied from [place] [id number]. Topic: Inquiry >> Sep 03, 2017  9:29 AM Darl Householder, RMA wrote: Reason for CRM: patient has been prescribed medication for an infection and has been taking different medications and now this antibiotic was prescribed and she would like to know if she needs to take it or just wait until she sees Dr. Deborra Medina on Monday, pt would like to speak with a nurse today if possible

## 2017-09-07 ENCOUNTER — Ambulatory Visit (INDEPENDENT_AMBULATORY_CARE_PROVIDER_SITE_OTHER): Payer: Medicare Other | Admitting: Family Medicine

## 2017-09-07 ENCOUNTER — Encounter: Payer: Self-pay | Admitting: Family Medicine

## 2017-09-07 VITALS — BP 136/58 | HR 63 | Temp 97.9°F | Ht 63.0 in | Wt 132.8 lb

## 2017-09-07 DIAGNOSIS — R81 Glycosuria: Secondary | ICD-10-CM

## 2017-09-07 DIAGNOSIS — N390 Urinary tract infection, site not specified: Secondary | ICD-10-CM | POA: Insufficient documentation

## 2017-09-07 LAB — POCT URINALYSIS DIPSTICK
GLUCOSE UA: 100
KETONES UA: NEGATIVE
Leukocytes, UA: NEGATIVE
Nitrite, UA: NEGATIVE
Protein, UA: 15
RBC UA: NEGATIVE
UROBILINOGEN UA: 0.2 U/dL
pH, UA: 6 (ref 5.0–8.0)

## 2017-09-07 NOTE — Progress Notes (Signed)
Subjective:   Patient ID: Patricia Peck, female    DOB: 17-Mar-1946, 71 y.o.   MRN: [id number]  Patricia Peck is a pleasant 71 y.o. year old female who presents to clinic today with Dysuria (Patient is here today C/O burning while urinating.  Has been on 4 different abx and Sx still persist.  Currently taking Augmentin 500-125mg  bid started on 10.31.2018.)  on 09/07/2017  HPI:  Was seen at [facility] for UTI symptoms.  Called on 08/28/17 by Jaynee Eagles at [facility] stating that urine cx grew bacteria and advised to take abx- Augmentin. Urine cx reviewed- > 100,000 Klebsiella, sensitive to Augmentin but resistant to several other abx including ampicillin, nitrofurantoin, bactrim.  This was her fourth abx to treat this.  Dysuria is better but still having a little dysuria.  Current Outpatient Medications on File Prior to Visit  Medication Sig Dispense Refill  . Alpha-D-Galactosidase (BEANO PO) Take by mouth 2 (two) times daily.    Marland Kitchen amoxicillin-clavulanate (AUGMENTIN) 500-125 MG tablet Take 1 tablet (500 mg total) by mouth 2 (two) times daily. Take with food or milk 14 tablet 0  . atorvastatin (LIPITOR) 20 MG tablet TAKE 1 TABLET BY MOUTH DAILY (Patient taking differently: TAKE 1 TABLET THREE TIMES BY MOUTH DAILY) 90 tablet 3  . Bepotastine Besilate (BEPREVE) 1.5 % SOLN Use one drop in each eye twice a day    . bumetanide (BUMEX) 1 MG tablet Take 1 tablet (1 mg total) by mouth 2 (two) times daily. 60 tablet 6  . buPROPion (WELLBUTRIN) 75 MG tablet Take 1 tablet (75 mg total) by mouth 2 (two) times daily. (Patient taking differently: Take 75 mg daily by mouth. ) 120 tablet 3  . Calcium Carbonate-Vitamin D (CALCIUM 600/VITAMIN D) 600-400 MG-UNIT per tablet Take 1 tablet by mouth 2 (two) times daily.      . Cholecalciferol (VITAMIN D-3) 5000 UNITS TABS Take one tablet by mouth three times a week    . denosumab (PROLIA) 60 MG/ML SOLN injection Inject 60 mg into the skin once. Administer in upper  arm, thigh, or abdomen 180 mL 0  . diclofenac sodium (VOLTAREN) 1 % GEL Apply as needed 9 Tube 3  . famotidine (PEPCID AC) 10 MG chewable tablet Chew 10 mg by mouth daily.    . fexofenadine (ALLEGRA) 60 MG tablet Take 1 tablet (60 mg total) by mouth 2 (two) times daily. (Patient taking differently: Take 60 mg by mouth 2 (two) times daily. PATIENT TAKES 180MG  AM) 60 tablet 3  . Fluticasone-Salmeterol (ADVAIR DISKUS) 100-50 MCG/DOSE AEPB Inhale 1 puff into the lungs 2 (two) times daily. 180 each 1  . levocetirizine (XYZAL) 5 MG tablet Take 5 mg by mouth daily.  1  . levothyroxine (SYNTHROID, LEVOTHROID) 50 MCG tablet Take 1 tablet (50 mcg total) by mouth daily. 30 tablet 6  . Melatonin 5 MG TABS Take 3 tablets my mouth at bedtime    . metoprolol succinate (TOPROL-XL) 25 MG 24 hr tablet Take 3 tablets (75 mg total) by mouth daily. 270 tablet 3  . NEXIUM 40 MG capsule Take 1 capsule (40 mg total) by mouth daily. 90 capsule 1  . potassium chloride (K-DUR) 10 MEQ tablet TAKE 1 TABLET BY MOUTH 2 TIMES A DAY. 180 tablet 3  . traZODone (DESYREL) 50 MG tablet TAKE 3 TABLETS BY MOUTH AT BEDTIME. 270 tablet 2  . XARELTO 20 MG TABS tablet TAKE 1 TABLET BY MOUTH DAILY. 90 tablet 3  .  clonazePAM (KLONOPIN) 0.5 MG tablet Take 1 tablet (0.5 mg total) by mouth at bedtime as needed for anxiety. (Patient not taking: Reported on 09/07/2017) 30 tablet 1  . cyclobenzaprine (FLEXERIL) 5 MG tablet Take 0.5 to 1 tablet at bedtime as needed for muscle spasm (Patient not taking: Reported on 09/07/2017) 14 tablet 0  . nystatin (MYCOSTATIN) 100000 UNIT/ML suspension SWISH BY MOUTH 10 ML UP TO 4 TIMES A DAY (Patient not taking: Reported on 09/07/2017) 240 mL 1  . tizanidine (ZANAFLEX) 2 MG capsule Take 1-2 capsules (2-4 mg total) by mouth 3 (three) times daily. (Patient not taking: Reported on 09/07/2017) 30 capsule 0   No current facility-administered medications on file prior to visit.     No Known Allergies  Past Medical  History:  Diagnosis Date  . Allergy   . Arthritis    hands  . Asthma   . Atrial fibrillation ([place])   . Atrial flutter ([place])   . Depression   . Dysrhythmia    a-fib, on Xarelto  . GERD (gastroesophageal reflux disease)   . Hyperlipidemia   . Hypertension   . Hypothyroidism   . Osteoporosis   . TIA (transient ischemic attack)     Past Surgical History:  Procedure Laterality Date  . ABDOMINAL HYSTERECTOMY    . COLONOSCOPY    . PARTIAL HYSTERECTOMY  1989    Family History  Problem Relation Age of Onset  . Breast cancer Mother   . Drug abuse Son   . Depression Son   . Stroke Paternal Aunt   . Heart disease Paternal Grandfather   . Diabetes Maternal Grandfather     Social History   Socioeconomic History  . Marital status: Married    Spouse name: Not on file  . Number of children: 2  . Years of education: Not on file  . Highest education level: Not on file  Social Needs  . Financial resource strain: Not on file  . Food insecurity - worry: Not on file  . Food insecurity - inability: Not on file  . Transportation needs - medical: Not on file  . Transportation needs - non-medical: Not on file  Occupational History    Employer: RETIRED  Tobacco Use  . Smoking status: Never Smoker  . Smokeless tobacco: Never Used  Substance and Sexual Activity  . Alcohol use: Yes    Comment: Rare  . Drug use: No  . Sexual activity: Not on file  Other Topics Concern  . Not on file  Social History Narrative   Does have HPOA- sons Eddie Dibbles and Wille Glaser.   Does desire life support if not futile.   Not sure about feeding tubes.   The PMH, PSH, Social History, Family History, Medications, and allergies have been reviewed in [facility], and have been updated if relevant.      Review of Systems  Endocrine: Positive for polydipsia and polyuria.  Genitourinary: Positive for dysuria. Negative for decreased urine volume, difficulty urinating, dyspareunia, enuresis, flank pain, frequency, genital  sores, hematuria, menstrual problem, pelvic pain, urgency, vaginal bleeding, vaginal discharge and vaginal pain.       Objective:    BP (!) 136/58 (BP Location: Right Arm, Patient Position: Sitting, Cuff Size: Normal)   Pulse 63   Temp 97.9 F (36.6 C) (Oral)   Ht 5\' 3"  (1.6 m)   Wt 132 lb 12.8 oz (60.2 kg)   SpO2 98%   BMI 23.52 kg/m    Physical Exam   General:  Well-developed,well-nourished,in no acute distress; alert,appropriate and cooperative throughout examination Head:  normocephalic and atraumatic.   Eyes:  vision grossly intact, PERRL Ears:  R ear normal and L ear normal externally, TMs clear bilaterally Nose:  no external deformity.   Mouth:  good dentition.   Neck:  No deformities, masses, or tenderness noted. Abdomen:  Bowel sounds positive,abdomen soft and non-tender without masses, organomegaly or hernias noted. Msk:  No deformity or scoliosis noted of thoracic or lumbar spine.   Extremities:  No clubbing, cyanosis, edema, or deformity noted with normal full range of motion of all joints.   Neurologic:  alert & oriented X3 and gait normal.   Skin:  Intact without suspicious lesions or rashes Psych:  Cognition and judgment appear intact. Alert and cooperative with normal attention span and concentration. No apparent delusions, illusions, hallucinations       Assessment & Plan:   Glucosuria - Plan: Hemoglobin A1c  Urinary tract infection without hematuria, site unspecified - Plan: POCT urinalysis dipstick No Follow-up on file.

## 2017-09-07 NOTE — Addendum Note (Signed)
Addended by: Ellamae Sia on: 09/07/2017 04:22 PM   Modules accepted: Orders

## 2017-09-07 NOTE — Assessment & Plan Note (Signed)
UA neg for bacteria today which is reassuring from a UTI standpoint- finish course of Augmentin, send urine for cx. She does however have glucosuria- a1c today. The patient indicates understanding of these issues and agrees with the plan.

## 2017-09-07 NOTE — Progress Notes (Signed)
Patient has appointment with Primary Care 09/07/2017

## 2017-09-08 LAB — HEMOGLOBIN A1C: HEMOGLOBIN A1C: 5.7 % (ref 4.6–6.5)

## 2017-09-09 LAB — URINE CULTURE
MICRO NUMBER: 81239976
RESULT: NO GROWTH
SPECIMEN QUALITY: ADEQUATE

## 2017-10-15 ENCOUNTER — Telehealth: Payer: Self-pay | Admitting: *Deleted

## 2017-10-15 NOTE — Telephone Encounter (Signed)
   [facility] Pre-operative Risk Assessment    Request for surgical clearance:  1. What type of surgery is being performed? RIGHT EAR M.G. T-PLASTY WITH C.G.   2. When is this surgery scheduled? 10/20/17   3. Are there any medications that need to be held prior to surgery and how long?XARELTO 3 DAYS   4. Practice name and name of physician performing surgery? [facility]    5. What is your office phone and fax number? [place] [phone] FAX [phone]    6. Anesthesia type (None, local, MAC, general) ? UNKNOWN

## 2017-10-16 NOTE — Telephone Encounter (Signed)
Pt takes Xarelto for afib with CHADS2VASc score of 6 (age, sex, HTN, TIA in 2014, CAD). SCr is 0.94 and CrCl is 47mL/min. Recommend holding Xarelto for 2 days due mildly reduced CrCl but not for 3 days due to hx of TIA.

## 2017-10-16 NOTE — Telephone Encounter (Signed)
F/u message  Spring from [place] to f/u on clearance

## 2017-10-16 NOTE — Telephone Encounter (Signed)
Returned [place] call and let her know that the clearance has been sent over to our pharmacist for direction on the Xarelto. Spring thanked me for the call.

## 2017-10-16 NOTE — Telephone Encounter (Signed)
   Primary Cardiologist: No primary care provider on file.  Chart reviewed as part of pre-operative protocol coverage. Given past medical history and time since last visit, based on ACC/AHA guidelines, ZUZANNA MARONEY would be at acceptable risk for the planned procedure without further cardiovascular testing.    With her History of TIA, we recommend only 2 days off Xarelto prior to procedure.    I will route this recommendation to the requesting party via Epic fax function and remove from pre-op pool.  Please call with questions.  Cecilie Kicks, NP 10/16/2017, 3:53 PM

## 2017-10-19 ENCOUNTER — Telehealth: Payer: Self-pay | Admitting: Cardiology

## 2017-10-19 ENCOUNTER — Telehealth: Payer: Self-pay

## 2017-10-19 NOTE — Telephone Encounter (Signed)
   [facility] Pre-operative Risk Assessment    Request for surgical clearance:  1. What type of surgery is being performed-right ear transcanal and tympanoplasty with cartilage graft.  2. When is this surgery scheduled-unknown  3. Are there any medications that need to be held prior to surgery and how long-Xarelto  4. Practice name and name of physician performing [facility]  5. What is your office phone # [phone]   Fax # [phone]  6. Anesthesia type-General   Kathyrn Lass 10/19/2017, 8:36 AM  _________________________________________________________________   (provider comments below)

## 2017-10-19 NOTE — Telephone Encounter (Addendum)
Clearance note re-faxed to the number provided. Spoke with the office, they received the clearance but they would like the original form signed and sent back to them. Aware that is not our policy and we do not keep the forms. She will make the doctor aware.

## 2017-10-19 NOTE — Telephone Encounter (Signed)
°  Follow Up   Calling to follow up on clearance fax. NP states she spoke to someone a few minutes ago but has not yet received fax. Providing additional fax number: [phone]. Please call once fax has been sent over.

## 2017-10-19 NOTE — Telephone Encounter (Signed)
See clearance done 10/15/17 and faxed 10/16/17 & 10/19/17.

## 2017-10-19 NOTE — Telephone Encounter (Signed)
Re-faxed cardiac clearance for [facility] [place].  Will delete 2nd request for clearance since already been done.

## 2017-10-21 ENCOUNTER — Other Ambulatory Visit: Payer: Self-pay | Admitting: Family Medicine

## 2017-10-21 DIAGNOSIS — S239XXA Sprain of unspecified parts of thorax, initial encounter: Secondary | ICD-10-CM

## 2017-12-11 ENCOUNTER — Telehealth: Payer: Self-pay | Admitting: Family Medicine

## 2017-12-11 NOTE — Telephone Encounter (Signed)
Spoke with patient regarding AWV. Patricia Peck also has questions about her prolia injection and would like for someone to give her a call. SF

## 2017-12-11 NOTE — Telephone Encounter (Signed)
RB-Do you have information concerning this patient's Prolia? Plz advise/thx dmf

## 2017-12-15 ENCOUNTER — Other Ambulatory Visit: Payer: Self-pay

## 2017-12-15 NOTE — Telephone Encounter (Signed)
Informed patient that due date for next Prolia injection is 02/02/18; will call closer to this date to schedule appointment for injection, once insurance benefits have been received. Patient verbalized understanding & did not have any further questions or concerns before call ended.

## 2017-12-16 ENCOUNTER — Encounter: Payer: Self-pay | Admitting: Family Medicine

## 2017-12-16 ENCOUNTER — Ambulatory Visit (INDEPENDENT_AMBULATORY_CARE_PROVIDER_SITE_OTHER): Payer: Medicare Other | Admitting: Family Medicine

## 2017-12-16 VITALS — BP 114/60 | HR 48 | Temp 97.8°F | Ht 63.0 in | Wt 132.6 lb

## 2017-12-16 DIAGNOSIS — F32A Depression, unspecified: Secondary | ICD-10-CM

## 2017-12-16 DIAGNOSIS — F329 Major depressive disorder, single episode, unspecified: Secondary | ICD-10-CM

## 2017-12-16 MED ORDER — BUPROPION HCL ER (XL) 150 MG PO TB24: 150.0000 mg | ORAL_TABLET | Freq: Every day | ORAL | 3 refills | Status: DC

## 2017-12-16 NOTE — Patient Instructions (Signed)
Great to see you.  Please stop taking your current dose of Wellbutrin.  Start taking Wellbutrin 150 mg XL in the morning.  Please keep me updated.

## 2017-12-16 NOTE — Progress Notes (Signed)
Subjective:   Patient ID: Patricia Peck, female    DOB: 1946-01-20, 72 y.o.   MRN: [id number]  Patricia Peck is a pleasant 72 y.o. year old female who presents to clinic today with Depression (Patient is here today to discuss possible change in depression medication.  She currently takes Bupropion 75mg  1qd (if she takes it bid she starts to shake) and she says that she doesn't think that it is doing her any good for about 3 months now.  Only sleeps 2-3 hours at night and sleeps a lot during the day.  Family has been through a lot since October and feels that she is stressed all the time. She has a list of stressors that we went over.)  on 12/16/2017  HPI:  Depression- was supposed to be taking Wellbutrin 75 mg twice daily but has only been taking 75 mg daily.  And feels for past 3 months this dose is no longer effective. Only sleeping 2-3 hours at night and sleeping a lot during the day.  Several stressors at home.  PHQ 9 score of 19 today.  Current Outpatient Medications on File Prior to Visit  Medication Sig Dispense Refill  . Alpha-D-Galactosidase (BEANO PO) Take by mouth 2 (two) times daily.    Marland Kitchen atorvastatin (LIPITOR) 20 MG tablet TAKE 1 TABLET BY MOUTH DAILY (Patient taking differently: TAKE 1 TABLET THREE TIMES BY MOUTH DAILY) 90 tablet 3  . Bepotastine Besilate (BEPREVE) 1.5 % SOLN Use one drop in each eye twice a day    . bumetanide (BUMEX) 1 MG tablet TAKE 1 TABLET BY MOUTH 2 TIMES DAILY. 180 tablet 1  . Calcium Carbonate-Vitamin D (CALCIUM 600/VITAMIN D) 600-400 MG-UNIT per tablet Take 1 tablet by mouth 2 (two) times daily.      . Cholecalciferol (VITAMIN D-3) 5000 UNITS TABS Take one tablet by mouth three times a week    . cyclobenzaprine (FLEXERIL) 5 MG tablet Take 0.5 to 1 tablet at bedtime as needed for muscle spasm 14 tablet 0  . denosumab (PROLIA) 60 MG/ML SOLN injection Inject 60 mg into the skin once. Administer in upper arm, thigh, or abdomen 180 mL 0  . diclofenac  sodium (VOLTAREN) 1 % GEL Apply as needed 9 Tube 3  . famotidine (PEPCID AC) 10 MG chewable tablet Chew 10 mg by mouth daily.    . fexofenadine (ALLEGRA) 60 MG tablet Take 1 tablet (60 mg total) by mouth 2 (two) times daily. (Patient taking differently: Take 60 mg by mouth 2 (two) times daily. PATIENT TAKES 180MG  AM) 60 tablet 3  . Fluticasone-Salmeterol (ADVAIR DISKUS) 100-50 MCG/DOSE AEPB Inhale 1 puff into the lungs 2 (two) times daily. 180 each 1  . levocetirizine (XYZAL) 5 MG tablet Take 5 mg by mouth daily.  1  . levothyroxine (SYNTHROID, LEVOTHROID) 50 MCG tablet TAKE 1 TABLET BY MOUTH DAILY. 90 tablet 1  . Melatonin 5 MG TABS Take 2 tablets my mouth at bedtime    . metoprolol succinate (TOPROL-XL) 25 MG 24 hr tablet Take 3 tablets (75 mg total) by mouth daily. 270 tablet 3  . NEXIUM 40 MG capsule Take 1 capsule (40 mg total) by mouth daily. 90 capsule 1  . nystatin (MYCOSTATIN) 100000 UNIT/ML suspension SWISH BY MOUTH 10 ML UP TO 4 TIMES A DAY 240 mL 1  . potassium chloride (K-DUR) 10 MEQ tablet TAKE 1 TABLET BY MOUTH 2 TIMES A DAY. 180 tablet 3  . traZODone (DESYREL) 50 MG tablet  TAKE 3 TABLETS BY MOUTH AT BEDTIME. 270 tablet 2  . XARELTO 20 MG TABS tablet TAKE 1 TABLET BY MOUTH DAILY. 90 tablet 3  . clonazePAM (KLONOPIN) 0.5 MG tablet Take 1 tablet (0.5 mg total) by mouth at bedtime as needed for anxiety. (Patient not taking: Reported on 09/07/2017) 30 tablet 1   No current facility-administered medications on file prior to visit.     No Known Allergies  Past Medical History:  Diagnosis Date  . Allergy   . Arthritis    hands  . Asthma   . Atrial fibrillation ([place])   . Atrial flutter ([place])   . Depression   . Dysrhythmia    a-fib, on Xarelto  . GERD (gastroesophageal reflux disease)   . Hyperlipidemia   . Hypertension   . Hypothyroidism   . Osteoporosis   . TIA (transient ischemic attack)     Past Surgical History:  Procedure Laterality Date  . ABDOMINAL HYSTERECTOMY     . COLONOSCOPY    . MYRINGOTOMY WITH TUBE PLACEMENT Right 01/14/2016   Procedure: RIGHT MYRINGOTOMY WITH T-TUBE PLACEMENT;  Surgeon: Leta Baptist, MD;  Location: [place];  Service: ENT;  Laterality: Right;  . PARTIAL HYSTERECTOMY  1989  . TEE WITHOUT CARDIOVERSION N/A 12/22/2012   Procedure: TRANSESOPHAGEAL ECHOCARDIOGRAM (TEE);  Surgeon: Lelon Perla, MD;  Location: [facility] ENDOSCOPY;  Service: Cardiovascular;  Laterality: N/A;    Family History  Problem Relation Age of Onset  . Breast cancer Mother   . Drug abuse Son   . Depression Son   . Stroke Paternal Aunt   . Heart disease Paternal Grandfather   . Diabetes Maternal Grandfather     Social History   Socioeconomic History  . Marital status: Married    Spouse name: Not on file  . Number of children: 2  . Years of education: Not on file  . Highest education level: Not on file  Social Needs  . Financial resource strain: Not on file  . Food insecurity - worry: Not on file  . Food insecurity - inability: Not on file  . Transportation needs - medical: Not on file  . Transportation needs - non-medical: Not on file  Occupational History    Employer: RETIRED  Tobacco Use  . Smoking status: Never Smoker  . Smokeless tobacco: Never Used  Substance and Sexual Activity  . Alcohol use: Yes    Comment: Rare  . Drug use: No  . Sexual activity: Not on file  Other Topics Concern  . Not on file  Social History Narrative   Does have HPOA- sons Eddie Dibbles and Wille Glaser.   Does desire life support if not futile.   Not sure about feeding tubes.   The PMH, PSH, Social History, Family History, Medications, and allergies have been reviewed in [facility], and have been updated if relevant.   Review of Systems  Psychiatric/Behavioral: Positive for decreased concentration, dysphoric mood and sleep disturbance. Negative for agitation, behavioral problems, confusion, hallucinations, self-injury and suicidal ideas. The patient is nervous/anxious.  The patient is not hyperactive.   All other systems reviewed and are negative.      Objective:    BP 114/60 (BP Location: Right Arm, Patient Position: Sitting, Cuff Size: Normal)   Pulse (!) 48   Temp 97.8 F (36.6 C) (Oral)   Ht 5\' 3"  (1.6 m)   Wt 132 lb 9.6 oz (60.1 kg)   SpO2 97%   BMI 23.49 kg/m    Physical Exam  Constitutional: She is oriented to person, place, and time. She appears well-developed and well-nourished. No distress.  HENT:  Head: Normocephalic and atraumatic.  Eyes: Conjunctivae are normal.  Cardiovascular: Normal rate.  Pulmonary/Chest: Effort normal.  Musculoskeletal: Normal range of motion.  Neurological: She is alert and oriented to person, place, and time. No cranial nerve deficit.  Skin: Skin is dry. She is not diaphoretic.  Psychiatric: She has a normal mood and affect. Her behavior is normal. Judgment and thought content normal.  Nursing note and vitals reviewed.         Assessment & Plan:   Depression, unspecified depression type No Follow-up on file.

## 2017-12-16 NOTE — Assessment & Plan Note (Signed)
Deteriorated. >25 minutes spent in face to face time with patient, >50% spent in counselling or coordination of care Increase wellbutrin to 150 mg XL daily. Call or return to clinic prn if these symptoms worsen or fail to improve as anticipated. The patient indicates understanding of these issues and agrees with the plan.

## 2018-02-01 ENCOUNTER — Other Ambulatory Visit: Payer: Self-pay

## 2018-02-01 ENCOUNTER — Telehealth: Payer: Self-pay

## 2018-02-01 ENCOUNTER — Ambulatory Visit: Payer: Self-pay | Admitting: *Deleted

## 2018-02-01 DIAGNOSIS — M810 Age-related osteoporosis without current pathological fracture: Secondary | ICD-10-CM

## 2018-02-01 DIAGNOSIS — I10 Essential (primary) hypertension: Secondary | ICD-10-CM

## 2018-02-01 NOTE — Telephone Encounter (Signed)
Thank you :)

## 2018-02-01 NOTE — Telephone Encounter (Signed)
TA-Patient has a 30 min OV on Wed/in order for her to get her Prolia injection we have to have an updated Calcium level/I went ahead and future ordered a CMP with Osteoporosis and HTN and scheduled her to get that drawn in the morning at [facility] lab in preparation for that visit/Plz advise if I need to change anything/thx dmf

## 2018-02-01 NOTE — Telephone Encounter (Signed)
Pt   Not  Available till    Wednesday   To   See  The  Provider    - appt  Made  For   Wednesday  With Dr   Scheryl Darter   For  [address]  [place]  Appointment per   Idelle Crouch    Pt  Has  No  Pain   Seems  In no  Distress   Has  Had  The  Symptoms  For  1  Month     Reason for Disposition . [1] MODERATE leg swelling (e.g., swelling extends up to knees) AND [2] new onset or worsening  Answer Assessment - Initial Assessment Questions 1. ONSET: "When did the swelling start?" (e.g., minutes, hours, days)      1  Month  2. LOCATION: "What part of the leg is swollen?"  "Are both legs swollen or just one leg?"     l  Leg   From knee  Down  3. SEVERITY: "How bad is the swelling?" (e.g., localized; mild, moderate, severe)  - Localized - small area of swelling localized to one leg  - MILD pedal edema - swelling limited to foot and ankle, pitting edema < 1/4 inch (6 mm) deep, rest and elevation eliminate most or all swelling  - MODERATE edema - swelling of lower leg to knee, pitting edema > 1/4 inch (6 mm) deep, rest and elevation only partially reduce swelling  - SEVERE edema - swelling extends above knee, facial or hand swelling present      mod 4. REDNESS: "Does the swelling look red or infected?"     No 5. PAIN: "Is the swelling painful to touch?" If so, ask: "How painful is it?"   (Scale 1-10; mild, moderate or severe)     NO 6. FEVER: "Do you have a fever?" If so, ask: "What is it, how was it measured, and when did it start?"      NO 7. CAUSE: "What do you think is causing the leg swelling?"      NO  8. MEDICAL HISTORY: "Do you have a history of heart failure, kidney disease, liver failure, or cancer?"      TIA  9. RECURRENT SYMPTOM: "Have you had leg swelling before?" If so, ask: "When was the last time?" "What happened that time?"       NO 10. OTHER SYMPTOMS: "Do you have any other symptoms?" (e.g., chest pain, difficulty breathing)       NO 11. PREGNANCY: "Is there any chance you are pregnant?" "When  was your last menstrual period?"       N/A  Protocols used: LEG SWELLING AND EDEMA-A-AH

## 2018-02-02 ENCOUNTER — Other Ambulatory Visit: Payer: Medicare Other

## 2018-02-02 ENCOUNTER — Other Ambulatory Visit (INDEPENDENT_AMBULATORY_CARE_PROVIDER_SITE_OTHER): Payer: Medicare Other

## 2018-02-02 DIAGNOSIS — I10 Essential (primary) hypertension: Secondary | ICD-10-CM | POA: Diagnosis not present

## 2018-02-02 DIAGNOSIS — M810 Age-related osteoporosis without current pathological fracture: Secondary | ICD-10-CM | POA: Diagnosis not present

## 2018-02-02 LAB — COMPREHENSIVE METABOLIC PANEL
ALT: 15 U/L (ref 0–35)
AST: 15 U/L (ref 0–37)
Albumin: 4.2 g/dL (ref 3.5–5.2)
Alkaline Phosphatase: 57 U/L (ref 39–117)
BILIRUBIN TOTAL: 0.6 mg/dL (ref 0.2–1.2)
BUN: 16 mg/dL (ref 6–23)
CALCIUM: 9.5 mg/dL (ref 8.4–10.5)
CO2: 27 meq/L (ref 19–32)
Chloride: 103 mEq/L (ref 96–112)
Creatinine, Ser: 0.95 mg/dL (ref 0.40–1.20)
GFR: 61.35 mL/min (ref >60.00)
Glucose, Bld: 116 mg/dL — ABNORMAL HIGH (ref 70–99)
POTASSIUM: 3.5 meq/L (ref 3.5–5.1)
Sodium: 139 mEq/L (ref 135–145)
Total Protein: 7.1 g/dL (ref 6.0–8.3)

## 2018-02-03 ENCOUNTER — Other Ambulatory Visit: Payer: Self-pay

## 2018-02-03 ENCOUNTER — Telehealth: Payer: Self-pay | Admitting: Family Medicine

## 2018-02-03 ENCOUNTER — Ambulatory Visit (INDEPENDENT_AMBULATORY_CARE_PROVIDER_SITE_OTHER): Payer: Medicare Other | Admitting: Family Medicine

## 2018-02-03 ENCOUNTER — Encounter: Payer: Self-pay | Admitting: Family Medicine

## 2018-02-03 VITALS — BP 132/70 | HR 143 | Temp 97.3°F | Ht 63.0 in | Wt 131.8 lb

## 2018-02-03 DIAGNOSIS — M810 Age-related osteoporosis without current pathological fracture: Secondary | ICD-10-CM | POA: Diagnosis not present

## 2018-02-03 DIAGNOSIS — R600 Localized edema: Secondary | ICD-10-CM | POA: Diagnosis not present

## 2018-02-03 DIAGNOSIS — M62838 Other muscle spasm: Secondary | ICD-10-CM

## 2018-02-03 MED ORDER — FAMOTIDINE 10 MG PO CHEW: 10.0000 mg | CHEWABLE_TABLET | Freq: Every day | ORAL | Status: DC

## 2018-02-03 MED ORDER — LEVOTHYROXINE SODIUM 50 MCG PO TABS: 50.0000 ug | ORAL_TABLET | Freq: Every day | ORAL | 1 refills | Status: DC

## 2018-02-03 MED ORDER — POTASSIUM CHLORIDE ER 10 MEQ PO TBCR: 10.0000 meq | EXTENDED_RELEASE_TABLET | Freq: Two times a day (BID) | ORAL | 3 refills | Status: DC

## 2018-02-03 MED ORDER — FLUTICASONE-SALMETEROL 100-50 MCG/DOSE IN AEPB: 1.0000 | INHALATION_SPRAY | Freq: Two times a day (BID) | RESPIRATORY_TRACT | 1 refills | Status: DC

## 2018-02-03 MED ORDER — BUPROPION HCL ER (XL) 150 MG PO TB24: 150.0000 mg | ORAL_TABLET | Freq: Every day | ORAL | 1 refills | Status: DC

## 2018-02-03 MED ORDER — DENOSUMAB 60 MG/ML ~~LOC~~ SOLN
60.0000 mg | Freq: Once | SUBCUTANEOUS | Status: AC
  Administered 2018-02-03: 60 mg via SUBCUTANEOUS

## 2018-02-03 MED ORDER — ATORVASTATIN CALCIUM 20 MG PO TABS: 20.0000 mg | ORAL_TABLET | Freq: Every day | ORAL | 3 refills | Status: DC

## 2018-02-03 MED ORDER — TRAZODONE HCL 50 MG PO TABS: 150.0000 mg | ORAL_TABLET | Freq: Every day | ORAL | 2 refills | Status: DC

## 2018-02-03 MED ORDER — DICLOFENAC SODIUM 1 % TD GEL: TRANSDERMAL | 3 refills | Status: DC

## 2018-02-03 MED ORDER — NEXIUM 40 MG PO CPDR: 40.0000 mg | DELAYED_RELEASE_CAPSULE | Freq: Every day | ORAL | 3 refills | Status: DC

## 2018-02-03 MED ORDER — BUMETANIDE 1 MG PO TABS: 1.0000 mg | ORAL_TABLET | Freq: Two times a day (BID) | ORAL | 1 refills | Status: DC

## 2018-02-03 MED ORDER — CLONAZEPAM 0.5 MG PO TABS: 0.5000 mg | ORAL_TABLET | Freq: Every evening | ORAL | 2 refills | Status: DC | PRN

## 2018-02-03 NOTE — Progress Notes (Signed)
Subjective:   Patient ID: Patricia Peck, female    DOB: 04-21-1946, 72 y.o.   MRN: [id number]  Patricia Peck is a pleasant 72 y.o. year old female who presents to clinic today with Leg Swelling (Pt is here today C/O LLE Edema. Started 4-weeks-ago the swelling started.  Last night woke up several times with a feeling like it was cramping and she used spray.  Denies any current pain. She says she feels very jittery today.  Also req refills on all meds.)  on 02/03/2018 HPI:  LLE edema- Intermittent, noticed it 4 weeks ago.  Today is better. Felt cramps in her LLL last night.  No current pain.  Of note, she is already on a blood thinner, xarelto, for afib.  Also here for her prolia injection today.  Current Outpatient Medications on File Prior to Visit  Medication Sig Dispense Refill  . Alpha-D-Galactosidase (BEANO PO) Take by mouth 2 (two) times daily.    . Bepotastine Besilate (BEPREVE) 1.5 % SOLN Use one drop in each eye twice a day    . Calcium Carbonate-Vitamin D (CALCIUM 600/VITAMIN D) 600-400 MG-UNIT per tablet Take 1 tablet by mouth 2 (two) times daily.      . Cholecalciferol (VITAMIN D-3) 5000 UNITS TABS Take one tablet by mouth three times a week    . cyclobenzaprine (FLEXERIL) 5 MG tablet Take 0.5 to 1 tablet at bedtime as needed for muscle spasm 14 tablet 0  . denosumab (PROLIA) 60 MG/ML SOLN injection Inject 60 mg into the skin once. Administer in upper arm, thigh, or abdomen 180 mL 0  . fexofenadine (ALLEGRA) 60 MG tablet Take 1 tablet (60 mg total) by mouth 2 (two) times daily. (Patient taking differently: Take 60 mg by mouth 2 (two) times daily. PATIENT TAKES 180MG  AM) 60 tablet 3  . levocetirizine (XYZAL) 5 MG tablet Take 5 mg by mouth daily.  1  . Melatonin 5 MG TABS Take 2 tablets my mouth at bedtime    . metoprolol succinate (TOPROL-XL) 25 MG 24 hr tablet Take 3 tablets (75 mg total) by mouth daily. 270 tablet 3  . nystatin (MYCOSTATIN) 100000 UNIT/ML suspension SWISH  BY MOUTH 10 ML UP TO 4 TIMES A DAY 240 mL 1  . XARELTO 20 MG TABS tablet TAKE 1 TABLET BY MOUTH DAILY. 90 tablet 3   No current facility-administered medications on file prior to visit.     No Known Allergies  Past Medical History:  Diagnosis Date  . Allergy   . Arthritis    hands  . Asthma   . Atrial fibrillation ([place])   . Atrial flutter ([place])   . Depression   . Dysrhythmia    a-fib, on Xarelto  . GERD (gastroesophageal reflux disease)   . Hyperlipidemia   . Hypertension   . Hypothyroidism   . Osteoporosis   . TIA (transient ischemic attack)     Past Surgical History:  Procedure Laterality Date  . ABDOMINAL HYSTERECTOMY    . COLONOSCOPY    . MYRINGOTOMY WITH TUBE PLACEMENT Right 01/14/2016   Procedure: RIGHT MYRINGOTOMY WITH T-TUBE PLACEMENT;  Surgeon: Leta Baptist, MD;  Location: [place];  Service: ENT;  Laterality: Right;  . PARTIAL HYSTERECTOMY  1989  . TEE WITHOUT CARDIOVERSION N/A 12/22/2012   Procedure: TRANSESOPHAGEAL ECHOCARDIOGRAM (TEE);  Surgeon: Lelon Perla, MD;  Location: [facility] ENDOSCOPY;  Service: Cardiovascular;  Laterality: N/A;    Family History  Problem Relation Age of Onset  .  Breast cancer Mother   . Drug abuse Son   . Depression Son   . Stroke Paternal Aunt   . Heart disease Paternal Grandfather   . Diabetes Maternal Grandfather     Social History   Socioeconomic History  . Marital status: Married    Spouse name: Not on file  . Number of children: 2  . Years of education: Not on file  . Highest education level: Not on file  Occupational History    Employer: RETIRED  Social Needs  . Financial resource strain: Not on file  . Food insecurity:    Worry: Not on file    Inability: Not on file  . Transportation needs:    Medical: Not on file    Non-medical: Not on file  Tobacco Use  . Smoking status: Never Smoker  . Smokeless tobacco: Never Used  Substance and Sexual Activity  . Alcohol use: Yes    Comment: Rare  .  Drug use: No  . Sexual activity: Not on file  Lifestyle  . Physical activity:    Days per week: Not on file    Minutes per session: Not on file  . Stress: Not on file  Relationships  . Social connections:    Talks on phone: Not on file    Gets together: Not on file    Attends religious service: Not on file    Active member of club or organization: Not on file    Attends meetings of clubs or organizations: Not on file    Relationship status: Not on file  . Intimate partner violence:    Fear of current or ex partner: Not on file    Emotionally abused: Not on file    Physically abused: Not on file    Forced sexual activity: Not on file  Other Topics Concern  . Not on file  Social History Narrative   Does have HPOA- sons Eddie Dibbles and Wille Glaser.   Does desire life support if not futile.   Not sure about feeding tubes.   The PMH, PSH, Social History, Family History, Medications, and allergies have been reviewed in [facility], and have been updated if relevant.   Review of Systems  Constitutional: Negative.   Respiratory: Negative.   Cardiovascular: Positive for leg swelling. Negative for chest pain and palpitations.  Gastrointestinal: Negative.   Genitourinary: Negative.   Musculoskeletal: Negative.   Neurological: Negative.   All other systems reviewed and are negative.      Objective:    BP 132/70 (BP Location: Right Arm, Patient Position: Sitting, Cuff Size: Normal) Comment: feels irregular  Pulse (!) 143   Temp (!) 97.3 F (36.3 C) (Oral)   Ht 5\' 3"  (1.6 m)   Wt 131 lb 12.8 oz (59.8 kg)   BMI 23.35 kg/m    Physical Exam  Constitutional: She is oriented to person, place, and time. She appears well-developed and well-nourished. No distress.  HENT:  Head: Normocephalic and atraumatic.  Eyes: Conjunctivae are normal.  Neck: Normal range of motion.  Cardiovascular: Normal rate.  Pulmonary/Chest: Effort normal.  Musculoskeletal: Normal range of motion. She exhibits no edema.    Neurological: She is alert and oriented to person, place, and time. No cranial nerve deficit.  Skin: Skin is warm and dry. She is not diaphoretic.  Psychiatric: She has a normal mood and affect. Her behavior is normal. Judgment and thought content normal.  Nursing note and vitals reviewed.         Assessment &  Plan:   Osteoporosis without current pathological fracture, unspecified osteoporosis type - Plan: denosumab (PROLIA) injection 60 mg  Leg edema, left No follow-ups on file.

## 2018-02-03 NOTE — Telephone Encounter (Signed)
Copied from [place] [id number]. Topic: Quick Communication - Rx Refill/Question >> Feb 03, 2018  1:00 PM Neva Seat wrote: diclofenac sodium (VOLTAREN) 1 % GEL  Needing additional description to fill. Please call pharmacy back to fill Rx.  [address], [place] [address] Phone: [phone] Fax: [phone]

## 2018-02-03 NOTE — Patient Instructions (Signed)
Great to see you. Please keep me updated. 

## 2018-02-04 DIAGNOSIS — R600 Localized edema: Secondary | ICD-10-CM | POA: Insufficient documentation

## 2018-02-04 MED ORDER — DICLOFENAC SODIUM 1 % TD GEL: TRANSDERMAL | 3 refills | Status: DC

## 2018-02-04 NOTE — Assessment & Plan Note (Signed)
No edema or other acute findings on exam today. ?due to dependent edema caused by sitting more while her husband was in the hospital, has resolved now. She is already on a blood thinner which is reassuring. Reassurance provided. Advised to let me know if edema returns. The patient indicates understanding of these issues and agrees with the plan.

## 2018-02-04 NOTE — Telephone Encounter (Signed)
Resent Rx with corrected SIG/thx dmf

## 2018-03-15 ENCOUNTER — Encounter: Payer: Self-pay | Admitting: Family Medicine

## 2018-04-13 ENCOUNTER — Other Ambulatory Visit: Payer: Self-pay | Admitting: Family Medicine

## 2018-04-13 DIAGNOSIS — I10 Essential (primary) hypertension: Secondary | ICD-10-CM

## 2018-04-13 DIAGNOSIS — E039 Hypothyroidism, unspecified: Secondary | ICD-10-CM

## 2018-04-21 ENCOUNTER — Ambulatory Visit (INDEPENDENT_AMBULATORY_CARE_PROVIDER_SITE_OTHER): Payer: Medicare Other | Admitting: Behavioral Health

## 2018-04-21 ENCOUNTER — Encounter: Payer: Self-pay | Admitting: Behavioral Health

## 2018-04-21 ENCOUNTER — Other Ambulatory Visit (INDEPENDENT_AMBULATORY_CARE_PROVIDER_SITE_OTHER): Payer: Medicare Other

## 2018-04-21 VITALS — BP 142/78 | HR 59 | Ht 63.0 in | Wt 131.2 lb

## 2018-04-21 DIAGNOSIS — Z0000 Encounter for general adult medical examination without abnormal findings: Secondary | ICD-10-CM | POA: Diagnosis not present

## 2018-04-21 DIAGNOSIS — I10 Essential (primary) hypertension: Secondary | ICD-10-CM | POA: Diagnosis not present

## 2018-04-21 DIAGNOSIS — E039 Hypothyroidism, unspecified: Secondary | ICD-10-CM | POA: Diagnosis not present

## 2018-04-21 LAB — CBC
HCT: 38.7 % (ref 36.0–46.0)
Hemoglobin: 13.2 g/dL (ref 12.0–15.0)
MCHC: 34.2 g/dL (ref 30.0–36.0)
MCV: 88 fl (ref 78.0–100.0)
Platelets: 281 10*3/uL (ref 150.0–400.0)
RBC: 4.4 Mil/uL (ref 3.87–5.11)
RDW: 13.1 % (ref 11.5–15.5)
WBC: 7.2 10*3/uL (ref 4.0–10.5)

## 2018-04-21 LAB — LIPID PANEL
CHOLESTEROL: 162 mg/dL (ref 0–200)
HDL: 39 mg/dL — AB (ref >39.00)
LDL Cholesterol: 87 mg/dL (ref 0–99)
NONHDL: 123.09
Total CHOL/HDL Ratio: 4
Triglycerides: 182 mg/dL — ABNORMAL HIGH (ref 0.0–149.0)
VLDL: 36.4 mg/dL (ref 0.0–40.0)

## 2018-04-21 LAB — COMPREHENSIVE METABOLIC PANEL
ALBUMIN: 4.2 g/dL (ref 3.5–5.2)
ALK PHOS: 63 U/L (ref 39–117)
ALT: 19 U/L (ref 0–35)
AST: 17 U/L (ref 0–37)
BILIRUBIN TOTAL: 0.5 mg/dL (ref 0.2–1.2)
BUN: 15 mg/dL (ref 6–23)
CO2: 32 mEq/L (ref 19–32)
Calcium: 9.2 mg/dL (ref 8.4–10.5)
Chloride: 104 mEq/L (ref 96–112)
Creatinine, Ser: 0.95 mg/dL (ref 0.40–1.20)
GFR: 61.31 mL/min (ref >60.00)
GLUCOSE: 99 mg/dL (ref 70–99)
Potassium: 4 mEq/L (ref 3.5–5.1)
SODIUM: 142 meq/L (ref 135–145)
TOTAL PROTEIN: 7 g/dL (ref 6.0–8.3)

## 2018-04-21 LAB — TSH: TSH: 1.44 u[IU]/mL (ref 0.35–4.50)

## 2018-04-21 NOTE — Progress Notes (Addendum)
Subjective:   Patricia Peck is a 72 y.o. female who presents for Medicare Annual (Subsequent) preventive examination.  Review of Systems: No ROS.  Medicare Wellness Visit. Additional risk factors are reflected in the social history.   Sleep patterns: Takes Melatonin 10 mg at bedtime.  Home Safety/Smoke Alarms: Feels safe in home. Smoke alarms in place.  Living environment; residence and Firearm Safety: Lives in 2 story home. Son lives with them. Walk-in shower.    Female:        Mammo-  utd    Dexa scan-  Due in July      CCS- No longer doing routine screening due to age.      Objective:     Vitals: BP (!) 142/78 (BP Location: Right Arm, Patient Position: Sitting, Cuff Size: Normal)   Pulse (!) 59   Ht 5\' 3"  (1.6 m)   Wt 131 lb 3.2 oz (59.5 kg)   SpO2 98%   BMI 23.24 kg/m   Body mass index is 23.24 kg/m.  Advanced Directives 04/21/2018 04/21/2017 01/14/2016 01/08/2016 12/22/2012  Does Patient Have a Medical Advance Directive? Yes Yes Yes Yes Patient has advance directive, copy not in chart  Type of Advance Directive [place];Living will [place];Living will [place];Living will [place];Living will [place];Living will  Does patient want to make changes to medical advance directive? - - No - Patient declined - -  Copy of [place] in Chart? No - copy requested No - copy requested No - copy requested - -  Pre-existing out of facility DNR order (yellow form or pink MOST form) - - - - No    Tobacco Social History   Tobacco Use  Smoking Status Never Smoker  Smokeless Tobacco Never Used     Counseling given: Not Answered   Clinical Intake: Pain : No/denies pain   Past Medical History:  Diagnosis Date  . Allergy   . Arthritis    hands  . Asthma   . Atrial fibrillation ([place])   . Atrial flutter ([place])   . Depression   . Dysrhythmia    a-fib, on  Xarelto  . GERD (gastroesophageal reflux disease)   . Hyperlipidemia   . Hypertension   . Hypothyroidism   . Osteoporosis   . TIA (transient ischemic attack)    Past Surgical History:  Procedure Laterality Date  . ABDOMINAL HYSTERECTOMY    . COLONOSCOPY    . MYRINGOTOMY WITH TUBE PLACEMENT Right 01/14/2016   Procedure: RIGHT MYRINGOTOMY WITH T-TUBE PLACEMENT;  Surgeon: Leta Baptist, MD;  Location: [facility];  Service: ENT;  Laterality: Right;  . PARTIAL HYSTERECTOMY  1989  . TEE WITHOUT CARDIOVERSION N/A 12/22/2012   Procedure: TRANSESOPHAGEAL ECHOCARDIOGRAM (TEE);  Surgeon: Lelon Perla, MD;  Location[facility] ENDOSCOPY;  Service: Cardiovascular;  Laterality: N/A;   Family History  Problem Relation Age of Onset  . Breast cancer Mother   . Drug abuse Son   . Depression Son   . Stroke Paternal Aunt   . Heart disease Paternal Grandfather   . Diabetes Maternal Grandfather    Social History   Socioeconomic History  . Marital status: Married    Spouse name: Not on file  . Number of children: 2  . Years of education: Not on file  . Highest education level: Not on file  Occupational History    Employer: RETIRED  Social Needs  .  Financial resource strain: Not on file  . Food insecurity:    Worry: Not on file    Inability: Not on file  . Transportation needs:    Medical: Not on file    Non-medical: Not on file  Tobacco Use  . Smoking status: Never Smoker  . Smokeless tobacco: Never Used  Substance and Sexual Activity  . Alcohol use: Yes    Comment: Rare  . Drug use: No  . Sexual activity: Yes  Lifestyle  . Physical activity:    Days per week: Not on file    Minutes per session: Not on file  . Stress: Not on file  Relationships  . Social connections:    Talks on phone: Not on file    Gets together: Not on file    Attends religious service: Not on file    Active member of club or organization: Not on file    Attends meetings of clubs or organizations: Not  on file    Relationship status: Not on file  Other Topics Concern  . Not on file  Social History Narrative   Does have HPOA- sons Eddie Dibbles and Wille Glaser.   Does desire life support if not futile.   Not sure about feeding tubes.    Outpatient Encounter Medications as of 04/21/2018  Medication Sig  . Alpha-D-Galactosidase (BEANO PO) Take by mouth 2 (two) times daily.  Marland Kitchen atorvastatin (LIPITOR) 20 MG tablet Take 1 tablet (20 mg total) by mouth daily.  . Bepotastine Besilate (BEPREVE) 1.5 % SOLN Use one drop in each eye twice a day  . bumetanide (BUMEX) 1 MG tablet Take 1 tablet (1 mg total) by mouth 2 (two) times daily.  Marland Kitchen buPROPion (WELLBUTRIN XL) 150 MG 24 hr tablet Take 1 tablet (150 mg total) by mouth daily.  . Calcium Carbonate-Vitamin D (CALCIUM 600/VITAMIN D) 600-400 MG-UNIT per tablet Take 1 tablet by mouth 2 (two) times daily.    . Cholecalciferol (VITAMIN D-3) 5000 UNITS TABS Take one tablet by mouth three times a week  . clonazePAM (KLONOPIN) 0.5 MG tablet Take 1 tablet (0.5 mg total) by mouth at bedtime as needed for anxiety.  . cyclobenzaprine (FLEXERIL) 5 MG tablet Take 0.5 to 1 tablet at bedtime as needed for muscle spasm  . denosumab (PROLIA) 60 MG/ML SOLN injection Inject 60 mg into the skin once. Administer in upper arm, thigh, or abdomen  . diclofenac sodium (VOLTAREN) 1 % GEL Apply 2g to affected area qid prn  . famotidine (PEPCID AC) 10 MG chewable tablet Chew 1 tablet (10 mg total) by mouth daily.  . fexofenadine (ALLEGRA) 60 MG tablet Take 1 tablet (60 mg total) by mouth 2 (two) times daily. (Patient taking differently: Take 60 mg by mouth 2 (two) times daily. PATIENT TAKES 180MG  AM)  . Fluticasone-Salmeterol (ADVAIR DISKUS) 100-50 MCG/DOSE AEPB Inhale 1 puff into the lungs 2 (two) times daily.  Marland Kitchen levocetirizine (XYZAL) 5 MG tablet Take 5 mg by mouth daily.  Marland Kitchen levothyroxine (SYNTHROID, LEVOTHROID) 50 MCG tablet Take 1 tablet (50 mcg total) by mouth daily.  . Melatonin 5 MG TABS  Take 2 tablets my mouth at bedtime  . metoprolol succinate (TOPROL-XL) 25 MG 24 hr tablet Take 3 tablets (75 mg total) by mouth daily.  Marland Kitchen NEXIUM 40 MG capsule Take 1 capsule (40 mg total) by mouth daily.  Marland Kitchen nystatin (MYCOSTATIN) 100000 UNIT/ML suspension SWISH BY MOUTH 10 ML UP TO 4 TIMES A DAY  . potassium chloride (K-DUR) 10  MEQ tablet Take 1 tablet (10 mEq total) by mouth 2 (two) times daily.  . traZODone (DESYREL) 50 MG tablet Take 3 tablets (150 mg total) by mouth at bedtime.  Alveda Reasons 20 MG TABS tablet TAKE 1 TABLET BY MOUTH DAILY.   No facility-administered encounter medications on file as of 04/21/2018.     Activities of Daily Living In your present state of health, do you have any difficulty performing the following activities: 04/21/2018 04/21/2017  Hearing? 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 N N  Comment hx cataract sx. reading glasses.  -  Difficulty concentrating or making decisions? N N  Walking or climbing stairs? N N  Dressing or bathing? N N  Doing errands, shopping? N N  Preparing Food and eating ? N N  Using the Toilet? N N  In the past six months, have you accidently leaked urine? N N  Do you have problems with loss of bowel control? N N  Managing your Medications? N N  Managing your Finances? N N  Housekeeping or managing your Housekeeping? N N  Some recent data might be hidden    Patient Care Team: Lucille Passy, MD as PCP - General Stanford Breed Denice Bors, MD as Consulting Physician (Cardiology) Leta Baptist, MD as Consulting Physician (Otolaryngology) Mosetta Anis, MD as Referring Physician (Allergy)    Assessment:   This is a routine wellness examination for [place]. Physical assessment deferred to PCP.  Exercise Activities and Dietary recommendations   Diet (meal preparation, eat out, water intake, caffeinated beverages, dairy products, fruits and vegetables): well balanced Breakfast:skipped Lunch: salad or sandwich and  fruit Dinner:   Hydrologist    . Increase physical activity       Fall Risk Fall Risk  04/21/2018 08/05/2017 07/29/2017 07/23/2017 04/21/2017  Falls in the past year? No No Yes No No  Number falls in past yr: - - 1 - -  Injury with Fall? - - No - -     Depression Screen PHQ 2/9 Scores 04/21/2018 12/16/2017 08/05/2017 07/29/2017  PHQ - 2 Score 0 3 0 0  PHQ- 9 Score - 18 - -     Cognitive Function MMSE - Mini Mental State Exam 04/21/2018 04/21/2017  Orientation to time 5 5  Orientation to Place 5 5  Registration 3 3  Attention/ Calculation 5 0  Recall 3 1  Recall-comments - pt was unable to recall 2 of 3 words  Language- name 2 objects 2 0  Language- repeat 1 1  Language- follow 3 step command 3 3  Language- read & follow direction 1 0  Write a sentence 1 0  Copy design 1 0  Total score 30 18        Immunization History  Administered Date(s) Administered  . Influenza Split 09/04/2011, 08/11/2012, 08/04/2017  . Influenza Whole 08/07/2009  . Influenza, High Dose Seasonal PF 08/28/2014  . Influenza,inj,Quad PF,6+ Mos 08/24/2013, 07/20/2015, 07/29/2016, 08/04/2017  . Pneumococcal Conjugate-13 04/12/2014  . Pneumococcal Polysaccharide-23 02/05/2006, 04/08/2012  . Td 01/29/2005, 04/17/2015  . Zoster 04/16/2007    Screening Tests Health Maintenance  Topic Date Due  . INFLUENZA VACCINE  06/03/2018  . MAMMOGRAM  03/16/2019  . COLONOSCOPY  07/20/2024  . TETANUS/TDAP  04/16/2025  . DEXA SCAN  Completed  . Hepatitis C Screening  Completed  . PNA vac Low Risk Adult  Completed      Plan:  CONCERNS FOR PCP: -Pt is taking Xyzal and Allegra.? -Pt traveling to [place] in a few months-immunizations?   Follow up with Dr.Aron as scheduled 04/26/18  Please schedule your next medicare wellness visit with me in 1 yr.  Continue to eat heart healthy diet (full of fruits, vegetables, whole grains, lean protein, water--limit salt, fat, and sugar intake) and increase  physical activity as tolerated.  Continue doing brain stimulating activities (puzzles, reading, adult coloring books, staying active) to keep memory sharp.   Bring a copy of your living will and/or healthcare power of attorney to your next office visit.  I have personally reviewed and noted the following in the patient's chart:   . Medical and social history . Use of alcohol, tobacco or illicit drugs  . Current medications and supplements . Functional ability and status . Nutritional status . Physical activity . Advanced directives . List of other physicians . Hospitalizations, surgeries, and ER visits in previous 12 months . Vitals . Screenings to include cognitive, depression, and falls . Referrals and appointments  In addition, I have reviewed and discussed with patient certain preventive protocols, quality metrics, and best practice recommendations. A written personalized care plan for preventive services as well as general preventive health recommendations were provided to patient.     Shela Nevin, [place]  04/21/2018    Note reviewed and agree with above.  In regards to xyzal and allegra, I would recommend that she choose one of these rather than take both at the same time.  It is ok if she alternates them.  For her upcoming trip to [place], immunization depends on the region she will be visiting.  I would recommend that she contact [facility] at [phone] to go over these recommendations.    Luetta Nutting, DO

## 2018-04-21 NOTE — Patient Instructions (Signed)
Follow up with Dr.Aron as scheduled  Please schedule your next medicare wellness visit with me in 1 yr.  Continue to eat heart healthy diet (full of fruits, vegetables, whole grains, lean protein, water--limit salt, fat, and sugar intake) and increase physical activity as tolerated.  Continue doing brain stimulating activities (puzzles, reading, adult coloring books, staying active) to keep memory sharp.   Bring a copy of your living will and/or healthcare power of attorney to your next office visit.   Patricia Peck , Thank you for taking time to come for your Medicare Wellness Visit. I appreciate your ongoing commitment to your health goals. Please review the following plan we discussed and let me know if I can assist you in the future.   These are the goals we discussed: Goals    . Increase physical activity       This is a list of the screening recommended for you and due dates:  Health Maintenance  Topic Date Due  . Flu Shot  06/03/2018  . Mammogram  03/16/2019  . Colon Cancer Screening  07/20/2024  . Tetanus Vaccine  04/16/2025  . DEXA scan (bone density measurement)  Completed  .  Hepatitis C: One time screening is recommended by Center for Disease Control  (CDC) for  adults born from [birth year] through 1966.   Completed  . Pneumonia vaccines  Completed    Health Maintenance for Postmenopausal Women Menopause is a normal process in which your reproductive ability comes to an end. This process happens gradually over a span of months to years, usually between the ages of 32 and 83. Menopause is complete when you have missed 12 consecutive menstrual periods. It is important to talk with your health care provider about some of the most common conditions that affect postmenopausal women, such as heart disease, cancer, and bone loss (osteoporosis). Adopting a healthy lifestyle and getting preventive care can help to promote your health and wellness. Those actions can also lower your chances  of developing some of these common conditions. What should I know about menopause? During menopause, you may experience a number of symptoms, such as:  Moderate-to-severe hot flashes.  Night sweats.  Decrease in sex drive.  Mood swings.  Headaches.  Tiredness.  Irritability.  Memory problems.  Insomnia.  Choosing to treat or not to treat menopausal changes is an individual decision that you make with your health care provider. What should I know about hormone replacement therapy and supplements? Hormone therapy products are effective for treating symptoms that are associated with menopause, such as hot flashes and night sweats. Hormone replacement carries certain risks, especially as you become older. If you are thinking about using estrogen or estrogen with progestin treatments, discuss the benefits and risks with your health care provider. What should I know about heart disease and stroke? Heart disease, heart attack, and stroke become more likely as you age. This may be due, in part, to the hormonal changes that your body experiences during menopause. These can affect how your body processes dietary fats, triglycerides, and cholesterol. Heart attack and stroke are both medical emergencies. There are many things that you can do to help prevent heart disease and stroke:  Have your blood pressure checked at least every 1-2 years. High blood pressure causes heart disease and increases the risk of stroke.  If you are 23-10 years old, ask your health care provider if you should take aspirin to prevent a heart attack or a stroke.  Do not use  any tobacco products, including cigarettes, chewing tobacco, or electronic cigarettes. If you need help quitting, ask your health care provider.  It is important to eat a healthy diet and maintain a healthy weight. ? Be sure to include plenty of vegetables, fruits, low-fat dairy products, and lean protein. ? Avoid eating foods that are high in  solid fats, added sugars, or salt (sodium).  Get regular exercise. This is one of the most important things that you can do for your health. ? Try to exercise for at least 150 minutes each week. The type of exercise that you do should increase your heart rate and make you sweat. This is known as moderate-intensity exercise. ? Try to do strengthening exercises at least twice each week. Do these in addition to the moderate-intensity exercise.  Know your numbers.Ask your health care provider to check your cholesterol and your blood glucose. Continue to have your blood tested as directed by your health care provider.  What should I know about cancer screening? There are several types of cancer. Take the following steps to reduce your risk and to catch any cancer development as early as possible. Breast Cancer  Practice breast self-awareness. ? This means understanding how your breasts normally appear and feel. ? It also means doing regular breast self-exams. Let your health care provider know about any changes, no matter how small.  If you are 30 or older, have a clinician do a breast exam (clinical breast exam or CBE) every year. Depending on your age, family history, and medical history, it may be recommended that you also have a yearly breast X-ray (mammogram).  If you have a family history of breast cancer, talk with your health care provider about genetic screening.  If you are at high risk for breast cancer, talk with your health care provider about having an MRI and a mammogram every year.  Breast cancer (BRCA) gene test is recommended for women who have family members with BRCA-related cancers. Results of the assessment will determine the need for genetic counseling and BRCA1 and for BRCA2 testing. BRCA-related cancers include these types: ? Breast. This occurs in males or females. ? Ovarian. ? Tubal. This may also be called fallopian tube cancer. ? Cancer of the abdominal or pelvic  lining (peritoneal cancer). ? Prostate. ? Pancreatic.  Cervical, Uterine, and Ovarian Cancer Your health care provider may recommend that you be screened regularly for cancer of the pelvic organs. These include your ovaries, uterus, and vagina. This screening involves a pelvic exam, which includes checking for microscopic changes to the surface of your cervix (Pap test).  For women ages 21-65, health care providers may recommend a pelvic exam and a Pap test every three years. For women ages 67-65, they may recommend the Pap test and pelvic exam, combined with testing for human papilloma virus (HPV), every five years. Some types of HPV increase your risk of cervical cancer. Testing for HPV may also be done on women of any age who have unclear Pap test results.  Other health care providers may not recommend any screening for nonpregnant women who are considered low risk for pelvic cancer and have no symptoms. Ask your health care provider if a screening pelvic exam is right for you.  If you have had past treatment for cervical cancer or a condition that could lead to cancer, you need Pap tests and screening for cancer for at least 20 years after your treatment. If Pap tests have been discontinued for you, your  risk factors (such as having a new sexual partner) need to be reassessed to determine if you should start having screenings again. Some women have medical problems that increase the chance of getting cervical cancer. In these cases, your health care provider may recommend that you have screening and Pap tests more often.  If you have a family history of uterine cancer or ovarian cancer, talk with your health care provider about genetic screening.  If you have vaginal bleeding after reaching menopause, tell your health care provider.  There are currently no reliable tests available to screen for ovarian cancer.  Lung Cancer Lung cancer screening is recommended for adults 23-76 years old who  are at high risk for lung cancer because of a history of smoking. A yearly low-dose CT scan of the lungs is recommended if you:  Currently smoke.  Have a history of at least 30 pack-years of smoking and you currently smoke or have quit within the past 15 years. A pack-year is smoking an average of one pack of cigarettes per day for one year.  Yearly screening should:  Continue until it has been 15 years since you quit.  Stop if you develop a health problem that would prevent you from having lung cancer treatment.  Colorectal Cancer  This type of cancer can be detected and can often be prevented.  Routine colorectal cancer screening usually begins at age 37 and continues through age 64.  If you have risk factors for colon cancer, your health care provider may recommend that you be screened at an earlier age.  If you have a family history of colorectal cancer, talk with your health care provider about genetic screening.  Your health care provider may also recommend using home test kits to check for hidden blood in your stool.  A small camera at the end of a tube can be used to examine your colon directly (sigmoidoscopy or colonoscopy). This is done to check for the earliest forms of colorectal cancer.  Direct examination of the colon should be repeated every 5-10 years until age 19. However, if early forms of precancerous polyps or small growths are found or if you have a family history or genetic risk for colorectal cancer, you may need to be screened more often.  Skin Cancer  Check your skin from head to toe regularly.  Monitor any moles. Be sure to tell your health care provider: ? About any new moles or changes in moles, especially if there is a change in a mole's shape or color. ? If you have a mole that is larger than the size of a pencil eraser.  If any of your family members has a history of skin cancer, especially at a young age, talk with your health care provider about  genetic screening.  Always use sunscreen. Apply sunscreen liberally and repeatedly throughout the day.  Whenever you are outside, protect yourself by wearing long sleeves, pants, a wide-brimmed hat, and sunglasses.  What should I know about osteoporosis? Osteoporosis is a condition in which bone destruction happens more quickly than new bone creation. After menopause, you may be at an increased risk for osteoporosis. To help prevent osteoporosis or the bone fractures that can happen because of osteoporosis, the following is recommended:  If you are 92-86 years old, get at least 1,000 mg of calcium and at least 600 mg of vitamin D per day.  If you are older than age 4 but younger than age 61, get at least 39,200  mg of calcium and at least 600 mg of vitamin D per day.  If you are older than age 107, get at least 1,200 mg of calcium and at least 800 mg of vitamin D per day.  Smoking and excessive alcohol intake increase the risk of osteoporosis. Eat foods that are rich in calcium and vitamin D, and do weight-bearing exercises several times each week as directed by your health care provider. What should I know about how menopause affects my mental health? Depression may occur at any age, but it is more common as you become older. Common symptoms of depression include:  Low or sad mood.  Changes in sleep patterns.  Changes in appetite or eating patterns.  Feeling an overall lack of motivation or enjoyment of activities that you previously enjoyed.  Frequent crying spells.  Talk with your health care provider if you think that you are experiencing depression. What should I know about immunizations? It is important that you get and maintain your immunizations. These include:  Tetanus, diphtheria, and pertussis (Tdap) booster vaccine.  Influenza every year before the flu season begins.  Pneumonia vaccine.  Shingles vaccine.  Your health care provider may also recommend other  immunizations. This information is not intended to replace advice given to you by your health care provider. Make sure you discuss any questions you have with your health care provider. Document Released: 12/12/2005 Document Revised: 05/09/2016 Document Reviewed: 07/24/2015 Elsevier Interactive Patient Education  2018 [employer].

## 2018-04-22 NOTE — Progress Notes (Signed)
I reviewed health advisor's note, was available for consultation, and agree with documentation and plan.  

## 2018-04-23 ENCOUNTER — Other Ambulatory Visit: Payer: Self-pay

## 2018-04-26 ENCOUNTER — Ambulatory Visit (INDEPENDENT_AMBULATORY_CARE_PROVIDER_SITE_OTHER): Payer: Medicare Other | Admitting: Family Medicine

## 2018-04-26 ENCOUNTER — Encounter: Payer: Self-pay | Admitting: Family Medicine

## 2018-04-26 VITALS — BP 138/80 | HR 52 | Temp 97.8°F | Ht 63.0 in | Wt 129.4 lb

## 2018-04-26 DIAGNOSIS — I4891 Unspecified atrial fibrillation: Secondary | ICD-10-CM | POA: Diagnosis not present

## 2018-04-26 DIAGNOSIS — Z01419 Encounter for gynecological examination (general) (routine) without abnormal findings: Secondary | ICD-10-CM | POA: Diagnosis not present

## 2018-04-26 DIAGNOSIS — G4709 Other insomnia: Secondary | ICD-10-CM | POA: Diagnosis not present

## 2018-04-26 DIAGNOSIS — M810 Age-related osteoporosis without current pathological fracture: Secondary | ICD-10-CM

## 2018-04-26 DIAGNOSIS — E039 Hypothyroidism, unspecified: Secondary | ICD-10-CM

## 2018-04-26 DIAGNOSIS — I10 Essential (primary) hypertension: Secondary | ICD-10-CM

## 2018-04-26 DIAGNOSIS — F329 Major depressive disorder, single episode, unspecified: Secondary | ICD-10-CM | POA: Diagnosis not present

## 2018-04-26 DIAGNOSIS — F32A Depression, unspecified: Secondary | ICD-10-CM

## 2018-04-26 NOTE — Assessment & Plan Note (Signed)
Reasonable control. Continue Trazodone.

## 2018-04-26 NOTE — Assessment & Plan Note (Signed)
Clinically euthyroid.  No changes made.

## 2018-04-26 NOTE — Assessment & Plan Note (Signed)
Reviewed preventive care protocols, scheduled due services, and updated immunizations Discussed nutrition, exercise, diet, and healthy lifestyle.  

## 2018-04-26 NOTE — Assessment & Plan Note (Signed)
Well controlled.  No changes made. 

## 2018-04-26 NOTE — Assessment & Plan Note (Signed)
Rate controlled. Also on Xarelto. Followed by cardiology.

## 2018-04-26 NOTE — Assessment & Plan Note (Signed)
On prolia. Due for dexa in 05/2018.

## 2018-04-26 NOTE — Progress Notes (Signed)
Subjective:   Patient ID: Patricia Peck, female    DOB: 07-07-1946, 72 y.o.   MRN: [id number]  Patricia Peck is a pleasant 72 y.o. year old female who presents to clinic today with her husband for her Follow-up (Patient is here today to F/U after AWV on 6.19.19.  She had fasting labs completed at that time. She is going to [place] in November and would like to know what vaccines are needed for this.  Patient given CDC recom and travel clinic information.) and follow up of chronic medical conditions on 04/26/2018   Patient is here today to F/U after AWV on 6.19.19. She had fasting labs completed at that time. She is going to [place] in November and would like to know what vaccines are needed for this. Patient given CDC recom and travel clinic information  Mammogram 03/15/18 Colonoscopy 07/20/14 Zostavax 04/16/2007 DEXA 05/05/16 Td 01/30/08 Pneumovax 04/08/2012 Prevnar 13 04/12/2014  Health Maintenance  Topic Date Due  . INFLUENZA VACCINE  06/03/2018  . MAMMOGRAM  03/16/2019  . COLONOSCOPY  07/20/2024  . TETANUS/TDAP  04/16/2025  . DEXA SCAN  Completed  . Hepatitis C Screening  Completed  . PNA vac Low Risk Adult  Completed     Still playing tennis multiple times per week.  Insomnia- persistent issue for her. Currently taking Melatonin and trazodone 150 mg qhs.  GERD-remains on Dexilant.  Afib- followed by Dr. Stanford Breed  Last saw him on 07/20/17- note reviewed. No changes made.  Continued on Toprol but increased dose to 50 mg daily, while continuing  Xarelto, along with statin at previous doses.  Carotid dopplers 12/24/16  Osteoporosis-  Receiving prolia.  Last dose 02/01/18 DEXA 05/05/16- improvement/osteopenia.  Hypothyroidism- on synthroid 50 mcg daily. Denies any symptoms of hypo or hyperthyroidism.   Lab Results  Component Value Date   TSH 1.44 04/21/2018   HLD- has been well controlled on lipitor. Lab Results  Component Value Date   CHOL 162  04/21/2018   HDL 39.00 (L) 04/21/2018   LDLCALC 87 04/21/2018   LDLDIRECT 129.2 05/28/2007   TRIG 182.0 (H) 04/21/2018   CHOLHDL 4 04/21/2018   Lab Results  Component Value Date   NA 142 04/21/2018   K 4.0 04/21/2018   CL 104 04/21/2018   CO2 32 04/21/2018   Lab Results  Component Value Date   ALT 19 04/21/2018   AST 17 04/21/2018   ALKPHOS 63 04/21/2018   BILITOT 0.5 04/21/2018   Lab Results  Component Value Date   WBC 7.2 04/21/2018   HGB 13.2 04/21/2018   HCT 38.7 04/21/2018   MCV 88.0 04/21/2018   PLT 281.0 04/21/2018     Current Outpatient Medications on File Prior to Visit  Medication Sig Dispense Refill  . Alpha-D-Galactosidase (BEANO PO) Take by mouth 2 (two) times daily.    Marland Kitchen atorvastatin (LIPITOR) 20 MG tablet Take 1 tablet (20 mg total) by mouth daily. 90 tablet 3  . Bepotastine Besilate (BEPREVE) 1.5 % SOLN Use one drop in each eye twice a day    . bumetanide (BUMEX) 1 MG tablet Take 1 tablet (1 mg total) by mouth 2 (two) times daily. 180 tablet 1  . buPROPion (WELLBUTRIN XL) 150 MG 24 hr tablet Take 1 tablet (150 mg total) by mouth daily. 90 tablet 1  . Calcium Carbonate-Vitamin D (CALCIUM 600/VITAMIN D) 600-400 MG-UNIT per tablet Take 1 tablet by mouth 2 (two) times daily.      Marland Kitchen  Cholecalciferol (VITAMIN D-3) 5000 UNITS TABS Take one tablet by mouth three times a week    . clonazePAM (KLONOPIN) 0.5 MG tablet Take 1 tablet (0.5 mg total) by mouth at bedtime as needed for anxiety. 30 tablet 2  . cyclobenzaprine (FLEXERIL) 5 MG tablet Take 0.5 to 1 tablet at bedtime as needed for muscle spasm 14 tablet 0  . denosumab (PROLIA) 60 MG/ML SOLN injection Inject 60 mg into the skin once. Administer in upper arm, thigh, or abdomen 180 mL 0  . diclofenac sodium (VOLTAREN) 1 % GEL Apply 2g to affected area qid prn 100 g 3  . famotidine (PEPCID AC) 10 MG chewable tablet Chew 1 tablet (10 mg total) by mouth daily. 90 tablet prm  . fexofenadine (ALLEGRA) 60 MG tablet Take  1 tablet (60 mg total) by mouth 2 (two) times daily. (Patient taking differently: Take 60 mg by mouth 2 (two) times daily. PATIENT TAKES 180MG  AM) 60 tablet 3  . Fluticasone-Salmeterol (ADVAIR DISKUS) 100-50 MCG/DOSE AEPB Inhale 1 puff into the lungs 2 (two) times daily. 180 each 1  . levocetirizine (XYZAL) 5 MG tablet Take 5 mg by mouth daily.  1  . levothyroxine (SYNTHROID, LEVOTHROID) 50 MCG tablet Take 1 tablet (50 mcg total) by mouth daily. 90 tablet 1  . Melatonin 5 MG TABS Take 2 tablets my mouth at bedtime    . metoprolol succinate (TOPROL-XL) 25 MG 24 hr tablet Take 3 tablets (75 mg total) by mouth daily. 270 tablet 3  . NEXIUM 40 MG capsule Take 1 capsule (40 mg total) by mouth daily. 90 capsule 3  . nystatin (MYCOSTATIN) 100000 UNIT/ML suspension SWISH BY MOUTH 10 ML UP TO 4 TIMES A DAY 240 mL 1  . potassium chloride (K-DUR) 10 MEQ tablet Take 1 tablet (10 mEq total) by mouth 2 (two) times daily. 180 tablet 3  . traZODone (DESYREL) 50 MG tablet Take 3 tablets (150 mg total) by mouth at bedtime. 270 tablet 2  . XARELTO 20 MG TABS tablet TAKE 1 TABLET BY MOUTH DAILY. 90 tablet 3   No current facility-administered medications on file prior to visit.     No Known Allergies  Past Medical History:  Diagnosis Date  . Allergy   . Arthritis    hands  . Asthma   . Atrial fibrillation ([place])   . Atrial flutter ([place])   . Depression   . Dysrhythmia    a-fib, on Xarelto  . GERD (gastroesophageal reflux disease)   . Hyperlipidemia   . Hypertension   . Hypothyroidism   . Osteoporosis   . TIA (transient ischemic attack)     Past Surgical History:  Procedure Laterality Date  . ABDOMINAL HYSTERECTOMY    . COLONOSCOPY    . MYRINGOTOMY WITH TUBE PLACEMENT Right 01/14/2016   Procedure: RIGHT MYRINGOTOMY WITH T-TUBE PLACEMENT;  Surgeon: Leta Baptist, MD;  Location: [place];  Service: ENT;  Laterality: Right;  . PARTIAL HYSTERECTOMY  1989  . TEE WITHOUT CARDIOVERSION N/A  12/22/2012   Procedure: TRANSESOPHAGEAL ECHOCARDIOGRAM (TEE);  Surgeon: Lelon Perla, MD;  Location: [facility] ENDOSCOPY;  Service: Cardiovascular;  Laterality: N/A;    Family History  Problem Relation Age of Onset  . Breast cancer Mother   . Drug abuse Son   . Depression Son   . Stroke Paternal Aunt   . Heart disease Paternal Grandfather   . Diabetes Maternal Grandfather     Social History   Socioeconomic History  .  Marital status: Married    Spouse name: Not on file  . Number of children: 2  . Years of education: Not on file  . Highest education level: Not on file  Occupational History    Employer: RETIRED  Social Needs  . Financial resource strain: Not on file  . Food insecurity:    Worry: Not on file    Inability: Not on file  . Transportation needs:    Medical: Not on file    Non-medical: Not on file  Tobacco Use  . Smoking status: Never Smoker  . Smokeless tobacco: Never Used  Substance and Sexual Activity  . Alcohol use: Yes    Comment: Rare  . Drug use: No  . Sexual activity: Yes  Lifestyle  . Physical activity:    Days per week: Not on file    Minutes per session: Not on file  . Stress: Not on file  Relationships  . Social connections:    Talks on phone: Not on file    Gets together: Not on file    Attends religious service: Not on file    Active member of club or organization: Not on file    Attends meetings of clubs or organizations: Not on file    Relationship status: Not on file  . Intimate partner violence:    Fear of current or ex partner: Not on file    Emotionally abused: Not on file    Physically abused: Not on file    Forced sexual activity: Not on file  Other Topics Concern  . Not on file  Social History Narrative   Does have HPOA- sons Eddie Dibbles and Wille Glaser.   Does desire life support if not futile.   Not sure about feeding tubes.   The PMH, PSH, Social History, Family History, Medications, and allergies have been reviewed in [facility], and have been  updated if relevant.  Review of Systems  Constitutional: Negative.   HENT: Negative.   Eyes: Negative.   Respiratory: Negative.   Cardiovascular: Negative.   Gastrointestinal: Negative.   Endocrine: Negative.   Genitourinary: Negative.   Musculoskeletal: Negative.   Skin: Negative.   Allergic/Immunologic: Negative.   Neurological: Negative.   Hematological: Negative.   Psychiatric/Behavioral: Positive for sleep disturbance. Negative for agitation, behavioral problems, confusion, decreased concentration, dysphoric mood and hallucinations. The patient is not nervous/anxious and is not hyperactive.   All other systems reviewed and are negative.      Objective:    BP 138/80 (BP Location: Left Arm, Patient Position: Sitting, Cuff Size: Normal)   Pulse (!) 52   Temp 97.8 F (36.6 C) (Oral)   Ht 5\' 3"  (1.6 m)   Wt 129 lb 6.4 oz (58.7 kg)   SpO2 96%   BMI 22.92 kg/m    Physical Exam  Constitutional: She is oriented to person, place, and time. She appears well-developed and well-nourished. No distress.  HENT:  Head: Normocephalic and atraumatic.  Eyes: Conjunctivae are normal.  Neck: Normal range of motion.  Cardiovascular: Normal rate and regular rhythm.  Pulmonary/Chest: Effort normal and breath sounds normal.  Abdominal: Soft. Bowel sounds are normal.  Musculoskeletal: Normal range of motion. She exhibits no edema.  Neurological: She is alert and oriented to person, place, and time. She displays normal reflexes. No cranial nerve deficit. She exhibits normal muscle tone. Coordination normal.  Skin: Skin is warm and dry. She is not diaphoretic.  Psychiatric: She has a normal mood and affect. Her behavior is normal.  Judgment and thought content normal.  Nursing note and vitals reviewed.         Assessment & Plan:   Essential hypertension  Hypothyroidism, unspecified type  Well woman exam  Atrial fibrillation, unspecified type ([place])  Depression, unspecified  depression type  Osteoporosis, unspecified osteoporosis type, unspecified pathological fracture presence No follow-ups on file.

## 2018-04-26 NOTE — Patient Instructions (Signed)
Have a great trip and Happy Birthday!

## 2018-05-07 ENCOUNTER — Telehealth: Payer: Self-pay | Admitting: Family Medicine

## 2018-05-07 NOTE — Telephone Encounter (Signed)
Patient called to actually speak with a nurse regarding the changes on a few of her medications.  Please advise.  CB [phone].

## 2018-05-07 NOTE — Telephone Encounter (Signed)
Medication: levothyroxine (SYNTHROID, LEVOTHROID) 50 MCG tablet [[id number]]  traZODone (DESYREL) 50 MG tablet [[id number]]   Pt states medications  have increased in price. Questioning if alternative medications can be rx'd that are less expensive.  Please advise: [phone]

## 2018-05-07 NOTE — Telephone Encounter (Signed)
Copied from [place] [id number]. Topic: Quick Communication - Rx Refill/Question >> May 07, 2018 11:01 AM Oliver Pila B wrote: Medication: levothyroxine (SYNTHROID, LEVOTHROID) 50 MCG tablet [[id number]]  traZODone (DESYREL) 50 MG tablet [[id number]]   Pt called b/c the medications above have increased in price and the pt is looking for more economic medications that can be taken, contact pt to advise

## 2018-05-07 NOTE — Telephone Encounter (Signed)
TA-I spoke to pt/so her current pharmacy is charging her quite a bit for 3 prescriptions/I told her that since she takes 3qhs of the Trazodone 50mg  that we could do 150mg  1qhs #90 for $10.00 at [facility]  Then the Levothyroxine 43mcg 1qd #90 for $10.00  Then we also talked about the Xyzal/she said that it is very expensive/I told her that the only thing that [facility] had to offer was Loratadine 10mg  tablets and they would also be $10.00 for #90/she said that she would be willing to try that to see if it helps her because she cannot afford what she is doing now/Plz advise if you are in agreement and I can send them in to [facility] on [address]/thx dmf

## 2018-05-07 NOTE — Telephone Encounter (Signed)
PEC-I LMOVM stating that both Levothyroxine 75mcg and Trazodone 50mg  are on the $4.00 list at [facility] for 30 days worth or $10.0 for 90 days worth/asked her to RTC to advise what [facility] she would want these sent to so she can resume current therapy/thx dmf

## 2018-05-08 NOTE — Telephone Encounter (Signed)
Yes I am in agreement with all three changes.  Okay to send in as listed above. Thank you.

## 2018-05-11 MED ORDER — LORATADINE 10 MG PO TABS: 10.0000 mg | ORAL_TABLET | Freq: Every day | ORAL | 3 refills | Status: DC

## 2018-05-11 MED ORDER — LEVOTHYROXINE SODIUM 50 MCG PO TABS: 50.0000 ug | ORAL_TABLET | Freq: Every day | ORAL | 3 refills | Status: DC

## 2018-05-11 MED ORDER — TRAZODONE HCL 150 MG PO TABS: 150.0000 mg | ORAL_TABLET | Freq: Every day | ORAL | 3 refills | Status: DC

## 2018-05-11 NOTE — Telephone Encounter (Signed)
Per Ta ok to send in all 3 meds to [facility] [address]/LMOVM stating this/thx dmf

## 2018-06-11 NOTE — Progress Notes (Signed)
HPI: FU atrial fibrillation and TIA. Echocardiogram in January of 2014 showed normal LV function, mild left ventricular hypertrophy, mild aortic insufficiency. A patent foramen ovale could not be excluded. Brain MRI in January 2014 showed no acute infarct, mild small vessel disease. TEE in February of 2014. Her LV function was normal. There was mild mitral valve prolapse with mild mitral regurgitation. There was trace aortic insufficiency. There was no PFO. Monitor in March of 2014 showed paroxysmal atrial fibrillation/flutter. She was therefore started on anticoagulation. Carotid Dopplers February 2018 showed 1-39% bilateral stenosis. Since I last saw she has occasional dizziness but no syncope.  She denies dyspnea, chest pain or bleeding.  Current Outpatient Medications  Medication Sig Dispense Refill  . Alpha-D-Galactosidase (BEANO PO) Take by mouth 2 (two) times daily.    Marland Kitchen atorvastatin (LIPITOR) 20 MG tablet Take 1 tablet (20 mg total) by mouth daily. 90 tablet 3  . Bepotastine Besilate (BEPREVE) 1.5 % SOLN Use one drop in each eye twice a day    . bumetanide (BUMEX) 1 MG tablet Take 1 tablet (1 mg total) by mouth 2 (two) times daily. 180 tablet 1  . buPROPion (WELLBUTRIN XL) 150 MG 24 hr tablet Take 1 tablet (150 mg total) by mouth daily. 90 tablet 1  . Calcium Carbonate-Vitamin D (CALCIUM 600/VITAMIN D) 600-400 MG-UNIT per tablet Take 1 tablet by mouth 2 (two) times daily.      . Cholecalciferol (VITAMIN D-3) 5000 UNITS TABS Take one tablet by mouth three times a week    . clonazePAM (KLONOPIN) 0.5 MG tablet Take 1 tablet (0.5 mg total) by mouth at bedtime as needed for anxiety. 30 tablet 2  . cyclobenzaprine (FLEXERIL) 5 MG tablet Take 0.5 to 1 tablet at bedtime as needed for muscle spasm 14 tablet 0  . denosumab (PROLIA) 60 MG/ML SOLN injection Inject 60 mg into the skin once. Administer in upper arm, thigh, or abdomen 180 mL 0  . diclofenac sodium (VOLTAREN) 1 % GEL Apply 2g to  affected area qid prn 100 g 3  . famotidine (PEPCID AC) 10 MG chewable tablet Chew 1 tablet (10 mg total) by mouth daily. 90 tablet prm  . fexofenadine (ALLEGRA) 60 MG tablet Take 1 tablet (60 mg total) by mouth 2 (two) times daily. (Patient taking differently: Take 90 mg by mouth daily. PATIENT TAKES 180MG  AM) 60 tablet 3  . Fluticasone-Salmeterol (ADVAIR DISKUS) 100-50 MCG/DOSE AEPB Inhale 1 puff into the lungs 2 (two) times daily. 180 each 1  . levocetirizine (XYZAL) 5 MG tablet Take 5 mg by mouth daily.  1  . levothyroxine (SYNTHROID, LEVOTHROID) 50 MCG tablet Take 1 tablet (50 mcg total) by mouth daily. 90 tablet 3  . Melatonin 5 MG TABS Take 2 tablets my mouth at bedtime    . metoprolol succinate (TOPROL-XL) 25 MG 24 hr tablet Take 3 tablets (75 mg total) by mouth daily. 270 tablet 3  . NEXIUM 40 MG capsule Take 1 capsule (40 mg total) by mouth daily. 90 capsule 3  . nystatin (MYCOSTATIN) 100000 UNIT/ML suspension SWISH BY MOUTH 10 ML UP TO 4 TIMES A DAY 240 mL 1  . potassium chloride (K-DUR) 10 MEQ tablet Take 1 tablet (10 mEq total) by mouth 2 (two) times daily. 180 tablet 3  . traZODone (DESYREL) 150 MG tablet Take 1 tablet (150 mg total) by mouth at bedtime. 90 tablet 3  . XARELTO 20 MG TABS tablet TAKE 1 TABLET BY MOUTH  DAILY. 90 tablet 3   No current facility-administered medications for this visit.      Past Medical History:  Diagnosis Date  . Allergy   . Arthritis    hands  . Asthma   . Atrial fibrillation ([place])   . Atrial flutter ([place])   . Depression   . Dysrhythmia    a-fib, on Xarelto  . GERD (gastroesophageal reflux disease)   . Hyperlipidemia   . Hypertension   . Hypothyroidism   . Osteoporosis   . TIA (transient ischemic attack)     Past Surgical History:  Procedure Laterality Date  . ABDOMINAL HYSTERECTOMY    . COLONOSCOPY    . MYRINGOTOMY WITH TUBE PLACEMENT Right 01/14/2016   Procedure: RIGHT MYRINGOTOMY WITH T-TUBE PLACEMENT;  Surgeon: Leta Baptist, MD;   Location: [place];  Service: ENT;  Laterality: Right;  . PARTIAL HYSTERECTOMY  1989  . TEE WITHOUT CARDIOVERSION N/A 12/22/2012   Procedure: TRANSESOPHAGEAL ECHOCARDIOGRAM (TEE);  Surgeon: Lelon Perla, MD;  Location: [facility] ENDOSCOPY;  Service: Cardiovascular;  Laterality: N/A;    Social History   Socioeconomic History  . Marital status: Married    Spouse name: Not on file  . Number of children: 2  . Years of education: Not on file  . Highest education level: Not on file  Occupational History    Employer: RETIRED  Social Needs  . Financial resource strain: Not on file  . Food insecurity:    Worry: Not on file    Inability: Not on file  . Transportation needs:    Medical: Not on file    Non-medical: Not on file  Tobacco Use  . Smoking status: Never Smoker  . Smokeless tobacco: Never Used  Substance and Sexual Activity  . Alcohol use: Yes    Comment: Rare  . Drug use: No  . Sexual activity: Yes  Lifestyle  . Physical activity:    Days per week: Not on file    Minutes per session: Not on file  . Stress: Not on file  Relationships  . Social connections:    Talks on phone: Not on file    Gets together: Not on file    Attends religious service: Not on file    Active member of club or organization: Not on file    Attends meetings of clubs or organizations: Not on file    Relationship status: Not on file  . Intimate partner violence:    Fear of current or ex partner: Not on file    Emotionally abused: Not on file    Physically abused: Not on file    Forced sexual activity: Not on file  Other Topics Concern  . Not on file  Social History Narrative   Does have HPOA- sons Eddie Dibbles and Wille Glaser.   Does desire life support if not futile.   Not sure about feeding tubes.    Family History  Problem Relation Age of Onset  . Breast cancer Mother   . Drug abuse Son   . Depression Son   . Stroke Paternal Aunt   . Heart disease Paternal Grandfather   . Diabetes  Maternal Grandfather     ROS: no fevers or chills, productive cough, hemoptysis, dysphasia, odynophagia, melena, hematochezia, dysuria, hematuria, rash, seizure activity, orthopnea, PND, pedal edema, claudication. Remaining systems are negative.  Physical Exam: Well-developed well-nourished in no acute distress.  Skin is warm and dry.  HEENT is normal.  Neck is supple.  Bilateral carotid  bruits left greater than right Chest is clear to auscultation with normal expansion.  Cardiovascular exam is regular rate and rhythm.  Abdominal exam nontender or distended. No masses palpated. Extremities show no edema. neuro grossly intact  ECG-sinus rhythm with occasional PAC.  Poor R wave progression.  Personally reviewed  A/P  1 paroxysmal atrial fibrillation-patient remains in sinus rhythm.  Continue beta-blocker for rate control if atrial fibrillation recurs.  Continue Xarelto.  2 Hypertension-blood pressure is controlled today.  Continue present medications and follow.  3 carotid artery disease-continue statin.  Mild on most recent Dopplers.  4 hyperlipidemia-continue statin.  Per primary care.  Kirk Ruths, MD

## 2018-06-14 ENCOUNTER — Encounter: Payer: Self-pay | Admitting: Cardiology

## 2018-06-14 ENCOUNTER — Ambulatory Visit (INDEPENDENT_AMBULATORY_CARE_PROVIDER_SITE_OTHER): Payer: Medicare Other | Admitting: Cardiology

## 2018-06-14 VITALS — BP 136/68 | HR 61 | Ht 63.0 in | Wt 132.0 lb

## 2018-06-14 DIAGNOSIS — I10 Essential (primary) hypertension: Secondary | ICD-10-CM | POA: Diagnosis not present

## 2018-06-14 DIAGNOSIS — I480 Paroxysmal atrial fibrillation: Secondary | ICD-10-CM

## 2018-06-14 DIAGNOSIS — E7800 Pure hypercholesterolemia, unspecified: Secondary | ICD-10-CM | POA: Diagnosis not present

## 2018-06-14 NOTE — Patient Instructions (Signed)
Your physician wants you to follow-up in: ONE YEAR WITH DR CRENSHAW You will receive a reminder letter in the mail two months in advance. If you don't receive a letter, please call our office to schedule the follow-up appointment.   If you need a refill on your cardiac medications before your next appointment, please call your pharmacy.  

## 2018-07-26 ENCOUNTER — Ambulatory Visit: Payer: Self-pay | Admitting: Cardiology

## 2018-08-09 ENCOUNTER — Other Ambulatory Visit: Payer: Self-pay | Admitting: Cardiology

## 2018-08-09 ENCOUNTER — Other Ambulatory Visit: Payer: Self-pay | Admitting: Family Medicine

## 2018-08-09 ENCOUNTER — Telehealth: Payer: Self-pay | Admitting: Behavioral Health

## 2018-08-09 DIAGNOSIS — I480 Paroxysmal atrial fibrillation: Secondary | ICD-10-CM

## 2018-08-09 NOTE — Telephone Encounter (Signed)
Received Summary of Benefits for Prolia injection. The estimated out of pocket expense is $0. No PA is required.   Called to inform patient of the above benefits & schedule nurse visit appointment; no answer at the time of call; left v/m for a call back.

## 2018-08-10 ENCOUNTER — Other Ambulatory Visit: Payer: Self-pay | Admitting: Family Medicine

## 2018-08-11 ENCOUNTER — Ambulatory Visit (INDEPENDENT_AMBULATORY_CARE_PROVIDER_SITE_OTHER): Payer: Medicare Other | Admitting: Behavioral Health

## 2018-08-11 DIAGNOSIS — M810 Age-related osteoporosis without current pathological fracture: Secondary | ICD-10-CM | POA: Diagnosis not present

## 2018-08-11 MED ORDER — DENOSUMAB 60 MG/ML ~~LOC~~ SOSY
60.0000 mg | PREFILLED_SYRINGE | Freq: Once | SUBCUTANEOUS | Status: AC
  Administered 2018-08-11: 60 mg via SUBCUTANEOUS

## 2018-08-11 NOTE — Progress Notes (Signed)
Patient came in office today for administration of Prolia injection. SQ injection was given in the left arm. Patient tolerated it well. No s/s of a reaction noted prior to patient leaving the nurse visit.

## 2018-08-12 ENCOUNTER — Telehealth: Payer: Self-pay | Admitting: Family Medicine

## 2018-08-12 NOTE — Telephone Encounter (Signed)
Copied from [place] [id number]. Topic: Quick Communication - See Telephone Encounter >> Aug 12, 2018  4:59 PM [name] wrote: CRM for notification. See Telephone encounter for: 08/12/18.  Pt called in and stated that she needs a refill on her Satanta 40 MG capsule [[id number]] but per her insurance this is requiring a PA to be done on it  She stated that the only ins of hers that has not approved it is her [place] number [facility]

## 2018-08-13 NOTE — Telephone Encounter (Signed)
PA completed 10.11.19. Waiting for response

## 2018-08-18 NOTE — Telephone Encounter (Signed)
PA approved.

## 2018-09-09 ENCOUNTER — Encounter: Payer: Self-pay | Admitting: Family Medicine

## 2018-09-09 ENCOUNTER — Ambulatory Visit (INDEPENDENT_AMBULATORY_CARE_PROVIDER_SITE_OTHER): Payer: Medicare Other | Admitting: Family Medicine

## 2018-09-09 DIAGNOSIS — J069 Acute upper respiratory infection, unspecified: Secondary | ICD-10-CM

## 2018-09-09 MED ORDER — DOXYCYCLINE HYCLATE 100 MG PO TABS: 100.0000 mg | ORAL_TABLET | Freq: Two times a day (BID) | ORAL | 0 refills | Status: DC

## 2018-09-09 NOTE — Assessment & Plan Note (Signed)
  Symptomatic therapy suggested: push fluids, rest and return office visit prn if symptoms persist or worsen. Lack of antibiotic effectiveness discussed with her. Call or return to clinic prn if these symptoms worsen or fail to improve as anticipated.  Rx written for doxycycline that she may take on her trip to start if symptoms are not improving after 7-10 days

## 2018-09-09 NOTE — Progress Notes (Signed)
Patricia Peck - 72 y.o. female MRN [id number]  Date of birth: [birth year]  Subjective Chief Complaint  Patient presents with  . Cough  . Sinus Problem    HPI Patricia Peck is a 72 y.o. female who complains of congestion, sore throat and dry cough for 1 day. She reports that symptoms began last night when she had a burning sensation in throat.  Though initially to be heartburn until she woke up this morning with congestion with post nasal drainage and mild cough.  She is concerned as she is supposed to be traveling to [place] for vacation next week.   She denies a history of chest pain, chills, fevers, nausea, shortness of breath, vomiting, wheezing and sputum production and denies a history of asthma. Patient does not smoke cigarettes.   ROS:  A comprehensive ROS was completed and negative except as noted per HPI   No Known Allergies  Past Medical History:  Diagnosis Date  . Allergy   . Arthritis    hands  . Asthma   . Atrial fibrillation ([place])   . Atrial flutter ([place])   . Depression   . Dysrhythmia    a-fib, on Xarelto  . GERD (gastroesophageal reflux disease)   . Hyperlipidemia   . Hypertension   . Hypothyroidism   . Osteoporosis   . TIA (transient ischemic attack)     Past Surgical History:  Procedure Laterality Date  . ABDOMINAL HYSTERECTOMY    . COLONOSCOPY    . MYRINGOTOMY WITH TUBE PLACEMENT Right 01/14/2016   Procedure: RIGHT MYRINGOTOMY WITH T-TUBE PLACEMENT;  Surgeon: Leta Baptist, MD;  Location: [place];  Service: ENT;  Laterality: Right;  . PARTIAL HYSTERECTOMY  1989  . TEE WITHOUT CARDIOVERSION N/A 12/22/2012   Procedure: TRANSESOPHAGEAL ECHOCARDIOGRAM (TEE);  Surgeon: Lelon Perla, MD;  Location: [facility] ENDOSCOPY;  Service: Cardiovascular;  Laterality: N/A;    Social History   Socioeconomic History  . Marital status: Married    Spouse name: Not on file  . Number of children: 2  . Years of education: Not on file  . Highest education  level: Not on file  Occupational History    Employer: RETIRED  Social Needs  . Financial resource strain: Not on file  . Food insecurity:    Worry: Not on file    Inability: Not on file  . Transportation needs:    Medical: Not on file    Non-medical: Not on file  Tobacco Use  . Smoking status: Never Smoker  . Smokeless tobacco: Never Used  Substance and Sexual Activity  . Alcohol use: Yes    Comment: Rare  . Drug use: No  . Sexual activity: Yes  Lifestyle  . Physical activity:    Days per week: Not on file    Minutes per session: Not on file  . Stress: Not on file  Relationships  . Social connections:    Talks on phone: Not on file    Gets together: Not on file    Attends religious service: Not on file    Active member of club or organization: Not on file    Attends meetings of clubs or organizations: Not on file    Relationship status: Not on file  Other Topics Concern  . Not on file  Social History Narrative   Does have HPOA- sons Eddie Dibbles and Wille Glaser.   Does desire life support if not futile.   Not sure about feeding tubes.    Family  History  Problem Relation Age of Onset  . Breast cancer Mother   . Drug abuse Son   . Depression Son   . Stroke Paternal Aunt   . Heart disease Paternal Grandfather   . Diabetes Maternal Grandfather     Health Maintenance  Topic Date Due  . MAMMOGRAM  03/16/2019  . COLONOSCOPY  07/20/2024  . TETANUS/TDAP  04/16/2025  . INFLUENZA VACCINE  Completed  . DEXA SCAN  Completed  . Hepatitis C Screening  Completed  . PNA vac Low Risk Adult  Completed    ----------------------------------------------------------------------------------------------------------------------------------------------------------------------------------------------------------------- Physical Exam BP 122/70   Pulse 63   Temp 97.6 F (36.4 C) (Oral)   Wt 131 lb 3.2 oz (59.5 kg)   SpO2 97%   BMI 23.24 kg/m   Physical Exam  Constitutional: She is oriented  to person, place, and time. She appears well-nourished. No distress.  HENT:  Head: Normocephalic and atraumatic.  Mouth/Throat: Oropharynx is clear and moist.  No sinus tenderness TM normal bilaterally.   Eyes: No scleral icterus.  Neck: Neck supple. No thyromegaly present.  Cardiovascular: Normal rate, regular rhythm and normal heart sounds.  Pulmonary/Chest: Effort normal and breath sounds normal.  Lymphadenopathy:    She has no cervical adenopathy.  Neurological: She is alert and oriented to person, place, and time.  Skin: Skin is warm and dry.  Psychiatric: She has a normal mood and affect. Her behavior is normal.    ------------------------------------------------------------------------------------------------------------------------------------------------------------------------------------------------------------------- Assessment and Plan  URI (upper respiratory infection)  Symptomatic therapy suggested: push fluids, rest and return office visit prn if symptoms persist or worsen. Lack of antibiotic effectiveness discussed with her. Call or return to clinic prn if these symptoms worsen or fail to improve as anticipated.  Rx written for doxycycline that she may take on her trip to start if symptoms are not improving after 7-10 days

## 2018-09-09 NOTE — Patient Instructions (Signed)
Stay well hydrated Mucinex and nasal saline may be helpful as well.  Hold on to antibiotic and if symptoms are not improving over the next week go ahead and start.  Have a great trip!   Upper Respiratory Infection, Adult Most upper respiratory infections (URIs) are caused by a virus. A URI affects the nose, throat, and upper air passages. The most common type of URI is often called "the common cold." Follow these instructions at home:  Take medicines only as told by your doctor.  Gargle warm saltwater or take cough drops to comfort your throat as told by your doctor.  Use a warm mist humidifier or inhale steam from a shower to increase air moisture. This may make it easier to breathe.  Drink enough fluid to keep your pee (urine) clear or pale yellow.  Eat soups and other clear broths.  Have a healthy diet.  Rest as needed.  Go back to work when your fever is gone or your doctor says it is okay. ? You may need to stay home longer to avoid giving your URI to others. ? You can also wear a face mask and wash your hands often to prevent spread of the virus.  Use your inhaler more if you have asthma.  Do not use any tobacco products, including cigarettes, chewing tobacco, or electronic cigarettes. If you need help quitting, ask your doctor. Contact a doctor if:  You are getting worse, not better.  Your symptoms are not helped by medicine.  You have chills.  You are getting more short of breath.  You have brown or red mucus.  You have yellow or brown discharge from your nose.  You have pain in your face, especially when you bend forward.  You have a fever.  You have puffy (swollen) neck glands.  You have pain while swallowing.  You have white areas in the back of your throat. Get help right away if:  You have very bad or constant: ? Headache. ? Ear pain. ? Pain in your forehead, behind your eyes, and over your cheekbones (sinus pain). ? Chest pain.  You have  long-lasting (chronic) lung disease and any of the following: ? Wheezing. ? Long-lasting cough. ? Coughing up blood. ? A change in your usual mucus.  You have a stiff neck.  You have changes in your: ? Vision. ? Hearing. ? Thinking. ? Mood. This information is not intended to replace advice given to you by your health care provider. Make sure you discuss any questions you have with your health care provider. Document Released: 04/07/2008 Document Revised: 06/22/2016 Document Reviewed: 01/25/2014 Elsevier Interactive Patient Education  2018 [employer].

## 2018-10-07 ENCOUNTER — Other Ambulatory Visit: Payer: Self-pay | Admitting: *Deleted

## 2018-10-07 MED ORDER — LEVOTHYROXINE SODIUM 50 MCG PO TABS: 50.0000 ug | ORAL_TABLET | Freq: Every day | ORAL | 3 refills | Status: DC

## 2018-10-07 MED ORDER — FLUTICASONE-SALMETEROL 100-50 MCG/DOSE IN AEPB: 1.0000 | INHALATION_SPRAY | Freq: Two times a day (BID) | RESPIRATORY_TRACT | 1 refills | Status: DC

## 2018-10-07 MED ORDER — BUMETANIDE 1 MG PO TABS: 1.0000 mg | ORAL_TABLET | Freq: Two times a day (BID) | ORAL | 1 refills | Status: DC

## 2018-10-07 NOTE — Addendum Note (Signed)
Addended by: Verlene Mayer A on: 10/07/2018 03:23 PM   Modules accepted: Orders

## 2018-10-28 ENCOUNTER — Other Ambulatory Visit: Payer: Self-pay | Admitting: Family Medicine

## 2018-10-29 NOTE — Telephone Encounter (Signed)
For some reason, says rx is locked.  I tried to send it in.  Okay to call it in as requested.

## 2018-10-29 NOTE — Telephone Encounter (Signed)
Rx phoned in as directed above. Unable to edit in chart due to Rx being locked.

## 2018-11-24 ENCOUNTER — Other Ambulatory Visit: Payer: Self-pay | Admitting: Family Medicine

## 2019-02-11 ENCOUNTER — Other Ambulatory Visit: Payer: Self-pay | Admitting: Cardiology

## 2019-02-11 ENCOUNTER — Other Ambulatory Visit: Payer: Self-pay | Admitting: Family Medicine

## 2019-02-11 NOTE — Telephone Encounter (Signed)
73 yo, 131lbs, Scr 0.95 on 04/21/18, crcl 34ml/min Last OV 06/14/18 Indication afib Crcl is borderline for dose reduction - given history of TIA will continue higher dose until labs can be rechecked after COVID.

## 2019-02-15 ENCOUNTER — Telehealth: Payer: Self-pay | Admitting: Behavioral Health

## 2019-02-15 NOTE — Telephone Encounter (Signed)
Received Summary of Benefits for Prolia injection. No PA is required. The estimated out of pocket expense is $0. Medication has been ordered from pharmacy.  Patient was made aware of the above info and voiced understanding. Nurse visit appointment has been scheduled for 02/17/2019 at 10:40 AM.

## 2019-02-17 ENCOUNTER — Ambulatory Visit (INDEPENDENT_AMBULATORY_CARE_PROVIDER_SITE_OTHER): Payer: Medicare Other | Admitting: Behavioral Health

## 2019-02-17 DIAGNOSIS — M810 Age-related osteoporosis without current pathological fracture: Secondary | ICD-10-CM | POA: Diagnosis not present

## 2019-02-17 MED ORDER — DENOSUMAB 60 MG/ML ~~LOC~~ SOSY
60.0000 mg | PREFILLED_SYRINGE | Freq: Once | SUBCUTANEOUS | Status: AC
  Administered 2019-02-17: 11:00:00 60 mg via SUBCUTANEOUS

## 2019-02-17 NOTE — Progress Notes (Signed)
Patient presents in clinic today for Prolia injection. SQ injection was given in the left arm. Patient tolerated the injection well. No signs or symptoms of a reaction were noted prior to patient leaving the nurse visit.

## 2019-02-17 NOTE — Progress Notes (Signed)
Agree with treatment as documented below.

## 2019-04-27 ENCOUNTER — Ambulatory Visit: Payer: Self-pay | Admitting: *Deleted

## 2019-04-28 LAB — HM MAMMOGRAPHY

## 2019-05-03 ENCOUNTER — Encounter: Payer: Self-pay | Admitting: Family Medicine

## 2019-05-03 NOTE — Progress Notes (Signed)
[facility]/thx dmf 

## 2019-05-09 ENCOUNTER — Telehealth: Payer: Self-pay | Admitting: Cardiology

## 2019-05-09 NOTE — Telephone Encounter (Signed)
New message   Patient wants to know if she should have Carotid Artery scan before her f/u visit. Please call.

## 2019-05-09 NOTE — Telephone Encounter (Signed)
No documentation in chart for f/u carotid Appt scheduled,  Will arrive 820am    Primary Cardiologist: Kirk Ruths, MD   Pt contacted.  History and symptoms reviewed.  Pt will f/u with [facility] provider as scheduled.  Pt. advised that we are restricting visitors at this time and request that only patients present for check-in prior to their appointment.  All other visitors should remain in their car.  If necessary, only one visitor may come with the patient, into the building. For everyone's safety, all patients and visitor entering our practice area should expect to be screened again prior to entering our waiting area.  Waylan Rocher, LPN  03/04/801 23:36 AM

## 2019-05-10 ENCOUNTER — Telehealth: Payer: Self-pay

## 2019-05-10 NOTE — Telephone Encounter (Signed)
Will call pt at 1:25 to discuss

## 2019-05-10 NOTE — Telephone Encounter (Signed)
Copied from [place] [id number]. Topic: General - Other >> May 10, 2019 10:01 AM Yvette Rack wrote: Reason for CRM: Pt stated she viewed her mammogram results on [facility] and she is concerned and has some questions. Pt stated she would like a call back to discuss the mammogram results. Pt stated she will be available until 1:25 pm today. Pt requests call back.

## 2019-05-10 NOTE — Telephone Encounter (Signed)
Spoke with pt. Pt aware of mammo results.

## 2019-05-17 ENCOUNTER — Telehealth: Payer: Self-pay

## 2019-05-17 NOTE — Progress Notes (Signed)
Virtual Visit via Video Note  I connected with patient on 05/18/19 at  9:00 AM EDT by audio enabled telemedicine application and verified that I am speaking with the correct person using two identifiers.   THIS ENCOUNTER IS A VIRTUAL VISIT DUE TO COVID-19 - PATIENT WAS NOT SEEN IN THE OFFICE. PATIENT HAS CONSENTED TO VIRTUAL VISIT / TELEMEDICINE VISIT   Location of patient: home  Location of provider: office  I discussed the limitations of evaluation and management by telemedicine and the availability of in person appointments. The patient expressed understanding and agreed to proceed.   Subjective:   Patricia Peck is a 73 y.o. female who presents for Medicare Annual (Subsequent) preventive examination.  Review of Systems: No ROS.  Medicare Wellness Virtual Visit.  Visual/audio telehealth visit, UTA vital signs.   See social history for additional risk factors. Cardiac Risk Factors include: advanced age (>36men, >33 women);hypertension Sleep patterns: Takes Trazodone at night and states she still has trouble sleeping. Has appt to discuss with PCP 05/23/19 Home Safety/Smoke Alarms: Feels safe in home. Smoke alarms in place.  Lives with husband in 2 story home. Master on 1st floor.  Son living w/ them.   Female:         Mammo- 04/28/19       Dexa scan- Pt states she would like to discuss w/ PCP prior to ordering       CCS- No longer doing routine screening due to age.     Objective:    Advanced Directives 05/18/2019 04/21/2018 04/21/2017 01/14/2016 01/08/2016 12/22/2012  Does Patient Have a Medical Advance Directive? Yes Yes Yes Yes Yes Patient has advance directive, copy not in chart  Type of Advance Directive [place];Living will [place];Living will [place];Living will [place];Living will [place];Living will [place];Living will  Does patient want to make changes  to medical advance directive? No - Patient declined - - No - Patient declined - -  Copy of [place] in Chart? No - copy requested No - copy requested No - copy requested No - copy requested - -  Pre-existing out of facility DNR order (yellow form or pink MOST form) - - - - - No    Tobacco Social History   Tobacco Use  Smoking Status Never Smoker  Smokeless Tobacco Never Used     Counseling given: Not Answered   Clinical Intake:     Pain : No/denies pain                 Past Medical History:  Diagnosis Date  . Allergy   . Arthritis    hands  . Asthma   . Atrial fibrillation ([place])   . Atrial flutter ([place])   . Depression   . Dysrhythmia    a-fib, on Xarelto  . GERD (gastroesophageal reflux disease)   . Hyperlipidemia   . Hypertension   . Hypothyroidism   . Osteoporosis   . TIA (transient ischemic attack)    Past Surgical History:  Procedure Laterality Date  . ABDOMINAL HYSTERECTOMY    . COLONOSCOPY    . MYRINGOTOMY WITH TUBE PLACEMENT Right 01/14/2016   Procedure: RIGHT MYRINGOTOMY WITH T-TUBE PLACEMENT;  Surgeon: Leta Baptist, MD;  Location: [place];  Service: ENT;  Laterality: Right;  . PARTIAL HYSTERECTOMY  1989  . TEE WITHOUT CARDIOVERSION N/A 12/22/2012   Procedure: TRANSESOPHAGEAL ECHOCARDIOGRAM (TEE);  Surgeon: Lelon Perla, MD;  Location: [facility] ENDOSCOPY;  Service: Cardiovascular;  Laterality: N/A;   Family History  Problem Relation Age of Onset  . Breast cancer Mother   . Drug abuse Son   . Depression Son   . Stroke Paternal Aunt   . Heart disease Paternal Grandfather   . Diabetes Maternal Grandfather    Social History   Socioeconomic History  . Marital status: Married    Spouse name: Not on file  . Number of children: 2  . Years of education: Not on file  . Highest education level: Not on file  Occupational History    Employer: RETIRED  Social Needs  . Financial resource strain: Not on file  . Food  insecurity    Worry: Not on file    Inability: Not on file  . Transportation needs    Medical: Not on file    Non-medical: Not on file  Tobacco Use  . Smoking status: Never Smoker  . Smokeless tobacco: Never Used  Substance and Sexual Activity  . Alcohol use: Yes    Comment: Rare  . Drug use: No  . Sexual activity: Yes  Lifestyle  . Physical activity    Days per week: Not on file    Minutes per session: Not on file  . Stress: Not on file  Relationships  . Social Herbalist on phone: Not on file    Gets together: Not on file    Attends religious service: Not on file    Active member of club or organization: Not on file    Attends meetings of clubs or organizations: Not on file    Relationship status: Not on file  Other Topics Concern  . Not on file  Social History Narrative   Does have HPOA- sons Eddie Dibbles and Wille Glaser.   Does desire life support if not futile.   Not sure about feeding tubes.    Outpatient Encounter Medications as of 05/18/2019  Medication Sig  . Alpha-D-Galactosidase (BEANO PO) Take by mouth 2 (two) times daily.  Marland Kitchen atorvastatin (LIPITOR) 20 MG tablet TAKE 1 TABLET BY MOUTH DAILY.  Marland Kitchen Bepotastine Besilate (BEPREVE) 1.5 % SOLN Use one drop in each eye twice a day  . bumetanide (BUMEX) 1 MG tablet TAKE 1 TABLET BY MOUTH 2 TIMES DAILY.  Marland Kitchen buPROPion (WELLBUTRIN XL) 150 MG 24 hr tablet TAKE 1 TABLET BY MOUTH DAILY.  . Calcium Carbonate-Vitamin D (CALCIUM 600/VITAMIN D) 600-400 MG-UNIT per tablet Take 1 tablet by mouth 2 (two) times daily.    . Cholecalciferol (VITAMIN D-3) 5000 UNITS TABS Take one tablet by mouth three times a week  . clonazePAM (KLONOPIN) 0.5 MG tablet TAKE 1 TABLET BY MOUTH AT BEDTIME AS NEEDED FOR ANXIETY  . cyclobenzaprine (FLEXERIL) 5 MG tablet Take 0.5 to 1 tablet at bedtime as needed for muscle spasm  . denosumab (PROLIA) 60 MG/ML SOLN injection Inject 60 mg into the skin once. Administer in upper arm, thigh, or abdomen  . diclofenac  sodium (VOLTAREN) 1 % GEL Apply 2g to affected area qid prn  . famotidine (PEPCID AC) 10 MG chewable tablet Chew 1 tablet (10 mg total) by mouth daily.  . fexofenadine (ALLEGRA) 60 MG tablet Take 1 tablet (60 mg total) by mouth 2 (two) times daily. (Patient taking differently: Take 90 mg by mouth daily. PATIENT TAKES 180MG  AM)  . levocetirizine (XYZAL) 5 MG tablet Take 5 mg by mouth daily.  Marland Kitchen levothyroxine (SYNTHROID, LEVOTHROID)  50 MCG tablet TAKE 1 TABLET BY MOUTH DAILY.  . Melatonin 5 MG TABS Take 2 tablets my mouth at bedtime  . metoprolol succinate (TOPROL-XL) 25 MG 24 hr tablet TAKE 3 TABLETS BY MOUTH DAILY.  Marland Kitchen NEXIUM 40 MG capsule TAKE 1 CAPSULE BY MOUTH DAILY.  Marland Kitchen nystatin (MYCOSTATIN) 100000 UNIT/ML suspension SWISH BY MOUTH 10 ML UP TO 4 TIMES A DAY  . potassium chloride (K-DUR) 10 MEQ tablet TAKE 1 TABLET BY MOUTH 2 TIMES DAILY.  . traZODone (DESYREL) 50 MG tablet TAKE 3 TABLETS BY MOUTH AT BEDTIME.  Marland Kitchen XARELTO 20 MG TABS tablet TAKE 1 TABLET BY MOUTH DAILY.  Marland Kitchen Fluticasone-Salmeterol (ADVAIR DISKUS) 100-50 MCG/DOSE AEPB Inhale 1 puff into the lungs 2 (two) times daily.  . [DISCONTINUED] doxycycline (VIBRA-TABS) 100 MG tablet Take 1 tablet (100 mg total) by mouth 2 (two) times daily. (Patient not taking: Reported on 05/18/2019)   No facility-administered encounter medications on file as of 05/18/2019.     Activities of Daily Living In your present state of health, do you have any difficulty performing the following activities: 05/18/2019  Hearing? N  Vision? N  Difficulty concentrating or making decisions? N  Walking or climbing stairs? N  Dressing or bathing? N  Doing errands, shopping? N  Preparing Food and eating ? N  Using the Toilet? N  In the past six months, have you accidently leaked urine? N  Do you have problems with loss of bowel control? N  Managing your Medications? N  Managing your Finances? N  Housekeeping or managing your Housekeeping? N  Some recent data might  be hidden    Patient Care Team: Lucille Passy, MD as PCP - General Stanford Breed Denice Bors, MD as PCP - Cardiology (Cardiology) Stanford Breed Denice Bors, MD as Consulting Physician (Cardiology) Leta Baptist, MD as Consulting Physician (Otolaryngology) Mosetta Anis, MD as Referring Physician (Allergy)    Assessment:   This is a routine wellness examination for Hasana. Physical assessment deferred to PCP.  Exercise Activities and Dietary recommendations Current Exercise Habits: Home exercise routine, Type of exercise: treadmill, Time (Minutes): 20, Frequency (Times/Week): 5, Weekly Exercise (Minutes/Week): 100, Intensity: Mild, Exercise limited by: None identified Diet (meal preparation, eat out, water intake, caffeinated beverages, dairy products, fruits and vegetables): well balanced, on average, 3 meals per day  Goals    . Increase physical activity     Starting 04/21/2017, I will continue to golf for at least 2.5 hours per week.     . Increase physical activity       Fall Risk Fall Risk  05/18/2019 04/21/2018 08/05/2017 07/29/2017 07/23/2017  Falls in the past year? 0 No No Yes No  Number falls in past yr: - - - 1 -  Injury with Fall? - - - No -    Depression Screen PHQ 2/9 Scores 05/18/2019 04/26/2018 04/21/2018 12/16/2017  PHQ - 2 Score 0 0 0 3  PHQ- 9 Score - 1 - 18     Cognitive Function Ad8 score reviewed for issues:  Issues making decisions:no  Less interest in hobbies / activities:no  Repeats questions, stories (family complaining):no  Trouble using ordinary gadgets (microwave, computer, phone):no  Forgets the month or year: no  Mismanaging finances: no  Remembering appts:no  Daily problems with thinking and/or memory:no Ad8 score is=0   MMSE - Mini Mental State Exam 04/21/2018 04/21/2017  Orientation to time 5 5  Orientation to Place 5 5  Registration 3 3  Attention/ Calculation 5 0  Recall 3 1  Recall-comments - pt was unable to recall 2 of 3 words  Language- name 2  objects 2 0  Language- repeat 1 1  Language- follow 3 step command 3 3  Language- read & follow direction 1 0  Write a sentence 1 0  Copy design 1 0  Total score 30 18        Immunization History  Administered Date(s) Administered  . Influenza Split 09/04/2011, 08/11/2012, 08/04/2017  . Influenza Whole 08/07/2009  . Influenza, High Dose Seasonal PF 08/28/2014  . Influenza,inj,Quad PF,6+ Mos 08/24/2013, 07/20/2015, 07/29/2016, 08/04/2017  . Influenza-Unspecified 07/10/2018  . Pneumococcal Conjugate-13 04/12/2014  . Pneumococcal Polysaccharide-23 02/05/2006, 04/08/2012  . Td 01/29/2005, 04/17/2015  . Zoster 04/16/2007  . Zoster Recombinat (Shingrix) 02/09/2018    Screening Tests Health Maintenance  Topic Date Due  . INFLUENZA VACCINE  06/04/2019  . MAMMOGRAM  04/27/2020  . COLONOSCOPY  07/20/2024  . TETANUS/TDAP  04/16/2025  . DEXA SCAN  Completed  . Hepatitis C Screening  Completed  . PNA vac Low Risk Adult  Completed       Plan:   See you next year!  Continue to eat heart healthy diet (full of fruits, vegetables, whole grains, lean protein, water--limit salt, fat, and sugar intake) and increase physical activity as tolerated.  Continue doing brain stimulating activities (puzzles, reading, adult coloring books, staying active) to keep memory sharp.   Bring a copy of your living will and/or healthcare power of attorney to your next office visit.  Discuss bone density scan with PCP.    I have personally reviewed and noted the following in the patient's chart:   . Medical and social history . Use of alcohol, tobacco or illicit drugs  . Current medications and supplements . Functional ability and status . Nutritional status . Physical activity . Advanced directives . List of other physicians . Hospitalizations, surgeries, and ER visits in previous 12 months . Vitals . Screenings to include cognitive, depression, and falls . Referrals and appointments  In  addition, I have reviewed and discussed with patient certain preventive protocols, quality metrics, and best practice recommendations. A written personalized care plan for preventive services as well as general preventive health recommendations were provided to patient.     Shela Nevin, [place]  05/18/2019

## 2019-05-17 NOTE — Telephone Encounter (Signed)
Pt advised Patricia Peck that she was needing an appt with TA to discuss insomnia etc/I LMOVM stating to pt that the first available I have is on Thursday at 8:40am/I put her in that slot and asked that she call to reschedule if this is not fitting for her/thx dmf

## 2019-05-18 ENCOUNTER — Ambulatory Visit (INDEPENDENT_AMBULATORY_CARE_PROVIDER_SITE_OTHER): Payer: Medicare Other | Admitting: *Deleted

## 2019-05-18 ENCOUNTER — Encounter: Payer: Self-pay | Admitting: *Deleted

## 2019-05-18 DIAGNOSIS — Z0000 Encounter for general adult medical examination without abnormal findings: Secondary | ICD-10-CM | POA: Diagnosis not present

## 2019-05-18 NOTE — Patient Instructions (Addendum)
See you next year!  Continue to eat heart healthy diet (full of fruits, vegetables, whole grains, lean protein, water--limit salt, fat, and sugar intake) and increase physical activity as tolerated.  Continue doing brain stimulating activities (puzzles, reading, adult coloring books, staying active) to keep memory sharp.   Bring a copy of your living will and/or healthcare power of attorney to your next office visit.  Discuss bone density scan with PCP.    [name] , Thank you for taking time to come for your Medicare Wellness Visit. I appreciate your ongoing commitment to your health goals. Please review the following plan we discussed and let me know if I can assist you in the future.   These are the goals we discussed: Goals    . Increase physical activity     Starting 04/21/2017, I will continue to golf for at least 2.5 hours per week.     . Increase physical activity       This is a list of the screening recommended for you and due dates:  Health Maintenance  Topic Date Due  . Flu Shot  06/04/2019  . Mammogram  04/27/2020  . Colon Cancer Screening  07/20/2024  . Tetanus Vaccine  04/16/2025  . DEXA scan (bone density measurement)  Completed  .  Hepatitis C: One time screening is recommended by Center for Disease Control  (CDC) for  adults born from [birth year] through 1966.   Completed  . Pneumonia vaccines  Completed    Health Maintenance After Age 64 After age 83, you are at a higher risk for certain long-term diseases and infections as well as injuries from falls. Falls are a major cause of broken bones and head injuries in people who are older than age 42. Getting regular preventive care can help to keep you healthy and well. Preventive care includes getting regular testing and making lifestyle changes as recommended by your health care provider. Talk with your health care provider about:  Which screenings and tests you should have. A screening is a test that checks for a  disease when you have no symptoms.  A diet and exercise plan that is right for you. What should I know about screenings and tests to prevent falls? Screening and testing are the best ways to find a health problem early. Early diagnosis and treatment give you the best chance of managing medical conditions that are common after age 23. Certain conditions and lifestyle choices may make you more likely to have a fall. Your health care provider may recommend:  Regular vision checks. Poor vision and conditions such as cataracts can make you more likely to have a fall. If you wear glasses, make sure to get your prescription updated if your vision changes.  Medicine review. Work with your health care provider to regularly review all of the medicines you are taking, including over-the-counter medicines. Ask your health care provider about any side effects that may make you more likely to have a fall. Tell your health care provider if any medicines that you take make you feel dizzy or sleepy.  Osteoporosis screening. Osteoporosis is a condition that causes the bones to get weaker. This can make the bones weak and cause them to break more easily.  Blood pressure screening. Blood pressure changes and medicines to control blood pressure can make you feel dizzy.  Strength and balance checks. Your health care provider may recommend certain tests to check your strength and balance while standing, walking, or changing positions.  Foot health exam. Foot pain and numbness, as well as not wearing proper footwear, can make you more likely to have a fall.  Depression screening. You may be more likely to have a fall if you have a fear of falling, feel emotionally low, or feel unable to do activities that you used to do.  Alcohol use screening. Using too much alcohol can affect your balance and may make you more likely to have a fall. What actions can I take to lower my risk of falls? General instructions  Talk with  your health care provider about your risks for falling. Tell your health care provider if: ? You fall. Be sure to tell your health care provider about all falls, even ones that seem minor. ? You feel dizzy, sleepy, or off-balance.  Take over-the-counter and prescription medicines only as told by your health care provider. These include any supplements.  Eat a healthy diet and maintain a healthy weight. A healthy diet includes low-fat dairy products, low-fat (lean) meats, and fiber from whole grains, beans, and lots of fruits and vegetables. Home safety  Remove any tripping hazards, such as rugs, cords, and clutter.  Install safety equipment such as grab bars in bathrooms and safety rails on stairs.  Keep rooms and walkways well-lit. Activity   Follow a regular exercise program to stay fit. This will help you maintain your balance. Ask your health care provider what types of exercise are appropriate for you.  If you need a cane or walker, use it as recommended by your health care provider.  Wear supportive shoes that have nonskid soles. Lifestyle  Do not drink alcohol if your health care provider tells you not to drink.  If you drink alcohol, limit how much you have: ? 0-1 drink a day for women. ? 0-2 drinks a day for men.  Be aware of how much alcohol is in your drink. In the [place] one drink equals one typical bottle of beer (12 oz), one-half glass of wine (5 oz), or one shot of hard liquor (1 oz).  Do not use any products that contain nicotine or tobacco, such as cigarettes and e-cigarettes. If you need help quitting, ask your health care provider. Summary  Having a healthy lifestyle and getting preventive care can help to protect your health and wellness after age 69.  Screening and testing are the best way to find a health problem early and help you avoid having a fall. Early diagnosis and treatment give you the best chance for managing medical conditions that are more common  for people who are older than age 21.  Falls are a major cause of broken bones and head injuries in people who are older than age 39. Take precautions to prevent a fall at home.  Work with your health care provider to learn what changes you can make to improve your health and wellness and to prevent falls. This information is not intended to replace advice given to you by your health care provider. Make sure you discuss any questions you have with your health care provider. Document Released: 09/02/2017 Document Revised: 02/10/2019 Document Reviewed: 09/02/2017 Elsevier Patient Education  2020 [employer].

## 2019-05-19 ENCOUNTER — Ambulatory Visit: Payer: Medicare Other | Admitting: Family Medicine

## 2019-05-20 ENCOUNTER — Other Ambulatory Visit: Payer: Self-pay | Admitting: Family Medicine

## 2019-05-23 ENCOUNTER — Telehealth: Payer: Self-pay

## 2019-05-23 ENCOUNTER — Ambulatory Visit (INDEPENDENT_AMBULATORY_CARE_PROVIDER_SITE_OTHER): Payer: Medicare Other | Admitting: Family Medicine

## 2019-05-23 ENCOUNTER — Other Ambulatory Visit: Payer: Self-pay

## 2019-05-23 ENCOUNTER — Encounter: Payer: Self-pay | Admitting: Family Medicine

## 2019-05-23 VITALS — BP 134/88 | HR 96 | Temp 97.7°F | Ht 63.0 in | Wt 128.4 lb

## 2019-05-23 DIAGNOSIS — F418 Other specified anxiety disorders: Secondary | ICD-10-CM | POA: Diagnosis not present

## 2019-05-23 DIAGNOSIS — I10 Essential (primary) hypertension: Secondary | ICD-10-CM | POA: Diagnosis not present

## 2019-05-23 DIAGNOSIS — Z1231 Encounter for screening mammogram for malignant neoplasm of breast: Secondary | ICD-10-CM

## 2019-05-23 DIAGNOSIS — G4709 Other insomnia: Secondary | ICD-10-CM | POA: Diagnosis not present

## 2019-05-23 DIAGNOSIS — E2839 Other primary ovarian failure: Secondary | ICD-10-CM

## 2019-05-23 DIAGNOSIS — I480 Paroxysmal atrial fibrillation: Secondary | ICD-10-CM

## 2019-05-23 MED ORDER — METOPROLOL SUCCINATE ER 25 MG PO TB24: 75.0000 mg | ORAL_TABLET | Freq: Every day | ORAL | 1 refills | Status: DC

## 2019-05-23 MED ORDER — FLUTICASONE-SALMETEROL 100-50 MCG/DOSE IN AEPB: 1.0000 | INHALATION_SPRAY | Freq: Two times a day (BID) | RESPIRATORY_TRACT | 1 refills | Status: DC

## 2019-05-23 MED ORDER — ATORVASTATIN CALCIUM 20 MG PO TABS: 20.0000 mg | ORAL_TABLET | Freq: Every day | ORAL | 1 refills | Status: DC

## 2019-05-23 MED ORDER — BUMETANIDE 1 MG PO TABS: 1.0000 mg | ORAL_TABLET | Freq: Two times a day (BID) | ORAL | 1 refills | Status: DC

## 2019-05-23 MED ORDER — LEVOTHYROXINE SODIUM 50 MCG PO TABS: 50.0000 ug | ORAL_TABLET | Freq: Every day | ORAL | 1 refills | Status: DC

## 2019-05-23 MED ORDER — BELSOMRA 10 MG PO TABS: 1.0000 | ORAL_TABLET | Freq: Every day | ORAL | 3 refills | Status: DC

## 2019-05-23 NOTE — Telephone Encounter (Signed)
RB-Pt was here today to see TA/she was questioning about the Prolia injection schedule/I see that last given in April/I advised her that you would contact her when it is close to the time/thx dmf

## 2019-05-23 NOTE — Patient Instructions (Addendum)
Great to see you. Happy birthday- celebrate for my mom too.  Give Joe my love.  1.  Weaning off trazodone- start off by taking 4 tablet of trazodone (200 mg nightly) every other night for a week, then 3 tablets of trazodone (150 mg nightly) every other night for a week, then 2 tablets ( 100 mg nightly) every other night for one week,  Then 1 tablet (50 mg ) every other night for 1 week, then 1/2 tablet (25 mg) every other night for week and stop.  2.  Start Belsoma 10 mg nightly this evening, if you are not asleep within 45 minutes, okay to take 200 mg of trazodone..  3. Take a week or two off of melatonin.  Please update me as soon as you want to- if two days from now, you have concerns, contact me as soon as possible.  Update me again in 1-2 weeks, then we will recheck your anxiety levels.  Please call the breast center at [phone] to schedule your mammogram and bone density ( 04/30/2020).  Call me on cell on phone [phone]

## 2019-05-23 NOTE — Progress Notes (Signed)
Subjective:   Patient ID: Patricia Peck, female    DOB: [birth year], 73 y.o.   MRN: [id number]  ILEY Peck is a pleasant 73 y.o. year old female who presents to clinic today with Insomnia (Pt screened at vehicle. She is here today C/O issues with insomnia. She had AWV with Angel on 7.15.20. Pt is having to take up to 5-6qhs just to sleep and she is only getting a couple of hours.  She states "I need some rest." She also tried Melatonin 20mg  total & Clonazepam but it is also ineffective.) and Osteoporosis (Pt is needing to have a BMD done.  Last was 2017. Does "NOT" want to go back to [place] but wants to go somewhere in [place]. )  on 05/23/2019  HPI:  Insomnia-- persistent issue for he over the years, intermittently. Currently was supposed to be taking Melatonin and trazodone 150 mg qhs.  But she has been taking trazodone up to 300 mg qhs ( she increased dosage on her own) and melatonin 20 mg.  Clonazepam also ineffective. Has been taking this dose of trazodone for almost a year but I was unaware.    She is not falling or staying asleep.  Anxiety level is quite high- worried about her husband, Joe's declining health.  Depression screen [facility] 2/9 05/23/2019 05/18/2019 04/26/2018 04/21/2018 12/16/2017  Decreased Interest 0 0 0 0 1  Down, Depressed, Hopeless 0 0 0 0 2  PHQ - 2 Score 0 0 0 0 3  Altered sleeping - - 1 - 3  Tired, decreased energy - - 0 - 3  Change in appetite - - 0 - 2  Feeling bad or failure about yourself  - - 0 - 3  Trouble concentrating - - 0 - 1  Moving slowly or fidgety/restless - - 0 - 3  Suicidal thoughts - - 0 - 0  PHQ-9 Score - - 1 - 18  Difficult doing work/chores - - Not difficult at all - Very difficult  Some recent data might be hidden   GAD 7 : Generalized Anxiety Score 05/23/2019 04/26/2018  Nervous, Anxious, on Edge 3 0  Control/stop worrying 3 0  Worry too much - different things 3 1  Trouble relaxing 3 0  Restless 2 0  Easily annoyed or irritable 3 0   Afraid - awful might happen 0 0  Total GAD 7 Score 17 1  Anxiety Difficulty Not difficult at all Not difficult at all      Lab Results  Component Value Date   TSH 1.44 04/21/2018   Lab Results  Component Value Date   CREATININE 0.95 04/21/2018      Current Outpatient Medications on File Prior to Visit  Medication Sig Dispense Refill  . Alpha-D-Galactosidase (BEANO PO) Take by mouth 2 (two) times daily.    . Bepotastine Besilate (BEPREVE) 1.5 % SOLN Use one drop in each eye twice a day    . Calcium Carbonate-Vitamin D (CALCIUM 600/VITAMIN D) 600-400 MG-UNIT per tablet Take 1 tablet by mouth 2 (two) times daily.      . Cholecalciferol (VITAMIN D-3) 5000 UNITS TABS Take one tablet by mouth three times a week    . clonazePAM (KLONOPIN) 0.5 MG tablet TAKE 1 TABLET BY MOUTH AT BEDTIME AS NEEDED FOR ANXIETY 30 tablet 2  . denosumab (PROLIA) 60 MG/ML SOLN injection Inject 60 mg into the skin once. Administer in upper arm, thigh, or abdomen 180 mL 0  . diclofenac sodium (VOLTAREN)  1 % GEL Apply 2g to affected area qid prn 100 g 3  . famotidine (PEPCID AC) 10 MG chewable tablet Chew 1 tablet (10 mg total) by mouth daily. 90 tablet prm  . levocetirizine (XYZAL) 5 MG tablet Take 5 mg by mouth daily.  1  . Melatonin 5 MG TABS Take 2 tablets my mouth at bedtime (has taken 20mg )    . nystatin (MYCOSTATIN) 100000 UNIT/ML suspension SWISH BY MOUTH 10 ML UP TO 4 TIMES A DAY 240 mL 1  . traZODone (DESYREL) 50 MG tablet TAKE 3 TABLETS BY MOUTH AT BEDTIME. (Patient taking differently: Takes 5-6qhs) 270 tablet 0  . XARELTO 20 MG TABS tablet TAKE 1 TABLET BY MOUTH DAILY. 90 tablet 1  . buPROPion (WELLBUTRIN XL) 150 MG 24 hr tablet TAKE 1 TABLET BY MOUTH DAILY. 90 tablet 1  . cyclobenzaprine (FLEXERIL) 5 MG tablet Take 0.5 to 1 tablet at bedtime as needed for muscle spasm (Patient not taking: Reported on 05/23/2019) 14 tablet 0  . NEXIUM 40 MG capsule TAKE 1 CAPSULE BY MOUTH DAILY. 90 capsule 1  .  potassium chloride (K-DUR) 10 MEQ tablet TAKE 1 TABLET BY MOUTH 2 TIMES DAILY. 180 tablet 1   No current facility-administered medications on file prior to visit.     No Known Allergies  Past Medical History:  Diagnosis Date  . Allergy   . Arthritis    hands  . Asthma   . Atrial fibrillation ([place])   . Atrial flutter ([place])   . Depression   . Dysrhythmia    a-fib, on Xarelto  . GERD (gastroesophageal reflux disease)   . Hyperlipidemia   . Hypertension   . Hypothyroidism   . Osteoporosis   . TIA (transient ischemic attack)     Past Surgical History:  Procedure Laterality Date  . ABDOMINAL HYSTERECTOMY    . COLONOSCOPY    . MYRINGOTOMY WITH TUBE PLACEMENT Right 01/14/2016   Procedure: RIGHT MYRINGOTOMY WITH T-TUBE PLACEMENT;  Surgeon: Leta Baptist, MD;  Location: [place];  Service: ENT;  Laterality: Right;  . PARTIAL HYSTERECTOMY  1989  . TEE WITHOUT CARDIOVERSION N/A 12/22/2012   Procedure: TRANSESOPHAGEAL ECHOCARDIOGRAM (TEE);  Surgeon: Lelon Perla, MD;  Location: [facility] ENDOSCOPY;  Service: Cardiovascular;  Laterality: N/A;    Family History  Problem Relation Age of Onset  . Breast cancer Mother   . Drug abuse Son   . Depression Son   . Stroke Paternal Aunt   . Heart disease Paternal Grandfather   . Diabetes Maternal Grandfather     Social History   Socioeconomic History  . Marital status: Married    Spouse name: Not on file  . Number of children: 2  . Years of education: Not on file  . Highest education level: Not on file  Occupational History    Employer: RETIRED  Social Needs  . Financial resource strain: Not on file  . Food insecurity    Worry: Not on file    Inability: Not on file  . Transportation needs    Medical: Not on file    Non-medical: Not on file  Tobacco Use  . Smoking status: Never Smoker  . Smokeless tobacco: Never Used  Substance and Sexual Activity  . Alcohol use: Yes    Comment: Rare  . Drug use: No  . Sexual  activity: Yes  Lifestyle  . Physical activity    Days per week: Not on file    Minutes per session:  Not on file  . Stress: Not on file  Relationships  . Social Herbalist on phone: Not on file    Gets together: Not on file    Attends religious service: Not on file    Active member of club or organization: Not on file    Attends meetings of clubs or organizations: Not on file    Relationship status: Not on file  . Intimate partner violence    Fear of current or ex partner: Not on file    Emotionally abused: Not on file    Physically abused: Not on file    Forced sexual activity: Not on file  Other Topics Concern  . Not on file  Social History Narrative   Does have HPOA- sons Eddie Dibbles and Wille Glaser.   Does desire life support if not futile.   Not sure about feeding tubes.   The PMH, PSH, Social History, Family History, Medications, and allergies have been reviewed in [facility], and have been updated if relevant.   Review of Systems  Psychiatric/Behavioral: Positive for sleep disturbance. Negative for agitation, behavioral problems, confusion, decreased concentration, dysphoric mood, hallucinations, self-injury and suicidal ideas. The patient is nervous/anxious. The patient is not hyperactive.   All other systems reviewed and are negative.      Objective:    BP 134/88 (BP Location: Left Arm, Patient Position: Sitting, Cuff Size: Normal)   Pulse 96   Temp 97.7 F (36.5 C) (Oral)   Ht 5\' 3"  (1.6 m)   Wt 128 lb 6.4 oz (58.2 kg)   SpO2 98%   BMI 22.75 kg/m   Wt Readings from Last 3 Encounters:  05/23/19 128 lb 6.4 oz (58.2 kg)  09/09/18 131 lb 3.2 oz (59.5 kg)  06/14/18 132 lb (59.9 kg)     Physical Exam Vitals signs and nursing note reviewed.  Psychiatric:        Attention and Perception: Attention and perception normal. She is attentive. She does not perceive auditory hallucinations.        Mood and Affect: Mood is anxious. Mood is not depressed. Affect is not labile,  flat, angry, tearful or inappropriate.        Speech: Speech normal.        Behavior: Behavior normal.        Thought Content: Thought content normal.        Cognition and Memory: Cognition and memory normal.        Judgment: Judgment normal.     Comments: Anxious mood but she is typically anxious

## 2019-05-23 NOTE — Assessment & Plan Note (Addendum)
Deteriorated. >40 minutes spent in face to face time with patient, >50% spent in counselling or coordination of care discussing insomnia and increased anxiety.  See AVS for full details but we agree that we need to taper her off of trazodone - dose is way to high for her age and no longer working.  Take a week or two break from melatonin as she likely has built a tolerance. Start Belsomra 10 mg nightly- she will update me in a few days and then in a couple of weeks. Will repeat her GAD7 screen at that time, if still as high, we also need to treat her anxiety. Check additional labs today to rule out contributing factors. The patient indicates understanding of these issues and agrees with the plan. Orders Placed This Encounter  Procedures  . MM 3D SCREEN BREAST BILATERAL  . DG Bone Density  . CBC with Differential/Platelet  . Comprehensive metabolic panel  . T4, free  . TSH

## 2019-05-23 NOTE — Telephone Encounter (Signed)
Questions for Screening COVID-19  Symptom onset: n/a  Travel or Contacts: no  During this illness, did/does the patient experience any of the following symptoms? Fever >100.42F []   Yes [x]   No []   Unknown Subjective fever (felt feverish) []   Yes [x]   No []   Unknown Chills []   Yes [x]   No []   Unknown Muscle aches (myalgia) []   Yes [x]   No []   Unknown Runny nose (rhinorrhea) []   Yes [x]   No []   Unknown Sore throat []   Yes [x]   No []   Unknown Cough (new onset or worsening of chronic cough) []   Yes [x]   No []   Unknown Shortness of breath (dyspnea) []   Yes [x]   No []   Unknown Nausea or vomiting []   Yes [x]   No []   Unknown Headache []   Yes [x]   No []   Unknown Abdominal pain  []   Yes [x]   No []   Unknown Diarrhea (?3 loose/looser than normal stools/24hr period) []   Yes [x]   No []   Unknown Other, specify:  Patient risk factors: Smoker? []   Current []   Former []   Never If female, currently pregnant? []   Yes []   No  Patient Active Problem List   Diagnosis Date Noted  . Well woman exam 04/26/2018  . IBS (irritable bowel syndrome) 04/11/2013  . Atrial fibrillation ([place]) 01/27/2013  . Cerebrovascular disease 01/27/2013  . TIA (transient ischemic attack) 12/14/2012  . Insomnia 11/02/2012  . URI (upper respiratory infection) 12/15/2011  . GERD 03/12/2010  . Hypothyroidism 04/14/2007  . Depression 04/14/2007  . Essential hypertension 04/14/2007  . Osteoporosis 04/14/2007    Plan:  []   High risk for COVID-19 with red flags go to ED (with CP, SOB, weak/lightheaded, or fever > 101.5). Call ahead.  []   High risk for COVID-19 but stable. Inform provider and coordinate time for [facility] visit.   []   No red flags but URI signs or symptoms okay for [facility] visit.

## 2019-05-24 LAB — COMPREHENSIVE METABOLIC PANEL
AG Ratio: 1.8 (calc) (ref 1.0–2.5)
ALT: 16 U/L (ref 6–29)
AST: 17 U/L (ref 10–35)
Albumin: 4.7 g/dL (ref 3.6–5.1)
Alkaline phosphatase (APISO): 73 U/L (ref 37–153)
BUN/Creatinine Ratio: 17 (calc) (ref 6–22)
BUN: 17 mg/dL (ref 7–25)
CO2: 28 mmol/L (ref 20–32)
Calcium: 9.4 mg/dL (ref 8.6–10.4)
Chloride: 98 mmol/L (ref 98–110)
Creat: 0.98 mg/dL — ABNORMAL HIGH (ref 0.60–0.93)
Globulin: 2.6 g/dL (calc) (ref 1.9–3.7)
Glucose, Bld: 82 mg/dL (ref 65–99)
Potassium: 3.1 mmol/L — ABNORMAL LOW (ref 3.5–5.3)
Sodium: 139 mmol/L (ref 135–146)
Total Bilirubin: 0.6 mg/dL (ref 0.2–1.2)
Total Protein: 7.3 g/dL (ref 6.1–8.1)

## 2019-05-24 LAB — CBC WITH DIFFERENTIAL/PLATELET
Absolute Monocytes: 1456 cells/uL — ABNORMAL HIGH (ref 200–950)
Basophils Absolute: 101 cells/uL (ref 0–200)
Basophils Relative: 0.9 %
Eosinophils Absolute: 235 cells/uL (ref 15–500)
Eosinophils Relative: 2.1 %
HCT: 41.8 % (ref 35.0–45.0)
Hemoglobin: 14.1 g/dL (ref 11.7–15.5)
Lymphs Abs: 2912 cells/uL (ref 850–3900)
MCH: 29.2 pg (ref 27.0–33.0)
MCHC: 33.7 g/dL (ref 32.0–36.0)
MCV: 86.5 fL (ref 80.0–100.0)
MPV: 11 fL (ref 7.5–12.5)
Monocytes Relative: 13 %
Neutro Abs: 6496 cells/uL (ref 1500–7800)
Neutrophils Relative %: 58 %
Platelets: 381 10*3/uL (ref 140–400)
RBC: 4.83 10*6/uL (ref 3.80–5.10)
RDW: 13 % (ref 11.0–15.0)
Total Lymphocyte: 26 %
WBC: 11.2 10*3/uL — ABNORMAL HIGH (ref 3.8–10.8)

## 2019-05-24 LAB — TSH: TSH: 1.57 mIU/L (ref 0.40–4.50)

## 2019-05-24 LAB — T4, FREE: Free T4: 1.2 ng/dL (ref 0.8–1.8)

## 2019-05-24 NOTE — Telephone Encounter (Signed)
Next Prolia injection will be due October 2020. RN will contact patient closer to this date once insurance verification has been completed.

## 2019-05-24 NOTE — Telephone Encounter (Signed)
Thank you :)

## 2019-06-07 ENCOUNTER — Other Ambulatory Visit: Payer: Self-pay | Admitting: Family Medicine

## 2019-06-07 NOTE — Telephone Encounter (Signed)
TA-Plz see refill req for Clonazepam/LOV: 7.20.20/NOV: 9.2.20/Last filled 1.22.20/per [place] PMP pt is compliant without red flags/thx dmf

## 2019-06-15 ENCOUNTER — Encounter: Payer: Self-pay | Admitting: *Deleted

## 2019-07-05 ENCOUNTER — Telehealth: Payer: Self-pay

## 2019-07-05 DIAGNOSIS — E785 Hyperlipidemia, unspecified: Secondary | ICD-10-CM | POA: Insufficient documentation

## 2019-07-05 NOTE — Assessment & Plan Note (Addendum)
Rate controlled on toprol. On Xarelto for anticoagulation.  On statin. Followed by cardiology, has appointment scheduled with Dr. Stanford Breed later this month.

## 2019-07-05 NOTE — Telephone Encounter (Signed)
Questions for Screening COVID-19  Symptom onset: None  Travel or Contacts: None  During this illness, did/does the patient experience any of the following symptoms? Fever >100.15F []   Yes [x]   No []   Unknown Subjective fever (felt feverish) []   Yes [x]   No []   Unknown Chills []   Yes [x]   No []   Unknown Muscle aches (myalgia) []   Yes [x]   No []   Unknown Runny nose (rhinorrhea) []   Yes [x]   No []   Unknown Sore throat []   Yes [x]   No []   Unknown Cough (new onset or worsening of chronic cough) []   Yes [x]   No []   Unknown Shortness of breath (dyspnea) []   Yes [x]   No []   Unknown Nausea or vomiting []   Yes [x]   No []   Unknown Headache []   Yes [x]   No []   Unknown Abdominal pain  []   Yes [x]   No []   Unknown Diarrhea (?3 loose/looser than normal stools/24hr period) []   Yes []   No []   Unknown Other, specify:  Patient risk factors: Smoker? []   Current []   Former []   Never If female, currently pregnant? []   Yes []   No  Patient Active Problem List   Diagnosis Date Noted  . Hyperlipidemia   . Situational anxiety 05/23/2019  . IBS (irritable bowel syndrome) 04/11/2013  . Atrial fibrillation ([place]) 01/27/2013  . Cerebrovascular disease 01/27/2013  . Insomnia 11/02/2012  . GERD 03/12/2010  . Hypothyroidism 04/14/2007  . Depression 04/14/2007  . Essential hypertension 04/14/2007  . Osteoporosis 04/14/2007    Plan:  []   High risk for COVID-19 with red flags go to ED (with CP, SOB, weak/lightheaded, or fever > 101.5). Call ahead.  []   High risk for COVID-19 but stable. Inform provider and coordinate time for [facility] visit.   []   No red flags but URI signs or symptoms okay for [facility] visit.

## 2019-07-05 NOTE — Assessment & Plan Note (Signed)
Improved with Prolia- next dose due in 08/2019. Marland KitchenDEXA 05/05/16- improvement/osteopenia.  DEXA scheduled for 6/21. She is taking caltate along with an additional Vitamin D supplement. Also getting weight bearing exercises. Check calcium and Vit D today.

## 2019-07-05 NOTE — Assessment & Plan Note (Signed)
Well controlled on current rxs. 

## 2019-07-05 NOTE — Assessment & Plan Note (Signed)
Clinically euthyroid.  Labs completed last month.

## 2019-07-05 NOTE — Progress Notes (Signed)
Subjective:   Patient ID: Patricia Peck, female    DOB: 1946/10/02, 73 y.o.   MRN: [id number]  Patricia Peck is a pleasant 73 y.o. year old female who presents to clinic todayFollow-up (Pt screened at vehicle.  She is here today for a F/U.  She is not fasting.  She had her flu shot on 8.26.20. She states that the Belsomra and Clonazepam was working till a death in the family.) and follow up of chronic medical conditions on 07/06/2019   She had medicare wellness visit with Glenard Haring, RN on 05/18/19.  Note reviewed.    Health Maintenance  Topic Date Due  . MAMMOGRAM  04/27/2020  . COLONOSCOPY  07/20/2024  . TETANUS/TDAP  04/16/2025  . INFLUENZA VACCINE  Completed  . DEXA SCAN  Completed  . Hepatitis C Screening  Completed  . PNA vac Low Risk Adult  Completed     Still playing tennis multiple times per week.  Insomnia- persistent issue for her. Last saw pt on 05/23/19 for insomnia.  Note reviewed. At that [place], she was supposed to be taking Melatonin and trazodone 150 mg qhs.  But she has been taking trazodone up to 300 mg qhs ( she increased dosage on her own) and melatonin 20 mg.  Clonazepam also ineffective. Had been taking this dose of trazodone for almost a year but I was unaware.    After a long discussion, we agreed to wean her off of trazodone and start Belsomra 10 mg night. Also advised to take a two week break from melatonin.  She states this was working well until she had a death in the family but the more she thinks about it, she still had night time awakenings prior to her step daughter's death. She definitely is sleeping better than she was on previous rxs.  Anxiety and depression better than it was in 05/2019.  GAD 7 : Generalized Anxiety Score 07/06/2019 05/23/2019 04/26/2018  Nervous, Anxious, on Edge 1 3 0  Control/stop worrying 0 3 0  Worry too much - different things 1 3 1   Trouble relaxing 0 3 0  Restless 0 2 0  Easily annoyed or irritable 1 3 0  Afraid - awful  might happen 0 0 0  Total GAD 7 Score 3 17 1   Anxiety Difficulty Somewhat difficult Not difficult at all Not difficult at all   Depression screen [facility] 2/9 07/06/2019 05/23/2019 05/18/2019 04/26/2018 04/21/2018  Decreased Interest 0 0 0 0 0  Down, Depressed, Hopeless 0 0 0 0 0  PHQ - 2 Score 0 0 0 0 0  Altered sleeping 1 - - 1 -  Tired, decreased energy 1 - - 0 -  Change in appetite 0 - - 0 -  Feeling bad or failure about yourself  0 - - 0 -  Trouble concentrating 0 - - 0 -  Moving slowly or fidgety/restless 0 - - 0 -  Suicidal thoughts 0 - - 0 -  PHQ-9 Score 2 - - 1 -  Difficult doing work/chores Somewhat difficult - - Not difficult at all -  Some recent data might be hidden    She is taking Wellbutrin 150 mg XL daily and as needed Klonopin. PDMP reviewed.  GERD-remains on Dexilant.  Paroxysmal afib-  followed by Dr. Stanford Breed. On toprol, statin and xarelto   Osteoporosis-  Receiving prolia.  Last dose 02/17/19. DEXA 05/05/16- improvement/osteopenia.  DEXA scheduled for 6/21. She is taking caltate along with an additional Vitamin  D supplement.  Hypothyroidism- on synthroid 50 mcg daily. Denies any symptoms of hypo or hyperthyroidism.   Lab Results  Component Value Date   TSH 1.57 05/23/2019   HLD- has been well controlled on lipitor 20 mg daily.  Due for labs. The 10-year ASCVD risk score Mikey Bussing DC Jr., et al., 2013) is: 18.2%   Values used to calculate the score:     Age: 73 years     Sex: Female     Is Non-Hispanic African American: No     Diabetic: No     Tobacco smoker: No     Systolic Blood Pressure: 123XX123 mmHg     Is BP treated: Yes     HDL Cholesterol: 39 mg/dL     Total Cholesterol: 162 mg/dL  Lab Results  Component Value Date   CHOL 162 04/21/2018   HDL 39.00 (L) 04/21/2018   LDLCALC 87 04/21/2018   LDLDIRECT 129.2 05/28/2007   TRIG 182.0 (H) 04/21/2018   CHOLHDL 4 04/21/2018   Lab Results  Component Value Date   NA 139 05/23/2019   K 3.1 (L) 05/23/2019    CL 98 05/23/2019   CO2 28 05/23/2019   Lab Results  Component Value Date   ALT 16 05/23/2019   AST 17 05/23/2019   ALKPHOS 63 04/21/2018   BILITOT 0.6 05/23/2019   Lab Results  Component Value Date   WBC 11.2 (H) 05/23/2019   HGB 14.1 05/23/2019   HCT 41.8 05/23/2019   MCV 86.5 05/23/2019   PLT 381 05/23/2019     Current Outpatient Medications on File Prior to Visit  Medication Sig Dispense Refill  . Alpha-D-Galactosidase (BEANO PO) Take by mouth 2 (two) times daily.    Marland Kitchen atorvastatin (LIPITOR) 20 MG tablet Take 1 tablet (20 mg total) by mouth daily. 90 tablet 1  . Bepotastine Besilate (BEPREVE) 1.5 % SOLN Use one drop in each eye twice a day    . bumetanide (BUMEX) 1 MG tablet Take 1 tablet (1 mg total) by mouth 2 (two) times daily. 180 tablet 1  . buPROPion (WELLBUTRIN XL) 150 MG 24 hr tablet TAKE 1 TABLET BY MOUTH DAILY. 90 tablet 1  . Calcium Carbonate-Vitamin D (CALCIUM 600/VITAMIN D) 600-400 MG-UNIT per tablet Take 1 tablet by mouth 2 (two) times daily.      . Cholecalciferol (VITAMIN D-3) 5000 UNITS TABS Take one tablet by mouth three times a week    . clonazePAM (KLONOPIN) 0.5 MG tablet TAKE 1 TABLET BY MOUTH AT BEDTIME AS NEEDED FOR ANXIETY 30 tablet 0  . denosumab (PROLIA) 60 MG/ML SOLN injection Inject 60 mg into the skin once. Administer in upper arm, thigh, or abdomen 180 mL 0  . diclofenac sodium (VOLTAREN) 1 % GEL Apply 2g to affected area qid prn 100 g 3  . famotidine (PEPCID AC) 10 MG chewable tablet Chew 1 tablet (10 mg total) by mouth daily. 90 tablet prm  . Fluticasone-Salmeterol (ADVAIR DISKUS) 100-50 MCG/DOSE AEPB Inhale 1 puff into the lungs 2 (two) times daily. 180 each 1  . levocetirizine (XYZAL) 5 MG tablet Take 5 mg by mouth daily.  1  . levothyroxine (SYNTHROID) 50 MCG tablet Take 1 tablet (50 mcg total) by mouth daily. 90 tablet 1  . metoprolol succinate (TOPROL-XL) 25 MG 24 hr tablet Take 3 tablets (75 mg total) by mouth daily. 270 tablet 1  .  NEXIUM 40 MG capsule TAKE 1 CAPSULE BY MOUTH DAILY. 90 capsule 1  . nystatin (  MYCOSTATIN) 100000 UNIT/ML suspension SWISH BY MOUTH 10 ML UP TO 4 TIMES A DAY 240 mL 1  . potassium chloride (K-DUR) 10 MEQ tablet TAKE 1 TABLET BY MOUTH 2 TIMES DAILY. 180 tablet 1  . Suvorexant (BELSOMRA) 10 MG TABS Take 1 tablet by mouth at bedtime. 30 tablet 3  . XARELTO 20 MG TABS tablet TAKE 1 TABLET BY MOUTH DAILY. 90 tablet 1   No current facility-administered medications on file prior to visit.     No Known Allergies  Past Medical History:  Diagnosis Date  . Allergy   . Arthritis    hands  . Asthma   . Atrial fibrillation ([place])   . Atrial flutter ([place])   . Depression   . Dysrhythmia    a-fib, on Xarelto  . GERD (gastroesophageal reflux disease)   . Hyperlipidemia   . Hypertension   . Hypothyroidism   . Osteoporosis   . TIA (transient ischemic attack)     Past Surgical History:  Procedure Laterality Date  . ABDOMINAL HYSTERECTOMY    . COLONOSCOPY    . MYRINGOTOMY WITH TUBE PLACEMENT Right 01/14/2016   Procedure: RIGHT MYRINGOTOMY WITH T-TUBE PLACEMENT;  Surgeon: Leta Baptist, MD;  Location: [facility];  Service: ENT;  Laterality: Right;  . PARTIAL HYSTERECTOMY  1989  . TEE WITHOUT CARDIOVERSION N/A 12/22/2012   Procedure: TRANSESOPHAGEAL ECHOCARDIOGRAM (TEE);  Surgeon: Lelon Perla, MD;  Location: [facility] ENDOSCOPY;  Service: Cardiovascular;  Laterality: N/A;    Family History  Problem Relation Age of Onset  . Breast cancer Mother   . Drug abuse Son   . Depression Son   . Stroke Paternal Aunt   . Heart disease Paternal Grandfather   . Diabetes Maternal Grandfather     Social History   Socioeconomic History  . Marital status: Married    Spouse name: Not on file  . Number of children: 2  . Years of education: Not on file  . Highest education level: Not on file  Occupational History    Employer: RETIRED  Social Needs  . Financial resource strain: Not on file  .  Food insecurity    Worry: Not on file    Inability: Not on file  . Transportation needs    Medical: Not on file    Non-medical: Not on file  Tobacco Use  . Smoking status: Never Smoker  . Smokeless tobacco: Never Used  Substance and Sexual Activity  . Alcohol use: Yes    Comment: Rare  . Drug use: No  . Sexual activity: Yes  Lifestyle  . Physical activity    Days per week: Not on file    Minutes per session: Not on file  . Stress: Not on file  Relationships  . Social Herbalist on phone: Not on file    Gets together: Not on file    Attends religious service: Not on file    Active member of club or organization: Not on file    Attends meetings of clubs or organizations: Not on file    Relationship status: Not on file  . Intimate partner violence    Fear of current or ex partner: Not on file    Emotionally abused: Not on file    Physically abused: Not on file    Forced sexual activity: Not on file  Other Topics Concern  . Not on file  Social History Narrative   Does have HPOA- sons Eddie Dibbles and Wille Glaser.  Does desire life support if not futile.   Not sure about feeding tubes.   The PMH, PSH, Social History, Family History, Medications, and allergies have been reviewed in [facility], and have been updated if relevant.  Review of Systems  Constitutional: Negative.   HENT: Negative.   Eyes: Negative.   Respiratory: Negative.   Cardiovascular: Negative.   Gastrointestinal: Negative.   Endocrine: Negative.   Genitourinary: Negative.   Musculoskeletal: Negative.   Skin: Negative.   Allergic/Immunologic: Negative.   Neurological: Negative.   Hematological: Negative.   Psychiatric/Behavioral: Positive for sleep disturbance. Negative for agitation, behavioral problems, confusion, decreased concentration, dysphoric mood and hallucinations. The patient is not nervous/anxious and is not hyperactive.   All other systems reviewed and are negative.      Objective:    BP 126/68  (BP Location: Left Arm, Patient Position: Sitting, Cuff Size: Normal)   Pulse 67   Temp 98 F (36.7 C) (Oral)   Ht 5\' 4"  (1.626 m)   Wt 132 lb 9.6 oz (60.1 kg)   SpO2 98%   BMI 22.76 kg/m    Physical Exam  Constitutional: She is oriented to person, place, and time. She appears well-developed and well-nourished. No distress.  HENT:  Head: Normocephalic and atraumatic.  Eyes: Conjunctivae are normal.  Neck: Normal range of motion.  Cardiovascular: Normal rate. An irregularly irregular rhythm present.  Pulmonary/Chest: Effort normal and breath sounds normal.  Abdominal: Soft. Bowel sounds are normal.  Musculoskeletal: Normal range of motion.        General: No edema.  Neurological: She is alert and oriented to person, place, and time. She displays normal reflexes. No cranial nerve deficit. She exhibits normal muscle tone. Coordination normal.  Skin: Skin is warm and dry. She is not diaphoretic.  Psychiatric: She has a normal mood and affect. Her behavior is normal. Judgment and thought content normal.  Nursing note and vitals reviewed.         Assessment & Plan:   Other specified hypothyroidism  Other insomnia  Osteoporosis, unspecified osteoporosis type, unspecified pathological fracture presence - Plan: Vitamin D (25 hydroxy)  Atrial fibrillation, unspecified type ([place])  Hyperlipidemia, unspecified hyperlipidemia type - Plan: Lipid panel, Comprehensive metabolic panel, CBC with Differential/Platelet  Essential hypertension No follow-ups on file.

## 2019-07-06 ENCOUNTER — Encounter: Payer: Self-pay | Admitting: Family Medicine

## 2019-07-06 ENCOUNTER — Ambulatory Visit (INDEPENDENT_AMBULATORY_CARE_PROVIDER_SITE_OTHER): Payer: Medicare Other | Admitting: Family Medicine

## 2019-07-06 ENCOUNTER — Other Ambulatory Visit: Payer: Self-pay

## 2019-07-06 VITALS — BP 126/68 | HR 67 | Temp 98.0°F | Ht 64.0 in | Wt 132.6 lb

## 2019-07-06 DIAGNOSIS — I4891 Unspecified atrial fibrillation: Secondary | ICD-10-CM

## 2019-07-06 DIAGNOSIS — I10 Essential (primary) hypertension: Secondary | ICD-10-CM

## 2019-07-06 DIAGNOSIS — M810 Age-related osteoporosis without current pathological fracture: Secondary | ICD-10-CM | POA: Diagnosis not present

## 2019-07-06 DIAGNOSIS — E038 Other specified hypothyroidism: Secondary | ICD-10-CM

## 2019-07-06 DIAGNOSIS — F329 Major depressive disorder, single episode, unspecified: Secondary | ICD-10-CM

## 2019-07-06 DIAGNOSIS — G4709 Other insomnia: Secondary | ICD-10-CM | POA: Diagnosis not present

## 2019-07-06 DIAGNOSIS — E785 Hyperlipidemia, unspecified: Secondary | ICD-10-CM | POA: Diagnosis not present

## 2019-07-06 DIAGNOSIS — F32A Depression, unspecified: Secondary | ICD-10-CM

## 2019-07-06 LAB — CBC WITH DIFFERENTIAL/PLATELET
Basophils Absolute: 0.1 10*3/uL (ref 0.0–0.1)
Basophils Relative: 0.9 % (ref 0.0–3.0)
Eosinophils Absolute: 0.2 10*3/uL (ref 0.0–0.7)
Eosinophils Relative: 2.1 % (ref 0.0–5.0)
HCT: 39.6 % (ref 36.0–46.0)
Hemoglobin: 13.4 g/dL (ref 12.0–15.0)
Lymphocytes Relative: 25.7 % (ref 12.0–46.0)
Lymphs Abs: 2.5 10*3/uL (ref 0.7–4.0)
MCHC: 33.8 g/dL (ref 30.0–36.0)
MCV: 87.7 fl (ref 78.0–100.0)
Monocytes Absolute: 1 10*3/uL (ref 0.1–1.0)
Monocytes Relative: 10.1 % (ref 3.0–12.0)
Neutro Abs: 6 10*3/uL (ref 1.4–7.7)
Neutrophils Relative %: 61.2 % (ref 43.0–77.0)
Platelets: 335 10*3/uL (ref 150.0–400.0)
RBC: 4.52 Mil/uL (ref 3.87–5.11)
RDW: 13.5 % (ref 11.5–15.5)
WBC: 9.8 10*3/uL (ref 4.0–10.5)

## 2019-07-06 LAB — VITAMIN D 25 HYDROXY (VIT D DEFICIENCY, FRACTURES): VITD: 69.63 ng/mL (ref 30.00–100.00)

## 2019-07-06 LAB — COMPREHENSIVE METABOLIC PANEL
ALT: 13 U/L (ref 0–35)
AST: 13 U/L (ref 0–37)
Albumin: 4.1 g/dL (ref 3.5–5.2)
Alkaline Phosphatase: 85 U/L (ref 39–117)
BUN: 15 mg/dL (ref 6–23)
CO2: 29 mEq/L (ref 19–32)
Calcium: 9.3 mg/dL (ref 8.4–10.5)
Chloride: 100 mEq/L (ref 96–112)
Creatinine, Ser: 0.97 mg/dL (ref 0.40–1.20)
GFR: 56.13 mL/min — ABNORMAL LOW (ref >60.00)
Glucose, Bld: 97 mg/dL (ref 70–99)
Potassium: 3.3 mEq/L — ABNORMAL LOW (ref 3.5–5.1)
Sodium: 139 mEq/L (ref 135–145)
Total Bilirubin: 0.5 mg/dL (ref 0.2–1.2)
Total Protein: 7.2 g/dL (ref 6.0–8.3)

## 2019-07-06 LAB — LIPID PANEL
Cholesterol: 161 mg/dL (ref 0–200)
HDL: 38.1 mg/dL — ABNORMAL LOW (ref >39.00)
LDL Cholesterol: 92 mg/dL (ref 0–99)
NonHDL: 122.9
Total CHOL/HDL Ratio: 4
Triglycerides: 157 mg/dL — ABNORMAL HIGH (ref 0.0–149.0)
VLDL: 31.4 mg/dL (ref 0.0–40.0)

## 2019-07-06 MED ORDER — BELSOMRA 15 MG PO TABS: 1.0000 | ORAL_TABLET | Freq: Every day | ORAL | 3 refills | Status: DC

## 2019-07-06 NOTE — Patient Instructions (Addendum)
Great to see you. I will call you with your lab results from today and you can view them online.   We are increasing your Belsomra to 15 mg nightly.  Keep me updated.  Happy birthday!  Start silver sneakers online.  Please give Joe my condolences.

## 2019-07-06 NOTE — Assessment & Plan Note (Signed)
Greatly improved in the past two months. No changes made to rxs.

## 2019-07-06 NOTE — Assessment & Plan Note (Addendum)
Improved with Belsomra until she had a death in the family. We agreed to increase her Belsomra to 15 mg daily. Call or send my chart message prn if these symptoms worsen or fail to improve as anticipated. The patient indicates understanding of these issues and agrees with the plan.

## 2019-07-06 NOTE — Assessment & Plan Note (Signed)
Continue statin. Orders Placed This Encounter  Procedures  . Lipid panel  . Comprehensive metabolic panel  . CBC with Differential/Platelet  . Vitamin D (25 hydroxy)

## 2019-07-08 ENCOUNTER — Telehealth: Payer: Self-pay | Admitting: Family Medicine

## 2019-07-08 NOTE — Telephone Encounter (Signed)
Patient came into office and dropped off prior approval information for Nexium. Patient PA expires on 08/16/2019 and patient wants to make sure we obtain the PA prior to it expiring so that she can continue to receive her medication without interruption. Please call patient to follow up and medication PA has been complete. PA information paper is in Dr. Hulen Shouts file at the front in her file.

## 2019-07-12 ENCOUNTER — Other Ambulatory Visit: Payer: Self-pay | Admitting: Family Medicine

## 2019-07-12 NOTE — Telephone Encounter (Signed)
Forwarding to PCP for consideration of refill.

## 2019-07-13 NOTE — Telephone Encounter (Signed)
Fyi.

## 2019-07-26 ENCOUNTER — Ambulatory Visit: Payer: Medicare Other | Admitting: Cardiology

## 2019-08-01 ENCOUNTER — Encounter: Payer: Self-pay | Admitting: Cardiology

## 2019-08-01 DIAGNOSIS — Z8673 Personal history of transient ischemic attack (TIA), and cerebral infarction without residual deficits: Secondary | ICD-10-CM | POA: Insufficient documentation

## 2019-08-01 DIAGNOSIS — Z7901 Long term (current) use of anticoagulants: Secondary | ICD-10-CM | POA: Insufficient documentation

## 2019-08-02 ENCOUNTER — Ambulatory Visit (INDEPENDENT_AMBULATORY_CARE_PROVIDER_SITE_OTHER): Payer: Medicare Other | Admitting: Cardiology

## 2019-08-02 ENCOUNTER — Encounter: Payer: Self-pay | Admitting: Cardiology

## 2019-08-02 ENCOUNTER — Other Ambulatory Visit: Payer: Self-pay

## 2019-08-02 VITALS — BP 122/74 | HR 53 | Temp 97.2°F | Ht 65.0 in | Wt 136.0 lb

## 2019-08-02 DIAGNOSIS — I480 Paroxysmal atrial fibrillation: Secondary | ICD-10-CM

## 2019-08-02 DIAGNOSIS — I679 Cerebrovascular disease, unspecified: Secondary | ICD-10-CM

## 2019-08-02 DIAGNOSIS — Z7901 Long term (current) use of anticoagulants: Secondary | ICD-10-CM

## 2019-08-02 DIAGNOSIS — I6523 Occlusion and stenosis of bilateral carotid arteries: Secondary | ICD-10-CM

## 2019-08-02 DIAGNOSIS — E785 Hyperlipidemia, unspecified: Secondary | ICD-10-CM

## 2019-08-02 DIAGNOSIS — Z8673 Personal history of transient ischemic attack (TIA), and cerebral infarction without residual deficits: Secondary | ICD-10-CM

## 2019-08-02 NOTE — Assessment & Plan Note (Signed)
Diagnosed on monitor March 2014- anticoagulation recommended.  Echo 2/14- normal LF and LA

## 2019-08-02 NOTE — Assessment & Plan Note (Signed)
2014

## 2019-08-02 NOTE — Progress Notes (Addendum)
Cardiology Office Note:    Date:  08/02/2019   ID:  LAYONA ROSSIN, DOB [birth year], MRN [id number]  PCP:  Lucille Passy, MD  Cardiologist:  Kirk Ruths, MD  Electrophysiologist:  None   Referring MD: Lucille Passy, MD   No chief complaint on file.   History of Present Illness:    KAITLYNN KEIDEL is a 73 y.o. female with a hx of remote PAF in 2014.  She also had a TIA in 2014.  She has been on chronic anticoagulation with Xarelto.  Echocardiogram in 2014 showed preserved LV function with an EF of 55 to 60%.  Other medical issues include treated dyslipidemia and history of carotid and subclavian disease.  She has had mild carotid stenosis in the past with a 1 to 40% bilateral internal carotid artery narrowing.  Her last Doppler was in February 2018.  She also had some left subclavian artery narrowing noted.  On exam she has a 20 mm difference in the blood pressures in her arms.  Since we saw the patient last she has been doing well.  She did say that her husband is ill and she has stopped her routine treadmill exercising in the last few months to help take care of him.  She has not had unusual dyspnea or syncope or near syncope.  She denies any symptoms of left arm p pain with use.  Past Medical History:  Diagnosis Date  . Allergy   . Arthritis    hands  . Asthma   . Atrial fibrillation ([place])   . Atrial flutter ([place])   . Depression   . Dysrhythmia    a-fib, on Xarelto  . GERD (gastroesophageal reflux disease)   . Hyperlipidemia   . Hypertension   . Hypothyroidism   . Osteoporosis   . TIA (transient ischemic attack)     Past Surgical History:  Procedure Laterality Date  . ABDOMINAL HYSTERECTOMY    . COLONOSCOPY    . MYRINGOTOMY WITH TUBE PLACEMENT Right 01/14/2016   Procedure: RIGHT MYRINGOTOMY WITH T-TUBE PLACEMENT;  Surgeon: Leta Baptist, MD;  Location: [facility];  Service: ENT;  Laterality: Right;  . PARTIAL HYSTERECTOMY  1989  . TEE WITHOUT CARDIOVERSION  N/A 12/22/2012   Procedure: TRANSESOPHAGEAL ECHOCARDIOGRAM (TEE);  Surgeon: Lelon Perla, MD;  Location: [facility] ENDOSCOPY;  Service: Cardiovascular;  Laterality: N/A;    Current Medications: Current Meds  Medication Sig  . Alpha-D-Galactosidase (BEANO PO) Take by mouth 2 (two) times daily.  Marland Kitchen atorvastatin (LIPITOR) 20 MG tablet Take 1 tablet (20 mg total) by mouth daily.  . Bepotastine Besilate (BEPREVE) 1.5 % SOLN Use one drop in each eye twice a day  . bumetanide (BUMEX) 1 MG tablet Take 1 tablet (1 mg total) by mouth 2 (two) times daily.  Marland Kitchen buPROPion (WELLBUTRIN XL) 150 MG 24 hr tablet TAKE 1 TABLET BY MOUTH DAILY.  . Calcium Carbonate-Vitamin D (CALCIUM 600/VITAMIN D) 600-400 MG-UNIT per tablet Take 1 tablet by mouth 2 (two) times daily.    . Cholecalciferol (VITAMIN D-3) 5000 UNITS TABS Take one tablet by mouth three times a week  . clonazePAM (KLONOPIN) 0.5 MG tablet TAKE 1 TABLET BY MOUTH AT BEDTIME AS NEEDED FOR ANXIETY  . denosumab (PROLIA) 60 MG/ML SOLN injection Inject 60 mg into the skin once. Administer in upper arm, thigh, or abdomen  . diclofenac sodium (VOLTAREN) 1 % GEL Apply 2g to affected area qid prn  . famotidine (PEPCID AC) 10 MG  chewable tablet Chew 1 tablet (10 mg total) by mouth daily.  . Fluticasone-Salmeterol (ADVAIR DISKUS) 100-50 MCG/DOSE AEPB Inhale 1 puff into the lungs 2 (two) times daily.  Marland Kitchen levocetirizine (XYZAL) 5 MG tablet Take 5 mg by mouth daily.  Marland Kitchen levothyroxine (SYNTHROID) 50 MCG tablet Take 1 tablet (50 mcg total) by mouth daily.  . metoprolol succinate (TOPROL-XL) 25 MG 24 hr tablet Take 3 tablets (75 mg total) by mouth daily.  Marland Kitchen NEXIUM 40 MG capsule TAKE 1 CAPSULE BY MOUTH DAILY.  Marland Kitchen nystatin (MYCOSTATIN) 100000 UNIT/ML suspension SWISH BY MOUTH 10 ML UP TO 4 TIMES A DAY  . potassium chloride (K-DUR) 10 MEQ tablet TAKE 1 TABLET BY MOUTH 2 TIMES DAILY.  . Suvorexant (BELSOMRA) 15 MG TABS Take 1 tablet by mouth at bedtime.  Alveda Reasons 20 MG TABS tablet  TAKE 1 TABLET BY MOUTH DAILY.     Allergies:   Patient has no known allergies.   Social History   Socioeconomic History  . Marital status: Married    Spouse name: Not on file  . Number of children: 2  . Years of education: Not on file  . Highest education level: Not on file  Occupational History    Employer: RETIRED  Social Needs  . Financial resource strain: Not on file  . Food insecurity    Worry: Not on file    Inability: Not on file  . Transportation needs    Medical: Not on file    Non-medical: Not on file  Tobacco Use  . Smoking status: Never Smoker  . Smokeless tobacco: Never Used  Substance and Sexual Activity  . Alcohol use: Yes    Comment: Rare  . Drug use: No  . Sexual activity: Yes  Lifestyle  . Physical activity    Days per week: Not on file    Minutes per session: Not on file  . Stress: Not on file  Relationships  . Social Herbalist on phone: Not on file    Gets together: Not on file    Attends religious service: Not on file    Active member of club or organization: Not on file    Attends meetings of clubs or organizations: Not on file    Relationship status: Not on file  Other Topics Concern  . Not on file  Social History Narrative   Does have HPOA- sons Eddie Dibbles and Wille Glaser.   Does desire life support if not futile.   Not sure about feeding tubes.     Family History: The patient's family history includes Breast cancer in her mother; Depression in her son; Diabetes in her maternal grandfather; Drug abuse in her son; Heart disease in her paternal grandfather; Stroke in her paternal aunt.  ROS:   Please see the history of present illness.     All other systems reviewed and are negative.  EKGs/Labs/Other Studies Reviewed:    The following studies were reviewed today: Echo 2014 Carotid dopplers 2018  EKG:  EKG is ordered today.  The ekg ordered today demonstrates NSR, SB-53 with PACs  Recent Labs: 05/23/2019: TSH 1.57 07/06/2019: ALT 13;  BUN 15; Creatinine, Ser 0.97; Hemoglobin 13.4; Platelets 335.0; Potassium 3.3; Sodium 139  Recent Lipid Panel    Component Value Date/Time   CHOL 161 07/06/2019 0954   TRIG 157.0 (H) 07/06/2019 0954   HDL 38.10 (L) 07/06/2019 0954   CHOLHDL 4 07/06/2019 0954   VLDL 31.4 07/06/2019 0954   LDLCALC 92  07/06/2019 0954   LDLDIRECT 129.2 05/28/2007 0852    Physical Exam:    VS:  BP 122/74   Pulse (!) 53   Temp (!) 97.2 F (36.2 C) (Temporal)   Ht 5\' 5"  (1.651 m)   Wt 136 lb (61.7 kg)   SpO2 98%   BMI 22.63 kg/m     Wt Readings from Last 3 Encounters:  08/02/19 136 lb (61.7 kg)  07/06/19 132 lb 9.6 oz (60.1 kg)  05/23/19 128 lb 6.4 oz (58.2 kg)    B/P LUE- 120/70, RUE 142/70  GEN:  Well nourished, well developed in no acute distress HEENT: Normal NECK: No JVD; LCA bruit LYMPHATICS: No lymphadenopathy CARDIAC: RRR, extra systole beats noted, no murmurs, rubs, gallops RESPIRATORY:  Clear to auscultation without rales, wheezing or rhonchi  ABDOMEN: Soft, non-tender, non-distended MUSCULOSKELETAL:  No edema; No deformity decreased Lt RA pulse SKIN: Warm and dry NEUROLOGIC:  Alert and oriented x 3 PSYCHIATRIC:  Normal affect   ASSESSMENT:    PAF (paroxysmal atrial fibrillation) ([place]) Diagnosed on monitor March 2014- anticoagulation recommended.  Echo 2/14- normal LF and LA  History of TIA (transient ischemic attack) 2014  Anticoagulated CHADS VASC= 5, on Xarelto  Cerebrovascular disease Carotid dopplers- 1-39% bilateral ICA and asymptomatic LSCA stenosis in 2016 and 2018  Hyperlipidemia Followed by PCP, on statin Rx, LDL 92 Sept 2020  PLAN:    Follow CA dopplers recommended at two years and I will order today.  If these are unchanged I'm not sure they need to be repeated unless she develops symptoms.  F/U Dr Stanford Breed in one year.     Medication Adjustments/Labs and Tests Ordered: Current medicines are reviewed at length with the patient today.  Concerns  regarding medicines are outlined above.  Orders Placed This Encounter  Procedures  . EKG 12-Lead  . VAS US CAROTID   No orders of the defined types were placed in this encounter.   Patient Instructions  Medication Instructions:  Your physician recommends that you continue on your current medications as directed. Please refer to the Current Medication list given to you today. If you need a refill on your cardiac medications before your next appointment, please call your pharmacy.   Lab work: None  If you have labs (blood work) drawn today and your tests are completely normal, you will receive your results only by: Marland Kitchen MyChart Message (if you have MyChart) OR . A paper copy in the mail If you have any lab test that is abnormal or we need to change your treatment, we will call you to review the results.  Testing/Procedures: Your physician has requested that you have a carotid duplex. This test is an ultrasound of the carotid arteries in your neck. It looks at blood flow through these arteries that supply the brain with blood. Allow one hour for this exam. There are no restrictions or special instructions.  Follow-Up: At [facility], you and your health needs are our priority.  As part of our continuing mission to provide you with exceptional heart care, we have created designated Provider Care Teams.  These Care Teams include your primary Cardiologist (physician) and Advanced Practice Providers (APPs -  Physician Assistants and Nurse Practitioners) who all work together to provide you with the care you need, when you need it. You will need a follow up appointment in 12 months.  Please call our office 2 months in advance to schedule this appointment.  You may see Kirk Ruths, MD or  one of the following Advanced Practice Providers on your designated Care Team:   Kerin Ransom, PA-C [place], [place] . Sande Rives, PA-C  Any Other Special Instructions Will Be Listed Below (If  Applicable).     Signed, Kerin Ransom, PA-C  08/02/2019 10:13 AM    [facility]

## 2019-08-02 NOTE — Assessment & Plan Note (Signed)
CHADS VASC= 5, on Xarelto

## 2019-08-02 NOTE — Assessment & Plan Note (Addendum)
Carotid dopplers- 1-39% bilateral ICA and asymptomatic LSCA stenosis in 2016 and 2018

## 2019-08-02 NOTE — Patient Instructions (Signed)
Medication Instructions:  Your physician recommends that you continue on your current medications as directed. Please refer to the Current Medication list given to you today. If you need a refill on your cardiac medications before your next appointment, please call your pharmacy.   Lab work: None  If you have labs (blood work) drawn today and your tests are completely normal, you will receive your results only by: Marland Kitchen MyChart Message (if you have MyChart) OR . A paper copy in the mail If you have any lab test that is abnormal or we need to change your treatment, we will call you to review the results.  Testing/Procedures: Your physician has requested that you have a carotid duplex. This test is an ultrasound of the carotid arteries in your neck. It looks at blood flow through these arteries that supply the brain with blood. Allow one hour for this exam. There are no restrictions or special instructions.  Follow-Up: At [facility], you and your health needs are our priority.  As part of our continuing mission to provide you with exceptional heart care, we have created designated Provider Care Teams.  These Care Teams include your primary Cardiologist (physician) and Advanced Practice Providers (APPs -  Physician Assistants and Nurse Practitioners) who all work together to provide you with the care you need, when you need it. You will need a follow up appointment in 12 months.  Please call our office 2 months in advance to schedule this appointment.  You may see Kirk Ruths, MD or one of the following Advanced Practice Providers on your designated Care Team:   Kerin Ransom, PA-C Roby Lofts, [place] . Sande Rives, PA-C  Any Other Special Instructions Will Be Listed Below (If Applicable).

## 2019-08-02 NOTE — Assessment & Plan Note (Signed)
Followed by PCP, on statin Rx, LDL 92 Sept 2020

## 2019-08-10 ENCOUNTER — Other Ambulatory Visit: Payer: Self-pay | Admitting: Cardiology

## 2019-08-10 DIAGNOSIS — Z8673 Personal history of transient ischemic attack (TIA), and cerebral infarction without residual deficits: Secondary | ICD-10-CM

## 2019-08-10 DIAGNOSIS — I6523 Occlusion and stenosis of bilateral carotid arteries: Secondary | ICD-10-CM

## 2019-08-10 DIAGNOSIS — I679 Cerebrovascular disease, unspecified: Secondary | ICD-10-CM

## 2019-08-15 ENCOUNTER — Ambulatory Visit (HOSPITAL_COMMUNITY): Payer: Medicare Other

## 2019-08-17 ENCOUNTER — Ambulatory Visit (HOSPITAL_COMMUNITY)
Admission: RE | Admit: 2019-08-17 | Discharge: 2019-08-17 | Disposition: A | Discharge status: Home / Self Care | Payer: Medicare Other | Source: Ambulatory Visit | Attending: Cardiology | Admitting: Cardiology

## 2019-08-17 ENCOUNTER — Ambulatory Visit (INDEPENDENT_AMBULATORY_CARE_PROVIDER_SITE_OTHER): Payer: Medicare Other | Admitting: Cardiology

## 2019-08-17 ENCOUNTER — Other Ambulatory Visit: Payer: Self-pay

## 2019-08-17 ENCOUNTER — Encounter: Payer: Self-pay | Admitting: Cardiology

## 2019-08-17 VITALS — BP 164/82 | HR 62 | Temp 97.0°F | Ht 65.0 in | Wt 138.0 lb

## 2019-08-17 DIAGNOSIS — Z8673 Personal history of transient ischemic attack (TIA), and cerebral infarction without residual deficits: Secondary | ICD-10-CM | POA: Diagnosis present

## 2019-08-17 DIAGNOSIS — I679 Cerebrovascular disease, unspecified: Secondary | ICD-10-CM | POA: Diagnosis present

## 2019-08-17 DIAGNOSIS — I6523 Occlusion and stenosis of bilateral carotid arteries: Secondary | ICD-10-CM | POA: Diagnosis present

## 2019-08-17 DIAGNOSIS — I10 Essential (primary) hypertension: Secondary | ICD-10-CM

## 2019-08-17 DIAGNOSIS — I739 Peripheral vascular disease, unspecified: Secondary | ICD-10-CM | POA: Diagnosis not present

## 2019-08-17 DIAGNOSIS — E785 Hyperlipidemia, unspecified: Secondary | ICD-10-CM | POA: Diagnosis not present

## 2019-08-17 DIAGNOSIS — I480 Paroxysmal atrial fibrillation: Secondary | ICD-10-CM

## 2019-08-17 DIAGNOSIS — R079 Chest pain, unspecified: Secondary | ICD-10-CM | POA: Diagnosis not present

## 2019-08-17 NOTE — Patient Instructions (Signed)
Medication Instructions:  Continue same medications   Lab work: Bmet to be done 1 week before Coronary CT   Testing/Procedures: Schedule Coronary CT      Follow instructions below  Follow-Up: At [facility], you and your health needs are our priority.  As part of our continuing mission to provide you with exceptional heart care, we have created designated Provider Care Teams.  These Care Teams include your primary Cardiologist (physician) and Advanced Practice Providers (APPs -  Physician Assistants and Nurse Practitioners) who all work together to provide you with the care you need, when you need it. . Follow up appointment will be determined after test     Your cardiac CT will be scheduled at one of the below locations:   [facility] [address] [place], [place] [address] (336) [place] [address] [place], [place] [address] [phone]  If scheduled at [facility], please arrive at the [facility] main entrance of [facility] 30-45 minutes prior to test start time. Proceed to the [facility] Radiology Department (first floor) to check-in and test prep.  If scheduled at [facility], please arrive 15 mins early for check-in and test prep.  Please follow these instructions carefully (unless otherwise directed):    On the Night Before the Test: . Be sure to Drink plenty of water. . Do not consume any caffeinated/decaffeinated beverages or chocolate 12 hours prior to your test. . Do not take any antihistamines 12 hours prior to your test.  On the Day of the Test: . Drink plenty of water. Do not drink any water within one hour of the test. . Do not eat any food 4 hours prior to the test. . You may take your regular medications prior to the test.  . Take your Toprol 75 mg  two hours prior to test. . FEMALES- please wear underwire-free bra if  available         After the Test: . Drink plenty of water. . After receiving IV contrast, you may experience a mild flushed feeling. This is normal. . On occasion, you may experience a mild rash up to 24 hours after the test. This is not dangerous. If this occurs, you can take Benadryl 25 mg and increase your fluid intake. . If you experience trouble breathing, this can be serious. If it is severe call 911 IMMEDIATELY. If it is mild, please call our office.     Please contact the cardiac imaging nurse navigator should you have any questions/concerns Marchia Bond, RN Navigator Cardiac Imaging [facility] [phone] Office

## 2019-08-17 NOTE — Progress Notes (Signed)
Cardiology Office Note   Date:  08/17/2019   ID:  Patricia Peck, DOB 1946-03-18, MRN [id number]  PCP:  Lucille Passy, MD  Cardiologist:   Lewayne Pauley Martinique, MD   Chief Complaint  Patient presents with  . Shoulder Pain      History of Present Illness: Patricia Peck is a 73 y.o. female who is seen as a work in for evaluation of left shoulder pain. She has a hx of remote PAF in 2014.  She also had a TIA in 2014.  She has been on chronic anticoagulation with Xarelto.  Echocardiogram in 2014 showed preserved LV function with an EF of 55 to 60%.  Other medical issues include treated dyslipidemia and history of carotid and subclavian disease.  She has had mild carotid stenosis in the past with a 1 to 40% bilateral internal carotid artery narrowing. She also had some left subclavian artery narrowing noted.   She had carotid dopplers today. No significant carotid stenosis noted. Some subclavian stenosis with 20 mm Hg difference in arm pressures. She noted that for the past 2 weeks she has had left shoulder pain. She is concerned about her risk of heart attack. She reports pain is intermittent. Doesn't appear to be related to activity or movement. No associated chest pain or dyspnea. No clear alleviating factors. She is under a lot of family stress. Complains of chronic insomnia. She was very active until Covid hit- now just doing some walking. She has a strong family history of CAD. No prior cardiac ischemia work up.  Past Medical History:  Diagnosis Date  . Allergy   . Arthritis    hands  . Asthma   . Atrial fibrillation ([place])   . Atrial flutter ([place])   . Depression   . Dysrhythmia    a-fib, on Xarelto  . GERD (gastroesophageal reflux disease)   . Hyperlipidemia   . Hypertension   . Hypothyroidism   . Osteoporosis   . TIA (transient ischemic attack)     Past Surgical History:  Procedure Laterality Date  . ABDOMINAL HYSTERECTOMY    . COLONOSCOPY    . MYRINGOTOMY WITH TUBE  PLACEMENT Right 01/14/2016   Procedure: RIGHT MYRINGOTOMY WITH T-TUBE PLACEMENT;  Surgeon: Leta Baptist, MD;  Location: [facility];  Service: ENT;  Laterality: Right;  . PARTIAL HYSTERECTOMY  1989  . TEE WITHOUT CARDIOVERSION N/A 12/22/2012   Procedure: TRANSESOPHAGEAL ECHOCARDIOGRAM (TEE);  Surgeon: Lelon Perla, MD;  Location: [facility] ENDOSCOPY;  Service: Cardiovascular;  Laterality: N/A;     Current Outpatient Medications  Medication Sig Dispense Refill  . Alpha-D-Galactosidase (BEANO PO) Take by mouth 2 (two) times daily.    Marland Kitchen atorvastatin (LIPITOR) 20 MG tablet Take 1 tablet (20 mg total) by mouth daily. 90 tablet 1  . Bepotastine Besilate (BEPREVE) 1.5 % SOLN Use one drop in each eye twice a day    . bumetanide (BUMEX) 1 MG tablet Take 1 tablet (1 mg total) by mouth 2 (two) times daily. 180 tablet 1  . buPROPion (WELLBUTRIN XL) 150 MG 24 hr tablet TAKE 1 TABLET BY MOUTH DAILY. 90 tablet 1  . Calcium Carbonate-Vitamin D (CALCIUM 600/VITAMIN D) 600-400 MG-UNIT per tablet Take 1 tablet by mouth 2 (two) times daily.      . Cholecalciferol (VITAMIN D-3) 5000 UNITS TABS Take one tablet by mouth three times a week    . clonazePAM (KLONOPIN) 0.5 MG tablet TAKE 1 TABLET BY MOUTH AT BEDTIME AS  NEEDED FOR ANXIETY 30 tablet 0  . denosumab (PROLIA) 60 MG/ML SOLN injection Inject 60 mg into the skin once. Administer in upper arm, thigh, or abdomen 180 mL 0  . diclofenac sodium (VOLTAREN) 1 % GEL Apply 2g to affected area qid prn 100 g 3  . famotidine (PEPCID AC) 10 MG chewable tablet Chew 1 tablet (10 mg total) by mouth daily. 90 tablet prm  . Fluticasone-Salmeterol (ADVAIR DISKUS) 100-50 MCG/DOSE AEPB Inhale 1 puff into the lungs 2 (two) times daily. 180 each 1  . levocetirizine (XYZAL) 5 MG tablet Take 5 mg by mouth daily.  1  . levothyroxine (SYNTHROID) 50 MCG tablet Take 1 tablet (50 mcg total) by mouth daily. 90 tablet 1  . metoprolol succinate (TOPROL-XL) 25 MG 24 hr tablet Take 3  tablets (75 mg total) by mouth daily. 270 tablet 1  . NEXIUM 40 MG capsule TAKE 1 CAPSULE BY MOUTH DAILY. 90 capsule 1  . nystatin (MYCOSTATIN) 100000 UNIT/ML suspension SWISH BY MOUTH 10 ML UP TO 4 TIMES A DAY 240 mL 1  . potassium chloride (K-DUR) 10 MEQ tablet TAKE 1 TABLET BY MOUTH 2 TIMES DAILY. 180 tablet 1  . Suvorexant (BELSOMRA) 15 MG TABS Take 1 tablet by mouth at bedtime. 30 tablet 3  . XARELTO 20 MG TABS tablet TAKE 1 TABLET BY MOUTH DAILY. 90 tablet 1   No current facility-administered medications for this visit.     Allergies:   Patient has no known allergies.    Social History:  The patient  reports that she has never smoked. She has never used smokeless tobacco. She reports current alcohol use. She reports that she does not use drugs.   Family History:  The patient's family history includes Breast cancer in her mother; Depression in her son; Diabetes in her maternal grandfather; Drug abuse in her son; Heart disease in her paternal grandfather; Stroke in her paternal aunt.    ROS:  Please see the history of present illness.   Otherwise, review of systems are positive for none.   All other systems are reviewed and negative.    PHYSICAL EXAM: VS:  BP (!) 164/82   Pulse 62   Temp (!) 97 F (36.1 C) (Temporal)   Ht 5\' 5"  (1.651 m)   Wt 138 lb (62.6 kg)   SpO2 98%   BMI 22.96 kg/m  , BMI Body mass index is 22.96 kg/m. GEN: Well nourished, well developed, in no acute distress  HEENT: normal  Neck: no JVD, left carotid and subclavian bruit, or masses Cardiac: RRR; no murmurs, rubs, or gallops,no edema  Respiratory:  clear to auscultation bilaterally, normal work of breathing GI: soft, nontender, nondistended, + BS MS: no deformity or atrophy  Skin: warm and dry, no rash Neuro:  Strength and sensation are intact Psych: euthymic mood, full affect   EKG:  EKG is ordered today. The ekg ordered today demonstrates sinus brady rate 54. PAC. Normal. I have personally  reviewed and interpreted this study.    Recent Labs: 05/23/2019: TSH 1.57 07/06/2019: ALT 13; BUN 15; Creatinine, Ser 0.97; Hemoglobin 13.4; Platelets 335.0; Potassium 3.3; Sodium 139    Lipid Panel    Component Value Date/Time   CHOL 161 07/06/2019 0954   TRIG 157.0 (H) 07/06/2019 0954   HDL 38.10 (L) 07/06/2019 0954   CHOLHDL 4 07/06/2019 0954   VLDL 31.4 07/06/2019 0954   LDLCALC 92 07/06/2019 0954   LDLDIRECT 129.2 05/28/2007 [id number]  Wt Readings from Last 3 Encounters:  08/17/19 138 lb (62.6 kg)  08/02/19 136 lb (61.7 kg)  07/06/19 132 lb 9.6 oz (60.1 kg)      Other studies Reviewed: Additional studies/ records that were reviewed today include: none   ASSESSMENT AND PLAN:  1.  Left shoulder pain. Recent onset. I suspect this is more musculoskeletal but could be anginal equivalent. She has multiple cardiac risk factors and no prior ischemic evaluation. Will arrange for a coronary CTA. I doubt her subclavian stenosis is causing her pain with only a 20 mm Hg pressure gradient.  2. PAD with left subclavian stenosis 3. PAfib 4. HTN controlled 5. HLD on statin. 6. Family hx of CAD   Current medicines are reviewed at length with the patient today.  The patient does not have concerns regarding medicines.  The following changes have been made:  no change  Labs/ tests ordered today include:   Orders Placed This Encounter  Procedures  . CT CORONARY MORPH W/CTA COR W/SCORE W/CA W/CM &/OR WO/CM  . CT CORONARY FRACTIONAL FLOW RESERVE DATA PREP  . CT CORONARY FRACTIONAL FLOW RESERVE FLUID ANALYSIS  . Basic metabolic panel  . EKG 12-Lead     Disposition:   FU with Dr Stanford Breed   Signed, Evelyne Makepeace Martinique, MD  08/17/2019 10:35 AM    [facility] [address] [place], [place], [address] Phone [phone], Fax [phone]

## 2019-08-19 LAB — BASIC METABOLIC PANEL
BUN/Creatinine Ratio: 13 (ref 12–28)
BUN: 14 mg/dL (ref 8–27)
CO2: 26 mmol/L (ref 20–29)
Calcium: 9.7 mg/dL (ref 8.7–10.3)
Chloride: 99 mmol/L (ref 96–106)
Creatinine, Ser: 1.05 mg/dL — ABNORMAL HIGH (ref 0.57–1.00)
GFR calc Af Amer: 60 mL/min/{1.73_m2} (ref >59)
GFR calc non Af Amer: 52 mL/min/{1.73_m2} — ABNORMAL LOW (ref >59)
Glucose: 83 mg/dL (ref 65–99)
Potassium: 4.3 mmol/L (ref 3.5–5.2)
Sodium: 138 mmol/L (ref 134–144)

## 2019-08-25 ENCOUNTER — Other Ambulatory Visit: Payer: Self-pay | Admitting: Cardiology

## 2019-08-25 ENCOUNTER — Other Ambulatory Visit: Payer: Self-pay | Admitting: Family Medicine

## 2019-08-25 ENCOUNTER — Telehealth (HOSPITAL_COMMUNITY): Payer: Self-pay | Admitting: Emergency Medicine

## 2019-08-25 DIAGNOSIS — I480 Paroxysmal atrial fibrillation: Secondary | ICD-10-CM

## 2019-08-25 NOTE — Telephone Encounter (Signed)
Reaching out to patient to offer assistance regarding upcoming cardiac imaging study; pt verbalizes understanding of appt date/time, parking situation and where to check in, pre-test NPO status and medications ordered, and verified current allergies; name and call back number provided for further questions should they arise Patricia Heslin RN Navigator Cardiac Imaging [facility] [phone] office [phone] cell 

## 2019-08-25 NOTE — Telephone Encounter (Signed)
Last fill 07/12/19  #30/0 Last OV 07/06/19

## 2019-08-26 ENCOUNTER — Other Ambulatory Visit: Payer: Self-pay

## 2019-08-26 ENCOUNTER — Ambulatory Visit (HOSPITAL_COMMUNITY)
Admission: RE | Admit: 2019-08-26 | Discharge: 2019-08-26 | Disposition: A | Discharge status: Home / Self Care | Payer: Medicare Other | Source: Ambulatory Visit | Attending: Cardiology | Admitting: Cardiology

## 2019-08-26 DIAGNOSIS — R079 Chest pain, unspecified: Secondary | ICD-10-CM | POA: Insufficient documentation

## 2019-08-26 DIAGNOSIS — K449 Diaphragmatic hernia without obstruction or gangrene: Secondary | ICD-10-CM | POA: Diagnosis not present

## 2019-08-26 DIAGNOSIS — I700 Atherosclerosis of aorta: Secondary | ICD-10-CM | POA: Diagnosis not present

## 2019-08-26 MED ORDER — NITROGLYCERIN 0.4 MG SL SUBL
0.8000 mg | SUBLINGUAL_TABLET | Freq: Once | SUBLINGUAL | Status: AC
  Administered 2019-08-26: 09:00:00 0.8 mg via SUBLINGUAL

## 2019-08-26 MED ORDER — NITROGLYCERIN 0.4 MG SL SUBL
SUBLINGUAL_TABLET | SUBLINGUAL | Status: AC
  Filled 2019-08-26: qty 2

## 2019-08-26 MED ORDER — IOHEXOL 350 MG/ML SOLN
80.0000 mL | Freq: Once | INTRAVENOUS | Status: AC | PRN
  Administered 2019-08-26: 80 mL via INTRAVENOUS

## 2019-09-09 LAB — BASIC METABOLIC PANEL
BUN/Creatinine Ratio: 17 (ref 12–28)
BUN: 19 mg/dL (ref 8–27)
CO2: 24 mmol/L (ref 20–29)
Calcium: 9.8 mg/dL (ref 8.7–10.3)
Chloride: 100 mmol/L (ref 96–106)
Creatinine, Ser: 1.1 mg/dL — ABNORMAL HIGH (ref 0.57–1.00)
GFR calc Af Amer: 57 mL/min/{1.73_m2} — ABNORMAL LOW (ref >59)
GFR calc non Af Amer: 50 mL/min/{1.73_m2} — ABNORMAL LOW (ref >59)
Glucose: 70 mg/dL (ref 65–99)
Potassium: 4.1 mmol/L (ref 3.5–5.2)
Sodium: 141 mmol/L (ref 134–144)

## 2019-09-13 ENCOUNTER — Ambulatory Visit: Payer: Medicare Other

## 2019-09-14 ENCOUNTER — Ambulatory Visit: Payer: Medicare Other

## 2019-09-14 ENCOUNTER — Encounter: Payer: Self-pay | Admitting: Cardiovascular Disease

## 2019-09-14 ENCOUNTER — Ambulatory Visit (INDEPENDENT_AMBULATORY_CARE_PROVIDER_SITE_OTHER): Payer: Medicare Other | Admitting: Cardiovascular Disease

## 2019-09-14 ENCOUNTER — Other Ambulatory Visit: Payer: Self-pay

## 2019-09-14 DIAGNOSIS — I771 Stricture of artery: Secondary | ICD-10-CM | POA: Insufficient documentation

## 2019-09-14 NOTE — Assessment & Plan Note (Addendum)
Patricia Peck was referred to me by Dr. Martinique for evaluation treatment of left subclavian artery stenosis.  She is a cardiology patient of Dr. Jacalyn Lefevre.  She was seen by Dr. Martinique as an add on 08/17/2019 because of left shoulder pain.  She says she has had this for several weeks.  Dr. Martinique was not convinced that this was ischemically mediated but thought it could be atypical presentation of angina and ordered a coronary CTA which is yet to be done.  Carotid Doppler studies performed 08/17/2019 revealed mild elevation in her left subclavian velocities of 165 with antegrade vertebral blood flow and a 20 mm blood pressure difference across her upper extremities.  She says her arm hurts when she lifts her shoulder above above horizontal.  She does have a bruit in her left subclavian area.  I suspect her symptoms are more musculoskeletal.  I will revisit this in 3 months and if she still having symptoms we will obtain a CTA of her upper extremities to further evaluate.  On exam today her blood pressure in her right arm was 179/84, and her left arm 154/89 suggesting a 25 mm blood pressure differential.

## 2019-09-14 NOTE — Progress Notes (Signed)
09/14/2019 [name]   1946/05/26  [id number]  Primary Physician Lucille Passy, MD Primary Cardiologist: Lorretta Harp MD Lupe Carney, [place]  HPI:  Patricia Peck is a 73 y.o. thin appearing married Caucasian female mother of 2 children who is retired from working in the [employer] school system.  She is referred by Dr. 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  She is a cardiology patient of Dr. Jacalyn Lefevre.  She does have a history of treated hypertension and hyperlipidemia as well as PAF on oral anticoagulation.  She is never had a heart attack but has had TIAs in the past.  She denies chest pain or shortness of breath.  She has had shoulder pain on the left side for the last several weeks and saw Dr. Martinique as an add on 08/17/2019 for this.  He felt the sound more musculoskeletal but did order a coronary CTA which is yet to be performed to rule out an atypical anginal presentation.  She says her arm hurts her when she lifts it above midline especially in the deltoid and bicep area.  She does have a left subclavian bruit on exam and a 25 mm blood pressure differential across her upper extremities.  Her current carotid Doppler study performed 08/17/2019 only showed mild increase in velocities in the left subclavian artery with antegrade vertebral blood flow.   Current Meds  Medication Sig  . Alpha-D-Galactosidase (BEANO PO) Take by mouth 2 (two) times daily.  Marland Kitchen atorvastatin (LIPITOR) 20 MG tablet Take 1 tablet (20 mg total) by mouth daily.  . Bepotastine Besilate (BEPREVE) 1.5 % SOLN Use one drop in each eye twice a day  . bumetanide (BUMEX) 1 MG tablet Take 1 tablet (1 mg total) by mouth 2 (two) times daily.  Marland Kitchen buPROPion (WELLBUTRIN XL) 150 MG 24 hr tablet TAKE 1 TABLET BY MOUTH DAILY.  . Calcium Carbonate-Vitamin D (CALCIUM 600/VITAMIN D) 600-400 MG-UNIT per tablet Take 1 tablet by mouth 2 (two) times daily.    .  Cholecalciferol (VITAMIN D-3) 5000 UNITS TABS Take one tablet by mouth three times a week  . clonazePAM (KLONOPIN) 0.5 MG tablet TAKE 1 TABLET BY MOUTH AT BEDTIME AS NEEDED FOR ANXIETY  . denosumab (PROLIA) 60 MG/ML SOLN injection Inject 60 mg into the skin once. Administer in upper arm, thigh, or abdomen  . diclofenac sodium (VOLTAREN) 1 % GEL Apply 2g to affected area qid prn  . famotidine (PEPCID AC) 10 MG chewable tablet Chew 1 tablet (10 mg total) by mouth daily.  . Fluticasone-Salmeterol (ADVAIR DISKUS) 100-50 MCG/DOSE AEPB Inhale 1 puff into the lungs 2 (two) times daily.  Marland Kitchen levocetirizine (XYZAL) 5 MG tablet Take 5 mg by mouth daily.  Marland Kitchen levothyroxine (SYNTHROID) 50 MCG tablet Take 1 tablet (50 mcg total) by mouth daily.  . metoprolol succinate (TOPROL-XL) 25 MG 24 hr tablet Take 3 tablets (75 mg total) by mouth daily.  Marland Kitchen NEXIUM 40 MG capsule TAKE 1 CAPSULE BY MOUTH DAILY.  Marland Kitchen nystatin (MYCOSTATIN) 100000 UNIT/ML suspension SWISH BY MOUTH 10 ML UP TO 4 TIMES A DAY  . potassium chloride (K-DUR) 10 MEQ tablet TAKE 1 TABLET BY MOUTH 2 TIMES DAILY.  . Suvorexant (BELSOMRA) 15 MG TABS Take 1 tablet by mouth at bedtime.  Alveda Reasons 20 MG TABS tablet TAKE 1 TABLET BY MOUTH DAILY.     No Known Allergies  Social History   Socioeconomic History  .  Marital status: Married    Spouse name: Not on file  . Number of children: 2  . Years of education: Not on file  . Highest education level: Not on file  Occupational History    Employer: RETIRED  Social Needs  . Financial resource strain: Not on file  . Food insecurity    Worry: Not on file    Inability: Not on file  . Transportation needs    Medical: Not on file    Non-medical: Not on file  Tobacco Use  . Smoking status: Never Smoker  . Smokeless tobacco: Never Used  Substance and Sexual Activity  . Alcohol use: Yes    Comment: Rare  . Drug use: No  . Sexual activity: Yes  Lifestyle  . Physical activity    Days per week: Not on  file    Minutes per session: Not on file  . Stress: Not on file  Relationships  . Social Herbalist on phone: Not on file    Gets together: Not on file    Attends religious service: Not on file    Active member of club or organization: Not on file    Attends meetings of clubs or organizations: Not on file    Relationship status: Not on file  . Intimate partner violence    Fear of current or ex partner: Not on file    Emotionally abused: Not on file    Physically abused: Not on file    Forced sexual activity: Not on file  Other Topics Concern  . Not on file  Social History Narrative   Does have HPOA- sons Eddie Dibbles and Wille Glaser.   Does desire life support if not futile.   Not sure about feeding tubes.     Review of Systems: General: negative for chills, fever, night sweats or weight changes.  Cardiovascular: negative for chest pain, dyspnea on exertion, edema, orthopnea, palpitations, paroxysmal nocturnal dyspnea or shortness of breath Dermatological: negative for rash Respiratory: negative for cough or wheezing Urologic: negative for hematuria Abdominal: negative for nausea, vomiting, diarrhea, bright red blood per rectum, melena, or hematemesis Neurologic: negative for visual changes, syncope, or dizziness All other systems reviewed and are otherwise negative except as noted above.    Blood pressure (!) 172/86, pulse (!) 54, height 5\' 4"  (1.626 m), weight 142 lb (64.4 kg), SpO2 98 %.  General appearance: alert and no distress Neck: no adenopathy, no JVD, supple, symmetrical, trachea midline, thyroid not enlarged, symmetric, no tenderness/mass/nodules and Bilateral carotid bruits and left subclavian bruit Lungs: clear to auscultation bilaterally Heart: regular rate and rhythm, S1, S2 normal, no murmur, click, rub or gallop Extremities: extremities normal, atraumatic, no cyanosis or edema Pulses: 2+ and symmetric Skin: Skin color, texture, turgor normal. No rashes or lesions  Neurologic: Alert and oriented X 3, normal strength and tone. Normal symmetric reflexes. Normal coordination and gait  EKG not performed today  ASSESSMENT AND PLAN:   Stenosis of left subclavian artery ([place]) Ms. Roesner was referred to me by Dr. Martinique for evaluation treatment of left subclavian artery stenosis.  She is a cardiology patient of Dr. Jacalyn Lefevre.  She was seen by Dr. Martinique as an add on 08/17/2019 because of left shoulder pain.  She says she has had this for several weeks.  Dr. Martinique was not convinced that this was ischemically mediated but thought it could be atypical presentation of angina and ordered a coronary CTA which is yet to be done.  Carotid Doppler studies performed 08/17/2019 revealed mild elevation in her left subclavian velocities of 165 with antegrade vertebral blood flow and a 20 mm blood pressure difference across her upper extremities.  She says her arm hurts when she lifts her shoulder above above horizontal.  She does have a bruit in her left subclavian area.  I suspect her symptoms are more musculoskeletal.  I will revisit this in 3 months and if she still having symptoms we will obtain a CTA of her upper extremities to further evaluate.      Lorretta Harp MD FACP,FACC,FAHA, [facility] 09/14/2019 10:26 AM

## 2019-09-14 NOTE — Patient Instructions (Addendum)
Medication Instructions:  Your physician recommends that you continue on your current medications as directed. Please refer to the Current Medication list given to you today.  If you need a refill on your cardiac medications before your next appointment, please call your pharmacy.   Lab work: NONE  Testing/Procedures: NONE  Follow-Up: At [facility], you and your health needs are our priority.  As part of our continuing mission to provide you with exceptional heart care, we have created designated Provider Care Teams.  These Care Teams include your primary Cardiologist (physician) and Advanced Practice Providers (APPs -  Physician Assistants and Nurse Practitioners) who all work together to provide you with the care you need, when you need it. You may see Dr Gwenlyn Found or one of the following Advanced Practice Providers on your designated Care Team:    Kerin Ransom, PA-C  [place], [place]  Coletta Memos, [place]   Your physician wants you to follow-up in: 3 months. Follow-up appointment on February 16th @ 10:45am

## 2019-09-17 DIAGNOSIS — E785 Hyperlipidemia, unspecified: Secondary | ICD-10-CM | POA: Insufficient documentation

## 2019-09-17 DIAGNOSIS — E7800 Pure hypercholesterolemia, unspecified: Secondary | ICD-10-CM | POA: Insufficient documentation

## 2019-10-03 ENCOUNTER — Other Ambulatory Visit: Payer: Self-pay | Admitting: Family Medicine

## 2019-11-10 ENCOUNTER — Telehealth: Payer: Self-pay | Admitting: Family Medicine

## 2019-11-10 NOTE — Telephone Encounter (Signed)
Patient is calling and wanted to get her Prolia injection. CB is [phone]

## 2019-11-10 NOTE — Telephone Encounter (Signed)
Patient is calling and requested a referral to have a sleep study. CB is [phone]

## 2019-11-10 NOTE — Telephone Encounter (Signed)
Nurse visit appointment has been scheduled for 11/16/2019 at 10 AM for Prolia injection.

## 2019-11-10 NOTE — Telephone Encounter (Signed)
Virtual appointment has been scheduled for 11/15/2019 at 12:40 PM w/ PCP to discuss the below information & current symptoms.

## 2019-11-14 NOTE — Progress Notes (Signed)
Virtual Visit via Video   Due to the COVID-19 pandemic, this visit was completed with telemedicine (audio/video) technology to reduce patient and provider exposure as well as to preserve personal protective equipment.   I connected with Patricia Peck by a video enabled telemedicine application and verified that I am speaking with the correct person using two identifiers. Location patient: Home Location provider: [facility], Office Persons participating in the virtual visit: ASTASIA NEUWIRTH, Arnette Norris, MD   I discussed the limitations of evaluation and management by telemedicine and the availability of in person appointments. The patient expressed understanding and agreed to proceed.  Interactive audio and video telecommunications were attempted between this provider and patient, however failed, due to patient having technical difficulties OR patient did not have access to video capability.  We continued and completed visit with audio only.  Care Team   Patient Care Team: Lucille Passy, MD as PCP - General Stanford Breed Denice Bors, MD as PCP - Cardiology (Cardiology) Stanford Breed Denice Bors, MD as Consulting Physician (Cardiology) Leta Baptist, MD as Consulting Physician (Otolaryngology) Mosetta Anis, MD as Referring Physician (Allergy)  Subjective:   HPI:   Patient agrees to virtual visit to follow up with insomnia.  Persistent issue for her.  Was last seen for this on 07/06/19, note reviewed- increased Belsomra to 15 mg nightly.  I had previously seen her for this on 05/23/19:   Note reviewed. At that [place], she was supposed to be takingMelatonin and trazodone 150 mg qhs.But she has been taking trazodone up to 300 mg qhs ( she increased dosage on her own) and melatonin 20 mg. Clonazepam also ineffective. Had been taking this dose of trazodone for almost a year but I was unaware.  After a long discussion, we agreed to wean her off of trazodone and start Belsomra 10 mg night. Also  advised to take a two week break from melatonin.     Review of Systems  Constitutional: Negative for fever and malaise/fatigue.  HENT: Negative for congestion and hearing loss.   Eyes: Negative for blurred vision, discharge and redness.  Respiratory: Negative for cough and shortness of breath.   Cardiovascular: Negative for chest pain, palpitations and leg swelling.  Gastrointestinal: Negative for abdominal pain and heartburn.  Genitourinary: Negative for dysuria.  Musculoskeletal: Negative for falls.  Skin: Negative for rash.  Neurological: Negative for loss of consciousness and headaches.  Endo/Heme/Allergies: Does not bruise/bleed easily.  Psychiatric/Behavioral: Negative for depression, hallucinations, memory loss, substance abuse and suicidal ideas. The patient is nervous/anxious and has insomnia.   All other systems reviewed and are negative.    Patient Active Problem List   Diagnosis Date Noted  . 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 History of TIA (transient ischemic attack) 08/01/2019  . Anticoagulated 08/01/2019  . Hyperlipidemia   . Situational anxiety 05/23/2019  . IBS (irritable bowel syndrome) 04/11/2013  . PAF (paroxysmal atrial fibrillation) ([place]) 01/27/2013  . Cerebrovascular disease 01/27/2013  . Insomnia 11/02/2012  . GERD 03/12/2010  . Hypothyroidism 04/14/2007  . Depression 04/14/2007  . Essential hypertension 04/14/2007  . Osteoporosis 04/14/2007    Social History   Tobacco Use  . Smoking status: Never Smoker  . Smokeless tobacco: Never Used  Substance Use Topics  . Alcohol use: Yes    Comment: Rare    Current Outpatient Medications:  .  Alpha-D-Galactosidase (BEANO PO), Take by mouth 2 (two) times daily., Disp: , Rfl:  .  atorvastatin (LIPITOR) 20 MG  tablet, Take 1 tablet (20 mg total) by mouth daily., Disp: 90 tablet, Rfl: 1 .  Bepotastine Besilate (BEPREVE) 1.5 % SOLN, Use one drop in each eye twice a day, Disp: , Rfl:  .   bumetanide (BUMEX) 1 MG tablet, Take 1 tablet (1 mg total) by mouth 2 (two) times daily., Disp: 180 tablet, Rfl: 1 .  buPROPion (WELLBUTRIN XL) 150 MG 24 hr tablet, TAKE 1 TABLET BY MOUTH DAILY., Disp: 90 tablet, Rfl: 1 .  Calcium Carbonate-Vitamin D (CALCIUM 600/VITAMIN D) 600-400 MG-UNIT per tablet, Take 1 tablet by mouth 2 (two) times daily.  , Disp: , Rfl:  .  Cholecalciferol (VITAMIN D-3) 5000 UNITS TABS, Take one tablet by mouth three times a week, Disp: , Rfl:  .  clonazePAM (KLONOPIN) 0.5 MG tablet, TAKE 1 TABLET BY MOUTH AT BEDTIME AS NEEDED FOR ANXIETY, Disp: 30 tablet, Rfl: 0 .  denosumab (PROLIA) 60 MG/ML SOLN injection, Inject 60 mg into the skin once. Administer in upper arm, thigh, or abdomen, Disp: 180 mL, Rfl: 0 .  diclofenac sodium (VOLTAREN) 1 % GEL, Apply 2g to affected area qid prn, Disp: 100 g, Rfl: 3 .  famotidine (PEPCID AC) 10 MG chewable tablet, Chew 1 tablet (10 mg total) by mouth daily., Disp: 90 tablet, Rfl: prm .  Fluticasone-Salmeterol (ADVAIR DISKUS) 100-50 MCG/DOSE AEPB, Inhale 1 puff into the lungs 2 (two) times daily., Disp: 180 each, Rfl: 1 .  levocetirizine (XYZAL) 5 MG tablet, Take 5 mg by mouth daily., Disp: , Rfl: 1 .  levothyroxine (SYNTHROID) 50 MCG tablet, Take 1 tablet (50 mcg total) by mouth daily., Disp: 90 tablet, Rfl: 1 .  metoprolol succinate (TOPROL-XL) 25 MG 24 hr tablet, Take 3 tablets (75 mg total) by mouth daily., Disp: 270 tablet, Rfl: 1 .  NEXIUM 40 MG capsule, TAKE 1 CAPSULE BY MOUTH DAILY., Disp: 90 capsule, Rfl: 1 .  nystatin (MYCOSTATIN) 100000 UNIT/ML suspension, SWISH BY MOUTH 10 ML UP TO 4 TIMES A DAY, Disp: 240 mL, Rfl: 1 .  potassium chloride (K-DUR) 10 MEQ tablet, TAKE 1 TABLET BY MOUTH 2 TIMES DAILY., Disp: 180 tablet, Rfl: 1 .  XARELTO 20 MG TABS tablet, TAKE 1 TABLET BY MOUTH DAILY., Disp: 90 tablet, Rfl: 0  No Known Allergies  Objective:  There were no vitals taken for this visit.  VITALS: Per patient if applicable, see  vitals. GENERAL: Alert, appears well and in no acute distress. HEENT: Atraumatic, conjunctiva clear, no obvious abnormalities on inspection of external nose and ears. NECK: Normal movements of the head and neck. CARDIOPULMONARY: No increased WOB. Speaking in clear sentences. I:E ratio WNL.  MS: Moves all visible extremities without noticeable abnormality. PSYCH: Pleasant and cooperative, well-groomed. Speech normal rate and rhythm. Affect is appropriate. Insight and judgement are appropriate. Attention is focused, linear, and appropriate.  NEURO: CN grossly intact. Oriented as arrived to appointment on time with no prompting. Moves both UE equally.  SKIN: No obvious lesions, wounds, erythema, or cyanosis noted on face or hands.  Depression screen [facility] 2/9 07/06/2019 05/23/2019 05/18/2019  Decreased Interest 0 0 0  Down, Depressed, Hopeless 0 0 0  PHQ - 2 Score 0 0 0  Altered sleeping 1 - -  Tired, decreased energy 1 - -  Change in appetite 0 - -  Feeling bad or failure about yourself  0 - -  Trouble concentrating 0 - -  Moving slowly or fidgety/restless 0 - -  Suicidal thoughts 0 - -  PHQ-9 Score 2 - -  Difficult doing work/chores Somewhat difficult - -  Some recent data might be hidden     . COVID-19 Education: The signs and symptoms of COVID-19 were discussed with the patient and how to seek care for testing if needed. The importance of social distancing was discussed today. . Reviewed expectations re: course of current medical issues. . Discussed self-management of symptoms. . Outlined signs and symptoms indicating need for more acute intervention. . Patient verbalized understanding and all questions were answered. Marland Kitchen Health Maintenance issues including appropriate healthy diet, exercise, and smoking avoidance were discussed with patient. . See orders for this visit as documented in the electronic medical record.  Arnette Norris, MD  Records requested if needed. Time spent: 25 minutes,  of which >50% was spent in obtaining information about her symptoms, reviewing her previous labs, evaluations, and treatments, counseling her about her condition (please see the discussed topics above), and developing a plan to further investigate it; she had a number of questions which I addressed.   Lab Results  Component Value Date   WBC 9.8 07/06/2019   HGB 13.4 07/06/2019   HCT 39.6 07/06/2019   PLT 335.0 07/06/2019   GLUCOSE 70 09/09/2019   CHOL 161 07/06/2019   TRIG 157.0 (H) 07/06/2019   HDL 38.10 (L) 07/06/2019   LDLDIRECT 129.2 05/28/2007   LDLCALC 92 07/06/2019   ALT 13 07/06/2019   AST 13 07/06/2019   NA 141 09/09/2019   K 4.1 09/09/2019   CL 100 09/09/2019   CREATININE 1.10 (H) 09/09/2019   BUN 19 09/09/2019   CO2 24 09/09/2019   TSH 1.57 05/23/2019   HGBA1C 5.7 09/07/2017    Lab Results  Component Value Date   TSH 1.57 05/23/2019   Lab Results  Component Value Date   WBC 9.8 07/06/2019   HGB 13.4 07/06/2019   HCT 39.6 07/06/2019   MCV 87.7 07/06/2019   PLT 335.0 07/06/2019   Lab Results  Component Value Date   NA 141 09/09/2019   K 4.1 09/09/2019   CO2 24 09/09/2019   GLUCOSE 70 09/09/2019   BUN 19 09/09/2019   CREATININE 1.10 (H) 09/09/2019   BILITOT 0.5 07/06/2019   ALKPHOS 85 07/06/2019   AST 13 07/06/2019   ALT 13 07/06/2019   PROT 7.2 07/06/2019   ALBUMIN 4.1 07/06/2019   CALCIUM 9.8 09/09/2019   GFR 56.13 (L) 07/06/2019   Lab Results  Component Value Date   CHOL 161 07/06/2019   Lab Results  Component Value Date   HDL 38.10 (L) 07/06/2019   Lab Results  Component Value Date   LDLCALC 92 07/06/2019   Lab Results  Component Value Date   TRIG 157.0 (H) 07/06/2019   Lab Results  Component Value Date   CHOLHDL 4 07/06/2019   Lab Results  Component Value Date   HGBA1C 5.7 09/07/2017       Assessment & Plan:   Problem List Items Addressed This Visit      Active Problems   Insomnia - Primary    History: Associated  symptoms include anxiety, daytime somnolence, fatigue, frequent nighttime urination and stress. Average duration of sleep 4 hours/night with 3 awakenings/night. The patient has been taking: only extended release melatonin because nothing else has worked. Side effects from the medication: none. Assessment/Plan: 1. Discussed sleep hygiene measures including regular sleep schedule, optimal sleep environment, and relaxing presleep rituals. 2. Avoid daytime naps. 3. Avoid caffeine after noon. 4. Avoid excess  alcohol. 5. Avoid tobacco. 6. Recommended daily exercise. 7. Scientist, clinical (histocompatibility and immunogenetics) distributed. 8. Refer to pulmonary for home sleep study.       Relevant Orders   Ambulatory referral to Pulmonology      I have discontinued Amayla Heitner. Hallam "Jan"'s Belsomra and Belsomra. I am also having her maintain her Calcium Carbonate-Vitamin D, Vitamin D-3, Bepotastine Besilate, Alpha-D-Galactosidase (BEANO PO), denosumab, levocetirizine, nystatin, famotidine, diclofenac sodium, potassium chloride, buPROPion, NexIUM, atorvastatin, bumetanide, Fluticasone-Salmeterol, levothyroxine, metoprolol succinate, clonazePAM, and Xarelto.  No orders of the defined types were placed in this encounter.    Arnette Norris, MD

## 2019-11-15 ENCOUNTER — Telehealth (INDEPENDENT_AMBULATORY_CARE_PROVIDER_SITE_OTHER): Payer: Medicare PPO | Admitting: Family Medicine

## 2019-11-15 ENCOUNTER — Other Ambulatory Visit: Payer: Self-pay

## 2019-11-15 DIAGNOSIS — J4530 Mild persistent asthma, uncomplicated: Secondary | ICD-10-CM | POA: Diagnosis not present

## 2019-11-15 DIAGNOSIS — H1045 Other chronic allergic conjunctivitis: Secondary | ICD-10-CM | POA: Diagnosis not present

## 2019-11-15 DIAGNOSIS — J300 Vasomotor rhinitis: Secondary | ICD-10-CM | POA: Diagnosis not present

## 2019-11-15 DIAGNOSIS — H6990 Unspecified Eustachian tube disorder, unspecified ear: Secondary | ICD-10-CM | POA: Diagnosis not present

## 2019-11-15 DIAGNOSIS — G4709 Other insomnia: Secondary | ICD-10-CM

## 2019-11-15 MED ORDER — NEXIUM 40 MG PO CPDR: 40.0000 mg | DELAYED_RELEASE_CAPSULE | Freq: Every day | ORAL | 1 refills | Status: DC

## 2019-11-15 NOTE — Assessment & Plan Note (Addendum)
History: Associated symptoms include anxiety, daytime somnolence, fatigue, frequent nighttime urination and stress. Average duration of sleep 4 hours/night with 3 awakenings/night. The patient has been taking: only extended release melatonin because nothing else has worked.  Has tried numerous medications, including but not excluded to trazodone, klonipin, melatonin. Side effects from the medication: none. Assessment/Plan: 1. Discussed sleep hygiene measures including regular sleep schedule, optimal sleep environment, and relaxing presleep rituals. 2. Avoid daytime naps. 3. Avoid caffeine after noon. 4. Avoid excess alcohol. 5. Avoid tobacco. 6. Recommended daily exercise. 7. Scientist, clinical (histocompatibility and immunogenetics) distributed. 8. Refer to pulmonary for home sleep study.

## 2019-11-16 ENCOUNTER — Ambulatory Visit (INDEPENDENT_AMBULATORY_CARE_PROVIDER_SITE_OTHER): Payer: Medicare PPO

## 2019-11-16 DIAGNOSIS — M810 Age-related osteoporosis without current pathological fracture: Secondary | ICD-10-CM | POA: Diagnosis not present

## 2019-11-16 MED ORDER — DENOSUMAB 60 MG/ML ~~LOC~~ SOSY
60.0000 mg | PREFILLED_SYRINGE | Freq: Once | SUBCUTANEOUS | Status: AC
  Administered 2019-11-16: 60 mg via SUBCUTANEOUS

## 2019-11-16 NOTE — Patient Instructions (Signed)
There are no preventive care reminders to display for this patient.  Depression screen [facility] 2/9 07/06/2019 05/23/2019 05/18/2019  Decreased Interest 0 0 0  Down, Depressed, Hopeless 0 0 0  PHQ - 2 Score 0 0 0  Altered sleeping 1 - -  Tired, decreased energy 1 - -  Change in appetite 0 - -  Feeling bad or failure about yourself  0 - -  Trouble concentrating 0 - -  Moving slowly or fidgety/restless 0 - -  Suicidal thoughts 0 - -  PHQ-9 Score 2 - -  Difficult doing work/chores Somewhat difficult - -  Some recent data might be hidden

## 2019-11-16 NOTE — Progress Notes (Signed)
I reviewed CMA's note, was available for consultation, and agree with documentation and plan.

## 2019-11-16 NOTE — Progress Notes (Signed)
Patient presents in clinic today for Prolia injection. SQ injection was given in the Right arm. Patient tolerated the injection well. No signs or symptoms of a reaction were noted prior to patient leaving the nurse visit.

## 2019-11-21 NOTE — Progress Notes (Signed)
TELEPHONE ENCOUNTER   Patient verbally agreed to telephone visit and is aware that copayment and coinsurance may apply. Patient was treated using telemedicine according to accepted telemedicine protocols.  Location of the patient: patient's home      Location of provider: provider's home Names of all persons participating in the telemedicine service and role in the encounter: Arnette Norris, MD  Rozanna Box  Subjective:   Chief Complaint  Patient presents with  . left arm pain     HPI  Patient agrees to virtual visit.  She is C/O LUE pain.  Patient Active Problem List   Diagnosis Date Noted  . Left arm pain 11/22/2019  . 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 History of TIA (transient ischemic attack) 08/01/2019  . Anticoagulated 08/01/2019  . Hyperlipidemia   . Situational anxiety 05/23/2019  . IBS (irritable bowel syndrome) 04/11/2013  . PAF (paroxysmal atrial fibrillation) ([place]) 01/27/2013  . Cerebrovascular disease 01/27/2013  . Insomnia 11/02/2012  . GERD 03/12/2010  . Hypothyroidism 04/14/2007  . Depression 04/14/2007  . Essential hypertension 04/14/2007  . Osteoporosis 04/14/2007   Social History   Tobacco Use  . Smoking status: Never Smoker  . Smokeless tobacco: Never Used  Substance Use Topics  . Alcohol use: Yes    Comment: Rare    Current Outpatient Medications:  .  Alpha-D-Galactosidase (BEANO PO), Take by mouth 2 (two) times daily., Disp: , Rfl:  .  atorvastatin (LIPITOR) 20 MG tablet, Take 1 tablet (20 mg total) by mouth daily., Disp: 90 tablet, Rfl: 1 .  Bepotastine Besilate (BEPREVE) 1.5 % SOLN, Use one drop in each eye twice a day, Disp: , Rfl:  .  bumetanide (BUMEX) 1 MG tablet, Take 1 tablet (1 mg total) by mouth 2 (two) times daily., Disp: 180 tablet, Rfl: 1 .  buPROPion (WELLBUTRIN XL) 150 MG 24 hr tablet, TAKE 1 TABLET BY MOUTH DAILY., Disp: 90 tablet, Rfl: 1 .  Calcium Carbonate-Vitamin D (CALCIUM 600/VITAMIN D) 600-400  MG-UNIT per tablet, Take 1 tablet by mouth 2 (two) times daily.  , Disp: , Rfl:  .  Cholecalciferol (VITAMIN D-3) 5000 UNITS TABS, Take one tablet by mouth three times a week, Disp: , Rfl:  .  clonazePAM (KLONOPIN) 0.5 MG tablet, TAKE 1 TABLET BY MOUTH AT BEDTIME AS NEEDED FOR ANXIETY, Disp: 30 tablet, Rfl: 0 .  denosumab (PROLIA) 60 MG/ML SOLN injection, Inject 60 mg into the skin once. Administer in upper arm, thigh, or abdomen, Disp: 180 mL, Rfl: 0 .  diclofenac sodium (VOLTAREN) 1 % GEL, Apply 2g to affected area qid prn, Disp: 100 g, Rfl: 3 .  famotidine (PEPCID AC) 10 MG chewable tablet, Chew 1 tablet (10 mg total) by mouth daily., Disp: 90 tablet, Rfl: prm .  Fluticasone-Salmeterol (ADVAIR DISKUS) 100-50 MCG/DOSE AEPB, Inhale 1 puff into the lungs 2 (two) times daily., Disp: 180 each, Rfl: 1 .  levocetirizine (XYZAL) 5 MG tablet, Take 5 mg by mouth daily., Disp: , Rfl: 1 .  levothyroxine (SYNTHROID) 50 MCG tablet, Take 1 tablet (50 mcg total) by mouth daily., Disp: 90 tablet, Rfl: 1 .  metoprolol succinate (TOPROL-XL) 25 MG 24 hr tablet, Take 3 tablets (75 mg total) by mouth daily., Disp: 270 tablet, Rfl: 1 .  NEXIUM 40 MG capsule, Take 1 capsule (40 mg total) by mouth daily., Disp: 90 capsule, Rfl: 1 .  nystatin (MYCOSTATIN) 100000 UNIT/ML suspension, SWISH BY MOUTH 10 ML UP TO 4 TIMES A DAY,  Disp: 240 mL, Rfl: 1 .  potassium chloride (K-DUR) 10 MEQ tablet, TAKE 1 TABLET BY MOUTH 2 TIMES DAILY., Disp: 180 tablet, Rfl: 1 .  XARELTO 20 MG TABS tablet, TAKE 1 TABLET BY MOUTH DAILY., Disp: 90 tablet, Rfl: 0 No Known Allergies  Assessment & Plan:   1. Left arm pain     Orders Placed This Encounter  Procedures  . DG Shoulder Left   No orders of the defined types were placed in this encounter.   Arnette Norris, MD 11/22/2019  Time spent with the patient: 22 minutes, spent in obtaining information about her symptoms, reviewing her previous labs, evaluations, and treatments, counseling her  about her condition (please see the discussed topics above), and developing a plan to further investigate it; she had a number of questions which I addressed.   [id number] physician/qualified health professional telephone evaluation 5 to 10 minutes [id number] physician/qualified help functional [place] evaluation for 11 to 20 minutes [id number] physician/qualify he will professional telephone evaluation for 21 to 30 minutes\  Lab Results  Component Value Date   WBC 9.8 07/06/2019   HGB 13.4 07/06/2019   HCT 39.6 07/06/2019   PLT 335.0 07/06/2019   GLUCOSE 70 09/09/2019   CHOL 161 07/06/2019   TRIG 157.0 (H) 07/06/2019   HDL 38.10 (L) 07/06/2019   LDLDIRECT 129.2 05/28/2007   LDLCALC 92 07/06/2019   ALT 13 07/06/2019   AST 13 07/06/2019   NA 141 09/09/2019   K 4.1 09/09/2019   CL 100 09/09/2019   CREATININE 1.10 (H) 09/09/2019   BUN 19 09/09/2019   CO2 24 09/09/2019   TSH 1.57 05/23/2019   HGBA1C 5.7 09/07/2017    Lab Results  Component Value Date   TSH 1.57 05/23/2019   Lab Results  Component Value Date   WBC 9.8 07/06/2019   HGB 13.4 07/06/2019   HCT 39.6 07/06/2019   MCV 87.7 07/06/2019   PLT 335.0 07/06/2019   Lab Results  Component Value Date   NA 141 09/09/2019   K 4.1 09/09/2019   CO2 24 09/09/2019   GLUCOSE 70 09/09/2019   BUN 19 09/09/2019   CREATININE 1.10 (H) 09/09/2019   BILITOT 0.5 07/06/2019   ALKPHOS 85 07/06/2019   AST 13 07/06/2019   ALT 13 07/06/2019   PROT 7.2 07/06/2019   ALBUMIN 4.1 07/06/2019   CALCIUM 9.8 09/09/2019   GFR 56.13 (L) 07/06/2019   Lab Results  Component Value Date   CHOL 161 07/06/2019   Lab Results  Component Value Date   HDL 38.10 (L) 07/06/2019   Lab Results  Component Value Date   LDLCALC 92 07/06/2019   Lab Results  Component Value Date   TRIG 157.0 (H) 07/06/2019   Lab Results  Component Value Date   CHOLHDL 4 07/06/2019   Lab Results  Component Value Date   HGBA1C 5.7 09/07/2017       Assessment & Plan:    Problem List Items Addressed This Visit      Active Problems   Left arm pain    Deteriorated- started a couple months ago. She remembers laying in a position that is normally comfortable and that it was painful.  She had a EKG and there was no evidence of heart issue.  Pain is from her shoulder to elbow.  Pain isn't worsening with upper arm motion- neg arch sign.  No decreased grip strength.  Nothing has really made it better.  Comes and goes. ? 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  Shoulder xray ordered- scheduled for 1 pm today.      Relevant Orders   DG Shoulder Left      I am having Janalyn Shy. Pedraza "Jan" maintain her Calcium Carbonate-Vitamin D, Vitamin D-3, Bepotastine Besilate, Alpha-D-Galactosidase (BEANO PO), denosumab, levocetirizine, nystatin, famotidine, diclofenac sodium, potassium chloride, buPROPion, atorvastatin, bumetanide, Fluticasone-Salmeterol, levothyroxine, metoprolol succinate, clonazePAM, Xarelto, and NexIUM.  No orders of the defined types were placed in this encounter.    Arnette Norris, MD

## 2019-11-22 ENCOUNTER — Telehealth (INDEPENDENT_AMBULATORY_CARE_PROVIDER_SITE_OTHER): Payer: Medicare PPO | Admitting: Family Medicine

## 2019-11-22 ENCOUNTER — Other Ambulatory Visit: Payer: Self-pay | Admitting: Cardiology

## 2019-11-22 ENCOUNTER — Other Ambulatory Visit: Payer: Self-pay | Admitting: Family Medicine

## 2019-11-22 ENCOUNTER — Other Ambulatory Visit: Payer: Self-pay

## 2019-11-22 ENCOUNTER — Ambulatory Visit (INDEPENDENT_AMBULATORY_CARE_PROVIDER_SITE_OTHER): Payer: Medicare PPO

## 2019-11-22 ENCOUNTER — Other Ambulatory Visit: Payer: Medicare PPO

## 2019-11-22 DIAGNOSIS — M79602 Pain in left arm: Secondary | ICD-10-CM | POA: Diagnosis not present

## 2019-11-22 DIAGNOSIS — M19012 Primary osteoarthritis, left shoulder: Secondary | ICD-10-CM | POA: Diagnosis not present

## 2019-11-22 NOTE — Telephone Encounter (Signed)
12f 64.4kg Scr 1.1 09/09/19 ccr 45.6 Lovw/berry 09/14/19 Pt requesting 20mg  xarelto but only qualifies for the 15mg  will need a pharmd review

## 2019-11-22 NOTE — Assessment & Plan Note (Addendum)
Deteriorated- started a couple months ago. She remembers laying in a position that is normally comfortable and that it was painful.  She had a EKG and there was no evidence of heart issue.  Pain is from her shoulder to elbow.  Pain isn't worsening with upper arm motion- neg arch sign.  No decreased grip strength.  Nothing has really made it better.  Comes and goes. ? impugnment coming from [facility] joint arthritidis.  Shoulder xray ordered- scheduled for 1 pm today.

## 2019-11-22 NOTE — Addendum Note (Signed)
Addended by: Lynnea Ferrier on: 11/22/2019 12:21 PM   Modules accepted: Orders

## 2019-11-22 NOTE — Telephone Encounter (Signed)
Per Dr. Deborra Medina.  11/15/2019  Persistent issue for her.  Was last seen for this on 07/06/19, note reviewed- increased Belsomra to 15 mg nightly.

## 2019-11-23 ENCOUNTER — Ambulatory Visit: Payer: Medicare PPO | Attending: Internal Medicine

## 2019-11-23 ENCOUNTER — Other Ambulatory Visit: Payer: Self-pay | Admitting: Family Medicine

## 2019-11-23 ENCOUNTER — Encounter: Payer: Self-pay | Admitting: Family Medicine

## 2019-11-23 DIAGNOSIS — Z23 Encounter for immunization: Secondary | ICD-10-CM | POA: Insufficient documentation

## 2019-11-23 NOTE — Telephone Encounter (Signed)
Patient is calling and responding to the mychart message and stated the she would be more than happy to whoever Dr. Deborra Medina refer her to see. CB is [phone]

## 2019-11-23 NOTE — Telephone Encounter (Signed)
Need to switch to 15 mg dose. LMOM for patient to return call for explaination

## 2019-11-23 NOTE — Progress Notes (Signed)
   Covid-19 Vaccination Clinic  Name:  Patricia Peck    MRN: [id number] DOB: 1946/06/23  11/23/2019  [name] was observed post Covid-19 immunization for 15 minutes without incidence. She was provided with Vaccine Information Sheet and instruction to access the V-Safe system.   [name] was instructed to call 911 with any severe reactions post vaccine: Marland Kitchen Difficulty breathing  . Swelling of your face and throat  . A fast heartbeat  . A bad rash all over your body  . Dizziness and weakness    Immunizations Administered    Name Date Dose VIS Date Route   Pfizer COVID-19 Vaccine 11/23/2019  5:12 PM 0.3 mL 10/14/2019 Intramuscular   Manufacturer: [place]   Lot: [id number]   [place]: [id number]

## 2019-11-23 NOTE — Telephone Encounter (Signed)
TA-I spoke to pharmacist/they had been dispensing the Belsomra 10mg  & 15mg Deborha Payment are asking if you want them to do away with both of them and just prescribe the Belsomra 20mg  1qhs instead? Plz advise/thx dmf

## 2019-11-24 ENCOUNTER — Telehealth: Payer: Self-pay

## 2019-11-24 ENCOUNTER — Other Ambulatory Visit: Payer: Self-pay | Admitting: Cardiology

## 2019-11-24 MED ORDER — BELSOMRA 20 MG PO TABS: 1.0000 | ORAL_TABLET | Freq: Every day | ORAL | 5 refills | Status: DC

## 2019-11-24 MED ORDER — RIVAROXABAN 15 MG PO TABS: 15.0000 mg | ORAL_TABLET | Freq: Every day | ORAL | 1 refills | Status: DC

## 2019-11-24 NOTE — Addendum Note (Signed)
Addended by: Marrion Coy on: 11/24/2019 10:29 AM   Modules accepted: Orders

## 2019-11-24 NOTE — Telephone Encounter (Signed)
TA-I have changed it and pended for your approval/the pharmacy does need this new refill/thx dmf

## 2019-11-24 NOTE — Telephone Encounter (Signed)
Rozanna Box KeyMarjory Sneddon - PA Case [id number] Need help? Call us at [phone] Outcome Approvedon January 19 Your PA request has been approved. Additional information will be provided in the approval communication. (Message 1145) Drug NEXIUM 40 MG dr capsules Form FEP Electronic PA Form (NCPDP)

## 2019-11-24 NOTE — Telephone Encounter (Signed)
Yes we can try the Belsomra 20 mg daily. Do they need a new rx?

## 2019-11-29 ENCOUNTER — Encounter: Payer: Self-pay | Admitting: Pulmonary Disease

## 2019-11-29 ENCOUNTER — Institutional Professional Consult (permissible substitution): Payer: Medicare PPO | Admitting: Pulmonary Disease

## 2019-11-29 ENCOUNTER — Ambulatory Visit (INDEPENDENT_AMBULATORY_CARE_PROVIDER_SITE_OTHER): Payer: Medicare PPO | Admitting: Pulmonary Disease

## 2019-11-29 ENCOUNTER — Other Ambulatory Visit: Payer: Self-pay

## 2019-11-29 VITALS — BP 112/62 | HR 77 | Ht 64.0 in | Wt 137.8 lb

## 2019-11-29 DIAGNOSIS — F99 Mental disorder, not otherwise specified: Secondary | ICD-10-CM | POA: Diagnosis not present

## 2019-11-29 DIAGNOSIS — F329 Major depressive disorder, single episode, unspecified: Secondary | ICD-10-CM | POA: Diagnosis not present

## 2019-11-29 DIAGNOSIS — F32A Depression, unspecified: Secondary | ICD-10-CM

## 2019-11-29 DIAGNOSIS — F5105 Insomnia due to other mental disorder: Secondary | ICD-10-CM | POA: Diagnosis not present

## 2019-11-29 DIAGNOSIS — F419 Anxiety disorder, unspecified: Secondary | ICD-10-CM

## 2019-11-29 NOTE — Patient Instructions (Signed)
History of insomnia  Continue to try and get as many hours of sleep as possible  Let us give Belsomra some time to see whether that helps you significantly  Dose may be adjusted as needed depending on the effect of the medication  Try and compartmentalize as best as you can so that you are not worrying in the middle of night  Scheduled daytime nap as long as you wake up for me and feeling better may be helpful  Adequate medical management of depression and anxiety will help  I will see you in about 3 months just to see how things are going

## 2019-11-29 NOTE — Progress Notes (Signed)
Subjective:    Patient ID: Patricia Peck, female    DOB: 04-04-1946, 74 y.o.   MRN: [id number]  Patient being seen for insomnia  Has had insomnia chronically for about 10 years Multiple medication trials in the past  Usually goes to bed between 10 and 11, takes about an hour to fall asleep Wakes up at least twice during the night Final awakening time about 7 AM  She has very minimal snoring according to spouse, no witnessed apneas  Only time she feels sleepy during the day he is just past lunchtime Occasionally when she takes a nap, it does help  Past Medical History:  Diagnosis Date  . Allergy   . Arthritis    hands  . Asthma   . Atrial fibrillation ([place])   . Atrial flutter ([place])   . Depression   . Dysrhythmia    a-fib, on Xarelto  . GERD (gastroesophageal reflux disease)   . Hyperlipidemia   . Hypertension   . Hypothyroidism   . Osteoporosis   . TIA (transient ischemic attack)    Social History   Socioeconomic History  . Marital status: Married    Spouse name: Not on file  . Number of children: 2  . Years of education: Not on file  . Highest education level: Not on file  Occupational History    Employer: RETIRED  Tobacco Use  . Smoking status: Never Smoker  . Smokeless tobacco: Never Used  Substance and Sexual Activity  . Alcohol use: Yes    Comment: Rare  . Drug use: No  . Sexual activity: Yes  Other Topics Concern  . Not on file  Social History Narrative   Does have HPOA- sons Eddie Dibbles and Wille Glaser.   Does desire life support if not futile.   Not sure about feeding tubes.   Social Determinants of Health   Financial Resource Strain:   . Difficulty of Paying Living Expenses: Not on file  Food Insecurity:   . Worried About Charity fundraiser in the Last Year: Not on file  . Ran Out of Food in the Last Year: Not on file  Transportation Needs:   . Lack of Transportation (Medical): Not on file  . Lack of Transportation (Non-Medical): Not on file    Physical Activity:   . Days of Exercise per Week: Not on file  . Minutes of Exercise per Session: Not on file  Stress:   . Feeling of Stress : Not on file  Social Connections:   . Frequency of Communication with Friends and Family: Not on file  . Frequency of Social Gatherings with Friends and Family: Not on file  . Attends Religious Services: Not on file  . Active Member of Clubs or Organizations: Not on file  . Attends Archivist Meetings: Not on file  . Marital Status: Not on file  Intimate Partner Violence:   . Fear of Current or Ex-Partner: Not on file  . Emotionally Abused: Not on file  . Physically Abused: Not on file  . Sexually Abused: Not on file   Family History  Problem Relation Age of Onset  . Breast cancer Mother   . Drug abuse Son   . Depression Son   . Stroke Paternal Aunt   . Heart disease Paternal Grandfather   . Diabetes Maternal Grandfather       Review of Systems  Constitutional: Negative for fever and unexpected weight change.  HENT: Negative for congestion, dental problem, ear  pain, nosebleeds, postnasal drip, rhinorrhea, sinus pressure, sneezing, sore throat and trouble swallowing.   Eyes: Negative for redness and itching.  Respiratory: Negative for cough, chest tightness, shortness of breath and wheezing.   Cardiovascular: Negative for palpitations and leg swelling.  Gastrointestinal: Negative for nausea and vomiting.  Genitourinary: Negative for dysuria.  Musculoskeletal: Negative for joint swelling.  Skin: Negative for rash.  Allergic/Immunologic: Negative.  Negative for environmental allergies, food allergies and immunocompromised state.  Neurological: Negative for headaches.  Hematological: Bruises/bleeds easily.  Psychiatric/Behavioral: Negative for dysphoric mood. The patient is nervous/anxious.       Objective:   Physical Exam Vitals:   11/29/19 1355  BP: 112/62  Pulse: 77  SpO2: 95%   Results of the Epworth flowsheet  11/29/2019  Sitting and reading 0  Watching TV 2  Sitting, inactive in a public place (e.g. a theatre or a meeting) 0  As a passenger in a car for an hour without a break 2  Lying down to rest in the afternoon when circumstances permit 2  Sitting and talking to someone 0  Sitting quietly after a lunch without alcohol 0  In a car, while stopped for a few minutes in traffic 0  Total score 6       Assessment & Plan:  .  Sleep onset and sleep maintenance insomnia -Recently prescribed Belsomra  .  History of depression/anxiety -She feels symptoms are well controlled at present  [place] -Encouraged to try and compartmentalize so that she is not waking up in the middle of the night thinking about many problems actively -Daytime naps may not be bad as long as she wakes up feeling better rested and able to function during the day -We will not initiate multiple medications at the present time  -Encouraged about adequate control of anxiety and depression-this will have a significant bearing on her ability to fall asleep and maintain sleep as well  I will see her in about 3 months

## 2019-12-01 ENCOUNTER — Telehealth: Payer: Self-pay | Admitting: Family Medicine

## 2019-12-01 NOTE — Telephone Encounter (Signed)
Patient is calling and wanted to speak to someone regarding medication. CB is [phone]

## 2019-12-05 NOTE — Telephone Encounter (Signed)
PA for Belsomra attempted and denied by secondary insurance/Faxed to pharmacy/thx dmf

## 2019-12-05 NOTE — Progress Notes (Deleted)
Virtual Visit via Video   Due to the COVID-19 pandemic, this visit was completed with telemedicine (audio/video) technology to reduce patient and provider exposure as well as to preserve personal protective equipment.   I connected with Patricia Peck by a video enabled telemedicine application and verified that I am speaking with the correct person using two identifiers. Location patient: Home Location provider: [facility], Office Persons participating in the virtual visit: SANDRIKA HEMEON, Patricia Norris, MD   I discussed the limitations of evaluation and management by telemedicine and the availability of in person appointments. The patient expressed understanding and agreed to proceed.  Care Team   Patient Care Team: Lucille Passy, MD as PCP - General Stanford Breed Denice Bors, MD as PCP - Cardiology (Cardiology) Stanford Breed Denice Bors, MD as Consulting Physician (Cardiology) Leta Baptist, MD as Consulting Physician (Otolaryngology) Mosetta Anis, MD as Referring Physician (Allergy)  Subjective:   HPI: Patient is connecting today to discuss a specific medication. She received several letters stating that they won't cover her medications.   Review of Systems  Constitutional: Negative for fever and malaise/fatigue.  HENT: Negative for congestion and hearing loss.   Eyes: Negative for blurred vision, discharge and redness.  Respiratory: Negative for cough and shortness of breath.   Cardiovascular: Negative for chest pain, palpitations and leg swelling.  Gastrointestinal: Negative for abdominal pain and heartburn.  Genitourinary: Negative for dysuria.  Musculoskeletal: Negative for falls.  Skin: Negative for rash.  Neurological: Negative for loss of consciousness and headaches.  Endo/Heme/Allergies: Does not bruise/bleed easily.  Psychiatric/Behavioral: Negative for depression and memory loss.     Patient Active Problem List   Diagnosis Date Noted  . Left arm pain 11/22/2019  . 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 History of TIA (transient ischemic attack) 08/01/2019  . Anticoagulated 08/01/2019  . Hyperlipidemia   . Situational anxiety 05/23/2019  . IBS (irritable bowel syndrome) 04/11/2013  . PAF (paroxysmal atrial fibrillation) ([place]) 01/27/2013  . Cerebrovascular disease 01/27/2013  . Insomnia 11/02/2012  . GERD 03/12/2010  . Hypothyroidism 04/14/2007  . Depression 04/14/2007  . Essential hypertension 04/14/2007  . Osteoporosis 04/14/2007    Social History   Tobacco Use  . Smoking status: Never Smoker  . Smokeless tobacco: Never Used  Substance Use Topics  . Alcohol use: Yes    Comment: Rare    Current Outpatient Medications:  .  Alpha-D-Galactosidase (BEANO PO), Take by mouth 2 (two) times daily., Disp: , Rfl:  .  atorvastatin (LIPITOR) 20 MG tablet, TAKE 1 TABLET BY MOUTH DAILY., Disp: 90 tablet, Rfl: 1 .  Bepotastine Besilate (BEPREVE) 1.5 % SOLN, Use one drop in each eye twice a day, Disp: , Rfl:  .  bumetanide (BUMEX) 1 MG tablet, Take 1 tablet (1 mg total) by mouth 2 (two) times daily., Disp: 180 tablet, Rfl: 1 .  buPROPion (WELLBUTRIN XL) 150 MG 24 hr tablet, TAKE 1 TABLET BY MOUTH DAILY., Disp: 90 tablet, Rfl: 1 .  Calcium Carbonate-Vitamin D (CALCIUM 600/VITAMIN D) 600-400 MG-UNIT per tablet, Take 1 tablet by mouth 2 (two) times daily.  , Disp: , Rfl:  .  Cholecalciferol (VITAMIN D-3) 5000 UNITS TABS, Take one tablet by mouth three times a week, Disp: , Rfl:  .  clonazePAM (KLONOPIN) 0.5 MG tablet, TAKE 1 TABLET BY MOUTH AT BEDTIME AS NEEDED FOR ANXIETY (Patient not taking: Reported on 11/29/2019), Disp: 30 tablet, Rfl: 0 .  denosumab (PROLIA) 60 MG/ML SOLN injection, Inject  60 mg into the skin once. Administer in upper arm, thigh, or abdomen, Disp: 180 mL, Rfl: 0 .  diclofenac sodium (VOLTAREN) 1 % GEL, Apply 2g to affected area qid prn, Disp: 100 g, Rfl: 3 .  famotidine (PEPCID AC) 10 MG chewable tablet, Chew 1 tablet (10 mg total)  by mouth daily., Disp: 90 tablet, Rfl: prm .  Fluticasone-Salmeterol (ADVAIR DISKUS) 100-50 MCG/DOSE AEPB, Inhale 1 puff into the lungs 2 (two) times daily., Disp: 180 each, Rfl: 1 .  levocetirizine (XYZAL) 5 MG tablet, Take 5 mg by mouth daily., Disp: , Rfl: 1 .  levothyroxine (SYNTHROID) 50 MCG tablet, Take 1 tablet (50 mcg total) by mouth daily., Disp: 90 tablet, Rfl: 1 .  metoprolol succinate (TOPROL-XL) 25 MG 24 hr tablet, Take 3 tablets (75 mg total) by mouth daily., Disp: 270 tablet, Rfl: 1 .  NEXIUM 40 MG capsule, Take 1 capsule (40 mg total) by mouth daily., Disp: 90 capsule, Rfl: 1 .  nystatin (MYCOSTATIN) 100000 UNIT/ML suspension, SWISH BY MOUTH 10 ML UP TO 4 TIMES A DAY, Disp: 240 mL, Rfl: 1 .  potassium chloride (KLOR-CON) 10 MEQ tablet, TAKE 1 TABLET BY MOUTH 2 TIMES DAILY., Disp: 180 tablet, Rfl: 1 .  Rivaroxaban (XARELTO) 15 MG TABS tablet, Take 1 tablet (15 mg total) by mouth daily with supper., Disp: 90 tablet, Rfl: 1 .  Suvorexant (BELSOMRA) 20 MG TABS, Take 1 tablet by mouth at bedtime., Disp: 30 tablet, Rfl: 5 .  XARELTO 20 MG TABS tablet, TAKE 1 TABLET BY MOUTH DAILY. (Patient not taking: Reported on 11/29/2019), Disp: 90 tablet, Rfl: 0  No Known Allergies  Objective:   VITALS: Per patient if applicable, see vitals. GENERAL: Alert, appears well and in no acute distress. HEENT: Atraumatic, conjunctiva clear, no obvious abnormalities on inspection of external nose and ears. NECK: Normal movements of the head and neck. CARDIOPULMONARY: No increased WOB. Speaking in clear sentences. I:E ratio WNL.  MS: Moves all visible extremities without noticeable abnormality. PSYCH: Pleasant and cooperative, well-groomed. Speech normal rate and rhythm. Affect is appropriate. Insight and judgement are appropriate. Attention is focused, linear, and appropriate.  NEURO: CN grossly intact. Oriented as arrived to appointment on time with no prompting. Moves both UE equally.  SKIN: No obvious  lesions, wounds, erythema, or cyanosis noted on face or hands.  Depression screen [facility] 2/9 07/06/2019 05/23/2019 05/18/2019  Decreased Interest 0 0 0  Down, Depressed, Hopeless 0 0 0  PHQ - 2 Score 0 0 0  Altered sleeping 1 - -  Tired, decreased energy 1 - -  Change in appetite 0 - -  Feeling bad or failure about yourself  0 - -  Trouble concentrating 0 - -  Moving slowly or fidgety/restless 0 - -  Suicidal thoughts 0 - -  PHQ-9 Score 2 - -  Difficult doing work/chores Somewhat difficult - -  Some recent data might be hidden     . COVID-19 Education: The signs and symptoms of COVID-19 were discussed with the patient and how to seek care for testing if needed. The importance of social distancing was discussed today. . Reviewed expectations re: course of current medical issues. . Discussed self-management of symptoms. . Outlined signs and symptoms indicating need for more acute intervention. . Patient verbalized understanding and all questions were answered. Marland Kitchen Health Maintenance issues including appropriate healthy diet, exercise, and smoking avoidance were discussed with patient. . See orders for this visit as documented in the electronic medical  record.  Patricia Norris, MD  Records requested if needed. Time spent: *** minutes, of which >50% was spent in obtaining information about her symptoms, reviewing her previous labs, evaluations, and treatments, counseling her about her condition (please see the discussed topics above), and developing a plan to further investigate it; she had a number of questions which I addressed.   Lab Results  Component Value Date   WBC 9.8 07/06/2019   HGB 13.4 07/06/2019   HCT 39.6 07/06/2019   PLT 335.0 07/06/2019   GLUCOSE 70 09/09/2019   CHOL 161 07/06/2019   TRIG 157.0 (H) 07/06/2019   HDL 38.10 (L) 07/06/2019   LDLDIRECT 129.2 05/28/2007   LDLCALC 92 07/06/2019   ALT 13 07/06/2019   AST 13 07/06/2019   NA 141 09/09/2019   K 4.1 09/09/2019   CL  100 09/09/2019   CREATININE 1.10 (H) 09/09/2019   BUN 19 09/09/2019   CO2 24 09/09/2019   TSH 1.57 05/23/2019   HGBA1C 5.7 09/07/2017    Lab Results  Component Value Date   TSH 1.57 05/23/2019   Lab Results  Component Value Date   WBC 9.8 07/06/2019   HGB 13.4 07/06/2019   HCT 39.6 07/06/2019   MCV 87.7 07/06/2019   PLT 335.0 07/06/2019   Lab Results  Component Value Date   NA 141 09/09/2019   K 4.1 09/09/2019   CO2 24 09/09/2019   GLUCOSE 70 09/09/2019   BUN 19 09/09/2019   CREATININE 1.10 (H) 09/09/2019   BILITOT 0.5 07/06/2019   ALKPHOS 85 07/06/2019   AST 13 07/06/2019   ALT 13 07/06/2019   PROT 7.2 07/06/2019   ALBUMIN 4.1 07/06/2019   CALCIUM 9.8 09/09/2019   GFR 56.13 (L) 07/06/2019   Lab Results  Component Value Date   CHOL 161 07/06/2019   Lab Results  Component Value Date   HDL 38.10 (L) 07/06/2019   Lab Results  Component Value Date   LDLCALC 92 07/06/2019   Lab Results  Component Value Date   TRIG 157.0 (H) 07/06/2019   Lab Results  Component Value Date   CHOLHDL 4 07/06/2019   Lab Results  Component Value Date   HGBA1C 5.7 09/07/2017       Assessment & Plan:   Problem List Items Addressed This Visit    None      I am having Janalyn Shy. Krebsbach "Jan" maintain her Calcium Carbonate-Vitamin D, Vitamin D-3, Bepotastine Besilate, Alpha-D-Galactosidase (BEANO PO), denosumab, levocetirizine, nystatin, famotidine, diclofenac sodium, bumetanide, Fluticasone-Salmeterol, levothyroxine, metoprolol succinate, clonazePAM, Xarelto, NexIUM, buPROPion, potassium chloride, atorvastatin, Belsomra, and Rivaroxaban.  No orders of the defined types were placed in this encounter.    Patricia Norris, MD

## 2019-12-06 ENCOUNTER — Telehealth: Payer: Self-pay

## 2019-12-06 ENCOUNTER — Telehealth: Payer: Medicare PPO | Admitting: Family Medicine

## 2019-12-06 NOTE — Telephone Encounter (Signed)
PA initiated for Belsomra 20mg  #90 per 90d and Metoprolol ER 25mg  #270 per 90d and sent to secondary insurance plan via CMM/thx dmf

## 2019-12-07 ENCOUNTER — Other Ambulatory Visit: Payer: Self-pay

## 2019-12-07 ENCOUNTER — Ambulatory Visit: Payer: Self-pay

## 2019-12-07 ENCOUNTER — Ambulatory Visit (INDEPENDENT_AMBULATORY_CARE_PROVIDER_SITE_OTHER): Payer: Medicare PPO | Admitting: Family Medicine

## 2019-12-07 ENCOUNTER — Encounter: Payer: Self-pay | Admitting: Family Medicine

## 2019-12-07 VITALS — BP 120/78 | HR 40 | Ht 64.0 in | Wt 135.6 lb

## 2019-12-07 DIAGNOSIS — M7552 Bursitis of left shoulder: Secondary | ICD-10-CM | POA: Diagnosis not present

## 2019-12-07 DIAGNOSIS — M79622 Pain in left upper arm: Secondary | ICD-10-CM

## 2019-12-07 NOTE — Progress Notes (Signed)
Subjective:    CC: L upper arm pain  I, Patricia Peck, LAT, ATC, am serving as scribe for Dr. [name].  HPI: Pt is a 74 y/o female presenting w/ c/o L upper arm pain x 4 months w/ no known MOI.  She rates her pain at a 5/10 at it's worst and describes her pain as dull and aching but can be sharp.  Radiating pain: Yes from L upper arm to L elbow Mechanical L shoulder symptoms: No Numbness/tingling: No Weakness: No Neck pain: No Aggravating factors: Overhead ROM w/ ADLs/IADLs such as fixing hair Treatments tried: Voltaren gel and horse linament  L shoulder X-ray: 11/22/19  Pertinent review of Systems: No fevers or chills  Relevant historical information: Heart disease, hypothyroidism   Objective:    Vitals:   12/07/19 1403  BP: 120/78  Pulse: (!) 40  SpO2: 96%   General: Well Developed, well nourished, and in no acute distress.   MSK:  C-spine: Normal-appearing nontender normal cervical motion. Upper extremity strength intact.  Left shoulder: Normal-appearing nontender normal motion. Strength intact abduction external and internal rotation.  Some pain with resisted abduction. Negative Hawkins and Neer's test. Positive empty can test. Negative Yergason's and speeds test.  Of note: When ATC was showing patient home exercise program she had more pain with external rotation along with some weakness.  This was not readily apparent during my physical exam component.  Right shoulder normal-appearing nontender normal motion.  Normal strength.  Negative impingement testing.  Lab and Radiology Results  DG Shoulder Left  Result Date: 11/22/2019 CLINICAL DATA:  Left shoulder pain. EXAM: LEFT SHOULDER - 2+ VIEW COMPARISON:  None. FINDINGS: Normal glenohumeral articulation. Mild degenerative changes seen involving the left acromioclavicular joint and left acromion. Normal left humeral head and visualized proximal humerus. Normal visualized left scapula. There is no  demonstrated soft tissue abnormality. Normal visualized pulmonary apex. IMPRESSION: Mild degenerative changes without evidence of acute osseous abnormality. Electronically Signed   By: Virgina Norfolk M.D.   On: 11/22/2019 16:15   I, [name], personally (independently) visualized and performed the interpretation of the images attached in this note.  Limited musculoskeletal ultrasound left shoulder. Normal-appearing biceps tendon intact in bicipital groove. Subscapularis tendon normal-appearing. Supraspinatus tendon normal however slight nodularity of insertion site on humerus present. Mild subacromial bursitis present. Infraspinatus tendon normal-appearing again small hyperechoic nodule at tendon insertion site present. AC joint narrowed with effusion. Impression: Rotator cuff tendinopathy.    Impression and Recommendations:    Assessment and Plan: 74 y.o. female with left upper arm pain.  Likely coming from rotator cuff tendinopathy. After discussion plan for trial of home exercise program as taught by ATC in clinic today.  If not improving patient will let me know and we will proceed with referral to physical therapy.  Likely will pick [place] location.  Recheck back in about a month or 6 weeks. If not improving would proceed with either injection or MRI.  [id number]; 15 additional minutes spent for Therapeutic exercises as stated in above notes.  This included exercises focusing on stretching, strengthening, with significant focus on eccentric aspects.   Long term goals include an improvement in range of motion, strength, endurance as well as avoiding reinjury. Patient's frequency would include in 1-2 times a day, 3-5 times a week for a duration of 6-12 weeks.  Proper technique shown and discussed handout in great detail with ATC.  All questions were discussed and answered.  Orders Placed This Encounter  Procedures  . [place] LIMITED JOINT SPACE STRUCTURES UP LEFT(NO LINKED  CHARGES)    Order Specific Question:   Reason for Exam (SYMPTOM  OR DIAGNOSIS REQUIRED)    Answer:   L upper arm pain    Order Specific Question:   Preferred imaging location?    Answer:   [place]   No orders of the defined types were placed in this encounter.   Discussed warning signs or symptoms. Please see discharge instructions. Patient expresses understanding.   The above documentation has been reviewed and is accurate and complete [name]

## 2019-12-07 NOTE — Patient Instructions (Addendum)
Thank you for coming in today. Do the home exercises.  If not improving let me know in 1-2 weeks and I will plan for MRI.  Recheck in 4-6 weeks.  Return sooner if needed.  Also try using over the counter voltaren gel up to 4x daily for pain as needed.   Please perform the exercise program that we have prepared for you and gone over in detail on a daily basis.  In addition to the handout you were provided you can access your program through: [email or web address]   Your unique program code is:  [email or web address]

## 2019-12-08 NOTE — Telephone Encounter (Signed)
PA for Metoprolol was deemed as unnecessary per CMM/Belsomra was approved from 1.5.21-2.4.2022/sent MyChart message to patient/thx dmf

## 2019-12-14 ENCOUNTER — Ambulatory Visit: Payer: Medicare PPO | Attending: Internal Medicine

## 2019-12-14 NOTE — Progress Notes (Signed)
   Covid-19 Vaccination Clinic  Name:  Patricia Peck    MRN: [id number] DOB: 01-01-1946  12/14/2019  [name] was observed post Covid-19 immunization for 15 minutes without incidence. She was provided with Vaccine Information Sheet and instruction to access the V-Safe system.   [name] was instructed to call 911 with any severe reactions post vaccine: Marland Kitchen Difficulty breathing  . Swelling of your face and throat  . A fast heartbeat  . A bad rash all over your body  . Dizziness and weakness

## 2019-12-19 DIAGNOSIS — Z1283 Encounter for screening for malignant neoplasm of skin: Secondary | ICD-10-CM | POA: Diagnosis not present

## 2019-12-19 DIAGNOSIS — L821 Other seborrheic keratosis: Secondary | ICD-10-CM | POA: Diagnosis not present

## 2019-12-19 DIAGNOSIS — L820 Inflamed seborrheic keratosis: Secondary | ICD-10-CM | POA: Diagnosis not present

## 2019-12-20 ENCOUNTER — Ambulatory Visit: Payer: Medicare Other | Admitting: Cardiovascular Disease

## 2019-12-27 ENCOUNTER — Ambulatory Visit (INDEPENDENT_AMBULATORY_CARE_PROVIDER_SITE_OTHER): Payer: Medicare PPO | Admitting: Cardiovascular Disease

## 2019-12-27 ENCOUNTER — Other Ambulatory Visit: Payer: Self-pay

## 2019-12-27 ENCOUNTER — Encounter: Payer: Self-pay | Admitting: Cardiovascular Disease

## 2019-12-27 DIAGNOSIS — M79602 Pain in left arm: Secondary | ICD-10-CM

## 2019-12-27 NOTE — Patient Instructions (Signed)
Medication Instructions:  Your physician recommends that you continue on your current medications as directed. Please refer to the Current Medication list given to you today.  If you need a refill on your cardiac medications before your next appointment, please call your pharmacy.   Lab work: NONE  Testing/Procedures: NONE  Follow-Up: At [facility], you and your health needs are our priority.  As part of our continuing mission to provide you with exceptional heart care, we have created designated Provider Care Teams.  These Care Teams include your primary Cardiologist (physician) and Advanced Practice Providers (APPs -  Physician Assistants and Nurse Practitioners) who all work together to provide you with the care you need, when you need it. You may see Dr. Gwenlyn Found or one of the following Advanced Practice Providers on your designated Care Team:    Kerin Ransom, PA-C  [place], [place]  Coletta Memos, [place]  Your physician wants you to follow-up in: 3 months with Dr. Stanford Breed   Your physician wants you to follow-up with Dr. Gwenlyn Found as needed.

## 2019-12-27 NOTE — Progress Notes (Signed)
12/27/2019 [name]   1946/09/15  [id number]  Primary Physician Lucille Passy, MD Primary Cardiologist: Lorretta Harp MD Lupe Carney, [place]  HPI:  Patricia Peck is a 74 y.o.   thin appearing married Caucasian female mother of 2 children who is retired from working in the [employer] school system.  She is referred by Dr. 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  She is a cardiology patient of Dr. Jacalyn Lefevre.    I last saw her in the office 09/14/2019.  She does have a history of treated hypertension and hyperlipidemia as well as PAF on oral anticoagulation.  She is never had a heart attack but has had TIAs in the past.  She denies chest pain or shortness of breath.  She has had shoulder pain on the left side for the last several weeks and saw Dr. Martinique as an add on 08/17/2019 for this.  He felt the sound more musculoskeletal but did order a coronary CTA which is yet to be performed to rule out an atypical anginal presentation.  She says her arm hurts her when she lifts it above midline especially in the deltoid and bicep area.  She does have a left subclavian bruit on exam and a 25 mm blood pressure differential across her upper extremities.  Her current carotid Doppler study performed 08/17/2019 only showed mild increase in velocities in the left subclavian artery with antegrade vertebral blood flow.  Since I saw her 3 months ago she did have a left shoulder x-ray that showed some mild osseous changes.  She has not had any further significant symptoms in her left upper extremity except for some occasional cramping.    Current Meds  Medication Sig  . Alpha-D-Galactosidase (BEANO PO) Take by mouth 2 (two) times daily.  Marland Kitchen atorvastatin (LIPITOR) 20 MG tablet TAKE 1 TABLET BY MOUTH DAILY.  Marland Kitchen Bepotastine Besilate (BEPREVE) 1.5 % SOLN Use one drop in each eye twice a day  . bumetanide (BUMEX) 1 MG tablet Take 1 tablet (1 mg  total) by mouth 2 (two) times daily.  Marland Kitchen buPROPion (WELLBUTRIN XL) 150 MG 24 hr tablet TAKE 1 TABLET BY MOUTH DAILY.  . Calcium Carbonate-Vitamin D (CALCIUM 600/VITAMIN D) 600-400 MG-UNIT per tablet Take 1 tablet by mouth 2 (two) times daily.    . Cholecalciferol (VITAMIN D-3) 5000 UNITS TABS Take one tablet by mouth three times a week  . clonazePAM (KLONOPIN) 0.5 MG tablet TAKE 1 TABLET BY MOUTH AT BEDTIME AS NEEDED FOR ANXIETY  . denosumab (PROLIA) 60 MG/ML SOLN injection Inject 60 mg into the skin once. Administer in upper arm, thigh, or abdomen  . diclofenac sodium (VOLTAREN) 1 % GEL Apply 2g to affected area qid prn  . famotidine (PEPCID AC) 10 MG chewable tablet Chew 1 tablet (10 mg total) by mouth daily.  . Fluticasone-Salmeterol (ADVAIR DISKUS) 100-50 MCG/DOSE AEPB Inhale 1 puff into the lungs 2 (two) times daily.  Marland Kitchen levocetirizine (XYZAL) 5 MG tablet Take 5 mg by mouth daily.  Marland Kitchen levothyroxine (SYNTHROID) 50 MCG tablet Take 1 tablet (50 mcg total) by mouth daily.  . metoprolol succinate (TOPROL-XL) 25 MG 24 hr tablet Take 3 tablets (75 mg total) by mouth daily.  Marland Kitchen NEXIUM 40 MG capsule Take 1 capsule (40 mg total) by mouth daily.  Marland Kitchen nystatin (MYCOSTATIN) 100000 UNIT/ML suspension SWISH BY MOUTH 10 ML UP TO 4 TIMES A DAY  . potassium chloride (KLOR-CON)  10 MEQ tablet TAKE 1 TABLET BY MOUTH 2 TIMES DAILY.  Marland Kitchen Rivaroxaban (XARELTO) 15 MG TABS tablet Take 1 tablet (15 mg total) by mouth daily with supper.  . Suvorexant (BELSOMRA) 20 MG TABS Take 1 tablet by mouth at bedtime.  . [DISCONTINUED] XARELTO 20 MG TABS tablet TAKE 1 TABLET BY MOUTH DAILY.     No Known Allergies  Social History   Socioeconomic History  . Marital status: Married    Spouse name: Not on file  . Number of children: 2  . Years of education: Not on file  . Highest education level: Not on file  Occupational History    Employer: RETIRED  Tobacco Use  . Smoking status: Never Smoker  . Smokeless tobacco: Never Used   Substance and Sexual Activity  . Alcohol use: Yes    Comment: Rare  . Drug use: No  . Sexual activity: Yes  Other Topics Concern  . Not on file  Social History Narrative   Does have HPOA- sons Eddie Dibbles and Wille Glaser.   Does desire life support if not futile.   Not sure about feeding tubes.   Social Determinants of Health   Financial Resource Strain:   . Difficulty of Paying Living Expenses: Not on file  Food Insecurity:   . Worried About Charity fundraiser in the Last Year: Not on file  . Ran Out of Food in the Last Year: Not on file  Transportation Needs:   . Lack of Transportation (Medical): Not on file  . Lack of Transportation (Non-Medical): Not on file  Physical Activity:   . Days of Exercise per Week: Not on file  . Minutes of Exercise per Session: Not on file  Stress:   . Feeling of Stress : Not on file  Social Connections:   . Frequency of Communication with Friends and Family: Not on file  . Frequency of Social Gatherings with Friends and Family: Not on file  . Attends Religious Services: Not on file  . Active Member of Clubs or Organizations: Not on file  . Attends Archivist Meetings: Not on file  . Marital Status: Not on file  Intimate Partner Violence:   . Fear of Current or Ex-Partner: Not on file  . Emotionally Abused: Not on file  . Physically Abused: Not on file  . Sexually Abused: Not on file     Review of Systems: General: negative for chills, fever, night sweats or weight changes.  Cardiovascular: negative for chest pain, dyspnea on exertion, edema, orthopnea, palpitations, paroxysmal nocturnal dyspnea or shortness of breath Dermatological: negative for rash Respiratory: negative for cough or wheezing Urologic: negative for hematuria Abdominal: negative for nausea, vomiting, diarrhea, bright red blood per rectum, melena, or hematemesis Neurologic: negative for visual changes, syncope, or dizziness All other systems reviewed and are otherwise  negative except as noted above.    Blood pressure 104/74, pulse 64, temperature (!) 97.5 F (36.4 C), height 5\' 3"  (1.6 m), weight 134 lb (60.8 kg).  General appearance: alert and no distress Neck: no adenopathy, no JVD, supple, symmetrical, trachea midline, thyroid not enlarged, symmetric, no tenderness/mass/nodules and Left carotid and subclavian bruit Lungs: clear to auscultation bilaterally Heart: regular rate and rhythm, S1, S2 normal, no murmur, click, rub or gallop Extremities: extremities normal, atraumatic, no cyanosis or edema Pulses: 2+ and symmetric Skin: Skin color, texture, turgor normal. No rashes or lesions Neurologic: Alert and oriented X 3, normal strength and tone. Normal symmetric reflexes. Normal coordination  and gait  EKG not performed today  ASSESSMENT AND PLAN:   Left arm pain Ms. Nersesian was referred to me by Dr. Martinique because of the possibility of left subclavian artery stenosis.  She does have a differential blood pressure in upper extremities about 25 mm.  She had carotid Dopplers performed 08/17/2019 that did not show significantly elevated velocities in her left subclavian but did show atypical vertebral blood flow with a blood pressure differential of 18 mmHg.  She gets occasional crampiness in her left upper extremity but this is the exception rather than the rule.  At this point I do not feel compelled to pursue this.  Should her symptoms become more frequent and/or noticeable we could do a upper extremity CTA to further evaluate.  She does have a left subclavian and carotid bruit on exam.      Lorretta Harp MD [facility], [facility] 12/27/2019 10:19 AM

## 2019-12-27 NOTE — Assessment & Plan Note (Signed)
Patricia Peck was referred to me by Dr. Martinique because of the possibility of left subclavian artery stenosis.  She does have a differential blood pressure in upper extremities about 25 mm.  She had carotid Dopplers performed 08/17/2019 that did not show significantly elevated velocities in her left subclavian but did show atypical vertebral blood flow with a blood pressure differential of 18 mmHg.  She gets occasional crampiness in her left upper extremity but this is the exception rather than the rule.  At this point I do not feel compelled to pursue this.  Should her symptoms become more frequent and/or noticeable we could do a upper extremity CTA to further evaluate.  She does have a left subclavian and carotid bruit on exam.

## 2020-01-11 ENCOUNTER — Ambulatory Visit: Payer: Medicare PPO | Admitting: Family Medicine

## 2020-01-18 ENCOUNTER — Other Ambulatory Visit: Payer: Self-pay

## 2020-01-18 ENCOUNTER — Encounter: Payer: Self-pay | Admitting: Family Medicine

## 2020-01-18 ENCOUNTER — Ambulatory Visit (INDEPENDENT_AMBULATORY_CARE_PROVIDER_SITE_OTHER): Payer: Medicare PPO | Admitting: Family Medicine

## 2020-01-18 VITALS — BP 140/90 | HR 88 | Ht 63.0 in | Wt 134.0 lb

## 2020-01-18 DIAGNOSIS — M79622 Pain in left upper arm: Secondary | ICD-10-CM

## 2020-01-18 NOTE — Progress Notes (Signed)
   Rito Ehrlich, am serving as a Education administrator for Dr. [name].  Patricia Peck is a 74 y.o. female who presents to [place] at [facility] today for f/u of L upper arm pain x 5 months that she describes as dull, aching pain.  She was last seen by Dr. Georgina Snell on 12/07/19 and was provided w/ a HEP that focuses on RC and posterior scapular strengthening.  She also saw Cardiology on 12/27/19 to r/o left subclavian artery stenosis.  Since her last visit, pt reports she is feeling a lot better "amazed" at how much the exercises have helped.   Pertinent review of systems: No fevers or chills  Relevant historical information: History of subclavian stenosis atrophy of hypertension cardiovascular disease   Exam:  BP 140/90 (BP Location: Left Arm, Patient Position: Sitting, Cuff Size: Normal)   Pulse 88   Ht 5\' 3"  (1.6 m)   Wt 134 lb (60.8 kg)   SpO2 98%   BMI 23.74 kg/m  General: Well Developed, well nourished, and in no acute distress.   MSK: Left shoulder normal-appearing normal motion normal strength.  Nontender.      Assessment and Plan: 74 y.o. female with left shoulder pain significant improvement with home exercise program as taught in clinic at last visit by ATC.  She is effectively pain-free at this point.  Plan to continue home exercises to prevent pain from returning and recheck back with me as needed in the future.  Total encounter time 20 minutes including charting time date of service.  Discussed home exercise program    Discussed warning signs or symptoms. Please see discharge instructions. Patient expresses understanding.   The above documentation has been reviewed and is accurate and complete [name]

## 2020-01-18 NOTE — Patient Instructions (Signed)
Thank you for coming in today. Continue home exercises to some extent.  Please let me know if your pain returns.  I am happy to see you back at any time.

## 2020-02-24 ENCOUNTER — Other Ambulatory Visit: Payer: Self-pay | Admitting: Nurse Practitioner

## 2020-02-24 DIAGNOSIS — I480 Paroxysmal atrial fibrillation: Secondary | ICD-10-CM

## 2020-02-24 NOTE — Telephone Encounter (Signed)
Patient is calling and requesting a refill for Advair, Metoprolol and Levothyroxine sent [facility] on [address]. Pt has a TOC appointment on 5/12 with Dr. Ardell Isaacs. CB is [phone]

## 2020-02-25 ENCOUNTER — Other Ambulatory Visit: Payer: Self-pay | Admitting: Nurse Practitioner

## 2020-02-25 DIAGNOSIS — I480 Paroxysmal atrial fibrillation: Secondary | ICD-10-CM

## 2020-02-25 MED ORDER — METOPROLOL SUCCINATE ER 25 MG PO TB24: 75.0000 mg | ORAL_TABLET | Freq: Every day | ORAL | 0 refills | Status: DC

## 2020-02-25 MED ORDER — LEVOTHYROXINE SODIUM 50 MCG PO TABS: 50.0000 ug | ORAL_TABLET | Freq: Every day | ORAL | 0 refills | Status: DC

## 2020-02-25 MED ORDER — ADVAIR DISKUS 100-50 MCG/DOSE IN AEPB: 1.0000 | INHALATION_SPRAY | Freq: Two times a day (BID) | RESPIRATORY_TRACT | 0 refills | Status: DC

## 2020-03-12 ENCOUNTER — Encounter: Payer: Self-pay | Admitting: Family Medicine

## 2020-03-12 ENCOUNTER — Ambulatory Visit (INDEPENDENT_AMBULATORY_CARE_PROVIDER_SITE_OTHER): Payer: Medicare PPO | Admitting: Family Medicine

## 2020-03-12 ENCOUNTER — Ambulatory Visit: Payer: Self-pay

## 2020-03-12 ENCOUNTER — Other Ambulatory Visit: Payer: Self-pay

## 2020-03-12 ENCOUNTER — Ambulatory Visit (INDEPENDENT_AMBULATORY_CARE_PROVIDER_SITE_OTHER): Payer: Medicare PPO

## 2020-03-12 VITALS — BP 148/84 | HR 62 | Ht 63.0 in | Wt 135.2 lb

## 2020-03-12 DIAGNOSIS — M79672 Pain in left foot: Secondary | ICD-10-CM

## 2020-03-12 DIAGNOSIS — M76812 Anterior tibial syndrome, left leg: Secondary | ICD-10-CM | POA: Diagnosis not present

## 2020-03-12 DIAGNOSIS — M76822 Posterior tibial tendinitis, left leg: Secondary | ICD-10-CM | POA: Diagnosis not present

## 2020-03-12 NOTE — Progress Notes (Signed)
   I, Patricia Peck, LAT, ATC, am serving as scribe for Dr. [name].  Patricia Peck is a 74 y.o. female who presents to [place] at [facility] today for L foot pain x 6 months w no known MOI.  She was last seen by Dr. Georgina Snell on 01/18/20 for L upper arm pain.  She locates her L foot pain to her L medial foot both dorsal and along the arch.  She rates her pain as moderate and describes her pain as throbbing.  Radiating pain: No L foot swelling: No L foot numbness/tingling: No Aggravating factors: Unknown Treatments tried: Arch suppors/shoe inserts   Pertinent review of systems: No fevers or chills  Relevant historical information: History of prior plantar fasciitis has orthotics.   Exam:  BP (!) 148/84 (BP Location: Right Arm, Patient Position: Sitting, Cuff Size: Normal)   Pulse 62   Ht 5\' 3"  (1.6 m)   Wt 135 lb 3.2 oz (61.3 kg)   SpO2 97%   BMI 23.95 kg/m    General: Well Developed, well nourished, and in no acute distress.   MSK: Left foot bunion formation with degenerative appearing ankle and foot. No significant swelling. Tender palpation at medial midfoot near navicular prominence. Point tender palpation at insertion of posterior tibialis tendon and tibialis anterior tendon. Normal ankle and foot motion. Stable ligaments exam. Strength testing intact without significant exacerbation of pain. Pulses capillary fill and sensation are intact distally.    Lab and Radiology Results  X-ray images left foot obtained today personally and independently reviewed Mild DJD no acute fractures. Await formal radiology review   Assessment and Plan: 74 y.o. female with left foot pain ongoing for several months now.  Pain located at insertion of posterior tibialis tendon and tibialis anterior tendon.  Plan for arch strap eccentric exercises and Voltaren gel.  Additionally plan to refer to physical therapy.  Check back in about a month return sooner if needed.   If not better potential ultrasound and injection.  Radiology overread still pending.   PDMP not reviewed this encounter. Orders Placed This Encounter  Procedures  . DG Foot Complete Left    Standing Status:   Future    Number of Occurrences:   1    Standing Expiration Date:   05/12/2021    Order Specific Question:   Reason for Exam (SYMPTOM  OR DIAGNOSIS REQUIRED)    Answer:   medial foot pain    Order Specific Question:   Preferred imaging location?    Answer:   [name]    Order Specific Question:   Radiology Contrast Protocol - do NOT remove file path    Answer:   [email or web address]  . Ambulatory referral to Physical Therapy    Referral Priority:   Routine    Referral Type:   Physical Medicine    Referral Reason:   Specialty Services Required    Requested Specialty:   Physical Therapy    Number of Visits Requested:   1   No orders of the defined types were placed in this encounter.    Discussed warning signs or symptoms. Please see discharge instructions. Patient expresses understanding.   The above documentation has been reviewed and is accurate and complete [name]

## 2020-03-12 NOTE — Patient Instructions (Addendum)
Thank you for coming in today. Continue voltaren gel.  Use arch strap.  Try [employer].  [address] [place], [place] [address]  Attend PT.  Get xray today on your way out.  Recheck in 4 weeks.  Let me know sooner if not doing well.

## 2020-03-13 ENCOUNTER — Other Ambulatory Visit: Payer: Self-pay

## 2020-03-13 NOTE — Progress Notes (Signed)
Foot x-ray shows no fractures.  Bunion seen.  No severe arthritis in the foot.  If not improving would proceed with ultrasound and possibly injection.

## 2020-03-14 ENCOUNTER — Ambulatory Visit (INDEPENDENT_AMBULATORY_CARE_PROVIDER_SITE_OTHER): Payer: Medicare PPO | Admitting: Family Medicine

## 2020-03-14 ENCOUNTER — Encounter: Payer: Self-pay | Admitting: Family Medicine

## 2020-03-14 VITALS — BP 126/80 | HR 72 | Temp 97.2°F | Ht 63.0 in | Wt 134.2 lb

## 2020-03-14 DIAGNOSIS — I10 Essential (primary) hypertension: Secondary | ICD-10-CM

## 2020-03-14 DIAGNOSIS — I4891 Unspecified atrial fibrillation: Secondary | ICD-10-CM | POA: Diagnosis not present

## 2020-03-14 DIAGNOSIS — E038 Other specified hypothyroidism: Secondary | ICD-10-CM

## 2020-03-14 DIAGNOSIS — E785 Hyperlipidemia, unspecified: Secondary | ICD-10-CM | POA: Diagnosis not present

## 2020-03-14 NOTE — Progress Notes (Signed)
Patricia Peck is a 74 y.o. female  Chief Complaint  Patient presents with  . Establish Care    Pt here for TOC.    HPI: Patricia Peck is a 74 y.o. female seen today for [facility] appt, previous PCP Dr. Deborra Medina. 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  She has a PMHx significant for a-fib on xarelto, HTN, hyperlipidemia, hypothyroidism, osteoporosis, asthma, depression.  She follows with cardio Dr. Gwenlyn Found for a-fib, pulm Dr. Ander Slade for insomnia. She recently established with sports med Dr. Georgina Snell for Lt foot pain.  She does not need any med refills today. She does not have any acute issues/concerns. She feels she is doing well overall.   Past Medical History:  Diagnosis Date  . Allergy   . Arthritis    hands  . Asthma   . Atrial fibrillation ([place])   . Atrial flutter ([place])   . Depression   . Dysrhythmia    a-fib, on Xarelto  . GERD (gastroesophageal reflux disease)   . Hyperlipidemia   . Hypertension   . Hypothyroidism   . Osteoporosis   . TIA (transient ischemic attack)     Past Surgical History:  Procedure Laterality Date  . ABDOMINAL HYSTERECTOMY    . COLONOSCOPY    . MYRINGOTOMY WITH TUBE PLACEMENT Right 01/14/2016   Procedure: RIGHT MYRINGOTOMY WITH T-TUBE PLACEMENT;  Surgeon: Leta Baptist, MD;  Location: [facility];  Service: ENT;  Laterality: Right;  . PARTIAL HYSTERECTOMY  1989  . TEE WITHOUT CARDIOVERSION N/A 12/22/2012   Procedure: TRANSESOPHAGEAL ECHOCARDIOGRAM (TEE);  Surgeon: Lelon Perla, MD;  Location: [facility] ENDOSCOPY;  Service: Cardiovascular;  Laterality: N/A;    Social History   Socioeconomic History  . Marital status: Married    Spouse name: Not on file  . Number of children: 2  . Years of education: Not on file  . Highest education level: Not on file  Occupational History    Employer: RETIRED  Tobacco Use  . Smoking status: Never Smoker  . Smokeless  tobacco: Never Used  Substance and Sexual Activity  . Alcohol use: Yes    Comment: Rare  . Drug use: No  . Sexual activity: Yes  Other Topics Concern  . Not on file  Social History Narrative   Does have HPOA- sons Eddie Dibbles and Wille Glaser.   Does desire life support if not futile.   Not sure about feeding tubes.   Social Determinants of Health   Financial Resource Strain:   . Difficulty of Paying Living Expenses:   Food Insecurity:   . Worried About Charity fundraiser in the Last Year:   . Arboriculturist in the Last Year:   Transportation Needs:   . Film/video editor (Medical):   Marland Kitchen Lack of Transportation (Non-Medical):   Physical Activity:   . Days of Exercise per Week:   . Minutes of Exercise per Session:   Stress:   . Feeling of Stress :   Social Connections:   . Frequency of Communication with Friends and Family:   . Frequency of Social Gatherings with Friends and Family:   . Attends Religious Services:   . Active Member of Clubs or Organizations:   . Attends Archivist Meetings:   Marland Kitchen Marital Status:   Intimate Partner Violence:   . Fear of Current or Ex-Partner:   . Emotionally Abused:   .  Physically Abused:   . Sexually Abused:     Family History  Problem Relation Age of Onset  . Breast cancer Mother   . Drug abuse Son   . Depression Son   . Stroke Paternal Aunt   . Heart disease Paternal Grandfather   . Diabetes Maternal Grandfather      Immunization History  Administered Date(s) Administered  . Fluad Quad(high Dose 65+) 06/30/2019  . Influenza Split 09/04/2011, 08/11/2012, 08/04/2017  . Influenza Whole 08/07/2009  . Influenza, High Dose Seasonal PF 08/28/2014, 06/29/2019  . Influenza,inj,Quad PF,6+ Mos 08/24/2013, 07/20/2015, 07/29/2016, 08/04/2017  . Influenza-Unspecified 07/10/2018  . PFIZER SARS-COV-2 Vaccination 11/23/2019, 01/04/2020  . Pneumococcal Conjugate-13 04/12/2014  . Pneumococcal Polysaccharide-23 02/05/2006, 04/08/2012  . Td  01/29/2005, 04/17/2015  . Zoster 04/16/2007  . Zoster Recombinat (Shingrix) 02/09/2018    Outpatient Encounter Medications as of 03/14/2020  Medication Sig  . ADVAIR DISKUS 100-50 MCG/DOSE AEPB Inhale 1 puff into the lungs in the morning and at bedtime. Maintain upcoming appt for additional refill  . Alpha-D-Galactosidase (BEANO PO) Take by mouth 2 (two) times daily.  Marland Kitchen atorvastatin (LIPITOR) 20 MG tablet TAKE 1 TABLET BY MOUTH DAILY.  . bumetanide (BUMEX) 1 MG tablet Take 1 tablet (1 mg total) by mouth 2 (two) times daily.  Marland Kitchen buPROPion (WELLBUTRIN XL) 150 MG 24 hr tablet TAKE 1 TABLET BY MOUTH DAILY.  . Calcium Carbonate-Vitamin D (CALCIUM 600/VITAMIN D) 600-400 MG-UNIT per tablet Take 1 tablet by mouth 2 (two) times daily.    . clonazePAM (KLONOPIN) 0.5 MG tablet TAKE 1 TABLET BY MOUTH AT BEDTIME AS NEEDED FOR ANXIETY  . denosumab (PROLIA) 60 MG/ML SOLN injection Inject 60 mg into the skin once. Administer in upper arm, thigh, or abdomen  . diclofenac sodium (VOLTAREN) 1 % GEL Apply 2g to affected area qid prn  . famotidine (PEPCID AC) 10 MG chewable tablet Chew 1 tablet (10 mg total) by mouth daily.  Marland Kitchen levocetirizine (XYZAL) 5 MG tablet Take 5 mg by mouth daily.  Marland Kitchen levothyroxine (SYNTHROID) 50 MCG tablet Take 1 tablet (50 mcg total) by mouth daily. Maintain upcoming appt for additional refill  . metoprolol succinate (TOPROL-XL) 25 MG 24 hr tablet Take 3 tablets (75 mg total) by mouth daily. Maintain upcoming appt for additional refill  . NEXIUM 40 MG capsule Take 1 capsule (40 mg total) by mouth daily.  Marland Kitchen nystatin (MYCOSTATIN) 100000 UNIT/ML suspension SWISH BY MOUTH 10 ML UP TO 4 TIMES A DAY  . potassium chloride (KLOR-CON) 10 MEQ tablet TAKE 1 TABLET BY MOUTH 2 TIMES DAILY.  Marland Kitchen Rivaroxaban (XARELTO) 15 MG TABS tablet Take 1 tablet (15 mg total) by mouth daily with supper.  . Suvorexant (BELSOMRA) 20 MG TABS Take 1 tablet by mouth at bedtime.  . Bepotastine Besilate (BEPREVE) 1.5 % SOLN  Use one drop in each eye twice a day  . Cholecalciferol (VITAMIN D-3) 5000 UNITS TABS Take one tablet by mouth three times a week   No facility-administered encounter medications on file as of 03/14/2020.     ROS: Pertinent positives and negatives noted in HPI. Remainder of ROS non-contributory    No Known Allergies  BP 126/80 (BP Location: Right Arm, Patient Position: Sitting, Cuff Size: Normal)   Pulse 72   Temp (!) 97.2 F (36.2 C) (Temporal)   Ht 5\' 3"  (1.6 m)   Wt 134 lb 3.2 oz (60.9 kg)   SpO2 97%   BMI 23.77 kg/m   Physical Exam  Constitutional:  She is oriented to person, place, and time. She appears well-developed and well-nourished. No distress.  Cardiovascular: Normal rate, normal heart sounds and intact distal pulses.  Pulmonary/Chest: Effort normal and breath sounds normal.  Neurological: She is alert and oriented to person, place, and time.  Psychiatric: She has a normal mood and affect. Her behavior is normal.     A/P:  1. Atrial fibrillation, unspecified type ([place]) - stable, follows with cardio - cont xarelto  2. Other specified hypothyroidism - TFTs WNL in 05/2019 - cont with levothyroxine 54mcg daily  3. Essential hypertension - controlled, at goal - cont current meds  4. Hyperlipidemia, unspecified hyperlipidemia type - on lipitor  - due for FLP at CPE appt  Due in 9/20201 for CPE   This visit occurred during the SARS-CoV-2 public health emergency.  Safety protocols were in place, including screening questions prior to the visit, additional usage of staff PPE, and extensive cleaning of exam room while observing appropriate contact time as indicated for disinfecting solutions.

## 2020-03-19 NOTE — Progress Notes (Signed)
HPI: FU atrial fibrillation and TIA. Echocardiogram in January of 2014 showed normal LV function, mild left ventricular hypertrophy, mild aortic insufficiency. A patent foramen ovale could not be excluded. Brain MRI in January 2014 showed no acute infarct, mild small vessel disease. TEE 2/14 showed normal LV function, mild mitral valve prolapse with mild mitral regurgitation,  trace aortic insufficiency. There was no PFO. Monitor in March of 2014 showed paroxysmal atrial fibrillation/flutter. She was therefore started on anticoagulation. Carotid Dopplers 10/20 showed 1-39% bilateral stenosis; left subclavian stenosis noted. Cardiac CTA October 2020 showed calcium score 7 but no obstructive coronary disease.  Since I last saw her patient denies dyspnea, chest pain, palpitations or syncope.  No bleeding.  Current Outpatient Medications  Medication Sig Dispense Refill  . ADVAIR DISKUS 100-50 MCG/DOSE AEPB Inhale 1 puff into the lungs in the morning and at bedtime. Maintain upcoming appt for additional refill 60 each 0  . Alpha-D-Galactosidase (BEANO PO) Take by mouth 2 (two) times daily.    Marland Kitchen atorvastatin (LIPITOR) 20 MG tablet TAKE 1 TABLET BY MOUTH DAILY. 90 tablet 1  . Bepotastine Besilate (BEPREVE) 1.5 % SOLN Use one drop in each eye twice a day    . bumetanide (BUMEX) 1 MG tablet Take 1 tablet (1 mg total) by mouth 2 (two) times daily. 180 tablet 1  . buPROPion (WELLBUTRIN XL) 150 MG 24 hr tablet TAKE 1 TABLET BY MOUTH DAILY. 90 tablet 1  . Calcium Carbonate-Vitamin D (CALCIUM 600/VITAMIN D) 600-400 MG-UNIT per tablet Take 1 tablet by mouth 2 (two) times daily.      . Cholecalciferol (VITAMIN D-3) 5000 UNITS TABS Take one tablet by mouth three times a week    . clonazePAM (KLONOPIN) 0.5 MG tablet TAKE 1 TABLET BY MOUTH AT BEDTIME AS NEEDED FOR ANXIETY 30 tablet 0  . denosumab (PROLIA) 60 MG/ML SOLN injection Inject 60 mg into the skin once. Administer in upper arm, thigh, or abdomen 180 mL  0  . diclofenac sodium (VOLTAREN) 1 % GEL Apply 2g to affected area qid prn 100 g 3  . famotidine (PEPCID AC) 10 MG chewable tablet Chew 1 tablet (10 mg total) by mouth daily. 90 tablet prm  . levocetirizine (XYZAL) 5 MG tablet Take 5 mg by mouth daily.  1  . levothyroxine (SYNTHROID) 50 MCG tablet Take 1 tablet (50 mcg total) by mouth daily. Maintain upcoming appt for additional refill 30 tablet 0  . metoprolol succinate (TOPROL-XL) 25 MG 24 hr tablet Take 3 tablets (75 mg total) by mouth daily. Maintain upcoming appt for additional refill 90 tablet 0  . NEXIUM 40 MG capsule Take 1 capsule (40 mg total) by mouth daily. 90 capsule 1  . nystatin (MYCOSTATIN) 100000 UNIT/ML suspension SWISH BY MOUTH 10 ML UP TO 4 TIMES A DAY 240 mL 1  . potassium chloride (KLOR-CON) 10 MEQ tablet TAKE 1 TABLET BY MOUTH 2 TIMES DAILY. 180 tablet 1  . Rivaroxaban (XARELTO) 15 MG TABS tablet Take 1 tablet (15 mg total) by mouth daily with supper. 90 tablet 1  . Suvorexant (BELSOMRA) 20 MG TABS Take 1 tablet by mouth at bedtime. 30 tablet 5   No current facility-administered medications for this visit.     Past Medical History:  Diagnosis Date  . Allergy   . Arthritis    hands  . Asthma   . Atrial fibrillation ([place])   . Atrial flutter ([place])   . Depression   . Dysrhythmia  a-fib, on Xarelto  . GERD (gastroesophageal reflux disease)   . Hyperlipidemia   . Hypertension   . Hypothyroidism   . Osteoporosis   . TIA (transient ischemic attack)     Past Surgical History:  Procedure Laterality Date  . ABDOMINAL HYSTERECTOMY    . COLONOSCOPY    . MYRINGOTOMY WITH TUBE PLACEMENT Right 01/14/2016   Procedure: RIGHT MYRINGOTOMY WITH T-TUBE PLACEMENT;  Surgeon: Leta Baptist, MD;  Location: [facility];  Service: ENT;  Laterality: Right;  . PARTIAL HYSTERECTOMY  1989  . TEE WITHOUT CARDIOVERSION N/A 12/22/2012   Procedure: TRANSESOPHAGEAL ECHOCARDIOGRAM (TEE);  Surgeon: Lelon Perla, MD;  Location:  [facility] ENDOSCOPY;  Service: Cardiovascular;  Laterality: N/A;    Social History   Socioeconomic History  . Marital status: Married    Spouse name: Not on file  . Number of children: 2  . Years of education: Not on file  . Highest education level: Not on file  Occupational History    Employer: RETIRED  Tobacco Use  . Smoking status: Never Smoker  . Smokeless tobacco: Never Used  Substance and Sexual Activity  . Alcohol use: Yes    Comment: Rare  . Drug use: No  . Sexual activity: Yes  Other Topics Concern  . Not on file  Social History Narrative   Does have HPOA- sons Eddie Dibbles and Wille Glaser.   Does desire life support if not futile.   Not sure about feeding tubes.   Social Determinants of Health   Financial Resource Strain:   . Difficulty of Paying Living Expenses:   Food Insecurity:   . Worried About Charity fundraiser in the Last Year:   . Arboriculturist in the Last Year:   Transportation Needs:   . Film/video editor (Medical):   Marland Kitchen Lack of Transportation (Non-Medical):   Physical Activity:   . Days of Exercise per Week:   . Minutes of Exercise per Session:   Stress:   . Feeling of Stress :   Social Connections:   . Frequency of Communication with Friends and Family:   . Frequency of Social Gatherings with Friends and Family:   . Attends Religious Services:   . Active Member of Clubs or Organizations:   . Attends Archivist Meetings:   Marland Kitchen Marital Status:   Intimate Partner Violence:   . Fear of Current or Ex-Partner:   . Emotionally Abused:   Marland Kitchen Physically Abused:   . Sexually Abused:     Family History  Problem Relation Age of Onset  . Breast cancer Mother   . Drug abuse Son   . Depression Son   . Stroke Paternal Aunt   . Heart disease Paternal Grandfather   . Diabetes Maternal Grandfather     ROS: no fevers or chills, productive cough, hemoptysis, dysphasia, odynophagia, melena, hematochezia, dysuria, hematuria, rash, seizure activity, orthopnea,  PND, pedal edema, claudication. Remaining systems are negative.  Physical Exam: Well-developed well-nourished in no acute distress.  Skin is warm and dry.  HEENT is normal.  Neck is supple.  Chest is clear to auscultation with normal expansion.  Cardiovascular exam is irregular Abdominal exam nontender or distended. No masses palpated. Extremities show no edema. neuro grossly intact  Electrocardiogram today shows atrial fibrillation at a rate of 85, personally reviewed.   A/P  1 paroxysmal atrial fibrillation-patient is in atrial fibrillation today.  We discussed options today including rhythm control versus rate control.  She is completely  asymptomatic.  I would therefore favor rate control.  Continue Toprol but increase to 50 mg daily.  Check 24-hour Holter monitor to make sure that rate is adequately controlled.  Continue Xarelto.  Check hemoglobin and renal function.  2 hypertension-blood pressure is borderline.  Continue present medications and follow.  3 hyperlipidemia-continue statin.  Check lipids and liver.  4 carotid artery disease-mild on most recent Dopplers.  Continue statin.  5 subclavian stenosis-previously seen by Dr. Gwenlyn Found.  Medical therapy recommended unless symptoms worsen.  Kirk Ruths, MD

## 2020-03-26 ENCOUNTER — Encounter: Payer: Self-pay | Admitting: Cardiology

## 2020-03-26 ENCOUNTER — Encounter: Payer: Self-pay | Admitting: *Deleted

## 2020-03-26 ENCOUNTER — Ambulatory Visit (INDEPENDENT_AMBULATORY_CARE_PROVIDER_SITE_OTHER): Payer: Medicare PPO | Admitting: Cardiology

## 2020-03-26 ENCOUNTER — Other Ambulatory Visit: Payer: Self-pay

## 2020-03-26 VITALS — BP 140/90 | HR 61 | Ht 64.0 in | Wt 132.0 lb

## 2020-03-26 DIAGNOSIS — I10 Essential (primary) hypertension: Secondary | ICD-10-CM

## 2020-03-26 DIAGNOSIS — E785 Hyperlipidemia, unspecified: Secondary | ICD-10-CM

## 2020-03-26 DIAGNOSIS — I480 Paroxysmal atrial fibrillation: Secondary | ICD-10-CM | POA: Diagnosis not present

## 2020-03-26 LAB — LIPID PANEL
Chol/HDL Ratio: 4.9 ratio — ABNORMAL HIGH (ref 0.0–4.4)
Cholesterol, Total: 194 mg/dL (ref 100–199)
HDL: 40 mg/dL (ref >39)
LDL Chol Calc (NIH): 108 mg/dL — ABNORMAL HIGH (ref 0–99)
Triglycerides: 265 mg/dL — ABNORMAL HIGH (ref 0–149)
VLDL Cholesterol Cal: 46 mg/dL — ABNORMAL HIGH (ref 5–40)

## 2020-03-26 LAB — COMPREHENSIVE METABOLIC PANEL
ALT: 13 IU/L (ref 0–32)
AST: 14 IU/L (ref 0–40)
Albumin/Globulin Ratio: 1.7 (ref 1.2–2.2)
Albumin: 4.6 g/dL (ref 3.7–4.7)
Alkaline Phosphatase: 92 IU/L (ref 48–121)
BUN/Creatinine Ratio: 19 (ref 12–28)
BUN: 20 mg/dL (ref 8–27)
Bilirubin Total: 0.6 mg/dL (ref 0.0–1.2)
CO2: 24 mmol/L (ref 20–29)
Calcium: 9.8 mg/dL (ref 8.7–10.3)
Chloride: 100 mmol/L (ref 96–106)
Creatinine, Ser: 1.07 mg/dL — ABNORMAL HIGH (ref 0.57–1.00)
GFR calc Af Amer: 59 mL/min/{1.73_m2} — ABNORMAL LOW (ref >59)
GFR calc non Af Amer: 51 mL/min/{1.73_m2} — ABNORMAL LOW (ref >59)
Globulin, Total: 2.7 g/dL (ref 1.5–4.5)
Glucose: 104 mg/dL — ABNORMAL HIGH (ref 65–99)
Potassium: 3.5 mmol/L (ref 3.5–5.2)
Sodium: 142 mmol/L (ref 134–144)
Total Protein: 7.3 g/dL (ref 6.0–8.5)

## 2020-03-26 LAB — CBC
Hematocrit: 43.7 % (ref 34.0–46.6)
Hemoglobin: 14.6 g/dL (ref 11.1–15.9)
MCH: 29.3 pg (ref 26.6–33.0)
MCHC: 33.4 g/dL (ref 31.5–35.7)
MCV: 88 fL (ref 79–97)
Platelets: 359 10*3/uL (ref 150–450)
RBC: 4.98 x10E6/uL (ref 3.77–5.28)
RDW: 12.8 % (ref 11.7–15.4)
WBC: 10.3 10*3/uL (ref 3.4–10.8)

## 2020-03-26 MED ORDER — METOPROLOL SUCCINATE ER 50 MG PO TB24: 50.0000 mg | ORAL_TABLET | Freq: Every day | ORAL | 3 refills | Status: DC

## 2020-03-26 NOTE — Progress Notes (Signed)
Patient ID: Patricia Peck, female   DOB: July 25, 1946, 74 y.o.   MRN: [id number] 3 day ZIO XT shipped to patients home.

## 2020-03-26 NOTE — Patient Instructions (Signed)
Medication Instructions:   INCREASE METOPROLOL TO 50 MG ONCE DAILY= 2 OF THE 25 MG TABLETS ONCE DAILY  *If you need a refill on your cardiac medications before your next appointment, please call your pharmacy*   Lab Work: Your physician recommends that you HAVE LAB WORK TODAY  If you have labs (blood work) drawn today and your tests are completely normal, you will receive your results only by: Marland Kitchen MyChart Message (if you have MyChart) OR . A paper copy in the mail If you have any lab test that is abnormal or we need to change your treatment, we will call you to review the results.   Testing/Procedures:  [facility]- Long Term Monitor Instructions   Your physician has requested you wear your ZIO patch monitor____3___days.   This is a single patch monitor.  [employer] supplies one patch monitor per enrollment.  Additional stickers are not available.   Please do not apply patch if you will be having a Nuclear Stress Test, Echocardiogram, Cardiac CT, MRI, or Chest Xray during the time frame you would be wearing the monitor. The patch cannot be worn during these tests.  You cannot remove and re-apply the ZIO XT patch monitor.   Your ZIO patch monitor will be sent USPS Priority mail from [facility] directly to your home address. The monitor may also be mailed to a PO BOX if home delivery is not available.   It may take 3-5 days to receive your monitor after you have been enrolled.   Once you have received you monitor, please review enclosed instructions.  Your monitor has already been registered assigning a specific monitor serial # to you.   Applying the monitor   Shave hair from upper left chest.   Hold abrader disc by orange tab.  Rub abrader in 40 strokes over left upper chest as indicated in your monitor instructions.   Clean area with 4 enclosed alcohol pads .  Use all pads to assure are is cleaned thoroughly.  Let dry.   Apply patch as indicated in monitor instructions.  Patch  will be place under collarbone on left side of chest with arrow pointing upward.   Rub patch adhesive wings for 2 minutes.Remove white label marked "1".  Remove white label marked "2".  Rub patch adhesive wings for 2 additional minutes.   While looking in a mirror, press and release button in center of patch.  A small green light will flash 3-4 times .  This will be your only indicator the monitor has been turned on.     Do not shower for the first 24 hours.  You may shower after the first 24 hours.   Press button if you feel a symptom. You will hear a small click.  Record Date, Time and Symptom in the Patient Log Book.   When you are ready to remove patch, follow instructions on last 2 pages of Patient Log Book.  Stick patch monitor onto last page of Patient Log Book.   Place Patient Log Book in [place] box.  Use locking tab on box and tape box closed securely.  The [employer] has [employer] on it.  Please place in mailbox as soon as possible.  Your physician should have your test results approximately 7 days after the monitor has been mailed back to [facility].   Call [facility] at [phone] if you have questions regarding your ZIO XT patch monitor.  Call them immediately if you  see an orange light blinking on your monitor.   If your monitor falls off in less than 4 days contact our Monitor department at [phone].  If your monitor becomes loose or falls off after 4 days call [employer] at [phone] for suggestions on securing your monitor.     Follow-Up: At [facility], you and your health needs are our priority.  As part of our continuing mission to provide you with exceptional heart care, we have created designated Provider Care Teams.  These Care Teams include your primary Cardiologist (physician) and Advanced Practice Providers (APPs -  Physician Assistants and Nurse Practitioners) who all work together to provide you with the care you need,  when you need it.  We recommend signing up for the patient portal called "MyChart".  Sign up information is provided on this After Visit Summary.  MyChart is used to connect with patients for Virtual Visits (Telemedicine).  Patients are able to view lab/test results, encounter notes, upcoming appointments, etc.  Non-urgent messages can be sent to your provider as well.   To learn more about what you can do with MyChart, go to [email or web address].    Your next appointment:   6 month(s)  The format for your next appointment:   Either In Person or Virtual  Provider:   You may see Kirk Ruths, MD or one of the following Advanced Practice Providers on your designated Care Team:    Kerin Ransom, PA-C  [place], [place]  Coletta Memos,

## 2020-03-27 ENCOUNTER — Telehealth: Payer: Self-pay | Admitting: *Deleted

## 2020-03-27 DIAGNOSIS — E7800 Pure hypercholesterolemia, unspecified: Secondary | ICD-10-CM

## 2020-03-27 MED ORDER — ATORVASTATIN CALCIUM 40 MG PO TABS: 40.0000 mg | ORAL_TABLET | Freq: Every day | ORAL | 3 refills | Status: DC

## 2020-03-27 NOTE — Telephone Encounter (Signed)
Results released to my chart  °New script sent to the pharmacy  °Lab orders mailed to the pt  °

## 2020-03-27 NOTE — Telephone Encounter (Signed)
-----   Message from Lelon Perla, MD sent at 03/26/2020  5:11 PM EDT ----- Change lipitor to 40 mg daily; lipids and liver 12 weeks Kirk Ruths

## 2020-04-01 ENCOUNTER — Ambulatory Visit (INDEPENDENT_AMBULATORY_CARE_PROVIDER_SITE_OTHER): Payer: Medicare PPO

## 2020-04-01 DIAGNOSIS — I480 Paroxysmal atrial fibrillation: Secondary | ICD-10-CM

## 2020-04-04 ENCOUNTER — Ambulatory Visit (INDEPENDENT_AMBULATORY_CARE_PROVIDER_SITE_OTHER)
Admission: RE | Admit: 2020-04-04 | Discharge: 2020-04-04 | Disposition: A | Discharge status: Home / Self Care | Payer: Medicare PPO | Source: Ambulatory Visit | Attending: Family Medicine | Admitting: Family Medicine

## 2020-04-04 ENCOUNTER — Other Ambulatory Visit: Payer: Self-pay

## 2020-04-04 DIAGNOSIS — E2839 Other primary ovarian failure: Secondary | ICD-10-CM

## 2020-04-06 ENCOUNTER — Telehealth: Payer: Self-pay | Admitting: Cardiology

## 2020-04-06 NOTE — Telephone Encounter (Signed)
Spoke with pt, she forgot to mention that earlier this week she had nausea and vomited a large amount of mucus and phlegm. She has also had episodes of diarrhea this week and she feels she may have a virus. She will try taking the metoprolol in the evening to see if that will help with her fatigue and sluggish feeling. She will continue to monitor her bp. Aware dr Stanford Breed is not here to day but will forward for his revioew, medications confirmed.

## 2020-04-06 NOTE — Telephone Encounter (Signed)
Spoke with pt who report since 5/24 visit she has noticed an increase in BP. She also report for the past week she's been experiencing lack of energy and feeling real sluggish. Pt denies any other symptoms and recent BP/HR listed below.    6/3-146/87 HR 91 6/3-169/93 HR 75 6/4- 167/96 HR 71  Will route to MD for recommendations.

## 2020-04-06 NOTE — Telephone Encounter (Signed)
[name] BP issue: STAT if pt c/o blurred vision, one-sided weakness or slurred speech  1. What are your last 5 BP readings?  167/96 HR 71 146/87 HR 91 169/93 HR 75   2. Are you having any other symptoms (ex. Dizziness, headache, blurred vision, passed out)? No symptoms   3. What is your BP issue? She states her BP medication was changed last week it was increased, but she is still having high BP.

## 2020-04-07 NOTE — Telephone Encounter (Signed)
Add amlodipine 5 mg daily and follow BP [name]  

## 2020-04-09 ENCOUNTER — Encounter: Payer: Self-pay | Admitting: Family Medicine

## 2020-04-09 ENCOUNTER — Ambulatory Visit (INDEPENDENT_AMBULATORY_CARE_PROVIDER_SITE_OTHER): Payer: Medicare PPO | Admitting: Family Medicine

## 2020-04-09 ENCOUNTER — Other Ambulatory Visit: Payer: Self-pay

## 2020-04-09 VITALS — BP 94/62 | HR 63 | Ht 64.0 in | Wt 131.4 lb

## 2020-04-09 DIAGNOSIS — M76812 Anterior tibial syndrome, left leg: Secondary | ICD-10-CM | POA: Diagnosis not present

## 2020-04-09 DIAGNOSIS — M76822 Posterior tibial tendinitis, left leg: Secondary | ICD-10-CM | POA: Diagnosis not present

## 2020-04-09 DIAGNOSIS — M79672 Pain in left foot: Secondary | ICD-10-CM | POA: Diagnosis not present

## 2020-04-09 MED ORDER — AMLODIPINE BESYLATE 5 MG PO TABS: 5.0000 mg | ORAL_TABLET | Freq: Every day | ORAL | 3 refills | Status: DC

## 2020-04-09 NOTE — Telephone Encounter (Signed)
Left message for patient with Dr Creshaw's recommendations.   New script sent to the pharmacy  

## 2020-04-09 NOTE — Telephone Encounter (Signed)
Patient here and blood pressure machine does give the correct readings. New script sent to the correct pharmacy.

## 2020-04-09 NOTE — Addendum Note (Signed)
Addended by: Cristopher Estimable on: 04/09/2020 03:21 PM   Modules accepted: Orders

## 2020-04-09 NOTE — Telephone Encounter (Signed)
Follow up  ° ° °Pt is returning call  ° ° °Please call back  °

## 2020-04-09 NOTE — Progress Notes (Signed)
   I, [name], LAT, ATC, am serving as scribe for Dr. [name].  Patricia Peck is a 74 y.o. female who presents to [place] [facility] today for f/u of L foot pain / medial arch pain.  She was last seen by Dr. Georgina Snell on 03/12/20 and was referred to outpatient PT, advised to purchase an arch strap/support and to use Voltaren gel.  Since her last visit, pt reports that her L foot is feeling much better.  She has been using an arch strap.  She has completed 2 PT sessions and has 2 more scheduled.  She rates her improvement at 90%.  Diagnostic testing: L foot XR- 03/12/20   Pertinent review of systems: No fevers or chills  Relevant historical information: Subclavian artery stenosis left PAF osteoporosis hypothyroidism.   Exam:  BP 94/62 (BP Location: Right Arm, Patient Position: Sitting, Cuff Size: Normal)   Pulse 63   Ht 5\' 4"  (1.626 m)   Wt 131 lb 6.4 oz (59.6 kg)   SpO2 96%   BMI 22.55 kg/m  General: Well Developed, well nourished, and in no acute distress.   MSK: Left foot wearing arch strap.  Normal motion.  Normal gait.     Assessment and Plan: 74 y.o. female with left foot pain significant improvement with milled conservative management including physical therapy and arch strap.  Plan to continue home exercise program arch strap as needed.  Also recommend continuing Voltaren gel.  Recheck back with me if needed.     Discussed warning signs or symptoms. Please see discharge instructions. Patient expresses understanding.   The above documentation has been reviewed and is accurate and complete [name], M.D.   Total encounter time 20 minutes including charting time date of service. Treatment plan options next steps and backup plan.

## 2020-04-09 NOTE — Patient Instructions (Signed)
Thank you for coming in today. Plan for a bit more home exercise and PT.  Use the arch strap as needed.  Use voltaren gel as needed.  Recheck with me in needed in the future for this or other issues.

## 2020-04-09 NOTE — Telephone Encounter (Signed)
Spoke with pt, she was seen by her foot doctor this morning and they read her bp as 94/62 with 93 bpm pulse. When she got home she got a pressure of 162/93 with 90 bpm. Patient will come by the office this afternoon so we can correlate her bp pulse and then decide what to do.

## 2020-04-12 ENCOUNTER — Other Ambulatory Visit (INDEPENDENT_AMBULATORY_CARE_PROVIDER_SITE_OTHER): Payer: Medicare PPO

## 2020-04-12 DIAGNOSIS — I10 Essential (primary) hypertension: Secondary | ICD-10-CM | POA: Diagnosis not present

## 2020-04-24 ENCOUNTER — Other Ambulatory Visit: Payer: Self-pay

## 2020-04-25 ENCOUNTER — Ambulatory Visit (INDEPENDENT_AMBULATORY_CARE_PROVIDER_SITE_OTHER): Payer: Medicare PPO | Admitting: Family Medicine

## 2020-04-25 ENCOUNTER — Encounter: Payer: Self-pay | Admitting: Family Medicine

## 2020-04-25 VITALS — BP 102/82 | HR 65 | Temp 97.7°F | Ht 64.0 in

## 2020-04-25 DIAGNOSIS — G2581 Restless legs syndrome: Secondary | ICD-10-CM

## 2020-04-25 NOTE — Progress Notes (Signed)
Patricia Peck is a 74 y.o. female      Chief Complaint  Patient presents with  . restless leg syndrom    Pt siad through the night she is kicking her husband with her feet.  Pt is not in any pain today.    HPI: Patricia Peck is a 74 y.o. female who states her husband has been complaining the she is moving her legs and kicking him at night. Pt does not have any symptoms, sleeps well, does not know that she moves her legs. She denies any pain, swelling, tingling, numbness, etc. Symptoms x 1 year.       Past Medical History:  Diagnosis Date  . Allergy   . Arthritis    hands  . Asthma   . Atrial fibrillation ([place])   . Atrial flutter ([place])   . Depression   . Dysrhythmia    a-fib, on Xarelto  . GERD (gastroesophageal reflux disease)   . Hyperlipidemia   . Hypertension   . Hypothyroidism   . Osteoporosis   . TIA (transient ischemic attack)          Past Surgical History:  Procedure Laterality Date  . ABDOMINAL HYSTERECTOMY    . COLONOSCOPY    . MYRINGOTOMY WITH TUBE PLACEMENT Right 01/14/2016   Procedure: RIGHT MYRINGOTOMY WITH T-TUBE PLACEMENT;  Surgeon: Leta Baptist, MD;  Location: [facility];  Service: ENT;  Laterality: Right;  . PARTIAL HYSTERECTOMY  1989  . TEE WITHOUT CARDIOVERSION N/A 12/22/2012   Procedure: TRANSESOPHAGEAL ECHOCARDIOGRAM (TEE);  Surgeon: Lelon Perla, MD;  Location: [facility] ENDOSCOPY;  Service: Cardiovascular;  Laterality: N/A;    Social History        Socioeconomic History  . Marital status: Married    Spouse name: Not on file  . Number of children: 2  . Years of education: Not on file  . Highest education level: Not on file  Occupational History    Employer: RETIRED  Tobacco Use  . Smoking status: Never Smoker  . Smokeless tobacco: Never Used  Vaping Use  . Vaping Use: Never used  Substance and Sexual Activity  . Alcohol use: Yes    Comment: Rare  . Drug use: No  . Sexual  activity: Yes  Other Topics Concern  . Not on file  Social History Narrative   Does have HPOA- sons Eddie Dibbles and Wille Glaser.   Does desire life support if not futile.   Not sure about feeding tubes.   Social Determinants of Health      Financial Resource Strain:   . Difficulty of Paying Living Expenses:   Food Insecurity:   . Worried About Charity fundraiser in the Last Year:   . Arboriculturist in the Last Year:   Transportation Needs:   . Film/video editor (Medical):   Marland Kitchen Lack of Transportation (Non-Medical):   Physical Activity:   . Days of Exercise per Week:   . Minutes of Exercise per Session:   Stress:   . Feeling of Stress :   Social Connections:   . Frequency of Communication with Friends and Family:   . Frequency of Social Gatherings with Friends and Family:   . Attends Religious Services:   . Active Member of Clubs or Organizations:   . Attends Archivist Meetings:   Marland Kitchen Marital Status:   Intimate Partner Violence:   . Fear of Current or Ex-Partner:   . Emotionally Abused:   .  Physically Abused:   . Sexually Abused:          Family History  Problem Relation Age of Onset  . Breast cancer Mother   . Drug abuse Son   . Depression Son   . Stroke Paternal Aunt   . Heart disease Paternal Grandfather   . Diabetes Maternal Grandfather          Immunization History  Administered Date(s) Administered  . Fluad Quad(high Dose 65+) 06/30/2019  . Influenza Split 09/04/2011, 08/11/2012, 08/04/2017  . Influenza Whole 08/07/2009  . Influenza, High Dose Seasonal PF 08/28/2014, 06/29/2019  . Influenza,inj,Quad PF,6+ Mos 08/24/2013, 07/20/2015, 07/29/2016, 08/04/2017  . Influenza-Unspecified 07/10/2018  . PFIZER SARS-COV-2 Vaccination 11/23/2019, 01/04/2020  . Pneumococcal Conjugate-13 04/12/2014  . Pneumococcal Polysaccharide-23 02/05/2006, 04/08/2012  . Td 01/29/2005, 04/17/2015  . Zoster 04/16/2007  . Zoster Recombinat (Shingrix) 02/09/2018         Outpatient Encounter Medications as of 04/25/2020  Medication Sig  . ADVAIR DISKUS 100-50 MCG/DOSE AEPB Inhale 1 puff into the lungs in the morning and at bedtime. Maintain upcoming appt for additional refill  . Alpha-D-Galactosidase (BEANO PO) Take by mouth 2 (two) times daily.  Marland Kitchen amLODipine (NORVASC) 5 MG tablet Take 1 tablet (5 mg total) by mouth daily.  Marland Kitchen atorvastatin (LIPITOR) 40 MG tablet Take 1 tablet (40 mg total) by mouth daily.  . Bepotastine Besilate (BEPREVE) 1.5 % SOLN Use one drop in each eye twice a day  . bumetanide (BUMEX) 1 MG tablet Take 1 tablet (1 mg total) by mouth 2 (two) times daily.  Marland Kitchen buPROPion (WELLBUTRIN XL) 150 MG 24 hr tablet TAKE 1 TABLET BY MOUTH DAILY.  . Calcium Carbonate-Vitamin D (CALCIUM 600/VITAMIN D) 600-400 MG-UNIT per tablet Take 1 tablet by mouth 2 (two) times daily.    . Cholecalciferol (VITAMIN D-3) 5000 UNITS TABS Take one tablet by mouth three times a week  . clonazePAM (KLONOPIN) 0.5 MG tablet TAKE 1 TABLET BY MOUTH AT BEDTIME AS NEEDED FOR ANXIETY  . denosumab (PROLIA) 60 MG/ML SOLN injection Inject 60 mg into the skin once. Administer in upper arm, thigh, or abdomen  . diclofenac sodium (VOLTAREN) 1 % GEL Apply 2g to affected area qid prn  . famotidine (PEPCID AC) 10 MG chewable tablet Chew 1 tablet (10 mg total) by mouth daily.  Marland Kitchen ipratropium (ATROVENT) 0.06 % nasal spray   . levocetirizine (XYZAL) 5 MG tablet Take 5 mg by mouth daily.  Marland Kitchen levothyroxine (SYNTHROID) 50 MCG tablet Take 1 tablet (50 mcg total) by mouth daily. Maintain upcoming appt for additional refill  . metoprolol succinate (TOPROL-XL) 50 MG 24 hr tablet Take 1 tablet (50 mg total) by mouth daily. Maintain upcoming appt for additional refill  . NEXIUM 40 MG capsule Take 1 capsule (40 mg total) by mouth daily.  Marland Kitchen nystatin (MYCOSTATIN) 100000 UNIT/ML suspension SWISH BY MOUTH 10 ML UP TO 4 TIMES A DAY  . potassium chloride (KLOR-CON) 10 MEQ tablet TAKE 1 TABLET BY MOUTH  2 TIMES DAILY.  Marland Kitchen Rivaroxaban (XARELTO) 15 MG TABS tablet Take 1 tablet (15 mg total) by mouth daily with supper.  . Suvorexant (BELSOMRA) 20 MG TABS Take 1 tablet by mouth at bedtime.   No facility-administered encounter medications on file as of 04/25/2020.     ROS: Pertinent positives and negatives noted in HPI. Remainder of ROS non-contributory   No Known Allergies  BP 102/82 (BP Location: Left Arm, Patient Position: Sitting, Cuff Size: Normal)   Pulse 65  Temp 97.7 F (36.5 C) (Temporal)   Ht 5\' 4"  (1.626 m)   SpO2 97%   BMI 22.55 kg/m   Physical Exam Constitutional:      General: She is not in acute distress.    Appearance: Normal appearance. She is not toxic-appearing.  Musculoskeletal:     Right lower leg: No edema.     Left lower leg: No edema.  Skin:    General: Skin is warm and dry.  Neurological:     General: No focal deficit present.     Mental Status: She is alert and oriented to person, place, and time.     Sensory: No sensory deficit.     Coordination: Coordination normal.  Psychiatric:        Mood and Affect: Mood normal.        Behavior: Behavior normal.      A/P:  1. Restless legs - completely asymptomatic to patient, only bothersome to her husband because pt apparently moves and kicks legs throughout the night - unsure if med like requip would improve symptoms and pt is on a number of other medications. Will plan to check iron panel (ferritin) and if low recommend iron supplementation. If normal, will discuss medication trial further with pt - Iron, TIBC and Ferritin Panel - pt would like to be called with result, rather than contact via MyChart   This visit occurred during the SARS-CoV-2 public health emergency.  Safety protocols were in place, including screening questions prior to the visit, additional usage of staff PPE, and extensive cleaning of exam room while observing appropriate contact time as indicated for disinfecting  solutions.

## 2020-04-25 NOTE — Progress Notes (Deleted)
Patricia Peck is a 74 y.o. female  No chief complaint on file.   HPI: Patricia Peck is a 74 y.o. female who complains of   Past Medical History:  Diagnosis Date  . Allergy   . Arthritis    hands  . Asthma   . Atrial fibrillation ([place])   . Atrial flutter ([place])   . Depression   . Dysrhythmia    a-fib, on Xarelto  . GERD (gastroesophageal reflux disease)   . Hyperlipidemia   . Hypertension   . Hypothyroidism   . Osteoporosis   . TIA (transient ischemic attack)     Past Surgical History:  Procedure Laterality Date  . ABDOMINAL HYSTERECTOMY    . COLONOSCOPY    . MYRINGOTOMY WITH TUBE PLACEMENT Right 01/14/2016   Procedure: RIGHT MYRINGOTOMY WITH T-TUBE PLACEMENT;  Surgeon: Leta Baptist, MD;  Location: [facility];  Service: ENT;  Laterality: Right;  . PARTIAL HYSTERECTOMY  1989  . TEE WITHOUT CARDIOVERSION N/A 12/22/2012   Procedure: TRANSESOPHAGEAL ECHOCARDIOGRAM (TEE);  Surgeon: Lelon Perla, MD;  Location: [facility] ENDOSCOPY;  Service: Cardiovascular;  Laterality: N/A;    Social History   Socioeconomic History  . Marital status: Married    Spouse name: Not on file  . Number of children: 2  . Years of education: Not on file  . Highest education level: Not on file  Occupational History    Employer: RETIRED  Tobacco Use  . Smoking status: Never Smoker  . Smokeless tobacco: Never Used  Vaping Use  . Vaping Use: Never used  Substance and Sexual Activity  . Alcohol use: Yes    Comment: Rare  . Drug use: No  . Sexual activity: Yes  Other Topics Concern  . Not on file  Social History Narrative   Does have HPOA- sons Eddie Dibbles and Wille Glaser.   Does desire life support if not futile.   Not sure about feeding tubes.   Social Determinants of Health   Financial Resource Strain:   . Difficulty of Paying Living Expenses:   Food Insecurity:   . Worried About Charity fundraiser in the Last Year:   . Arboriculturist in the Last Year:   Transportation Needs:   .  Film/video editor (Medical):   Marland Kitchen Lack of Transportation (Non-Medical):   Physical Activity:   . Days of Exercise per Week:   . Minutes of Exercise per Session:   Stress:   . Feeling of Stress :   Social Connections:   . Frequency of Communication with Friends and Family:   . Frequency of Social Gatherings with Friends and Family:   . Attends Religious Services:   . Active Member of Clubs or Organizations:   . Attends Archivist Meetings:   Marland Kitchen Marital Status:   Intimate Partner Violence:   . Fear of Current or Ex-Partner:   . Emotionally Abused:   Marland Kitchen Physically Abused:   . Sexually Abused:     Family History  Problem Relation Age of Onset  . Breast cancer Mother   . Drug abuse Son   . Depression Son   . Stroke Paternal Aunt   . Heart disease Paternal Grandfather   . Diabetes Maternal Grandfather      Immunization History  Administered Date(s) Administered  . Fluad Quad(high Dose 65+) 06/30/2019  . Influenza Split 09/04/2011, 08/11/2012, 08/04/2017  . Influenza Whole 08/07/2009  . Influenza, High Dose Seasonal PF 08/28/2014, 06/29/2019  .  Influenza,inj,Quad PF,6+ Mos 08/24/2013, 07/20/2015, 07/29/2016, 08/04/2017  . Influenza-Unspecified 07/10/2018  . PFIZER SARS-COV-2 Vaccination 11/23/2019, 01/04/2020  . Pneumococcal Conjugate-13 04/12/2014  . Pneumococcal Polysaccharide-23 02/05/2006, 04/08/2012  . Td 01/29/2005, 04/17/2015  . Zoster 04/16/2007  . Zoster Recombinat (Shingrix) 02/09/2018    Outpatient Encounter Medications as of 04/25/2020  Medication Sig  . ADVAIR DISKUS 100-50 MCG/DOSE AEPB Inhale 1 puff into the lungs in the morning and at bedtime. Maintain upcoming appt for additional refill  . Alpha-D-Galactosidase (BEANO PO) Take by mouth 2 (two) times daily.  Marland Kitchen amLODipine (NORVASC) 5 MG tablet Take 1 tablet (5 mg total) by mouth daily.  Marland Kitchen atorvastatin (LIPITOR) 40 MG tablet Take 1 tablet (40 mg total) by mouth daily.  . Bepotastine Besilate  (BEPREVE) 1.5 % SOLN Use one drop in each eye twice a day  . bumetanide (BUMEX) 1 MG tablet Take 1 tablet (1 mg total) by mouth 2 (two) times daily.  Marland Kitchen buPROPion (WELLBUTRIN XL) 150 MG 24 hr tablet TAKE 1 TABLET BY MOUTH DAILY.  . Calcium Carbonate-Vitamin D (CALCIUM 600/VITAMIN D) 600-400 MG-UNIT per tablet Take 1 tablet by mouth 2 (two) times daily.    . Cholecalciferol (VITAMIN D-3) 5000 UNITS TABS Take one tablet by mouth three times a week  . clonazePAM (KLONOPIN) 0.5 MG tablet TAKE 1 TABLET BY MOUTH AT BEDTIME AS NEEDED FOR ANXIETY  . denosumab (PROLIA) 60 MG/ML SOLN injection Inject 60 mg into the skin once. Administer in upper arm, thigh, or abdomen  . diclofenac sodium (VOLTAREN) 1 % GEL Apply 2g to affected area qid prn  . famotidine (PEPCID AC) 10 MG chewable tablet Chew 1 tablet (10 mg total) by mouth daily.  Marland Kitchen levocetirizine (XYZAL) 5 MG tablet Take 5 mg by mouth daily.  Marland Kitchen levothyroxine (SYNTHROID) 50 MCG tablet Take 1 tablet (50 mcg total) by mouth daily. Maintain upcoming appt for additional refill  . metoprolol succinate (TOPROL-XL) 50 MG 24 hr tablet Take 1 tablet (50 mg total) by mouth daily. Maintain upcoming appt for additional refill  . NEXIUM 40 MG capsule Take 1 capsule (40 mg total) by mouth daily.  Marland Kitchen nystatin (MYCOSTATIN) 100000 UNIT/ML suspension SWISH BY MOUTH 10 ML UP TO 4 TIMES A DAY  . potassium chloride (KLOR-CON) 10 MEQ tablet TAKE 1 TABLET BY MOUTH 2 TIMES DAILY.  Marland Kitchen Rivaroxaban (XARELTO) 15 MG TABS tablet Take 1 tablet (15 mg total) by mouth daily with supper.  . Suvorexant (BELSOMRA) 20 MG TABS Take 1 tablet by mouth at bedtime.   No facility-administered encounter medications on file as of 04/25/2020.     ROS: Pertinent positives and negatives noted in HPI. Remainder of ROS non-contributory   No Known Allergies  There were no vitals taken for this visit.  Physical Exam Constitutional:      General: She is not in acute distress.    Appearance: Normal  appearance. She is not toxic-appearing.  Musculoskeletal:     Right lower leg: No edema.     Left lower leg: No edema.  Skin:    General: Skin is warm and dry.  Neurological:     General: No focal deficit present.     Mental Status: She is alert and oriented to person, place, and time.     Sensory: No sensory deficit.     Coordination: Coordination normal.  Psychiatric:        Mood and Affect: Mood normal.        Behavior: Behavior normal.  A/P:    This visit occurred during the SARS-CoV-2 public health emergency.  Safety protocols were in place, including screening questions prior to the visit, additional usage of staff PPE, and extensive cleaning of exam room while observing appropriate contact time as indicated for disinfecting solutions.

## 2020-04-26 ENCOUNTER — Other Ambulatory Visit: Payer: Self-pay | Admitting: Family Medicine

## 2020-04-26 LAB — IRON,TIBC AND FERRITIN PANEL
%SAT: 23 % (calc) (ref 16–45)
Ferritin: 38 ng/mL (ref 16–288)
Iron: 89 ug/dL (ref 45–160)
TIBC: 383 mcg/dL (calc) (ref 250–450)

## 2020-04-26 MED ORDER — ROPINIROLE HCL 0.25 MG PO TABS: ORAL_TABLET | ORAL | 0 refills | Status: DC

## 2020-04-30 ENCOUNTER — Other Ambulatory Visit: Payer: Self-pay

## 2020-04-30 ENCOUNTER — Ambulatory Visit
Admission: RE | Admit: 2020-04-30 | Discharge: 2020-04-30 | Disposition: A | Discharge status: Home / Self Care | Payer: Medicare PPO | Source: Ambulatory Visit | Attending: Family Medicine | Admitting: Family Medicine

## 2020-04-30 DIAGNOSIS — Z1231 Encounter for screening mammogram for malignant neoplasm of breast: Secondary | ICD-10-CM

## 2020-05-08 ENCOUNTER — Telehealth: Payer: Self-pay | Admitting: Family Medicine

## 2020-05-08 NOTE — Telephone Encounter (Signed)
I tried to call pt but the call is not going through.  No VM.

## 2020-05-08 NOTE — Telephone Encounter (Signed)
Please see message and advise.  Thank you. ° °

## 2020-05-08 NOTE — Telephone Encounter (Signed)
Patient called back to give updated number to call back regarding previous message left. CB is [phone]

## 2020-05-08 NOTE — Telephone Encounter (Signed)
Patient is calling to speak to her provider regarding the new shingles vaccine. She states that [facility] has a new Shingles vaccine and she wants to know if she should get it. Please call her back at [phone] and advise.  Thank you, Patricia Peck

## 2020-05-08 NOTE — Telephone Encounter (Signed)
Immunization History  Administered Date(s) Administered  . Fluad Quad(high Dose 65+) 06/30/2019  . Influenza Split 09/04/2011, 08/11/2012, 08/04/2017  . Influenza Whole 08/07/2009  . Influenza, High Dose Seasonal PF 08/28/2014, 06/29/2019  . Influenza,inj,Quad PF,6+ Mos 08/24/2013, 07/20/2015, 07/29/2016, 08/04/2017  . Influenza-Unspecified 07/10/2018  . PFIZER SARS-COV-2 Vaccination 11/23/2019, 01/04/2020  . Pneumococcal Conjugate-13 04/12/2014  . Pneumococcal Polysaccharide-23 02/05/2006, 04/08/2012  . Td 01/29/2005, 04/17/2015  . Zoster 04/16/2007  . Zoster Recombinat (Shingrix) 02/09/2018    Pt has had 1 dose of the shingrix vaccine on 02/09/2018 so she needs dose #2. Ok to get at pharmacy or our office.

## 2020-05-09 NOTE — Telephone Encounter (Signed)
Pt aware of Dr. Vivia Ewing recommendation.

## 2020-05-10 ENCOUNTER — Telehealth: Payer: Self-pay | Admitting: Family Medicine

## 2020-05-10 NOTE — Telephone Encounter (Signed)
Left message for patient to schedule Annual Wellness Visit.  Please schedule with Nurse Health Advisor Martha Stanley, RN at [place] [facility]  °

## 2020-05-14 ENCOUNTER — Other Ambulatory Visit: Payer: Self-pay

## 2020-05-15 ENCOUNTER — Ambulatory Visit: Payer: Medicare PPO

## 2020-05-15 ENCOUNTER — Telehealth: Payer: Self-pay

## 2020-05-15 DIAGNOSIS — M810 Age-related osteoporosis without current pathological fracture: Secondary | ICD-10-CM

## 2020-05-15 NOTE — Telephone Encounter (Signed)
Dr. Dierdre Highman pt did come in for her Prolia but we couldn't locate it to give it to her so we did give her a [employer] gift card. Pt was told when it did come in she would be called to schedule to come in an receive it.

## 2020-05-15 NOTE — Telephone Encounter (Signed)
Ronnie please advice.  Pt came in today for her Prolia injection but we could not locate her Prolia injection in office so we issued pt a 5 dollar gift card to [employer], an she was told that she would be called and scheduled as soon as it comes in.

## 2020-05-18 ENCOUNTER — Telehealth: Payer: Self-pay | Admitting: Behavioral Health

## 2020-05-18 NOTE — Telephone Encounter (Signed)
Received Summary of Benefits for Prolia injection. The estimated out of pocket expense is $0. PA is required and has been approved for the following dates of service: # [id number] 11/16/2019-11/02/2020.  Attempted to reach patient to discuss the above information and schedule nurse visit for injection; no answer at this time. Left message for patient to return call when available.

## 2020-05-21 ENCOUNTER — Other Ambulatory Visit: Payer: Self-pay

## 2020-05-22 ENCOUNTER — Ambulatory Visit: Payer: Medicare PPO

## 2020-05-23 ENCOUNTER — Ambulatory Visit (INDEPENDENT_AMBULATORY_CARE_PROVIDER_SITE_OTHER): Payer: Medicare PPO

## 2020-05-23 ENCOUNTER — Other Ambulatory Visit: Payer: Self-pay

## 2020-05-23 DIAGNOSIS — M810 Age-related osteoporosis without current pathological fracture: Secondary | ICD-10-CM | POA: Diagnosis not present

## 2020-05-23 MED ORDER — DENOSUMAB 60 MG/ML ~~LOC~~ SOSY
60.0000 mg | PREFILLED_SYRINGE | Freq: Once | SUBCUTANEOUS | Status: AC
  Administered 2020-05-23: 60 mg via SUBCUTANEOUS

## 2020-05-23 NOTE — Progress Notes (Signed)
I reviewed and agree with the documentation and plan as outlined below.   

## 2020-05-23 NOTE — Progress Notes (Signed)
Pt came in for her Prolia injection, pt got this in her left arm subcutaneously, pt tolerated injection good. Pt scheduled for 6 month injection 11/30/19.

## 2020-05-26 ENCOUNTER — Other Ambulatory Visit: Payer: Self-pay | Admitting: Family Medicine

## 2020-05-30 ENCOUNTER — Other Ambulatory Visit: Payer: Self-pay

## 2020-05-30 ENCOUNTER — Telehealth: Payer: Self-pay

## 2020-05-30 ENCOUNTER — Other Ambulatory Visit: Payer: Self-pay | Admitting: Family Medicine

## 2020-05-30 ENCOUNTER — Other Ambulatory Visit: Payer: Self-pay | Admitting: Nurse Practitioner

## 2020-05-30 MED ORDER — POTASSIUM CHLORIDE ER 10 MEQ PO TBCR: 10.0000 meq | EXTENDED_RELEASE_TABLET | Freq: Two times a day (BID) | ORAL | 1 refills | Status: DC

## 2020-05-30 MED ORDER — BELSOMRA 20 MG PO TABS: 1.0000 | ORAL_TABLET | Freq: Every day | ORAL | 2 refills | Status: DC

## 2020-05-30 MED ORDER — BUPROPION HCL ER (XL) 150 MG PO TB24: 150.0000 mg | ORAL_TABLET | Freq: Every day | ORAL | 1 refills | Status: DC

## 2020-05-30 MED ORDER — BUMETANIDE 1 MG PO TABS: 1.0000 mg | ORAL_TABLET | Freq: Two times a day (BID) | ORAL | 2 refills | Status: DC

## 2020-05-30 MED ORDER — NEXIUM 40 MG PO CPDR: 40.0000 mg | DELAYED_RELEASE_CAPSULE | Freq: Every day | ORAL | 1 refills | Status: DC

## 2020-05-30 NOTE — Telephone Encounter (Signed)
Last office visit- 04/25/2020

## 2020-05-30 NOTE — Telephone Encounter (Signed)
Both rx refilled. She will need q60mo f/u appts, annual urine drug screen, and signed controlled substance agreement for the belsomra since it is a controlled substance

## 2020-05-31 ENCOUNTER — Telehealth: Payer: Self-pay | Admitting: Family Medicine

## 2020-05-31 NOTE — Telephone Encounter (Signed)
Pt scheduled for September

## 2020-05-31 NOTE — Telephone Encounter (Signed)
Patient is calling and requesting a refill for Bumex sent to [place] on [facility], please advise. CB is [phone].

## 2020-05-31 NOTE — Telephone Encounter (Signed)
Pt informed that medication has been sent.  Pt is also scheduled for a follow up visit in September.

## 2020-06-04 ENCOUNTER — Other Ambulatory Visit: Payer: Self-pay | Admitting: Cardiology

## 2020-06-04 NOTE — Telephone Encounter (Signed)
*  STAT* If patient is at the pharmacy, call can be transferred to refill team.   1. Which medications need to be refilled? (please list name of each medication and dose if known) metoprolol succinate (TOPROL-XL) 50 MG 24 hr tablet amLODipine (NORVASC) 5 MG tablet 2. Which pharmacy/location (including street and city if local pharmacy) is medication to be sent to? [place] [place] [facility] [place]  3. Do they need a 30 day or 90 day supply? 90 day supply

## 2020-06-13 ENCOUNTER — Telehealth: Payer: Self-pay | Admitting: Family Medicine

## 2020-06-13 NOTE — Telephone Encounter (Signed)
Left message for patient to schedule Annual Wellness Visit.  Please schedule with Nurse Health Advisor Martha Stanley, RN at [place] [facility]  °

## 2020-06-20 ENCOUNTER — Telehealth: Payer: Self-pay | Admitting: Cardiology

## 2020-06-20 NOTE — Telephone Encounter (Signed)
Spoke with pharmacist and advised that our records indicate patient should be on metoprolol succinate 50mg  daily. This was a dose increase during 03/26/20 visit. This is also the active med/dose in our records.

## 2020-06-20 NOTE — Progress Notes (Signed)
Subjective:   Patricia Peck is a 74 y.o. female who presents for Medicare Annual (Subsequent) preventive examination.  I connected with Patricia Peck today by telephone and verified that I am speaking with the correct person using two identifiers. Location patient: home Location provider: work Persons participating in the virtual visit: patient, Marine scientist.    I discussed the limitations, risks, security and privacy concerns of performing an evaluation and management service by telephone and the availability of in person appointments. I also discussed with the patient that there may be a patient responsible charge related to this service. The patient expressed understanding and verbally consented to this telephonic visit.    Interactive audio and video telecommunications were attempted between this provider and patient, however failed, due to patient having technical difficulties OR patient did not have access to video capability.  We continued and completed visit with audio only.  Some vital signs may be absent or patient reported.   Time Spent with patient on telephone encounter: 25 minutes  Review of Systems     Cardiac Risk Factors include: advanced age (>35men, >82 women);hypertension;sedentary lifestyle;dyslipidemia     Objective:    Today's Vitals   06/21/20 1010  Weight: 131 lb (59.4 kg)  Height: 5\' 4"  (1.626 m)  PainSc: 2    Body mass index is 22.49 kg/m.  Advanced Directives 06/21/2020 05/18/2019 04/21/2018 04/21/2017 01/14/2016 01/08/2016 12/22/2012  Does Patient Have a Medical Advance Directive? Yes Yes Yes Yes Yes Yes Patient has advance directive, copy not in chart  Type of Advance Directive [place];Living will [place];Living will [place];Living will [place];Living will [place];Living will [place];Living will [place];Living will  Does  patient want to make changes to medical advance directive? - No - Patient declined - - No - Patient declined - -  Copy of [place] in Chart? No - copy requested No - copy requested No - copy requested No - copy requested No - copy requested - -  Pre-existing out of facility DNR order (yellow form or pink MOST form) - - - - - - No    Current Medications (verified) Outpatient Encounter Medications as of 06/21/2020  Medication Sig  . ADVAIR DISKUS 100-50 MCG/DOSE AEPB Inhale 1 puff into the lungs in the morning and at bedtime. Maintain upcoming appt for additional refill  . Alpha-D-Galactosidase (BEANO PO) Take by mouth 2 (two) times daily.  Marland Kitchen amLODipine (NORVASC) 5 MG tablet Take 1 tablet (5 mg total) by mouth daily.  Marland Kitchen atorvastatin (LIPITOR) 40 MG tablet Take 1 tablet (40 mg total) by mouth daily.  . Bepotastine Besilate (BEPREVE) 1.5 % SOLN Use one drop in each eye twice a day  . bumetanide (BUMEX) 1 MG tablet Take 1 tablet (1 mg total) by mouth 2 (two) times daily.  Marland Kitchen buPROPion (WELLBUTRIN XL) 150 MG 24 hr tablet Take 1 tablet (150 mg total) by mouth daily.  . Calcium Carbonate-Vitamin D (CALCIUM 600/VITAMIN D) 600-400 MG-UNIT per tablet Take 1 tablet by mouth 2 (two) times daily.    . cephALEXin (KEFLEX) 500 MG capsule Take by mouth.  . Cholecalciferol (VITAMIN D-3) 5000 UNITS TABS Take one tablet by mouth three times a week  . clonazePAM (KLONOPIN) 0.5 MG tablet TAKE 1 TABLET BY MOUTH AT BEDTIME AS NEEDED FOR ANXIETY  . denosumab (PROLIA) 60 MG/ML SOLN injection Inject 60 mg into the skin once.  Administer in upper arm, thigh, or abdomen  . diclofenac sodium (VOLTAREN) 1 % GEL Apply 2g to affected area qid prn  . famotidine (PEPCID AC) 10 MG chewable tablet Chew 1 tablet (10 mg total) by mouth daily.  Marland Kitchen ipratropium (ATROVENT) 0.06 % nasal spray   . levocetirizine (XYZAL) 5 MG tablet Take 5 mg by mouth daily.  Marland Kitchen levothyroxine (SYNTHROID) 50 MCG tablet Take 1 tablet (50 mcg  total) by mouth daily. Maintain upcoming appt for additional refill  . metoprolol succinate (TOPROL-XL) 50 MG 24 hr tablet Take 1 tablet (50 mg total) by mouth daily. Maintain upcoming appt for additional refill  . NEXIUM 40 MG capsule Take 1 capsule (40 mg total) by mouth daily.  Marland Kitchen nystatin (MYCOSTATIN) 100000 UNIT/ML suspension SWISH BY MOUTH 10 ML UP TO 4 TIMES A DAY  . potassium chloride (KLOR-CON) 10 MEQ tablet Take 1 tablet (10 mEq total) by mouth 2 (two) times daily.  . Rivaroxaban (XARELTO) 15 MG TABS tablet Take 1 tablet (15 mg total) by mouth daily with supper.  Marland Kitchen rOPINIRole (REQUIP) 0.25 MG tablet TAKE 1 TO 2 TABLETS BY MOUTH AT BEDTIME  . Suvorexant (BELSOMRA) 20 MG TABS Take 1 tablet by mouth at bedtime.   No facility-administered encounter medications on file as of 06/21/2020.    Allergies (verified) Patient has no known allergies.   History: Past Medical History:  Diagnosis Date  . Allergy   . Arthritis    hands  . Asthma   . Atrial fibrillation ([place])   . Atrial flutter ([place])   . Depression   . Dysrhythmia    a-fib, on Xarelto  . GERD (gastroesophageal reflux disease)   . Hyperlipidemia   . Hypertension   . Hypothyroidism   . Osteoporosis   . TIA (transient ischemic attack)    Past Surgical History:  Procedure Laterality Date  . ABDOMINAL HYSTERECTOMY    . COLONOSCOPY    . MYRINGOTOMY WITH TUBE PLACEMENT Right 01/14/2016   Procedure: RIGHT MYRINGOTOMY WITH T-TUBE PLACEMENT;  Surgeon: Leta Baptist, MD;  Location: [place];  Service: ENT;  Laterality: Right;  . PARTIAL HYSTERECTOMY  1989  . TEE WITHOUT CARDIOVERSION N/A 12/22/2012   Procedure: TRANSESOPHAGEAL ECHOCARDIOGRAM (TEE);  Surgeon: Lelon Perla, MD;  Location: [facility] ENDOSCOPY;  Service: Cardiovascular;  Laterality: N/A;   Family History  Problem Relation Age of Onset  . Breast cancer Mother   . Drug abuse Son   . Depression Son   . Stroke Paternal Aunt   . Heart disease Paternal  Grandfather   . Diabetes Maternal Grandfather    Social History   Socioeconomic History  . Marital status: Married    Spouse name: Not on file  . Number of children: 2  . Years of education: Not on file  . Highest education level: Not on file  Occupational History    Employer: RETIRED  Tobacco Use  . Smoking status: Never Smoker  . Smokeless tobacco: Never Used  Vaping Use  . Vaping Use: Never used  Substance and Sexual Activity  . Alcohol use: Yes    Comment: Rare  . Drug use: No  . Sexual activity: Yes  Other Topics Concern  . Not on file  Social History Narrative   Does have HPOA- sons Eddie Dibbles and Wille Glaser.   Does desire life support if not futile.   Not sure about feeding tubes.   Social Determinants of Health   Financial Resource Strain: Low Risk   .  Difficulty of Paying Living Expenses: Not hard at all  Food Insecurity: No Food Insecurity  . Worried About Charity fundraiser in the Last Year: Never true  . Ran Out of Food in the Last Year: Never true  Transportation Needs: No Transportation Needs  . Lack of Transportation (Medical): No  . Lack of Transportation (Non-Medical): No  Physical Activity: Inactive  . Days of Exercise per Week: 0 days  . Minutes of Exercise per Session: 0 min  Stress: No Stress Concern Present  . Feeling of Stress : Not at all  Social Connections: Moderately Isolated  . Frequency of Communication with Friends and Family: More than three times a week  . Frequency of Social Gatherings with Friends and Family: Once a week  . Attends Religious Services: Never  . Active Member of Clubs or Organizations: No  . Attends Archivist Meetings: Never  . Marital Status: Married    Tobacco Counseling Counseling given: Not Answered   Clinical Intake:  Pre-visit preparation completed: Yes  Pain : 0-10 Pain Score: 2  Pain Type: Acute pain Pain Location: Genitalia Pain Descriptors / Indicators: Burning     Nutritional Status: BMI  of 19-24  Normal Nutritional Risks: None Diabetes: No  How often do you need to have someone help you when you read instructions, pamphlets, or other written materials from your doctor or pharmacy?: 1 - Never What is the last grade level you completed in school?: Some college  Diabetic?No  Interpreter Needed?: No  Information entered by :: Caroleen Hamman LPN   Activities of Daily Living In your present state of health, do you have any difficulty performing the following activities: 06/21/2020  Hearing? N  Vision? N  Difficulty concentrating or making decisions? N  Walking or climbing stairs? N  Dressing or bathing? N  Doing errands, shopping? N  Preparing Food and eating ? N  Using the Toilet? N  In the past six months, have you accidently leaked urine? N  Do you have problems with loss of bowel control? N  Managing your Medications? N  Managing your Finances? N  Housekeeping or managing your Housekeeping? N  Some recent data might be hidden    Patient Care Team: Ronnald Nian, DO as PCP - General (Family Medicine) Stanford Breed, Denice Bors, MD as PCP - Cardiology (Cardiology) Stanford Breed Denice Bors, MD as Consulting Physician (Cardiology) Leta Baptist, MD as Consulting Physician (Otolaryngology) Mosetta Anis, MD as Referring Physician (Allergy)  Indicate any recent Medical Services you may have received from other than [facility] providers in the past year (date may be approximate).     Assessment:   This is a routine wellness examination for Patricia Peck.  Hearing/Vision screen  Hearing Screening   125Hz  250Hz  500Hz  1000Hz  2000Hz  3000Hz  4000Hz  6000Hz  8000Hz   Right ear:           Left ear:           Comments: No issues  Vision Screening Comments: Wears glasses Last eye exam-05/2019-Dr. Gwyndolyn Kaufman  Dietary issues and exercise activities discussed: Current Exercise Habits: The patient does not participate in regular exercise at present, Exercise limited by: None identified  Goals    .  Increase physical activity      Depression Screen PHQ 2/9 Scores 06/21/2020 03/14/2020 11/21/2019 07/06/2019 05/23/2019 05/18/2019 04/26/2018  PHQ - 2 Score 0 0 - 0 0 0 0  PHQ- 9 Score - 2 - 2 - - 1  Exception Documentation - - Other- indicate  reason in comment box - Other- indicate reason in comment box - -  Not completed - - acute - PHQ-2 negative/thx dmf - -    Fall Risk Fall Risk  06/21/2020 05/23/2019 05/18/2019 04/21/2018 08/05/2017  Falls in the past year? 0 0 0 No No  Number falls in past yr: 0 - - - -  Injury with Fall? 0 - - - -  Follow up Falls prevention discussed Falls evaluation completed - - -    Any stairs in or around the home? Yes  If so, are there any without handrails? No  Home free of loose throw rugs in walkways, pet beds, electrical cords, etc? Yes  Adequate lighting in your home to reduce risk of falls? Yes   ASSISTIVE DEVICES UTILIZED TO PREVENT FALLS:  Life alert? No  Use of a cane, walker or w/c? No  Grab bars in the bathroom? Yes  Shower chair or bench in shower? No  Elevated toilet seat or a handicapped toilet? No   TIMED UP AND GO:  Was the test performed? No . Phone visit   Cognitive Function: No cognitive impairment noted. Patient states she plays games on her computer for brain health. MMSE - Mini Mental State Exam 04/21/2018 04/21/2017  Orientation to time 5 5  Orientation to Place 5 5  Registration 3 3  Attention/ Calculation 5 0  Recall 3 1  Recall-comments - pt was unable to recall 2 of 3 words  Language- name 2 objects 2 0  Language- repeat 1 1  Language- follow 3 step command 3 3  Language- read & follow direction 1 0  Write a sentence 1 0  Copy design 1 0  Total score 30 18        Immunizations Immunization History  Administered Date(s) Administered  . Fluad Quad(high Dose 65+) 06/30/2019  . Influenza Split 09/04/2011, 08/11/2012, 08/04/2017  . Influenza Whole 08/07/2009  . Influenza, High Dose Seasonal PF 08/28/2014, 06/29/2019    . Influenza,inj,Quad PF,6+ Mos 08/24/2013, 07/20/2015, 07/29/2016, 08/04/2017  . Influenza-Unspecified 07/10/2018  . PFIZER SARS-COV-2 Vaccination 11/23/2019, 01/04/2020  . Pneumococcal Conjugate-13 04/12/2014  . Pneumococcal Polysaccharide-23 02/05/2006, 04/08/2012  . Td 01/29/2005, 04/17/2015  . Zoster 04/16/2007  . Zoster Recombinat (Shingrix) 02/09/2018    TDAP status: Up to date   Flu Vaccine status: Up to date Due 07/2020  Pneumococcal vaccine status: Up to date   Covid-19 vaccine status: Completed vaccines  Qualifies for Shingles Vaccine? No   Zostavax completed Yes   Shingrix Completed?: Yes  Screening Tests Health Maintenance  Topic Date Due  . INFLUENZA VACCINE  06/03/2020  . MAMMOGRAM  04/30/2021  . COLONOSCOPY  07/20/2024  . TETANUS/TDAP  04/16/2025  . DEXA SCAN  Completed  . COVID-19 Vaccine  Completed  . Hepatitis C Screening  Completed  . PNA vac Low Risk Adult  Completed    Health Maintenance  Health Maintenance Due  Topic Date Due  . INFLUENZA VACCINE  06/03/2020    Colorectal cancer screening: No longer required.    Mammogram status: Completed 04/10/2020. Repeat every year   Bone Density status: Completed 04/04/2020. Results reflect: Bone density results: OSTEOPOROSIS. Repeat every 2 years.  Lung Cancer Screening: (Low Dose CT Chest recommended if Age 42-80 years, 30 pack-year currently smoking OR have quit w/in 15years.) does not qualify.    Additional Screening:  Hepatitis C Screening:Completed 07/20/2015  Vision Screening: Recommended annual ophthalmology exams for early detection of glaucoma and other disorders of the eye.  Is the patient up to date with their annual eye exam?  Yes  Who is the provider or what is the name of the office in which the patient attends annual eye exams? Dr. Gwyndolyn Kaufman   Dental Screening: Recommended annual dental exams for proper oral hygiene  Community Resource Referral / Chronic Care Management: CRR required this  visit?  No   CCM required this visit?  No      Plan:     I have personally reviewed and noted the following in the patient's chart:   . Medical and social history . Use of alcohol, tobacco or illicit drugs  . Current medications and supplements . Functional ability and status . Nutritional status . Physical activity . Advanced directives . List of other physicians . Hospitalizations, surgeries, and ER visits in previous 12 months . Vitals . Screenings to include cognitive, depression, and falls . Referrals and appointments  In addition, I have reviewed and discussed with patient certain preventive protocols, quality metrics, and best practice recommendations. A written personalized care plan for preventive services as well as general preventive health recommendations were provided to patient.    Due to this being a telephonic visit, the after visit summary with patients personalized plan was offered to patient via mail or my-chart. Patient would like to access on my-chart.   Marta Antu, LPN   02/24/5360  Nurse Health Advisor  Nurse Notes: Patient states she was seen recently at another office for a UTI. She is taking antibiotics & has 4 or 5 pills left. She states she is still having a little bit of burning with urination. Patient advised to completed all antibiotics & call back to make an appt if she is still having symptoms once she completes antibiotic. Patient voiced understanding.

## 2020-06-20 NOTE — Telephone Encounter (Signed)
    [name] medication issue:  1. Name of Medication:   metoprolol succinate (TOPROL-XL) 50 MG 24 hr tablet     2. How are you currently taking this medication (dosage and times per day)? Take 1 tablet (50 mg total) by mouth daily. Maintain upcoming appt for additional refill  3. Are you having a reaction (difficulty breathing--STAT)?   4. What is your medication issue? Amy from [place] [facility] calling to clarify this medication, she said pt brought her AVS and it says take metoprolol 25 mg 3x a day total of 75 mg daily.

## 2020-06-21 ENCOUNTER — Ambulatory Visit (INDEPENDENT_AMBULATORY_CARE_PROVIDER_SITE_OTHER): Payer: Medicare PPO

## 2020-06-21 VITALS — Ht 64.0 in | Wt 131.0 lb

## 2020-06-21 DIAGNOSIS — Z0000 Encounter for general adult medical examination without abnormal findings: Secondary | ICD-10-CM

## 2020-06-21 NOTE — Patient Instructions (Signed)
Patricia Peck , Thank you for taking time to complete your Medicare Wellness Visit. I appreciate your ongoing commitment to your health goals. Please review the following plan we discussed and let me know if I can assist you in the future.   Screening recommendations/referrals: Colonoscopy: Completed 07/20/2014- No longer indicated after age 75 Mammogram: Completed 04/30/2020- Due- 04/30/2021 Bone Density: Completed 04/04/2020- Due 04/04/2022 Recommended yearly ophthalmology/optometry visit for glaucoma screening and checkup Recommended yearly dental visit for hygiene and checkup  Vaccinations: Influenza vaccine: Due -07/2020 Pneumococcal vaccine: Completed vaccines Tdap vaccine: Up to Date-Due-04/16/2025 Shingles vaccine: Completed vaccines  Covid-19:Completed vaccines  Advanced directives: Please bring a copy to your next office visit.  Conditions/risks identified: See problem list.  Next appointment: Follow up in one year for your annual wellness visit.    Preventive Care 7 Years and Older, Female Preventive care refers to lifestyle choices and visits with your health care provider that can promote health and wellness. What does preventive care include?  A yearly physical exam. This is also called an annual well check.  Dental exams once or twice a year.  Routine eye exams. Ask your health care provider how often you should have your eyes checked.  Personal lifestyle choices, including:  Daily care of your teeth and gums.  Regular physical activity.  Eating a healthy diet.  Avoiding tobacco and drug use.  Limiting alcohol use.  Practicing safe sex.  Taking low-dose aspirin every day.  Taking vitamin and mineral supplements as recommended by your health care provider. What happens during an annual well check? The services and screenings done by your health care provider during your annual well check will depend on your age, overall health, lifestyle risk factors, and family  history of disease. Counseling  Your health care provider may ask you questions about your:  Alcohol use.  Tobacco use.  Drug use.  Emotional well-being.  Home and relationship well-being.  Sexual activity.  Eating habits.  History of falls.  Memory and ability to understand (cognition).  Work and work Statistician.  Reproductive health. Screening  You may have the following tests or measurements:  Height, weight, and BMI.  Blood pressure.  Lipid and cholesterol levels. These may be checked every 5 years, or more frequently if you are over 69 years old.  Skin check.  Lung cancer screening. You may have this screening every year starting at age 40 if you have a 30-pack-year history of smoking and currently smoke or have quit within the past 15 years.  Fecal occult blood test (FOBT) of the stool. You may have this test every year starting at age 18.  Flexible sigmoidoscopy or colonoscopy. You may have a sigmoidoscopy every 5 years or a colonoscopy every 10 years starting at age 30.  Hepatitis C blood test.  Hepatitis B blood test.  Sexually transmitted disease (STD) testing.  Diabetes screening. This is done by checking your blood sugar (glucose) after you have not eaten for a while (fasting). You may have this done every 1-3 years.  Bone density scan. This is done to screen for osteoporosis. You may have this done starting at age 3.  Mammogram. This may be done every 1-2 years. Talk to your health care provider about how often you should have regular mammograms. Talk with your health care provider about your test results, treatment options, and if necessary, the need for more tests. Vaccines  Your health care provider may recommend certain vaccines, such as:  Influenza vaccine. This is  recommended every year.  Tetanus, diphtheria, and acellular pertussis (Tdap, Td) vaccine. You may need a Td booster every 10 years.  Zoster vaccine. You may need this after  age 37.  Pneumococcal 13-valent conjugate (PCV13) vaccine. One dose is recommended after age 9.  Pneumococcal polysaccharide (PPSV23) vaccine. One dose is recommended after age 85. Talk to your health care provider about which screenings and vaccines you need and how often you need them. This information is not intended to replace advice given to you by your health care provider. Make sure you discuss any questions you have with your health care provider. Document Released: 11/16/2015 Document Revised: 07/09/2016 Document Reviewed: 08/21/2015 Elsevier Interactive Patient Education  2017 [place] Prevention in the Home Falls can cause injuries. They can happen to people of all ages. There are many things you can do to make your home safe and to help prevent falls. What can I do on the outside of my home?  Regularly fix the edges of walkways and driveways and fix any cracks.  Remove anything that might make you trip as you walk through a door, such as a raised step or threshold.  Trim any bushes or trees on the path to your home.  Use bright outdoor lighting.  Clear any walking paths of anything that might make someone trip, such as rocks or tools.  Regularly check to see if handrails are loose or broken. Make sure that both sides of any steps have handrails.  Any raised decks and porches should have guardrails on the edges.  Have any leaves, snow, or ice cleared regularly.  Use sand or salt on walking paths during winter.  Clean up any spills in your garage right away. This includes oil or grease spills. What can I do in the bathroom?  Use night lights.  Install grab bars by the toilet and in the tub and shower. Do not use towel bars as grab bars.  Use non-skid mats or decals in the tub or shower.  If you need to sit down in the shower, use a plastic, non-slip stool.  Keep the floor dry. Clean up any water that spills on the floor as soon as it  happens.  Remove soap buildup in the tub or shower regularly.  Attach bath mats securely with double-sided non-slip rug tape.  Do not have throw rugs and other things on the floor that can make you trip. What can I do in the bedroom?  Use night lights.  Make sure that you have a light by your bed that is easy to reach.  Do not use any sheets or blankets that are too big for your bed. They should not hang down onto the floor.  Have a firm chair that has side arms. You can use this for support while you get dressed.  Do not have throw rugs and other things on the floor that can make you trip. What can I do in the kitchen?  Clean up any spills right away.  Avoid walking on wet floors.  Keep items that you use a lot in easy-to-reach places.  If you need to reach something above you, use a strong step stool that has a grab bar.  Keep electrical cords out of the way.  Do not use floor polish or wax that makes floors slippery. If you must use wax, use non-skid floor wax.  Do not have throw rugs and other things on the floor that can make you trip.  What can I do with my stairs?  Do not leave any items on the stairs.  Make sure that there are handrails on both sides of the stairs and use them. Fix handrails that are broken or loose. Make sure that handrails are as long as the stairways.  Check any carpeting to make sure that it is firmly attached to the stairs. Fix any carpet that is loose or worn.  Avoid having throw rugs at the top or bottom of the stairs. If you do have throw rugs, attach them to the floor with carpet tape.  Make sure that you have a light switch at the top of the stairs and the bottom of the stairs. If you do not have them, ask someone to add them for you. What else can I do to help prevent falls?  Wear shoes that:  Do not have high heels.  Have rubber bottoms.  Are comfortable and fit you well.  Are closed at the toe. Do not wear sandals.  If you  use a stepladder:  Make sure that it is fully opened. Do not climb a closed stepladder.  Make sure that both sides of the stepladder are locked into place.  Ask someone to hold it for you, if possible.  Clearly mark and make sure that you can see:  Any grab bars or handrails.  First and last steps.  Where the edge of each step is.  Use tools that help you move around (mobility aids) if they are needed. These include:  Canes.  Walkers.  Scooters.  Crutches.  Turn on the lights when you go into a dark area. Replace any light bulbs as soon as they burn out.  Set up your furniture so you have a clear path. Avoid moving your furniture around.  If any of your floors are uneven, fix them.  If there are any pets around you, be aware of where they are.  Review your medicines with your doctor. Some medicines can make you feel dizzy. This can increase your chance of falling. Ask your doctor what other things that you can do to help prevent falls. This information is not intended to replace advice given to you by your health care provider. Make sure you discuss any questions you have with your health care provider. Document Released: 08/16/2009 Document Revised: 03/27/2016 Document Reviewed: 11/24/2014 Elsevier Interactive Patient Education  2017 [employer].

## 2020-06-25 ENCOUNTER — Telehealth: Payer: Self-pay | Admitting: Family Medicine

## 2020-06-25 NOTE — Telephone Encounter (Signed)
Patient has been to urgent care the last two weekends due to urinary burning. She is currently on antibiotics since Saturday. Urgent care gave her 5 days of antibiotics and told her to contact her PCP if still having symptoms. Patient states today she is still having burning. She is calling for advice. Please advise.

## 2020-06-25 NOTE — Telephone Encounter (Signed)
Spoke with patient and she states she is still having burning but has only been on the medication for 2 days and wanted to confirm that she needs to finish the medication and call back if there is no changes after she completes the medication. I informed the patient if the symptoms still are not gone after completely the medication give [place] a call back. Patient states she is having xrays tomorrow due to left leg swelling. Patient states she just wanted to make [place] aware of all the things that were going on and she will call us back to keep [place] updated.

## 2020-06-29 ENCOUNTER — Other Ambulatory Visit: Payer: Self-pay

## 2020-06-29 DIAGNOSIS — E7800 Pure hypercholesterolemia, unspecified: Secondary | ICD-10-CM

## 2020-06-29 DIAGNOSIS — I480 Paroxysmal atrial fibrillation: Secondary | ICD-10-CM

## 2020-06-29 MED ORDER — AMLODIPINE BESYLATE 5 MG PO TABS: 5.0000 mg | ORAL_TABLET | Freq: Every day | ORAL | 2 refills | Status: DC

## 2020-06-29 MED ORDER — ATORVASTATIN CALCIUM 40 MG PO TABS: 40.0000 mg | ORAL_TABLET | Freq: Every day | ORAL | 2 refills | Status: DC

## 2020-06-29 MED ORDER — METOPROLOL SUCCINATE ER 50 MG PO TB24: 50.0000 mg | ORAL_TABLET | Freq: Every day | ORAL | 2 refills | Status: DC

## 2020-07-02 ENCOUNTER — Telehealth: Payer: Self-pay

## 2020-07-02 NOTE — Telephone Encounter (Signed)
Dr. Loletha Grayer please advise.  We received refill requests from [facility] on all medications from you. I tried to touch base with patient to see if she needs any medications now but it looks like shes okay. I left her a message an sent a my chart message. But it seems she is switching pharmacies to [place].

## 2020-07-04 NOTE — Telephone Encounter (Signed)
Preferred pharm update to [facility]

## 2020-07-05 MED ORDER — IPRATROPIUM BROMIDE 0.06 % NA SOLN: 2.0000 | Freq: Three times a day (TID) | NASAL | 1 refills | Status: DC

## 2020-07-05 MED ORDER — ROPINIROLE HCL 0.25 MG PO TABS: ORAL_TABLET | ORAL | 0 refills | Status: DC

## 2020-07-05 MED ORDER — LEVOCETIRIZINE DIHYDROCHLORIDE 5 MG PO TABS
5.0000 mg | ORAL_TABLET | Freq: Every day | ORAL | 1 refills | Status: DC
Start: 2020-07-05 — End: 2020-07-17

## 2020-07-05 MED ORDER — BELSOMRA 20 MG PO TABS
1.0000 | ORAL_TABLET | Freq: Every day | ORAL | 2 refills | Status: DC
Start: 2020-07-05 — End: 2020-07-17

## 2020-07-05 MED ORDER — BUMETANIDE 1 MG PO TABS: 1.0000 mg | ORAL_TABLET | Freq: Two times a day (BID) | ORAL | 2 refills | Status: DC

## 2020-07-05 MED ORDER — LEVOTHYROXINE SODIUM 50 MCG PO TABS: 50.0000 ug | ORAL_TABLET | Freq: Every day | ORAL | 0 refills | Status: DC

## 2020-07-05 MED ORDER — BUPROPION HCL ER (XL) 150 MG PO TB24: 150.0000 mg | ORAL_TABLET | Freq: Every day | ORAL | 1 refills | Status: DC

## 2020-07-05 MED ORDER — POTASSIUM CHLORIDE ER 10 MEQ PO TBCR: 10.0000 meq | EXTENDED_RELEASE_TABLET | Freq: Two times a day (BID) | ORAL | 1 refills | Status: DC

## 2020-07-05 MED ORDER — NEXIUM 40 MG PO CPDR: 40.0000 mg | DELAYED_RELEASE_CAPSULE | Freq: Every day | ORAL | 1 refills | Status: DC

## 2020-07-05 NOTE — Addendum Note (Signed)
Addended by: Ronnald Nian on: 07/05/2020 08:47 AM   Modules accepted: Orders

## 2020-07-05 NOTE — Telephone Encounter (Signed)
Rx sent 

## 2020-07-13 ENCOUNTER — Ambulatory Visit: Payer: Medicare PPO | Admitting: Family Medicine

## 2020-07-16 ENCOUNTER — Other Ambulatory Visit: Payer: Self-pay

## 2020-07-17 ENCOUNTER — Other Ambulatory Visit: Payer: Self-pay

## 2020-07-17 ENCOUNTER — Ambulatory Visit: Payer: Medicare PPO | Admitting: Family Medicine

## 2020-07-17 MED ORDER — ROPINIROLE HCL 0.25 MG PO TABS: ORAL_TABLET | ORAL | 1 refills | Status: DC

## 2020-07-17 MED ORDER — LEVOCETIRIZINE DIHYDROCHLORIDE 5 MG PO TABS
5.0000 mg | ORAL_TABLET | Freq: Every day | ORAL | 1 refills | Status: DC
Start: 2020-07-17 — End: 2021-01-03

## 2020-07-17 MED ORDER — BELSOMRA 20 MG PO TABS
1.0000 | ORAL_TABLET | Freq: Every day | ORAL | 1 refills | Status: DC
Start: 2020-07-17 — End: 2021-02-05

## 2020-07-17 NOTE — Telephone Encounter (Signed)
Patient notified VIA phone that RX's were sent to the pharmacy.  Dm/cma

## 2020-07-17 NOTE — Telephone Encounter (Signed)
Received a refill request for :  Ropinirole 0.25 mg tabs Levocetrizine 5 mg tabs  LR 07/05/20 but only sent with 30 days instead of 90 days. [employer] faxed a form asking for 90 day supply with refill.  Rx resent to the pharmacy. Dm/cma

## 2020-07-17 NOTE — Telephone Encounter (Signed)
Received a request from [facility] mail order asking for a 90 day(1 refill) supply of  Belsomera 20 mg tabs instead of 30 day with 2 Rf's. LR 07/05/20 LOV 04/25/20  Please advise.  Thanks. Dm/cma

## 2020-07-20 ENCOUNTER — Encounter: Payer: Self-pay | Admitting: Family Medicine

## 2020-07-20 ENCOUNTER — Ambulatory Visit (INDEPENDENT_AMBULATORY_CARE_PROVIDER_SITE_OTHER): Payer: Medicare PPO | Admitting: Family Medicine

## 2020-07-20 ENCOUNTER — Telehealth: Payer: Self-pay

## 2020-07-20 VITALS — BP 118/70 | HR 59 | Temp 97.6°F | Ht 64.0 in | Wt 133.8 lb

## 2020-07-20 DIAGNOSIS — M85851 Other specified disorders of bone density and structure, right thigh: Secondary | ICD-10-CM

## 2020-07-20 DIAGNOSIS — E038 Other specified hypothyroidism: Secondary | ICD-10-CM

## 2020-07-20 DIAGNOSIS — E785 Hyperlipidemia, unspecified: Secondary | ICD-10-CM

## 2020-07-20 DIAGNOSIS — G4709 Other insomnia: Secondary | ICD-10-CM

## 2020-07-20 DIAGNOSIS — M85852 Other specified disorders of bone density and structure, left thigh: Secondary | ICD-10-CM

## 2020-07-20 DIAGNOSIS — R300 Dysuria: Secondary | ICD-10-CM

## 2020-07-20 DIAGNOSIS — I10 Essential (primary) hypertension: Secondary | ICD-10-CM

## 2020-07-20 LAB — HEPATIC FUNCTION PANEL
ALT: 13 U/L (ref 0–35)
AST: 15 U/L (ref 0–37)
Albumin: 4.4 g/dL (ref 3.5–5.2)
Alkaline Phosphatase: 85 U/L (ref 39–117)
Bilirubin, Direct: 0.1 mg/dL (ref 0.0–0.3)
Total Bilirubin: 0.8 mg/dL (ref 0.2–1.2)
Total Protein: 7 g/dL (ref 6.0–8.3)

## 2020-07-20 LAB — LIPID PANEL
Cholesterol: 155 mg/dL (ref 0–200)
HDL: 36.2 mg/dL — ABNORMAL LOW (ref >39.00)
LDL Cholesterol: 81 mg/dL (ref 0–99)
NonHDL: 118.73
Total CHOL/HDL Ratio: 4
Triglycerides: 190 mg/dL — ABNORMAL HIGH (ref 0.0–149.0)
VLDL: 38 mg/dL (ref 0.0–40.0)

## 2020-07-20 LAB — T4, FREE: Free T4: 0.88 ng/dL (ref 0.60–1.60)

## 2020-07-20 LAB — TSH: TSH: 2.44 u[IU]/mL (ref 0.35–4.50)

## 2020-07-20 NOTE — Progress Notes (Addendum)
Patricia Peck is a 74 y.o. female  Chief Complaint  Patient presents with  . Annual Exam    CPE, fasting this am. dysuria off/on x 1 month (went to [facility]) stil having slight symptoms    HPI: Patricia Peck is a 74 y.o. female who is seen today for routine f/u on chronic medical issues including hypothyroidism, HTN, hyperlipidemia, osteopenia, insomnia.  Pt follows with cardio Dr. Stanford Breed and last appt and labs with him were 03/2020 (CBC, CMP, lipid panel) and next appt in 10/2020. Last Dexa in 04/2020 - T-score = -1.7. Last TFTs were in 05/2019 - WNL.  For insomnia, pt takes belsomra with good efficacy.   Pt complains of dysuria on/off x 1 mo. She was seen at a [facility] on 06/16/20 and Rx'd keflex 500mg  QID x 7 days as well as pyridium. She went back in f/u on 06/23/20 and was given cipro BID x 5 days. Pt states she is still having symptoms periodically.    Last colonoscopy - 07/2014 - Dr. Deatra Ina w/ [facility] - could f/u in 10 years but was recommended to discuss with PCP about risk v benefit of screening at that time Last mammo - 04/2020 - normal  Past Medical History:  Diagnosis Date  . Allergy   . Arthritis    hands  . Asthma   . Atrial fibrillation ([place])   . Atrial flutter ([place])   . Depression   . Dysrhythmia    a-fib, on Xarelto  . GERD (gastroesophageal reflux disease)   . Hyperlipidemia   . Hypertension   . Hypothyroidism   . Osteoporosis   . TIA (transient ischemic attack)     Past Surgical History:  Procedure Laterality Date  . ABDOMINAL HYSTERECTOMY    . COLONOSCOPY    . MYRINGOTOMY WITH TUBE PLACEMENT Right 01/14/2016   Procedure: RIGHT MYRINGOTOMY WITH T-TUBE PLACEMENT;  Surgeon: Leta Baptist, MD;  Location: [facility];  Service: ENT;  Laterality: Right;  . PARTIAL HYSTERECTOMY  1989  . TEE WITHOUT CARDIOVERSION N/A 12/22/2012   Procedure: TRANSESOPHAGEAL ECHOCARDIOGRAM (TEE);  Surgeon: Lelon Perla, MD;  Location: [facility] ENDOSCOPY;  Service:  Cardiovascular;  Laterality: N/A;    Social History   Socioeconomic History  . Marital status: Married    Spouse name: Not on file  . Number of children: 2  . Years of education: Not on file  . Highest education level: Not on file  Occupational History    Employer: RETIRED  Tobacco Use  . Smoking status: Never Smoker  . Smokeless tobacco: Never Used  Vaping Use  . Vaping Use: Never used  Substance and Sexual Activity  . Alcohol use: Yes    Comment: Rare  . Drug use: No  . Sexual activity: Yes  Other Topics Concern  . Not on file  Social History Narrative   Does have HPOA- sons Eddie Dibbles and Wille Glaser.   Does desire life support if not futile.   Not sure about feeding tubes.   Social Determinants of Health   Financial Resource Strain: Low Risk   . Difficulty of Paying Living Expenses: Not hard at all  Food Insecurity: No Food Insecurity  . Worried About Charity fundraiser in the Last Year: Never true  . Ran Out of Food in the Last Year: Never true  Transportation Needs: No Transportation Needs  . Lack of Transportation (Medical): No  . Lack of Transportation (Non-Medical): No  Physical Activity: Inactive  .  Days of Exercise per Week: 0 days  . Minutes of Exercise per Session: 0 min  Stress: No Stress Concern Present  . Feeling of Stress : Not at all  Social Connections: Moderately Isolated  . Frequency of Communication with Friends and Family: More than three times a week  . Frequency of Social Gatherings with Friends and Family: Once a week  . Attends Religious Services: Never  . Active Member of Clubs or Organizations: No  . Attends Archivist Meetings: Never  . Marital Status: Married  Human resources officer Violence: Not At Risk  . Fear of Current or Ex-Partner: No  . Emotionally Abused: No  . Physically Abused: No  . Sexually Abused: No    Family History  Problem Relation Age of Onset  . Breast cancer Mother   . Drug abuse Son   . Depression Son   .  Stroke Paternal Aunt   . Heart disease Paternal Grandfather   . Diabetes Maternal Grandfather      Immunization History  Administered Date(s) Administered  . Fluad Quad(high Dose 65+) 06/30/2019  . Influenza Split 09/04/2011, 08/11/2012, 08/04/2017  . Influenza Whole 08/07/2009  . Influenza, High Dose Seasonal PF 08/28/2014, 06/29/2019  . Influenza,inj,Quad PF,6+ Mos 08/24/2013, 07/20/2015, 07/29/2016, 08/04/2017  . Influenza-Unspecified 07/10/2018, 07/14/2020  . PFIZER SARS-COV-2 Vaccination 11/23/2019, 01/04/2020  . Pneumococcal Conjugate-13 04/12/2014  . Pneumococcal Polysaccharide-23 02/05/2006, 04/08/2012  . Td 01/29/2005, 04/17/2015  . Zoster 04/16/2007  . Zoster Recombinat (Shingrix) 02/09/2018    Outpatient Encounter Medications as of 07/20/2020  Medication Sig  . ADVAIR DISKUS 100-50 MCG/DOSE AEPB Inhale 1 puff into the lungs in the morning and at bedtime. Maintain upcoming appt for additional refill  . Alpha-D-Galactosidase (BEANO PO) Take by mouth 2 (two) times daily.  Marland Kitchen amLODipine (NORVASC) 5 MG tablet Take 1 tablet (5 mg total) by mouth daily.  Marland Kitchen atorvastatin (LIPITOR) 40 MG tablet Take 1 tablet (40 mg total) by mouth daily.  . Bepotastine Besilate (BEPREVE) 1.5 % SOLN Use one drop in each eye twice a day  . bumetanide (BUMEX) 1 MG tablet Take 1 tablet (1 mg total) by mouth 2 (two) times daily.  Marland Kitchen buPROPion (WELLBUTRIN XL) 150 MG 24 hr tablet Take 1 tablet (150 mg total) by mouth daily.  . Calcium Carbonate-Vitamin D (CALCIUM 600/VITAMIN D) 600-400 MG-UNIT per tablet Take 1 tablet by mouth 2 (two) times daily.    . Cholecalciferol (VITAMIN D-3) 5000 UNITS TABS Take one tablet by mouth three times a week  . clonazePAM (KLONOPIN) 0.5 MG tablet TAKE 1 TABLET BY MOUTH AT BEDTIME AS NEEDED FOR ANXIETY  . denosumab (PROLIA) 60 MG/ML SOLN injection Inject 60 mg into the skin once. Administer in upper arm, thigh, or abdomen  . diclofenac sodium (VOLTAREN) 1 % GEL Apply 2g to  affected area qid prn  . famotidine (PEPCID AC) 10 MG chewable tablet Chew 1 tablet (10 mg total) by mouth daily.  Marland Kitchen ipratropium (ATROVENT) 0.06 % nasal spray Place 2 sprays into both nostrils 3 (three) times daily.  Marland Kitchen levocetirizine (XYZAL) 5 MG tablet Take 1 tablet (5 mg total) by mouth daily.  Marland Kitchen levothyroxine (SYNTHROID) 50 MCG tablet Take 1 tablet (50 mcg total) by mouth daily. Maintain upcoming appt for additional refill  . metoprolol succinate (TOPROL-XL) 50 MG 24 hr tablet Take 1 tablet (50 mg total) by mouth daily. Maintain upcoming appt for additional refill  . NEXIUM 40 MG capsule Take 1 capsule (40 mg total) by mouth  daily.  . nystatin (MYCOSTATIN) 100000 UNIT/ML suspension SWISH BY MOUTH 10 ML UP TO 4 TIMES A DAY  . potassium chloride (KLOR-CON) 10 MEQ tablet Take 1 tablet (10 mEq total) by mouth 2 (two) times daily.  . Rivaroxaban (XARELTO) 15 MG TABS tablet Take 1 tablet (15 mg total) by mouth daily with supper.  Marland Kitchen rOPINIRole (REQUIP) 0.25 MG tablet TAKE 1 TO 2 TABLETS BY MOUTH AT BEDTIME  . Suvorexant (BELSOMRA) 20 MG TABS Take 1 tablet by mouth at bedtime.   No facility-administered encounter medications on file as of 07/20/2020.     ROS: Pertinent positives and negatives noted in HPI. Remainder of ROS non-contributory    No Known Allergies  BP 118/70   Pulse (!) 59   Temp 97.6 F (36.4 C) (Temporal)   Ht 5\' 4"  (1.626 m)   Wt 133 lb 12.8 oz (60.7 kg)   SpO2 95%   BMI 22.97 kg/m    BP Readings from Last 3 Encounters:  07/20/20 118/70  04/25/20 102/82  04/09/20 94/62   Pulse Readings from Last 3 Encounters:  07/20/20 (!) 59  04/25/20 65  04/09/20 63   Wt Readings from Last 3 Encounters:  07/20/20 133 lb 12.8 oz (60.7 kg)  06/21/20 131 lb (59.4 kg)  04/09/20 131 lb 6.4 oz (59.6 kg)     Physical Exam Constitutional:      General: She is not in acute distress.    Appearance: She is well-developed.  HENT:     Head: Normocephalic and atraumatic.      Right Ear: Tympanic membrane and ear canal normal.     Left Ear: Tympanic membrane and ear canal normal.     Nose: Nose normal.  Eyes:     Conjunctiva/sclera: Conjunctivae normal.     Pupils: Pupils are equal, round, and reactive to light.  Neck:     Thyroid: No thyromegaly.  Cardiovascular:     Rate and Rhythm: Normal rate and regular rhythm.     Pulses: Normal pulses.     Heart sounds: No murmur heard.   Pulmonary:     Effort: Pulmonary effort is normal. No respiratory distress.     Breath sounds: Normal breath sounds. No wheezing or rhonchi.  Abdominal:     General: Bowel sounds are normal. There is no distension.     Palpations: Abdomen is soft. There is no mass.     Tenderness: There is no abdominal tenderness. There is no right CVA tenderness or left CVA tenderness.  Musculoskeletal:     Cervical back: Neck supple.     Right lower leg: No edema.     Left lower leg: No edema.  Lymphadenopathy:     Cervical: No cervical adenopathy.  Skin:    General: Skin is warm and dry.  Neurological:     Mental Status: She is alert and oriented to person, place, and time.     Motor: No abnormal muscle tone.     Coordination: Coordination normal.  Psychiatric:        Behavior: Behavior normal.     A/P:  1. Other insomnia - chronic, controlled - cont belsomra  2. Essential hypertension - controlled, at goal - cont current med regimen and regular f/u with cardio  3. Hyperlipidemia, unspecified hyperlipidemia type - FLP in 03/2020 - on lipitor 40mg  daily and cardio wanted repeat FLP which wil be drawn today  4. Other specified hypothyroidism - levothyroxine 38mcg daily - T4, free - TSH  5. Osteopenia of necks of both femurs - dexa in 04/2020 - cont Vit D supplement  6. Dysuria - Urine Culture    This visit occurred during the SARS-CoV-2 public health emergency.  Safety protocols were in place, including screening questions prior to the visit, additional usage of staff  PPE, and extensive cleaning of exam room while observing appropriate contact time as indicated for disinfecting solutions.

## 2020-07-20 NOTE — Addendum Note (Signed)
Addended by: Lynnea Ferrier on: 07/20/2020 10:18 AM   Modules accepted: Orders

## 2020-07-20 NOTE — Telephone Encounter (Signed)
Patient notified VIA phone. Dm/cma  

## 2020-07-22 LAB — URINE CULTURE
MICRO NUMBER:: 10964756
SPECIMEN QUALITY:: ADEQUATE

## 2020-07-23 ENCOUNTER — Telehealth: Payer: Self-pay | Admitting: *Deleted

## 2020-07-23 DIAGNOSIS — E7800 Pure hypercholesterolemia, unspecified: Secondary | ICD-10-CM

## 2020-07-23 MED ORDER — ATORVASTATIN CALCIUM 80 MG PO TABS: 80.0000 mg | ORAL_TABLET | Freq: Every day | ORAL | 3 refills | Status: DC

## 2020-07-23 NOTE — Telephone Encounter (Addendum)
Spoke with pt, Aware of dr Jacalyn Lefevre recommendations. She will increase to 80 mg daily as she is currently taking 40 mg. New script sent to the pharmacy and Lab orders mailed to the pt   ----- Message from Lelon Perla, MD sent at 07/23/2020  7:02 AM EDT ----- Increase lipitor to 40 mg daily; lipids and liver 12 weeks Kirk [facility]s

## 2020-07-25 ENCOUNTER — Other Ambulatory Visit: Payer: Self-pay | Admitting: Family Medicine

## 2020-07-25 DIAGNOSIS — N3000 Acute cystitis without hematuria: Secondary | ICD-10-CM

## 2020-07-25 MED ORDER — CEPHALEXIN 500 MG PO CAPS: 500.0000 mg | ORAL_CAPSULE | Freq: Two times a day (BID) | ORAL | 0 refills | Status: DC

## 2020-07-26 ENCOUNTER — Other Ambulatory Visit: Payer: Self-pay

## 2020-07-26 DIAGNOSIS — N3000 Acute cystitis without hematuria: Secondary | ICD-10-CM

## 2020-07-26 MED ORDER — CEPHALEXIN 500 MG PO CAPS: 500.0000 mg | ORAL_CAPSULE | Freq: Two times a day (BID) | ORAL | 0 refills | Status: AC

## 2020-08-29 ENCOUNTER — Other Ambulatory Visit: Payer: Self-pay

## 2020-08-29 MED ORDER — LEVOTHYROXINE SODIUM 50 MCG PO TABS: 50.0000 ug | ORAL_TABLET | Freq: Every day | ORAL | 1 refills | Status: DC

## 2020-08-29 NOTE — Telephone Encounter (Signed)
Received a refill for:  Levothyroxine 50 mcg by fax from [place].   LOV 07/20/20  Rx sent to pharmacy. Dm/cma

## 2020-08-31 ENCOUNTER — Other Ambulatory Visit: Payer: Self-pay

## 2020-08-31 ENCOUNTER — Encounter: Payer: Self-pay | Admitting: Family Medicine

## 2020-08-31 ENCOUNTER — Ambulatory Visit (INDEPENDENT_AMBULATORY_CARE_PROVIDER_SITE_OTHER): Payer: Medicare PPO | Admitting: Family Medicine

## 2020-08-31 VITALS — BP 124/80 | HR 55 | Temp 97.3°F | Ht 64.0 in | Wt 132.8 lb

## 2020-08-31 DIAGNOSIS — R3915 Urgency of urination: Secondary | ICD-10-CM | POA: Diagnosis not present

## 2020-08-31 DIAGNOSIS — R399 Unspecified symptoms and signs involving the genitourinary system: Secondary | ICD-10-CM

## 2020-08-31 LAB — POCT URINALYSIS DIPSTICK
Glucose, UA: NEGATIVE
Ketones, UA: NEGATIVE
Nitrite, UA: NEGATIVE
Protein, UA: POSITIVE — AB
Spec Grav, UA: 1.025 (ref 1.010–1.025)
Urobilinogen, UA: 1 E.U./dL
pH, UA: 6 (ref 5.0–8.0)

## 2020-08-31 MED ORDER — AMOXICILLIN-POT CLAVULANATE 875-125 MG PO TABS: 1.0000 | ORAL_TABLET | Freq: Two times a day (BID) | ORAL | 0 refills | Status: DC

## 2020-08-31 NOTE — Progress Notes (Signed)
Patricia Peck is a 74 y.o. female  Chief Complaint  Patient presents with  . Acute Visit    urine urgency/leakage.     HPI: Patricia Peck is a 74 y.o. female complains of increased urinary urgency, dysuria, and 1 episode of urinary incontinence last night. No gross blood. No n/v. No f/c. No abdominal pain. Pt notes "a little back ache" this AM.  She had UTI around 07/20/20 and was treated was keflex which pt completed.  She notes loose stools since last night.  Pt had [place] booster yesterday.   Component     Latest Ref Rng & Units 08/31/2020          Color, UA      pale pink  Clarity, UA      cloudy  Glucose     Negative Negative  Bilirubin, UA      1+  Specific Gravity, UA     1.010 - 1.025 1.025  RBC, UA      3+  pH, UA     5.0 - 8.0 6.0  Protein,UA     Negative Positive (A)  Urobilinogen, UA     0.2 or 1.0 E.U./dL 1.0  Nitrite, UA      negative  Leukocytes,UA     Negative Moderate (2+) (A)  Ketones, UA      negative    Past Medical History:  Diagnosis Date  . Allergy   . Arthritis    hands  . Asthma   . Atrial fibrillation ([place])   . Atrial flutter ([place])   . Depression   . Dysrhythmia    a-fib, on Xarelto  . GERD (gastroesophageal reflux disease)   . Hyperlipidemia   . Hypertension   . Hypothyroidism   . Osteoporosis   . TIA (transient ischemic attack)     Past Surgical History:  Procedure Laterality Date  . ABDOMINAL HYSTERECTOMY    . COLONOSCOPY    . MYRINGOTOMY WITH TUBE PLACEMENT Right 01/14/2016   Procedure: RIGHT MYRINGOTOMY WITH T-TUBE PLACEMENT;  Surgeon: Leta Baptist, MD;  Location: [facility];  Service: ENT;  Laterality: Right;  . PARTIAL HYSTERECTOMY  1989  . TEE WITHOUT CARDIOVERSION N/A 12/22/2012   Procedure: TRANSESOPHAGEAL ECHOCARDIOGRAM (TEE);  Surgeon: Lelon Perla, MD;  Location: [facility] ENDOSCOPY;  Service: Cardiovascular;  Laterality: N/A;    Social History   Socioeconomic History  . Marital status:  Married    Spouse name: Not on file  . Number of children: 2  . Years of education: Not on file  . Highest education level: Not on file  Occupational History    Employer: RETIRED  Tobacco Use  . Smoking status: Never Smoker  . Smokeless tobacco: Never Used  Vaping Use  . Vaping Use: Never used  Substance and Sexual Activity  . Alcohol use: Yes    Comment: Rare  . Drug use: No  . Sexual activity: Yes  Other Topics Concern  . Not on file  Social History Narrative   Does have HPOA- sons Eddie Dibbles and Wille Glaser.   Does desire life support if not futile.   Not sure about feeding tubes.   Social Determinants of Health   Financial Resource Strain: Low Risk   . Difficulty of Paying Living Expenses: Not hard at all  Food Insecurity: No Food Insecurity  . Worried About Charity fundraiser in the Last Year: Never true  . Ran Out of Food in the Last Year: Never  true  Transportation Needs: No Transportation Needs  . Lack of Transportation (Medical): No  . Lack of Transportation (Non-Medical): No  Physical Activity: Inactive  . Days of Exercise per Week: 0 days  . Minutes of Exercise per Session: 0 min  Stress: No Stress Concern Present  . Feeling of Stress : Not at all  Social Connections: Moderately Isolated  . Frequency of Communication with Friends and Family: More than three times a week  . Frequency of Social Gatherings with Friends and Family: Once a week  . Attends Religious Services: Never  . Active Member of Clubs or Organizations: No  . Attends Archivist Meetings: Never  . Marital Status: Married  Human resources officer Violence: Not At Risk  . Fear of Current or Ex-Partner: No  . Emotionally Abused: No  . Physically Abused: No  . Sexually Abused: No    Family History  Problem Relation Age of Onset  . Breast cancer Mother   . Drug abuse Son   . Depression Son   . Stroke Paternal Aunt   . Heart disease Paternal Grandfather   . Diabetes Maternal Grandfather       Immunization History  Administered Date(s) Administered  . Fluad Quad(high Dose 65+) 06/30/2019  . Influenza Split 09/04/2011, 08/11/2012, 08/04/2017  . Influenza Whole 08/07/2009  . Influenza, High Dose Seasonal PF 08/28/2014, 06/29/2019  . Influenza,inj,Quad PF,6+ Mos 08/24/2013, 07/20/2015, 07/29/2016, 08/04/2017  . Influenza-Unspecified 07/10/2018, 07/14/2020  . PFIZER SARS-COV-2 Vaccination 11/23/2019, 01/04/2020  . Pneumococcal Conjugate-13 04/12/2014  . Pneumococcal Polysaccharide-23 02/05/2006, 04/08/2012  . Td 01/29/2005, 04/17/2015  . Zoster 04/16/2007  . Zoster Recombinat (Shingrix) 02/09/2018    Outpatient Encounter Medications as of 08/31/2020  Medication Sig  . ADVAIR DISKUS 100-50 MCG/DOSE AEPB Inhale 1 puff into the lungs in the morning and at bedtime. Maintain upcoming appt for additional refill  . Alpha-D-Galactosidase (BEANO PO) Take by mouth 2 (two) times daily.  Marland Kitchen amLODipine (NORVASC) 5 MG tablet Take 1 tablet (5 mg total) by mouth daily.  Marland Kitchen atorvastatin (LIPITOR) 80 MG tablet Take 1 tablet (80 mg total) by mouth daily.  . Bepotastine Besilate (BEPREVE) 1.5 % SOLN Use one drop in each eye twice a day  . bumetanide (BUMEX) 1 MG tablet Take 1 tablet (1 mg total) by mouth 2 (two) times daily.  Marland Kitchen buPROPion (WELLBUTRIN XL) 150 MG 24 hr tablet Take 1 tablet (150 mg total) by mouth daily.  . Calcium Carbonate-Vitamin D (CALCIUM 600/VITAMIN D) 600-400 MG-UNIT per tablet Take 1 tablet by mouth 2 (two) times daily.    . Cholecalciferol (VITAMIN D-3) 5000 UNITS TABS Take one tablet by mouth three times a week  . clonazePAM (KLONOPIN) 0.5 MG tablet TAKE 1 TABLET BY MOUTH AT BEDTIME AS NEEDED FOR ANXIETY  . denosumab (PROLIA) 60 MG/ML SOLN injection Inject 60 mg into the skin once. Administer in upper arm, thigh, or abdomen  . diclofenac sodium (VOLTAREN) 1 % GEL Apply 2g to affected area qid prn  . famotidine (PEPCID AC) 10 MG chewable tablet Chew 1 tablet (10 mg total) by  mouth daily.  Marland Kitchen ipratropium (ATROVENT) 0.06 % nasal spray Place 2 sprays into both nostrils 3 (three) times daily.  Marland Kitchen levocetirizine (XYZAL) 5 MG tablet Take 1 tablet (5 mg total) by mouth daily.  Marland Kitchen levothyroxine (SYNTHROID) 50 MCG tablet Take 1 tablet (50 mcg total) by mouth daily. Maintain upcoming appt for additional refill  . metoprolol succinate (TOPROL-XL) 50 MG 24 hr tablet Take 1  tablet (50 mg total) by mouth daily. Maintain upcoming appt for additional refill  . NEXIUM 40 MG capsule Take 1 capsule (40 mg total) by mouth daily.  Marland Kitchen nystatin (MYCOSTATIN) 100000 UNIT/ML suspension SWISH BY MOUTH 10 ML UP TO 4 TIMES A DAY  . potassium chloride (KLOR-CON) 10 MEQ tablet Take 1 tablet (10 mEq total) by mouth 2 (two) times daily.  . Rivaroxaban (XARELTO) 15 MG TABS tablet Take 1 tablet (15 mg total) by mouth daily with supper.  Marland Kitchen rOPINIRole (REQUIP) 0.25 MG tablet TAKE 1 TO 2 TABLETS BY MOUTH AT BEDTIME  . Suvorexant (BELSOMRA) 20 MG TABS Take 1 tablet by mouth at bedtime.   No facility-administered encounter medications on file as of 08/31/2020.     ROS: Pertinent positives and negatives noted in HPI. Remainder of ROS non-contributory    No Known Allergies  BP 124/80   Pulse (!) 55   Temp (!) 97.3 F (36.3 C) (Temporal)   Ht 5\' 4"  (1.626 m)   Wt 132 lb 12.8 oz (60.2 kg)   SpO2 98%   BMI 22.80 kg/m   Physical Exam Constitutional:      General: She is not in acute distress.    Appearance: Normal appearance. She is not ill-appearing.  Pulmonary:     Effort: No respiratory distress.  Abdominal:     Tenderness: There is no abdominal tenderness. There is no right CVA tenderness or left CVA tenderness.  Neurological:     Mental Status: She is alert and oriented to person, place, and time.  Psychiatric:        Mood and Affect: Mood normal.        Behavior: Behavior normal.      A/P:  1. Urinary urgency 2. UTI symptoms - POCT Urinalysis Dipstick - + blood, leuks, protein,  - nitrites - Urine Culture Rx: - amoxicillin-clavulanate (AUGMENTIN) 875-125 MG tablet; Take 1 tablet by mouth 2 (two) times daily.  Dispense: 20 tablet; Refill: 0 - increase water intake - will contact pt with cx result once available Discussed plan and reviewed medications with patient, including risks, benefits, and potential side effects. Pt expressed understand. All questions answered.    This visit occurred during the SARS-CoV-2 public health emergency.  Safety protocols were in place, including screening questions prior to the visit, additional usage of staff PPE, and extensive cleaning of exam room while observing appropriate contact time as indicated for disinfecting solutions.

## 2020-09-02 LAB — URINE CULTURE
MICRO NUMBER:: 11136894
SPECIMEN QUALITY:: ADEQUATE

## 2020-09-03 ENCOUNTER — Other Ambulatory Visit: Payer: Self-pay | Admitting: Cardiovascular Disease

## 2020-09-03 ENCOUNTER — Telehealth: Payer: Self-pay | Admitting: Family Medicine

## 2020-09-03 NOTE — Telephone Encounter (Signed)
Patient notified Via phone that no other medication is needed.  No further questions.  Dm/cma

## 2020-09-03 NOTE — Telephone Encounter (Signed)
Patient is calling and stated that she just received her results from her urine sample and wanted to see if another rx was going to [facility] [address]. Informed patient that Dr. Loletha Grayer was out of the office and request would be sent to the provider of the day, please advice. CB is [phone]

## 2020-09-03 NOTE — Telephone Encounter (Signed)
*  STAT* If patient is at the pharmacy, call can be transferred to refill team.   1. Which medications need to be refilled? (please list name of each medication and dose if known) Xarelto- need a prescription until her mail order  Prescriptions comes  2. Which pharmacy/location (including street and city if local pharmacy) is medication to be sent to? [facility] [place]  3. Do they need a 30 day or 90 day supply? 30 days

## 2020-09-03 NOTE — Telephone Encounter (Signed)
Per urine culture, augmentin is adequate. 20tabs was sent by Dr. Loletha Grayer. Why is another rx needed?

## 2020-09-03 NOTE — Telephone Encounter (Signed)
Patient was asking if anything else needed sent in.  If nothing else is needed then I will advise patient that she is on the correct antibiotic.   Thanks. Marland Kitchendmc

## 2020-09-03 NOTE — Telephone Encounter (Signed)
(  Dr C patient) Spoke to patient, she is still having some burning sensation when urinating.  The culture results are back and she is asking if something else needs [facility]  She was given Amoxicillin Clavulanate 875-125 mg. Advised her that we would send to provider of the day.  Please review and advise.   Thanks.  Dm/cma

## 2020-09-04 ENCOUNTER — Other Ambulatory Visit: Payer: Self-pay | Admitting: Cardiology

## 2020-09-04 NOTE — Telephone Encounter (Signed)
*  STAT* If patient is at the pharmacy, call can be transferred to refill team.   1. Which medications need to be refilled? (please list name of each medication and dose if known)  Xarelto  2. Which pharmacy/location (including street and city if local pharmacy) is [facility] to [facility] [address]  3. Do they need a 30 day or 90 day supply? [facility] please send to mail order, patient need 30 asap.

## 2020-09-05 ENCOUNTER — Telehealth: Payer: Self-pay | Admitting: Family Medicine

## 2020-09-05 DIAGNOSIS — N390 Urinary tract infection, site not specified: Secondary | ICD-10-CM

## 2020-09-05 DIAGNOSIS — R32 Unspecified urinary incontinence: Secondary | ICD-10-CM

## 2020-09-05 NOTE — Telephone Encounter (Signed)
Caller Name: Jan Call back phone #: [phone]  Reason for Call: Pt states she is being treated for UTI. The past 2 nights she has been having bladder leakage. She is asking for advice/follow up. She asked if she needs to see a specialist.

## 2020-09-05 NOTE — Telephone Encounter (Signed)
Pt informed

## 2020-09-05 NOTE — Telephone Encounter (Signed)
Please see message and advise.  Thank you. ° °

## 2020-09-05 NOTE — Telephone Encounter (Signed)
The antibiotic she is taking should be treating the infection.  I will place a referral to urology - [facility].

## 2020-09-06 MED ORDER — RIVAROXABAN 15 MG PO TABS: ORAL_TABLET | ORAL | 1 refills | Status: DC

## 2020-09-17 ENCOUNTER — Telehealth: Payer: Self-pay

## 2020-09-17 NOTE — Telephone Encounter (Signed)
Pt calling in regards to her referral sent in on 09/05/20 by Dr. Loletha Grayer.  Pt stated that she has not heard from Urology yet.  I gave the pt the number to [facility] for [facility], where the referral was sent.  Ask pt to call back if she has any issues scheduling.

## 2020-09-19 ENCOUNTER — Other Ambulatory Visit: Payer: Self-pay

## 2020-09-20 ENCOUNTER — Encounter: Payer: Self-pay | Admitting: Family Medicine

## 2020-09-20 ENCOUNTER — Ambulatory Visit (INDEPENDENT_AMBULATORY_CARE_PROVIDER_SITE_OTHER): Payer: Medicare PPO | Admitting: Family Medicine

## 2020-09-20 VITALS — BP 138/76 | HR 59 | Temp 97.9°F | Ht 64.0 in | Wt 129.6 lb

## 2020-09-20 DIAGNOSIS — R3915 Urgency of urination: Secondary | ICD-10-CM | POA: Diagnosis not present

## 2020-09-20 DIAGNOSIS — N390 Urinary tract infection, site not specified: Secondary | ICD-10-CM

## 2020-09-20 DIAGNOSIS — R32 Unspecified urinary incontinence: Secondary | ICD-10-CM

## 2020-09-20 LAB — POCT URINALYSIS DIPSTICK
Blood, UA: NEGATIVE
Glucose, UA: NEGATIVE
Ketones, UA: NEGATIVE
Nitrite, UA: NEGATIVE
Protein, UA: NEGATIVE
Spec Grav, UA: 1.02 (ref 1.010–1.025)
Urobilinogen, UA: 0.2 E.U./dL
pH, UA: 6 (ref 5.0–8.0)

## 2020-09-20 MED ORDER — OXYBUTYNIN CHLORIDE ER 5 MG PO TB24: ORAL_TABLET | ORAL | 0 refills | Status: DC

## 2020-09-20 NOTE — Progress Notes (Signed)
Patricia Peck is a 74 y.o. female  Chief Complaint  Patient presents with  . Acute Visit    burning with urination, having incontiences,  was told that the referrsl that was placed will be after the first of the year. [facility] able [facility] on 10/18/20.     HPI: Patricia Peck is a 74 y.o. female who complains 2 episodes of urinary urgency with unrinary incontinence. No gross hematuria. No fever, chills. No n/v. No urinary frequency. No urinary urgency other than 2 episodes.  She occasionally has urinary leakage with sneezing, coughing, etc. - this is not new.  She is drinking lots of water.  She notes a burning sensation in vaginal area, not associated with urination.  Pt is leaving on 5 [place] on 10/01/20 and does not want to have to wear Depends on the trip. She is s/p TAH 30 years ago.  She has had 2 documented UTIs in the past 2 mo, most recently 08/31/20. Pt was referred to urology - [facility] Dr. Jacalyn Lefevre - earlier this month. Pt states she cannot be seen there until after the new year.   [facility] is able to see pt on 10/18/20. Referral, if needed, is to be faxed to [phone].   Past Medical History:  Diagnosis Date  . Allergy   . Arthritis    hands  . Asthma   . Atrial fibrillation ([place])   . Atrial flutter ([place])   . Depression   . Dysrhythmia    a-fib, on Xarelto  . GERD (gastroesophageal reflux disease)   . Hyperlipidemia   . Hypertension   . Hypothyroidism   . Osteoporosis   . TIA (transient ischemic attack)     Past Surgical History:  Procedure Laterality Date  . ABDOMINAL HYSTERECTOMY    . COLONOSCOPY    . MYRINGOTOMY WITH TUBE PLACEMENT Right 01/14/2016   Procedure: RIGHT MYRINGOTOMY WITH T-TUBE PLACEMENT;  Surgeon: Leta Baptist, MD;  Location: [place];  Service: ENT;  Laterality: Right;  . PARTIAL HYSTERECTOMY  1989  . TEE WITHOUT CARDIOVERSION N/A 12/22/2012   Procedure: TRANSESOPHAGEAL ECHOCARDIOGRAM  (TEE);  Surgeon: Lelon Perla, MD;  Location: [facility] ENDOSCOPY;  Service: Cardiovascular;  Laterality: N/A;    Social History   Socioeconomic History  . Marital status: Married    Spouse name: Not on file  . Number of children: 2  . Years of education: Not on file  . Highest education level: Not on file  Occupational History    Employer: RETIRED  Tobacco Use  . Smoking status: Never Smoker  . Smokeless tobacco: Never Used  Vaping Use  . Vaping Use: Never used  Substance and Sexual Activity  . Alcohol use: Yes    Comment: Rare  . Drug use: No  . Sexual activity: Yes  Other Topics Concern  . Not on file  Social History Narrative   Does have HPOA- sons Eddie Dibbles and Wille Glaser.   Does desire life support if not futile.   Not sure about feeding tubes.   Social Determinants of Health   Financial Resource Strain: Low Risk   . Difficulty of Paying Living Expenses: Not hard at all  Food Insecurity: No Food Insecurity  . Worried About Charity fundraiser in the Last Year: Never true  . Ran Out of Food in the Last Year: Never true  Transportation Needs: No Transportation Needs  . Lack of Transportation (Medical): No  .  Lack of Transportation (Non-Medical): No  Physical Activity: Inactive  . Days of Exercise per Week: 0 days  . Minutes of Exercise per Session: 0 min  Stress: No Stress Concern Present  . Feeling of Stress : Not at all  Social Connections: Moderately Isolated  . Frequency of Communication with Friends and Family: More than three times a week  . Frequency of Social Gatherings with Friends and Family: Once a week  . Attends Religious Services: Never  . Active Member of Clubs or Organizations: No  . Attends Archivist Meetings: Never  . Marital Status: Married  Human resources officer Violence: Not At Risk  . Fear of Current or Ex-Partner: No  . Emotionally Abused: No  . Physically Abused: No  . Sexually Abused: No    Family History  Problem Relation Age of Onset    . Breast cancer Mother   . Drug abuse Son   . Depression Son   . Stroke Paternal Aunt   . Heart disease Paternal Grandfather   . Diabetes Maternal Grandfather      Immunization History  Administered Date(s) Administered  . Fluad Quad(high Dose 65+) 06/30/2019  . Influenza Split 09/04/2011, 08/11/2012, 08/04/2017  . Influenza Whole 08/07/2009  . Influenza, High Dose Seasonal PF 08/28/2014, 06/29/2019  . Influenza,inj,Quad PF,6+ Mos 08/24/2013, 07/20/2015, 07/29/2016, 08/04/2017  . Influenza-Unspecified 07/10/2018, 07/14/2020  . PFIZER SARS-COV-2 Vaccination 11/23/2019, 01/04/2020, 08/30/2020  . Pneumococcal Conjugate-13 04/12/2014  . Pneumococcal Polysaccharide-23 02/05/2006, 04/08/2012  . Td 01/29/2005, 04/17/2015  . Zoster 04/16/2007  . Zoster Recombinat (Shingrix) 02/09/2018    Outpatient Encounter Medications as of 09/20/2020  Medication Sig  . ADVAIR DISKUS 100-50 MCG/DOSE AEPB Inhale 1 puff into the lungs in the morning and at bedtime. Maintain upcoming appt for additional refill  . Alpha-D-Galactosidase (BEANO PO) Take by mouth 2 (two) times daily.  Marland Kitchen amLODipine (NORVASC) 5 MG tablet Take 1 tablet (5 mg total) by mouth daily.  Marland Kitchen atorvastatin (LIPITOR) 80 MG tablet Take 1 tablet (80 mg total) by mouth daily.  . Bepotastine Besilate (BEPREVE) 1.5 % SOLN Use one drop in each eye twice a day  . bumetanide (BUMEX) 1 MG tablet Take 1 tablet (1 mg total) by mouth 2 (two) times daily.  Marland Kitchen buPROPion (WELLBUTRIN XL) 150 MG 24 hr tablet Take 1 tablet (150 mg total) by mouth daily.  . Calcium Carbonate-Vitamin D (CALCIUM 600/VITAMIN D) 600-400 MG-UNIT per tablet Take 1 tablet by mouth 2 (two) times daily.    . Cholecalciferol (VITAMIN D-3) 5000 UNITS TABS Take one tablet by mouth three times a week  . clonazePAM (KLONOPIN) 0.5 MG tablet TAKE 1 TABLET BY MOUTH AT BEDTIME AS NEEDED FOR ANXIETY  . denosumab (PROLIA) 60 MG/ML SOLN injection Inject 60 mg into the skin once. Administer in  upper arm, thigh, or abdomen  . diclofenac sodium (VOLTAREN) 1 % GEL Apply 2g to affected area qid prn  . famotidine (PEPCID AC) 10 MG chewable tablet Chew 1 tablet (10 mg total) by mouth daily.  Marland Kitchen ipratropium (ATROVENT) 0.06 % nasal spray Place 2 sprays into both nostrils 3 (three) times daily.  Marland Kitchen levocetirizine (XYZAL) 5 MG tablet Take 1 tablet (5 mg total) by mouth daily.  Marland Kitchen levothyroxine (SYNTHROID) 50 MCG tablet Take 1 tablet (50 mcg total) by mouth daily. Maintain upcoming appt for additional refill  . metoprolol succinate (TOPROL-XL) 50 MG 24 hr tablet Take 1 tablet (50 mg total) by mouth daily. Maintain upcoming appt for additional refill  .  NEXIUM 40 MG capsule Take 1 capsule (40 mg total) by mouth daily.  Marland Kitchen nystatin (MYCOSTATIN) 100000 UNIT/ML suspension SWISH BY MOUTH 10 ML UP TO 4 TIMES A DAY  . potassium chloride (KLOR-CON) 10 MEQ tablet Take 1 tablet (10 mEq total) by mouth 2 (two) times daily.  . Rivaroxaban (XARELTO) 15 MG TABS tablet TAKE 1 TABLET (15 MG TOTAL) BY MOUTH DAILY WITH SUPPER.  Marland Kitchen rOPINIRole (REQUIP) 0.25 MG tablet TAKE 1 TO 2 TABLETS BY MOUTH AT BEDTIME  . Suvorexant (BELSOMRA) 20 MG TABS Take 1 tablet by mouth at bedtime.  Marland Kitchen amoxicillin-clavulanate (AUGMENTIN) 875-125 MG tablet Take 1 tablet by mouth 2 (two) times daily. (Patient not taking: Reported on 09/20/2020)   No facility-administered encounter medications on file as of 09/20/2020.     ROS: Pertinent positives and negatives noted in HPI. Remainder of ROS non-contributory    No Known Allergies  BP 138/76   Pulse (!) 59   Temp 97.9 F (36.6 C) (Temporal)   Ht 5\' 4"  (1.626 m)   Wt 129 lb 9.6 oz (58.8 kg)   SpO2 98%   BMI 22.25 kg/m   Physical Exam Constitutional:      General: She is not in acute distress.    Appearance: Normal appearance. She is normal weight. She is not ill-appearing.  Abdominal:     General: There is no distension.     Tenderness: There is no abdominal tenderness. There  is no right CVA tenderness or left CVA tenderness.  Neurological:     Mental Status: She is alert.  Psychiatric:        Mood and Affect: Mood normal.        Behavior: Behavior normal.     Component     Latest Ref Rng & Units 09/20/2020  Color, UA      yelllow  Clarity, UA      clear  Glucose     Negative Negative  Bilirubin, UA      1+  Ketones, UA      neg  Specific Gravity, UA     1.010 - 1.025 1.020  RBC, UA      neg  pH, UA     5.0 - 8.0 6.0  Protein,UA     Negative Negative  Urobilinogen, UA     0.2 or 1.0 E.U./dL 0.2  Nitrite, UA      neg  Leukocytes,UA     Negative Large (3+) (A)     A/P:   1. Recurrent UTI - POCT Urinalysis Dipstick - Urine Culture - will defer starting abx until cx result is available and pt is agreeable to this  2. Urinary urgency 3. Urinary incontinence, unspecified type - 2 episodes in the past few weeks - POCT Urinalysis Dipstick - Urine Culture Rx: - oxybutynin (DITROPAN-XL) 5 MG 24 hr tablet; 1 tab po daily; may increase to 2 tabs daily after 1 week if needed  Dispense: 60 tablet; Refill: 0 - urology referral previously placed to Dr. Claudia Desanctis at [facility]. Pt states she was told there was no availability until the new year. I spoke with Dr. Claudia Desanctis today and she states she is able to see pt prior to 10/18/20 and her office will reach out to pt to schedule appt.   Discussed plan and reviewed medications with patient, including risks, benefits, and potential side effects. Pt expressed understand. All questions answered.  This visit occurred during the SARS-CoV-2 public health emergency.  Safety protocols were  in place, including screening questions prior to the visit, additional usage of staff PPE, and extensive cleaning of exam room while observing appropriate contact time as indicated for disinfecting solutions.

## 2020-09-26 DIAGNOSIS — N952 Postmenopausal atrophic vaginitis: Secondary | ICD-10-CM | POA: Diagnosis not present

## 2020-09-26 DIAGNOSIS — N3021 Other chronic cystitis with hematuria: Secondary | ICD-10-CM | POA: Diagnosis not present

## 2020-09-26 DIAGNOSIS — R3915 Urgency of urination: Secondary | ICD-10-CM | POA: Diagnosis not present

## 2020-09-26 DIAGNOSIS — N3946 Mixed incontinence: Secondary | ICD-10-CM | POA: Diagnosis not present

## 2020-09-29 DIAGNOSIS — Z20822 Contact with and (suspected) exposure to covid-19: Secondary | ICD-10-CM | POA: Diagnosis not present

## 2020-10-05 ENCOUNTER — Other Ambulatory Visit: Payer: Self-pay | Admitting: Family Medicine

## 2020-10-05 DIAGNOSIS — R3915 Urgency of urination: Secondary | ICD-10-CM

## 2020-10-05 DIAGNOSIS — R32 Unspecified urinary incontinence: Secondary | ICD-10-CM

## 2020-10-10 NOTE — Progress Notes (Signed)
HPI: FU atrial fibrillation and TIA. Echocardiogram in January of 2014 showed normal LV function, mild left ventricular hypertrophy, mild aortic insufficiency. A patent foramen ovale could not be excluded. Brain MRI in January 2014 showed no acute infarct, mild small vessel disease. TEE 2/14 showed normal LV function, mild mitral valve prolapse with mild mitral regurgitation,  trace aortic insufficiency. There was no PFO. Monitor in March of 2014 showed paroxysmal atrial fibrillation/flutter. She was therefore started on anticoagulation. Carotid Dopplers 10/20 showed 1-39% bilateral stenosis; left subclavian stenosis noted. Cardiac CTA October 2020 showed calcium score 7 but no obstructive coronary disease. Monitor June 2021 showed sinus rhythm with PACs and PAF.  Since I last sawher she denies dyspnea, chest pain, palpitations or syncope.   Current Outpatient Medications  Medication Sig Dispense Refill  . ADVAIR DISKUS 100-50 MCG/DOSE AEPB Inhale 1 puff into the lungs in the morning and at bedtime. Maintain upcoming appt for additional refill 60 each 0  . Alpha-D-Galactosidase (BEANO PO) Take by mouth 2 (two) times daily.    Marland Kitchen amoxicillin-clavulanate (AUGMENTIN) 875-125 MG tablet Take 1 tablet by mouth 2 (two) times daily. 20 tablet 0  . atorvastatin (LIPITOR) 80 MG tablet Take 1 tablet (80 mg total) by mouth daily. 90 tablet 3  . Bepotastine Besilate 1.5 % SOLN Use one drop in each eye twice a day    . bumetanide (BUMEX) 1 MG tablet Take 1 tablet (1 mg total) by mouth 2 (two) times daily. 180 tablet 2  . buPROPion (WELLBUTRIN XL) 150 MG 24 hr tablet Take 1 tablet (150 mg total) by mouth daily. 90 tablet 1  . Calcium Carbonate-Vitamin D 600-400 MG-UNIT tablet Take 1 tablet by mouth 2 (two) times daily.    . Cholecalciferol (VITAMIN D-3) 5000 UNITS TABS Take one tablet by mouth three times a week    . ciprofloxacin (CIPRO) 500 MG tablet Take 500 mg by mouth 2 (two) times daily.    .  clonazePAM (KLONOPIN) 0.5 MG tablet TAKE 1 TABLET BY MOUTH AT BEDTIME AS NEEDED FOR ANXIETY 30 tablet 0  . denosumab (PROLIA) 60 MG/ML SOLN injection Inject 60 mg into the skin once. Administer in upper arm, thigh, or abdomen 180 mL 0  . diclofenac sodium (VOLTAREN) 1 % GEL Apply 2g to affected area qid prn 100 g 3  . famotidine (PEPCID AC) 10 MG chewable tablet Chew 1 tablet (10 mg total) by mouth daily. 90 tablet prm  . ipratropium (ATROVENT) 0.06 % nasal spray Place 2 sprays into both nostrils 3 (three) times daily. 15 mL 1  . levocetirizine (XYZAL) 5 MG tablet Take 1 tablet (5 mg total) by mouth daily. 90 tablet 1  . levothyroxine (SYNTHROID) 50 MCG tablet Take 1 tablet (50 mcg total) by mouth daily. Maintain upcoming appt for additional refill 90 tablet 1  . metoprolol succinate (TOPROL-XL) 50 MG 24 hr tablet Take 1 tablet (50 mg total) by mouth daily. Maintain upcoming appt for additional refill 90 tablet 2  . NEXIUM 40 MG capsule Take 1 capsule (40 mg total) by mouth daily. 90 capsule 1  . nystatin (MYCOSTATIN) 100000 UNIT/ML suspension SWISH BY MOUTH 10 ML UP TO 4 TIMES A DAY 240 mL 1  . oxybutynin (DITROPAN-XL) 5 MG 24 hr tablet TAKE 1 TABLET DAILY MAY INCREASE TO 2 TABS DAILY AFTER 1 WEEK IF NEEDED 60 tablet 0  . potassium chloride (KLOR-CON) 10 MEQ tablet Take 1 tablet (10 mEq total) by mouth  2 (two) times daily. 180 tablet 1  . Rivaroxaban (XARELTO) 15 MG TABS tablet TAKE 1 TABLET (15 MG TOTAL) BY MOUTH DAILY WITH SUPPER. 90 tablet 1  . rOPINIRole (REQUIP) 0.25 MG tablet TAKE 1 TO 2 TABLETS BY MOUTH AT BEDTIME 180 tablet 1  . Suvorexant (BELSOMRA) 20 MG TABS Take 1 tablet by mouth at bedtime. 90 tablet 1  . amLODipine (NORVASC) 5 MG tablet Take 1 tablet (5 mg total) by mouth daily. 90 tablet 2   No current facility-administered medications for this visit.     Past Medical History:  Diagnosis Date  . Allergy   . Arthritis    hands  . Asthma   . Atrial fibrillation ([place])   .  Atrial flutter ([place])   . Depression   . Dysrhythmia    a-fib, on Xarelto  . GERD (gastroesophageal reflux disease)   . Hyperlipidemia   . Hypertension   . Hypothyroidism   . Osteoporosis   . TIA (transient ischemic attack)     Past Surgical History:  Procedure Laterality Date  . ABDOMINAL HYSTERECTOMY    . COLONOSCOPY    . MYRINGOTOMY WITH TUBE PLACEMENT Right 01/14/2016   Procedure: RIGHT MYRINGOTOMY WITH T-TUBE PLACEMENT;  Surgeon: Leta Baptist, MD;  Location: [place];  Service: ENT;  Laterality: Right;  . PARTIAL HYSTERECTOMY  1989  . TEE WITHOUT CARDIOVERSION N/A 12/22/2012   Procedure: TRANSESOPHAGEAL ECHOCARDIOGRAM (TEE);  Surgeon: Lelon Perla, MD;  Location: [facility] ENDOSCOPY;  Service: Cardiovascular;  Laterality: N/A;    Social History   Socioeconomic History  . Marital status: Married    Spouse name: Not on file  . Number of children: 2  . Years of education: Not on file  . Highest education level: Not on file  Occupational History    Employer: RETIRED  Tobacco Use  . Smoking status: Never Smoker  . Smokeless tobacco: Never Used  Vaping Use  . Vaping Use: Never used  Substance and Sexual Activity  . Alcohol use: Yes    Comment: Rare  . Drug use: No  . Sexual activity: Yes  Other Topics Concern  . Not on file  Social History Narrative   Does have HPOA- sons Eddie Dibbles and Wille Glaser.   Does desire life support if not futile.   Not sure about feeding tubes.   Social Determinants of Health   Financial Resource Strain: Low Risk   . Difficulty of Paying Living Expenses: Not hard at all  Food Insecurity: No Food Insecurity  . Worried About Charity fundraiser in the Last Year: Never true  . Ran Out of Food in the Last Year: Never true  Transportation Needs: No Transportation Needs  . Lack of Transportation (Medical): No  . Lack of Transportation (Non-Medical): No  Physical Activity: Inactive  . Days of Exercise per Week: 0 days  . Minutes of Exercise  per Session: 0 min  Stress: No Stress Concern Present  . Feeling of Stress : Not at all  Social Connections: Moderately Isolated  . Frequency of Communication with Friends and Family: More than three times a week  . Frequency of Social Gatherings with Friends and Family: Once a week  . Attends Religious Services: Never  . Active Member of Clubs or Organizations: No  . Attends Archivist Meetings: Never  . Marital Status: Married  Human resources officer Violence: Not At Risk  . Fear of Current or Ex-Partner: No  . Emotionally Abused: No  .  Physically Abused: No  . Sexually Abused: No    Family History  Problem Relation Age of Onset  . Breast cancer Mother   . Drug abuse Son   . Depression Son   . Stroke Paternal Aunt   . Heart disease Paternal Grandfather   . Diabetes Maternal Grandfather     ROS: Some problems with dysuria but no fevers or chills, productive cough, hemoptysis, dysphasia, odynophagia, melena, hematochezia, dysuria, hematuria, rash, seizure activity, orthopnea, PND, pedal edema, claudication. Remaining systems are negative.  Physical Exam: Well-developed well-nourished in no acute distress.  Skin is warm and dry.  HEENT is normal.  Neck is supple.  Chest is clear to auscultation with normal expansion.  Cardiovascular exam is regular rate and rhythm.  Abdominal exam nontender or distended. No masses palpated. Extremities show no edema. neuro grossly intact  A/P  1 paroxysmal atrial fibrillation-patient remains in atrial fibrillation.  She remains asymptomatic.  I therefore feel that rate control and anticoagulation is appropriate.  Continue Toprol at present dose.  Continue Xarelto.  Check hemoglobin and renal function.  2 hypertension-patient's blood pressure is controlled.  Continue present medical regimen and follow.  3 hyperlipidemia-continue statin.  Recent LDL 88.  Add Zetia 10 mg daily.  Check lipids and liver in 12 weeks.  4 subclavian  stenosis-patient denies symptoms.  Continue medical therapy.  5 history of mitral valve prolapse with mild mitral vegetation-plan repeat echocardiogram.  6 carotid artery disease-mild on most recent Dopplers.  Continue medical therapy.  Kirk Ruths, MD

## 2020-10-15 DIAGNOSIS — E7800 Pure hypercholesterolemia, unspecified: Secondary | ICD-10-CM | POA: Diagnosis not present

## 2020-10-15 LAB — HEPATIC FUNCTION PANEL
ALT: 16 IU/L (ref 0–32)
AST: 16 IU/L (ref 0–40)
Albumin: 4.3 g/dL (ref 3.7–4.7)
Alkaline Phosphatase: 119 IU/L (ref 44–121)
Bilirubin Total: 0.4 mg/dL (ref 0.0–1.2)
Bilirubin, Direct: 0.12 mg/dL (ref 0.00–0.40)
Total Protein: 7.2 g/dL (ref 6.0–8.5)

## 2020-10-15 LAB — LIPID PANEL
Chol/HDL Ratio: 4.3 ratio (ref 0.0–4.4)
Cholesterol, Total: 152 mg/dL (ref 100–199)
HDL: 35 mg/dL — ABNORMAL LOW (ref >39)
LDL Chol Calc (NIH): 88 mg/dL (ref 0–99)
Triglycerides: 166 mg/dL — ABNORMAL HIGH (ref 0–149)
VLDL Cholesterol Cal: 29 mg/dL (ref 5–40)

## 2020-10-17 DIAGNOSIS — R3915 Urgency of urination: Secondary | ICD-10-CM | POA: Diagnosis not present

## 2020-10-17 DIAGNOSIS — M6281 Muscle weakness (generalized): Secondary | ICD-10-CM | POA: Diagnosis not present

## 2020-10-17 DIAGNOSIS — N3946 Mixed incontinence: Secondary | ICD-10-CM | POA: Diagnosis not present

## 2020-10-17 DIAGNOSIS — M62838 Other muscle spasm: Secondary | ICD-10-CM | POA: Diagnosis not present

## 2020-10-17 DIAGNOSIS — M6289 Other specified disorders of muscle: Secondary | ICD-10-CM | POA: Diagnosis not present

## 2020-10-18 ENCOUNTER — Ambulatory Visit (INDEPENDENT_AMBULATORY_CARE_PROVIDER_SITE_OTHER): Payer: Medicare PPO | Admitting: Cardiology

## 2020-10-18 ENCOUNTER — Other Ambulatory Visit: Payer: Self-pay

## 2020-10-18 ENCOUNTER — Encounter: Payer: Self-pay | Admitting: Cardiology

## 2020-10-18 VITALS — BP 124/72 | HR 61 | Ht 64.0 in | Wt 124.8 lb

## 2020-10-18 DIAGNOSIS — I480 Paroxysmal atrial fibrillation: Secondary | ICD-10-CM

## 2020-10-18 DIAGNOSIS — I771 Stricture of artery: Secondary | ICD-10-CM | POA: Diagnosis not present

## 2020-10-18 DIAGNOSIS — I059 Rheumatic mitral valve disease, unspecified: Secondary | ICD-10-CM | POA: Diagnosis not present

## 2020-10-18 DIAGNOSIS — I10 Essential (primary) hypertension: Secondary | ICD-10-CM | POA: Diagnosis not present

## 2020-10-18 DIAGNOSIS — E7800 Pure hypercholesterolemia, unspecified: Secondary | ICD-10-CM | POA: Diagnosis not present

## 2020-10-18 MED ORDER — EZETIMIBE 10 MG PO TABS: 10.0000 mg | ORAL_TABLET | Freq: Every day | ORAL | 3 refills | Status: DC

## 2020-10-18 NOTE — Patient Instructions (Signed)
Medication Instructions:   START EZETIMIBE 10 MG ONCE DAILY  *If you need a refill on your cardiac medications before your next appointment, please call your pharmacy*   Lab Work:  Your physician recommends that you return for lab work in: [place]  If you have labs (blood work) drawn today and your tests are completely normal, you will receive your results only by: Marland Kitchen MyChart Message (if you have MyChart) OR . A paper copy in the mail If you have any lab test that is abnormal or we need to change your treatment, we will call you to review the results.   Testing/Procedures:  Your physician has requested that you have an echocardiogram. Echocardiography is a painless test that uses sound waves to create images of your heart. It provides your doctor with information about the size and shape of your heart and how well your heart's chambers and valves are working. This procedure takes approximately one hour. There are no restrictions for this procedure.[place]     Follow-Up: At [facility], you and your health needs are our priority.  As part of our continuing mission to provide you with exceptional heart care, we have created designated Provider Care Teams.  These Care Teams include your primary Cardiologist (physician) and Advanced Practice Providers (APPs -  Physician Assistants and Nurse Practitioners) who all work together to provide you with the care you need, when you need it.  We recommend signing up for the patient portal called "MyChart".  Sign up information is provided on this After Visit Summary.  MyChart is used to connect with patients for Virtual Visits (Telemedicine).  Patients are able to view lab/test results, encounter notes, upcoming appointments, etc.  Non-urgent messages can be sent to your provider as well.   To learn more about what you can do with MyChart, go to [email or web address].    Your next appointment:   6 month(s)  The format  for your next appointment:   In Person  Provider:   Kirk Ruths, MD

## 2020-10-31 DIAGNOSIS — N952 Postmenopausal atrophic vaginitis: Secondary | ICD-10-CM | POA: Diagnosis not present

## 2020-10-31 DIAGNOSIS — M62838 Other muscle spasm: Secondary | ICD-10-CM | POA: Diagnosis not present

## 2020-10-31 DIAGNOSIS — M6289 Other specified disorders of muscle: Secondary | ICD-10-CM | POA: Diagnosis not present

## 2020-10-31 DIAGNOSIS — N3021 Other chronic cystitis with hematuria: Secondary | ICD-10-CM | POA: Diagnosis not present

## 2020-10-31 DIAGNOSIS — R3915 Urgency of urination: Secondary | ICD-10-CM | POA: Diagnosis not present

## 2020-10-31 DIAGNOSIS — N3946 Mixed incontinence: Secondary | ICD-10-CM | POA: Diagnosis not present

## 2020-10-31 DIAGNOSIS — M6281 Muscle weakness (generalized): Secondary | ICD-10-CM | POA: Diagnosis not present

## 2020-11-12 ENCOUNTER — Telehealth: Payer: Self-pay | Admitting: Family Medicine

## 2020-11-12 NOTE — Telephone Encounter (Signed)
Patient is calling to let the nurse know that she needs a prior authorization on her Nexium. She states that paperwork is being faxed over. Please give her a call if you have any questions.

## 2020-11-13 DIAGNOSIS — J4530 Mild persistent asthma, uncomplicated: Secondary | ICD-10-CM | POA: Diagnosis not present

## 2020-11-13 DIAGNOSIS — J300 Vasomotor rhinitis: Secondary | ICD-10-CM | POA: Diagnosis not present

## 2020-11-13 DIAGNOSIS — H6990 Unspecified Eustachian tube disorder, unspecified ear: Secondary | ICD-10-CM | POA: Diagnosis not present

## 2020-11-13 DIAGNOSIS — H1045 Other chronic allergic conjunctivitis: Secondary | ICD-10-CM | POA: Diagnosis not present

## 2020-11-14 ENCOUNTER — Other Ambulatory Visit: Payer: Self-pay

## 2020-11-14 DIAGNOSIS — Z79899 Other long term (current) drug therapy: Secondary | ICD-10-CM | POA: Diagnosis not present

## 2020-11-14 DIAGNOSIS — M19011 Primary osteoarthritis, right shoulder: Secondary | ICD-10-CM | POA: Diagnosis not present

## 2020-11-14 DIAGNOSIS — M5412 Radiculopathy, cervical region: Secondary | ICD-10-CM | POA: Diagnosis not present

## 2020-11-14 DIAGNOSIS — M47812 Spondylosis without myelopathy or radiculopathy, cervical region: Secondary | ICD-10-CM | POA: Diagnosis not present

## 2020-11-14 DIAGNOSIS — Z7901 Long term (current) use of anticoagulants: Secondary | ICD-10-CM | POA: Diagnosis not present

## 2020-11-14 DIAGNOSIS — M542 Cervicalgia: Secondary | ICD-10-CM | POA: Diagnosis not present

## 2020-11-14 NOTE — Telephone Encounter (Signed)
PA has been approved by [facility]  Till 11/02/21.   LFT VM to inform that it has been approved and to call if any questions.  Dm/cma

## 2020-11-15 ENCOUNTER — Encounter: Payer: Self-pay | Admitting: Nurse Practitioner

## 2020-11-15 ENCOUNTER — Ambulatory Visit (INDEPENDENT_AMBULATORY_CARE_PROVIDER_SITE_OTHER): Payer: Medicare PPO | Admitting: Nurse Practitioner

## 2020-11-15 VITALS — BP 140/70 | HR 64 | Temp 96.4°F | Ht 64.0 in | Wt 127.8 lb

## 2020-11-15 DIAGNOSIS — M25511 Pain in right shoulder: Secondary | ICD-10-CM

## 2020-11-15 NOTE — Patient Instructions (Addendum)
Take medications as prescribed No change needed at this time.

## 2020-11-15 NOTE — Progress Notes (Signed)
Subjective:  Patient ID: Patricia Peck, female    DOB: 08/27/1946  Age: 75 y.o. MRN: [id number]  CC: Acute Visit ([name] upper back discomfort x1 week, pt states she went to ER yesterday due to the severity of her pain, pt has her ER visit notes with her. )  Shoulder Pain  The pain is present in the right shoulder. This is a new problem. The current episode started 1 to 4 weeks ago. There has been no history of extremity trauma. The problem occurs intermittently. The problem has been waxing and waning. The quality of the pain is described as aching and dull. Associated symptoms include stiffness. Pertinent negatives include no fever, inability to bear weight, itching, joint locking, joint swelling, limited range of motion, numbness or tingling. The symptoms are aggravated by activity. She has tried rest for the symptoms. The treatment provided no relief. Her past medical history is significant for osteoarthritis.  reviewed ED notes and radiology report: prednisone, robaxin and norco prescribed. She has not take medications. She denies worsening pain or new symptoms since ED visit.  BP Readings from Last 3 Encounters:  11/15/20 140/70  10/18/20 124/72  09/20/20 138/76   Reviewed past Medical, Social and Family history today.  Outpatient Medications Prior to Visit  Medication Sig Dispense Refill  . ADVAIR DISKUS 100-50 MCG/DOSE AEPB Inhale 1 puff into the lungs in the morning and at bedtime. Maintain upcoming appt for additional refill 60 each 0  . Alpha-D-Galactosidase (BEANO PO) Take by mouth 2 (two) times daily.    Marland Kitchen atorvastatin (LIPITOR) 80 MG tablet Take 1 tablet (80 mg total) by mouth daily. 90 tablet 3  . Bepotastine Besilate 1.5 % SOLN Use one drop in each eye twice a day    . bumetanide (BUMEX) 1 MG tablet Take 1 tablet (1 mg total) by mouth 2 (two) times daily. 180 tablet 2  . Calcium Carbonate-Vitamin D 600-400 MG-UNIT tablet Take 1 tablet by mouth 2 (two) times daily.    .  Cholecalciferol (VITAMIN D-3) 5000 UNITS TABS Take one tablet by mouth three times a week    . clonazePAM (KLONOPIN) 0.5 MG tablet TAKE 1 TABLET BY MOUTH AT BEDTIME AS NEEDED FOR ANXIETY 30 tablet 0  . denosumab (PROLIA) 60 MG/ML SOLN injection Inject 60 mg into the skin once. Administer in upper arm, thigh, or abdomen 180 mL 0  . diclofenac sodium (VOLTAREN) 1 % GEL Apply 2g to affected area qid prn 100 g 3  . ezetimibe (ZETIA) 10 MG tablet Take 1 tablet (10 mg total) by mouth daily. 90 tablet 3  . famotidine (PEPCID AC) 10 MG chewable tablet Chew 1 tablet (10 mg total) by mouth daily. 90 tablet prm  . HYDROcodone-acetaminophen (NORCO/VICODIN) 5-325 MG tablet Take 1 tablet by mouth every 6 (six) hours as needed for moderate pain.    Marland Kitchen ipratropium (ATROVENT) 0.06 % nasal spray Place 2 sprays into both nostrils 3 (three) times daily. 15 mL 1  . levocetirizine (XYZAL) 5 MG tablet Take 1 tablet (5 mg total) by mouth daily. 90 tablet 1  . levothyroxine (SYNTHROID) 50 MCG tablet Take 1 tablet (50 mcg total) by mouth daily. Maintain upcoming appt for additional refill 90 tablet 1  . methocarbamol (ROBAXIN) 500 MG tablet Take 500 mg by mouth 4 (four) times daily.    . methylPREDNISolone (MEDROL) 4 MG tablet Take 4 mg by mouth daily.    . metoprolol succinate (TOPROL-XL) 50 MG 24 hr tablet  Take 1 tablet (50 mg total) by mouth daily. Maintain upcoming appt for additional refill 90 tablet 2  . NEXIUM 40 MG capsule Take 1 capsule (40 mg total) by mouth daily. 90 capsule 1  . nystatin (MYCOSTATIN) 100000 UNIT/ML suspension SWISH BY MOUTH 10 ML UP TO 4 TIMES A DAY 240 mL 1  . oxybutynin (DITROPAN-XL) 5 MG 24 hr tablet TAKE 1 TABLET DAILY MAY INCREASE TO 2 TABS DAILY AFTER 1 WEEK IF NEEDED 60 tablet 0  . potassium chloride (KLOR-CON) 10 MEQ tablet Take 1 tablet (10 mEq total) by mouth 2 (two) times daily. 180 tablet 1  . Rivaroxaban (XARELTO) 15 MG TABS tablet TAKE 1 TABLET (15 MG TOTAL) BY MOUTH DAILY WITH  SUPPER. 90 tablet 1  . rOPINIRole (REQUIP) 0.25 MG tablet TAKE 1 TO 2 TABLETS BY MOUTH AT BEDTIME 180 tablet 1  . Suvorexant (BELSOMRA) 20 MG TABS Take 1 tablet by mouth at bedtime. 90 tablet 1  . amLODipine (NORVASC) 5 MG tablet Take 1 tablet (5 mg total) by mouth daily. 90 tablet 2  . amoxicillin-clavulanate (AUGMENTIN) 875-125 MG tablet Take 1 tablet by mouth 2 (two) times daily. (Patient not taking: Reported on 11/15/2020) 20 tablet 0  . buPROPion (WELLBUTRIN XL) 150 MG 24 hr tablet Take 1 tablet (150 mg total) by mouth daily. (Patient not taking: Reported on 11/15/2020) 90 tablet 1  . ciprofloxacin (CIPRO) 500 MG tablet Take 500 mg by mouth 2 (two) times daily. (Patient not taking: Reported on 11/15/2020)     No facility-administered medications prior to visit.    ROS See HPI  Objective:  BP 140/70 (BP Location: Right Arm, Patient Position: Sitting, Cuff Size: Normal)   Pulse 64   Temp (!) 96.4 F (35.8 C) (Temporal)   Ht 5\' 4"  (1.626 m)   Wt 127 lb 12.8 oz (58 kg)   SpO2 99%   BMI 21.94 kg/m   Physical Exam Constitutional:      General: She is not in acute distress. Cardiovascular:     Pulses: Normal pulses.  Pulmonary:     Effort: Pulmonary effort is normal.  Musculoskeletal:     Right shoulder: Tenderness present. No effusion or bony tenderness. Normal range of motion. Normal strength. Normal pulse.     Left shoulder: Normal.     Right upper arm: Normal.     Left upper arm: Normal.     Cervical back: Normal range of motion and neck supple.  Skin:    Findings: No rash.  Neurological:     Mental Status: She is alert and oriented to person, place, and time.    Assessment & Plan:  This visit occurred during the SARS-CoV-2 public health emergency.  Safety protocols were in place, including screening questions prior to the visit, additional usage of staff PPE, and extensive cleaning of exam room while observing appropriate contact time as indicated for disinfecting  solutions.   Patricia Peck was seen today for acute visit.  Diagnoses and all orders for this visit:  Acute pain of right shoulder  Advised to take medications as prescribed. Avoid taking klonopin and norco at same time due to risk of oversedation. take medications with food. Do not take medications and drive due to risk of dizziness.  Problem List Items Addressed This Visit   None   Visit Diagnoses    Acute pain of right shoulder    -  Primary      Follow-up: No follow-ups on file.  Wilfred Lacy,  NP

## 2020-11-18 ENCOUNTER — Encounter: Payer: Self-pay | Admitting: Nurse Practitioner

## 2020-11-20 ENCOUNTER — Other Ambulatory Visit (HOSPITAL_COMMUNITY): Payer: Medicare PPO

## 2020-11-26 ENCOUNTER — Encounter: Payer: Self-pay | Admitting: Nurse Practitioner

## 2020-11-26 DIAGNOSIS — M25511 Pain in right shoulder: Secondary | ICD-10-CM

## 2020-11-26 DIAGNOSIS — M5030 Other cervical disc degeneration, unspecified cervical region: Secondary | ICD-10-CM

## 2020-11-27 ENCOUNTER — Other Ambulatory Visit: Payer: Self-pay

## 2020-11-27 ENCOUNTER — Ambulatory Visit (INDEPENDENT_AMBULATORY_CARE_PROVIDER_SITE_OTHER): Payer: Medicare PPO | Admitting: Family Medicine

## 2020-11-27 ENCOUNTER — Ambulatory Visit (INDEPENDENT_AMBULATORY_CARE_PROVIDER_SITE_OTHER): Payer: Medicare PPO

## 2020-11-27 VITALS — BP 116/76 | HR 76 | Wt 131.0 lb

## 2020-11-27 DIAGNOSIS — M62838 Other muscle spasm: Secondary | ICD-10-CM | POA: Diagnosis not present

## 2020-11-27 DIAGNOSIS — M810 Age-related osteoporosis without current pathological fracture: Secondary | ICD-10-CM

## 2020-11-27 MED ORDER — TIZANIDINE HCL 2 MG PO TABS: 2.0000 mg | ORAL_TABLET | Freq: Three times a day (TID) | ORAL | 2 refills | Status: DC | PRN

## 2020-11-27 MED ORDER — DENOSUMAB 60 MG/ML ~~LOC~~ SOSY
60.0000 mg | PREFILLED_SYRINGE | Freq: Once | SUBCUTANEOUS | Status: AC
  Administered 2020-11-27: 60 mg via SUBCUTANEOUS

## 2020-11-27 NOTE — Progress Notes (Signed)
Per orders of Dr. Bryan Lemma, injection of Prolia given in left arm by Brendell Tyus .  Patient tolerated injection well. Pt will return 77months

## 2020-11-27 NOTE — Progress Notes (Signed)
I, [name], LAT, ATC, am serving as scribe for Dr. [name].  Patricia Peck is a 75 y.o. female who presents to [facility] today for neck pain.  She was last seen by Dr. Georgina Snell on 04/09/20 for her L foot.  Since then, pt reports new neck pain since 2 weeks ago. Pt was seen in ED 11/14/20.  She locates her pain to R side of c-spine and into R trap.  Patient is leaving on a longer than 1 month cruise to the [place] at the end of February.  She needs to make the decision of whether she can go home on February 5.  Radiating pain: No UE numbness/tingling: No Aggravating factors: cervical movements, Treatments tried: creams, hot pads, hydrocondone-acetaminophen, methocarbamol, methylprednisone   Pertinent review of systems: No fevers or chills  Relevant historical information: Hypertension, hypothyroidism   Exam:  BP 116/76 (BP Location: Right Arm, Patient Position: Sitting, Cuff Size: Normal)   Pulse 76   Wt 131 lb (59.4 kg)   SpO2 98%   BMI 22.49 kg/m  General: Well Developed, well nourished, and in no acute distress.   MSK: C-spine: Normal-appearing Nontender midline. Tender palpation cervical paraspinal musculature. Decreased cervical motion to full extension and rotation and lateral flexion bilaterally. Upper extremity strength reflexes and sensation are equal and normal throughout.    Lab and Radiology Results  XR SHOULDER MIN 2 VIEWS RIGHT  Narrative:  INDICATION: Pain  COMPARISON: None  TECHNIQUE: 3 views right shoulder  FINDINGS:  Mild degenerative changes. No fracture or subluxations. No calcific tendinitis. Unremarkable tissues.  Impression:  IMPRESSION:  Mild degenerative changes without acute process.  Electronically Signed by: Shara Blazing  XR CERVICAL SPINE AP LATERAL AND OBLIQUE(S)  Narrative:  INDICATION: Cervicalgia  TECHNIQUE: XR CERVICAL SPINE AP LATERAL AND OBLIQUE(S)  FINDINGS:  No acute  fracture or traumatic malalignment identified.  Vertebral body heights intact. 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 No prevertebral soft tissue swelling identified.  No radiopaque foreign body identified.   Impression:  IMPRESSION:   Negative for acute fracture or traumatic malalignment. 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  Electronically Signed by: Zettie Pho       Assessment and Plan: 75 y.o. female with neck pain bilaterally ongoing for about 2 weeks.  Pain thought to be due to neck muscle spasm and dysfunction.  Doubtful for cervical radiculopathy at this time.  Plan for physical therapy heating pad TENS unit and limited tizanidine.  Recheck on February 4.  We can make a joint decision about whether or not she should go on the cruise at that point.    PDMP not reviewed this encounter. Orders Placed This Encounter  Procedures  . Ambulatory referral to Physical Therapy    Referral Priority:   Routine    Referral Type:   Physical Medicine    Referral Reason:   Specialty Services Required    Requested Specialty:   Physical Therapy    Number of Visits Requested:   1   Meds ordered this encounter  Medications  . tiZANidine (ZANAFLEX) 2 MG tablet    Sig: Take 1-2 tablets (2-4 mg total) by mouth every 8 (eight) hours as needed for muscle spasms.    Dispense:  60 tablet    Refill:  2     Discussed warning signs or symptoms. Please see discharge instructions. Patient expresses understanding.   The above documentation has been reviewed and is accurate and complete Ellard Artis  Georgina Snell, M.D.

## 2020-11-27 NOTE — Patient Instructions (Addendum)
Thank you for coming in today.  Try the tizanidine at bedtime mostly for muscle spasm.  Ok to use up to 3x daily.   Use heating pad and TENS unit.   I've referred you to Physical Therapy.  Let us know if you don't hear from them in one week.   TENS UNIT: This is helpful for muscle pain and spasm.   Search and Purchase a TENS 7000 2nd edition at  www.tenspros.com or www.[employer].com It should be less than $30.     TENS unit instructions: Do not shower or bathe with the unit on . Turn the unit off before removing electrodes or batteries . If the electrodes lose stickiness add a drop of water to the electrodes after they are disconnected from the unit and place on plastic sheet. If you continued to have difficulty, call the TENS unit company to purchase more electrodes. . Do not apply lotion on the skin area prior to use. Make sure the skin is clean and dry as this will help prolong the life of the electrodes. . After use, always check skin for unusual red areas, rash or other skin difficulties. If there are any skin problems, does not apply electrodes to the same area. . Never remove the electrodes from the unit by pulling the wires. . Do not use the TENS unit or electrodes other than as directed. . Do not change electrode placement without consultating your therapist or physician. Marland Kitchen Keep 2 fingers with between each electrode. . Wear time ratio is 2:1, on to off times.    For example on for 30 minutes off for 15 minutes and then on for 30 minutes off for 15 minutes   Recheck Feb 4th

## 2020-11-29 DIAGNOSIS — R3915 Urgency of urination: Secondary | ICD-10-CM | POA: Diagnosis not present

## 2020-11-29 DIAGNOSIS — N952 Postmenopausal atrophic vaginitis: Secondary | ICD-10-CM | POA: Diagnosis not present

## 2020-11-29 DIAGNOSIS — N3021 Other chronic cystitis with hematuria: Secondary | ICD-10-CM | POA: Diagnosis not present

## 2020-11-29 DIAGNOSIS — R8271 Bacteriuria: Secondary | ICD-10-CM | POA: Diagnosis not present

## 2020-11-30 ENCOUNTER — Other Ambulatory Visit: Payer: Self-pay

## 2020-11-30 ENCOUNTER — Ambulatory Visit (INDEPENDENT_AMBULATORY_CARE_PROVIDER_SITE_OTHER): Payer: Medicare PPO | Admitting: Family Medicine

## 2020-11-30 ENCOUNTER — Encounter: Payer: Self-pay | Admitting: Family Medicine

## 2020-11-30 VITALS — Ht 64.0 in | Wt 125.0 lb

## 2020-11-30 DIAGNOSIS — M542 Cervicalgia: Secondary | ICD-10-CM

## 2020-11-30 MED ORDER — PREDNISONE 10 MG PO TABS: ORAL_TABLET | ORAL | 0 refills | Status: DC

## 2020-11-30 NOTE — Progress Notes (Signed)
Office Visit Note   Patient: Patricia Peck           Date of Birth: 1946-05-24           MRN: [id number] Visit Date: 11/30/2020 Requested by: Flossie Buffy, NP [place],  [place] [address] PCP: Ronnald Nian, DO  Subjective: Chief Complaint  Patient presents with  . Neck - Pain    Patient has pain in neck and bilateral shoulders, right>left. Pain x a couple of weeks. No known injury. Sudden onset of pain and could not get comfortable. Was seen in ED ([facility]) and given shot. She was given a muscle relaxer and prednisone. Medications helped some.  Is having spasms. Denies pain down either arm. Denies numbness or tingling. Denies any painful or difficult range of motion.   . Right Shoulder - Pain    HPI: She is here with neck pain.  Symptoms started a couple weeks ago, no injury.  She was driving to [employer] with her husband and started feeling uncomfortable, her pain got worse to the point that she was taken to the emergency department at [facility] where she had x-rays which were read as multilevel severe spondylosis with no acute abnormality.  She was given an intramuscular injection with some improvement.  Her symptoms are better but have persisted, she went to see Dr. Georgina Snell recently.  Her primary concern is that she is scheduled to go on a trip to the [place] for 35 days, and will be leaving February 5.  She needs to make a decision before then whether she should go or not.  Denies any radicular symptoms.                ROS:   All other systems were reviewed and are negative.  Objective: Vital Signs: Ht 5\' 4"  (1.626 m)   Wt 125 lb (56.7 kg)   BMI 21.46 kg/m   Physical Exam:  General:  Alert and oriented, in no acute distress. Pulm:  Breathing unlabored. Psy:  Normal mood, congruent affect. Skin: No rash Neck: She has decreased range of motion looking upward.  Remainder of motion is symmetric.  She has some tenderness near the C7 spinous process  and in the paraspinous muscles.  Upper extremity strength and reflexes are normal.  Imaging: No results found.  Assessment & Plan: 1.  Cervical spondylosis -Discussed options and elected to try a prednisone taper as well as physical therapy.  If she is not better by mid next week, she will contact me and I will write a letter stating that she is unable to go on her trip.     Procedures: No procedures performed        PMFS History: Patient Active Problem List   Diagnosis Date Noted  . Left arm pain 11/22/2019  . Stenosis of left subclavian artery ([place]) 09/14/2019  . History of TIA (transient ischemic attack) 08/01/2019  . Anticoagulated 08/01/2019  . Hyperlipidemia   . Situational anxiety 05/23/2019  . IBS (irritable bowel syndrome) 04/11/2013  . PAF (paroxysmal atrial fibrillation) ([place]) 01/27/2013  . Cerebrovascular disease 01/27/2013  . Insomnia 11/02/2012  . GERD 03/12/2010  . Hypothyroidism 04/14/2007  . Depression 04/14/2007  . Essential hypertension 04/14/2007  . Osteoporosis 04/14/2007   Past Medical History:  Diagnosis Date  . Allergy   . Arthritis    hands  . Asthma   . Atrial fibrillation ([place])   . Atrial flutter ([place])   . Depression   .  Dysrhythmia    a-fib, on Xarelto  . GERD (gastroesophageal reflux disease)   . Hyperlipidemia   . Hypertension   . Hypothyroidism   . Osteoporosis   . TIA (transient ischemic attack)     Family History  Problem Relation Age of Onset  . Breast cancer Mother   . Drug abuse Son   . Depression Son   . Stroke Paternal Aunt   . Heart disease Paternal Grandfather   . Diabetes Maternal Grandfather     Past Surgical History:  Procedure Laterality Date  . ABDOMINAL HYSTERECTOMY    . COLONOSCOPY    . MYRINGOTOMY WITH TUBE PLACEMENT Right 01/14/2016   Procedure: RIGHT MYRINGOTOMY WITH T-TUBE PLACEMENT;  Surgeon: Leta Baptist, MD;  Location: [place];  Service: ENT;  Laterality: Right;  . PARTIAL  HYSTERECTOMY  1989  . TEE WITHOUT CARDIOVERSION N/A 12/22/2012   Procedure: TRANSESOPHAGEAL ECHOCARDIOGRAM (TEE);  Surgeon: Lelon Perla, MD;  Location: [facility] ENDOSCOPY;  Service: Cardiovascular;  Laterality: N/A;   Social History   Occupational History    Employer: RETIRED  Tobacco Use  . Smoking status: Never Smoker  . Smokeless tobacco: Never Used  Vaping Use  . Vaping Use: Never used  Substance and Sexual Activity  . Alcohol use: Yes    Comment: Rare  . Drug use: No  . Sexual activity: Yes

## 2020-12-01 ENCOUNTER — Other Ambulatory Visit: Payer: Self-pay | Admitting: Family Medicine

## 2020-12-03 NOTE — Telephone Encounter (Signed)
Last OV 11/15/20 Last fill 07/17/20  #180/1

## 2020-12-04 ENCOUNTER — Other Ambulatory Visit: Payer: Self-pay

## 2020-12-04 ENCOUNTER — Ambulatory Visit (INDEPENDENT_AMBULATORY_CARE_PROVIDER_SITE_OTHER): Payer: Medicare PPO | Admitting: Physical Therapy

## 2020-12-04 DIAGNOSIS — M542 Cervicalgia: Secondary | ICD-10-CM

## 2020-12-04 DIAGNOSIS — M6281 Muscle weakness (generalized): Secondary | ICD-10-CM | POA: Diagnosis not present

## 2020-12-04 NOTE — Therapy (Addendum)
[facility] Physical Therapy [address] [place], [place], [address] Phone: [phone]   Fax:  [phone]  Physical Therapy Evaluation  Patient Details  Name: Patricia Peck MRN: [id number] Date of Birth: Sep 14, 1946 Referring Provider (PT): Dr. Junius Roads   Encounter Date: 12/04/2020   PT End of Session - 12/04/20 1654    Visit Number 1    Number of Visits 8    Date for PT Re-Evaluation 02/01/21    Progress Note Due on Visit 10    PT Start Time [id number]    PT Stop Time 1346    PT Time Calculation (min) 38 min    Activity Tolerance Patient tolerated treatment well    Behavior During Therapy [facility] for tasks assessed/performed           Past Medical History:  Diagnosis Date  . Allergy   . Arthritis    hands  . Asthma   . Atrial fibrillation ([place])   . Atrial flutter ([place])   . Depression   . Dysrhythmia    a-fib, on Xarelto  . GERD (gastroesophageal reflux disease)   . Hyperlipidemia   . Hypertension   . Hypothyroidism   . Osteoporosis   . TIA (transient ischemic attack)     Past Surgical History:  Procedure Laterality Date  . ABDOMINAL HYSTERECTOMY    . COLONOSCOPY    . MYRINGOTOMY WITH TUBE PLACEMENT Right 01/14/2016   Procedure: RIGHT MYRINGOTOMY WITH T-TUBE PLACEMENT;  Surgeon: Leta Baptist, MD;  Location: [place];  Service: ENT;  Laterality: Right;  . PARTIAL HYSTERECTOMY  1989  . TEE WITHOUT CARDIOVERSION N/A 12/22/2012   Procedure: TRANSESOPHAGEAL ECHOCARDIOGRAM (TEE);  Surgeon: Lelon Perla, MD;  Location[facility] ENDOSCOPY;  Service: Cardiovascular;  Laterality: N/A;    There were no vitals filed for this visit.    Subjective Assessment - 12/04/20 1311    Subjective Pt arriving to therapy reporting 2 week onset of cervical pain with extends into her right shoulder. Pt reporting onset began after driving to [facility] a few weeks ago. Pt is leaving at the end of the month for a 35 day cruise and she must let the cruise line know by  12/08/2020 if she is going or not. Pt reporting pain today of 3/10 in her neck and bilaterla shoulders. Pt reporting her R is worse than her L shoulder.    Pertinent History a-fib, HTN, Osteoporosis, depression, stenosis subclavian artery, PAF, TIA    Limitations Sitting;House hold activities;Other (comment);Lifting    How long can you sit comfortably? "not long"    Patient Stated Goals Be able to go on my cruise with no pain    Currently in Pain? Yes    Pain Score 3     Pain Location Neck    Pain Orientation Right    Pain Descriptors / Indicators Aching;Sore    Pain Type Acute pain    Pain Onset 1 to 4 weeks ago    Pain Frequency Intermittent    Aggravating Factors  sitting prolonged    Pain Relieving Factors massage    Effect of Pain on Daily Activities difficulty with house hold chores an driving              [facility] PT Assessment - 12/04/20 0001      Assessment   Medical Diagnosis M54.2 neck pain    Referring Provider (PT) Dr. Junius Roads    Hand Dominance Right    Prior Therapy no  Precautions   Precautions None      Restrictions   Weight Bearing Restrictions No      Balance Screen   Has the patient fallen in the past 6 months No    Is the patient reluctant to leave their home because of a fear of falling?  No      Home Environment   Living Environment Private residence    Living Arrangements Spouse/significant other;Children    [place] to enter    Entrance Stairs-Number of Steps 3    Entrance Stairs-Rails Right      Prior Function   Level of Independence Independent    Vocation Retired    Leisure playing games, going on cruises      Cognition   Overall Cognitive Status Within Functional Limits for tasks assessed      Observation/Other Assessments   Focus on Therapeutic Outcomes (FOTO)  deferred due to pt is scheduled to go on a cruise at the end of month for 35 days      Posture/Postural Control   Posture/Postural Control Postural limitations     Postural Limitations Rounded Shoulders;Forward head      ROM / Strength   AROM / PROM / Strength Strength;AROM      AROM   AROM Assessment Site Shoulder;Cervical    Right/Left Shoulder Right;Left    Right Shoulder Extension 40 Degrees    Right Shoulder Flexion 154 Degrees    Right Shoulder External Rotation 68 Degrees    Left Shoulder Extension 40 Degrees    Left Shoulder Flexion 150 Degrees    Left Shoulder External Rotation 62 Degrees    Cervical Flexion 30    Cervical Extension 22    Cervical - Right Rotation 55    Cervical - Left Rotation 50      Strength   Strength Assessment Site Shoulder    Right/Left Shoulder Right;Left    Right Shoulder Flexion 4/5    Right Shoulder Extension 4/5    Right Shoulder Internal Rotation 4-/5    Right Shoulder External Rotation 4-/5    Left Shoulder Flexion 4/5    Left Shoulder Extension 4/5    Left Shoulder Internal Rotation 4/5    Left Shoulder External Rotation 4/5      Palpation   Palpation comment pt with active trigger points noted in R upper trap and levator, mild tenderness over supraspinatus tendon on R shoulder      Special Tests    Special Tests Rotator Cuff Impingement    Rotator Cuff Impingment tests Empty Can test      Empty Can test   Findings Negative      Ambulation/Gait   Gait Pattern Within Functional Limits                      Objective measurements completed on examination: See above findings.               PT Education - 12/04/20 1653    Education Details PT POC, HEP, basic anatomy and physiology    Person(s) Educated Patient    Methods Explanation;Demonstration;Handout;Verbal cues;Tactile cues    Comprehension Verbalized understanding;Returned demonstration;Need further instruction            PT Short Term Goals - 12/04/20 1655      PT SHORT TERM GOAL #1   Title Pt will be independent in her initial HEP.    Time 3    Period Weeks  Status New    Target Date 12/28/20              PT Long Term Goals - 12/04/20 1656      PT LONG TERM GOAL #1   Title Pt will improve her bilateral cervical rotation to >/= 65 degrees to improve driving function.    Time 8    Period Weeks    Status New    Target Date 02/01/21      PT LONG TERM GOAL #2   Title Pt will be able to report driving for more than 1 hour with pain </= 2/10.    Time 8    Period Weeks    Status New    Target Date 02/01/21      PT LONG TERM GOAL #3   Title Pt will be able to reach behind her head for ADL's/ bathing and dressing with no pain.    Time 8    Period Weeks    Status New    Target Date 02/01/21                  Plan - 12/04/20 1659    Clinical Impression Statement Pt arriving to therapy reporting cervical pain and bilateral anterior shoulder pain which is worse on the right. Pt weakness noted in bialteral UE's grossly 4/5 with mild weakness more on the R compared to L. Pt with limited cervical rotation bilaterally with pain noted more when rotating to the left side. Pt with dx of multiple level spondylosis. Pt was issued a HEP for cervial and shoulder stretching. Pt able to return demonstration during evaluation. Husband present during eval. Skilled PT needed to address pt's impairments with the interventions.    Personal Factors and Comorbidities Comorbidity 3+    Comorbidities A-fib, OA, HTN, depression, stenosis subclavian artery, PAF, TIA    Examination-Activity Limitations Lift;Carry;Dressing;Other;Sit    Examination-Participation Restrictions Other;Community Activity;Driving    Stability/Clinical Decision Making Stable/Uncomplicated    Clinical Decision Making Low    Rehab Potential Good    PT Frequency 1x / week    PT Duration 8 weeks    PT Treatment/Interventions ADLs/Self Care Home Management;Cryotherapy;Electrical Stimulation;Moist Heat;Ultrasound;Iontophoresis 4mg /ml Dexamethasone;Functional mobility training;Therapeutic activities;Patient/family  education;Neuromuscular re-education;Therapeutic exercise;Manual techniques;Passive range of motion;Dry needling;Taping    PT Next Visit Plan assess DN, STM to R upper trap, cervical ROM, shoulder ROM, strengthening    PT Home Exercise Plan Access Code: [email or web address]  URL: [email or web address]  Date: 12/04/2020  Prepared by: Kearney Hard    Exercises  Supine Chin Tuck - 2-3 x daily - 10 reps - 5 seconds hold  Supine Shoulder Flexion Extension AAROM with Dowel - 2-3 x daily - 2 sets - 10 reps  Seated Assisted Cervical Rotation with Towel - 2-3 x daily - 5 reps - 5 seconds hold  3 Finger Cervical Rotation - 2-3 x daily - 5 reps  Standing Isometric Cervical Extension with Manual Resistance - 2-3 x daily - 5 reps    Consulted and Agree with Plan of Care Patient           Patient will benefit from skilled therapeutic intervention in order to improve the following deficits and impairments:  Pain,Postural dysfunction,Decreased strength,Decreased range of motion,Decreased activity tolerance,Impaired UE functional use  Visit Diagnosis: Cervicalgia  Muscle weakness (generalized)     Problem List Patient Active Problem List   Diagnosis Date Noted  . Left arm pain 11/22/2019  . Stenosis of left subclavian artery ([place]) 09/14/2019  .  History of TIA (transient ischemic attack) 08/01/2019  . Anticoagulated 08/01/2019  . Hyperlipidemia   . Situational anxiety 05/23/2019  . IBS (irritable bowel syndrome) 04/11/2013  . PAF (paroxysmal atrial fibrillation) ([place]) 01/27/2013  . Cerebrovascular disease 01/27/2013  . Insomnia 11/02/2012  . GERD 03/12/2010  . Hypothyroidism 04/14/2007  . Depression 04/14/2007  . Essential hypertension 04/14/2007  . Osteoporosis 04/14/2007   Kearney Hard, PT, MPT 12/04/20 5:13 PM  Referring diagnosis? M54.2 Treatment diagnosis? (if different than referring diagnosis) M54.2, M62.81 What was this (referring dx) caused by? []  Surgery []  Fall [x]   Ongoing issue []  Arthritis []  Other: ____________  Laterality: []  Rt []  Lt [x]  Both  Check all possible CPT codes:      [x]  97110 (Therapeutic Exercise)  []  92507 (SLP Treatment)  [x]  97112 (Neuro Re-ed)   []  92526 (Swallowing Treatment)   []  47829 (Gait Training)   []  [id number] (Cognitive Training, 1st 15 minutes) [x]  97140 (Manual Therapy)   []  97130 (Cognitive Training, each add'l 15 minutes)  [x]  97530 (Therapeutic Activities)  []  Other, List CPT Code ____________    [x]  56213 (Self Care)       []  All codes above (97110 - 97535)  [x]  97012 (Mechanical Traction)  [x]  97014 (E-stim Unattended)  []  97032 (E-stim manual)  []  97033 (Ionto)  []  97035 (Ultrasound)  []  97760 (Orthotic Fit) [x]  [id number] (Physical Performance Training) []  [id number] (Aquatic Therapy) []  97034 (Contrast Bath) []  [id number] (Paraffin) []  97597 (Wound Care 1st 20 sq cm) []  97598 (Wound Care each add'l 20 sq cm) []  97016 (Vasopneumatic Device) []  [id number] (Orthotic Training) []  [id number] (Prosthetic Training)   Oretha Caprice ,PT, MPT 12/04/2020, 5:13 PM  [facility] Physical Therapy [address] [place], [place], [address] Phone: [phone]   Fax:  [phone]  Name: Patricia Peck MRN: [id number] Date of Birth: [birth year]

## 2020-12-04 NOTE — Patient Instructions (Signed)
Access Code: [email or web address] URL: [email or web address] Date: 12/04/2020 Prepared by: Kearney Hard  Exercises Supine Chin Tuck - 2-3 x daily - 10 reps - 5 seconds hold Supine Shoulder Flexion Extension AAROM with Dowel - 2-3 x daily - 2 sets - 10 reps Seated Assisted Cervical Rotation with Towel - 2-3 x daily - 5 reps - 5 seconds hold 3 Finger Cervical Rotation - 2-3 x daily - 5 reps Standing Isometric Cervical Extension with Manual Resistance - 2-3 x daily - 5 reps

## 2020-12-06 NOTE — Progress Notes (Signed)
   I, Peterson Lombard, LAT, ATC acting as a scribe for [name], MD.  Patricia Peck is a 75 y.o. female who presents to [place] at [facility] today for cervical paraspinal muscle spasm. Pt was last seen by Dr. Georgina Snell on 11/27/20 and was advised to try heating pad, TENS unit, limited tizanidine, and was referred to PT of which she's completed 1 visit. Following this visit, pt was also seen by Dr. Eunice Blase at [facility] on 11/30/20 and was given prednisone and referred to PT. Today, pt reports that her neck pain is the same.  She hasn't been able to take the Tizanidine regularly due to heartburn.  She is also taking prednisone that was prescribed by Dr. Junius Roads but this also gives her heartburn.  She has more PT sessions scheduled but has only completed one to date.  She has not done much of her HEP yet.  Additionally this visit was scheduled to help make a decision about attending a long greater than 30-day cruise that is scheduled to start at the end of February.  The decision to going the cruise need to be made by today.  Unfortunately Jan has decided that she cannot go on the cruise because of her neck and her and her husband's health and the continued Covid outbreak.  She notes that she does not need a doctor's note regarding the the cruise..   Treatments tried: creams, hot pads, hydrocondone-acetaminophen, methocarbamol, methylprednisone, tizanidine  Pertinent review of systems: No fevers or chills  Relevant historical information: Hypertension   Exam:  BP (!) 148/98 (BP Location: Right Arm, Patient Position: Sitting, Cuff Size: Normal)   Pulse 92   Ht 5\' 4"  (1.626 m)   Wt 130 lb 12.8 oz (59.3 kg)   SpO2 98%   BMI 22.45 kg/m  General: Well Developed, well nourished, and in no acute distress.   MSK: Cpine:  Normal-appearing Decreased motion. Upper extremity strength is intact.        Assessment and Plan: 75 y.o. female with neck muscles spasm and  dysfunction.  Should do well with physical therapy.  We will add massage therapy as well.  Medications have not been helpful.  Less of a time pressure because the cruise is being canceled.  If not improving after 6 weeks of physical therapy would consider MRI.  Check back as needed.Marland Kitchen   PDMP not reviewed this encounter. No orders of the defined types were placed in this encounter.  Meds ordered this encounter  Medications  . AMBULATORY NON FORMULARY MEDICATION    Sig: Massage therapy Neck pain.    Dispense:  1 each    Refill:  0     Discussed warning signs or symptoms. Please see discharge instructions. Patient expresses understanding.   The above documentation has been reviewed and is accurate and complete [name], M.D.  Total encounter time 20 minutes including face-to-face time with the patient and, reviewing past medical record, and charting on the date of service.   Discussed treatment plan and options and backup plan.

## 2020-12-07 ENCOUNTER — Ambulatory Visit (HOSPITAL_COMMUNITY): Payer: Medicare PPO | Attending: Internal Medicine

## 2020-12-07 ENCOUNTER — Ambulatory Visit (INDEPENDENT_AMBULATORY_CARE_PROVIDER_SITE_OTHER): Payer: Medicare PPO | Admitting: Family Medicine

## 2020-12-07 ENCOUNTER — Other Ambulatory Visit: Payer: Self-pay

## 2020-12-07 ENCOUNTER — Encounter: Payer: Self-pay | Admitting: Family Medicine

## 2020-12-07 VITALS — BP 148/98 | HR 92 | Ht 64.0 in | Wt 130.8 lb

## 2020-12-07 DIAGNOSIS — M62838 Other muscle spasm: Secondary | ICD-10-CM | POA: Diagnosis not present

## 2020-12-07 DIAGNOSIS — I059 Rheumatic mitral valve disease, unspecified: Secondary | ICD-10-CM | POA: Insufficient documentation

## 2020-12-07 LAB — ECHOCARDIOGRAM COMPLETE: S' Lateral: 2.7 cm

## 2020-12-07 MED ORDER — AMBULATORY NON FORMULARY MEDICATION: 0 refills | Status: DC

## 2020-12-07 NOTE — Patient Instructions (Addendum)
Thank you for coming in today.  Plan for PT.   OK to consider massage therapy.   If not better after about 6 weeks we can do an MRI.   I am sorry about the Cruise but I think it was the correct decision medically.

## 2020-12-13 ENCOUNTER — Other Ambulatory Visit: Payer: Self-pay

## 2020-12-13 ENCOUNTER — Ambulatory Visit (INDEPENDENT_AMBULATORY_CARE_PROVIDER_SITE_OTHER): Payer: Medicare PPO | Admitting: Rehabilitative and Restorative Service Providers"

## 2020-12-13 ENCOUNTER — Encounter: Payer: Self-pay | Admitting: Rehabilitative and Restorative Service Providers"

## 2020-12-13 DIAGNOSIS — R293 Abnormal posture: Secondary | ICD-10-CM | POA: Diagnosis not present

## 2020-12-13 DIAGNOSIS — M542 Cervicalgia: Secondary | ICD-10-CM | POA: Diagnosis not present

## 2020-12-13 DIAGNOSIS — M6281 Muscle weakness (generalized): Secondary | ICD-10-CM | POA: Diagnosis not present

## 2020-12-13 NOTE — Patient Instructions (Signed)
Access Code: [email or web address] URL: [email or web address] Date: 12/13/2020 Prepared by: Vista Mink  Exercises Standing Scapular Retraction - 5 x daily - 7 x weekly - 1 sets - 5 reps - 5 second hold Seated Cervical Rotation AROM - 2 x daily - 7 x weekly - 1 sets - 10 reps - 5 seconds hold Standing Isometric Cervical Extension with Manual Resistance - 3-5 x daily - 7 x weekly - 1 sets - 5 reps - 5 hold

## 2020-12-13 NOTE — Therapy (Signed)
[facility] [address] [place], [place], [address] Phone: [phone]   Fax:  [phone]  Physical Therapy Treatment  Patient Details  Name: Patricia Peck MRN: [id number] Date of Birth: 1945/12/16 Referring Provider (PT): Dr. Junius Roads   Encounter Date: 12/13/2020   PT End of Session - 12/13/20 1628    Visit Number 2    Number of Visits 8    Date for PT Re-Evaluation 02/01/21    Progress Note Due on Visit 10    PT Start Time 1430    PT Stop Time 1510    PT Time Calculation (min) 40 min    Activity Tolerance Patient tolerated treatment well;No increased pain    Behavior During Therapy WFL for tasks assessed/performed           Past Medical History:  Diagnosis Date  . Allergy   . Arthritis    hands  . Asthma   . Atrial fibrillation ([place])   . Atrial flutter ([place])   . Depression   . Dysrhythmia    a-fib, on Xarelto  . GERD (gastroesophageal reflux disease)   . Hyperlipidemia   . Hypertension   . Hypothyroidism   . Osteoporosis   . TIA (transient ischemic attack)     Past Surgical History:  Procedure Laterality Date  . ABDOMINAL HYSTERECTOMY    . COLONOSCOPY    . MYRINGOTOMY WITH TUBE PLACEMENT Right 01/14/2016   Procedure: RIGHT MYRINGOTOMY WITH T-TUBE PLACEMENT;  Surgeon: Leta Baptist, MD;  Location: [facility];  Service: ENT;  Laterality: Right;  . PARTIAL HYSTERECTOMY  1989  . TEE WITHOUT CARDIOVERSION N/A 12/22/2012   Procedure: TRANSESOPHAGEAL ECHOCARDIOGRAM (TEE);  Surgeon: Lelon Perla, MD;  Location: [facility] ENDOSCOPY;  Service: Cardiovascular;  Laterality: N/A;    There were no vitals filed for this visit.   Subjective Assessment - 12/13/20 1623    Subjective Patricia Peck reports some early progress with her physical therapy.  She did decide to cancel her cruise, although Covid was as much to blame as her neck.    Patient is accompained by: Family member    Pertinent History a-fib, HTN, Osteoporosis, depression,  stenosis subclavian artery, PAF, TIA    Limitations Sitting;House hold activities;Other (comment);Lifting    How long can you sit comfortably? "not long"    Patient Stated Goals Be able to go on my cruise with no pain    Currently in Pain? Yes    Pain Score 3     Pain Location Neck    Pain Orientation Lower    Pain Descriptors / Indicators Aching;Sore;Tightness    Pain Type Acute pain    Pain Onset 1 to 4 weeks ago    Pain Frequency Intermittent    Aggravating Factors  Prolonged sitting and flexed postures    Pain Relieving Factors Massage    Effect of Pain on Daily Activities Pain with driving, sitting and reading    Multiple Pain Sites No                             [facility] Adult PT Treatment/Exercise - 12/13/20 0001      Posture/Postural Control   Posture/Postural Control Postural limitations    Postural Limitations Rounded Shoulders;Forward head      Therapeutic Activites    Therapeutic Activities ADL's    ADL's Education regarding posture, avoiding rounded shoulder, head forward, decreased lordosis and flexed postures  Exercises   Exercises Neck      Neck Exercises: Theraband   Scapula Retraction 20 reps;Blue;Other (comment)    Scapula Retraction Limitations 3 seconds limited range (avoid scapular protraction)    Shoulder External Rotation 10 reps;Limitations    Shoulder External Rotation Limitations Yellow in comfortable range with slow eccentrics      Neck Exercises: Standing   Other Standing Exercises Shoulder blades pinch 2 sets of 10 for 5 seconds    Other Standing Exercises Cervical rotation AROM 2 sets of 10 for 5 seconds      Neck Exercises: Seated   Cervical Isometrics Extension;5 secs;10 reps;Limitations    Cervical Isometrics Limitations 2 sets      Neck Exercises: Supine   Other Supine Exercise Attempted supine shoulder flexion with dowel.  Held due to impingement type symptoms.  Chose to focus on scapular retraction instead.                   PT Education - 12/13/20 1627    Education Details Discussed early eaxercises and progression.  Discussed the importance of avoiding flexed postures and how to avoid them with postural modifications.  Progressed HEP.    Person(s) Educated Patient    Methods Explanation;Demonstration;Tactile cues;Verbal cues;Handout    Comprehension Verbal cues required;Need further instruction;Verbalized understanding;Tactile cues required;Returned demonstration            PT Short Term Goals - 12/13/20 1627      PT SHORT TERM GOAL #1   Title Pt will be independent in her initial HEP.    Time 3    Period Weeks    Status On-going    Target Date 12/28/20             PT Long Term Goals - 12/13/20 1628      PT LONG TERM GOAL #1   Title Pt will improve her bilateral cervical rotation to >/= 65 degrees to improve driving function.    Time 8    Period Weeks    Status On-going      PT LONG TERM GOAL #2   Title Pt will be able to report driving for more than 1 hour with pain </= 2/10.    Time 8    Period Weeks    Status On-going      PT LONG TERM GOAL #3   Title Pt will be able to reach behind her head for ADL's/ bathing and dressing with no pain.    Time 8    Period Weeks    Status On-going                 Plan - 12/13/20 1628    Clinical Impression Statement Patricia Peck has very weak scapular retractors, posterior RTC and cervical extensor muscles.  Posturally, when she is paying attention, she looks and feels good.  When fatigued (prolonged postures) or distracted, posture slips and pain increases.  PT will focus on postural strengthening and awareness to meet LTGs.  Patricia Peck's prognosis to meet LTGs is good with the recommended course of treatment.    Personal Factors and Comorbidities Comorbidity 3+    Comorbidities A-fib, OA, HTN, depression, stenosis subclavian artery, PAF, TIA    Examination-Activity Limitations Lift;Carry;Dressing;Other;Sit     Examination-Participation Restrictions Other;Community Activity;Driving    Stability/Clinical Decision Making Stable/Uncomplicated    Rehab Potential Good    PT Frequency 1x / week    PT Duration 8 weeks    PT Treatment/Interventions ADLs/Self Care Home  Management;Cryotherapy;Electrical Stimulation;Moist Heat;Ultrasound;Iontophoresis 4mg /ml Dexamethasone;Functional mobility training;Therapeutic activities;Patient/family education;Neuromuscular re-education;Therapeutic exercise;Manual techniques;Passive range of motion;Dry needling;Taping    PT Next Visit Plan assess DN, STM to R upper trap, cervical ROM, shoulder ROM, strengthening    PT Home Exercise Plan Access Code: [email or web address]    [email or web address] and Agree with Plan of Care Patient           Patient will benefit from skilled therapeutic intervention in order to improve the following deficits and impairments:  Pain,Postural dysfunction,Decreased strength,Decreased range of motion,Decreased activity tolerance,Impaired UE functional use  Visit Diagnosis: Cervicalgia  Muscle weakness (generalized)  Abnormal posture     Problem List Patient Active Problem List   Diagnosis Date Noted  . Left arm pain 11/22/2019  . 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 History of TIA (transient ischemic attack) 08/01/2019  . Anticoagulated 08/01/2019  . Hyperlipidemia   . Situational anxiety 05/23/2019  . IBS (irritable bowel syndrome) 04/11/2013  . PAF (paroxysmal atrial fibrillation) ([place]) 01/27/2013  . Cerebrovascular disease 01/27/2013  . Insomnia 11/02/2012  . GERD 03/12/2010  . Hypothyroidism 04/14/2007  . Depression 04/14/2007  . Essential hypertension 04/14/2007  . Osteoporosis 04/14/2007    Farley Ly PT, MPT 12/13/2020, 4:31 PM  [facility] [address] [place], [place], [address] Phone: [phone]   Fax:  [phone]  Name: Patricia Peck MRN: [id number] Date of Birth:  Dec 30, 1945

## 2020-12-17 ENCOUNTER — Encounter: Payer: Medicare PPO | Admitting: Physical Therapy

## 2020-12-19 ENCOUNTER — Telehealth (INDEPENDENT_AMBULATORY_CARE_PROVIDER_SITE_OTHER): Payer: Medicare PPO | Admitting: Nurse Practitioner

## 2020-12-19 ENCOUNTER — Encounter: Payer: Self-pay | Admitting: Nurse Practitioner

## 2020-12-19 VITALS — Temp 97.6°F | Ht 64.0 in | Wt 125.0 lb

## 2020-12-19 DIAGNOSIS — K529 Noninfective gastroenteritis and colitis, unspecified: Secondary | ICD-10-CM

## 2020-12-19 MED ORDER — PROMETHAZINE HCL 25 MG RE SUPP: 25.0000 mg | Freq: Three times a day (TID) | RECTAL | 0 refills | Status: DC | PRN

## 2020-12-19 MED ORDER — ONDANSETRON 4 MG PO TBDP: 4.0000 mg | ORAL_TABLET | Freq: Three times a day (TID) | ORAL | 0 refills | Status: DC | PRN

## 2020-12-19 NOTE — Patient Instructions (Addendum)
Hold bumex, atorvastatin, xyzal, and zetia till symptoms improve. Maintain adequate oral hydration with water and gartorade/powerade. Follow brat diet x 2daysm then advance as tolerated. Use zofran sublingual for nausea. Use peptobismol or imodium for diarrhea. Call office if no improvement in 2days. Go to ED if worsening symptoms or blood in stool or unable to maintain adequate oral hydration  Viral Gastroenteritis, Adult  Viral gastroenteritis is also known as the stomach flu. This condition may affect your stomach, small intestine, and large intestine. It can cause sudden watery diarrhea, fever, and vomiting. This condition is caused by many different viruses. These viruses can be passed from person to person very easily (are contagious). Diarrhea and vomiting can make you feel weak and cause you to become dehydrated. You may not be able to keep fluids down. Dehydration can make you tired and thirsty, cause you to have a dry mouth, and decrease how often you urinate. It is important to replace the fluids that you lose from diarrhea and vomiting. What are the causes? Gastroenteritis is caused by many viruses, including rotavirus and norovirus. Norovirus is the most common cause in adults. You can get sick after being exposed to the viruses from other people. You can also get sick by:  Eating food, drinking water, or touching a surface contaminated with one of these viruses.  Sharing utensils or other personal items with an infected person. What increases the risk? You are more likely to develop this condition if you:  Have a weak body defense system (immune system).  Live with one or more children who are younger than 71 years old.  Live in a nursing home.  Travel on cruise ships. What are the signs or symptoms? Symptoms of this condition start suddenly 1-3 days after exposure to a virus. Symptoms may last for a few days or for as long as a week. Common symptoms include watery diarrhea  and vomiting. Other symptoms include:  Fever.  Headache.  Fatigue.  Pain in the abdomen.  Chills.  Weakness.  Nausea.  Muscle aches.  Loss of appetite. How is this diagnosed? This condition is diagnosed with a medical history and physical exam. You may also have a stool test to check for viruses or other infections. How is this treated? This condition typically goes away on its own. The focus of treatment is to prevent dehydration and restore lost fluids (rehydration). This condition may be treated with:  An oral rehydration solution (ORS) to replace important salts and minerals (electrolytes) in your body. Take this if told by your health care provider. This is a drink that is sold at pharmacies and retail stores.  Medicines to help with your symptoms.  Probiotic supplements to reduce symptoms of diarrhea.  Fluids given through an IV, if dehydration is severe. Older adults and people with other diseases or a weak immune system are at higher risk for dehydration. Follow these instructions at home: Eating and drinking  Take an ORS as told by your health care provider.  Drink clear fluids in small amounts as you are able. Clear fluids include: ? Water. ? Ice chips. ? Diluted fruit juice. ? Low-calorie sports drinks.  Drink enough fluid to keep your urine pale yellow.  Eat small amounts of healthy foods every 3-4 hours as you are able. This may include whole grains, fruits, vegetables, lean meats, and yogurt.  Avoid fluids that contain a lot of sugar or caffeine, such as energy drinks, sports drinks, and soda.  Avoid spicy or  fatty foods.  Avoid alcohol.   General instructions  Wash your hands often, especially after having diarrhea or vomiting. If soap and water are not available, use hand sanitizer.  Make sure that all people in your household wash their hands well and often.  Take over-the-counter and prescription medicines only as told by your health care  provider.  Rest at home while you recover.  Watch your condition for any changes.  Take a warm bath to relieve any burning or pain from frequent diarrhea episodes.  Keep all follow-up visits as told by your health care provider. This is important.   Contact a health care provider if you:  Cannot keep fluids down.  Have symptoms that get worse.  Have new symptoms.  Feel light-headed or dizzy.  Have muscle cramps. Get help right away if you:  Have chest pain.  Feel extremely weak or you faint.  See blood in your vomit.  Have vomit that looks like coffee grounds.  Have bloody or black stools or stools that look like tar.  Have a severe headache, a stiff neck, or both.  Have a rash.  Have severe pain, cramping, or bloating in your abdomen.  Have trouble breathing or you are breathing very quickly.  Have a fast heartbeat.  Have skin that feels cold and clammy.  Feel confused.  Have pain when you urinate.  Have signs of dehydration, such as: ? Dark urine, very little urine, or no urine. ? Cracked lips. ? Dry mouth. ? Sunken eyes. ? Sleepiness. ? Weakness. Summary  Viral gastroenteritis is also known as the stomach flu. It can cause sudden watery diarrhea, fever, and vomiting.  This condition can be passed from person to person very easily (is contagious).  Take an ORS if told by your health care provider. This is a drink that is sold at pharmacies and retail stores.  Wash your hands often, especially after having diarrhea or vomiting. If soap and water are not available, use hand sanitizer. This information is not intended to replace advice given to you by your health care provider. Make sure you discuss any questions you have with your health care provider. Document Revised: 04/08/2019 Document Reviewed: 08/25/2018 Elsevier Patient Education  2021 [place] Choices to Help Relieve Diarrhea, Adult Diarrhea can make you feel weak and cause  you to become dehydrated. It is important to choose the right foods and drinks to:  Relieve diarrhea.  Replace lost fluids and nutrients.  Prevent dehydration. What are tips for following this plan? Relieving diarrhea  Avoid foods that make your diarrhea worse. These may include: ? Foods and beverages sweetened with high-fructose corn syrup, honey, or sweeteners such as xylitol, sorbitol, and mannitol. ? Fried, greasy, or spicy foods. ? Raw fruits and vegetables.  Eat foods that are rich in probiotics. These include foods such as yogurt and fermented milk products. Probiotics can help increase healthy bacteria in your stomach and intestines (gastrointestinal tract or GI tract). This may help digestion and stop diarrhea.  If you have lactose intolerance, avoid dairy products. These may make your diarrhea worse.  Take medicine to help stop diarrhea only as told by your health care provider. Replacing nutrients  Eat bland, easy-to-digest foods in small amounts as you are able, until your diarrhea starts to get better. These foods include bananas, applesauce, rice, toast, and crackers.  Gradually reintroduce nutrient-rich foods as tolerated or as told by your health care provider. This includes: ? Well-cooked protein  foods, such as eggs, lean meats like fish or chicken without skin, and tofu. ? Peeled, seeded, and soft-cooked fruits and vegetables. ? Low-fat dairy products. ? Whole grains.  Take vitamin and mineral supplements as told by your health care provider.   Preventing dehydration  Start by sipping water or a solution to prevent dehydration (oral rehydration solution, ORS). This is a drink that helps replace fluids and minerals your body has lost. You can buy an ORS at pharmacies and retail stores.  Try to drink at least 8-10 cups (2,000-2,500 mL) of fluid each day to help replace lost fluids. If you have urine that is pale yellow, you are getting enough fluids.  You may  drink other liquids in addition to water, such as fruit juice that you have added water to (diluted fruit juice) or low-calorie sports drinks, as tolerated or as told by your health care provider.  Avoid drinks with caffeine, such as coffee, tea, or soft drinks.  Avoid alcohol.   Summary  When you have diarrhea, it is important to choose the right foods and drinks to relieve diarrhea, to replace lost fluids and nutrients, and to prevent dehydration.  Make sure you drink enough fluid to keep your urine pale yellow.  You may benefit from eating bland foods at first. Gradually reintroduce healthy, nutrient-rich foods as tolerated or as told by your health care provider.  Avoid foods that make your diarrhea worse, such as fried, greasy, or spicy foods. This information is not intended to replace advice given to you by your health care provider. Make sure you discuss any questions you have with your health care provider. Document Revised: 12/06/2019 Document Reviewed: 12/06/2019 Elsevier Patient Education  2021 [employer].

## 2020-12-19 NOTE — Progress Notes (Signed)
Virtual Visit via Telephone Note  I connected with@ on 12/19/20 at  1:00 PM EST by a telephone enabled telemedicine application and verified that I am speaking with the correct person using two identifiers. Interactive audio and video telecommunications were attempted between this provider and patient, however failed, due to patient having technical difficulties OR patient did not have access to video capability.  We continued and completed visit with audio only.   Location: Patient:Home Provider: Office Participants: patient, husband and provider   I discussed the limitations of evaluation and management by telemedicine and the availability of in person appointments. I also discussed with the patient that there may be a patient responsible charge related to this service. The patient expressed understanding and agreed to proceed.  CC:N/V/D x 4days  History of Present Illness: GI Problem The primary symptoms include weight loss, fatigue, nausea, vomiting and diarrhea. Primary symptoms do not include fever, abdominal pain, melena, hematemesis, jaundice, hematochezia, dysuria, myalgias, arthralgias or rash. The illness began 3 to 5 days ago. The onset was gradual. The problem has not changed since onset. The vomiting began more than 2 days ago. The emesis contains stomach contents.  The diarrhea began 3 to 5 days ago. The diarrhea is watery. The diarrhea occurs 2 to 4 times per day.  The illness is also significant for anorexia. The illness does not include chills, dysphagia, odynophagia, bloating, constipation, tenesmus, back pain or itching. Significant associated medical issues include GERD. Associated medical issues do not include inflammatory bowel disease, gallstones, liver disease, alcohol abuse, gastric bypass, bowel resection, irritable bowel syndrome, hemorrhoids or diverticulitis.   Wt Readings from Last 3 Encounters:  12/19/20 125 lb (56.7 kg)  12/07/20 130 lb 12.8 oz (59.3 kg)   11/30/20 125 lb (56.7 kg)   Observations/Objective: Alert and oriented x 4 Normal speech  Assessment and Plan: [place] was seen today for acute visit.  Diagnoses and all orders for this visit:  Gastroenteritis -     Discontinue: promethazine (PHENERGAN) 25 MG suppository; Place 1 suppository (25 mg total) rectally every 8 (eight) hours as needed for nausea or vomiting. -     ondansetron (ZOFRAN ODT) 4 MG disintegrating tablet; Take 1-2 tablets (4-8 mg total) by mouth every 8 (eight) hours as needed for nausea or vomiting.   Follow Up Instructions: Hold bumex, atorvastatin, xyzal, and zetia till symptoms improve. Maintain adequate oral hydration with water and gartorade/powerade. Follow brat diet x 2daysm then advance as tolerated. Use zofran sublingual for nausea. Use peptobismol or imodium for diarrhea. Call office if no improvement in 2days. Go to ED if worsening symptoms or blood in stool or unable to maintain adequate oral hydration.   I discussed the assessment and treatment plan with the patient. The patient was provided an opportunity to ask questions and all were answered. The patient agreed with the plan and demonstrated an understanding of the instructions.   The patient was advised to call back or seek an in-person evaluation if the symptoms worsen or if the condition fails to improve as anticipated.  I provided 12 minutes of non-face-to-face time during this encounter.  Wilfred Lacy, NP

## 2020-12-21 ENCOUNTER — Telehealth: Payer: Self-pay | Admitting: Family Medicine

## 2020-12-21 NOTE — Telephone Encounter (Signed)
Tried 3 times to call and phone rings then gives a busy signal.  Will try later. Dm/cma

## 2020-12-21 NOTE — Telephone Encounter (Signed)
Patient notified [facility] phone.  No further questions and will cal back if any other issues.  Dm/cma

## 2020-12-21 NOTE — Telephone Encounter (Signed)
pepto-bismol is better for nausea but not great for diarrhea. As long as no fever and no blood in stool, I would recommend imodium 2 capsules after next episode of diarrhea and then 1 cap after the next BM if still diarrhea. I would try to eat some bland crackers or buttered toast or something bland that is not water/liquid.

## 2020-12-21 NOTE — Telephone Encounter (Signed)
Patient is calling to speak to the nurse. She was seen for a VV on 02/16, but states that her diarrhea is not getting any better. She wants to know what she should do. Please call her at [phone] and advise.

## 2020-12-21 NOTE — Telephone Encounter (Signed)
Spoke to patient and she states that the nausea is gone but she still has diarrhea and has used a whole bottle of pepto-bismol. She has only bee able to eat popsicles and drink water and ginger ale.   No blood in stool.  Please advise.  Thanks.  Dm/cma

## 2020-12-24 ENCOUNTER — Ambulatory Visit (INDEPENDENT_AMBULATORY_CARE_PROVIDER_SITE_OTHER): Payer: Medicare PPO | Admitting: Physical Therapy

## 2020-12-24 ENCOUNTER — Other Ambulatory Visit: Payer: Self-pay

## 2020-12-24 DIAGNOSIS — M542 Cervicalgia: Secondary | ICD-10-CM | POA: Diagnosis not present

## 2020-12-24 DIAGNOSIS — M6281 Muscle weakness (generalized): Secondary | ICD-10-CM

## 2020-12-24 DIAGNOSIS — R293 Abnormal posture: Secondary | ICD-10-CM | POA: Diagnosis not present

## 2020-12-24 NOTE — Therapy (Signed)
[facility] Physical Therapy [address] [place], [place], [address] Phone: [phone]   Fax:  [phone]  Physical Therapy Treatment  Patient Details  Name: Patricia Peck MRN: [id number] Date of Birth: 1946/08/16 Referring Provider (PT): Dr. Junius Roads   Encounter Date: 12/24/2020   PT End of Session - 12/24/20 1321    Visit Number 3    Number of Visits 8    Date for PT Re-Evaluation 02/01/21    Progress Note Due on Visit 10    PT Start Time 1497    PT Stop Time 1345    PT Time Calculation (min) 38 min    Activity Tolerance Patient tolerated treatment well;No increased pain    Behavior During Therapy WFL for tasks assessed/performed           Past Medical History:  Diagnosis Date  . Allergy   . Arthritis    hands  . Asthma   . Atrial fibrillation ([place])   . Atrial flutter ([place])   . Depression   . Dysrhythmia    a-fib, on Xarelto  . GERD (gastroesophageal reflux disease)   . Hyperlipidemia   . Hypertension   . Hypothyroidism   . Osteoporosis   . TIA (transient ischemic attack)     Past Surgical History:  Procedure Laterality Date  . ABDOMINAL HYSTERECTOMY    . COLONOSCOPY    . MYRINGOTOMY WITH TUBE PLACEMENT Right 01/14/2016   Procedure: RIGHT MYRINGOTOMY WITH T-TUBE PLACEMENT;  Surgeon: Leta Baptist, MD;  Location: [place];  Service: ENT;  Laterality: Right;  . PARTIAL HYSTERECTOMY  1989  . TEE WITHOUT CARDIOVERSION N/A 12/22/2012   Procedure: TRANSESOPHAGEAL ECHOCARDIOGRAM (TEE);  Surgeon: Lelon Perla, MD;  Location: [facility] ENDOSCOPY;  Service: Cardiovascular;  Laterality: N/A;    There were no vitals filed for this visit.   Subjective Assessment - 12/24/20 1315    Subjective Pt arriving today reporting 1 week of nausea and vomiting and she wasn't able tot make her appointments last week. Pt reporting unable to perform her HEP since her last visit due sickness.    Pertinent History a-fib, HTN, Osteoporosis, depression,  stenosis subclavian artery, PAF, TIA    Limitations Sitting;House hold activities;Other (comment);Lifting    How long can you sit comfortably? "not long"    Patient Stated Goals Be able to go on my cruise with no pain    Currently in Pain? Yes    Pain Score 5     Pain Location Neck    Pain Orientation Lower    Pain Descriptors / Indicators Aching;Sore    Pain Type Acute pain    Pain Onset More than a month ago    Pain Frequency Intermittent                             [facility] Adult PT Treatment/Exercise - 12/24/20 0001      Exercises   Exercises Neck      Neck Exercises: Standing   Other Standing Exercises scapular retraction 2x10 holding 3-4 seconds each    Other Standing Exercises cervical rotation x 5 holding 5 seconds each      Neck Exercises: Supine   Neck Retraction 10 reps;5 secs    Other Supine Exercise shoulder flexion with 1# bar, no impingement symptoms reported or noticied this session ofther than shoulder tightness from stretch   2x10   Other Supine Exercise serratus punches with 1# bar 2x10  Neck Exercises: Stretches   Upper Trapezius Stretch Right;Left;3 reps;10 seconds    Levator Stretch 2 reps;10 seconds    Corner Stretch 3 reps;10 seconds                  PT Education - 12/24/20 1320    Education Details exercise technique    Person(s) Educated Patient    Methods Explanation;Demonstration    Comprehension Verbalized understanding;Returned demonstration            PT Short Term Goals - 12/24/20 1406      PT SHORT TERM GOAL #1   Title Pt will be independent in her initial HEP.    Status On-going             PT Long Term Goals - 12/13/20 1628      PT LONG TERM GOAL #1   Title Pt will improve her bilateral cervical rotation to >/= 65 degrees to improve driving function.    Time 8    Period Weeks    Status On-going      PT LONG TERM GOAL #2   Title Pt will be able to report driving for more than 1 hour with  pain </= 2/10.    Time 8    Period Weeks    Status On-going      PT LONG TERM GOAL #3   Title Pt will be able to reach behind her head for ADL's/ bathing and dressing with no pain.    Time 8    Period Weeks    Status On-going                 Plan - 12/24/20 1330    Clinical Impression Statement Pt presenting today with 5/10 pain in her R cervical spine and in R upper trap. Pt reported non-compliance with her HEP since her last visit due to N/V sickness. Pt tolerating gentle strengtheing and stretching exercises today due to mild fatigue from sickness. Pt still presenting with weakness in her bilateral shoulder mucsculature and pain still present in R upper trap. I discussed continuation of initial HEP along with the new exercises that Rob gave her at her last treatment session.  Pt reporting less pain at end of session.    Personal Factors and Comorbidities Comorbidity 3+    Comorbidities A-fib, OA, HTN, depression, stenosis subclavian artery, PAF, TIA    Examination-Activity Limitations Lift;Carry;Dressing;Other;Sit    Examination-Participation Restrictions Other;Community Activity;Driving    Stability/Clinical Decision Making Stable/Uncomplicated    Clinical Decision Making Low    Rehab Potential Good    PT Frequency 1x / week    PT Duration 8 weeks    PT Treatment/Interventions ADLs/Self Care Home Management;Cryotherapy;Electrical Stimulation;Moist Heat;Ultrasound;Iontophoresis 4mg /ml Dexamethasone;Functional mobility training;Therapeutic activities;Patient/family education;Neuromuscular re-education;Therapeutic exercise;Manual techniques;Passive range of motion;Dry needling;Taping    PT Next Visit Plan assess DN, STM to R upper trap, cervical ROM, shoulder ROM, strengthening    PT Home Exercise Plan Access Code: [email or web address]    Consulted and Agree with Plan of Care Patient           Patient will benefit from skilled therapeutic intervention in order to improve the  following deficits and impairments:  Pain,Postural dysfunction,Decreased strength,Decreased range of motion,Decreased activity tolerance,Impaired UE functional use  Visit Diagnosis: Cervicalgia  Muscle weakness (generalized)  Abnormal posture     Problem List Patient Active Problem List   Diagnosis Date Noted  . Left arm pain 11/22/2019  . Stenosis of left subclavian artery (  [place]) 09/14/2019  . History of TIA (transient ischemic attack) 08/01/2019  . Anticoagulated 08/01/2019  . Hyperlipidemia   . Situational anxiety 05/23/2019  . IBS (irritable bowel syndrome) 04/11/2013  . PAF (paroxysmal atrial fibrillation) ([place]) 01/27/2013  . Cerebrovascular disease 01/27/2013  . Insomnia 11/02/2012  . GERD 03/12/2010  . Hypothyroidism 04/14/2007  . Depression 04/14/2007  . Essential hypertension 04/14/2007  . Osteoporosis 04/14/2007    Patricia Peck PT, MPT 12/24/2020, 2:07 PM  [facility] Physical Therapy [address] [place], [place], [address] Phone: [phone]   Fax:  [phone]  Name: Patricia Peck MRN: [id number] Date of Birth: April 08, 1946

## 2020-12-31 ENCOUNTER — Encounter: Payer: Self-pay | Admitting: Physical Therapy

## 2020-12-31 ENCOUNTER — Telehealth: Payer: Self-pay | Admitting: Family Medicine

## 2020-12-31 ENCOUNTER — Other Ambulatory Visit: Payer: Self-pay

## 2020-12-31 ENCOUNTER — Ambulatory Visit (INDEPENDENT_AMBULATORY_CARE_PROVIDER_SITE_OTHER): Payer: Medicare PPO | Admitting: Physical Therapy

## 2020-12-31 DIAGNOSIS — R293 Abnormal posture: Secondary | ICD-10-CM

## 2020-12-31 DIAGNOSIS — M6281 Muscle weakness (generalized): Secondary | ICD-10-CM

## 2020-12-31 DIAGNOSIS — M542 Cervicalgia: Secondary | ICD-10-CM | POA: Diagnosis not present

## 2020-12-31 NOTE — Telephone Encounter (Signed)
Please see message and advise.  Thank you. Can pt come in for lab work?

## 2020-12-31 NOTE — Therapy (Signed)
[facility] Physical Therapy [address] [place], [place], [address] Phone: [phone]   Fax:  [phone]  Physical Therapy Treatment  Patient Details  Name: Patricia Peck MRN: [id number] Date of Birth: 11-16-1945 Referring Provider (PT): Dr. Junius Roads   Encounter Date: 12/31/2020   PT End of Session - 12/31/20 0958    Visit Number 4    Number of Visits 8    Date for PT Re-Evaluation 02/01/21    Progress Note Due on Visit 10    PT Start Time 0935    PT Stop Time 1015    PT Time Calculation (min) 40 min    Activity Tolerance Patient tolerated treatment well;No increased pain    Behavior During Therapy WFL for tasks assessed/performed           Past Medical History:  Diagnosis Date  . Allergy   . Arthritis    hands  . Asthma   . Atrial fibrillation ([place])   . Atrial flutter ([place])   . Depression   . Dysrhythmia    a-fib, on Xarelto  . GERD (gastroesophageal reflux disease)   . Hyperlipidemia   . Hypertension   . Hypothyroidism   . Osteoporosis   . TIA (transient ischemic attack)     Past Surgical History:  Procedure Laterality Date  . ABDOMINAL HYSTERECTOMY    . COLONOSCOPY    . MYRINGOTOMY WITH TUBE PLACEMENT Right 01/14/2016   Procedure: RIGHT MYRINGOTOMY WITH T-TUBE PLACEMENT;  Surgeon: Leta Baptist, MD;  Location: [place];  Service: ENT;  Laterality: Right;  . PARTIAL HYSTERECTOMY  1989  . TEE WITHOUT CARDIOVERSION N/A 12/22/2012   Procedure: TRANSESOPHAGEAL ECHOCARDIOGRAM (TEE);  Surgeon: Lelon Perla, MD;  Location[facility] ENDOSCOPY;  Service: Cardiovascular;  Laterality: N/A;    There were no vitals filed for this visit.   Subjective Assessment - 12/31/20 0952    Subjective Pt arriving today reporting no pain today.    Pertinent History a-fib, HTN, Osteoporosis, depression, stenosis subclavian artery, PAF, TIA    How long can you sit comfortably? "not long"    Patient Stated Goals Be able to go on my cruise with no pain     Currently in Pain? No/denies              [facility] PT Assessment - 12/31/20 0001      Assessment   Medical Diagnosis M54.2 neck pain    Referring Provider (PT) Dr. Junius Roads      AROM   Cervical Flexion 50    Cervical Extension 38    Cervical - Right Rotation 68    Cervical - Left Rotation 60                         [facility] Adult PT Treatment/Exercise - 12/31/20 0001      Exercises   Exercises Neck      Neck Exercises: Machines for Strengthening   UBE (Upper Arm Bike) 2 minutes forward and back      Neck Exercises: Theraband   Scapula Retraction 15 reps;Limitations      Neck Exercises: Standing   Other Standing Exercises scapular retraction 2x10 holding 3-4 seconds each, rows: red theraband x 15 reps, shoulder extension 2x15 red theraband    Other Standing Exercises cervical rotation x 5 holding 5 seconds each      Neck Exercises: Supine   Neck Retraction 10 reps;5 secs    Other Supine Exercise shoulder flexion with  1# bar, no impingement symptoms reported or noticied this session ofther than shoulder tightness from stretch   2x10   Other Supine Exercise serratus punches with 1# bar 2x10      Neck Exercises: Stretches   Upper Trapezius Stretch Right;Left;3 reps;10 seconds    Levator Stretch 2 reps;10 seconds    Corner Stretch 3 reps;10 seconds                  PT Education - 12/31/20 0956    Education Details P concerned about her harir falling out. I advised pt to consult her PCP/nurse about recommnedations on taking multiple vitatmins.    Person(s) Educated Patient    Methods Explanation    Comprehension Verbalized understanding            PT Short Term Goals - 12/31/20 1007      PT SHORT TERM GOAL #1   Title Pt will be independent in her initial HEP.    Time 3    Period Weeks    Status Achieved    Target Date 12/28/20             PT Long Term Goals - 12/31/20 1008      PT LONG TERM GOAL #1   Title Pt will improve her  bilateral cervical rotation to >/= 65 degrees to improve driving function.    Period Weeks    Status On-going      PT LONG TERM GOAL #2   Title Pt will be able to report driving for more than 1 hour with pain </= 2/10.    Status On-going      PT LONG TERM GOAL #3   Title Pt will be able to reach behind her head for ADL's/ bathing and dressing with no pain.    Period Weeks    Status On-going                 Plan - 12/31/20 1000    Clinical Impression Statement Pt tolerating exercises well today reporting mild fatigue after UBE set at level 1. Pt with questioning her new onset of her hair falling out. Pt was advised to contact her PCP's office about beginning a multiple vitamin and with her conerns. Pt also advised to consult her hiar dresser. Pt reporting no pain at beginning of session and no pain noted at end of session. Pt tolerating retraction exercises and shoulder, cerival ROM and strengthening well. Pt has improved in cervical flexion/ extension/ and bialteral rotation ( see flow sheets). Continue skilled PT.    Personal Factors and Comorbidities Comorbidity 3+    Comorbidities A-fib, OA, HTN, depression, stenosis subclavian artery, PAF, TIA    Examination-Participation Restrictions Other;Community Activity;Driving    Stability/Clinical Decision Making Stable/Uncomplicated    Rehab Potential Good    PT Frequency 1x / week    PT Duration 8 weeks    PT Treatment/Interventions ADLs/Self Care Home Management;Cryotherapy;Electrical Stimulation;Moist Heat;Ultrasound;Iontophoresis 4mg /ml Dexamethasone;Functional mobility training;Therapeutic activities;Patient/family education;Neuromuscular re-education;Therapeutic exercise;Manual techniques;Passive range of motion;Dry needling;Taping    PT Next Visit Plan assess DN  to R upper trap if pain worsens STM to R upper trap, cervical ROM, shoulder ROM, strengthening    PT Home Exercise Plan Access Code: [email or web address], [email or web address]    Consulted and  Agree with Plan of Care Patient           Patient will benefit from skilled therapeutic intervention in order to improve the following deficits and impairments:  Pain,Postural dysfunction,Decreased strength,Decreased range  of motion,Decreased activity tolerance,Impaired UE functional use  Visit Diagnosis: Cervicalgia  Muscle weakness (generalized)  Abnormal posture     Problem List Patient Active Problem List   Diagnosis Date Noted  . Left arm pain 11/22/2019  . Stenosis of left subclavian artery ([place]) 09/14/2019  . History of TIA (transient ischemic attack) 08/01/2019  . Anticoagulated 08/01/2019  . Hyperlipidemia   . Situational anxiety 05/23/2019  . IBS (irritable bowel syndrome) 04/11/2013  . PAF (paroxysmal atrial fibrillation) ([place]) 01/27/2013  . Cerebrovascular disease 01/27/2013  . Insomnia 11/02/2012  . GERD 03/12/2010  . Hypothyroidism 04/14/2007  . Depression 04/14/2007  . Essential hypertension 04/14/2007  . Osteoporosis 04/14/2007    Oretha Caprice, PT, MPT 12/31/2020, 10:15 AM  [facility] Physical Therapy [address] [place], [place], [address] Phone: [phone]   Fax:  [phone]  Name: Patricia Peck MRN: [id number] Date of Birth: 1945-12-11

## 2020-12-31 NOTE — Telephone Encounter (Signed)
Caller Name: Patricia Peck, self Call back phone #: [phone]  Reason for Call: Pt called asking if she is scheduled for her next Prolia (due in July 2022). Advised pt no appt. Pt is asking about lab work prior and if she can schedule. Please advise.

## 2021-01-01 DIAGNOSIS — I480 Paroxysmal atrial fibrillation: Secondary | ICD-10-CM | POA: Diagnosis not present

## 2021-01-01 DIAGNOSIS — E7800 Pure hypercholesterolemia, unspecified: Secondary | ICD-10-CM | POA: Diagnosis not present

## 2021-01-01 LAB — LIPID PANEL
Chol/HDL Ratio: 3.3 ratio (ref 0.0–4.4)
Cholesterol, Total: 137 mg/dL (ref 100–199)
HDL: 41 mg/dL (ref >39)
LDL Chol Calc (NIH): 72 mg/dL (ref 0–99)
Triglycerides: 135 mg/dL (ref 0–149)
VLDL Cholesterol Cal: 24 mg/dL (ref 5–40)

## 2021-01-01 LAB — HEPATIC FUNCTION PANEL
ALT: 12 IU/L (ref 0–32)
AST: 14 IU/L (ref 0–40)
Albumin: 4.2 g/dL (ref 3.7–4.7)
Alkaline Phosphatase: 69 IU/L (ref 44–121)
Bilirubin Total: 0.5 mg/dL (ref 0.0–1.2)
Bilirubin, Direct: 0.15 mg/dL (ref 0.00–0.40)
Total Protein: 6.4 g/dL (ref 6.0–8.5)

## 2021-01-01 LAB — BASIC METABOLIC PANEL
BUN/Creatinine Ratio: 12 (ref 12–28)
BUN: 11 mg/dL (ref 8–27)
CO2: 21 mmol/L (ref 20–29)
Calcium: 9 mg/dL (ref 8.7–10.3)
Chloride: 103 mmol/L (ref 96–106)
Creatinine, Ser: 0.89 mg/dL (ref 0.57–1.00)
Glucose: 84 mg/dL (ref 65–99)
Potassium: 3.8 mmol/L (ref 3.5–5.2)
Sodium: 141 mmol/L (ref 134–144)
eGFR: 68 mL/min/{1.73_m2} (ref >59)

## 2021-01-01 LAB — CBC
Hematocrit: 36.1 % (ref 34.0–46.6)
Hemoglobin: 11.8 g/dL (ref 11.1–15.9)
MCH: 28.4 pg (ref 26.6–33.0)
MCHC: 32.7 g/dL (ref 31.5–35.7)
MCV: 87 fL (ref 79–97)
Platelets: 372 10*3/uL (ref 150–450)
RBC: 4.15 x10E6/uL (ref 3.77–5.28)
RDW: 13.6 % (ref 11.7–15.4)
WBC: 7.8 10*3/uL (ref 3.4–10.8)

## 2021-01-03 ENCOUNTER — Other Ambulatory Visit: Payer: Self-pay | Admitting: Family Medicine

## 2021-01-07 ENCOUNTER — Ambulatory Visit (INDEPENDENT_AMBULATORY_CARE_PROVIDER_SITE_OTHER): Payer: Medicare PPO | Admitting: Physical Therapy

## 2021-01-07 ENCOUNTER — Other Ambulatory Visit: Payer: Self-pay

## 2021-01-07 ENCOUNTER — Encounter: Payer: Self-pay | Admitting: Physical Therapy

## 2021-01-07 DIAGNOSIS — M6281 Muscle weakness (generalized): Secondary | ICD-10-CM | POA: Diagnosis not present

## 2021-01-07 DIAGNOSIS — R293 Abnormal posture: Secondary | ICD-10-CM | POA: Diagnosis not present

## 2021-01-07 DIAGNOSIS — M542 Cervicalgia: Secondary | ICD-10-CM

## 2021-01-07 NOTE — Therapy (Signed)
[facility] Physical Therapy [address] [place], [place], [address] Phone: [phone]   Fax:  [phone]  Physical Therapy Treatment  Patient Details  Name: Patricia Peck MRN: [id number] Date of Birth: 11-07-1945 Referring Provider (PT): Dr. Junius Roads   Encounter Date: 01/07/2021   PT End of Session - 01/07/21 1141    Visit Number 5    Number of Visits 8    Date for PT Re-Evaluation 02/01/21    Progress Note Due on Visit 10    PT Start Time 1102    PT Stop Time 1140    PT Time Calculation (min) 38 min    Activity Tolerance Patient tolerated treatment well;No increased pain    Behavior During Therapy WFL for tasks assessed/performed           Past Medical History:  Diagnosis Date  . Allergy   . Arthritis    hands  . Asthma   . Atrial fibrillation ([place])   . Atrial flutter ([place])   . Depression   . Dysrhythmia    a-fib, on Xarelto  . GERD (gastroesophageal reflux disease)   . Hyperlipidemia   . Hypertension   . Hypothyroidism   . Osteoporosis   . TIA (transient ischemic attack)     Past Surgical History:  Procedure Laterality Date  . ABDOMINAL HYSTERECTOMY    . COLONOSCOPY    . MYRINGOTOMY WITH TUBE PLACEMENT Right 01/14/2016   Procedure: RIGHT MYRINGOTOMY WITH T-TUBE PLACEMENT;  Surgeon: Leta Baptist, MD;  Location: [place];  Service: ENT;  Laterality: Right;  . PARTIAL HYSTERECTOMY  1989  . TEE WITHOUT CARDIOVERSION N/A 12/22/2012   Procedure: TRANSESOPHAGEAL ECHOCARDIOGRAM (TEE);  Surgeon: Lelon Perla, MD;  Location: [facility] ENDOSCOPY;  Service: Cardiovascular;  Laterality: N/A;    There were no vitals filed for this visit.   Subjective Assessment - 01/07/21 1131    Subjective Pt arriving today reporting stiffness/ sore today. Pt reporting 4-5/10 pain in neck.    Pertinent History a-fib, HTN, Osteoporosis, depression, stenosis subclavian artery, PAF, TIA    How long can you sit comfortably? "not long"    Patient Stated  Goals Be able to go on my cruise with no pain    Currently in Pain? Yes    Pain Score 5     Pain Location Neck    Pain Orientation Lower    Pain Descriptors / Indicators Aching;Sore    Pain Type Acute pain    Pain Onset More than a month ago                             [facility] Adult PT Treatment/Exercise - 01/07/21 0001      Exercises   Exercises Neck      Neck Exercises: Machines for Strengthening   UBE (Upper Arm Bike) 4 minutes forward L1      Neck Exercises: Theraband   Scapula Retraction 15 reps;Limitations    Shoulder Extension 10 reps;Red;Other (comment)    Shoulder Extension Limitations 2 sets    Rows 10 reps;Red;Limitations    Rows Limitations 2 sets      Neck Exercises: Standing   Other Standing Exercises shoulder extension with 1# bar 2x 10    Other Standing Exercises cervical rotation x 5 holding 5 seconds each      Neck Exercises: Supine   Neck Retraction 10 reps;5 secs    Other Supine Exercise bench press: 2# bar x  15 reps    Other Supine Exercise serratus punches with 2# bar 2x10      Neck Exercises: Stretches   Upper Trapezius Stretch Right;Left;3 reps;10 seconds    Levator Stretch 2 reps;10 seconds    Corner Stretch 3 reps;10 seconds                  PT Education - 01/07/21 1135    Education Details Pt reported that she was able to get an appointment with an GYN and an Urologist about her incontinence.    Person(s) Educated Patient    Methods Explanation    Comprehension Verbalized understanding            PT Short Term Goals - 01/07/21 1143      PT SHORT TERM GOAL #1   Title Pt will be independent in her initial HEP.    Status On-going             PT Long Term Goals - 01/07/21 1144      PT LONG TERM GOAL #1   Title Pt will improve her bilateral cervical rotation to >/= 65 degrees to improve driving function.    Status On-going      PT LONG TERM GOAL #2   Title Pt will be able to report driving for more  than 1 hour with pain </= 2/10.    Status On-going      PT LONG TERM GOAL #3   Title Pt will be able to reach behind her head for ADL's/ bathing and dressing with no pain.    Status On-going      PT LONG TERM GOAL #4   Status On-going                 Plan - 01/07/21 1142    Clinical Impression Statement Pt tolerating exercises well. Pt arriving today reporting mild soreness of 4-5/10 in her cervical spine. Pt instructed to increase her upper trap and cervical rotational stretches at home along with shoulder exercises. Continue with skilled PT.    Examination-Activity Limitations Lift;Carry;Dressing;Other;Sit    Examination-Participation Restrictions Other;Community Activity;Driving    Stability/Clinical Decision Making Stable/Uncomplicated    Rehab Potential Good    PT Frequency 1x / week    PT Duration 8 weeks    PT Treatment/Interventions ADLs/Self Care Home Management;Cryotherapy;Electrical Stimulation;Moist Heat;Ultrasound;Iontophoresis 4mg /ml Dexamethasone;Functional mobility training;Therapeutic activities;Patient/family education;Neuromuscular re-education;Therapeutic exercise;Manual techniques;Passive range of motion;Dry needling;Taping    PT Next Visit Plan assess DN  to R upper trap if pain worsens STM to R upper trap, cervical ROM, shoulder ROM, strengthening    PT Home Exercise Plan Access Code: [email or web address]    Consulted and Agree with Plan of Care Patient           Patient will benefit from skilled therapeutic intervention in order to improve the following deficits and impairments:  Pain,Postural dysfunction,Decreased strength,Decreased range of motion,Decreased activity tolerance,Impaired UE functional use  Visit Diagnosis: Cervicalgia  Muscle weakness (generalized)  Abnormal posture     Problem List Patient Active Problem List   Diagnosis Date Noted  . Left arm pain 11/22/2019  . Stenosis of left subclavian artery ([place]) 09/14/2019  . History  of TIA (transient ischemic attack) 08/01/2019  . Anticoagulated 08/01/2019  . Hyperlipidemia   . Situational anxiety 05/23/2019  . IBS (irritable bowel syndrome) 04/11/2013  . PAF (paroxysmal atrial fibrillation) ([place]) 01/27/2013  . Cerebrovascular disease 01/27/2013  . Insomnia 11/02/2012  . GERD 03/12/2010  . Hypothyroidism 04/14/2007  .  Depression 04/14/2007  . Essential hypertension 04/14/2007  . Osteoporosis 04/14/2007    Oretha Caprice, PT, MPT 01/07/2021, 11:45 AM  [facility] Physical Therapy [address] [place], [place], [address] Phone: [phone]   Fax:  [phone]  Name: Patricia Peck MRN: [id number] Date of Birth: 06-18-1946

## 2021-01-10 DIAGNOSIS — N3946 Mixed incontinence: Secondary | ICD-10-CM | POA: Diagnosis not present

## 2021-01-10 DIAGNOSIS — N952 Postmenopausal atrophic vaginitis: Secondary | ICD-10-CM | POA: Diagnosis not present

## 2021-01-10 DIAGNOSIS — R8271 Bacteriuria: Secondary | ICD-10-CM | POA: Diagnosis not present

## 2021-01-10 DIAGNOSIS — N3021 Other chronic cystitis with hematuria: Secondary | ICD-10-CM | POA: Diagnosis not present

## 2021-01-14 ENCOUNTER — Ambulatory Visit (INDEPENDENT_AMBULATORY_CARE_PROVIDER_SITE_OTHER): Payer: Medicare PPO | Admitting: Physical Therapy

## 2021-01-14 ENCOUNTER — Other Ambulatory Visit: Payer: Self-pay

## 2021-01-14 ENCOUNTER — Encounter: Payer: Self-pay | Admitting: Physical Therapy

## 2021-01-14 DIAGNOSIS — M542 Cervicalgia: Secondary | ICD-10-CM

## 2021-01-14 DIAGNOSIS — M6281 Muscle weakness (generalized): Secondary | ICD-10-CM

## 2021-01-14 DIAGNOSIS — R293 Abnormal posture: Secondary | ICD-10-CM

## 2021-01-14 NOTE — Therapy (Addendum)
[facility] Physical Therapy [address] [place], [place], [address] Phone: [phone]   Fax:  [phone]  Physical Therapy Treatment  Patient Details  Name: Patricia Peck MRN: [id number] Date of Birth: 07-Apr-1946 Referring Provider (PT): Dr. Junius Roads   Encounter Date: 01/14/2021   PT End of Session - 01/14/21 0948    Visit Number 6    Number of Visits 8    Date for PT Re-Evaluation 02/01/21    Progress Note Due on Visit 10    PT Start Time 0935    PT Stop Time 1022    PT Time Calculation (min) 47 min    Activity Tolerance Patient tolerated treatment well;No increased pain    Behavior During Therapy WFL for tasks assessed/performed           Past Medical History:  Diagnosis Date  . Allergy   . Arthritis    hands  . Asthma   . Atrial fibrillation ([place])   . Atrial flutter ([place])   . Depression   . Dysrhythmia    a-fib, on Xarelto  . GERD (gastroesophageal reflux disease)   . Hyperlipidemia   . Hypertension   . Hypothyroidism   . Osteoporosis   . TIA (transient ischemic attack)     Past Surgical History:  Procedure Laterality Date  . ABDOMINAL HYSTERECTOMY    . COLONOSCOPY    . MYRINGOTOMY WITH TUBE PLACEMENT Right 01/14/2016   Procedure: RIGHT MYRINGOTOMY WITH T-TUBE PLACEMENT;  Surgeon: Leta Baptist, MD;  Location: [place];  Service: ENT;  Laterality: Right;  . PARTIAL HYSTERECTOMY  1989  . TEE WITHOUT CARDIOVERSION N/A 12/22/2012   Procedure: TRANSESOPHAGEAL ECHOCARDIOGRAM (TEE);  Surgeon: Lelon Perla, MD;  Location: [facility] ENDOSCOPY;  Service: Cardiovascular;  Laterality: N/A;    There were no vitals filed for this visit.   Subjective Assessment - 01/14/21 0945    Subjective Pt arriving to therapy today reporting 5/10 pain in neck.    Pertinent History a-fib, HTN, Osteoporosis, depression, stenosis subclavian artery, PAF, TIA    Limitations Sitting;House hold activities;Other (comment);Lifting    How long can you sit  comfortably? "not long"    Patient Stated Goals Be able to go on my cruise with no pain    Currently in Pain? Yes    Pain Score 5     Pain Location Neck    Pain Orientation Lower    Pain Descriptors / Indicators Aching;Sore    Pain Type Acute pain    Pain Onset More than a month ago    Pain Frequency Intermittent                           01/14/21 0001  Exercises  Exercises Neck  Neck Exercises: Machines for Strengthening  UBE (Upper Arm Bike) 4 minutes forward L1.5  Neck Exercises: Theraband  Scapula Retraction 15 reps;Limitations  Shoulder Extension 10 reps;Red;Other (comment)  Shoulder Extension Limitations 2 sets  Rows 10 reps;Red;Limitations  Rows Limitations 2 sets  Neck Exercises: Standing  Other Standing Exercises shoulder extension with 1# bar 2x 10, shoulder (rows, ER and IR) using Level 1 theraband with bilateral shoulders x 15 each  Other Standing Exercises cervical rotation x 5 holding 5 seconds each  Neck Exercises: Seated  Money 10 reps;3 secs  Money Limitations yellow theraband  Manual Therapy  Manual therapy comments STM and compression to upper traps  Neck Exercises: Stretches  Upper Trapezius Stretch Right;Left;3 reps;10  seconds  Levator Stretch 2 reps;10 seconds  Corner Stretch 3 reps;10 seconds        01/14/21 0747  Trigger Point Dry Needling  Consent Given? Yes  Education Handout Provided Yes  Muscles Treated Head and Neck Upper trapezius  Upper Trapezius Response Twitch reponse elicited         PT Short Term Goals - 01/14/21 0951      PT SHORT TERM GOAL #1   Title Pt will be independent in her initial HEP.    Baseline Pt reporting she was doing well but this week she has had more difficutly completing her exercises    Status On-going    Target Date 12/28/20             PT Long Term Goals - 01/14/21 0952      PT LONG TERM GOAL #1   Title Pt will improve her bilateral cervical rotation to >/= 65 degrees to  improve driving function.    Status On-going      PT LONG TERM GOAL #2   Title Pt will be able to report driving for more than 1 hour with pain </= 2/10.    Status On-going      PT LONG TERM GOAL #3   Title Pt will be able to reach behind her head for ADL's/ bathing and dressing with no pain.    Status On-going                 Plan - 01/14/21 1003    Clinical Impression Statement Pt arriving to therapy reporting 5/10 pain in her neck. Pt reporting difficutly turning to look at her husband when having a conversation in waiting area. Pt reporting the time change has messess up her sleeping patterns and she is more fatigued. Pt with limited cervical rotation to the R side compared to left. DN performed to R upper trap with twitch response noted.(Performed by Faustino Congress, , PT, DPT) Next visit assess response to DN. Continue skilled PT.    Personal Factors and Comorbidities Comorbidity 3+    Comorbidities A-fib, OA, HTN, depression, stenosis subclavian artery, PAF, TIA    Examination-Activity Limitations Lift;Carry;Dressing;Other;Sit    Stability/Clinical Decision Making Stable/Uncomplicated    Rehab Potential Good    PT Frequency 1x / week    PT Duration 8 weeks    PT Treatment/Interventions ADLs/Self Care Home Management;Cryotherapy;Electrical Stimulation;Moist Heat;Ultrasound;Iontophoresis 4mg /ml Dexamethasone;Functional mobility training;Therapeutic activities;Patient/family education;Neuromuscular re-education;Therapeutic exercise;Manual techniques;Passive range of motion;Dry needling;Taping    PT Next Visit Plan assess DN response  to R upper trapcervical ROM, shoulder ROM, strengthening    PT Home Exercise Plan Access Code: [email or web address]    Consulted and Agree with Plan of Care Patient           Patient will benefit from skilled therapeutic intervention in order to improve the following deficits and impairments:  Pain,Postural dysfunction,Decreased  strength,Decreased range of motion,Decreased activity tolerance,Impaired UE functional use  Visit Diagnosis: Cervicalgia  Muscle weakness (generalized)  Abnormal posture     Problem List Patient Active Problem List   Diagnosis Date Noted  . Left arm pain 11/22/2019  . Stenosis of left subclavian artery ([place]) 09/14/2019  . History of TIA (transient ischemic attack) 08/01/2019  . Anticoagulated 08/01/2019  . Hyperlipidemia   . Situational anxiety 05/23/2019  . IBS (irritable bowel syndrome) 04/11/2013  . PAF (paroxysmal atrial fibrillation) ([place]) 01/27/2013  . Cerebrovascular disease 01/27/2013  . Insomnia 11/02/2012  . GERD 03/12/2010  . Hypothyroidism  04/14/2007  . Depression 04/14/2007  . Essential hypertension 04/14/2007  . Osteoporosis 04/14/2007    Oretha Caprice , PT, MPT [place], PT, DPT 01/22/21 7:48 AM   [facility] Physical Therapy [address] [place], [place], [address] Phone: [phone]   Fax:  [phone]  Name: Patricia Peck MRN: [id number] Date of Birth: 05/15/1946

## 2021-01-16 DIAGNOSIS — L659 Nonscarring hair loss, unspecified: Secondary | ICD-10-CM | POA: Diagnosis not present

## 2021-01-16 DIAGNOSIS — L639 Alopecia areata, unspecified: Secondary | ICD-10-CM | POA: Diagnosis not present

## 2021-01-16 DIAGNOSIS — R102 Pelvic and perineal pain: Secondary | ICD-10-CM | POA: Diagnosis not present

## 2021-01-21 ENCOUNTER — Encounter: Payer: Self-pay | Admitting: Physical Therapy

## 2021-01-21 ENCOUNTER — Other Ambulatory Visit: Payer: Self-pay

## 2021-01-21 ENCOUNTER — Ambulatory Visit (INDEPENDENT_AMBULATORY_CARE_PROVIDER_SITE_OTHER): Payer: Medicare PPO | Admitting: Physical Therapy

## 2021-01-21 DIAGNOSIS — M542 Cervicalgia: Secondary | ICD-10-CM | POA: Diagnosis not present

## 2021-01-21 DIAGNOSIS — R293 Abnormal posture: Secondary | ICD-10-CM | POA: Diagnosis not present

## 2021-01-21 DIAGNOSIS — M6281 Muscle weakness (generalized): Secondary | ICD-10-CM

## 2021-01-21 NOTE — Therapy (Addendum)
[facility] Physical Therapy [address] [place], [place], [address] Phone: [phone]   Fax:  [phone]  Physical Therapy Treatment Progress Note: Recertification  Patient Details  Name: Patricia Peck MRN: [id number] Date of Birth: 27-May-1946 Referring Provider (PT): Dr. Junius Roads   Encounter Date: 01/21/2021   PT End of Session - 01/21/21 1029    Visit Number 7    Number of Visits 8    Date for PT Re-Evaluation 02/01/21    Authorization Type Recert sent on 3/87/5643, pt has one additional visits left and then new cert will begin on 01/02/9517 to 03/22/2021 for 6 additional visits 1x/ week.    Progress Note Due on Visit 18    PT Start Time 1015    PT Stop Time 1055    PT Time Calculation (min) 40 min    Activity Tolerance Patient tolerated treatment well;No increased pain    Behavior During Therapy WFL for tasks assessed/performed           Past Medical History:  Diagnosis Date   Allergy    Arthritis    hands   Asthma    Atrial fibrillation ([facility])    Atrial flutter ([facility])    Depression    Dysrhythmia    a-fib, on Xarelto   GERD (gastroesophageal reflux disease)    Hyperlipidemia    Hypertension    Hypothyroidism    Osteoporosis    TIA (transient ischemic attack)     Past Surgical History:  Procedure Laterality Date   ABDOMINAL HYSTERECTOMY     COLONOSCOPY     MYRINGOTOMY WITH TUBE PLACEMENT Right 01/14/2016   Procedure: RIGHT MYRINGOTOMY WITH T-TUBE PLACEMENT;  Surgeon: Leta Baptist, MD;  Location: [facility];  Service: ENT;  Laterality: Right;   PARTIAL HYSTERECTOMY  1989   TEE WITHOUT CARDIOVERSION N/A 12/22/2012   Procedure: TRANSESOPHAGEAL ECHOCARDIOGRAM (TEE);  Surgeon: Lelon Perla, MD;  Location: [facility] ENDOSCOPY;  Service: Cardiovascular;  Laterality: N/A;    There were no vitals filed for this visit.   Subjective Assessment - 01/21/21 1022    Subjective Pt arriving with her husband today reporting  improvements in neck pain following her last visit. Pt reporting her relief wasn't immediate but she felt so much better the next day. Pt wishing to be needled again.    Pertinent History a-fib, HTN, Osteoporosis, depression, stenosis subclavian artery, PAF, TIA    Limitations Sitting;House hold activities;Other (comment);Lifting    How long can you sit comfortably? "not long"    Patient Stated Goals Be able to go on my cruise with no pain    Currently in Pain? Yes    Pain Score 5     Pain Location Neck    Pain Orientation Lower    Pain Descriptors / Indicators Aching;Sore    Pain Type Acute pain    Pain Onset More than a month ago              [facility] PT Assessment - 01/21/21 0001      Assessment   Medical Diagnosis M54.2 neck pain    Referring Provider (PT) Dr. Junius Roads      AROM   Cervical Flexion 55    Cervical Extension 38    Cervical - Right Rotation 70    Cervical - Left Rotation 68      Strength   Strength Assessment Site Shoulder    Right/Left Shoulder Right;Left    Right Shoulder Flexion 4/5    Right Shoulder  Extension 4/5    Right Shoulder Internal Rotation 4/5    Right Shoulder External Rotation 4/5    Left Shoulder Flexion 4/5    Left Shoulder Extension 4/5    Left Shoulder Internal Rotation 4/5    Left Shoulder External Rotation 4/5                [facility] PT Assessment - 01/21/21 0001      Assessment   Medical Diagnosis M54.2 neck pain    Referring Provider (PT) Dr. Junius Roads      AROM   Cervical Flexion 55    Cervical Extension 38    Cervical - Right Rotation 70    Cervical - Left Rotation 68      Strength   Strength Assessment Site Shoulder    Right/Left Shoulder Right;Left    Right Shoulder Flexion 4/5    Right Shoulder Extension 4/5    Right Shoulder Internal Rotation 4/5    Right Shoulder External Rotation 4/5    Left Shoulder Flexion 4/5    Left Shoulder Extension 4/5    Left Shoulder Internal Rotation 4/5    Left Shoulder External  Rotation 4/5                      [facility] Adult PT Treatment/Exercise - 01/21/21 0001      Exercises   Exercises Neck      Neck Exercises: Machines for Strengthening   UBE (Upper Arm Bike) 3 minutes forward and 1 minute backward at L1.2      Neck Exercises: Theraband   Scapula Retraction 15 reps;Limitations    Shoulder Extension 10 reps;Red;Other (comment)    Shoulder Extension Limitations 2 sets    Rows 10 reps;Red;Limitations    Rows Limitations 2 sets      Neck Exercises: Standing   Other Standing Exercises shoulder extension with 1# bar 2x 10, shoulder (rows, ER and IR) using Level 1 theraband with bilateral shoulders x 15 each      Neck Exercises: Seated   Money 10 reps;3 secs    Money Limitations yellow theraband      Manual Therapy   Manual therapy comments STM and compression to upper traps      Neck Exercises: Stretches   Upper Trapezius Stretch Right;Left;3 reps;10 seconds    Levator Stretch 2 reps;10 seconds    Corner Stretch 3 reps;10 seconds            Trigger Point Dry Needling - 01/21/21 1015    Consent Given? Yes    Education Handout Provided Previously provided    Muscles Treated Head and Neck Upper trapezius    Upper Trapezius Response Twitch reponse elicited                PT Education - 01/21/21 1029    Education Details Discussed DN and response    Person(s) Educated Patient    Methods Explanation    Comprehension Verbalized understanding            PT Short Term Goals - 01/21/21 1054      PT SHORT TERM GOAL #1   Title Pt will be independent in her initial HEP.    Status Achieved             PT Long Term Goals - 01/21/21 1054      PT LONG TERM GOAL #1   Title Pt will improve her bilateral cervical rotation to >/= 65 degrees to improve  driving function.    Baseline R totation 70 degrees, L rotation 68 degrees measured in sitting    Status Achieved      PT LONG TERM GOAL #2   Title Pt will be able to report  driving for more than 1 hour with pain </= 2/10.    Baseline pt reports she hasn't driven    Status On-going      PT LONG TERM GOAL #3   Title Pt will be able to reach behind her head for ADL's/ bathing and dressing with no pain.    Status On-going                 Plan - 01/21/21 1030    Clinical Impression Statement Pt arriving to therapy today reporting 5/10 pain in her neck more on the right side. Pt with noted trigger points in bilateral upper trap. Dry Needling performed by Faustino Congress, PT, DPT. Pt tolerating all shoulder and neck exercises well.  Pt has made progress with AROM of cervical spine with flexion to 55 degrees, extension 38 degrees, right rotation 70 degrees, and left rotation 68 degrees. Pt's grossly 4/5 in bilateral shoulder strength. Requesting 6 additional visits to complete pt's POC and progress toward LTG's.    Personal Factors and Comorbidities Comorbidity 3+    Comorbidities A-fib, OA, HTN, depression, stenosis subclavian artery, PAF, TIA    Examination-Activity Limitations Lift;Carry;Dressing;Other;Sit    Examination-Participation Restrictions Other;Community Activity;Driving    Stability/Clinical Decision Making Stable/Uncomplicated    Rehab Potential Good    PT Frequency 1x / week    PT Duration Other (comment)    PT Treatment/Interventions ADLs/Self Care Home Management;Cryotherapy;Electrical Stimulation;Moist Heat;Ultrasound;Iontophoresis 4mg /ml Dexamethasone;Functional mobility training;Therapeutic activities;Patient/family education;Neuromuscular re-education;Therapeutic exercise;Manual techniques;Passive range of motion;Dry needling;Taping    PT Next Visit Plan assess DN response to R upper trapcervical ROM, shoulder ROM, strengthening    PT Home Exercise Plan Access Code: [email or web address]    Consulted and Agree with Plan of Care Patient           Patient will benefit from skilled therapeutic intervention in order to improve the following  deficits and impairments:  Pain,Postural dysfunction,Decreased strength,Decreased range of motion,Decreased activity tolerance,Impaired UE functional use  Visit Diagnosis: Cervicalgia - Plan: PT plan of care cert/re-cert  Muscle weakness (generalized) - Plan: PT plan of care cert/re-cert  Abnormal posture - Plan: PT plan of care cert/re-cert     Problem List Patient Active Problem List   Diagnosis Date Noted   Left arm pain 11/22/2019   Stenosis of left subclavian artery ([place]) 09/14/2019   History of TIA (transient ischemic attack) 08/01/2019   Anticoagulated 08/01/2019   Hyperlipidemia    Situational anxiety 05/23/2019   IBS (irritable bowel syndrome) 04/11/2013   PAF (paroxysmal atrial fibrillation) ([place]) 01/27/2013   Cerebrovascular disease 01/27/2013   Insomnia 11/02/2012   GERD 03/12/2010   Hypothyroidism 04/14/2007   Depression 04/14/2007   Essential hypertension 04/14/2007   Osteoporosis 04/14/2007    Kearney Hard, PT, MPT 01/22/21 7:56 AM  Laureen Abrahams, PT, DPT 01/22/21 7:43 AM   [facility] Physical Therapy [address] [place], [place], [address] Phone: [phone]   Fax:  [phone]  Name: Patricia Peck MRN: [id number] Date of Birth: 10-13-1946

## 2021-01-23 ENCOUNTER — Other Ambulatory Visit: Payer: Self-pay | Admitting: Family Medicine

## 2021-01-23 ENCOUNTER — Other Ambulatory Visit: Payer: Self-pay | Admitting: Cardiology

## 2021-01-27 ENCOUNTER — Other Ambulatory Visit: Payer: Self-pay | Admitting: Family Medicine

## 2021-01-28 ENCOUNTER — Ambulatory Visit (INDEPENDENT_AMBULATORY_CARE_PROVIDER_SITE_OTHER): Payer: Medicare PPO | Admitting: Physical Therapy

## 2021-01-28 ENCOUNTER — Other Ambulatory Visit: Payer: Self-pay

## 2021-01-28 ENCOUNTER — Encounter: Payer: Self-pay | Admitting: Physical Therapy

## 2021-01-28 DIAGNOSIS — M6281 Muscle weakness (generalized): Secondary | ICD-10-CM

## 2021-01-28 DIAGNOSIS — M542 Cervicalgia: Secondary | ICD-10-CM

## 2021-01-28 DIAGNOSIS — R293 Abnormal posture: Secondary | ICD-10-CM | POA: Diagnosis not present

## 2021-01-28 NOTE — Therapy (Addendum)
[facility] Physical Therapy [address] [place], [place], [address] Phone: [phone]   Fax:  [phone]  Physical Therapy Treatment/Discharge  Patient Details  Name: Patricia Peck MRN: [id number] Date of Birth: 16-Jun-1946 Referring Provider (PT): Dr. Junius Roads   Encounter Date: 01/28/2021   PT End of Session - 01/28/21 0945     Visit Number 8    Number of Visits 14    Date for PT Re-Evaluation 03/22/21    Authorization Type Recert sent on 9/52/8413, pt has one additional visits left and then new cert will begin on 12/07/4008 to 03/22/2021 for 6 additional visits 1x/ week.    Progress Note Due on Visit 18    PT Start Time [phone]    PT Stop Time 1012    PT Time Calculation (min) 41 min    Activity Tolerance Patient tolerated treatment well;No increased pain    Behavior During Therapy WFL for tasks assessed/performed             Past Medical History:  Diagnosis Date   Allergy    Arthritis    hands   Asthma    Atrial fibrillation ([facility])    Atrial flutter ([facility])    Depression    Dysrhythmia    a-fib, on Xarelto   GERD (gastroesophageal reflux disease)    Hyperlipidemia    Hypertension    Hypothyroidism    Osteoporosis    TIA (transient ischemic attack)     Past Surgical History:  Procedure Laterality Date   ABDOMINAL HYSTERECTOMY     COLONOSCOPY     MYRINGOTOMY WITH TUBE PLACEMENT Right 01/14/2016   Procedure: RIGHT MYRINGOTOMY WITH T-TUBE PLACEMENT;  Surgeon: Leta Baptist, MD;  Location: [place];  Service: ENT;  Laterality: Right;   PARTIAL HYSTERECTOMY  1989   TEE WITHOUT CARDIOVERSION N/A 12/22/2012   Procedure: TRANSESOPHAGEAL ECHOCARDIOGRAM (TEE);  Surgeon: Lelon Perla, MD;  Location: [facility] ENDOSCOPY;  Service: Cardiovascular;  Laterality: N/A;    There were no vitals filed for this visit.   Subjective Assessment - 01/28/21 0944     Subjective Pt arriving today reporting not getting enough sleep last night. Pt reporting  good response to dry needling at last visit.    Pertinent History a-fib, HTN, Osteoporosis, depression, stenosis subclavian artery, PAF, TIA    Limitations Sitting;House hold activities;Other (comment);Lifting    Currently in Pain? Yes    Pain Score 3     Pain Location Neck    Pain Orientation Right;Lower    Pain Descriptors / Indicators Aching    Pain Onset More than a month ago    Pain Frequency Intermittent                               [facility] Adult PT Treatment/Exercise - 01/28/21 0001       Exercises   Exercises Neck      Neck Exercises: Machines for Strengthening   UBE (Upper Arm Bike) 3 minutes forward and 1 minute backward at L1.2      Neck Exercises: Seated   Money 10 reps;3 secs    Money Limitations yellow theraband      Manual Therapy   Manual Therapy Taping;Soft tissue mobilization   15 minutes total   Manual therapy comments IASTM to bilateral upper traps    Kinesiotex Inhibit Muscle      Kinesiotix   Inhibit Muscle  right upper trap, 1 strip  Neck Exercises: Stretches   Upper Trapezius Stretch Right;Left;3 reps;10 seconds    Levator Stretch --    Corner Stretch 3 reps;10 seconds    Other Neck Stretches shoulder IR stretch using towel behind back x 5 holding 10 seconds each                    PT Education - 01/28/21 1010     Education Details Discussed taping/purpose and how to remove    Person(s) Educated Patient    Methods Explanation;Demonstration    Comprehension Verbalized understanding;Returned demonstration              PT Short Term Goals - 01/21/21 1054       PT SHORT TERM GOAL #1   Title Pt will be independent in her initial HEP.    Status Achieved               PT Long Term Goals - 01/28/21 1008       PT LONG TERM GOAL #1   Title Pt will improve her bilateral cervical rotation to >/= 65 degrees to improve driving function.    Baseline R totation 70 degrees, L rotation 68 degrees measured  in sitting    Status Achieved      PT LONG TERM GOAL #2   Title Pt will be able to report driving for more than 1 hour with pain </= 2/10.    Status On-going      PT LONG TERM GOAL #3   Title Pt will be able to reach behind her head for ADL's/ bathing and dressing with no pain.    Status On-going                   Plan - 01/28/21 0955     Clinical Impression Statement Pt arriving today reporting 3/10 pain in right and left upper trap . Pt reporting good response to dry needling at last visit.  Kinesotape placed today to inhibit bilateral upper traps. Continue skilled PT progressing ROM and strengthening.    Personal Factors and Comorbidities Comorbidity 3+    Comorbidities A-fib, OA, HTN, depression, stenosis subclavian artery, PAF, TIA    Examination-Activity Limitations Lift;Carry;Dressing;Other;Sit    Examination-Participation Restrictions Other;Community Activity;Driving    Stability/Clinical Decision Making Stable/Uncomplicated    Rehab Potential Good    PT Frequency 1x / week    PT Duration Other (comment)    PT Treatment/Interventions ADLs/Self Care Home Management;Cryotherapy;Electrical Stimulation;Moist Heat;Ultrasound;Iontophoresis 4mg /ml Dexamethasone;Functional mobility training;Therapeutic activities;Patient/family education;Neuromuscular re-education;Therapeutic exercise;Manual techniques;Passive range of motion;Dry needling;Taping    PT Next Visit Plan DN if needed, assess taping response,  cervical ROM, shoulder ROM, strengthening    PT Home Exercise Plan Access Code: [email or web address]    Consulted and Agree with Plan of Care Patient             Patient will benefit from skilled therapeutic intervention in order to improve the following deficits and impairments:  Pain,Postural dysfunction,Decreased strength,Decreased range of motion,Decreased activity tolerance,Impaired UE functional use  Visit Diagnosis: Cervicalgia  Muscle weakness  (generalized)  Abnormal posture     Problem List Patient Active Problem List   Diagnosis Date Noted   Left arm pain 11/22/2019   Stenosis of left subclavian artery ([place]) 09/14/2019   History of TIA (transient ischemic attack) 08/01/2019   Anticoagulated 08/01/2019   Hyperlipidemia    Situational anxiety 05/23/2019   IBS (irritable bowel syndrome) 04/11/2013   PAF (paroxysmal atrial fibrillation) ([place]) 01/27/2013  Cerebrovascular disease 01/27/2013   Insomnia 11/02/2012   GERD 03/12/2010   Hypothyroidism 04/14/2007   Depression 04/14/2007   Essential hypertension 04/14/2007   Osteoporosis 04/14/2007    Oretha Caprice, PT, MPT 01/28/2021, 10:14 AM   PHYSICAL THERAPY DISCHARGE SUMMARY  Visits from Start of Care: 8  Current functional level related to goals / functional outcomes: See note   Remaining deficits: See note   Education / Equipment: HEP   Plan: Patient agrees to discharge.  Patient goals were not met. Patient is being discharged due to meeting the stated rehab goals.     Scot Jun, PT, DPT, OCS, ATC 04/11/21  11:12 AM      [facility] Physical Therapy [address] [place], [place], [address] Phone: [phone]   Fax:  [phone]  Name: Patricia Peck MRN: [id number] Date of Birth: 27-Jun-1946

## 2021-02-01 DIAGNOSIS — R102 Pelvic and perineal pain: Secondary | ICD-10-CM | POA: Diagnosis not present

## 2021-02-01 DIAGNOSIS — R1903 Right lower quadrant abdominal swelling, mass and lump: Secondary | ICD-10-CM | POA: Diagnosis not present

## 2021-02-01 DIAGNOSIS — N816 Rectocele: Secondary | ICD-10-CM | POA: Diagnosis not present

## 2021-02-05 ENCOUNTER — Telehealth: Payer: Self-pay | Admitting: *Deleted

## 2021-02-05 ENCOUNTER — Encounter: Payer: Self-pay | Admitting: Gynecologic Oncology

## 2021-02-05 ENCOUNTER — Encounter: Payer: Medicare PPO | Admitting: Physical Therapy

## 2021-02-05 NOTE — Telephone Encounter (Signed)
Spoke with the patient and scheduled a new patient appt with Dr Denman George on 4/7 at 12:15 pm. Patient given an arrival time of 11:45 am. Patient given address and phone number of the clinic. Patient also given the policy for mask and visitors.

## 2021-02-06 ENCOUNTER — Telehealth: Payer: Self-pay | Admitting: *Deleted

## 2021-02-06 NOTE — Telephone Encounter (Signed)
Returned the patient's call and left a message with the address of the clinic

## 2021-02-07 ENCOUNTER — Inpatient Hospital Stay: Payer: Medicare PPO | Attending: Gynecologic Oncology | Admitting: Gynecologic Oncology

## 2021-02-07 ENCOUNTER — Other Ambulatory Visit: Payer: Self-pay

## 2021-02-07 ENCOUNTER — Encounter: Payer: Self-pay | Admitting: Gynecologic Oncology

## 2021-02-07 VITALS — BP 144/100 | HR 74 | Temp 97.8°F | Resp 18 | Ht 64.0 in | Wt 122.0 lb

## 2021-02-07 DIAGNOSIS — E785 Hyperlipidemia, unspecified: Secondary | ICD-10-CM | POA: Insufficient documentation

## 2021-02-07 DIAGNOSIS — I4891 Unspecified atrial fibrillation: Secondary | ICD-10-CM | POA: Insufficient documentation

## 2021-02-07 DIAGNOSIS — K219 Gastro-esophageal reflux disease without esophagitis: Secondary | ICD-10-CM | POA: Diagnosis not present

## 2021-02-07 DIAGNOSIS — Z8673 Personal history of transient ischemic attack (TIA), and cerebral infarction without residual deficits: Secondary | ICD-10-CM | POA: Insufficient documentation

## 2021-02-07 DIAGNOSIS — E039 Hypothyroidism, unspecified: Secondary | ICD-10-CM | POA: Diagnosis not present

## 2021-02-07 DIAGNOSIS — N83201 Unspecified ovarian cyst, right side: Secondary | ICD-10-CM

## 2021-02-07 DIAGNOSIS — Z79899 Other long term (current) drug therapy: Secondary | ICD-10-CM | POA: Diagnosis not present

## 2021-02-07 DIAGNOSIS — F32A Depression, unspecified: Secondary | ICD-10-CM | POA: Diagnosis not present

## 2021-02-07 DIAGNOSIS — I10 Essential (primary) hypertension: Secondary | ICD-10-CM | POA: Insufficient documentation

## 2021-02-07 DIAGNOSIS — N83291 Other ovarian cyst, right side: Secondary | ICD-10-CM | POA: Insufficient documentation

## 2021-02-07 DIAGNOSIS — Z7901 Long term (current) use of anticoagulants: Secondary | ICD-10-CM | POA: Diagnosis not present

## 2021-02-07 DIAGNOSIS — N9489 Other specified conditions associated with female genital organs and menstrual cycle: Secondary | ICD-10-CM

## 2021-02-07 MED ORDER — HYDROCODONE-ACETAMINOPHEN 5-325 MG PO TABS: 1.0000 | ORAL_TABLET | Freq: Four times a day (QID) | ORAL | 0 refills | Status: DC | PRN

## 2021-02-07 MED ORDER — SENNOSIDES-DOCUSATE SODIUM 8.6-50 MG PO TABS
2.0000 | ORAL_TABLET | Freq: Every day | ORAL | 0 refills | Status: DC
Start: 2021-02-07 — End: 2021-03-06

## 2021-02-07 NOTE — Progress Notes (Signed)
Consult Note: Gyn-Onc  Consult was requested by Dr. Julien Girt for the evaluation of Patricia Peck 75 y.o. female  CC:  Chief Complaint  Patient presents with  . Adnexal mass    Assessment/Plan:  [name]  is a 75 y.o.  year old with a right ovarian complex cyst in the setting of a normal CA 125.   I have a low suspicion of malignancy given the reassuring Ca1 25 and the overall benign-appearing nature of the cyst.  However given the solid area of the cyst which contains blood flow surgical removal is recommended if operative risk is favorable.  I feel this is the case for this patient.  I also offered her alternative of close surveillance with 6 monthly ultrasounds for 5 years.  The patient is electing for surgical removal.  We will plan on a robotic assisted BSO with possible staging.  I described the procedure and the anticipated recovery to the patient.  I explained that she is at increased risk for bleeding complications due to Xarelto use.  I am recommending cessation of Xarelto 3 days before surgery and remaining off Xarelto for 7 days postoperatively.  I explained that this does not completely eliminate her risk for bleeding complications but mitigates it somewhat.  I explained that she has risk of stroke while off Xarelto.  The patient is electing for surgery on February 14, 2021.   HPI: Ms Patricia Peck is a 75 year old P2 who was seen in consultation at the request of Dr Julien Girt for evaluation of a right ovarian complex cyst.  The patient did not have a gynecologist established and had symptoms of recurrent urinary tract infections and pelvic burning.  When she mentioned this to her general practitioner they referred her to Dr. Julien Girt for further work-up and evaluation.  Of note she also sees Dr. Arnette Schaumann for urinary symptoms.  At the time of her consultation with Dr. Julien Girt on 02/01/2021 she underwent a transvaginal ultrasound scan which revealed a right adnexa  containing a 6.5 x 4.8 x 5.4 cm complex cystic mass that replaced the right ovaries.  It contained septations and low-level echoes.  There was solid areas with blood flow seen within the solid areas.  There was no left ovary visualized and no free fluid.  The patient reported a remote history of a hysterectomy at age 17 for menorrhagia.  She reported that her left ovary was taken at that time of that surgery.  Ca1 25 was drawn on 02/01/2021 and was normal at 9.5. Her medical history is most significant for hypertension, atrial fibrillation which is rate controlled and on anticoagulant therapy, allergies, arthritis of the hand.  Her surgical history is most significant for total abdominal hysterectomy and left salpingo-oophorectomy at age 43 for menorrhagia, abdominoplasty.  Her gynecologic history is remarkable for 2 prior vaginal deliveries.  Her family cancer history is remarkable for her mother having breast cancer and her maternal aunt having she describes a stomach cancer..  She lives with her husband and adult son, Eddie Dibbles.   Current Meds:  Outpatient Encounter Medications as of 02/07/2021  Medication Sig  . ADVAIR DISKUS 100-50 MCG/DOSE AEPB Inhale 1 puff into the lungs in the morning and at bedtime. Maintain upcoming appt for additional refill  . Alpha-D-Galactosidase (BEANO PO) Take by mouth 2 (two) times daily.  Marland Kitchen atorvastatin (LIPITOR) 80 MG tablet Take 1 tablet (80 mg total) by mouth daily.  . Bepotastine Besilate 1.5 % SOLN Use  one drop in each eye twice a day  . bumetanide (BUMEX) 1 MG tablet Take 1 tablet (1 mg total) by mouth 2 (two) times daily.  . Calcium Carbonate-Vitamin D 600-400 MG-UNIT tablet Take 1 tablet by mouth 2 (two) times daily.  . Cholecalciferol (VITAMIN D-3) 5000 UNITS TABS Take one tablet by mouth three times a week  . denosumab (PROLIA) 60 MG/ML SOLN injection Inject 60 mg into the skin once. Administer in upper arm, thigh, or abdomen  . diclofenac sodium  (VOLTAREN) 1 % GEL Apply 2g to affected area qid prn  . famotidine (PEPCID AC) 10 MG chewable tablet Chew 1 tablet (10 mg total) by mouth daily.  Marland Kitchen ipratropium (ATROVENT) 0.06 % nasal spray Place 2 sprays into both nostrils 3 (three) times daily. (Patient taking differently: Place 2 sprays into both nostrils in the morning and at bedtime.)  . levocetirizine (XYZAL) 5 MG tablet TAKE 1 TABLET EVERY DAY  . levothyroxine (SYNTHROID) 50 MCG tablet TAKE 1 TABLET (50 MCG TOTAL) BY MOUTH DAILY. MAINTAIN UPCOMING APPT FOR ADDITIONAL REFILL  . metoprolol succinate (TOPROL-XL) 50 MG 24 hr tablet Take 1 tablet (50 mg total) by mouth daily. Maintain upcoming appt for additional refill  . NEXIUM 40 MG capsule TAKE 1 CAPSULE EVERY DAY  . potassium chloride (KLOR-CON) 10 MEQ tablet Take 1 tablet (10 mEq total) by mouth 2 (two) times daily.  Marland Kitchen rOPINIRole (REQUIP) 0.25 MG tablet TAKE 1 TO 2 TABLETS BY MOUTH AT BEDTIME  . XARELTO 15 MG TABS tablet TAKE 1 TABLET EVERY DAY  WITH  SUPPER  . amLODipine (NORVASC) 5 MG tablet Take 1 tablet (5 mg total) by mouth daily.  . clonazePAM (KLONOPIN) 0.5 MG tablet TAKE 1 TABLET BY MOUTH AT BEDTIME AS NEEDED FOR ANXIETY (Patient not taking: Reported on 02/05/2021)  . ezetimibe (ZETIA) 10 MG tablet Take 1 tablet (10 mg total) by mouth daily.  Marland Kitchen HYDROcodone-acetaminophen (NORCO/VICODIN) 5-325 MG tablet Take 1 tablet by mouth every 6 (six) hours as needed for moderate pain. (Patient not taking: Reported on 02/05/2021)  . nystatin (MYCOSTATIN) 100000 UNIT/ML suspension SWISH BY MOUTH 10 ML UP TO 4 TIMES A DAY (Patient not taking: Reported on 02/05/2021)  . traZODone (DESYREL) 50 MG tablet Take 1 tablet by mouth at bedtime.  . [DISCONTINUED] AMBULATORY NON FORMULARY MEDICATION Massage therapy Neck pain.  . [DISCONTINUED] ondansetron (ZOFRAN ODT) 4 MG disintegrating tablet Take 1-2 tablets (4-8 mg total) by mouth every 8 (eight) hours as needed for nausea or vomiting.  . [DISCONTINUED]  oxybutynin (DITROPAN-XL) 5 MG 24 hr tablet TAKE 1 TABLET DAILY MAY INCREASE TO 2 TABS DAILY AFTER 1 WEEK IF NEEDED  . [DISCONTINUED] Suvorexant (BELSOMRA) 20 MG TABS Take 1 tablet by mouth at bedtime.   No facility-administered encounter medications on file as of 02/07/2021.    Allergy: No Known Allergies  Social Hx:   Social History   Socioeconomic History  . Marital status: Married    Spouse name: Not on file  . Number of children: 2  . Years of education: Not on file  . Highest education level: Not on file  Occupational History    Employer: RETIRED  Tobacco Use  . Smoking status: Never Smoker  . Smokeless tobacco: Never Used  Vaping Use  . Vaping Use: Never used  Substance and Sexual Activity  . Alcohol use: Yes    Comment: Rare  . Drug use: No  . Sexual activity: Not Currently  Other Topics Concern  .  Not on file  Social History Narrative   Does have HPOA- sons Eddie Dibbles and Wille Glaser.   Does desire life support if not futile.   Not sure about feeding tubes.   Social Determinants of Health   Financial Resource Strain: Low Risk   . Difficulty of Paying Living Expenses: Not hard at all  Food Insecurity: No Food Insecurity  . Worried About Charity fundraiser in the Last Year: Never true  . Ran Out of Food in the Last Year: Never true  Transportation Needs: No Transportation Needs  . Lack of Transportation (Medical): No  . Lack of Transportation (Non-Medical): No  Physical Activity: Inactive  . Days of Exercise per Week: 0 days  . Minutes of Exercise per Session: 0 min  Stress: No Stress Concern Present  . Feeling of Stress : Not at all  Social Connections: Moderately Isolated  . Frequency of Communication with Friends and Family: More than three times a week  . Frequency of Social Gatherings with Friends and Family: Once a week  . Attends Religious Services: Never  . Active Member of Clubs or Organizations: No  . Attends Archivist Meetings: Never  . Marital  Status: Married  Human resources officer Violence: Not At Risk  . Fear of Current or Ex-Partner: No  . Emotionally Abused: No  . Physically Abused: No  . Sexually Abused: No    Past Surgical Hx:  Past Surgical History:  Procedure Laterality Date  . ABDOMINAL HYSTERECTOMY    . COLONOSCOPY    . MYRINGOTOMY WITH TUBE PLACEMENT Right 01/14/2016   Procedure: RIGHT MYRINGOTOMY WITH T-TUBE PLACEMENT;  Surgeon: Leta Baptist, MD;  Location: [facility];  Service: ENT;  Laterality: Right;  . PARTIAL HYSTERECTOMY  1989  . TEE WITHOUT CARDIOVERSION N/A 12/22/2012   Procedure: TRANSESOPHAGEAL ECHOCARDIOGRAM (TEE);  Surgeon: Lelon Perla, MD;  Location: [facility] ENDOSCOPY;  Service: Cardiovascular;  Laterality: N/A;    Past Medical Hx:  Past Medical History:  Diagnosis Date  . Allergy   . Arthritis    hands  . Asthma   . Atrial fibrillation ([place])   . Atrial flutter ([place])   . Depression   . Dysrhythmia    a-fib, on Xarelto  . GERD (gastroesophageal reflux disease)   . Hyperlipidemia   . Hypertension   . Hypothyroidism   . Osteoporosis   . TIA (transient ischemic attack)     Past Gynecological History: SVD x2, status post hysterectomy for menorrhagia No LMP recorded. Patient has had a hysterectomy.  Family Hx:  Family History  Problem Relation Age of Onset  . Breast cancer Mother   . Drug abuse Son   . Depression Son   . Stroke Paternal Aunt   . Heart disease Paternal Grandfather   . Diabetes Maternal Grandfather   . Colon cancer Neg Hx   . Ovarian cancer Neg Hx   . Endometrial cancer Neg Hx   . Pancreatic cancer Neg Hx   . Prostate cancer Neg Hx     Review of Systems:  Constitutional  Feels well,    ENT Normal appearing ears and nares bilaterally Skin/Breast  No rash, sores, jaundice, itching, dryness Cardiovascular  No chest pain, shortness of breath, or edema  Pulmonary  No cough or wheeze.  Gastro Intestinal  No nausea, vomitting, or diarrhoea. No bright red  blood per rectum, no abdominal pain, change in bowel movement, or constipation.  Genito Urinary  + pelvic and urinary burning Musculo Skeletal  No myalgia, arthralgia, joint swelling or pain  Neurologic  No weakness, numbness, change in gait,  Psychology  No depression, anxiety, insomnia.   Vitals:  Blood pressure (!) 144/100, pulse 74, temperature 97.8 F (36.6 C), resp. rate 18, height 5\' 4"  (1.626 m), weight 122 lb (55.3 kg), SpO2 100 %.  Physical Exam: WD in NAD Neck  Supple NROM, without any enlargements.  Lymph Node Survey No cervical supraclavicular or inguinal adenopathy Cardiovascular  Irregular rhythm. Normal rate. Lungs  No increased WOB. Skin  No rash/lesions/breakdown  Psychiatry  Alert and oriented to person, place, and time  Abdomen  Normoactive bowel sounds, abdomen soft, non-tender and thin without evidence of hernia. Back No CVA tenderness Genito Urinary  Vulva/vagina: Normal external female genitalia.  No lesions. No discharge or bleeding.  Bladder/urethra:  No lesions or masses, well supported bladder  Vagina: smooth  Adnexa: no palpable masses. Rectal  Good tone, no masses no cul de sac nodularity.  Extremities  No bilateral cyanosis, clubbing or edema.  60 minutes of total time was spent for this patient encounter, including preparation, face-to-face counseling with the patient and coordination of care, review of imaging (results and images), communication with the referring provider and documentation of the encounter.   Thereasa Solo, MD  02/07/2021, 12:57 PM

## 2021-02-07 NOTE — H&P (View-Only) (Signed)
Consult Note: Gyn-Onc  Consult was requested by Dr. Julien Girt for the evaluation of Patricia Peck 75 y.o. female  CC:  Chief Complaint  Patient presents with  . Adnexal mass    Assessment/Plan:  Patricia. Patricia Peck  is a 75 y.o.  year old with a right ovarian complex cyst in the setting of a normal CA 125.   I have a low suspicion of malignancy given the reassuring Ca1 25 and the overall benign-appearing nature of the cyst.  However given the solid area of the cyst which contains blood flow surgical removal is recommended if operative risk is favorable.  I feel this is the case for this patient.  I also offered her alternative of close surveillance with 6 monthly ultrasounds for 5 years.  The patient is electing for surgical removal.  We will plan on a robotic assisted BSO with possible staging.  I described the procedure and the anticipated recovery to the patient.  I explained that she is at increased risk for bleeding complications due to Xarelto use.  I am recommending cessation of Xarelto 3 days before surgery and remaining off Xarelto for 7 days postoperatively.  I explained that this does not completely eliminate her risk for bleeding complications but mitigates it somewhat.  I explained that she has risk of stroke while off Xarelto.  The patient is electing for surgery on February 14, 2021.   HPI: Patricia Peck is a 75 year old P2 who was seen in consultation at the request of Dr Julien Girt for evaluation of a right ovarian complex cyst.  The patient did not have a gynecologist established and had symptoms of recurrent urinary tract infections and pelvic burning.  When she mentioned this to her general practitioner they referred her to Dr. Julien Girt for further work-up and evaluation.  Of note she also sees Dr. Arnette Schaumann for urinary symptoms.  At the time of her consultation with Dr. Julien Girt on 02/01/2021 she underwent a transvaginal ultrasound scan which revealed a right adnexa  containing a 6.5 x 4.8 x 5.4 cm complex cystic mass that replaced the right ovaries.  It contained septations and low-level echoes.  There was solid areas with blood flow seen within the solid areas.  There was no left ovary visualized and no free fluid.  The patient reported a remote history of a hysterectomy at age 72 for menorrhagia.  She reported that her left ovary was taken at that time of that surgery.  Ca1 25 was drawn on 02/01/2021 and was normal at 9.5. Her medical history is most significant for hypertension, atrial fibrillation which is rate controlled and on anticoagulant therapy, allergies, arthritis of the hand.  Her surgical history is most significant for total abdominal hysterectomy and left salpingo-oophorectomy at age 62 for menorrhagia, abdominoplasty.  Her gynecologic history is remarkable for 2 prior vaginal deliveries.  Her family cancer history is remarkable for her mother having breast cancer and her maternal aunt having she describes a stomach cancer..  She lives with her husband and adult son, Patricia Peck.   Current Meds:  Outpatient Encounter Medications as of 02/07/2021  Medication Sig  . ADVAIR DISKUS 100-50 MCG/DOSE AEPB Inhale 1 puff into the lungs in the morning and at bedtime. Maintain upcoming appt for additional refill  . Alpha-D-Galactosidase (BEANO PO) Take by mouth 2 (two) times daily.  Marland Kitchen atorvastatin (LIPITOR) 80 MG tablet Take 1 tablet (80 mg total) by mouth daily.  . Bepotastine Besilate 1.5 % SOLN Use  one drop in each eye twice a day  . bumetanide (BUMEX) 1 MG tablet Take 1 tablet (1 mg total) by mouth 2 (two) times daily.  . Calcium Carbonate-Vitamin D 600-400 MG-UNIT tablet Take 1 tablet by mouth 2 (two) times daily.  . Cholecalciferol (VITAMIN D-3) 5000 UNITS TABS Take one tablet by mouth three times a week  . denosumab (PROLIA) 60 MG/ML SOLN injection Inject 60 mg into the skin once. Administer in upper arm, thigh, or abdomen  . diclofenac sodium  (VOLTAREN) 1 % GEL Apply 2g to affected area qid prn  . famotidine (PEPCID AC) 10 MG chewable tablet Chew 1 tablet (10 mg total) by mouth daily.  Marland Kitchen ipratropium (ATROVENT) 0.06 % nasal spray Place 2 sprays into both nostrils 3 (three) times daily. (Patient taking differently: Place 2 sprays into both nostrils in the morning and at bedtime.)  . levocetirizine (XYZAL) 5 MG tablet TAKE 1 TABLET EVERY DAY  . levothyroxine (SYNTHROID) 50 MCG tablet TAKE 1 TABLET (50 MCG TOTAL) BY MOUTH DAILY. MAINTAIN UPCOMING APPT FOR ADDITIONAL REFILL  . metoprolol succinate (TOPROL-XL) 50 MG 24 hr tablet Take 1 tablet (50 mg total) by mouth daily. Maintain upcoming appt for additional refill  . NEXIUM 40 MG capsule TAKE 1 CAPSULE EVERY DAY  . potassium chloride (KLOR-CON) 10 MEQ tablet Take 1 tablet (10 mEq total) by mouth 2 (two) times daily.  Marland Kitchen rOPINIRole (REQUIP) 0.25 MG tablet TAKE 1 TO 2 TABLETS BY MOUTH AT BEDTIME  . XARELTO 15 MG TABS tablet TAKE 1 TABLET EVERY DAY  WITH  SUPPER  . amLODipine (NORVASC) 5 MG tablet Take 1 tablet (5 mg total) by mouth daily.  . clonazePAM (KLONOPIN) 0.5 MG tablet TAKE 1 TABLET BY MOUTH AT BEDTIME AS NEEDED FOR ANXIETY (Patient not taking: Reported on 02/05/2021)  . ezetimibe (ZETIA) 10 MG tablet Take 1 tablet (10 mg total) by mouth daily.  Marland Kitchen HYDROcodone-acetaminophen (NORCO/VICODIN) 5-325 MG tablet Take 1 tablet by mouth every 6 (six) hours as needed for moderate pain. (Patient not taking: Reported on 02/05/2021)  . nystatin (MYCOSTATIN) 100000 UNIT/ML suspension SWISH BY MOUTH 10 ML UP TO 4 TIMES A DAY (Patient not taking: Reported on 02/05/2021)  . traZODone (DESYREL) 50 MG tablet Take 1 tablet by mouth at bedtime.  . [DISCONTINUED] AMBULATORY NON FORMULARY MEDICATION Massage therapy Neck pain.  . [DISCONTINUED] ondansetron (ZOFRAN ODT) 4 MG disintegrating tablet Take 1-2 tablets (4-8 mg total) by mouth every 8 (eight) hours as needed for nausea or vomiting.  . [DISCONTINUED]  oxybutynin (DITROPAN-XL) 5 MG 24 hr tablet TAKE 1 TABLET DAILY MAY INCREASE TO 2 TABS DAILY AFTER 1 WEEK IF NEEDED  . [DISCONTINUED] Suvorexant (BELSOMRA) 20 MG TABS Take 1 tablet by mouth at bedtime.   No facility-administered encounter medications on file as of 02/07/2021.    Allergy: No Known Allergies  Social Hx:   Social History   Socioeconomic History  . Marital status: Married    Spouse name: Not on file  . Number of children: 2  . Years of education: Not on file  . Highest education level: Not on file  Occupational History    Employer: RETIRED  Tobacco Use  . Smoking status: Never Smoker  . Smokeless tobacco: Never Used  Vaping Use  . Vaping Use: Never used  Substance and Sexual Activity  . Alcohol use: Yes    Comment: Rare  . Drug use: No  . Sexual activity: Not Currently  Other Topics Concern  .  Not on file  Social History Narrative   Does have HPOA- sons Patricia Peck and Wille Glaser.   Does desire life support if not futile.   Not sure about feeding tubes.   Social Determinants of Health   Financial Resource Strain: Low Risk   . Difficulty of Paying Living Expenses: Not hard at all  Food Insecurity: No Food Insecurity  . Worried About Charity fundraiser in the Last Year: Never true  . Ran Out of Food in the Last Year: Never true  Transportation Needs: No Transportation Needs  . Lack of Transportation (Medical): No  . Lack of Transportation (Non-Medical): No  Physical Activity: Inactive  . Days of Exercise per Week: 0 days  . Minutes of Exercise per Session: 0 min  Stress: No Stress Concern Present  . Feeling of Stress : Not at all  Social Connections: Moderately Isolated  . Frequency of Communication with Friends and Family: More than three times a week  . Frequency of Social Gatherings with Friends and Family: Once a week  . Attends Religious Services: Never  . Active Member of Clubs or Organizations: No  . Attends Archivist Meetings: Never  . Marital  Status: Married  Human resources officer Violence: Not At Risk  . Fear of Current or Ex-Partner: No  . Emotionally Abused: No  . Physically Abused: No  . Sexually Abused: No    Past Surgical Hx:  Past Surgical History:  Procedure Laterality Date  . ABDOMINAL HYSTERECTOMY    . COLONOSCOPY    . MYRINGOTOMY WITH TUBE PLACEMENT Right 01/14/2016   Procedure: RIGHT MYRINGOTOMY WITH T-TUBE PLACEMENT;  Surgeon: Leta Baptist, MD;  Location: [facility];  Service: ENT;  Laterality: Right;  . PARTIAL HYSTERECTOMY  1989  . TEE WITHOUT CARDIOVERSION N/A 12/22/2012   Procedure: TRANSESOPHAGEAL ECHOCARDIOGRAM (TEE);  Surgeon: Lelon Perla, MD;  Location: [facility] ENDOSCOPY;  Service: Cardiovascular;  Laterality: N/A;    Past Medical Hx:  Past Medical History:  Diagnosis Date  . Allergy   . Arthritis    hands  . Asthma   . Atrial fibrillation ([place])   . Atrial flutter ([place])   . Depression   . Dysrhythmia    a-fib, on Xarelto  . GERD (gastroesophageal reflux disease)   . Hyperlipidemia   . Hypertension   . Hypothyroidism   . Osteoporosis   . TIA (transient ischemic attack)     Past Gynecological History: SVD x2, status post hysterectomy for menorrhagia No LMP recorded. Patient has had a hysterectomy.  Family Hx:  Family History  Problem Relation Age of Onset  . Breast cancer Mother   . Drug abuse Son   . Depression Son   . Stroke Paternal Aunt   . Heart disease Paternal Grandfather   . Diabetes Maternal Grandfather   . Colon cancer Neg Hx   . Ovarian cancer Neg Hx   . Endometrial cancer Neg Hx   . Pancreatic cancer Neg Hx   . Prostate cancer Neg Hx     Review of Systems:  Constitutional  Feels well,    ENT Normal appearing ears and nares bilaterally Skin/Breast  No rash, sores, jaundice, itching, dryness Cardiovascular  No chest pain, shortness of breath, or edema  Pulmonary  No cough or wheeze.  Gastro Intestinal  No nausea, vomitting, or diarrhoea. No bright red  blood per rectum, no abdominal pain, change in bowel movement, or constipation.  Genito Urinary  + pelvic and urinary burning Musculo Skeletal  No myalgia, arthralgia, joint swelling or pain  Neurologic  No weakness, numbness, change in gait,  Psychology  No depression, anxiety, insomnia.   Vitals:  Blood pressure (!) 144/100, pulse 74, temperature 97.8 F (36.6 C), resp. rate 18, height 5\' 4"  (1.626 m), weight 122 lb (55.3 kg), SpO2 100 %.  Physical Exam: WD in NAD Neck  Supple NROM, without any enlargements.  Lymph Node Survey No cervical supraclavicular or inguinal adenopathy Cardiovascular  Irregular rhythm. Normal rate. Lungs  No increased WOB. Skin  No rash/lesions/breakdown  Psychiatry  Alert and oriented to person, place, and time  Abdomen  Normoactive bowel sounds, abdomen soft, non-tender and thin without evidence of hernia. Back No CVA tenderness Genito Urinary  Vulva/vagina: Normal external female genitalia.  No lesions. No discharge or bleeding.  Bladder/urethra:  No lesions or masses, well supported bladder  Vagina: smooth  Adnexa: no palpable masses. Rectal  Good tone, no masses no cul de sac nodularity.  Extremities  No bilateral cyanosis, clubbing or edema.  60 minutes of total time was spent for this patient encounter, including preparation, face-to-face counseling with the patient and coordination of care, review of imaging (results and images), communication with the referring provider and documentation of the encounter.   Thereasa Solo, MD  02/07/2021, 12:57 PM

## 2021-02-07 NOTE — Patient Instructions (Signed)
Preparing for your Surgery  Plan for surgery on February 14, 2021 with Dr. Everitt Amber at [place] will be scheduled for a robotic assisted laparoscopic bilateral salpingo-oophorectomy (removal of both ovaries and fallopian tubes), possible staging (if a cancer is identified).   Pre-operative Testing -You will receive a phone call from presurgical testing at [facility] to arrange for a pre-operative appointment, labs, and COVID test. The COVID test normally happens 3 days prior to the surgery and they ask that you self quarantine after the test up until surgery to decrease chance of exposure.  -Bring your insurance card, copy of an advanced directive if applicable, medication list  -At that visit, you will be asked to sign a consent for a possible blood transfusion in case a transfusion becomes necessary during surgery.  The need for a blood transfusion is rare but having consent is a necessary part of your care.     -YOU WILL NEED TO BE OFF YOUR XARELTO FOR 3 FULL DAYS (Last dose on February 10, 2021) BEFORE SURGERY AND FOR 7 DAYS AFTER SURGERY.  -Do not take supplements such as fish oil (omega 3), red yeast rice, turmeric before your surgery. You want to avoid medications with aspirin in them including headache powders such as BC or Goody's), Excedrin migraine.  Day Before Surgery at [facility] will be asked to take in a light diet the day before surgery. You will be advised you can have clear liquids up until 3 hours before your surgery.    Eat a light diet the day before surgery.  Examples including soups, broths, toast, yogurt, mashed potatoes.  AVOID GAS PRODUCING FOODS. Things to avoid include carbonated beverages (fizzy beverages, sodas), raw fruits and raw vegetables (uncooked), or beans.   If your bowels are filled with gas, your surgeon will have difficulty visualizing your pelvic organs which increases your surgical risks.  Your role in recovery Your role is to  become active as soon as directed by your doctor, while still giving yourself time to heal.  Rest when you feel tired. You will be asked to do the following in order to speed your recovery:  - Cough and breathe deeply. This helps to clear and expand your lungs and can prevent pneumonia after surgery.  - [place]. Do mild physical activity. Walking or moving your legs help your circulation and body functions return to normal. Do not try to get up or walk alone the first time after surgery.   -If you develop swelling on one leg or the other, pain in the back of your leg, redness/warmth in one of your legs, please call the office or go to the Emergency Room to have a doppler to rule out a blood clot. For shortness of breath, chest pain-seek care in the Emergency Room as soon as possible. - Actively manage your pain. Managing your pain lets you move in comfort. We will ask you to rate your pain on a scale of zero to 10. It is your responsibility to tell your doctor or nurse where and how much you hurt so your pain can be treated.  Special Considerations -If you are diabetic, you may be placed on insulin after surgery to have closer control over your blood sugars to promote healing and recovery.  This does not mean that you will be discharged on insulin.  If applicable, your oral antidiabetics will be resumed when you are tolerating a solid diet.  -  Your final pathology results from surgery should be available around one week after surgery and the results will be relayed to you when available.  -Dr. Lahoma Crocker is the surgeon that assists your GYN Oncologist with surgery.  If you end up staying the night, the next day after your surgery you will either see Dr. Denman George, Dr. Berline Lopes, or Dr. Lahoma Crocker.  -FMLA forms can be faxed to [phone] and please allow 5-7 business days for completion.  Pain Management After Surgery -You have been prescribed your pain medication and  bowel regimen medications before surgery so that you can have these available when you are discharged from the hospital. The pain medication is for use ONLY AFTER surgery and a new prescription will not be given.   -Make sure that you have Tylenol at home to use on a regular basis after surgery for pain control.   -Review the attached handout on narcotic use and their risks and side effects.   Bowel Regimen -You have been prescribed Sennakot-S to take nightly to prevent constipation especially if you are taking the narcotic pain medication intermittently.  It is important to prevent constipation and drink adequate amounts of liquids. You can stop taking this medication when you are not taking pain medication and you are back on your normal bowel routine.  Risks of Surgery Risks of surgery are low but include bleeding, infection, damage to surrounding structures, re-operation, blood clots, and very rarely death.   Blood Transfusion Information (For the consent to be signed before surgery)  We will be checking your blood type before surgery so in case of emergencies, we will know what type of blood you would need.                                            WHAT IS A BLOOD TRANSFUSION?  A transfusion is the replacement of blood or some of its parts. Blood is made up of multiple cells which provide different functions.  Red blood cells carry oxygen and are used for blood loss replacement.  White blood cells fight against infection.  Platelets control bleeding.  Plasma helps clot blood.  Other blood products are available for specialized needs, such as hemophilia or other clotting disorders. BEFORE THE TRANSFUSION  Who gives blood for transfusions?   You may be able to donate blood to be used at a later date on yourself (autologous donation).  Relatives can be asked to donate blood. This is generally not any safer than if you have received blood from a stranger. The same precautions are  taken to ensure safety when a relative's blood is donated.  Healthy volunteers who are fully evaluated to make sure their blood is safe. This is blood bank blood. Transfusion therapy is the safest it has ever been in the practice of medicine. Before blood is taken from a donor, a complete history is taken to make sure that person has no history of diseases nor engages in risky social behavior (examples are intravenous drug use or sexual activity with multiple partners). The donor's travel history is screened to minimize risk of transmitting infections, such as malaria. The donated blood is tested for signs of infectious diseases, such as HIV and hepatitis. The blood is then tested to be sure it is compatible with you in order to minimize the chance of a transfusion reaction. If you or  a relative donates blood, this is often done in anticipation of surgery and is not appropriate for emergency situations. It takes many days to process the donated blood. RISKS AND COMPLICATIONS Although transfusion therapy is very safe and saves many lives, the main dangers of transfusion include:   Getting an infectious disease.  Developing a transfusion reaction. This is an allergic reaction to something in the blood you were given. Every precaution is taken to prevent this. The decision to have a blood transfusion has been considered carefully by your caregiver before blood is given. Blood is not given unless the benefits outweigh the risks.  AFTER SURGERY INSTRUCTIONS  Return to work: 4 weeks if applicable  Activity: 1. Be up and out of the bed during the day.  Take a nap if needed.  You may walk up steps but be careful and use the hand rail.  Stair climbing will tire you more than you think, you may need to stop part way and rest.   2. No lifting or straining for 6 weeks over 10 pounds. No pushing, pulling, straining for 6 weeks.  3. No driving for 1 week(s).  Do not drive if you are taking narcotic pain  medicine and make sure that your reaction time has returned.   4. You can shower as soon as the next day after surgery. Shower daily.  Use your regular soap and water (not directly on the incision) and pat your incision(s) dry afterwards; don't rub.  No tub baths or submerging your body in water until cleared by your surgeon. If you have the soap that was given to you by pre-surgical testing that was used before surgery, you do not need to use it afterwards because this can irritate your incisions.   5. No sexual activity and nothing in the vagina for 4 weeks.  6. You may experience a small amount of clear drainage from your incisions, which is normal.  If the drainage persists, increases, or changes color please call the office.  7. Do not use creams, lotions, or ointments such as neosporin on your incisions after surgery until advised by your surgeon because they can cause removal of the dermabond glue on your incisions.    8. Take Tylenol first for pain and only use narcotic pain medication for severe pain not relieved by the Tylenol.  Monitor your Tylenol intake to a max of 4,000 mg in a 24 hour period. You can alternate these medications after surgery.  Diet: 1. Low sodium Heart Healthy Diet is recommended but you are cleared to resume your normal (before surgery) diet after your procedure.  2. It is safe to use a laxative, such as Miralax or Colace, if you have difficulty moving your bowels. You have been prescribed Sennakot at bedtime every evening to keep bowel movements regular and to prevent constipation.    Wound Care: 1. Keep clean and dry.  Shower daily.  Reasons to call the Doctor:  Fever - Oral temperature greater than 100.4 degrees Fahrenheit  Foul-smelling vaginal discharge  Difficulty urinating  Nausea and vomiting  Increased pain at the site of the incision that is unrelieved with pain medicine.  Difficulty breathing with or without chest pain  New calf pain  especially if only on one side  Sudden, continuing increased vaginal bleeding with or without clots.   Contacts: For questions or concerns you should contact:  Dr. Everitt Amber at [phone]  Joylene John, NP at [phone]  After Hours: call [phone] and  have the GYN Oncologist paged/contacted (after 5 pm or on the weekends).  Messages sent via mychart are for non-urgent matters and are not responded to after hours so for urgent needs, please call the after hours number.

## 2021-02-11 ENCOUNTER — Encounter: Payer: Medicare PPO | Admitting: Physical Therapy

## 2021-02-11 NOTE — Patient Instructions (Addendum)
DUE TO COVID-19 ONLY ONE VISITOR IS ALLOWED TO COME WITH YOU AND STAY IN THE WAITING ROOM ONLY DURING PRE OP AND PROCEDURE DAY OF SURGERY. THE 1 VISITOR  MAY VISIT WITH YOU AFTER SURGERY IN YOUR PRIVATE ROOM DURING VISITING HOURS ONLY!  YOU NEED TO HAVE A COVID 19 TEST ON_4/12______ @_9 :35______, THIS TEST MUST BE DONE BEFORE SURGERY,  COVID TESTING SITE [place] [id number], IT IS ON THE RIGHT GOING OUT [address] APPROXIMATELY  2 MINUTES PAST [employer] ON THE RIGHT. ONCE YOUR COVID TEST IS COMPLETED,  PLEASE BEGIN THE QUARANTINE INSTRUCTIONS AS OUTLINED IN YOUR HANDOUT.                Patricia Peck   Your procedure is scheduled on: 02/14/21   Report to [facility] Main  Entrance   Report to admitting at  8:00 AM     Call this number if you have problems the morning of surgery [phone]    Remember: Do not eat food after Midnight.  You may have clear liquids until 7:00 AM    BRUSH YOUR TEETH MORNING OF SURGERY AND RINSE YOUR MOUTH OUT, NO CHEWING GUM CANDY OR MINTS.     Take these medicines the morning of surgery with A SIP OF WATER: Metoprolol, Amlodipine, Synthroid, use your inhaler and bring with you to the hospital                                You may not have any metal on your body including hair pins and              piercings  Do not wear jewelry, make-up, lotions, powders or perfumes, deodorant             Do not wear nail polish on your fingernails.  Do not shave  48 hours prior to surgery.                Do not bring valuables to the hospital. [place].  Contacts, dentures or bridgework may not be worn into surgery.      your room. Patients discharged the day of surgery will not be allowed to drive home . IF YOU ARE HAVING SURGERY AND GOING HOME THE SAME DAY, YOU MUST HAVE AN ADULT TO DRIVE YOU HOME AND BE WITH YOU FOR 24 HOURS. YOU MAY GO HOME BY TAXI OR UBER OR  ORTHERWISE, BUT AN ADULT MUST ACCOMPANY YOU HOME AND STAY WITH YOU FOR 24 HOURS.  Name and phone number of your driver:  Special Instructions: N/A              Please read over the following fact sheets you were given: _____________________________________________________________________             [facility] - Preparing for Surgery Before surgery, you can play an important role.  Because skin is not sterile, your skin needs to be as free of germs as possible.  You can reduce the number of germs on your skin by washing with CHG (chlorahexidine gluconate) soap before surgery.  CHG is an antiseptic cleaner which kills germs and bonds with the skin to continue killing germs even after washing. Please DO NOT use if you have an allergy to CHG or antibacterial soaps.  If your skin becomes reddened/irritated stop using the CHG and inform your nurse when you arrive at Short Stay. Do not shave (including legs and underarms) for at least 48 hours prior to the first CHG shower.   Please follow these instructions carefully:  1.  Shower with CHG Soap the night before surgery and the  morning of Surgery.  2.  If you choose to wash your hair, wash your hair first as usual with your  normal  shampoo.  3.  After you shampoo, rinse your hair and body thoroughly to remove the  shampoo.                                        4.  Use CHG as you would any other liquid soap.  You can apply chg directly  to the skin and wash                       Gently with a scrungie or clean washcloth.  5.  Apply the CHG Soap to your body ONLY FROM THE NECK DOWN.   Do not use on face/ open                           Wound or open sores. Avoid contact with eyes, ears mouth and genitals (private parts).                       Wash face,  Genitals (private parts) with your normal soap.             6.  Wash thoroughly, paying special attention to the area where your surgery  will be performed.  7.  Thoroughly rinse your body with  warm water from the neck down.  8.  DO NOT shower/wash with your normal soap after using and rinsing off  the CHG Soap.             9.  Pat yourself dry with a clean towel.            10.  Wear clean pajamas.            11.  Place clean sheets on your bed the night of your first shower and do not  sleep with pets. Day of Surgery : Do not apply any lotions/deodorants the morning of surgery.  Please wear clean clothes to the hospital/surgery center.  FAILURE TO FOLLOW THESE INSTRUCTIONS MAY RESULT IN THE CANCELLATION OF YOUR SURGERY PATIENT SIGNATURE_________________________________  NURSE SIGNATURE__________________________________  ________________________________________________________________________

## 2021-02-12 ENCOUNTER — Other Ambulatory Visit: Payer: Self-pay

## 2021-02-12 ENCOUNTER — Telehealth: Payer: Self-pay | Admitting: *Deleted

## 2021-02-12 ENCOUNTER — Other Ambulatory Visit (HOSPITAL_COMMUNITY)
Admission: RE | Admit: 2021-02-12 | Discharge: 2021-02-12 | Disposition: A | Discharge status: Home / Self Care | Payer: Medicare PPO | Source: Ambulatory Visit | Attending: Gynecologic Oncology | Admitting: Gynecologic Oncology

## 2021-02-12 ENCOUNTER — Encounter (HOSPITAL_COMMUNITY): Payer: Self-pay

## 2021-02-12 ENCOUNTER — Encounter (HOSPITAL_COMMUNITY)
Admission: RE | Admit: 2021-02-12 | Discharge: 2021-02-12 | Disposition: A | Discharge status: Home / Self Care | Payer: Medicare PPO | Source: Ambulatory Visit | Attending: Gynecologic Oncology | Admitting: Gynecologic Oncology

## 2021-02-12 ENCOUNTER — Other Ambulatory Visit: Payer: Self-pay | Admitting: Gynecologic Oncology

## 2021-02-12 DIAGNOSIS — Z01812 Encounter for preprocedural laboratory examination: Secondary | ICD-10-CM | POA: Insufficient documentation

## 2021-02-12 DIAGNOSIS — D270 Benign neoplasm of right ovary: Secondary | ICD-10-CM | POA: Diagnosis present

## 2021-02-12 DIAGNOSIS — I4891 Unspecified atrial fibrillation: Secondary | ICD-10-CM | POA: Diagnosis not present

## 2021-02-12 DIAGNOSIS — I10 Essential (primary) hypertension: Secondary | ICD-10-CM | POA: Diagnosis not present

## 2021-02-12 DIAGNOSIS — Z20822 Contact with and (suspected) exposure to covid-19: Secondary | ICD-10-CM | POA: Diagnosis not present

## 2021-02-12 DIAGNOSIS — N3000 Acute cystitis without hematuria: Secondary | ICD-10-CM

## 2021-02-12 DIAGNOSIS — Z79899 Other long term (current) drug therapy: Secondary | ICD-10-CM | POA: Diagnosis not present

## 2021-02-12 LAB — CBC
HCT: 40.7 % (ref 36.0–46.0)
Hemoglobin: 13.3 g/dL (ref 12.0–15.0)
MCH: 28.7 pg (ref 26.0–34.0)
MCHC: 32.7 g/dL (ref 30.0–36.0)
MCV: 87.9 fL (ref 80.0–100.0)
Platelets: 350 10*3/uL (ref 150–400)
RBC: 4.63 MIL/uL (ref 3.87–5.11)
RDW: 13.4 % (ref 11.5–15.5)
WBC: 9.1 10*3/uL (ref 4.0–10.5)
nRBC: 0 % (ref 0.0–0.2)

## 2021-02-12 LAB — URINALYSIS, ROUTINE W REFLEX MICROSCOPIC
Bilirubin Urine: NEGATIVE
Glucose, UA: NEGATIVE mg/dL
Hgb urine dipstick: NEGATIVE
Ketones, ur: NEGATIVE mg/dL
Nitrite: POSITIVE — AB
Protein, ur: NEGATIVE mg/dL
Specific Gravity, Urine: 1.017 (ref 1.005–1.030)
pH: 5 (ref 5.0–8.0)

## 2021-02-12 LAB — BASIC METABOLIC PANEL
Anion gap: 10 (ref 5–15)
BUN: 16 mg/dL (ref 8–23)
CO2: 25 mmol/L (ref 22–32)
Calcium: 9.2 mg/dL (ref 8.9–10.3)
Chloride: 103 mmol/L (ref 98–111)
Creatinine, Ser: 0.79 mg/dL (ref 0.44–1.00)
GFR, Estimated: >60 mL/min (ref >60)
Glucose, Bld: 98 mg/dL (ref 70–99)
Potassium: 3.1 mmol/L — ABNORMAL LOW (ref 3.5–5.1)
Sodium: 138 mmol/L (ref 135–145)

## 2021-02-12 LAB — SARS CORONAVIRUS 2 (TAT 6-24 HRS): SARS Coronavirus 2: NEGATIVE

## 2021-02-12 MED ORDER — NITROFURANTOIN MONOHYD MACRO 100 MG PO CAPS: 100.0000 mg | ORAL_CAPSULE | Freq: Two times a day (BID) | ORAL | 0 refills | Status: DC

## 2021-02-12 NOTE — Telephone Encounter (Signed)
I spoke with Patricia Peck regarding the results of her urinalysis. She states that she had frequency yesterday and today her urine "seemed dark". I verified her pharmacy and let her know that a prescription was sent to her pharmacy to treat her UTI. Pt verbalized understanding

## 2021-02-12 NOTE — Telephone Encounter (Signed)
I spoke with Patricia Peck and let her know that her potassium was a litte low. She should increase her Potassium to 2 tablets twice a day just for today. Then she can go back to taking the potassium as prescribed ( 1tab twice per day). She verbalized understanding.

## 2021-02-12 NOTE — Progress Notes (Signed)
See RN note.

## 2021-02-12 NOTE — Progress Notes (Signed)
COVID Vaccine Completed:Yes Date COVID Vaccine completed:total of 4 shots last one 02/01/21 COVID vaccine manufacturer: Pfizer      PCP - [facility] Cardiologist - Dr B. Crenshaw  Chest x-ray - no EKG - 03/26/20-epic Stress Test - no ECHO - 12/07/20-epic Cardiac Cath - no Pacemaker/ICD device last checked:NA  Sleep Study -no  CPAP -   Fasting Blood Sugar - NA Checks Blood Sugar _____ times a day  Blood Thinner Instructions:Xarelto/ Dr. Stanford Breed Aspirin Instructions:Stop 3 days prior to DOS/ Dr. Denman George Last Dose:02/10/21  Anesthesia review:   Patient denies shortness of breath, fever, cough and chest pain at PAT appointment yes  Patient verbalized understanding of instructions that were given to them at the PAT appointment. Patient was also instructed that they will need to review over the PAT instructions again at home before surgery.yes  Pt is very nervious about surgery. She reports no SOB with activities. She had stopped taking her diaretic because it was making her urinate"too mush " I told her to continue it and call her DrMarland Kitchen

## 2021-02-13 ENCOUNTER — Telehealth: Payer: Self-pay | Admitting: Family Medicine

## 2021-02-13 ENCOUNTER — Telehealth: Payer: Self-pay | Admitting: Cardiology

## 2021-02-13 ENCOUNTER — Other Ambulatory Visit: Payer: Self-pay

## 2021-02-13 ENCOUNTER — Telehealth: Payer: Self-pay | Admitting: *Deleted

## 2021-02-13 DIAGNOSIS — E7800 Pure hypercholesterolemia, unspecified: Secondary | ICD-10-CM

## 2021-02-13 DIAGNOSIS — I480 Paroxysmal atrial fibrillation: Secondary | ICD-10-CM

## 2021-02-13 MED ORDER — LEVOTHYROXINE SODIUM 50 MCG PO TABS: 50.0000 ug | ORAL_TABLET | Freq: Every day | ORAL | 0 refills | Status: DC

## 2021-02-13 MED ORDER — ROPINIROLE HCL 0.25 MG PO TABS: 0.2500 mg | ORAL_TABLET | Freq: Every day | ORAL | 1 refills | Status: DC

## 2021-02-13 MED ORDER — METOPROLOL SUCCINATE ER 50 MG PO TB24: 50.0000 mg | ORAL_TABLET | Freq: Every day | ORAL | 2 refills | Status: DC

## 2021-02-13 MED ORDER — POTASSIUM CHLORIDE ER 10 MEQ PO TBCR: 10.0000 meq | EXTENDED_RELEASE_TABLET | Freq: Two times a day (BID) | ORAL | 1 refills | Status: DC

## 2021-02-13 MED ORDER — ATORVASTATIN CALCIUM 80 MG PO TABS: 80.0000 mg | ORAL_TABLET | Freq: Every day | ORAL | 3 refills | Status: DC

## 2021-02-13 MED ORDER — NEXIUM 40 MG PO CPDR: 40.0000 mg | DELAYED_RELEASE_CAPSULE | Freq: Every day | ORAL | 0 refills | Status: DC

## 2021-02-13 MED ORDER — EZETIMIBE 10 MG PO TABS: 10.0000 mg | ORAL_TABLET | Freq: Every day | ORAL | 3 refills | Status: DC

## 2021-02-13 MED ORDER — LEVOCETIRIZINE DIHYDROCHLORIDE 5 MG PO TABS: 5.0000 mg | ORAL_TABLET | Freq: Every day | ORAL | 1 refills | Status: DC

## 2021-02-13 MED ORDER — IPRATROPIUM BROMIDE 0.06 % NA SOLN: 2.0000 | Freq: Three times a day (TID) | NASAL | 1 refills | Status: DC

## 2021-02-13 MED ORDER — AMLODIPINE BESYLATE 5 MG PO TABS: 5.0000 mg | ORAL_TABLET | Freq: Every day | ORAL | 2 refills | Status: DC

## 2021-02-13 NOTE — Telephone Encounter (Signed)
Yes patient should now be on Xarelto 20mg  once daily. However can we clarify what "immediate surgery" is?  Does Xarelto need to be held?

## 2021-02-13 NOTE — Telephone Encounter (Signed)
Pt's husband and pt are calling in concerned over several medication refills. She is having surgery tomorrow 02/14/21 and she wants to make sure everything is in line. I asked her which medications and she said-All the ones getting ready to expire. Please advise pt at [phone].

## 2021-02-13 NOTE — Telephone Encounter (Signed)
Pt requesting refill of xarelto15mg  but qualifies fo 20mg . Will route to pharmd pool. Refill to be sent to [facility], [address] AT [address]  81f, 55.3kg, scr 0.79 02/12/21, lovw/crenshaw 10/18/20. ccr 53.7

## 2021-02-13 NOTE — Telephone Encounter (Signed)
Last OV 11/15/20

## 2021-02-13 NOTE — Telephone Encounter (Signed)
*  STAT* If patient is at the pharmacy, call can be transferred to refill team.   1. Which medications need to be refilled? (please list name of each medication and dose if known)  amLODipine (NORVASC) 5 MG tablet(Expired); atorvastatin (LIPITOR) 80 MG tablet; ezetimibe (ZETIA) 10 MG tablet(Expired); metoprolol succinate (TOPROL-XL) 50 MG 24 hr tablet; XARELTO 15 MG TABS tablet  2. Which pharmacy/location (including street and city if local pharmacy) is medication to be sent to?  [id number] - [place], [place] - [address] AT [address]  3. Do they need a 30 day or 90 day supply? 90 for all  Patient is having immediate surgery on 02/13/21 and needs medication

## 2021-02-13 NOTE — Telephone Encounter (Signed)
PC to patient to review pre op instructions for surgery tomorrow.  Pt instructed to arrive at 0800, may have clear liquids until 0700 but NPO after that.  Also instructed pt to take her metoprolol, amlodopine & synthroid with a sip of water only in the morning.  She is to use her inhaler in the morning & take it with her to the hospital.  Patient reminded to do CHG shower tonight & in the morning.  Surgery phone number given.  Patient verbalizes understanding of all instructions.

## 2021-02-13 NOTE — Telephone Encounter (Signed)
I spoke with pt and informed her that I would change pharmacy in chart upon her request and send in medications requested today to new pharmacy.

## 2021-02-14 ENCOUNTER — Ambulatory Visit (HOSPITAL_COMMUNITY): Payer: Medicare PPO | Admitting: Certified Registered Nurse Anesthetist

## 2021-02-14 ENCOUNTER — Ambulatory Visit (HOSPITAL_COMMUNITY): Payer: Medicare PPO | Admitting: Physician Assistant

## 2021-02-14 ENCOUNTER — Ambulatory Visit (HOSPITAL_COMMUNITY)
Admission: RE | Admit: 2021-02-14 | Discharge: 2021-02-14 | Disposition: A | Discharge status: Home / Self Care | Payer: Medicare PPO | Attending: Gynecologic Oncology | Admitting: Gynecologic Oncology

## 2021-02-14 ENCOUNTER — Encounter (HOSPITAL_COMMUNITY): Payer: Self-pay | Admitting: Gynecologic Oncology

## 2021-02-14 ENCOUNTER — Encounter (HOSPITAL_COMMUNITY)
Admission: RE | Disposition: A | Discharge status: Home / Self Care | Payer: Self-pay | Source: Home / Self Care | Attending: Gynecologic Oncology

## 2021-02-14 DIAGNOSIS — N83202 Unspecified ovarian cyst, left side: Secondary | ICD-10-CM | POA: Diagnosis not present

## 2021-02-14 DIAGNOSIS — I4891 Unspecified atrial fibrillation: Secondary | ICD-10-CM | POA: Diagnosis not present

## 2021-02-14 DIAGNOSIS — N390 Urinary tract infection, site not specified: Secondary | ICD-10-CM | POA: Diagnosis present

## 2021-02-14 DIAGNOSIS — Z79899 Other long term (current) drug therapy: Secondary | ICD-10-CM | POA: Insufficient documentation

## 2021-02-14 DIAGNOSIS — I10 Essential (primary) hypertension: Secondary | ICD-10-CM | POA: Diagnosis not present

## 2021-02-14 DIAGNOSIS — N3090 Cystitis, unspecified without hematuria: Secondary | ICD-10-CM | POA: Diagnosis present

## 2021-02-14 DIAGNOSIS — D270 Benign neoplasm of right ovary: Secondary | ICD-10-CM | POA: Diagnosis not present

## 2021-02-14 DIAGNOSIS — N83201 Unspecified ovarian cyst, right side: Secondary | ICD-10-CM | POA: Diagnosis not present

## 2021-02-14 DIAGNOSIS — E039 Hypothyroidism, unspecified: Secondary | ICD-10-CM | POA: Diagnosis not present

## 2021-02-14 DIAGNOSIS — E785 Hyperlipidemia, unspecified: Secondary | ICD-10-CM | POA: Diagnosis not present

## 2021-02-14 HISTORY — PX: ROBOTIC ASSISTED BILATERAL SALPINGO OOPHERECTOMY: SHX6078

## 2021-02-14 LAB — TYPE AND SCREEN
ABO/RH(D): AB NEG
Antibody Screen: NEGATIVE

## 2021-02-14 LAB — URINE CULTURE: Culture: >=100000 — AB

## 2021-02-14 LAB — ABO/RH: ABO/RH(D): AB NEG

## 2021-02-14 SURGERY — SALPINGO-OOPHORECTOMY, BILATERAL, ROBOT-ASSISTED
Anesthesia: General | Laterality: Bilateral

## 2021-02-14 MED ORDER — CEFAZOLIN SODIUM-DEXTROSE 2-4 GM/100ML-% IV SOLN
2.0000 g | INTRAVENOUS | Status: AC
  Administered 2021-02-14: 2 g via INTRAVENOUS
  Filled 2021-02-14: qty 100

## 2021-02-14 MED ORDER — FENTANYL CITRATE (PF) 250 MCG/5ML IJ SOLN
INTRAMUSCULAR | Status: AC
  Filled 2021-02-14: qty 5

## 2021-02-14 MED ORDER — ESMOLOL HCL 100 MG/10ML IV SOLN
INTRAVENOUS | Status: AC
  Filled 2021-02-14: qty 10

## 2021-02-14 MED ORDER — SUGAMMADEX SODIUM 200 MG/2ML IV SOLN
INTRAVENOUS | Status: DC | PRN
  Administered 2021-02-14: 200 mg via INTRAVENOUS

## 2021-02-14 MED ORDER — DEXAMETHASONE SODIUM PHOSPHATE 4 MG/ML IJ SOLN: 4.0000 mg | INTRAMUSCULAR | Status: DC

## 2021-02-14 MED ORDER — FENTANYL CITRATE (PF) 100 MCG/2ML IJ SOLN
INTRAMUSCULAR | Status: AC
  Filled 2021-02-14: qty 2

## 2021-02-14 MED ORDER — AMISULPRIDE (ANTIEMETIC) 5 MG/2ML IV SOLN
INTRAVENOUS | Status: DC | PRN
  Administered 2021-02-14: 5 mg via INTRAVENOUS

## 2021-02-14 MED ORDER — PHENYLEPHRINE HCL-NACL 10-0.9 MG/250ML-% IV SOLN
INTRAVENOUS | Status: DC | PRN
  Administered 2021-02-14: 35 ug/min via INTRAVENOUS

## 2021-02-14 MED ORDER — PROPOFOL 10 MG/ML IV BOLUS
INTRAVENOUS | Status: DC | PRN
  Administered 2021-02-14: 100 mg via INTRAVENOUS

## 2021-02-14 MED ORDER — PROPOFOL 10 MG/ML IV BOLUS
INTRAVENOUS | Status: AC
  Filled 2021-02-14: qty 20

## 2021-02-14 MED ORDER — LIDOCAINE 2% (20 MG/ML) 5 ML SYRINGE
INTRAMUSCULAR | Status: DC | PRN
  Administered 2021-02-14: 1 mg/kg/h via INTRAVENOUS

## 2021-02-14 MED ORDER — DEXAMETHASONE SODIUM PHOSPHATE 10 MG/ML IJ SOLN
INTRAMUSCULAR | Status: DC | PRN
  Administered 2021-02-14: 5 mg via INTRAVENOUS

## 2021-02-14 MED ORDER — BUPIVACAINE HCL 0.25 % IJ SOLN
INTRAMUSCULAR | Status: DC | PRN
  Administered 2021-02-14: 22 mL

## 2021-02-14 MED ORDER — SODIUM CHLORIDE 0.9% FLUSH: 3.0000 mL | Freq: Two times a day (BID) | INTRAVENOUS | Status: DC

## 2021-02-14 MED ORDER — RIVAROXABAN 20 MG PO TABS: 20.0000 mg | ORAL_TABLET | Freq: Every day | ORAL | 1 refills | Status: DC

## 2021-02-14 MED ORDER — ROCURONIUM BROMIDE 10 MG/ML (PF) SYRINGE
PREFILLED_SYRINGE | INTRAVENOUS | Status: AC
  Filled 2021-02-14: qty 10

## 2021-02-14 MED ORDER — LIDOCAINE 2% (20 MG/ML) 5 ML SYRINGE
INTRAMUSCULAR | Status: AC
  Filled 2021-02-14: qty 5

## 2021-02-14 MED ORDER — BUPIVACAINE HCL 0.25 % IJ SOLN
INTRAMUSCULAR | Status: AC
  Filled 2021-02-14: qty 1

## 2021-02-14 MED ORDER — ESMOLOL HCL 100 MG/10ML IV SOLN
INTRAVENOUS | Status: DC | PRN
  Administered 2021-02-14: 50 mg via INTRAVENOUS

## 2021-02-14 MED ORDER — ONDANSETRON HCL 4 MG/2ML IJ SOLN: 4.0000 mg | Freq: Once | INTRAMUSCULAR | Status: DC | PRN

## 2021-02-14 MED ORDER — LACTATED RINGERS IR SOLN
Status: DC | PRN
  Administered 2021-02-14: 1000 mL

## 2021-02-14 MED ORDER — PHENYLEPHRINE HCL (PRESSORS) 10 MG/ML IV SOLN
INTRAVENOUS | Status: AC
  Filled 2021-02-14: qty 1

## 2021-02-14 MED ORDER — ONDANSETRON HCL 4 MG/2ML IJ SOLN
INTRAMUSCULAR | Status: AC
  Filled 2021-02-14: qty 2

## 2021-02-14 MED ORDER — AMISULPRIDE (ANTIEMETIC) 5 MG/2ML IV SOLN
INTRAVENOUS | Status: AC
  Filled 2021-02-14: qty 2

## 2021-02-14 MED ORDER — DEXAMETHASONE SODIUM PHOSPHATE 10 MG/ML IJ SOLN
INTRAMUSCULAR | Status: AC
  Filled 2021-02-14: qty 1

## 2021-02-14 MED ORDER — ONDANSETRON HCL 4 MG/2ML IJ SOLN
INTRAMUSCULAR | Status: DC | PRN
  Administered 2021-02-14: 4 mg via INTRAVENOUS

## 2021-02-14 MED ORDER — ROCURONIUM BROMIDE 10 MG/ML (PF) SYRINGE
PREFILLED_SYRINGE | INTRAVENOUS | Status: DC | PRN
  Administered 2021-02-14: 10 mg via INTRAVENOUS
  Administered 2021-02-14: 40 mg via INTRAVENOUS

## 2021-02-14 MED ORDER — FENTANYL CITRATE (PF) 100 MCG/2ML IJ SOLN
25.0000 ug | INTRAMUSCULAR | Status: DC | PRN
  Administered 2021-02-14 (×2): 25 ug via INTRAVENOUS

## 2021-02-14 MED ORDER — ACETAMINOPHEN 500 MG PO TABS
1000.0000 mg | ORAL_TABLET | ORAL | Status: AC
  Administered 2021-02-14: 1000 mg via ORAL
  Filled 2021-02-14: qty 2

## 2021-02-14 MED ORDER — LIDOCAINE 2% (20 MG/ML) 5 ML SYRINGE
INTRAMUSCULAR | Status: DC | PRN
  Administered 2021-02-14: 60 mg via INTRAVENOUS

## 2021-02-14 MED ORDER — LACTATED RINGERS IV SOLN: INTRAVENOUS | Status: DC

## 2021-02-14 MED ORDER — STERILE WATER FOR IRRIGATION IR SOLN
Status: DC | PRN
  Administered 2021-02-14: 1000 mL

## 2021-02-14 MED ORDER — AMISULPRIDE (ANTIEMETIC) 5 MG/2ML IV SOLN: 5.0000 mg | Freq: Once | INTRAVENOUS | Status: DC | PRN

## 2021-02-14 MED ORDER — FENTANYL CITRATE (PF) 250 MCG/5ML IJ SOLN
INTRAMUSCULAR | Status: DC | PRN
  Administered 2021-02-14: 50 ug via INTRAVENOUS
  Administered 2021-02-14: 150 ug via INTRAVENOUS
  Administered 2021-02-14: 25 ug via INTRAVENOUS

## 2021-02-14 SURGICAL SUPPLY — 72 items
APPLICATOR SURGIFLO ENDO (HEMOSTASIS) IMPLANT
BACTOSHIELD CHG 4% 4OZ (MISCELLANEOUS) ×1
BAG LAPAROSCOPIC 12 15 PORT 16 (BASKET) IMPLANT
BAG RETRIEVAL 12/15 (BASKET)
BLADE SURG SZ10 CARB STEEL (BLADE) IMPLANT
CELLS DAT CNTRL 66122 CELL SVR (MISCELLANEOUS) IMPLANT
COVER BACK TABLE 60X90IN (DRAPES) ×2 IMPLANT
COVER TIP SHEARS 8 DVNC (MISCELLANEOUS) ×1 IMPLANT
COVER TIP SHEARS 8MM DA VINCI (MISCELLANEOUS) ×2
COVER WAND RF STERILE (DRAPES) IMPLANT
DECANTER SPIKE VIAL GLASS SM (MISCELLANEOUS) IMPLANT
DERMABOND ADVANCED (GAUZE/BANDAGES/DRESSINGS) ×1
DERMABOND ADVANCED .7 DNX12 (GAUZE/BANDAGES/DRESSINGS) ×1 IMPLANT
DRAPE ARM DVNC X/XI (DISPOSABLE) ×4 IMPLANT
DRAPE COLUMN DVNC XI (DISPOSABLE) ×1 IMPLANT
DRAPE DA VINCI XI ARM (DISPOSABLE) ×8
DRAPE DA VINCI XI COLUMN (DISPOSABLE) ×2
DRAPE SHEET LG 3/4 BI-LAMINATE (DRAPES) ×2 IMPLANT
DRAPE SURG IRRIG POUCH 19X23 (DRAPES) ×2 IMPLANT
DRSG OPSITE POSTOP 4X6 (GAUZE/BANDAGES/DRESSINGS) IMPLANT
DRSG OPSITE POSTOP 4X8 (GAUZE/BANDAGES/DRESSINGS) IMPLANT
ELECT PENCIL ROCKER SW 15FT (MISCELLANEOUS) IMPLANT
ELECT REM PT RETURN 15FT ADLT (MISCELLANEOUS) ×2 IMPLANT
GLOVE SURG ENC MOIS LTX SZ6 (GLOVE) ×8 IMPLANT
GLOVE SURG ENC MOIS LTX SZ6.5 (GLOVE) ×4 IMPLANT
GOWN STRL REUS W/ TWL LRG LVL3 (GOWN DISPOSABLE) ×4 IMPLANT
GOWN STRL REUS W/TWL LRG LVL3 (GOWN DISPOSABLE) ×8
HOLDER FOLEY CATH W/STRAP (MISCELLANEOUS) IMPLANT
IRRIG SUCT STRYKERFLOW 2 WTIP (MISCELLANEOUS) ×2
IRRIGATION SUCT STRKRFLW 2 WTP (MISCELLANEOUS) ×1 IMPLANT
KIT PROCEDURE DA VINCI SI (MISCELLANEOUS)
KIT PROCEDURE DVNC SI (MISCELLANEOUS) IMPLANT
KIT TURNOVER KIT A (KITS) ×2 IMPLANT
MANIPULATOR UTERINE 4.5 ZUMI (MISCELLANEOUS) ×2 IMPLANT
NEEDLE HYPO 22GX1.5 SAFETY (NEEDLE) ×2 IMPLANT
NEEDLE SPNL 18GX3.5 QUINCKE PK (NEEDLE) IMPLANT
OBTURATOR OPTICAL STANDARD 8MM (TROCAR) ×2
OBTURATOR OPTICAL STND 8 DVNC (TROCAR) ×1
OBTURATOR OPTICALSTD 8 DVNC (TROCAR) ×1 IMPLANT
PACK ROBOT GYN CUSTOM WL (TRAY / TRAY PROCEDURE) ×2 IMPLANT
PAD POSITIONING PINK XL (MISCELLANEOUS) ×2 IMPLANT
PORT ACCESS TROCAR AIRSEAL 12 (TROCAR) ×1 IMPLANT
PORT ACCESS TROCAR AIRSEAL 5M (TROCAR) ×1
POUCH SPECIMEN RETRIEVAL 10MM (ENDOMECHANICALS) ×4 IMPLANT
RETRACTOR WND ALEXIS 25 LRG (MISCELLANEOUS) IMPLANT
RTRCTR WOUND ALEXIS 18CM MED (MISCELLANEOUS)
RTRCTR WOUND ALEXIS 25CM LRG (MISCELLANEOUS)
SCRUB CHG 4% DYNA-HEX 4OZ (MISCELLANEOUS) ×1 IMPLANT
SEAL CANN UNIV 5-8 DVNC XI (MISCELLANEOUS) ×3 IMPLANT
SEAL XI 5MM-8MM UNIVERSAL (MISCELLANEOUS) ×6
SET TRI-LUMEN FLTR TB AIRSEAL (TUBING) ×2 IMPLANT
SPONGE LAP 18X18 RF (DISPOSABLE) IMPLANT
SURGIFLO W/THROMBIN 8M KIT (HEMOSTASIS) IMPLANT
SUT MNCRL AB 4-0 PS2 18 (SUTURE) IMPLANT
SUT PDS AB 1 TP1 96 (SUTURE) IMPLANT
SUT VIC AB 0 CT1 27 (SUTURE) ×2
SUT VIC AB 0 CT1 27XBRD ANTBC (SUTURE) ×1 IMPLANT
SUT VIC AB 2-0 CT1 27 (SUTURE)
SUT VIC AB 2-0 CT1 TAPERPNT 27 (SUTURE) IMPLANT
SUT VIC AB 4-0 PS2 18 (SUTURE) ×4 IMPLANT
SUT VLOC 180 0 9IN  GS21 (SUTURE) ×2
SUT VLOC 180 0 9IN GS21 (SUTURE) ×1 IMPLANT
SYR 10ML LL (SYRINGE) IMPLANT
SYR 20ML LL LF (SYRINGE) IMPLANT
SYR 50ML LL SCALE MARK (SYRINGE) IMPLANT
TOWEL OR NON WOVEN STRL DISP B (DISPOSABLE) ×2 IMPLANT
TRAP SPECIMEN MUCUS 40CC (MISCELLANEOUS) IMPLANT
TRAY FOLEY MTR SLVR 16FR STAT (SET/KITS/TRAYS/PACK) ×2 IMPLANT
TROCAR XCEL NON-BLD 5MMX100MML (ENDOMECHANICALS) IMPLANT
UNDERPAD 30X36 HEAVY ABSORB (UNDERPADS AND DIAPERS) ×2 IMPLANT
WATER STERILE IRR 1000ML POUR (IV SOLUTION) ×2 IMPLANT
YANKAUER SUCT BULB TIP 10FT TU (MISCELLANEOUS) IMPLANT

## 2021-02-14 NOTE — Anesthesia Preprocedure Evaluation (Addendum)
Anesthesia Evaluation  Patient identified by MRN, date of birth, ID band Patient awake    Reviewed: Allergy & Precautions, NPO status , Patient's Chart, lab work & pertinent test results  Airway Mallampati: III  TM Distance: >3 FB Neck ROM: Full    Dental no notable dental hx.    Pulmonary asthma ,    Pulmonary exam normal breath sounds clear to auscultation       Cardiovascular hypertension, Pt. on medications and Pt. on home beta blockers + Peripheral Vascular Disease  Normal cardiovascular exam+ dysrhythmias Atrial Fibrillation  Rhythm:Regular Rate:Normal  ECG: rate 85  ECHO: 1. Left ventricular ejection fraction, by estimation, is 55 to 60%. The left ventricle has normal function. The left ventricle has no regional wall motion abnormalities. Left ventricular diastolic parameters are indeterminate. 2. Right ventricular systolic function is normal. The right ventricular size is normal. 3. The mitral valve is grossly normal. Trivial mitral valve regurgitation. 4. The aortic valve is tricuspid. Aortic valve regurgitation is mild. No aortic stenosis is present. 5. There is borderline dilatation of the ascending aorta, measuring 37 mm. 6. The inferior vena cava is normal in size with greater than 50% respiratory variability, suggesting right atrial pressure of 3 mmHg.   Neuro/Psych PSYCHIATRIC DISORDERS Anxiety Depression TIACVA, No Residual Symptoms    GI/Hepatic Neg liver ROS, GERD  Medicated and Controlled,  Endo/Other  Hypothyroidism   Renal/GU negative Renal ROS     Musculoskeletal  (+) Arthritis ,   Abdominal   Peds  Hematology negative hematology ROS (+)   Anesthesia Other Findings RIGHT OVARIAN CYST  Reproductive/Obstetrics                            Anesthesia Physical Anesthesia Plan  ASA: III  Anesthesia Plan: General   Post-op Pain Management:    Induction:  Intravenous  PONV Risk Score and Plan: 4 or greater and Treatment may vary due to age or medical condition, Ondansetron, Dexamethasone and Amisulpride  Airway Management Planned: Oral ETT  Additional Equipment:   Intra-op Plan:   Post-operative Plan: Extubation in OR  Informed Consent: I have reviewed the patients History and Physical, chart, labs and discussed the procedure including the risks, benefits and alternatives for the proposed anesthesia with the patient or authorized representative who has indicated his/her understanding and acceptance.     Dental advisory given  Plan Discussed with: CRNA  Anesthesia Plan Comments:         Anesthesia Quick Evaluation

## 2021-02-14 NOTE — Anesthesia Postprocedure Evaluation (Signed)
Anesthesia Post Note  Patient: Patricia Peck  Procedure(s) Performed: XI ROBOTIC ASSISTED BILATERAL SALPINGO OOPHORECTOMY (Bilateral )     Patient location during evaluation: PACU Anesthesia Type: General Level of consciousness: awake Pain management: pain level controlled Vital Signs Assessment: post-procedure vital signs reviewed and stable Respiratory status: spontaneous breathing, nonlabored ventilation, respiratory function stable and patient connected to nasal cannula oxygen Cardiovascular status: blood pressure returned to baseline and stable Postop Assessment: no apparent nausea or vomiting Anesthetic complications: no   No complications documented.  Last Vitals:  Vitals:   02/14/21 1308 02/14/21 1330  BP: (!) 159/53 (!) 149/63  Pulse: 67 60  Resp: 16 18  Temp: (!) 36.1 C   SpO2: 96% 97%    Last Pain:  Vitals:   02/14/21 1330  TempSrc:   PainSc: 3                  Zari Cly P Maxamillion Banas

## 2021-02-14 NOTE — Op Note (Signed)
OPERATIVE NOTE  Date: 02/14/21  Preoperative Diagnosis: right ovarian cyst   Postoperative Diagnosis:  Right ovarian dermoid cyst (benign)  Procedure(s) Performed: Robotic-assisted laparoscopic bilateral salpingo-oophorectomy  Surgeon: Everitt Amber, M.D.  Assistant Surgeon: Lahoma Crocker M.D. (an MD assistant was necessary for tissue manipulation, management of robotic instrumentation, retraction and positioning due to the complexity of the case and hospital policies).   Anesthesia: Gen. endotracheal.  Specimens: Bilateral ovaries, fallopian tubes, pelvic washings  Estimated Blood Loss: <10 mL. Blood Replacement: None  Complications: none  Indication for Procedure:  Incidental finding of right ovarian 6cm mass on [place]. Normal CA 125. Complex features on on [place].  Operative Findings: 6cm right ovarian cyst (with contents consistent with mature teratoma). Normal appearing left ovary and tube.  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  Procedure: The patient's taken to the operating room and placed under general endotracheal anesthesia testing difficulty. She is placed in a dorsolithotomy position and cervical acromial pad was placed. The arms were tucked with care taken to pad the olecranon process. And prepped and draped in usual sterile fashion. A foley was placed with aseptic technique. A 10mm incision was made in the left upper quadrant palmer's point and a 5 mm Optiview trocar used to enter the abdomen under direct visualization. With entry into the abdomen and then maintenance of 15 mm of mercury the patient was placed in Trendelenburg position. An incision was made in the umbilicus and an 5mm trochar was placed through this site. Two incisions were made lateral to the umbilical incision in the left and right abdomen measuring 33mm. These incisions were made approximately 10 cm lateral to the umbilical incision. 8 mm robotic trochars were inserted. The  robot was docked.  The abdomen was inspected as was the pelvis.  Pelvic washings were obtained. An incision was made on the right pelvic side wall peritoneum parallel to the IP ligament and the retroperitoneal space entered. The right ureter was identified and the para-rectal space was developed. A window was created in the right broad ligament above the ureter. The right infundibulopelvic vessels were skeletonized cauterized and transected. The ovarian attachments to the residual round ligament were similarly were cauterized and transected. Specimen was placed in an Endo Catch bag.  The left pelvic sidewall was inspected and, despite her history of possibly having a left oophorectomy at the time of hysterectomy, a normal appearing left tube and ovary were seen. In a similar manner to the right, the left peritoneum and the side wall was incised, and the retroperitoneal space entered. The left ureter was identified and the left pararectal space was developed. The utero-ovarian ligament was skeletonized cauterized and transected. The left attachments to the residual round ligament were cauterized and transected in the left adnexa was placed in an Endo Catch bag.  The abdomen was copiously irrigated and drained and all operative sites inspected and hemostasis was assured  The robot was undocked. The camera was placed through the left upper abdominal incision. The contents of the left Endo Catch bag were first aspirated and then morcellated to facilitate removal from the abdominal cavity through the umbilical incision. In a similar fashion the contents of the right Endo Catch bag or morcellated to facilitate removal from the abdominal cavity.  The ports were all remove. The fascial closure at the umbilical incision and left upper quadrant port was made with 0 Vicryl.  All incisions were closed with a running subcuticular Monocryl suture. Dermabond was applied.  Sponge, lap and needle counts were correct x 3.     The patient had sequential compression devices for VTE prophylaxis.         Disposition: PACU -stable         Condition: stable  Donaciano Eva, MD

## 2021-02-14 NOTE — Telephone Encounter (Signed)
Looks like surgery is happening today so there is no possibility of holding her Xarelto.  Please go ahead and send the 20 mg and then inform patient of change

## 2021-02-14 NOTE — Discharge Instructions (Signed)
02/14/2021  Return to work: 4 weeks  Activity: 1. Be up and out of the bed during the day.  Take a nap if needed.  You may walk up steps but be careful and use the hand rail.  Stair climbing will tire you more than you think, you may need to stop part way and rest.   2. No lifting or straining for 6 weeks.  3. No driving for 1 week.  Do Not drive if you are taking narcotic pain medicine.  4. Shower daily.  Use soap and water on your incision and pat dry; don't rub.   5. No sexual activity and nothing in the vagina for 2 weeks.  Medications:  - Take ibuprofen and tylenol first line for pain control. Take these regularly (every 6 hours) to decrease the build up of pain.  - If necessary, for severe pain not relieved by ibuprofen, take oxycodone.  - While taking oxycodone you should take sennakot every night to reduce the likelihood of constipation. If this causes diarrhea, stop its use.  - continue to not take your xarelto blood thinner for 1 week after surgery. You may then restart it.   Diet: 1. Low sodium Heart Healthy Diet is recommended.  2. It is safe to use a laxative if you have difficulty moving your bowels.   Wound Care: 1. Keep clean and dry.  Shower daily.  Reasons to call the Doctor:   Fever - Oral temperature greater than 100.4 degrees Fahrenheit  Foul-smelling vaginal discharge  Difficulty urinating  Nausea and vomiting  Increased pain at the site of the incision that is unrelieved with pain medicine.  Difficulty breathing with or without chest pain  New calf pain especially if only on one side  Sudden, continuing increased vaginal bleeding with or without clots.   Follow-up: 1. See Everitt Amber in 3 weeks.  Contacts: For questions or concerns you should contact:  Dr. Everitt Amber at [phone] After hours and on week-ends call [phone] and ask to speak to the physician on call for Gynecologic Oncology

## 2021-02-14 NOTE — Anesthesia Procedure Notes (Signed)
Procedure Name: Intubation Date/Time: 02/14/2021 10:29 AM Performed by: Maxwell Caul, CRNA Pre-anesthesia Checklist: Patient identified, Emergency Drugs available, Suction available and Patient being monitored Patient Re-evaluated:Patient Re-evaluated prior to induction Oxygen Delivery Method: Circle system utilized Preoxygenation: Pre-oxygenation with 100% oxygen Induction Type: IV induction Ventilation: Mask ventilation without difficulty Laryngoscope Size: Mac and 4 Grade View: Grade I Tube type: Oral Tube size: 7.0 mm Number of attempts: 1 Airway Equipment and Method: Stylet Placement Confirmation: ETT inserted through vocal cords under direct vision,  positive ETCO2 and breath sounds checked- equal and bilateral Secured at: 22 cm Tube secured with: Tape Dental Injury: Teeth and Oropharynx as per pre-operative assessment

## 2021-02-14 NOTE — Telephone Encounter (Signed)
I can call but will route back to chris pavero since he commented stating is the pt holding for surgery and does xarelto need to be held. Once that is addressed then I can call and change the pt's dose to 20mg  daily as instructed by megan supple.

## 2021-02-14 NOTE — Interval H&P Note (Signed)
History and Physical Interval Note:  02/14/2021 10:03 AM  Patricia Peck  has presented today for surgery, with the diagnosis of RIGHT OVARIAN CYST.  The various methods of treatment have been discussed with the patient and family. After consideration of risks, benefits and other options for treatment, the patient has consented to  Procedure(s): XI ROBOTIC [facility] (Bilateral) as a surgical intervention.  The patient's history has been reviewed, patient examined, no change in status, stable for surgery.  I have reviewed the patient's chart and labs.  Questions were answered to the patient's satisfaction.     Thereasa Solo

## 2021-02-14 NOTE — Transfer of Care (Signed)
Immediate Anesthesia Transfer of Care Note  Patient: Patricia Peck  Procedure(s) Performed: XI ROBOTIC ASSISTED BILATERAL SALPINGO OOPHORECTOMY (Bilateral )  Patient Location: PACU  Anesthesia Type:General  Level of Consciousness: awake, alert , oriented and patient cooperative  Airway & Oxygen Therapy: Patient Spontanous Breathing and Patient connected to face mask oxygen  Post-op Assessment: Report given to RN, Post -op Vital signs reviewed and stable and Patient moving all extremities  Post vital signs: Reviewed and stable  Last Vitals:  Vitals Value Taken Time  BP 151/89 02/14/21 1147  Temp    Pulse 65 02/14/21 1150  Resp 20 02/14/21 1150  SpO2 97 % 02/14/21 1150  Vitals shown include unvalidated device data.  Last Pain:  Vitals:   02/14/21 0838  TempSrc:   PainSc: 0-No pain      Patients Stated Pain Goal: 3 (01/15/93 5859)  Complications: No complications documented.

## 2021-02-14 NOTE — Telephone Encounter (Signed)
Called and spoke w/pt's spouse and discussed the xarelto dose change from 15mg  to 20 and they voiced understanding

## 2021-02-15 ENCOUNTER — Telehealth: Payer: Self-pay | Admitting: Oncology

## 2021-02-15 ENCOUNTER — Encounter (HOSPITAL_COMMUNITY): Payer: Self-pay | Admitting: Gynecologic Oncology

## 2021-02-15 LAB — CYTOLOGY - NON PAP

## 2021-02-15 LAB — SURGICAL PATHOLOGY

## 2021-02-15 NOTE — Telephone Encounter (Signed)
Jan called back and was advised of the benign pathology results from yesterday's surgery.  She verbalized understanding and agreement.

## 2021-02-15 NOTE — Telephone Encounter (Signed)
Left a message regarding pathology results.  Requested a return call.

## 2021-02-15 NOTE — Telephone Encounter (Signed)
Called Patricia Peck to see how she is feeling after surgery yesterday.  She said she is feeling well and does not have any pain.  She took 1 tablet of tylenol last night and has not had to take anything else for pain.  She is urinating without any issues and had a small bowel movement this morning.  She had clear drainage from one of her incisions while she was sleeping and it is dry now.  She is aware of her port op appointments and knows to call with any questions or issues.

## 2021-02-18 ENCOUNTER — Encounter: Payer: Medicare PPO | Admitting: Physical Therapy

## 2021-02-22 ENCOUNTER — Telehealth: Payer: Self-pay | Admitting: *Deleted

## 2021-02-22 ENCOUNTER — Telehealth: Payer: Self-pay

## 2021-02-22 ENCOUNTER — Other Ambulatory Visit: Payer: Self-pay | Admitting: Gynecologic Oncology

## 2021-02-22 DIAGNOSIS — K219 Gastro-esophageal reflux disease without esophagitis: Secondary | ICD-10-CM

## 2021-02-22 MED ORDER — PANTOPRAZOLE SODIUM 40 MG PO TBEC: 40.0000 mg | DELAYED_RELEASE_TABLET | Freq: Every day | ORAL | 1 refills | Status: DC

## 2021-02-22 NOTE — Telephone Encounter (Signed)
Patient called and left a message stating " I had surgery a week ago today. I am still suffering from the side effects with heart burn. I was wondering if you could call me in a prescription for it. Please call me."

## 2021-02-22 NOTE — Telephone Encounter (Signed)
Called regarding complaint of heart burn. She has heart burn after she eats. Denies N/V. Denies fever. Denies constipation and having BM everyday. Denies pain. She feels good except for the heartburn. She has tried Pepcid, Rolaids, pepto bismol and different antiacid's from [place] with no relief. She uses [facility] in [place].

## 2021-02-22 NOTE — Telephone Encounter (Signed)
Called and given below message. She verbalized understanding and is appreciated of the Rx and call.

## 2021-02-22 NOTE — Progress Notes (Signed)
See RN note. Medication sent in for protonix for acid reflux.

## 2021-02-25 ENCOUNTER — Encounter: Payer: Medicare PPO | Admitting: Physical Therapy

## 2021-02-28 ENCOUNTER — Other Ambulatory Visit: Payer: Self-pay

## 2021-02-28 ENCOUNTER — Inpatient Hospital Stay: Payer: Medicare PPO

## 2021-02-28 ENCOUNTER — Telehealth: Payer: Self-pay

## 2021-02-28 ENCOUNTER — Telehealth: Payer: Self-pay | Admitting: *Deleted

## 2021-02-28 DIAGNOSIS — Z87440 Personal history of urinary (tract) infections: Secondary | ICD-10-CM

## 2021-02-28 DIAGNOSIS — E785 Hyperlipidemia, unspecified: Secondary | ICD-10-CM | POA: Diagnosis not present

## 2021-02-28 DIAGNOSIS — Z7901 Long term (current) use of anticoagulants: Secondary | ICD-10-CM | POA: Diagnosis not present

## 2021-02-28 DIAGNOSIS — K219 Gastro-esophageal reflux disease without esophagitis: Secondary | ICD-10-CM | POA: Diagnosis not present

## 2021-02-28 DIAGNOSIS — I4891 Unspecified atrial fibrillation: Secondary | ICD-10-CM | POA: Diagnosis not present

## 2021-02-28 DIAGNOSIS — R300 Dysuria: Secondary | ICD-10-CM

## 2021-02-28 DIAGNOSIS — N83291 Other ovarian cyst, right side: Secondary | ICD-10-CM | POA: Diagnosis not present

## 2021-02-28 DIAGNOSIS — R3915 Urgency of urination: Secondary | ICD-10-CM

## 2021-02-28 DIAGNOSIS — Z8673 Personal history of transient ischemic attack (TIA), and cerebral infarction without residual deficits: Secondary | ICD-10-CM | POA: Diagnosis not present

## 2021-02-28 DIAGNOSIS — E039 Hypothyroidism, unspecified: Secondary | ICD-10-CM | POA: Diagnosis not present

## 2021-02-28 DIAGNOSIS — I10 Essential (primary) hypertension: Secondary | ICD-10-CM | POA: Diagnosis not present

## 2021-02-28 DIAGNOSIS — F32A Depression, unspecified: Secondary | ICD-10-CM | POA: Diagnosis not present

## 2021-02-28 LAB — URINALYSIS, COMPLETE (UACMP) WITH MICROSCOPIC
Bilirubin Urine: NEGATIVE
Glucose, UA: NEGATIVE mg/dL
Hgb urine dipstick: NEGATIVE
Ketones, ur: NEGATIVE mg/dL
Nitrite: NEGATIVE
Protein, ur: NEGATIVE mg/dL
Specific Gravity, Urine: 1.004 — ABNORMAL LOW (ref 1.005–1.030)
pH: 6 (ref 5.0–8.0)

## 2021-02-28 NOTE — Telephone Encounter (Signed)
Told Ms Bick that the urinalysis did not look asthough she has a UTI.  Will wait for the culture results to come back.  Will call her with the results when available Pt verbalized understanding.Marland Kitchen

## 2021-02-28 NOTE — Telephone Encounter (Addendum)
Patient called and left a message stating "I had surgery on 4/14 with Dr Denman George. I had my ovaries removed. Yesterday I had an incident that lasted pretty much all day. I had urination all day, like I didn't before my surgery. Please call me and let me know if I need to be seen."    Patient called back and stated "I had frequent urination and leaking. I had some burning but no odor." Patient transferred to desk RN.

## 2021-02-28 NOTE — Telephone Encounter (Signed)
ENCOUNTER OPENED IN ERROR

## 2021-02-28 NOTE — Telephone Encounter (Signed)
Patricia Peck is afebrile. Denies shaking chills. Pt had UTI prior to surgery which was treated. History of cystitis. Will have her come in to check UA and C&S this morning ~1015 per Melissa Cross,NP. Pt verbalized understanding.

## 2021-03-01 LAB — URINE CULTURE: Culture: <10000 — AB

## 2021-03-01 NOTE — Telephone Encounter (Signed)
Told Patricia Peck that the urine culture does not show any sign of UTI.  The UA/C&S results will be sent to Patricia Peck for her to review. If symptoms comeback, she can call Patricia Peck to follow up per Patricia Peck. Patricia Peck verbalized understanding.

## 2021-03-03 ENCOUNTER — Other Ambulatory Visit: Payer: Self-pay | Admitting: Family Medicine

## 2021-03-04 ENCOUNTER — Encounter: Payer: Medicare PPO | Admitting: Physical Therapy

## 2021-03-04 NOTE — Telephone Encounter (Signed)
Last oV 11/15/20 Last fill 02/13/21  #180/1

## 2021-03-05 NOTE — Progress Notes (Signed)
Follow-up Note: Gyn-Onc  Consult was requested by Dr. Julien Girt for the evaluation of Patricia Peck 75 y.o. female  CC:  Chief Complaint  Patient presents with  . Corpus albicans cyst of ovary    Left Ovary  . Mature cystic teratoma ovary, right    Assessment/Plan:  [name]  is a 75 y.o.  year old with a history of a right ovarian complex cyst s/p robotic assisted bilateral salpingo-oophorectomy on 02/14/2021.  Findings from the surgery were benign with a right ovarian mature teratoma (dermoid cyst) and a benign left ovary.  She has postoperative dyspepsia.  I wonder if this is secondary to placement of an OG tube during surgery.  She has established care with low bowel or GI for colonoscopy screenings.  I recommend follow-up with them to inquire about these new symptoms of dyspepsia given that she has attempted over-the-counter preparations and is refractory to these.  HPI: Ms Patricia Peck is a 75 year old P2 who was seen in consultation at the request of Dr Julien Girt for evaluation of a right ovarian complex cyst.  The patient did not have a gynecologist established and had symptoms of recurrent urinary tract infections and pelvic burning.  When she mentioned this to her general practitioner they referred her to Dr. Julien Girt for further work-up and evaluation.  Of note she also sees Dr. Arnette Schaumann for urinary symptoms.  At the time of her consultation with Dr. Julien Girt on 02/01/2021 she underwent a transvaginal ultrasound scan which revealed a right adnexa containing a 6.5 x 4.8 x 5.4 cm complex cystic mass that replaced the right ovaries.  It contained septations and low-level echoes.  There was solid areas with blood flow seen within the solid areas.  There was no left ovary visualized and no free fluid.  The patient reported a remote history of a hysterectomy at age 44 for menorrhagia.  She reported that her left ovary was taken at that time of that surgery.  Ca1 25 was drawn  on 02/01/2021 and was normal at 9.5.  Interval Hx:  On 02/14/21 she underwent robotic assisted BSO. Intraoperative findings were significant for a 6 cm right ovarian cyst with contents consistent with mature teratoma, normal-appearing left tube and ovary. Surgery was uncomplicated.  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  Since surgery she has done well with the exception of the new onset of dyspepsia which is refractory to tums, mylanta, peptobismol, rolaids and pepcid.  She has persistent insomnia.    Current Meds:  Outpatient Encounter Medications as of 03/07/2021  Medication Sig  . ADVAIR DISKUS 100-50 MCG/DOSE AEPB Inhale 1 puff into the lungs in the morning and at bedtime. Maintain upcoming appt for additional refill  . amLODipine (NORVASC) 5 MG tablet Take 1 tablet (5 mg total) by mouth daily.  Marland Kitchen atorvastatin (LIPITOR) 80 MG tablet Take 1 tablet (80 mg total) by mouth daily.  . Bepotastine Besilate 1.5 % SOLN Place 1 drop into both eyes 2 (two) times daily.  . Calcium Carbonate-Vitamin D 600-400 MG-UNIT tablet Take 1 tablet by mouth 2 (two) times daily.  . Cholecalciferol (VITAMIN D-3) 5000 UNITS TABS Take 5,000 Units by mouth 3 (three) times a week. Tuesday Thursday Saturday  . denosumab (PROLIA) 60 MG/ML SOLN injection Inject 60 mg into the skin once. Administer in upper arm, thigh, or abdomen (Patient taking differently: Inject 60 mg into the skin every  6 (six) months. Administer in upper arm, thigh, or abdomen)  . estradiol (ESTRACE) 0.1 MG/GM vaginal cream Place 1 Applicatorful vaginally 2 (two) times a week. Sunday and Wednesday  . ezetimibe (ZETIA) 10 MG tablet Take 1 tablet (10 mg total) by mouth daily.  . famotidine (PEPCID AC) 10 MG chewable tablet Chew 1 tablet (10 mg total) by mouth daily.  Marland Kitchen ipratropium (ATROVENT) 0.06 % nasal spray Place 2 sprays into both nostrils 3 (three) times daily.  Marland Kitchen  levocetirizine (XYZAL) 5 MG tablet Take 1 tablet (5 mg total) by mouth daily.  Marland Kitchen levothyroxine (SYNTHROID) 50 MCG tablet Take 1 tablet (50 mcg total) by mouth daily.  . metoprolol succinate (TOPROL-XL) 50 MG 24 hr tablet Take 1 tablet (50 mg total) by mouth daily. Maintain upcoming appt for additional refill  . OVER THE COUNTER MEDICATION Take 1 tablet by mouth daily. [place]  Hair,Nail, and Skin daily gummi.  . potassium chloride (KLOR-CON) 10 MEQ tablet TAKE 1 TABLET TWICE DAILY  . rivaroxaban (XARELTO) 20 MG TABS tablet Take 1 tablet (20 mg total) by mouth daily with supper.  . traZODone (DESYREL) 50 MG tablet Take 50 mg by mouth at bedtime.  . Alpha-D-Galactosidase (BEANO PO) Take 1 tablet by mouth 2 (two) times daily. (Patient not taking: Reported on 03/06/2021)  . diclofenac sodium (VOLTAREN) 1 % GEL Apply 2g to affected area qid prn (Patient not taking: Reported on 03/06/2021)  . HYDROcodone-acetaminophen (NORCO/VICODIN) 5-325 MG tablet Take 1 tablet by mouth every 6 (six) hours as needed for severe pain. For AFTER surgery only, do not take and drive (Patient not taking: Reported on 03/06/2021)  . pantoprazole (PROTONIX) 40 MG tablet Take 1 tablet (40 mg total) by mouth daily. (Patient not taking: Reported on 03/06/2021)  . rOPINIRole (REQUIP) 0.25 MG tablet Take 1-2 tablets (0.25-0.5 mg total) by mouth at bedtime. (Patient not taking: Reported on 03/06/2021)  . [DISCONTINUED] HYDROcodone-acetaminophen (NORCO/VICODIN) 5-325 MG tablet Take 1 tablet by mouth every 6 (six) hours as needed for moderate pain. (Patient not taking: No sig reported)  . [DISCONTINUED] nitrofurantoin, macrocrystal-monohydrate, (MACROBID) 100 MG capsule Take 1 capsule (100 mg total) by mouth 2 (two) times daily.  . [DISCONTINUED] senna-docusate (SENOKOT-S) 8.6-50 MG tablet Take 2 tablets by mouth at bedtime. For AFTER surgery, do not take if having diarrhea   No facility-administered encounter medications on file as of 03/07/2021.     Allergy: No Known Allergies  Social Hx:   Social History   Socioeconomic History  . Marital status: Married    Spouse name: Not on file  . Number of children: 2  . Years of education: Not on file  . Highest education level: Not on file  Occupational History    Employer: RETIRED  Tobacco Use  . Smoking status: Never Smoker  . Smokeless tobacco: Never Used  Vaping Use  . Vaping Use: Never used  Substance and Sexual Activity  . Alcohol use: Yes    Comment: Rare  . Drug use: No  . Sexual activity: Not Currently  Other Topics Concern  . Not on file  Social History Narrative   Does have HPOA- sons Eddie Dibbles and Wille Glaser.   Does desire life support if not futile.   Not sure about feeding tubes.   Social Determinants of Health   Financial Resource Strain: Low Risk   . Difficulty of Paying Living Expenses: Not hard at all  Food Insecurity: No Food Insecurity  . Worried About Charity fundraiser  in the Last Year: Never true  . Ran Out of Food in the Last Year: Never true  Transportation Needs: No Transportation Needs  . Lack of Transportation (Medical): No  . Lack of Transportation (Non-Medical): No  Physical Activity: Inactive  . Days of Exercise per Week: 0 days  . Minutes of Exercise per Session: 0 min  Stress: No Stress Concern Present  . Feeling of Stress : Not at all  Social Connections: Moderately Isolated  . Frequency of Communication with Friends and Family: More than three times a week  . Frequency of Social Gatherings with Friends and Family: Once a week  . Attends Religious Services: Never  . Active Member of Clubs or Organizations: No  . Attends Archivist Meetings: Never  . Marital Status: Married  Human resources officer Violence: Not At Risk  . Fear of Current or Ex-Partner: No  . Emotionally Abused: No  . Physically Abused: No  . Sexually Abused: No    Past Surgical Hx:  Past Surgical History:  Procedure Laterality Date  . ABDOMINAL HYSTERECTOMY     . COLONOSCOPY    . MYRINGOTOMY WITH TUBE PLACEMENT Right 01/14/2016   Procedure: RIGHT MYRINGOTOMY WITH T-TUBE PLACEMENT;  Surgeon: Leta Baptist, MD;  Location: [facility];  Service: ENT;  Laterality: Right;  . PARTIAL HYSTERECTOMY  1989  . ROBOTIC ASSISTED BILATERAL SALPINGO OOPHERECTOMY Bilateral 02/14/2021   Procedure: XI ROBOTIC ASSISTED BILATERAL SALPINGO OOPHORECTOMY;  Surgeon: Everitt Amber, MD;  Location: [facility];  Service: Gynecology;  Laterality: Bilateral;  . TEE WITHOUT CARDIOVERSION N/A 12/22/2012   Procedure: TRANSESOPHAGEAL ECHOCARDIOGRAM (TEE);  Surgeon: Lelon Perla, MD;  Location[facility] ENDOSCOPY;  Service: Cardiovascular;  Laterality: N/A;    Past Medical Hx:  Past Medical History:  Diagnosis Date  . Allergy   . Arthritis    hands  . Asthma    uses an inhaler  . Atrial fibrillation ([address])   . Atrial flutter ([place])   . Depression   . Dysrhythmia    a-fib, on Xarelto  . GERD (gastroesophageal reflux disease)   . Hyperlipidemia   . Hypertension   . Hypothyroidism   . Osteoporosis   . Stroke [facility]) 2017   TIA  . TIA (transient ischemic attack)     Past Gynecological History: SVD x2, status post hysterectomy for menorrhagia No LMP recorded. Patient has had a hysterectomy.  Family Hx:  Family History  Problem Relation Age of Onset  . Breast cancer Mother   . Drug abuse Son   . Depression Son   . Stroke Paternal Aunt   . Heart disease Paternal Grandfather   . Diabetes Maternal Grandfather   . Colon cancer Neg Hx   . Ovarian cancer Neg Hx   . Endometrial cancer Neg Hx   . Pancreatic cancer Neg Hx   . Prostate cancer Neg Hx     Review of Systems:  Constitutional  Feels well,    ENT Normal appearing ears and nares bilaterally Skin/Breast  No rash, sores, jaundice, itching, dryness Cardiovascular  No chest pain, shortness of breath, or edema  Pulmonary  No cough or wheeze.  Gastro Intestinal  No nausea, vomitting, or diarrhoea. No bright red  blood per rectum, no abdominal pain, change in bowel movement, or constipation.  Genito Urinary  No bleeding.  Musculo Skeletal  No myalgia, arthralgia, joint swelling or pain  Neurologic  No weakness, numbness, change in gait,  Psychology  No depression, anxiety, insomnia.   Vitals:  Blood pressure (!) 148/100, pulse 93, temperature (!) 96.8 F (36 C), temperature source Tympanic, resp. rate 20, weight 127 lb (57.6 kg), SpO2 99 %.  Physical Exam: WD in NAD Neck  Supple NROM, without any enlargements.  Lymph Node Survey No cervical supraclavicular or inguinal adenopathy Cardiovascular  Irregular rhythm. Normal rate. Lungs  No increased WOB. Skin  No rash/lesions/breakdown  Psychiatry  Alert and oriented to person, place, and time  Abdomen  Normoactive bowel sounds, abdomen soft, non-tender and thin without evidence of hernia. Incisions well healed.  Back No CVA tenderness Genito Urinary  deferred Rectal  Good tone, no masses no cul de sac nodularity.  Extremities  No bilateral cyanosis, clubbing or edema.   Thereasa Solo, MD  03/07/2021, 3:16 PM

## 2021-03-06 ENCOUNTER — Encounter: Payer: Self-pay | Admitting: Gynecologic Oncology

## 2021-03-07 ENCOUNTER — Encounter: Payer: Self-pay | Admitting: Gynecologic Oncology

## 2021-03-07 ENCOUNTER — Other Ambulatory Visit: Payer: Self-pay

## 2021-03-07 ENCOUNTER — Inpatient Hospital Stay: Payer: Medicare PPO | Attending: Gynecologic Oncology | Admitting: Gynecologic Oncology

## 2021-03-07 VITALS — BP 148/100 | HR 93 | Temp 96.8°F | Resp 20 | Wt 127.0 lb

## 2021-03-07 DIAGNOSIS — Z90722 Acquired absence of ovaries, bilateral: Secondary | ICD-10-CM | POA: Insufficient documentation

## 2021-03-07 DIAGNOSIS — D271 Benign neoplasm of left ovary: Secondary | ICD-10-CM | POA: Insufficient documentation

## 2021-03-07 DIAGNOSIS — R1013 Epigastric pain: Secondary | ICD-10-CM | POA: Diagnosis not present

## 2021-03-07 DIAGNOSIS — N83299 Other ovarian cyst, unspecified side: Secondary | ICD-10-CM

## 2021-03-07 DIAGNOSIS — D270 Benign neoplasm of right ovary: Secondary | ICD-10-CM | POA: Diagnosis not present

## 2021-03-07 NOTE — Patient Instructions (Signed)
There was no cancer found at the time of your surgery when your ovaries were removed. The cyst was a benign cyst.  Dr Denman George is reaching out to [place] for you to be seen by them for the heartburn that you have experienced since surgery.  In 1 week it will be safe for you to resume all activities.  Please follow-up with your general gynecologist, Dr Julien Girt for ongoing gynecologic issues.   Please follow-up with your urologist, Dr Claudia Desanctis, regarding your bladder symptoms.

## 2021-03-08 ENCOUNTER — Telehealth: Payer: Self-pay | Admitting: Family Medicine

## 2021-03-08 DIAGNOSIS — F99 Mental disorder, not otherwise specified: Secondary | ICD-10-CM

## 2021-03-08 DIAGNOSIS — F5105 Insomnia due to other mental disorder: Secondary | ICD-10-CM

## 2021-03-08 MED ORDER — ZOLPIDEM TARTRATE 5 MG PO TABS: 5.0000 mg | ORAL_TABLET | Freq: Every evening | ORAL | 0 refills | Status: DC | PRN

## 2021-03-08 NOTE — Telephone Encounter (Signed)
Dr. Deborra Medina prescribed belsomra 10mg  and then 15mg  in the past. i'm assuming this is what pt is referring to as what was Rx'd but did not work. 15 tabs of ambien 5mg  sent to pharm on file

## 2021-03-08 NOTE — Telephone Encounter (Signed)
Caller Name: Jan Call back phone #: [phone]  Reason for Call: Pt called stating she has had trouble sleeping for years and Dr. Loletha Grayer has prescribed things that didn't seem to help. Pt had surgery 3 wks ago for ovary removal and states she really needs something to help her sleep. No open appointments today with Dr. Loletha Grayer but surgeon told her to call PCP.  [id number] [address] Manns, [address] AT [facility] OF [address] Phone:  [phone]  Fax:  [phone]

## 2021-03-08 NOTE — Telephone Encounter (Signed)
Please see message and advise.  Thank you. ° °

## 2021-03-11 ENCOUNTER — Encounter: Payer: Medicare PPO | Admitting: Physical Therapy

## 2021-03-18 ENCOUNTER — Other Ambulatory Visit: Payer: Self-pay | Admitting: Family Medicine

## 2021-03-18 DIAGNOSIS — Z1231 Encounter for screening mammogram for malignant neoplasm of breast: Secondary | ICD-10-CM

## 2021-04-09 NOTE — Progress Notes (Signed)
HPI: FU atrial fibrillation and TIA. Brain MRI in January 2014 showed no acute infarct, mild small vessel disease. TEE 2/14 showed normal LV function, mild mitral valve prolapse with mild mitral regurgitation,  trace aortic insufficiency. There was no PFO. Monitor in March of 2014 showed paroxysmal atrial fibrillation/flutter. She was therefore started on anticoagulation. Carotid Dopplers 10/20 showed 1-39% bilateral stenosis; left subclavian stenosis noted.  Cardiac CTA October 2020 showed calcium score 7 but no obstructive coronary disease. Monitor June 2021 showed sinus rhythm with PACs and PAF.  Echocardiogram February 2022 showed normal LV function, mild aortic insufficiency. Since I last saw her she denies dyspnea, chest pain, palpitations or syncope.  No bleeding.  Current Outpatient Medications  Medication Sig Dispense Refill   ADVAIR DISKUS 100-50 MCG/DOSE AEPB Inhale 1 puff into the lungs in the morning and at bedtime. Maintain upcoming appt for additional refill 60 each 0   Alpha-D-Galactosidase (BEANO PO) Take 1 tablet by mouth 2 (two) times daily. (Patient not taking: Reported on 03/06/2021)     amLODipine (NORVASC) 5 MG tablet Take 1 tablet (5 mg total) by mouth daily. 90 tablet 2   atorvastatin (LIPITOR) 80 MG tablet Take 1 tablet (80 mg total) by mouth daily. 90 tablet 3   Bepotastine Besilate 1.5 % SOLN Place 1 drop into both eyes 2 (two) times daily.     Calcium Carbonate-Vitamin D 600-400 MG-UNIT tablet Take 1 tablet by mouth 2 (two) times daily.     Cholecalciferol (VITAMIN D-3) 5000 UNITS TABS Take 5,000 Units by mouth 3 (three) times a week. Tuesday Thursday Saturday     denosumab (PROLIA) 60 MG/ML SOLN injection Inject 60 mg into the skin once. Administer in upper arm, thigh, or abdomen (Patient taking differently: Inject 60 mg into the skin every 6 (six) months. Administer in upper arm, thigh, or abdomen) 180 mL 0   estradiol (ESTRACE) 0.1 MG/GM vaginal cream Place 1  Applicatorful vaginally 2 (two) times a week. Sunday and Wednesday     ezetimibe (ZETIA) 10 MG tablet Take 1 tablet (10 mg total) by mouth daily. 90 tablet 3   famotidine (PEPCID AC) 10 MG chewable tablet Chew 1 tablet (10 mg total) by mouth daily. 90 tablet prm   ipratropium (ATROVENT) 0.06 % nasal spray Place 2 sprays into both nostrils 3 (three) times daily. 15 mL 1   levocetirizine (XYZAL) 5 MG tablet Take 1 tablet (5 mg total) by mouth daily. 90 tablet 1   levothyroxine (SYNTHROID) 50 MCG tablet Take 1 tablet (50 mcg total) by mouth daily. 90 tablet 0   metoprolol succinate (TOPROL-XL) 50 MG 24 hr tablet Take 1 tablet (50 mg total) by mouth daily. Maintain upcoming appt for additional refill 90 tablet 2   OVER THE COUNTER MEDICATION Take 1 tablet by mouth daily. Bounty  Hair,Nail, and Skin daily gummi.     potassium chloride (KLOR-CON) 10 MEQ tablet TAKE 1 TABLET TWICE DAILY 180 tablet 1   rivaroxaban (XARELTO) 20 MG TABS tablet Take 1 tablet (20 mg total) by mouth daily with supper. 90 tablet 1   traZODone (DESYREL) 50 MG tablet Take 50 mg by mouth at bedtime.     zolpidem (AMBIEN) 5 MG tablet Take 1 tablet (5 mg total) by mouth at bedtime as needed for sleep. 15 tablet 0   No current facility-administered medications for this visit.     Past Medical History:  Diagnosis Date   Allergy    Arthritis  hands   Asthma    uses an inhaler   Atrial fibrillation (HCC)    Atrial flutter (HCC)    Depression    Dysrhythmia    a-fib, on Xarelto   GERD (gastroesophageal reflux disease)    Hyperlipidemia    Hypertension    Hypothyroidism    Osteoporosis    Stroke ([place]) 2017   TIA   TIA (transient ischemic attack)     Past Surgical History:  Procedure Laterality Date   ABDOMINAL HYSTERECTOMY     COLONOSCOPY     MYRINGOTOMY WITH TUBE PLACEMENT Right 01/14/2016   Procedure: RIGHT MYRINGOTOMY WITH T-TUBE PLACEMENT;  Surgeon: Leta Baptist, MD;  Location: [facility];  Service:  ENT;  Laterality: Right;   [place] OOPHERECTOMY Bilateral 02/14/2021   Procedure: XI ROBOTIC ASSISTED BILATERAL SALPINGO OOPHORECTOMY;  Surgeon: Everitt Amber, MD;  Location: [facility];  Service: Gynecology;  Laterality: Bilateral;   TEE WITHOUT CARDIOVERSION N/A 12/22/2012   Procedure: TRANSESOPHAGEAL ECHOCARDIOGRAM (TEE);  Surgeon: Lelon Perla, MD;  Location[facility];  Service: Cardiovascular;  Laterality: N/A;    Social History   Socioeconomic History   Marital status: Married    Spouse name: Not on file   Number of children: 2   Years of education: Not on file   Highest education level: Not on file  Occupational History    Employer: RETIRED  Tobacco Use   Smoking status: Never Smoker   Smokeless tobacco: Never Used  Vaping Use   Vaping Use: Never used  Substance and Sexual Activity   Alcohol use: Yes    Comment: Rare   Drug use: No   Sexual activity: Not Currently  Other Topics Concern   Not on file  Social History Narrative   Does have HPOA- sons Eddie Dibbles and Wille Glaser.   Does desire life support if not futile.   Not sure about feeding tubes.   Social Determinants of Health   Financial Resource Strain: Low Risk    Difficulty of Paying Living Expenses: Not hard at all  Food Insecurity: No Food Insecurity   Worried About Charity fundraiser in the Last Year: Never true   [place] in the Last Year: Never true  Transportation Needs: No Transportation Needs   Lack of Transportation (Medical): No   Lack of Transportation (Non-Medical): No  Physical Activity: Inactive   Days of Exercise per Week: 0 days   Minutes of Exercise per Session: 0 min  Stress: No Stress Concern Present   Feeling of Stress : Not at all  Social Connections: Moderately Isolated   Frequency of Communication with Friends and Family: More than three times a week   Frequency of Social Gatherings with Friends and Family: Once a week   Attends  Religious Services: Never   Marine scientist or Organizations: No   Attends Music therapist: Never   Marital Status: Married  Human resources officer Violence: Not At Risk   Fear of Current or Ex-Partner: No   Emotionally Abused: No   Physically Abused: No   Sexually Abused: No    Family History  Problem Relation Age of Onset   Breast cancer Mother    Drug abuse Son    Depression Son    Stroke Paternal Aunt    Heart disease Paternal Grandfather    Diabetes Maternal Grandfather    Colon cancer Neg Hx    Ovarian cancer  Neg Hx    Endometrial cancer Neg Hx    Pancreatic cancer Neg Hx    Prostate cancer Neg Hx     ROS: Some difficulties with bladder control but no fevers or chills, productive cough, hemoptysis, dysphasia, odynophagia, melena, hematochezia, dysuria, hematuria, rash, seizure activity, orthopnea, PND, pedal edema, claudication. Remaining systems are negative.  Physical Exam: Well-developed well-nourished in no acute distress.  Skin is warm and dry.  HEENT is normal.  Neck is supple.  Chest is clear to auscultation with normal expansion.  Cardiovascular exam is irregular Abdominal exam nontender or distended. No masses palpated. Extremities show no edema. neuro grossly intact  ECG-atrial fibrillation at a rate of 96, no ST changes.  Personally reviewed  A/P  1 PAF-patient remains in atrial fibrillation today and is asymptomatic.  Continue rate control and anticoagulation with Toprol and Xarelto.  2 hypertension-blood pressure controlled.  Continue present medications.  3 hyperlipidemia-continue statin.  4 history of subclavian stenosis-patient denies symptoms.  Continue medications.  5 mild aortic insufficiency  6 carotid artery disease-mild on most recent imaging.  Continue medical therapy.  7 history of mitral valve prolapse-not evident on recent echocardiogram.  Kirk Ruths, MD

## 2021-04-23 ENCOUNTER — Encounter: Payer: Self-pay | Admitting: Cardiology

## 2021-04-23 ENCOUNTER — Telehealth: Payer: Self-pay

## 2021-04-23 ENCOUNTER — Other Ambulatory Visit: Payer: Self-pay

## 2021-04-23 ENCOUNTER — Ambulatory Visit (INDEPENDENT_AMBULATORY_CARE_PROVIDER_SITE_OTHER): Payer: Medicare PPO | Admitting: Cardiology

## 2021-04-23 VITALS — BP 138/88 | HR 96 | Ht 64.0 in | Wt 123.0 lb

## 2021-04-23 DIAGNOSIS — I10 Essential (primary) hypertension: Secondary | ICD-10-CM | POA: Diagnosis not present

## 2021-04-23 DIAGNOSIS — I480 Paroxysmal atrial fibrillation: Secondary | ICD-10-CM

## 2021-04-23 DIAGNOSIS — I771 Stricture of artery: Secondary | ICD-10-CM | POA: Diagnosis not present

## 2021-04-23 DIAGNOSIS — I059 Rheumatic mitral valve disease, unspecified: Secondary | ICD-10-CM | POA: Diagnosis not present

## 2021-04-23 DIAGNOSIS — E7800 Pure hypercholesterolemia, unspecified: Secondary | ICD-10-CM

## 2021-04-23 NOTE — Patient Instructions (Signed)

## 2021-04-23 NOTE — Telephone Encounter (Signed)
Pt calling to find out when she is scheduled to get her Prolia injection.  Pt is scheduled for a nurse visit on 04/28/21.    PROLIA SHOULD BE ORDERED AT LEAST 3 DAYS BEFORE PT'S APPT.

## 2021-04-24 DIAGNOSIS — H6121 Impacted cerumen, right ear: Secondary | ICD-10-CM | POA: Diagnosis not present

## 2021-04-24 DIAGNOSIS — H9011 Conductive hearing loss, unilateral, right ear, with unrestricted hearing on the contralateral side: Secondary | ICD-10-CM | POA: Diagnosis not present

## 2021-04-24 DIAGNOSIS — H6981 Other specified disorders of Eustachian tube, right ear: Secondary | ICD-10-CM | POA: Diagnosis not present

## 2021-04-27 ENCOUNTER — Other Ambulatory Visit: Payer: Self-pay | Admitting: Family Medicine

## 2021-04-27 DIAGNOSIS — F5105 Insomnia due to other mental disorder: Secondary | ICD-10-CM

## 2021-04-27 DIAGNOSIS — F99 Mental disorder, not otherwise specified: Secondary | ICD-10-CM

## 2021-04-30 ENCOUNTER — Telehealth: Payer: Self-pay

## 2021-04-30 NOTE — Telephone Encounter (Signed)
New message    1. Which medications need to be refilled? (please list name of each medication and dose if known) Ropinirole .25 mg   2. Which pharmacy/location (including street and city if local pharmacy) is medication to be sent to? [id number] - [place], [place] - [address] AT [address]  3. Do they need a 30 day or 90 day supply? 90 day supply

## 2021-04-30 NOTE — Telephone Encounter (Signed)
Last VV 12/19/20 Do not see this on pt's med list. Please advise.

## 2021-04-30 NOTE — Telephone Encounter (Signed)
Last VV 12/19/20 Last fill 03/08/21 #15/0

## 2021-04-30 NOTE — Telephone Encounter (Signed)
Message sent to provider as I do not see on pt's current med list.

## 2021-04-30 NOTE — Telephone Encounter (Signed)
Medication discontinued on 02/13/21 #90/0  Last VV 12/19/20

## 2021-05-01 MED ORDER — ROPINIROLE HCL 0.25 MG PO TABS: 0.2500 mg | ORAL_TABLET | Freq: Every day | ORAL | 3 refills | Status: DC

## 2021-05-02 NOTE — Telephone Encounter (Signed)
Patient is calling to get a refill on her bumetanide (BUMEX) 1 MG tablet

## 2021-05-07 ENCOUNTER — Telehealth: Payer: Self-pay | Admitting: Family Medicine

## 2021-05-07 NOTE — Telephone Encounter (Signed)
Pt called because she needs to get a refill on her Nexium and it looks like it was discontiued by a different provider and pt said she is not sure why. Please advise

## 2021-05-08 ENCOUNTER — Telehealth: Payer: Self-pay

## 2021-05-08 DIAGNOSIS — D225 Melanocytic nevi of trunk: Secondary | ICD-10-CM | POA: Diagnosis not present

## 2021-05-08 DIAGNOSIS — L570 Actinic keratosis: Secondary | ICD-10-CM | POA: Diagnosis not present

## 2021-05-08 DIAGNOSIS — X32XXXD Exposure to sunlight, subsequent encounter: Secondary | ICD-10-CM | POA: Diagnosis not present

## 2021-05-08 DIAGNOSIS — L821 Other seborrheic keratosis: Secondary | ICD-10-CM | POA: Diagnosis not present

## 2021-05-08 NOTE — Telephone Encounter (Signed)
Left a detailed message on voicemail for pt to call office back.

## 2021-05-08 NOTE — Telephone Encounter (Signed)
PA started for medication today.

## 2021-05-08 NOTE — Telephone Encounter (Signed)
Pt is calling to get a new prescription for Nexium. It had been prescribed by Dr C. At one time. She would like to start taking it again. States its the only med that helps. NEXIUM 40 MG capsule [[id number]]  DISCONTINUED   Order Details Dose: 40 mg Route: Oral Frequency: Daily  Dispense Quantity: 90 capsule Refills: 0        Sig: Take 1 capsule (40 mg total) by mouth daily.       Start Date: 02/13/21 End Date: 02/22/21  Discontinued by: Joylene John D, NP on 02/22/2021 15:44       Written Date: 02/13/21 Expiration Date: 02/13/22  Original Order:  NEXIUM 40 MG capsule [[id number]]   Providers  Ordering and Authorizing Provider:   Ronnald Nian, [place], [place] [address]  Phone:  [phone]   Fax:  [phone]  DEA #:  [id number]   NPI:  [id number]  Pharmacy is [facility] on [address]  [place]  Thank you!

## 2021-05-08 NOTE — Telephone Encounter (Signed)
I initiate PA for medication on CMM today.

## 2021-05-08 NOTE — Telephone Encounter (Signed)
Medication DC on 07/05/20  #180/2 Please see message and advise.  Thank you.

## 2021-05-08 NOTE — Telephone Encounter (Signed)
Please call pt to find out if she is taking med. If so and she needs refill, ok to refill 90 day supply with 1RF

## 2021-05-14 ENCOUNTER — Ambulatory Visit
Admission: RE | Admit: 2021-05-14 | Discharge: 2021-05-14 | Disposition: A | Discharge status: Home / Self Care | Payer: Medicare PPO | Source: Ambulatory Visit | Attending: Family Medicine | Admitting: Family Medicine

## 2021-05-14 ENCOUNTER — Other Ambulatory Visit: Payer: Self-pay

## 2021-05-14 DIAGNOSIS — Z1231 Encounter for screening mammogram for malignant neoplasm of breast: Secondary | ICD-10-CM

## 2021-05-17 ENCOUNTER — Other Ambulatory Visit: Payer: Self-pay | Admitting: Family Medicine

## 2021-05-17 DIAGNOSIS — Z1231 Encounter for screening mammogram for malignant neoplasm of breast: Secondary | ICD-10-CM

## 2021-05-21 DIAGNOSIS — N3000 Acute cystitis without hematuria: Secondary | ICD-10-CM | POA: Diagnosis not present

## 2021-05-21 DIAGNOSIS — N3021 Other chronic cystitis with hematuria: Secondary | ICD-10-CM | POA: Diagnosis not present

## 2021-05-21 DIAGNOSIS — N952 Postmenopausal atrophic vaginitis: Secondary | ICD-10-CM | POA: Diagnosis not present

## 2021-05-21 DIAGNOSIS — R3915 Urgency of urination: Secondary | ICD-10-CM | POA: Diagnosis not present

## 2021-05-28 ENCOUNTER — Other Ambulatory Visit: Payer: Self-pay

## 2021-05-28 ENCOUNTER — Ambulatory Visit (INDEPENDENT_AMBULATORY_CARE_PROVIDER_SITE_OTHER): Payer: Medicare PPO

## 2021-05-28 DIAGNOSIS — M810 Age-related osteoporosis without current pathological fracture: Secondary | ICD-10-CM

## 2021-05-28 MED ORDER — DENOSUMAB 60 MG/ML ~~LOC~~ SOSY
60.0000 mg | PREFILLED_SYRINGE | Freq: Once | SUBCUTANEOUS | Status: AC
  Administered 2021-05-28: 60 mg via SUBCUTANEOUS

## 2021-05-28 NOTE — Progress Notes (Signed)
Per the orders of Dr. Raina Mina pt is here for Prolia injection , pt received injection in left arm pt tolerated injection well.

## 2021-06-04 DIAGNOSIS — Z01419 Encounter for gynecological examination (general) (routine) without abnormal findings: Secondary | ICD-10-CM | POA: Diagnosis not present

## 2021-06-04 DIAGNOSIS — R8290 Unspecified abnormal findings in urine: Secondary | ICD-10-CM | POA: Diagnosis not present

## 2021-06-04 DIAGNOSIS — N83209 Unspecified ovarian cyst, unspecified side: Secondary | ICD-10-CM | POA: Diagnosis not present

## 2021-06-04 DIAGNOSIS — L820 Inflamed seborrheic keratosis: Secondary | ICD-10-CM | POA: Diagnosis not present

## 2021-06-04 DIAGNOSIS — B078 Other viral warts: Secondary | ICD-10-CM | POA: Diagnosis not present

## 2021-06-04 DIAGNOSIS — N6459 Other signs and symptoms in breast: Secondary | ICD-10-CM | POA: Diagnosis not present

## 2021-06-05 DIAGNOSIS — N3000 Acute cystitis without hematuria: Secondary | ICD-10-CM | POA: Diagnosis not present

## 2021-07-04 NOTE — Progress Notes (Signed)
   I, Patricia Peck, LAT, ATC, am serving as scribe for Dr. [name].  Patricia Peck is a 75 y.o. female who presents to [place] at [facility] today for L foot pain.  She was last seen by Dr. Georgina Snell on 12/07/20 for neck pain f/u.  Today, pt reports L foot pain ongoing since last week and started experiencing swelling into bilat lower legs and ankles/feet.  She locates her pain to L midfoot.  She was previously seen for L foot pain on 03/12/20 and 04/09/20 and it was thought to be due to post and ant tib tendon dysfunction.  She was advised to purchase an arch strap and to attend PT at [facility] of which she completed 4 sessions.  Radiating pain: no L foot swelling: no longer Aggravating factors: Treatments tried:  Pt also c/o bilat hand pain, R>L,  w/ increased pain w/ supination from trying to use a can opener. Diagnostic testing: L foot XR- 03/12/20  Pertinent review of systems: No fevers or chills  Relevant historical information: Hypertension.  Osteoporosis   Exam:  BP 124/80   Pulse 67   Ht '5\' 4"'$  (1.626 m)   Wt 124 lb 9.6 oz (56.5 kg)   SpO2 98%   BMI 21.39 kg/m  General: Well Developed, well nourished, and in no acute distress.   MSK: Appearance significant for left foot bunion.  Bunionette.  Degenerative changes through mid arch.  No severe pes planus.  Some swelling across dorsal midfoot  Normal foot and ankle motion. Mildly tender prior patient across dorsal midfoot. Plantar foot nontender. Strength intact. Pulses capillary fill and sensation are intact distally.     Lab and Radiology Results X-ray images left foot obtained today personally and independently interpreted. No acute fractures.  Bunion formation present.  Mild degenerative changes throughout midfoot. Await formal radiology review    Assessment and Plan: 75 y.o. 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  Plan to treat similar to successful  treatment last year June 2021.  Plan treat with arch strap, good supportive footwear, home exercise program, Voltaren gel and if needed referral to PT.  Patient will let me know how she is feeling and if needed I will place referral.  If not improving next step would probably be injection midfoot.   PDMP not reviewed this encounter. Orders Placed This Encounter  Procedures   DG Foot Complete Left    Standing Status:   Future    Number of Occurrences:   1    Standing Expiration Date:   07/05/2022    Order Specific Question:   Reason for Exam (SYMPTOM  OR DIAGNOSIS REQUIRED)    Answer:   eval left foot pain    Order Specific Question:   Preferred imaging location?    Answer:   [name]   No orders of the defined types were placed in this encounter.    Discussed warning signs or symptoms. Please see discharge instructions. Patient expresses understanding.   The above documentation has been reviewed and is accurate and complete [name], M.D.   Total encounter time 30 minutes including face-to-face time with the patient and, reviewing past medical record, and charting on the date of service.   Discussed treatment plan and options

## 2021-07-05 ENCOUNTER — Other Ambulatory Visit: Payer: Self-pay

## 2021-07-05 ENCOUNTER — Ambulatory Visit: Payer: Self-pay

## 2021-07-05 ENCOUNTER — Ambulatory Visit (INDEPENDENT_AMBULATORY_CARE_PROVIDER_SITE_OTHER): Payer: Medicare PPO | Admitting: Family Medicine

## 2021-07-05 ENCOUNTER — Ambulatory Visit (INDEPENDENT_AMBULATORY_CARE_PROVIDER_SITE_OTHER): Payer: Medicare PPO

## 2021-07-05 VITALS — BP 124/80 | HR 67 | Ht 64.0 in | Wt 124.6 lb

## 2021-07-05 DIAGNOSIS — M79672 Pain in left foot: Secondary | ICD-10-CM

## 2021-07-05 NOTE — Patient Instructions (Signed)
Thank you for coming in today.   Please get an Xray today before you leave   Please use Voltaren gel (Generic Diclofenac Gel) up to 4x daily for pain as needed.  This is available over-the-counter as both the name brand Voltaren gel and the generic diclofenac gel.   Do your exercises.   If not improving let me know I will refer to PT.   Use the arch straps.   Let me know if you need anything.   I think the vertebroplasty will help your husband.

## 2021-07-09 NOTE — Progress Notes (Signed)
Left foot x-ray shows some arthritis in the midfoot.  Additionally a bunion and a bunionette are present.

## 2021-07-15 ENCOUNTER — Telehealth: Payer: Self-pay | Admitting: Nurse Practitioner

## 2021-07-15 NOTE — Chronic Care Management (AMB) (Signed)
  Chronic Care Management   Outreach Note  07/15/2021 Name: Patricia Peck MRN: [id number] DOB: August 26, 1946  Referred by: No primary care provider on file. Reason for referral : No chief complaint on file.   An unsuccessful telephone outreach was attempted today. The patient was referred to the pharmacist for assistance with care management and care coordination.   Follow Up Plan:   Tatjana Dellinger Upstream Scheduler

## 2021-07-16 ENCOUNTER — Ambulatory Visit (INDEPENDENT_AMBULATORY_CARE_PROVIDER_SITE_OTHER): Payer: Medicare PPO | Admitting: Nurse Practitioner

## 2021-07-16 ENCOUNTER — Encounter: Payer: Self-pay | Admitting: Nurse Practitioner

## 2021-07-16 ENCOUNTER — Other Ambulatory Visit: Payer: Self-pay

## 2021-07-16 VITALS — BP 100/82 | HR 66 | Temp 97.0°F | Wt 124.0 lb

## 2021-07-16 DIAGNOSIS — I10 Essential (primary) hypertension: Secondary | ICD-10-CM | POA: Diagnosis not present

## 2021-07-16 DIAGNOSIS — E7800 Pure hypercholesterolemia, unspecified: Secondary | ICD-10-CM

## 2021-07-16 DIAGNOSIS — K219 Gastro-esophageal reflux disease without esophagitis: Secondary | ICD-10-CM

## 2021-07-16 DIAGNOSIS — F99 Mental disorder, not otherwise specified: Secondary | ICD-10-CM

## 2021-07-16 DIAGNOSIS — E038 Other specified hypothyroidism: Secondary | ICD-10-CM

## 2021-07-16 DIAGNOSIS — J4520 Mild intermittent asthma, uncomplicated: Secondary | ICD-10-CM | POA: Insufficient documentation

## 2021-07-16 DIAGNOSIS — F5105 Insomnia due to other mental disorder: Secondary | ICD-10-CM

## 2021-07-16 MED ORDER — TRAZODONE HCL 50 MG PO TABS: 50.0000 mg | ORAL_TABLET | Freq: Every day | ORAL | 3 refills | Status: DC

## 2021-07-16 NOTE — Assessment & Plan Note (Signed)
Stable with trazodone '50mg'$  at hs Refill sent

## 2021-07-16 NOTE — Progress Notes (Signed)
Subjective:  Patient ID: Patricia Peck, female    DOB: [birth year]  Age: 75 y.o. MRN: [id number]  CC: Establish Care (TOC-Dr. C/Medication review/Declines flu vaccine today. )  HPI Hx of PAF and Hyperlipidemia managed by Cardiology: Dr. Stanford Breed, last OV 04/2021 Current use of xarelto, zetia and metoprolol  Insomnia Stable with trazodone '50mg'$  at hs Refill sent  GERD controlled with pepcid '10mg'$  OTC  Hypothyroidism Repeat TSH and T4 Maintain current dose  Mild intermittent asthma without complication Stable with advair daily Refill sent Denies any need for albuterol  Essential hypertension BP at goal with bumex, metoprolol, and potassium Repeat BMP today BP Readings from Last 3 Encounters:  07/16/21 100/82  07/05/21 124/80  04/23/21 138/88   Maintain current doses BP Readings from Last 3 Encounters:  07/16/21 100/82  07/05/21 124/80  04/23/21 138/88    Wt Readings from Last 3 Encounters:  07/16/21 124 lb (56.2 kg)  07/05/21 124 lb 9.6 oz (56.5 kg)  04/23/21 123 lb (55.8 kg)    Reviewed past Medical, Social and Family history today.  Outpatient Medications Prior to Visit  Medication Sig Dispense Refill   ADVAIR DISKUS 100-50 MCG/DOSE AEPB Inhale 1 puff into the lungs in the morning and at bedtime. Maintain upcoming appt for additional refill 60 each 0   albuterol (VENTOLIN HFA) 108 (90 Base) MCG/ACT inhaler Inhale 1 puff into the lungs every 6 (six) hours as needed.     Alpha-D-Galactosidase (BEANO PO) Take 1 tablet by mouth 2 (two) times daily.     atorvastatin (LIPITOR) 80 MG tablet Take 1 tablet (80 mg total) by mouth daily. 90 tablet 3   Bepotastine Besilate 1.5 % SOLN Place 1 drop into both eyes 2 (two) times daily.     bumetanide (BUMEX) 1 MG tablet Take 1 mg by mouth 2 (two) times daily.     Calcium Carbonate-Vitamin D 600-400 MG-UNIT tablet Take 1 tablet by mouth 2 (two) times daily.     Cholecalciferol (VITAMIN D-3) 5000 UNITS TABS Take 5,000 Units by  mouth 3 (three) times a week. Tuesday Thursday Saturday     denosumab (PROLIA) 60 MG/ML SOLN injection Inject 60 mg into the skin once. Administer in upper arm, thigh, or abdomen (Patient taking differently: Inject 60 mg into the skin every 6 (six) months. Administer in upper arm, thigh, or abdomen) 180 mL 0   estradiol (ESTRACE) 0.1 MG/GM vaginal cream Place 1 Applicatorful vaginally 2 (two) times a week. Sunday and Wednesday     famotidine (PEPCID AC) 10 MG chewable tablet Chew 1 tablet (10 mg total) by mouth daily. 90 tablet prm   fluticasone (FLONASE) 50 MCG/ACT nasal spray Place 1 spray into both nostrils daily.     ipratropium (ATROVENT) 0.06 % nasal spray Place 2 sprays into both nostrils 3 (three) times daily. 15 mL 1   levocetirizine (XYZAL) 5 MG tablet Take 1 tablet (5 mg total) by mouth daily. 90 tablet 1   levothyroxine (SYNTHROID) 50 MCG tablet Take 1 tablet (50 mcg total) by mouth daily. 90 tablet 0   metoprolol succinate (TOPROL-XL) 50 MG 24 hr tablet Take 1 tablet (50 mg total) by mouth daily. Maintain upcoming appt for additional refill 90 tablet 2   OVER THE COUNTER MEDICATION Take 1 tablet by mouth daily. Bounty  Hair,Nail, and Skin daily gummi.     potassium chloride (KLOR-CON) 10 MEQ tablet TAKE 1 TABLET TWICE DAILY 180 tablet 1   rivaroxaban (XARELTO) 20 MG TABS tablet  Take 1 tablet (20 mg total) by mouth daily with supper. 90 tablet 1   rOPINIRole (REQUIP) 0.25 MG tablet Take 1 tablet (0.25 mg total) by mouth at bedtime. 90 tablet 3   traZODone (DESYREL) 50 MG tablet Take 50 mg by mouth at bedtime.     zolpidem (AMBIEN) 5 MG tablet TAKE 1 TABLET(5 MG) BY MOUTH AT BEDTIME AS NEEDED FOR SLEEP 30 tablet 2   amLODipine (NORVASC) 5 MG tablet Take 1 tablet (5 mg total) by mouth daily. 90 tablet 2   ezetimibe (ZETIA) 10 MG tablet Take 1 tablet (10 mg total) by mouth daily. 90 tablet 3   No facility-administered medications prior to visit.    ROS See HPI  Objective:  BP 100/82  (BP Location: Left Arm, Patient Position: Sitting, Cuff Size: Normal)   Pulse 66   Temp (!) 97 F (36.1 C) (Temporal)   Wt 124 lb (56.2 kg)   SpO2 97%   BMI 21.28 kg/m   Physical Exam Vitals reviewed.  Cardiovascular:     Rate and Rhythm: Normal rate. Rhythm irregular.     Pulses: Normal pulses.     Heart sounds: Normal heart sounds.  Pulmonary:     Effort: Pulmonary effort is normal.     Breath sounds: Normal breath sounds.  Musculoskeletal:     Right lower leg: No edema.     Left lower leg: No edema.  Skin:    General: Skin is warm and dry.     Findings: No bruising.  Neurological:     Mental Status: She is alert and oriented to person, place, and time.   Assessment & Plan:  This visit occurred during the SARS-CoV-2 public health emergency.  Safety protocols were in place, including screening questions prior to the visit, additional usage of staff PPE, and extensive cleaning of exam room while observing appropriate contact time as indicated for disinfecting solutions.   Luz was seen today for establish care.  Diagnoses and all orders for this visit:  Essential hypertension -     Basic metabolic panel; Future  Gastroesophageal reflux disease without esophagitis  Other specified hypothyroidism -     TSH; Future -     T4, free; Future  Mild intermittent asthma without complication  Insomnia due to other mental disorder -     traZODone (DESYREL) 50 MG tablet; Take 1 tablet (50 mg total) by mouth at bedtime.  Hypercholesterolemia -     Lipid panel; Future   Problem List Items Addressed This Visit       Cardiovascular and Mediastinum   Essential hypertension - Primary    BP at goal with bumex, metoprolol, and potassium Repeat BMP today BP Readings from Last 3 Encounters:  07/16/21 100/82  07/05/21 124/80  04/23/21 138/88   Maintain current doses      Relevant Medications   bumetanide (BUMEX) 1 MG tablet   Other Relevant Orders   Basic metabolic  panel     Respiratory   Mild intermittent asthma without complication    Stable with advair daily Refill sent Denies any need for albuterol      Relevant Medications   albuterol (VENTOLIN HFA) 108 (90 Base) MCG/ACT inhaler     Digestive   GERD    controlled with pepcid '10mg'$  OTC        Endocrine   Hypothyroidism    Repeat TSH and T4 Maintain current dose      Relevant Orders   TSH  T4, free     Other   Hypercholesterolemia   Relevant Medications   bumetanide (BUMEX) 1 MG tablet   Other Relevant Orders   Lipid panel   Insomnia    Stable with trazodone '50mg'$  at hs Refill sent      Relevant Medications   traZODone (DESYREL) 50 MG tablet     Follow-up: Return in about 6 months (around 01/13/2022) for hypothyroidism, HTN, hyperlipidemia (fasting).  Wilfred Lacy, NP

## 2021-07-16 NOTE — Assessment & Plan Note (Signed)
Repeat TSH and T4 Maintain current dose

## 2021-07-16 NOTE — Patient Instructions (Signed)
Schedule lab appt. You need to be fasting 6-8hrs prior to blood draw Ok to drink water.

## 2021-07-16 NOTE — Assessment & Plan Note (Signed)
BP at goal with bumex, metoprolol, and potassium Repeat BMP today BP Readings from Last 3 Encounters:  07/16/21 100/82  07/05/21 124/80  04/23/21 138/88   Maintain current doses

## 2021-07-16 NOTE — Assessment & Plan Note (Signed)
controlled with pepcid '10mg'$  OTC

## 2021-07-16 NOTE — Assessment & Plan Note (Signed)
Stable with advair daily Refill sent Denies any need for albuterol

## 2021-07-19 ENCOUNTER — Other Ambulatory Visit: Payer: Self-pay

## 2021-07-19 ENCOUNTER — Other Ambulatory Visit (INDEPENDENT_AMBULATORY_CARE_PROVIDER_SITE_OTHER): Payer: Medicare PPO

## 2021-07-19 DIAGNOSIS — E876 Hypokalemia: Secondary | ICD-10-CM

## 2021-07-19 DIAGNOSIS — E7800 Pure hypercholesterolemia, unspecified: Secondary | ICD-10-CM | POA: Diagnosis not present

## 2021-07-19 DIAGNOSIS — E038 Other specified hypothyroidism: Secondary | ICD-10-CM | POA: Diagnosis not present

## 2021-07-19 DIAGNOSIS — I10 Essential (primary) hypertension: Secondary | ICD-10-CM

## 2021-07-19 DIAGNOSIS — T502X5A Adverse effect of carbonic-anhydrase inhibitors, benzothiadiazides and other diuretics, initial encounter: Secondary | ICD-10-CM

## 2021-07-19 LAB — LIPID PANEL
Cholesterol: 107 mg/dL (ref 0–200)
HDL: 42 mg/dL (ref >39.00)
LDL Cholesterol: 44 mg/dL (ref 0–99)
NonHDL: 64.53
Total CHOL/HDL Ratio: 3
Triglycerides: 103 mg/dL (ref 0.0–149.0)
VLDL: 20.6 mg/dL (ref 0.0–40.0)

## 2021-07-19 LAB — BASIC METABOLIC PANEL
BUN: 11 mg/dL (ref 6–23)
CO2: 26 mEq/L (ref 19–32)
Calcium: 8.8 mg/dL (ref 8.4–10.5)
Chloride: 102 mEq/L (ref 96–112)
Creatinine, Ser: 0.82 mg/dL (ref 0.40–1.20)
GFR: 69.64 mL/min (ref >60.00)
Glucose, Bld: 89 mg/dL (ref 70–99)
Potassium: 3.1 mEq/L — ABNORMAL LOW (ref 3.5–5.1)
Sodium: 138 mEq/L (ref 135–145)

## 2021-07-19 LAB — TSH: TSH: 0.99 u[IU]/mL (ref 0.35–5.50)

## 2021-07-19 LAB — T4, FREE: Free T4: 0.88 ng/dL (ref 0.60–1.60)

## 2021-07-19 MED ORDER — POTASSIUM CHLORIDE ER 10 MEQ PO TBCR: 10.0000 meq | EXTENDED_RELEASE_TABLET | Freq: Two times a day (BID) | ORAL | 1 refills | Status: DC

## 2021-07-19 MED ORDER — LEVOTHYROXINE SODIUM 50 MCG PO TABS: 50.0000 ug | ORAL_TABLET | Freq: Every day | ORAL | 3 refills | Status: DC

## 2021-07-19 MED ORDER — BUMETANIDE 1 MG PO TABS: 1.0000 mg | ORAL_TABLET | Freq: Two times a day (BID) | ORAL | 1 refills | Status: DC

## 2021-07-19 NOTE — Addendum Note (Signed)
Addended by: Wilfred Lacy L on: 07/19/2021 10:46 PM   Modules accepted: Orders

## 2021-07-19 NOTE — Progress Notes (Signed)
Per the orders of NP Wilfred Lacy pt is here for labs pt tolerated draw well.

## 2021-07-29 ENCOUNTER — Telehealth: Payer: Self-pay

## 2021-07-29 DIAGNOSIS — K219 Gastro-esophageal reflux disease without esophagitis: Secondary | ICD-10-CM

## 2021-07-29 NOTE — Telephone Encounter (Signed)
Spoke to patient in regards to her prescription for Nexium 40 mg due to receiving a refill request from [place] ([place]).   Rx is not on active list and looks a if it has been d/c on 02/13/21. She states that she has been taking this daily and would like to get a refill on this.  Please review and advise.  Thanks.  Dm/cma

## 2021-07-30 MED ORDER — ESOMEPRAZOLE MAGNESIUM 20 MG PO CPDR: 20.0000 mg | DELAYED_RELEASE_CAPSULE | Freq: Every day | ORAL | 1 refills | Status: DC

## 2021-07-30 NOTE — Addendum Note (Signed)
Addended by: Wilfred Lacy L on: 07/30/2021 11:06 AM   Modules accepted: Orders

## 2021-07-30 NOTE — Telephone Encounter (Signed)
Yes she is taking both.

## 2021-07-30 NOTE — Telephone Encounter (Signed)
Patient notified VIA phone. Dm/cma  

## 2021-08-12 ENCOUNTER — Telehealth: Payer: Self-pay | Admitting: Nurse Practitioner

## 2021-08-12 NOTE — Chronic Care Management (AMB) (Signed)
  Chronic Care Management   Note  08/12/2021 Name: Patricia Peck MRN: [id number] DOB: 1946/07/03  Patricia Peck is a 75 y.o. year old female who is a primary care patient of Nche, Charlene Brooke, NP. I reached out to [name] by phone today in response to a referral sent by Patricia Peck's PCP, Nche, Charlene Brooke, NP.   Patricia Peck was given information about Chronic Care Management services today including:  CCM service includes personalized support from designated clinical staff supervised by her physician, including individualized plan of care and coordination with other care providers 24/7 contact phone numbers for assistance for urgent and routine care needs. Service will only be billed when office clinical staff spend 20 minutes or more in a month to coordinate care. Only one practitioner may furnish and bill the service in a calendar month. The patient may stop CCM services at any time (effective at the end of the month) by phone call to the office staff.   Patient agreed to services and verbal consent obtained.   Follow up plan:   Tatjana Secretary/administrator

## 2021-08-13 ENCOUNTER — Other Ambulatory Visit: Payer: Self-pay | Admitting: Cardiology

## 2021-08-13 NOTE — Telephone Encounter (Signed)
Prescription refill request for Xarelto received.   Indication: afib  Last office visit: Patricia Peck, 04/23/2021 Weight: 56.2 kg  Age: 75 yo  Scr: 0.82, 07/19/2021 CrCl: 52 ml/min   Refill sent

## 2021-08-14 ENCOUNTER — Other Ambulatory Visit: Payer: Self-pay | Admitting: Cardiology

## 2021-08-14 NOTE — Telephone Encounter (Signed)
Prescription refill request for Xarelto received.  Indication:Afib Last office visit:6/22 Weight:56.2 kg Age:75 Scr:0.8 CrCl:53.08 ml/min  Prescription refilled

## 2021-08-16 IMAGING — MG MM DIGITAL SCREENING BILAT W/ TOMO AND CAD
8 series · 8 of 24 positions shown · non-contrast
Comparison: Previous exam(s).

CLINICAL DATA: Screening.

EXAM:
DIGITAL SCREENING BILATERAL MAMMOGRAM WITH TOMOSYNTHESIS AND CAD
TECHNIQUE: Bilateral screening digital craniocaudal and mediolateral oblique
mammograms were obtained. Bilateral screening digital breast
tomosynthesis was performed. The images were evaluated with
computer-aided detection.

[L MLO synth-2D]
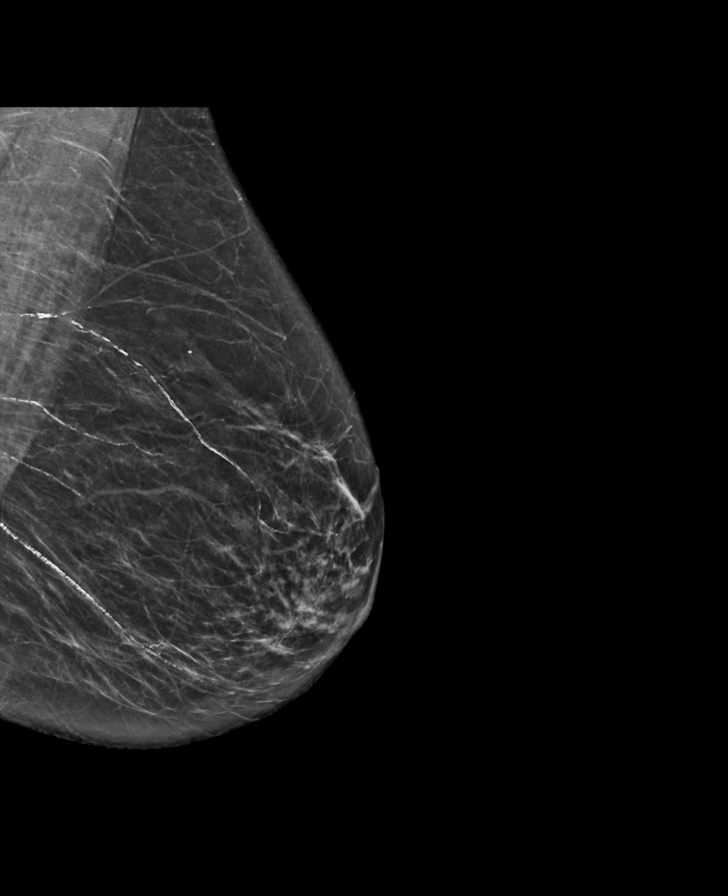

[L CC synth-2D]
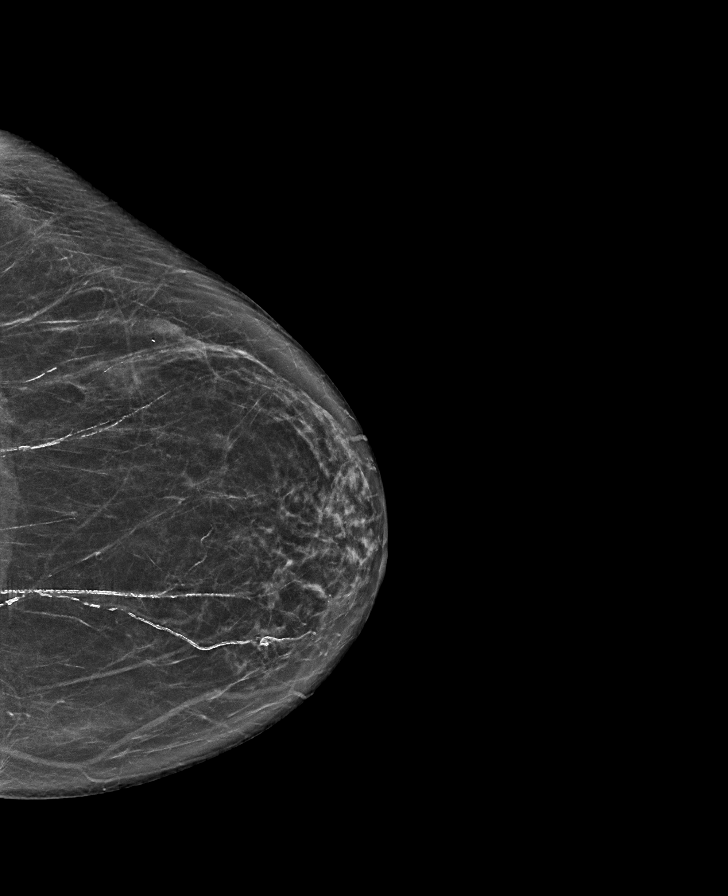

[R MLO synth-2D]
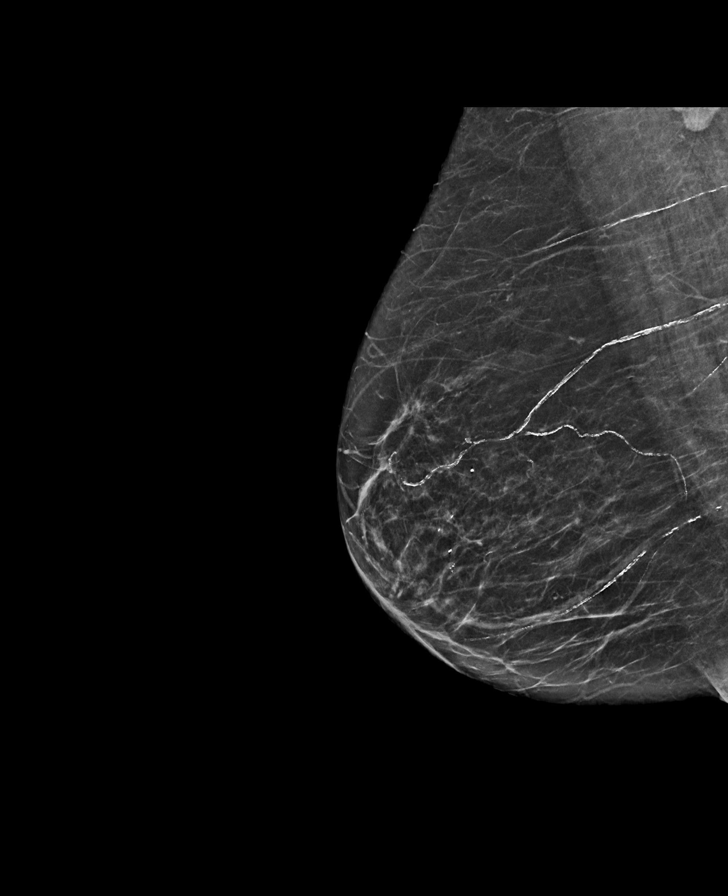

[R CC synth-2D]
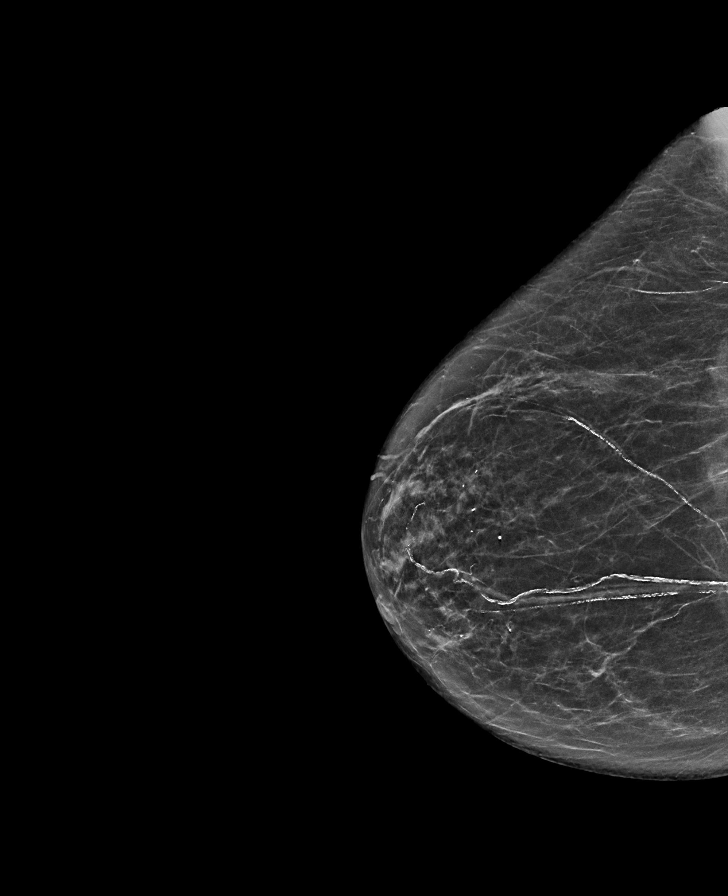

[R CC tomo · tomo slice 31/62.0]
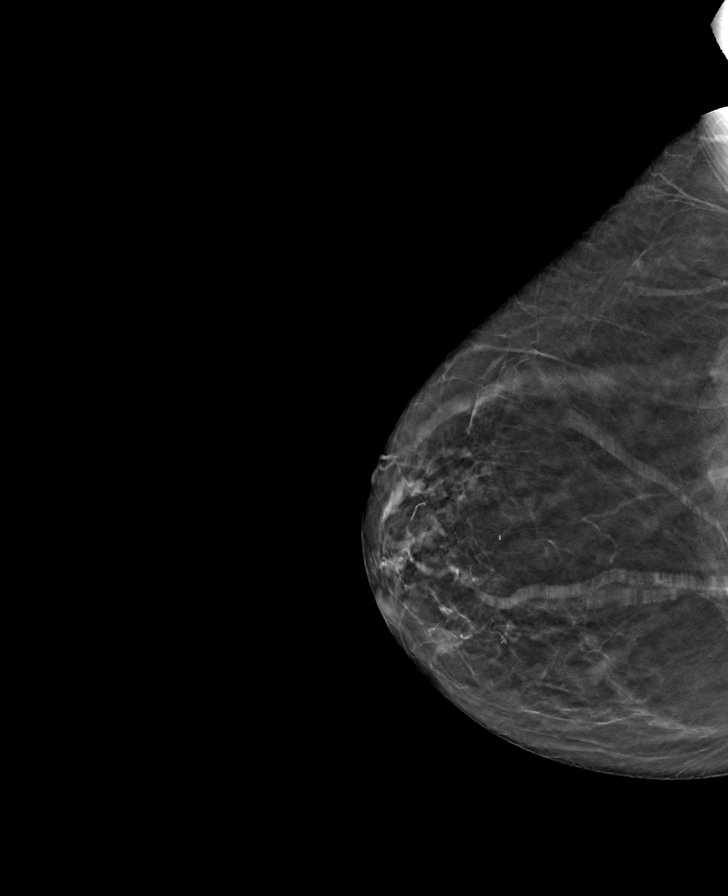

[L MLO tomo · tomo slice 32/63.0]
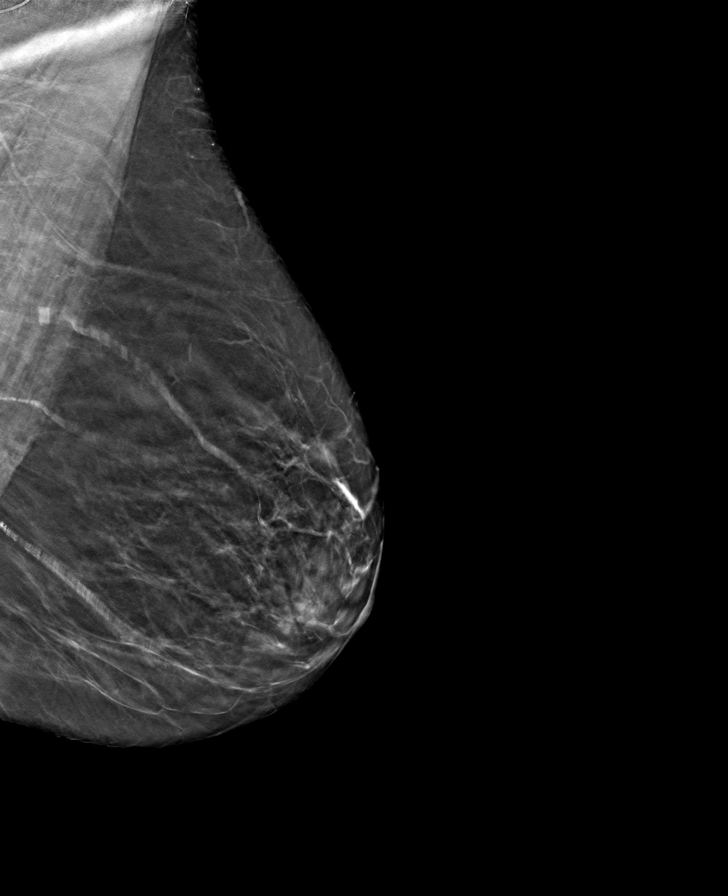

[R MLO tomo · tomo slice 31/60.0]
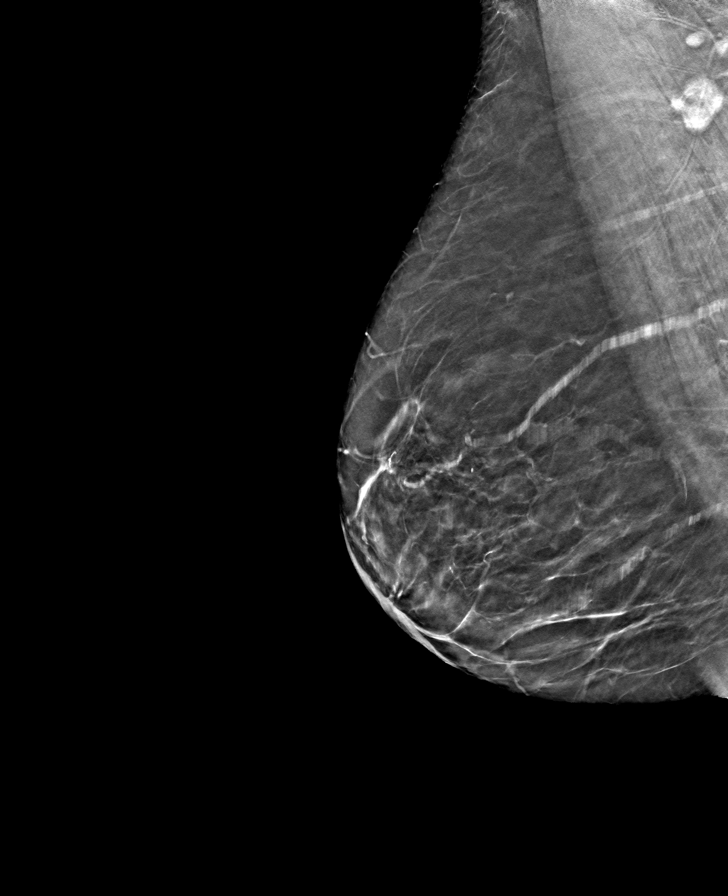

[L CC tomo · tomo slice 33/64.0]
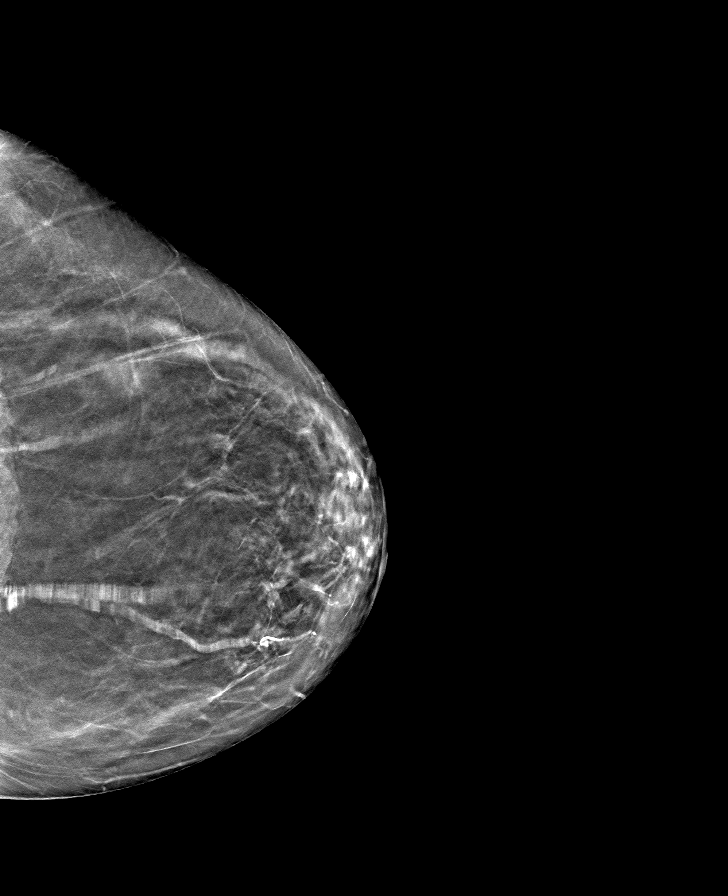

[8 of 24 positions shown; findings below may reference images not displayed]

ACR Breast Density Category b: There are scattered areas of
fibroglandular density.
FINDINGS: There are no findings suspicious for malignancy.
IMPRESSION: No mammographic evidence of malignancy. A result letter of this
screening mammogram will be mailed directly to the patient.

RECOMMENDATION:
Screening mammogram in one year. (Code:51-O-LD2)

BI-RADS CATEGORY  1: Negative.

## 2021-09-16 ENCOUNTER — Telehealth: Payer: Self-pay | Admitting: Nurse Practitioner

## 2021-09-16 NOTE — Telephone Encounter (Signed)
What is the name of the medication? esomeprazole ([place]) 20 MG capsule [[id number]]   Have you contacted your pharmacy to request a refill? She is needing a refill on this script, she can not have a generic.   Which pharmacy would you like this sent to? [id number] [address], [address] AT [facility] [address]  [address] [place] [address]  Phone:  [phone]  Fax:  [phone]  DEA #:  [id number]   Patient notified that their request is being sent to the clinical staff for review and that they should receive a call once it is complete. If they do not receive a call within 72 hours they can check with their pharmacy or our office.

## 2021-09-20 NOTE — Telephone Encounter (Signed)
Medication on file with pharmacy along with refill.

## 2021-09-30 NOTE — Telephone Encounter (Signed)
Pt called bc her pharmacy will not refill the Nexium on file.

## 2021-10-03 ENCOUNTER — Telehealth: Payer: Medicare PPO

## 2021-10-03 ENCOUNTER — Telehealth: Payer: Self-pay

## 2021-10-03 NOTE — Progress Notes (Signed)
Chronic Care Management Pharmacy Assistant   Name: Patricia Peck  MRN: [id number] DOB: 01/30/1946  Chart review for the clinical pharmacist on 10/17/2021 at 9:30 am.  Conditions to be addressed/monitored: HTN, HLD, Anxiety, Depression, GERD, Asthma, Osteoporosis, and PAF, Cerebrovascular disease,stenosis of left subclavian artery,IBS,  Hypothyroidism, Insomnia   Primary concerns for visit include: Patient states she has been having issue refilling her Nexium,but denies any affordability issue at this time.Patient states her pharmacy will tell her she does not need any refills but then tell her she does.Patient states she has to wait for a response from her PCP office, and still has not heard anything from them.Patient express she is confused why she is having a hard time getting her Nexium refill.   Recent office visits:  07/29/2021 Wilfred Lacy NP (PCP) start Nexium 20 mg daily 07/16/2021 Wilfred Lacy NP (PCP) Stop Zolpidem,  return in 6 months 05/28/2021 Asisa Orene Desanctis CMA (PCP office) Prolia injection given  Recent consult visits:  07/05/2021 Dr. Georgina Snell MD (Sports Medicine) No Medication Changes noted 04/23/2021 Dr. Stanford Breed MD (Cardiology) No medication changes noted, return in 6 months  Hospital visits:  None in previous 6 months   Initial Questions:  Have you seen any other providers since your last visit?   Patient denies seeing any other providers since her last visit with her provider.  Any changes in your medications or health?   Patient denies any changes at this time.  Any side effects from any medications?   Patient denies any side effects from her medications.  Do you have an symptoms or problems not managed by your medications?   Patient denies any symptoms or problems that are not managed by her medications.  Any concerns about your health right now?   Patient denies any concerns about her health.  Has your provider asked that you check blood pressure,  blood sugar, or follow special diet at home?   Patient states she does not check her blood pressure at home because her blood is "good".  Do you get any type of exercise on a regular basis?   None ID  Can you think of a goal you would like to reach for your health?   Patient will inform the clinical pharmacist.  Do you have any problems getting your medications?   Patient states she has been having issue refilling her Nexium,but denies any affordability issue at this time.Patient states her pharmacy will tell her she does not need any refills but then tell her she does.Patient states she has to wait for a response from her PCP office, and still has not heard anything from them.Patient express she is confused why she is having a hard time getting her Nexium refill.  Is there anything that you would like to discuss during the appointment?    Refilling her Nexium  Overall health  Please bring medications and supplements to appointment  Patient is aware to have her medications and supplements on hand at her appointment.  Patient is aware she can return my call if she has any questions or concerns.  Medications: Outpatient Encounter Medications as of 10/03/2021  Medication Sig   ADVAIR DISKUS 100-50 MCG/DOSE AEPB Inhale 1 puff into the lungs in the morning and at bedtime. Maintain upcoming appt for additional refill   albuterol (VENTOLIN HFA) 108 (90 Base) MCG/ACT inhaler Inhale 1 puff into the lungs every 6 (six) hours as needed.   Alpha-D-Galactosidase (BEANO PO) Take 1 tablet by mouth 2 (  two) times daily.   amLODipine (NORVASC) 5 MG tablet Take 1 tablet (5 mg total) by mouth daily.   atorvastatin (LIPITOR) 80 MG tablet Take 1 tablet (80 mg total) by mouth daily.   Bepotastine Besilate 1.5 % SOLN Place 1 drop into both eyes 2 (two) times daily.   bumetanide (BUMEX) 1 MG tablet Take 1 tablet (1 mg total) by mouth 2 (two) times daily.   Calcium Carbonate-Vitamin D 600-400 MG-UNIT tablet Take  1 tablet by mouth 2 (two) times daily.   Cholecalciferol (VITAMIN D-3) 5000 UNITS TABS Take 5,000 Units by mouth 3 (three) times a week. Tuesday Thursday Saturday   denosumab (PROLIA) 60 MG/ML SOLN injection Inject 60 mg into the skin once. Administer in upper arm, thigh, or abdomen (Patient taking differently: Inject 60 mg into the skin every 6 (six) months. Administer in upper arm, thigh, or abdomen)   esomeprazole (NEXIUM) 20 MG capsule Take 1 capsule (20 mg total) by mouth daily at 12 noon.   estradiol (ESTRACE) 0.1 MG/GM vaginal cream Place 1 Applicatorful vaginally 2 (two) times a week. Sunday and Wednesday   ezetimibe (ZETIA) 10 MG tablet Take 1 tablet (10 mg total) by mouth daily.   famotidine (PEPCID AC) 10 MG chewable tablet Chew 1 tablet (10 mg total) by mouth daily.   fluticasone (FLONASE) 50 MCG/ACT nasal spray Place 1 spray into both nostrils daily.   ipratropium (ATROVENT) 0.06 % nasal spray Place 2 sprays into both nostrils 3 (three) times daily.   levocetirizine (XYZAL) 5 MG tablet Take 1 tablet (5 mg total) by mouth daily.   levothyroxine (SYNTHROID) 50 MCG tablet Take 1 tablet (50 mcg total) by mouth daily.   metoprolol succinate (TOPROL-XL) 50 MG 24 hr tablet Take 1 tablet (50 mg total) by mouth daily. Maintain upcoming appt for additional refill   OVER THE COUNTER MEDICATION Take 1 tablet by mouth daily. Bounty  Hair,Nail, and Skin daily gummi.   potassium chloride (KLOR-CON) 10 MEQ tablet Take 1 tablet (10 mEq total) by mouth 2 (two) times daily.   rOPINIRole (REQUIP) 0.25 MG tablet Take 1 tablet (0.25 mg total) by mouth at bedtime.   traZODone (DESYREL) 50 MG tablet Take 1 tablet (50 mg total) by mouth at bedtime.   XARELTO 20 MG TABS tablet TAKE 1 TABLET(20 MG) BY MOUTH DAILY WITH SUPPER   [DISCONTINUED] HYDROcodone-acetaminophen (NORCO/VICODIN) 5-325 MG tablet Take 1 tablet by mouth every 6 (six) hours as needed for moderate pain. (Patient not taking: No sig reported)   No  facility-administered encounter medications on file as of 10/03/2021.    Care Gaps: Influenza Vaccine Star Rating Drugs: Atorvastatin 80 mg last filled 01/03/2021 90 day supply at [facility]. Medication Fill Gaps: Bumetanide 1 MG  last filled 01/03/2021 90 day supply Ezetimibe 10 MG  last filled 01/10/2021 90 day supply Levothyroxine 50 MCG  last filled 01/24/2021 90 day supply Metoprolol succinate 50 MG 24 hr  last filled 01/24/2021 90 day supply Potassium chloride 10 MEQ  last filled 01/24/2021 90 day supply XARELTO 20 MG  last filled 01/24/2021 90 day supply  [place] Pharmacist Assistant [phone]

## 2021-10-03 NOTE — Telephone Encounter (Signed)
Called the pharmacy twice and was unable to get anyone to answer and stay on the phone. I was on hold for 71min, will call again later.

## 2021-10-08 ENCOUNTER — Telehealth: Payer: Self-pay | Admitting: Nurse Practitioner

## 2021-10-08 NOTE — Telephone Encounter (Signed)
Pt dropped off form to be filled out for insurance ... I put in [place]'s front folder.

## 2021-10-09 ENCOUNTER — Ambulatory Visit (INDEPENDENT_AMBULATORY_CARE_PROVIDER_SITE_OTHER): Payer: Medicare PPO

## 2021-10-09 DIAGNOSIS — Z0000 Encounter for general adult medical examination without abnormal findings: Secondary | ICD-10-CM | POA: Diagnosis not present

## 2021-10-09 NOTE — Progress Notes (Signed)
Subjective:   Patricia Peck is a 75 y.o. female who presents for Medicare Annual (Subsequent) preventive examination.  I connected with Elaina Pattee today by telephone and verified that I am speaking with the correct person using two identifiers. Location patient: home Location provider: work Persons participating in the virtual visit: patient, provider.   I discussed the limitations, risks, security and privacy concerns of performing an evaluation and management service by telephone and the availability of in person appointments. I also discussed with the patient that there may be a patient responsible charge related to this service. The patient expressed understanding and verbally consented to this telephonic visit.    Interactive audio and video telecommunications were attempted between this provider and patient, however failed, due to patient having technical difficulties OR patient did not have access to video capability.  We continued and completed visit with audio only.    Review of Systems     Cardiac Risk Factors include: advanced age (>43men, >66 women);dyslipidemia;hypertension     Objective:    Today's Vitals   There is no height or weight on file to calculate BMI.  Advanced Directives 10/09/2021 03/06/2021 02/12/2021 02/05/2021 12/04/2020 06/21/2020 05/18/2019  Does Patient Have a Medical Advance Directive? Yes Yes Yes Yes Yes Yes Yes  Type of Paramedic of [place];Living will [place];Living will [place];Living will [place];Living will - [place];Living will [place];Living will  Does patient want to make changes to medical advance directive? - - - - - - No - Patient declined  Copy of [place] in Chart? No - copy requested No - copy requested - No - copy requested - No - copy requested No - copy requested  Pre-existing out of facility  DNR order (yellow form or pink MOST form) - - - - - - -    Current Medications (verified) Outpatient Encounter Medications as of 10/09/2021  Medication Sig   ADVAIR DISKUS 100-50 MCG/DOSE AEPB Inhale 1 puff into the lungs in the morning and at bedtime. Maintain upcoming appt for additional refill   albuterol (VENTOLIN HFA) 108 (90 Base) MCG/ACT inhaler Inhale 1 puff into the lungs every 6 (six) hours as needed.   Alpha-D-Galactosidase (BEANO PO) Take 1 tablet by mouth 2 (two) times daily.   atorvastatin (LIPITOR) 80 MG tablet Take 1 tablet (80 mg total) by mouth daily.   Bepotastine Besilate 1.5 % SOLN Place 1 drop into both eyes 2 (two) times daily.   bumetanide (BUMEX) 1 MG tablet Take 1 tablet (1 mg total) by mouth 2 (two) times daily.   Calcium Carbonate-Vitamin D 600-400 MG-UNIT tablet Take 1 tablet by mouth 2 (two) times daily.   Cholecalciferol (VITAMIN D-3) 5000 UNITS TABS Take 5,000 Units by mouth 3 (three) times a week. Tuesday Thursday Saturday   denosumab (PROLIA) 60 MG/ML SOLN injection Inject 60 mg into the skin once. Administer in upper arm, thigh, or abdomen (Patient taking differently: Inject 60 mg into the skin every 6 (six) months. Administer in upper arm, thigh, or abdomen)   esomeprazole (NEXIUM) 20 MG capsule Take 1 capsule (20 mg total) by mouth daily at 12 noon.   estradiol (ESTRACE) 0.1 MG/GM vaginal cream Place 1 Applicatorful vaginally 2 (two) times a week. Sunday and Wednesday   famotidine (PEPCID AC) 10 MG chewable tablet Chew 1 tablet (10 mg total) by mouth daily.   fluticasone (FLONASE) 50 MCG/ACT  nasal spray Place 1 spray into both nostrils daily.   levocetirizine (XYZAL) 5 MG tablet Take 1 tablet (5 mg total) by mouth daily.   levothyroxine (SYNTHROID) 50 MCG tablet Take 1 tablet (50 mcg total) by mouth daily.   metoprolol succinate (TOPROL-XL) 50 MG 24 hr tablet Take 1 tablet (50 mg total) by mouth daily. Maintain upcoming appt for additional refill   OVER THE  COUNTER MEDICATION Take 1 tablet by mouth daily. Bounty  Hair,Nail, and Skin daily gummi.   potassium chloride (KLOR-CON) 10 MEQ tablet Take 1 tablet (10 mEq total) by mouth 2 (two) times daily.   rOPINIRole (REQUIP) 0.25 MG tablet Take 1 tablet (0.25 mg total) by mouth at bedtime.   traZODone (DESYREL) 50 MG tablet Take 1 tablet (50 mg total) by mouth at bedtime.   XARELTO 20 MG TABS tablet TAKE 1 TABLET(20 MG) BY MOUTH DAILY WITH SUPPER   amLODipine (NORVASC) 5 MG tablet Take 1 tablet (5 mg total) by mouth daily.   ezetimibe (ZETIA) 10 MG tablet Take 1 tablet (10 mg total) by mouth daily.   ipratropium (ATROVENT) 0.06 % nasal spray Place 2 sprays into both nostrils 3 (three) times daily. (Patient not taking: Reported on 10/09/2021)   [DISCONTINUED] HYDROcodone-acetaminophen (NORCO/VICODIN) 5-325 MG tablet Take 1 tablet by mouth every 6 (six) hours as needed for moderate pain. (Patient not taking: No sig reported)   No facility-administered encounter medications on file as of 10/09/2021.    Allergies (verified) Patient has no known allergies.   History: Past Medical History:  Diagnosis Date   Allergy    Arthritis    hands   Asthma    uses an inhaler   Atrial fibrillation (HCC)    Atrial flutter (HCC)    Depression    Dysrhythmia    a-fib, on Xarelto   GERD (gastroesophageal reflux disease)    Hyperlipidemia    Hypertension    Hypothyroidism    Osteoporosis    Stroke ([place]) 2017   TIA   TIA (transient ischemic attack)    Past Surgical History:  Procedure Laterality Date   ABDOMINAL HYSTERECTOMY     COLONOSCOPY     MYRINGOTOMY WITH TUBE PLACEMENT Right 01/14/2016   Procedure: RIGHT MYRINGOTOMY WITH T-TUBE PLACEMENT;  Surgeon: Leta Baptist, MD;  Location: [facility];  Service: ENT;  Laterality: Right;   [place] OOPHERECTOMY Bilateral 02/14/2021   Procedure: XI ROBOTIC ASSISTED BILATERAL SALPINGO OOPHORECTOMY;   Surgeon: Everitt Amber, MD;  Location: [facility];  Service: Gynecology;  Laterality: Bilateral;   TEE WITHOUT CARDIOVERSION N/A 12/22/2012   Procedure: TRANSESOPHAGEAL ECHOCARDIOGRAM (TEE);  Surgeon: Lelon Perla, MD;  Location[facility]COPY;  Service: Cardiovascular;  Laterality: N/A;   Family History  Problem Relation Age of Onset   Breast cancer Mother    Drug abuse Son    Depression Son    Stroke Paternal Aunt    Heart disease Paternal Grandfather    Diabetes Maternal Grandfather    Colon cancer Neg Hx    Ovarian cancer Neg Hx    Endometrial cancer Neg Hx    Pancreatic cancer Neg Hx    Prostate cancer Neg Hx    Social History   Socioeconomic History   Marital status: Married    Spouse name: Not on file   Number of children: 2   Years of education: Not on file   Highest education level: Not on file  Occupational History    Employer: RETIRED  Tobacco Use   Smoking status: Never   Smokeless tobacco: Never  Vaping Use   Vaping Use: Never used  Substance and Sexual Activity   Alcohol use: Yes    Comment: Rare   Drug use: No   Sexual activity: Not Currently  Other Topics Concern   Not on file  Social History Narrative   Does have HPOA- sons Eddie Dibbles and Wille Glaser.   Does desire life support if not futile.   Not sure about feeding tubes.   Social Determinants of Health   Financial Resource Strain: Low Risk    Difficulty of Paying Living Expenses: Not hard at all  Food Insecurity: No Food Insecurity   Worried About Charity fundraiser in the Last Year: Never true   [place] in the Last Year: Never true  Transportation Needs: No Transportation Needs   Lack of Transportation (Medical): No   Lack of Transportation (Non-Medical): No  Physical Activity: Inactive   Days of Exercise per Week: 0 days   Minutes of Exercise per Session: 0 min  Stress: No Stress Concern Present   Feeling of Stress : Not at all  Social Connections: Moderately Isolated   Frequency of  Communication with Friends and Family: Twice a week   Frequency of Social Gatherings with Friends and Family: Twice a week   Attends Religious Services: Never   Printmaker: No   Attends Music therapist: Never   Marital Status: Married    Tobacco Counseling Counseling given: Not Answered   Clinical Intake:  Pre-visit preparation completed: Yes  Pain : No/denies pain     Nutritional Risks: None Diabetes: No  How often do you need to have someone help you when you read instructions, pamphlets, or other written materials from your doctor or pharmacy?: 1 - Never  Diabetic?no   Interpreter Needed?: No  Information entered by :: L,Cashawn Yanko:LPN   Activities of Daily Living In your present state of health, do you have any difficulty performing the following activities: 10/09/2021 02/12/2021  Hearing? N N  Vision? N N  Difficulty concentrating or making decisions? N N  Walking or climbing stairs? N N  Dressing or bathing? N N  Doing errands, shopping? N N  Preparing Food and eating ? N -  Using the Toilet? N -  In the past six months, have you accidently leaked urine? N -  Do you have problems with loss of bowel control? N -  Managing your Medications? N -  Managing your Finances? N -  Housekeeping or managing your Housekeeping? N -  Some recent data might be hidden    Patient Care Team: Nche, Charlene Brooke, NP as PCP - General (Internal Medicine) Stanford Breed Denice Bors, MD as PCP - Cardiology (Cardiology) Stanford Breed Denice Bors, MD as Consulting Physician (Cardiology) Leta Baptist, MD as Consulting Physician (Otolaryngology) Mosetta Anis, MD as Referring Physician (Allergy) Germaine Pomfret, [facility] as Pharmacist (Pharmacist)  Indicate any recent Medical Services you may have received from other than [facility] providers in the past year (date may be approximate).     Assessment:   This is a routine wellness examination for  [place].  Hearing/Vision screen Vision Screening - Comments:: Annual eye exams wears glasses   Dietary issues and exercise activities discussed: Current Exercise Habits: The patient does not participate in regular exercise at present, Exercise limited by: None identified   Goals Addressed  This Visit's Progress    Increase physical activity   On track      Depression Screen PHQ 2/9 Scores 10/09/2021 10/09/2021 07/16/2021 07/20/2020 06/21/2020 03/14/2020 11/21/2019  PHQ - 2 Score 0 0 0 0 0 0 -  PHQ- 9 Score - - - - - 2 -  Exception Documentation - - - - - - Other- indicate reason in comment box  Not completed - - - - - - acute    Fall Risk Fall Risk  10/09/2021 07/16/2021 06/21/2020 05/23/2019 05/18/2019  Falls in the past year? 0 0 0 0 0  Number falls in past yr: 0 0 0 - -  Injury with Fall? 0 0 0 - -  Risk for fall due to : - No Fall Risks - - -  Follow up Falls evaluation completed Falls evaluation completed Falls prevention discussed Falls evaluation completed -    FALL RISK PREVENTION PERTAINING TO THE HOME:  Any stairs in or around the home? Yes  If so, are there any without handrails? No  Home free of loose throw rugs in walkways, pet beds, electrical cords, etc? Yes  Adequate lighting in your home to reduce risk of falls? Yes   ASSISTIVE DEVICES UTILIZED TO PREVENT FALLS:  Life alert? No  Use of a cane, walker or w/c? No  Grab bars in the bathroom? No  Shower chair or bench in shower? No  Elevated toilet seat or a handicapped toilet? No    Cognitive Function: Normal cognitive status assessed by direct observation by this Nurse Health Advisor. No abnormalities found.   MMSE - Mini Mental State Exam 04/21/2018 04/21/2017  Orientation to time 5 5  Orientation to Place 5 5  Registration 3 3  Attention/ Calculation 5 0  Recall 3 1  Recall-comments - pt was unable to recall 2 of 3 words  Language- name 2 objects 2 0  Language- repeat 1 1  Language- follow 3  step command 3 3  Language- read & follow direction 1 0  Write a sentence 1 0  Copy design 1 0  Total score 30 18        Immunizations Immunization History  Administered Date(s) Administered   Fluad Quad(high Dose 65+) 06/30/2019   Influenza Split 09/04/2011, 08/11/2012, 08/24/2013, 07/20/2015, 07/29/2016, 08/04/2017, 07/10/2018   Influenza Whole 08/07/2009   Influenza, High Dose Seasonal PF 09/26/2013, 08/28/2014, 11/27/2016, 12/10/2017, 11/18/2018, 06/30/2019, 11/15/2019, 11/13/2020   Influenza,inj,Quad PF,6+ Mos 08/24/2013, 07/20/2015, 07/29/2016, 08/04/2017   Influenza-Unspecified 07/10/2018, 07/14/2020   PFIZER(Purple Top)SARS-COV-2 Vaccination 11/23/2019, 02/02/2020, 04/05/2020   Pneumococcal Conjugate-13 04/12/2014   Pneumococcal Polysaccharide-23 02/05/2006, 02/05/2006, 04/08/2012, 04/08/2012, 04/22/2016, 11/27/2016, 12/10/2017, 11/18/2018, 11/15/2019, 04/05/2020, 11/13/2020   Td 01/29/2005, 04/17/2015   Zoster Recombinat (Shingrix) 02/09/2018, 05/11/2020   Zoster, Live 04/16/2007    TDAP status: Up to date  Flu Vaccine status: Up to date  Pneumococcal vaccine status: Up to date  Covid-19 vaccine status: Completed vaccines  Qualifies for Shingles Vaccine? Yes   Zostavax completed Yes   Shingrix Completed?: Yes  Screening Tests Health Maintenance  Topic Date Due   INFLUENZA VACCINE  06/03/2021   MAMMOGRAM  05/14/2022   TETANUS/TDAP  04/16/2025   Pneumonia Vaccine 46+ Years old  Completed   DEXA SCAN  Completed   COVID-19 Vaccine  Completed   Hepatitis C Screening  Completed   Zoster Vaccines- Shingrix  Completed   HPV VACCINES  Aged Out   COLONOSCOPY (Pts 45-77yrs Insurance coverage will need to be confirmed)  Discontinued  Health Maintenance  Health Maintenance Due  Topic Date Due   INFLUENZA VACCINE  06/03/2021    Colorectal cancer screening: No longer required.   Mammogram status: No longer required due to age.  Bone Density status:  Completed 04/04/2020. Results reflect: Bone density results: OSTEOPENIA. Repeat every 5 years.  Lung Cancer Screening: (Low Dose CT Chest recommended if Age 73-80 years, 30 pack-year currently smoking OR have quit w/in 15years.) does not qualify.   Lung Cancer Screening Referral: n/a  Additional Screening:  Hepatitis C Screening: does not qualify;   Vision Screening: Recommended annual ophthalmology exams for early detection of glaucoma and other disorders of the eye. Is the patient up to date with their annual eye exam?  Yes  Who is the provider or what is the name of the office in which the patient attends annual eye exams? Dr. Gwyndolyn Kaufman  If pt is not established with a provider, would they like to be referred to a provider to establish care? No .   Dental Screening: Recommended annual dental exams for proper oral hygiene  Community Resource Referral / Chronic Care Management: CRR required this visit?  No   CCM required this visit?  No      Plan:     I have personally reviewed and noted the following in the patient's chart:   Medical and social history Use of alcohol, tobacco or illicit drugs  Current medications and supplements including opioid prescriptions.  Functional ability and status Nutritional status Physical activity Advanced directives List of other physicians Hospitalizations, surgeries, and ER visits in previous 12 months Vitals Screenings to include cognitive, depression, and falls Referrals and appointments  In addition, I have reviewed and discussed with patient certain preventive protocols, quality metrics, and best practice recommendations. A written personalized care plan for preventive services as well as general preventive health recommendations were provided to patient.     Randel Pigg, LPN   [id number]   Nurse Notes: none

## 2021-10-09 NOTE — Patient Instructions (Signed)
Patricia Peck , Thank you for taking time to come for your Medicare Wellness Visit. I appreciate your ongoing commitment to your health goals. Please review the following plan we discussed and let me know if I can assist you in the future.   Screening recommendations/referrals: Colonoscopy: no longer required  Mammogram: no longer required  Bone Density: 04/04/2020 Recommended yearly ophthalmology/optometry visit for glaucoma screening and checkup Recommended yearly dental visit for hygiene and checkup  Vaccinations: Influenza vaccine: completed  Pneumococcal vaccine: completed  Tdap vaccine: 04/17/2015 Shingles vaccine: completed     Advanced directives: will provide copies   Conditions/risks identified: none   Next appointment: none    Preventive Care 76 Years and Older, Female Preventive care refers to lifestyle choices and visits with your health care provider that can promote health and wellness. What does preventive care include? A yearly physical exam. This is also called an annual well check. Dental exams once or twice a year. Routine eye exams. Ask your health care provider how often you should have your eyes checked. Personal lifestyle choices, including: Daily care of your teeth and gums. Regular physical activity. Eating a healthy diet. Avoiding tobacco and drug use. Limiting alcohol use. Practicing safe sex. Taking low-dose aspirin every day. Taking vitamin and mineral supplements as recommended by your health care provider. What happens during an annual well check? The services and screenings done by your health care provider during your annual well check will depend on your age, overall health, lifestyle risk factors, and family history of disease. Counseling  Your health care provider may ask you questions about your: Alcohol use. Tobacco use. Drug use. Emotional well-being. Home and relationship well-being. Sexual activity. Eating habits. History of  falls. Memory and ability to understand (cognition). Work and work Statistician. Reproductive health. Screening  You may have the following tests or measurements: Height, weight, and BMI. Blood pressure. Lipid and cholesterol levels. These may be checked every 5 years, or more frequently if you are over 44 years old. Skin check. Lung cancer screening. You may have this screening every year starting at age 25 if you have a 30-pack-year history of smoking and currently smoke or have quit within the past 15 years. Fecal occult blood test (FOBT) of the stool. You may have this test every year starting at age 21. Flexible sigmoidoscopy or colonoscopy. You may have a sigmoidoscopy every 5 years or a colonoscopy every 10 years starting at age 22. Hepatitis C blood test. Hepatitis B blood test. Sexually transmitted disease (STD) testing. Diabetes screening. This is done by checking your blood sugar (glucose) after you have not eaten for a while (fasting). You may have this done every 1-3 years. Bone density scan. This is done to screen for osteoporosis. You may have this done starting at age 22. Mammogram. This may be done every 1-2 years. Talk to your health care provider about how often you should have regular mammograms. Talk with your health care provider about your test results, treatment options, and if necessary, the need for more tests. Vaccines  Your health care provider may recommend certain vaccines, such as: Influenza vaccine. This is recommended every year. Tetanus, diphtheria, and acellular pertussis (Tdap, Td) vaccine. You may need a Td booster every 10 years. Zoster vaccine. You may need this after age 40. Pneumococcal 13-valent conjugate (PCV13) vaccine. One dose is recommended after age 58. Pneumococcal polysaccharide (PPSV23) vaccine. One dose is recommended after age 107. Talk to your health care provider about which screenings  and vaccines you need and how often you need  them. This information is not intended to replace advice given to you by your health care provider. Make sure you discuss any questions you have with your health care provider. Document Released: 11/16/2015 Document Revised: 07/09/2016 Document Reviewed: 08/21/2015 Elsevier Interactive Patient Education  2017 [place] Prevention in the Home Falls can cause injuries. They can happen to people of all ages. There are many things you can do to make your home safe and to help prevent falls. What can I do on the outside of my home? Regularly fix the edges of walkways and driveways and fix any cracks. Remove anything that might make you trip as you walk through a door, such as a raised step or threshold. Trim any bushes or trees on the path to your home. Use bright outdoor lighting. Clear any walking paths of anything that might make someone trip, such as rocks or tools. Regularly check to see if handrails are loose or broken. Make sure that both sides of any steps have handrails. Any raised decks and porches should have guardrails on the edges. Have any leaves, snow, or ice cleared regularly. Use sand or salt on walking paths during winter. Clean up any spills in your garage right away. This includes oil or grease spills. What can I do in the bathroom? Use night lights. Install grab bars by the toilet and in the tub and shower. Do not use towel bars as grab bars. Use non-skid mats or decals in the tub or shower. If you need to sit down in the shower, use a plastic, non-slip stool. Keep the floor dry. Clean up any water that spills on the floor as soon as it happens. Remove soap buildup in the tub or shower regularly. Attach bath mats securely with double-sided non-slip rug tape. Do not have throw rugs and other things on the floor that can make you trip. What can I do in the bedroom? Use night lights. Make sure that you have a light by your bed that is easy to reach. Do not use  any sheets or blankets that are too big for your bed. They should not hang down onto the floor. Have a firm chair that has side arms. You can use this for support while you get dressed. Do not have throw rugs and other things on the floor that can make you trip. What can I do in the kitchen? Clean up any spills right away. Avoid walking on wet floors. Keep items that you use a lot in easy-to-reach places. If you need to reach something above you, use a strong step stool that has a grab bar. Keep electrical cords out of the way. Do not use floor polish or wax that makes floors slippery. If you must use wax, use non-skid floor wax. Do not have throw rugs and other things on the floor that can make you trip. What can I do with my stairs? Do not leave any items on the stairs. Make sure that there are handrails on both sides of the stairs and use them. Fix handrails that are broken or loose. Make sure that handrails are as long as the stairways. Check any carpeting to make sure that it is firmly attached to the stairs. Fix any carpet that is loose or worn. Avoid having throw rugs at the top or bottom of the stairs. If you do have throw rugs, attach them to the floor with carpet tape.  Make sure that you have a light switch at the top of the stairs and the bottom of the stairs. If you do not have them, ask someone to add them for you. What else can I do to help prevent falls? Wear shoes that: Do not have high heels. Have rubber bottoms. Are comfortable and fit you well. Are closed at the toe. Do not wear sandals. If you use a stepladder: Make sure that it is fully opened. Do not climb a closed stepladder. Make sure that both sides of the stepladder are locked into place. Ask someone to hold it for you, if possible. Clearly mark and make sure that you can see: Any grab bars or handrails. First and last steps. Where the edge of each step is. Use tools that help you move around (mobility aids)  if they are needed. These include: Canes. Walkers. Scooters. Crutches. Turn on the lights when you go into a dark area. Replace any light bulbs as soon as they burn out. Set up your furniture so you have a clear path. Avoid moving your furniture around. If any of your floors are uneven, fix them. If there are any pets around you, be aware of where they are. Review your medicines with your doctor. Some medicines can make you feel dizzy. This can increase your chance of falling. Ask your doctor what other things that you can do to help prevent falls. This information is not intended to replace advice given to you by your health care provider. Make sure you discuss any questions you have with your health care provider. Document Released: 08/16/2009 Document Revised: 03/27/2016 Document Reviewed: 11/24/2014 Elsevier Interactive Patient Education  2017 [employer].

## 2021-10-09 NOTE — Telephone Encounter (Signed)
Forms received and placed on provider desk.

## 2021-10-10 ENCOUNTER — Telehealth: Payer: Medicare PPO

## 2021-10-11 ENCOUNTER — Other Ambulatory Visit: Payer: Self-pay

## 2021-10-11 MED ORDER — LEVOCETIRIZINE DIHYDROCHLORIDE 5 MG PO TABS: 5.0000 mg | ORAL_TABLET | Freq: Every day | ORAL | 1 refills | Status: DC

## 2021-10-11 NOTE — Progress Notes (Signed)
HPI:FU atrial fibrillation and TIA. Brain MRI in January 2014 showed no acute infarct, mild small vessel disease. TEE 2/14 showed normal LV function, mild mitral valve prolapse with mild mitral regurgitation,  trace aortic insufficiency. There was no PFO. Monitor in March of 2014 showed paroxysmal atrial fibrillation/flutter. She was therefore started on anticoagulation. Carotid Dopplers 10/20 showed 1-39% bilateral stenosis; left subclavian stenosis noted.  Cardiac CTA October 2020 showed calcium score 7 but no obstructive coronary disease. Monitor June 2021 showed sinus rhythm with PACs and PAF.  Echocardiogram February 2022 showed normal LV function, mild aortic insufficiency. Since I last saw her she denies dyspnea, chest pain, palpitations or syncope.  No bleeding.  Current Outpatient Medications  Medication Sig Dispense Refill   ADVAIR DISKUS 100-50 MCG/DOSE AEPB Inhale 1 puff into the lungs in the morning and at bedtime. Maintain upcoming appt for additional refill 60 each 0   albuterol (VENTOLIN HFA) 108 (90 Base) MCG/ACT inhaler Inhale 1 puff into the lungs every 6 (six) hours as needed.     Alpha-D-Galactosidase (BEANO PO) Take 1 tablet by mouth 2 (two) times daily.     amLODipine (NORVASC) 5 MG tablet Take 1 tablet (5 mg total) by mouth daily. 90 tablet 2   atorvastatin (LIPITOR) 80 MG tablet Take 1 tablet (80 mg total) by mouth daily. 90 tablet 3   Azelastine-Fluticasone 137-50 MCG/ACT SUSP Place 1 spray into both nostrils daily.     bumetanide (BUMEX) 1 MG tablet Take 1 tablet (1 mg total) by mouth 2 (two) times daily. 180 tablet 1   Calcium Carbonate-Vitamin D 600-400 MG-UNIT tablet Take 1 tablet by mouth 2 (two) times daily.     Cholecalciferol (VITAMIN D-3) 5000 UNITS TABS Take 5,000 Units by mouth 3 (three) times a week. Tuesday Thursday Saturday     Cranberry 450 MG CAPS Take 2 capsules by mouth daily.     denosumab (PROLIA) 60 MG/ML SOLN injection Inject 60 mg into the skin  once. Administer in upper arm, thigh, or abdomen (Patient taking differently: Inject 60 mg into the skin every 6 (six) months. Administer in upper arm, thigh, or abdomen) 180 mL 0   esomeprazole (NEXIUM) 40 MG capsule Take 1 capsule (40 mg total) by mouth daily at 12 noon. 90 capsule 1   estradiol (ESTRACE) 0.1 MG/GM vaginal cream Place 1 Applicatorful vaginally once a week.     ezetimibe (ZETIA) 10 MG tablet Take 1 tablet (10 mg total) by mouth daily. 90 tablet 3   levocetirizine (XYZAL) 5 MG tablet Take 1 tablet (5 mg total) by mouth daily. 90 tablet 1   levothyroxine (SYNTHROID) 50 MCG tablet Take 1 tablet (50 mcg total) by mouth daily. 90 tablet 3   metoprolol succinate (TOPROL-XL) 50 MG 24 hr tablet Take 1 tablet (50 mg total) by mouth daily. Maintain upcoming appt for additional refill 90 tablet 2   mirabegron ER (MYRBETRIQ) 50 MG TB24 tablet Take 50 mg by mouth daily.     OVER THE COUNTER MEDICATION Take 2 tablets by mouth daily. Bounty  Hair,Nail, and Skin daily gummies.     potassium chloride (KLOR-CON) 10 MEQ tablet Take 1 tablet (10 mEq total) by mouth 2 (two) times daily. 180 tablet 1   rOPINIRole (REQUIP) 0.25 MG tablet Take 1 tablet (0.25 mg total) by mouth at bedtime. 90 tablet 3   traZODone (DESYREL) 50 MG tablet Take 1 tablet (50 mg total) by mouth at bedtime. 90 tablet 3  vitamin E 180 MG (400 UNITS) capsule Take 400 Units by mouth 3 (three) times a week. Tuesdays, Thursdays, and Saturdays     XARELTO 20 MG TABS tablet TAKE 1 TABLET(20 MG) BY MOUTH DAILY WITH SUPPER 90 tablet 1   naphazoline-pheniramine (EYE ALLERGY RELIEF) 0.025-0.3 % ophthalmic solution Place 1 drop into both eyes daily. (Patient not taking: Reported on 10/18/2021)     No current facility-administered medications for this visit.     Past Medical History:  Diagnosis Date   Allergy    Arthritis    hands   Asthma    uses an inhaler   Atrial fibrillation (HCC)    Atrial flutter (HCC)    Depression     Dysrhythmia    a-fib, on Xarelto   GERD (gastroesophageal reflux disease)    Hyperlipidemia    Hypertension    Hypothyroidism    Osteoporosis    Stroke ([place]) 2017   TIA   TIA (transient ischemic attack)     Past Surgical History:  Procedure Laterality Date   ABDOMINAL HYSTERECTOMY     COLONOSCOPY     MYRINGOTOMY WITH TUBE PLACEMENT Right 01/14/2016   Procedure: RIGHT MYRINGOTOMY WITH T-TUBE PLACEMENT;  Surgeon: Leta Baptist, MD;  Location: [facility];  Service: ENT;  Laterality: Right;   [place] OOPHERECTOMY Bilateral 02/14/2021   Procedure: XI ROBOTIC ASSISTED BILATERAL SALPINGO OOPHORECTOMY;  Surgeon: Everitt Amber, MD;  Location: [facility];  Service: Gynecology;  Laterality: Bilateral;   TEE WITHOUT CARDIOVERSION N/A 12/22/2012   Procedure: TRANSESOPHAGEAL ECHOCARDIOGRAM (TEE);  Surgeon: Lelon Perla, MD;  Location[facility];  Service: Cardiovascular;  Laterality: N/A;    Social History   Socioeconomic History   Marital status: Married    Spouse name: Not on file   Number of children: 2   Years of education: Not on file   Highest education level: Not on file  Occupational History    Employer: RETIRED  Tobacco Use   Smoking status: Never   Smokeless tobacco: Never  Vaping Use   Vaping Use: Never used  Substance and Sexual Activity   Alcohol use: Yes    Comment: Rare   Drug use: No   Sexual activity: Not Currently  Other Topics Concern   Not on file  Social History Narrative   Does have HPOA- sons Eddie Dibbles and Wille Glaser.   Does desire life support if not futile.   Not sure about feeding tubes.   Social Determinants of Health   Financial Resource Strain: Low Risk    Difficulty of Paying Living Expenses: Not hard at all  Food Insecurity: No Food Insecurity   Worried About Charity fundraiser in the Last Year: Never true   [place] in the Last Year: Never true  Transportation Needs: No  Transportation Needs   Lack of Transportation (Medical): No   Lack of Transportation (Non-Medical): No  Physical Activity: Inactive   Days of Exercise per Week: 0 days   Minutes of Exercise per Session: 0 min  Stress: No Stress Concern Present   Feeling of Stress : Not at all  Social Connections: Moderately Isolated   Frequency of Communication with Friends and Family: Twice a week   Frequency of Social Gatherings with Friends and Family: Twice a week   Attends Religious Services: Never   Marine scientist or Organizations: No   Attends Archivist Meetings: Never  Marital Status: Married  Human resources officer Violence: Not At Risk   Fear of Current or Ex-Partner: No   Emotionally Abused: No   Physically Abused: No   Sexually Abused: No    Family History  Problem Relation Age of Onset   Breast cancer Mother    Drug abuse Son    Depression Son    Stroke Paternal Aunt    Heart disease Paternal Grandfather    Diabetes Maternal Grandfather    Colon cancer Neg Hx    Ovarian cancer Neg Hx    Endometrial cancer Neg Hx    Pancreatic cancer Neg Hx    Prostate cancer Neg Hx     ROS: no fevers or chills, productive cough, hemoptysis, dysphasia, odynophagia, melena, hematochezia, dysuria, hematuria, rash, seizure activity, orthopnea, PND, pedal edema, claudication. Remaining systems are negative.  Physical Exam: Well-developed well-nourished in no acute distress.  Skin is warm and dry.  HEENT is normal.  Neck is supple.  Chest is clear to auscultation with normal expansion.  Cardiovascular exam is irregular Abdominal exam nontender or distended. No masses palpated. Extremities show no edema. neuro grossly intact  A/P  1 persistent atrial fibrillation-patient remains in atrial fibrillation on exam.  Her heart rate is controlled and she remains asymptomatic.  I think rate control and anticoagulation is indicated long-term.  Continue Toprol and Xarelto.  Check  hemoglobin and renal function.  2 hypertension-blood pressure controlled.  Continue present medical regimen and follow.  3 hyperlipidemia-continue statin.  Recent total cholesterol 107 with LDL 44.  Check liver functions.  4 subclavian stenosis-continue medical therapy.  Patient is asymptomatic.  5 carotid artery disease-mild on most recent Dopplers.  6 history of mild aortic insufficiency-we will plan follow-up echoes in the future.  7 history of mitral valve prolapse-not evident on most recent echocardiogram.  Kirk Ruths, MD

## 2021-10-14 ENCOUNTER — Telehealth: Payer: Self-pay | Admitting: Nurse Practitioner

## 2021-10-14 NOTE — Telephone Encounter (Signed)
What is the name of the medication? esomeprazole (Enola) 20 MG capsule [[id number]]   Have you contacted your pharmacy to request a refill? She is wanting a 40mg  tablet. She is also wanting the name of a good ear, nose and throat doctor.  Which pharmacy would you like this sent to? [id number] [address], [address] AT [facility] OF [address]  [address] [place] [address]  Phone:  [phone]  Fax:  [phone]  DEA #:  [id number]   Patient notified that their request is being sent to the clinical staff for review and that they should receive a call once it is complete. If they do not receive a call within 72 hours they can check with their pharmacy or our office.

## 2021-10-16 NOTE — Telephone Encounter (Signed)
Pt states she has always been on a 40 mg dose until you prescribed the 20 mg

## 2021-10-16 NOTE — Telephone Encounter (Signed)
Pt states she needs 40 mg instead of 20 mg and would like to have it updated if possible.

## 2021-10-17 ENCOUNTER — Ambulatory Visit (INDEPENDENT_AMBULATORY_CARE_PROVIDER_SITE_OTHER): Payer: Medicare PPO

## 2021-10-17 ENCOUNTER — Telehealth: Payer: Self-pay | Admitting: Nurse Practitioner

## 2021-10-17 DIAGNOSIS — M810 Age-related osteoporosis without current pathological fracture: Secondary | ICD-10-CM

## 2021-10-17 DIAGNOSIS — I480 Paroxysmal atrial fibrillation: Secondary | ICD-10-CM

## 2021-10-17 DIAGNOSIS — K219 Gastro-esophageal reflux disease without esophagitis: Secondary | ICD-10-CM

## 2021-10-17 DIAGNOSIS — F99 Mental disorder, not otherwise specified: Secondary | ICD-10-CM

## 2021-10-17 DIAGNOSIS — E038 Other specified hypothyroidism: Secondary | ICD-10-CM

## 2021-10-17 DIAGNOSIS — F32A Depression, unspecified: Secondary | ICD-10-CM

## 2021-10-17 DIAGNOSIS — I10 Essential (primary) hypertension: Secondary | ICD-10-CM

## 2021-10-17 DIAGNOSIS — Z8673 Personal history of transient ischemic attack (TIA), and cerebral infarction without residual deficits: Secondary | ICD-10-CM

## 2021-10-17 DIAGNOSIS — J4520 Mild intermittent asthma, uncomplicated: Secondary | ICD-10-CM

## 2021-10-17 MED ORDER — ESOMEPRAZOLE MAGNESIUM 40 MG PO CPDR: 40.0000 mg | DELAYED_RELEASE_CAPSULE | Freq: Every day | ORAL | 1 refills | Status: DC

## 2021-10-17 NOTE — Progress Notes (Signed)
Chronic Care Management Pharmacy Note  10/31/2021 Name:  Patricia Peck MRN:  [id number] DOB:  05/11/1946  Summary: Patient presents for initial CCM consult. She is wanting to resume esomeprazole 40 mg daily as it was previously prescribed for her this way. She has started Allegra in addition to [place].   Recommendations/Changes made from today's visit: Stop Allegra Stop Famitidine  Resume Esomeprazole 40 mg daily  Switch from calcium carbonate to calcium citrate while taking Nexium.   Plan: CPP follow-up 6 months  Subjective: Patricia Peck is an 75 y.o. year old female who is a primary patient of Nche, Charlene Brooke, NP.  The CCM team was consulted for assistance with disease management and care coordination needs.    Engaged with patient by telephone for initial visit in response to provider referral for pharmacy case management and/or care coordination services.   Consent to Services:  The patient was given the following information about Chronic Care Management services today, agreed to services, and gave verbal consent: 1. CCM service includes personalized support from designated clinical staff supervised by the primary care provider, including individualized plan of care and coordination with other care providers 2. 24/7 contact phone numbers for assistance for urgent and routine care needs. 3. Service will only be billed when office clinical staff spend 20 minutes or more in a month to coordinate care. 4. Only one practitioner may furnish and bill the service in a calendar month. 5.The patient may stop CCM services at any time (effective at the end of the month) by phone call to the office staff. 6. The patient will be responsible for cost sharing (co-pay) of up to 20% of the service fee (after annual deductible is met). Patient agreed to services and consent obtained.  Patient Care Team: Nche, Charlene Brooke, NP as PCP - General (Internal Medicine) Stanford Breed Denice Bors, MD as PCP -  Cardiology (Cardiology) Stanford Breed Denice Bors, MD as Consulting Physician (Cardiology) Leta Baptist, MD as Consulting Physician (Otolaryngology) Mosetta Anis, MD as Referring Physician (Allergy) Germaine Pomfret, [facility] as Pharmacist (Pharmacist)  Recent office visits: 07/29/2021 Wilfred Lacy NP (PCP) start Nexium 20 mg daily 07/16/2021 Wilfred Lacy NP (PCP) Stop Zolpidem,  return in 6 months 05/28/2021 Asisa Orene Desanctis CMA (PCP office) Prolia injection given  Recent consult visits: 07/05/2021 Dr. Georgina Snell MD (Sports Medicine) No Medication Changes noted 04/23/2021 Dr. Stanford Breed MD (Cardiology) No medication changes noted, return in 6 months  Hospital visits: None in previous 6 months   Objective:  Lab Results  Component Value Date   CREATININE 0.93 10/18/2021   BUN 13 10/18/2021   GFR 69.64 07/19/2021   GFRNONAA >60 02/12/2021   GFRAA 59 (L) 03/26/2020   NA 141 10/18/2021   K 3.9 10/18/2021   CALCIUM 9.4 10/18/2021   CO2 24 10/18/2021   GLUCOSE 89 10/18/2021    Lab Results  Component Value Date/Time   HGBA1C 5.7 09/07/2017 04:10 PM   GFR 69.64 07/19/2021 11:08 AM   GFR 56.13 (L) 07/06/2019 09:54 AM    Last diabetic Eye exam: No results found for: HMDIABEYEEXA  Last diabetic Foot exam: No results found for: HMDIABFOOTEX   Lab Results  Component Value Date   CHOL 107 07/19/2021   HDL 42.00 07/19/2021   LDLCALC 44 07/19/2021   LDLDIRECT 129.2 05/28/2007   TRIG 103.0 07/19/2021   CHOLHDL 3 07/19/2021    Hepatic Function Latest Ref Rng & Units 10/18/2021 01/01/2021 10/15/2020  Total Protein 6.0 - 8.5 g/dL 6.7 6.4  7.2  Albumin 3.7 - 4.7 g/dL 4.8(H) 4.2 4.3  AST 0 - 40 IU/L _0 ALT 0 - 32 IU/L _1 Alk Phosphatase 44 - 121 IU/L 80 69 119  Total Bilirubin 0.0 - 1.2 mg/dL 0.6 0.5 0.4  Bilirubin, Direct 0.00 - 0.40 mg/dL - 0.15 0.12    Lab Results  Component Value Date/Time   TSH 0.99 07/19/2021 11:08 AM   TSH 2.44 07/20/2020 09:47 AM   FREET4 0.88 07/19/2021  11:08 AM   FREET4 0.88 07/20/2020 09:47 AM    CBC Latest Ref Rng & Units 10/18/2021 02/12/2021 01/01/2021  WBC 3.4 - 10.8 x10E3/uL 7.8 9.1 7.8  Hemoglobin 11.1 - 15.9 g/dL 13.2 13.3 11.8  Hematocrit 34.0 - 46.6 % 40.4 40.7 36.1  Platelets 150 - 450 x10E3/uL 328 350 372    Lab Results  Component Value Date/Time   VD25OH 69.63 07/06/2019 09:54 AM   VD25OH 64.58 04/17/2016 09:15 AM    Clinical ASCVD: No  The ASCVD Risk score (Arnett DK, et al., 2019) failed to calculate for the following reasons:   The valid total cholesterol range is 130 to 320 mg/dL    Depression screen [facility] 2/9 10/09/2021 10/09/2021 07/16/2021  Decreased Interest 0 0 0  Down, Depressed, Hopeless 0 0 0  PHQ - 2 Score 0 0 0  Altered sleeping - - -  Tired, decreased energy - - -  Change in appetite - - -  Feeling bad or failure about yourself  - - -  Trouble concentrating - - -  Moving slowly or fidgety/restless - - -  Suicidal thoughts - - -  PHQ-9 Score - - -  Difficult doing work/chores - - -  Some recent data might be hidden    -Last DEXA Scan: 04/04/2020    T-Score femoral neck: -1.7  T-Score lumbar spine: -0.2  Social History   Tobacco Use  Smoking Status Never  Smokeless Tobacco Never   BP Readings from Last 3 Encounters:  10/18/21 110/68  07/16/21 100/82  07/05/21 124/80   Pulse Readings from Last 3 Encounters:  10/18/21 69  07/16/21 66  07/05/21 67   Wt Readings from Last 3 Encounters:  10/18/21 124 lb (56.2 kg)  07/16/21 124 lb (56.2 kg)  07/05/21 124 lb 9.6 oz (56.5 kg)   BMI Readings from Last 3 Encounters:  10/18/21 21.28 kg/m  07/16/21 21.28 kg/m  07/05/21 21.39 kg/m    Assessment/Interventions: Review of patient past medical history, allergies, medications, health status, including review of consultants reports, laboratory and other test data, was performed as part of comprehensive evaluation and provision of chronic care management services.   SDOH:  (Social Determinants of  Health) assessments and interventions performed: Yes SDOH Interventions    Flowsheet Row Most Recent Value  SDOH Interventions   Financial Strain Interventions Intervention Not Indicated      SDOH Screenings   Alcohol Screen: Low Risk    Last Alcohol Screening Score (AUDIT): 1  Depression (PHQ2-9): Low Risk    PHQ-2 Score: 0  Financial Resource Strain: Low Risk    Difficulty of Paying Living Expenses: Not hard at all  Food Insecurity: No Food Insecurity   Worried About Charity fundraiser in the Last Year: Never true   Ran Out of Food in the Last Year: Never true  Housing: Low Risk    Last Housing Risk Score: 0  Physical Activity: Inactive   Days of Exercise per Week: 0  days   Minutes of Exercise per Session: 0 min  Social Connections: Moderately Isolated   Frequency of Communication with Friends and Family: Twice a week   Frequency of Social Gatherings with Friends and Family: Twice a week   Attends Religious Services: Never   Marine scientist or Organizations: No   Attends Music therapist: Never   Marital Status: Married  Stress: No Stress Concern Present   Feeling of Stress : Not at all  Tobacco Use: Low Risk    Smoking Tobacco Use: Never   Smokeless Tobacco Use: Never   Passive Exposure: Not on file  Transportation Needs: No Transportation Needs   Lack of Transportation (Medical): No   Lack of Transportation (Non-Medical): No    CCM Care Plan  No Known Allergies  Medications Reviewed Today     Reviewed by Lelon Perla, MD (Physician) on 10/18/21 at [place] List Status: <None>   Medication Order Taking? Sig Documenting Provider Last Dose Status Informant  ADVAIR DISKUS 100-50 MCG/DOSE AEPB [id number] Yes Inhale 1 puff into the lungs in the morning and at bedtime. Maintain upcoming appt for additional refill Nche, Charlene Brooke, NP Taking Active Self  albuterol (VENTOLIN HFA) 108 (90 Base) MCG/ACT inhaler [id number] Yes Inhale 1 puff  into the lungs every 6 (six) hours as needed. [provider] Taking Active   Alpha-D-Galactosidase (BEANO PO) [id number] Yes Take 1 tablet by mouth 2 (two) times daily. [provider] Taking Active   amLODipine (NORVASC) 5 MG tablet [id number] Yes Take 1 tablet (5 mg total) by mouth daily. Lelon Perla, MD Taking Active   atorvastatin (LIPITOR) 80 MG tablet [id number] Yes Take 1 tablet (80 mg total) by mouth daily. Lelon Perla, MD Taking Active   Azelastine-Fluticasone 137-50 MCG/ACT SUSP [id number] Yes Place 1 spray into both nostrils daily. [provider] Taking Active   bumetanide (BUMEX) 1 MG tablet [id number] Yes Take 1 tablet (1 mg total) by mouth 2 (two) times daily. Flossie Buffy, NP Taking Active   Calcium Carbonate-Vitamin D 600-400 MG-UNIT tablet [id number] Yes Take 1 tablet by mouth 2 (two) times daily. [provider] Taking Active Self  Cholecalciferol (VITAMIN D-3) 5000 UNITS TABS [id number] Yes Take 5,000 Units by mouth 3 (three) times a week. Tuesday Thursday Saturday [provider] Taking Active Self  Cranberry 450 MG CAPS [id number] Yes Take 2 capsules by mouth daily. [provider] Taking Active   denosumab (PROLIA) 60 MG/ML SOLN injection [id number] Yes Inject 60 mg into the skin once. Administer in upper arm, thigh, or abdomen  Patient taking differently: Inject 60 mg into the skin every 6 (six) months. Administer in upper arm, thigh, or abdomen   Lucille Passy, MD Taking Active   esomeprazole (NEXIUM) 40 MG capsule [id number] Yes Take 1 capsule (40 mg total) by mouth daily at 12 noon. Nche, Charlene Brooke, NP Taking Active   estradiol (ESTRACE) 0.1 MG/GM vaginal cream [id number] Yes Place 1 Applicatorful vaginally once a week. [provider] Taking Active Self  ezetimibe (ZETIA) 10 MG tablet [id number] Yes Take 1 tablet (10 mg total) by mouth daily. Lelon Perla, MD Taking Active    Patient not taking:    Discontinued 02/07/21 1305 (Reorder)   levocetirizine (XYZAL) 5 MG tablet [id number] Yes Take 1 tablet (5 mg total) by mouth daily. Flossie Buffy, NP Taking Active   levothyroxine (SYNTHROID) 50 MCG tablet [id number] Yes Take 1 tablet (50 mcg  total) by mouth daily. Nche, Charlene Brooke, NP Taking Active   metoprolol succinate (TOPROL-XL) 50 MG 24 hr tablet [id number] Yes Take 1 tablet (50 mg total) by mouth daily. Maintain upcoming appt for additional refill Stanford Breed Denice Bors, MD Taking Active   mirabegron ER (MYRBETRIQ) 50 MG TB24 tablet [id number] Yes Take 50 mg by mouth daily. [provider] Taking Active   naphazoline-pheniramine (EYE ALLERGY RELIEF) 0.025-0.3 % ophthalmic solution [id number] No Place 1 drop into both eyes daily.  Patient not taking: Reported on 10/18/2021   [provider] Not Taking Active   OVER THE COUNTER MEDICATION [id number] Yes Take 2 tablets by mouth daily. Bounty  Hair,Nail, and Skin daily gummies. [provider] Taking Active Self  potassium chloride (KLOR-CON) 10 MEQ tablet [id number] Yes Take 1 tablet (10 mEq total) by mouth 2 (two) times daily. Nche, Charlene Brooke, NP Taking Active   rOPINIRole (REQUIP) 0.25 MG tablet [id number] Yes Take 1 tablet (0.25 mg total) by mouth at bedtime. Ronnald Nian, DO Taking Active   traZODone (DESYREL) 50 MG tablet [id number] Yes Take 1 tablet (50 mg total) by mouth at bedtime. Flossie Buffy, NP Taking Active   vitamin E 180 MG (400 UNITS) capsule [id number] Yes Take 400 Units by mouth 3 (three) times a week. 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[place]) 09/14/2019   History of TIA (transient ischemic attack) 08/01/2019   Anticoagulated 08/01/2019   Situational anxiety 05/23/2019   IBS (irritable bowel syndrome) 04/11/2013   PAF (paroxysmal atrial fibrillation) (B and E) 01/27/2013   Cerebrovascular disease 01/27/2013   Insomnia 11/02/2012   GERD 03/12/2010   Hypothyroidism 04/14/2007   Depression 04/14/2007   Essential hypertension 04/14/2007   Osteoporosis 04/14/2007    Immunization History  Administered Date(s) Administered   Fluad Quad(high Dose 65+) 06/30/2019   Influenza Split 09/04/2011, 08/11/2012, 08/24/2013, 07/20/2015, 07/29/2016, 08/04/2017, 07/10/2018   Influenza Whole 08/07/2009   Influenza, High Dose Seasonal PF 09/26/2013, 08/28/2014, 11/27/2016, 12/10/2017, 11/18/2018, 06/30/2019, 11/15/2019, 11/13/2020   Influenza,inj,Quad PF,6+ Mos 08/24/2013, 07/20/2015, 07/29/2016, 08/04/2017   Influenza-Unspecified 07/10/2018, 07/14/2020   PFIZER(Purple Top)SARS-COV-2 Vaccination 11/23/2019, 02/02/2020, 04/05/2020   Pneumococcal Conjugate-13 04/12/2014   Pneumococcal Polysaccharide-23 02/05/2006, 02/05/2006, 04/08/2012, 04/08/2012, 04/22/2016, 11/27/2016, 12/10/2017, 11/18/2018, 11/15/2019, 04/05/2020, 11/13/2020   Td 01/29/2005, 04/17/2015   Zoster Recombinat (Shingrix) 02/09/2018, 05/11/2020   Zoster, Live 04/16/2007    Conditions to be addressed/monitored:  Hypertension, Hyperlipidemia, Atrial Fibrillation, GERD, Asthma, Hypothyroidism, Depression, Anxiety, Osteoporosis, and History of TIA, Insomnia  Care Plan : General Pharmacy (Adult)  Updates made by Germaine Pomfret, RPH since 10/31/2021 12:00 AM     Problem: Hypertension, Hyperlipidemia, Atrial Fibrillation, GERD, Asthma, Hypothyroidism, Depression, Anxiety, Osteoporosis, and History of TIA, Insomnia   Priority: High     Long-Range Goal: Patient-Specific Goal   Start  Date: 10/31/2021  Expected End Date: 10/31/2022  This Visit's Progress: On track  Priority: High  Note:   Current Barriers:  No barriers noted  Pharmacist Clinical Goal(s):  Patient will maintain control of blood  pressure as evidenced by BP less than 140/90  through collaboration with PharmD and provider.   Interventions: 1:1 collaboration with Nche, Charlene Brooke, NP regarding development and update of comprehensive plan of care as evidenced by provider attestation and co-signature Inter-disciplinary care team collaboration (see longitudinal plan of care) Comprehensive medication review performed; medication list updated in electronic medical record  Hypertension (BP goal <140/90) -Controlled -Current treatment: Amlodipine 5 mg daily  Bumetanide 1 mg twice daily  Metoprolol Xl 50 mg daily  -Medications previously tried: NA  -Current home readings: Does not monitor regularly  -Denies hypotensive/hypertensive symptoms -Recommended to continue current medication  Atrial Fibrillation (Goal: prevent stroke and major bleeding) -Controlled -CHADSVASC: 7 -Current treatment: Rate control: Metoprolol XL 50 mg twice daily Anticoagulation: Xarelto 20 mg nightly -Medications previously tried: NA -Recommended to continue current medication  Hyperlipidemia: (LDL goal < 70) -Controlled -History of TIA, History of CVA.  -Current treatment: Atorvastatin 80 mg daily  Ezetimibe 10 mg daily  -Medications previously tried: NA  -Recommended to continue current medication  [place] (Goal: control symptoms and prevent exacerbations) -Controlled -Current treatment  Advair Diskus 1 puff twice daily  Ventolin 1 puff every 6 hours as needed  -Current allergy treatment  Dymista 137-50 mg 1 spray each nostril daily   Levocetirizine 5 mg daily  Allegra 24 hr 180 mg daily  -Medications previously tried: Flonase  -Some coughing, sneezing symptoms. Started allegra to manage symptoms -Previously  receiving allergy shots.  -Exacerbations requiring treatment in last 6 months: None -Patient reports consistent use of maintenance inhaler -Frequency of rescue inhaler use: <1x monthly  -STOP Allegra to risk of CNS depression with combined use with Levocetirizine.   Depression/Anxiety (Goal: Maintain symptom remission) -Controlled -Current treatment: None -Medications previously tried/failed: Alprazolam, Wellbutrin,  -PHQ9: 0 -GAD7: 0 -Recommended to continue current medication  Insomnia (Goal: Improve sleep quality) -Controlled -Current treatment  Trazodone 50 mg nightly  -Medications previously tried: Suvorexant, zolpidem  -Previously had been on 150 mg nightly trazodone  -Currently 5-6 hours of sleep nightly. Feels well rested.  -Recommended to continue current medication  Osteoporosis (Goal Prevent fractures) -Controlled -Current treatment  Prolia 60 mg every 6 months (due 11/28/21)  Calcium carbonate +d3 500-400 mg twice daily  Vitamin D3 5000 units daily on Tues/Thurs/Sat -Medications previously tried: NA  -1 serving of dietary calcium  -Recommend 1200 mg of calcium daily from dietary and supplemental sources. -Switch from calcium carbonate to calcium citrate while taking Nexium.  -Recommended to continue current medication  Hypothyroidism (Goal: Maintain symptom remission) -Controlled -Current treatment  Levothyroxine 50 mcg daily  -Medications previously tried: NA  -Recommended to continue current medication  GERD (Goal: Minimize heartburn symptoms) -Controlled -Current treatment  Esomeprazole 20 mg daily (40 mg daily)  Famotidine 10 mg daily  -Medications previously tried: NA -Patient taking famotidine while on Nexium 20 mg, still had breakthrough symptoms. She then decided to resume esomeprazole 40 mg daily. -INCREASE esomeprazole to 40 mg daily as previously taking   -STOP famotidine  Patient Goals/Self-Care Activities Patient will:  - check blood  pressure weekly, document, and provide at future appointments  Follow Up Plan: Telephone follow up appointment with care management team member scheduled for:  04/10/2022 at 11:00 AM      Medication Assistance: None required.  Patient affirms current coverage meets needs.  Compliance/Adherence/Medication fill history: Care Gaps: Influenza Vaccine  Star-Rating Drugs: Atorvastatin 80 mg last filled 01/03/2021 90 day supply at [facility].  Patient's preferred pharmacy is:  [id number] [address], [address] AT [facility] OF [address] & [address] [place] [address] Phone: [phone] Fax: [phone]  Uses pill box? Yes Pt endorses 100% compliance  We discussed: Current pharmacy is preferred with insurance plan and patient is satisfied with pharmacy services Patient decided to: Continue current medication management strategy  Care Plan and Follow Up Patient Decision:  Patient agrees to Care Plan and Follow-up.  Plan: Telephone follow up appointment with care management team member scheduled for:  04/10/2022 at 11:00 AM  Junius Argyle, PharmD, Para [place], CPP Clinical Pharmacist Practitioner  [facility] at [facility]  [phone]

## 2021-10-18 ENCOUNTER — Ambulatory Visit (INDEPENDENT_AMBULATORY_CARE_PROVIDER_SITE_OTHER): Payer: Medicare PPO | Admitting: Cardiology

## 2021-10-18 ENCOUNTER — Other Ambulatory Visit: Payer: Self-pay

## 2021-10-18 ENCOUNTER — Encounter: Payer: Self-pay | Admitting: Cardiology

## 2021-10-18 VITALS — BP 110/68 | HR 69 | Ht 64.0 in | Wt 124.0 lb

## 2021-10-18 DIAGNOSIS — E7800 Pure hypercholesterolemia, unspecified: Secondary | ICD-10-CM | POA: Diagnosis not present

## 2021-10-18 DIAGNOSIS — I10 Essential (primary) hypertension: Secondary | ICD-10-CM

## 2021-10-18 DIAGNOSIS — I059 Rheumatic mitral valve disease, unspecified: Secondary | ICD-10-CM | POA: Diagnosis not present

## 2021-10-18 DIAGNOSIS — I480 Paroxysmal atrial fibrillation: Secondary | ICD-10-CM

## 2021-10-18 LAB — CBC
Hematocrit: 40.4 % (ref 34.0–46.6)
Hemoglobin: 13.2 g/dL (ref 11.1–15.9)
MCH: 29.3 pg (ref 26.6–33.0)
MCHC: 32.7 g/dL (ref 31.5–35.7)
MCV: 90 fL (ref 79–97)
Platelets: 328 10*3/uL (ref 150–450)
RBC: 4.51 x10E6/uL (ref 3.77–5.28)
RDW: 12.7 % (ref 11.7–15.4)
WBC: 7.8 10*3/uL (ref 3.4–10.8)

## 2021-10-18 LAB — COMPREHENSIVE METABOLIC PANEL
ALT: 17 IU/L (ref 0–32)
AST: 18 IU/L (ref 0–40)
Albumin/Globulin Ratio: 2.5 — ABNORMAL HIGH (ref 1.2–2.2)
Albumin: 4.8 g/dL — ABNORMAL HIGH (ref 3.7–4.7)
Alkaline Phosphatase: 80 IU/L (ref 44–121)
BUN/Creatinine Ratio: 14 (ref 12–28)
BUN: 13 mg/dL (ref 8–27)
Bilirubin Total: 0.6 mg/dL (ref 0.0–1.2)
CO2: 24 mmol/L (ref 20–29)
Calcium: 9.4 mg/dL (ref 8.7–10.3)
Chloride: 102 mmol/L (ref 96–106)
Creatinine, Ser: 0.93 mg/dL (ref 0.57–1.00)
Globulin, Total: 1.9 g/dL (ref 1.5–4.5)
Glucose: 89 mg/dL (ref 70–99)
Potassium: 3.9 mmol/L (ref 3.5–5.2)
Sodium: 141 mmol/L (ref 134–144)
Total Protein: 6.7 g/dL (ref 6.0–8.5)
eGFR: 64 mL/min/{1.73_m2} (ref >59)

## 2021-10-18 NOTE — Patient Instructions (Signed)

## 2021-10-23 ENCOUNTER — Telehealth: Payer: Self-pay | Admitting: Nurse Practitioner

## 2021-10-23 NOTE — Telephone Encounter (Signed)
Spoke with patient and informed her medication was sent on 10/17/21. Pt states she needs PA per her insurance. Informed patient I would look into cover my meds and see if I can complete this for her. Pt verbalized understanding.

## 2021-10-29 ENCOUNTER — Telehealth: Payer: Self-pay | Admitting: Nurse Practitioner

## 2021-10-29 NOTE — Telephone Encounter (Signed)
What is the name of the medication? esomeprazole ([place]) 40 MG capsule [[id number]]   Have you contacted your pharmacy to request a refill? Pt is saying the pharmacy does not have her script. Please fax over request, they can use her [facility] insurance card, so no PA needed.   Which pharmacy would you like this sent to? [id number] [address], [address] AT [facility] OF [address]  [address] [place] [address]  Phone:  [phone]  Fax:  [phone]  DEA #:  [id number]   Patient notified that their request is being sent to the clinical staff for review and that they should receive a call once it is complete. If they do not receive a call within 72 hours they can check with their pharmacy or our office.

## 2021-10-31 NOTE — Telephone Encounter (Signed)
Rx ready at pharmacy Pt notified.

## 2021-10-31 NOTE — Patient Instructions (Signed)
Visit Information It was great speaking with you today!  Please let me know if you have any questions about our visit.   Goals Addressed             This Visit's Progress    Track and Manage My Blood Pressure-Hypertension       Timeframe:  Long-Range Goal Priority:  High Start Date: 10/17/2021                            Expected End Date:   10/17/2022                    Follow Up within 90 days   - check blood pressure weekly    Why is this important?   You won't feel high blood pressure, but it can still hurt your blood vessels.  High blood pressure can cause heart or kidney problems. It can also cause a stroke.  Making lifestyle changes like losing a little weight or eating less salt will help.  Checking your blood pressure at home and at different times of the day can help to control blood pressure.  If the doctor prescribes medicine remember to take it the way the doctor ordered.  Call the office if you cannot afford the medicine or if there are questions about it.     Notes:         Patient Care Plan: General Pharmacy (Adult)     Problem Identified: Hypertension, Hyperlipidemia, Atrial Fibrillation, GERD, Asthma, Hypothyroidism, Depression, Anxiety, Osteoporosis, and History of TIA, Insomnia   Priority: High     Long-Range Goal: Patient-Specific Goal   Start Date: 10/31/2021  Expected End Date: 10/31/2022  This Visit's Progress: On track  Priority: High  Note:   Current Barriers:  No barriers noted  Pharmacist Clinical Goal(s):  Patient will maintain control of blood pressure as evidenced by BP less than 140/90  through collaboration with PharmD and provider.   Interventions: 1:1 collaboration with Nche, Charlene Brooke, NP regarding development and update of comprehensive plan of care as evidenced by provider attestation and co-signature Inter-disciplinary care team collaboration (see longitudinal plan of care) Comprehensive medication review performed;  medication list updated in electronic medical record  Hypertension (BP goal <140/90) -Controlled -Current treatment: Amlodipine 5 mg daily  Bumetanide 1 mg twice daily  Metoprolol Xl 50 mg daily  -Medications previously tried: NA  -Current home readings: Does not monitor regularly  -Denies hypotensive/hypertensive symptoms -Recommended to continue current medication  Atrial Fibrillation (Goal: prevent stroke and major bleeding) -Controlled -CHADSVASC: 7 -Current treatment: Rate control: Metoprolol XL 50 mg twice daily Anticoagulation: Xarelto 20 mg nightly -Medications previously tried: NA -Recommended to continue current medication  Hyperlipidemia: (LDL goal < 70) -Controlled -History of TIA, History of CVA.  -Current treatment: Atorvastatin 80 mg daily  Ezetimibe 10 mg daily  -Medications previously tried: NA  -Recommended to continue current medication  Athma (Goal: control symptoms and prevent exacerbations) -Controlled -Current treatment  Advair Diskus 1 puff twice daily  Ventolin 1 puff every 6 hours as needed  -Current allergy treatment  Dymista 137-50 mg 1 spray each nostril daily   Levocetirizine 5 mg daily  Allegra 24 hr 180 mg daily  -Medications previously tried: Flonase  -Some coughing, sneezing symptoms. Started allegra to manage symptoms -Previously receiving allergy shots.  -Exacerbations requiring treatment in last 6 months: None -Patient reports consistent use of maintenance inhaler -Frequency of rescue inhaler  use: <1x monthly  -STOP Allegra to risk of CNS depression with combined use with Levocetirizine.   Depression/Anxiety (Goal: Maintain symptom remission) -Controlled -Current treatment: None -Medications previously tried/failed: Alprazolam, Wellbutrin,  -PHQ9: 0 -GAD7: 0 -Recommended to continue current medication  Insomnia (Goal: Improve sleep quality) -Controlled -Current treatment  Trazodone 50 mg nightly  -Medications previously  tried: Suvorexant, zolpidem  -Previously had been on 150 mg nightly trazodone  -Currently 5-6 hours of sleep nightly. Feels well rested.  -Recommended to continue current medication  Osteoporosis (Goal Prevent fractures) -Controlled -Current treatment  Prolia 60 mg every 6 months (due 11/28/21)  Calcium carbonate +d3 500-400 mg twice daily  Vitamin D3 5000 units daily on Tues/Thurs/Sat -Medications previously tried: NA  -1 serving of dietary calcium  -Recommend 1200 mg of calcium daily from dietary and supplemental sources. -Switch from calcium carbonate to calcium citrate while taking Nexium.  -Recommended to continue current medication  Hypothyroidism (Goal: Maintain symptom remission) -Controlled -Current treatment  Levothyroxine 50 mcg daily  -Medications previously tried: NA  -Recommended to continue current medication  GERD (Goal: Minimize heartburn symptoms) -Controlled -Current treatment  Esomeprazole 20 mg daily (40 mg daily)  Famotidine 10 mg daily  -Medications previously tried: NA -Patient taking famotidine while on Nexium 20 mg, still had breakthrough symptoms. She then decided to resume esomeprazole 40 mg daily. -INCREASE esomeprazole to 40 mg daily as previously taking   -STOP famotidine  Patient Goals/Self-Care Activities Patient will:  - check blood pressure weekly, document, and provide at future appointments  Follow Up Plan: Telephone follow up appointment with care management team member scheduled for:  04/10/2022 at 11:00 AM    Ms. Holwerda was given information about Chronic Care Management services today including:  CCM service includes personalized support from designated clinical staff supervised by her physician, including individualized plan of care and coordination with other care providers 24/7 contact phone numbers for assistance for urgent and routine care needs. Standard insurance, coinsurance, copays and deductibles apply for chronic care  management only during months in which we provide at least 20 minutes of these services. Most insurances cover these services at 100%, however patients may be responsible for any copay, coinsurance and/or deductible if applicable. This service may help you avoid the need for more expensive face-to-face services. Only one practitioner may furnish and bill the service in a calendar month. The patient may stop CCM services at any time (effective at the end of the month) by phone call to the office staff.  Patient agreed to services and verbal consent obtained.   Patient verbalizes understanding of instructions provided today and agrees to view in [place].   Junius Argyle, PharmD, Para March, CPP Clinical Pharmacist Practitioner  [facility]  [phone]

## 2021-11-02 DIAGNOSIS — E785 Hyperlipidemia, unspecified: Secondary | ICD-10-CM

## 2021-11-02 DIAGNOSIS — J4520 Mild intermittent asthma, uncomplicated: Secondary | ICD-10-CM

## 2021-11-02 DIAGNOSIS — I10 Essential (primary) hypertension: Secondary | ICD-10-CM

## 2021-11-02 DIAGNOSIS — I480 Paroxysmal atrial fibrillation: Secondary | ICD-10-CM

## 2021-11-02 DIAGNOSIS — F32A Depression, unspecified: Secondary | ICD-10-CM

## 2021-11-02 DIAGNOSIS — M810 Age-related osteoporosis without current pathological fracture: Secondary | ICD-10-CM

## 2021-11-14 DIAGNOSIS — J4530 Mild persistent asthma, uncomplicated: Secondary | ICD-10-CM | POA: Diagnosis not present

## 2021-11-14 DIAGNOSIS — H6990 Unspecified Eustachian tube disorder, unspecified ear: Secondary | ICD-10-CM | POA: Diagnosis not present

## 2021-11-14 DIAGNOSIS — J300 Vasomotor rhinitis: Secondary | ICD-10-CM | POA: Diagnosis not present

## 2021-11-14 DIAGNOSIS — H1045 Other chronic allergic conjunctivitis: Secondary | ICD-10-CM | POA: Diagnosis not present

## 2021-11-19 ENCOUNTER — Other Ambulatory Visit: Payer: Self-pay

## 2021-11-19 DIAGNOSIS — G2581 Restless legs syndrome: Secondary | ICD-10-CM

## 2021-11-19 MED ORDER — ROPINIROLE HCL 0.25 MG PO TABS: 0.2500 mg | ORAL_TABLET | Freq: Every day | ORAL | 3 refills | Status: DC

## 2021-11-19 NOTE — Telephone Encounter (Signed)
Rx refill request received from pharmacy via fax, chart supports Rx. Last seen 07/16/2021  Next OV 04/10/2022

## 2021-11-27 ENCOUNTER — Ambulatory Visit: Payer: Medicare PPO

## 2021-11-28 ENCOUNTER — Ambulatory Visit: Payer: Medicare PPO

## 2021-12-04 ENCOUNTER — Ambulatory Visit: Payer: Medicare PPO

## 2021-12-05 ENCOUNTER — Telehealth: Payer: Self-pay

## 2021-12-05 ENCOUNTER — Ambulatory Visit: Payer: Medicare PPO

## 2021-12-05 NOTE — Telephone Encounter (Signed)
Pt called back and I made her aware, she is canceling today and just asked for someone to give her a call when she can schedule it

## 2021-12-05 NOTE — Telephone Encounter (Signed)
Lft VM to rtn call regarding her Prolia shot,  the PA has expired in December and we are in process of redoing it now.  Also Prolia is on back order.  Dm/cma

## 2021-12-09 ENCOUNTER — Other Ambulatory Visit: Payer: Self-pay

## 2021-12-09 ENCOUNTER — Ambulatory Visit (INDEPENDENT_AMBULATORY_CARE_PROVIDER_SITE_OTHER): Payer: Medicare PPO | Admitting: Nurse Practitioner

## 2021-12-09 ENCOUNTER — Encounter: Payer: Self-pay | Admitting: Nurse Practitioner

## 2021-12-09 VITALS — BP 134/88 | HR 89 | Temp 96.6°F | Wt 126.6 lb

## 2021-12-09 DIAGNOSIS — J019 Acute sinusitis, unspecified: Secondary | ICD-10-CM | POA: Diagnosis not present

## 2021-12-09 MED ORDER — AMOXICILLIN-POT CLAVULANATE 875-125 MG PO TABS: 1.0000 | ORAL_TABLET | Freq: Two times a day (BID) | ORAL | 0 refills | Status: DC

## 2021-12-09 NOTE — Telephone Encounter (Signed)
Re received the Prolia,   is sh approved to get her Prolia? Dm/cma

## 2021-12-09 NOTE — Patient Instructions (Addendum)
It was great to see you!  Start augmentin antibiotic, 1 pill twice a day. You can gargle with warm salt water to help your sore throat. Drink plenty of fluids. You can continue dayquil and nyquil as needed. Keep benadryl on hand as needed.   Follow up if your symptoms are not improving or are worsening.   Take care,  [name], NP

## 2021-12-09 NOTE — Progress Notes (Signed)
Acute Office Visit  Subjective:    Patient ID: Patricia Peck, female    DOB: 1946/05/17, 76 y.o.   MRN: [id number]  Chief Complaint  Patient presents with   Cough    [name] nasal congestion, cough w/ mucous x1 week.     HPI Patient is in today for sore throat and cough for 1 week. She notes that Saturday evening, she had eating some peanuts and then Saturday night she woke up in the middle of the night and was having some shortness of breath and sore throat. She states the shortness of breath went away after an hour. She still has the sore throat today. Home covid-19 test negative today.   UPPER RESPIRATORY TRACT INFECTION  Fever: no Cough: yes Shortness of breath: only on Saturday night for 1 hour Wheezing: no Chest pain: no Chest tightness: no Chest congestion: no Nasal congestion: yes Runny nose: yes Post nasal drip: yes Sneezing: yes Sore throat: yes Swollen glands: no Sinus pressure: no Headache: no Face pain: no Toothache: no Ear pain: nobilateral Ear pressure: yes "right - chronic Eyes red/itching:no Eye drainage/crusting: no  Vomiting: no Rash: no Fatigue: yes Sick contacts: no Strep contacts: no  Context: stable Recurrent sinusitis: no Relief with OTC cold/cough medications: no  Treatments attempted: dayquil, nyquil    Past Medical History:  Diagnosis Date   Allergy    Arthritis    hands   Asthma    uses an inhaler   Atrial fibrillation (HCC)    Atrial flutter (HCC)    Depression    Dysrhythmia    a-fib, on Xarelto   GERD (gastroesophageal reflux disease)    Hyperlipidemia    Hypertension    Hypothyroidism    Osteoporosis    Stroke (HCC) 2017   TIA   TIA (transient ischemic attack)     Past Surgical History:  Procedure Laterality Date   ABDOMINAL HYSTERECTOMY     COLONOSCOPY     MYRINGOTOMY WITH TUBE PLACEMENT Right 01/14/2016   Procedure: RIGHT MYRINGOTOMY WITH T-TUBE PLACEMENT;  Surgeon: Newman Pies, MD;  Location: [facility];  Service: ENT;  Laterality: Right;   PARTIAL HYSTERECTOMY  1989   ROBOTIC ASSISTED BILATERAL SALPINGO OOPHERECTOMY Bilateral 02/14/2021   Procedure: XI ROBOTIC ASSISTED BILATERAL SALPINGO OOPHORECTOMY;  Surgeon: Adolphus Birchwood, MD;  Location: [facility];  Service: Gynecology;  Laterality: Bilateral;   TEE WITHOUT CARDIOVERSION N/A 12/22/2012   Procedure: TRANSESOPHAGEAL ECHOCARDIOGRAM (TEE);  Surgeon: [name], MD;  Location[facility];  Service: Cardiovascular;  Laterality: N/A;    Family History  Problem Relation Age of Onset   Breast cancer Mother    Drug abuse Son    Depression Son    Stroke Paternal Aunt    Heart disease Paternal Grandfather    Diabetes Maternal Grandfather    Colon cancer Neg Hx    Ovarian cancer Neg Hx    Endometrial cancer Neg Hx    Pancreatic cancer Neg Hx    Prostate cancer Neg Hx     Social History   Socioeconomic History   Marital status: Married    Spouse name: Not on file   Number of children: 2   Years of education: Not on file   Highest education level: Not on file  Occupational History    Employer: RETIRED  Tobacco Use   Smoking status: Never   Smokeless tobacco: Never  Vaping Use   Vaping Use: Never used  Substance and Sexual  Activity   Alcohol use: Yes    Comment: Rare   Drug use: No   Sexual activity: Not Currently  Other Topics Concern   Not on file  Social History Narrative   Does have [name].   Does desire life support if not futile.   Not sure about feeding tubes.   Social Determinants of Health   Financial Resource Strain: Low Risk    Difficulty of Paying Living Expenses: Not hard at all  Food Insecurity: No Food Insecurity   Worried About Programme researcher, broadcasting/film/video in the Last Year: Never true   Ran Out of Food in the Last Year: Never true  Transportation Needs: No Transportation Needs   Lack of Transportation (Medical): No   Lack of Transportation (Non-Medical): No  Physical Activity:  Inactive   Days of Exercise per Week: 0 days   Minutes of Exercise per Session: 0 min  Stress: No Stress Concern Present   Feeling of Stress : Not at all  Social Connections: Moderately Isolated   Frequency of Communication with Friends and Family: Twice a week   Frequency of Social Gatherings with Friends and Family: Twice a week   Attends Religious Services: Never   Database administrator or Organizations: No   Attends Engineer, structural: Never   Marital Status: Married  Catering manager Violence: Not At Risk   Fear of Current or Ex-Partner: No   Emotionally Abused: No   Physically Abused: No   Sexually Abused: No    Outpatient Medications Prior to Visit  Medication Sig Dispense Refill   ADVAIR DISKUS 100-50 MCG/DOSE AEPB Inhale 1 puff into the lungs in the morning and at bedtime. Maintain upcoming appt for additional refill 60 each 0   albuterol (VENTOLIN HFA) 108 (90 Base) MCG/ACT inhaler Inhale 1 puff into the lungs every 6 (six) hours as needed.     Alpha-D-Galactosidase (BEANO PO) Take 1 tablet by mouth 2 (two) times daily.     amLODipine (NORVASC) 5 MG tablet Take 1 tablet (5 mg total) by mouth daily. 90 tablet 2   atorvastatin (LIPITOR) 80 MG tablet Take 1 tablet (80 mg total) by mouth daily. 90 tablet 3   Azelastine-Fluticasone 137-50 MCG/ACT SUSP Place 1 spray into both nostrils daily.     bumetanide (BUMEX) 1 MG tablet Take 1 tablet (1 mg total) by mouth 2 (two) times daily. 180 tablet 1   Calcium Carbonate-Vitamin D 600-400 MG-UNIT tablet Take 1 tablet by mouth 2 (two) times daily.     Cholecalciferol (VITAMIN D-3) 5000 UNITS TABS Take 5,000 Units by mouth 3 (three) times a week. Tuesday Thursday Saturday     Cranberry 450 MG CAPS Take 2 capsules by mouth daily.     denosumab (PROLIA) 60 MG/ML SOLN injection Inject 60 mg into the skin once. Administer in upper arm, thigh, or abdomen (Patient taking differently: Inject 60 mg into the skin every 6 (six) months.  Administer in upper arm, thigh, or abdomen) 180 mL 0   esomeprazole (NEXIUM) 40 MG capsule Take 1 capsule (40 mg total) by mouth daily at 12 noon. 90 capsule 1   estradiol (ESTRACE) 0.1 MG/GM vaginal cream Place 1 Applicatorful vaginally once a week.     ezetimibe (ZETIA) 10 MG tablet Take 1 tablet (10 mg total) by mouth daily. 90 tablet 3   levocetirizine (XYZAL) 5 MG tablet Take 1 tablet (5 mg total) by mouth daily. 90 tablet 1  levothyroxine (SYNTHROID) 50 MCG tablet Take 1 tablet (50 mcg total) by mouth daily. 90 tablet 3   metoprolol succinate (TOPROL-XL) 50 MG 24 hr tablet Take 1 tablet (50 mg total) by mouth daily. Maintain upcoming appt for additional refill 90 tablet 2   mirabegron ER (MYRBETRIQ) 50 MG TB24 tablet Take 50 mg by mouth daily.     naphazoline-pheniramine (EYE ALLERGY RELIEF) 0.025-0.3 % ophthalmic solution Place 1 drop into both eyes daily. (Patient not taking: Reported on 10/18/2021)     OVER THE COUNTER MEDICATION Take 2 tablets by mouth daily. Bounty  Hair,Nail, and Skin daily gummies.     potassium chloride (KLOR-CON) 10 MEQ tablet Take 1 tablet (10 mEq total) by mouth 2 (two) times daily. 180 tablet 1   rOPINIRole (REQUIP) 0.25 MG tablet Take 1 tablet (0.25 mg total) by mouth at bedtime. 90 tablet 3   traZODone (DESYREL) 50 MG tablet Take 1 tablet (50 mg total) by mouth at bedtime. 90 tablet 3   vitamin E 180 MG (400 UNITS) capsule Take 400 Units by mouth 3 (three) times a week. Tuesdays, Thursdays, and Saturdays     XARELTO 20 MG TABS tablet TAKE 1 TABLET(20 MG) BY MOUTH DAILY WITH SUPPER 90 tablet 1   No facility-administered medications prior to visit.    No Known Allergies  Review of Systems See pertinent positives and negatives per HPI.    Objective:    Physical Exam Vitals and nursing note reviewed.  Constitutional:      General: She is not in acute distress.    Appearance: Normal appearance.  HENT:     Head: Normocephalic.     Mouth/Throat:      Mouth: Mucous membranes are moist.     Pharynx: Posterior oropharyngeal erythema present. No oropharyngeal exudate.  Eyes:     Conjunctiva/sclera: Conjunctivae normal.  Cardiovascular:     Rate and Rhythm: Normal rate. Rhythm irregular.     Pulses: Normal pulses.     Heart sounds: Normal heart sounds.  Pulmonary:     Effort: Pulmonary effort is normal.     Breath sounds: Normal breath sounds.  Musculoskeletal:     Cervical back: Normal range of motion.  Skin:    General: Skin is warm.  Neurological:     General: No focal deficit present.     Mental Status: She is alert and oriented to person, place, and time.  Psychiatric:        Mood and Affect: Mood normal.        Behavior: Behavior normal.        Thought Content: Thought content normal.        Judgment: Judgment normal.    BP 134/88 (BP Location: Right Arm, Cuff Size: Normal)    Pulse 89    Temp (!) 96.6 F (35.9 C) (Temporal)    Wt 126 lb 9.6 oz (57.4 kg)    SpO2 97%    BMI 21.73 kg/m  Wt Readings from Last 3 Encounters:  12/09/21 126 lb 9.6 oz (57.4 kg)  10/18/21 124 lb (56.2 kg)  07/16/21 124 lb (56.2 kg)    There are no preventive care reminders to display for this patient.  There are no preventive care reminders to display for this patient.   Lab Results  Component Value Date   TSH 0.99 07/19/2021   Lab Results  Component Value Date   WBC 7.8 10/18/2021   HGB 13.2 10/18/2021   HCT 40.4 10/18/2021  MCV 90 10/18/2021   PLT 328 10/18/2021   Lab Results  Component Value Date   NA 141 10/18/2021   K 3.9 10/18/2021   CO2 24 10/18/2021   GLUCOSE 89 10/18/2021   BUN 13 10/18/2021   CREATININE 0.93 10/18/2021   BILITOT 0.6 10/18/2021   ALKPHOS 80 10/18/2021   AST 18 10/18/2021   ALT 17 10/18/2021   PROT 6.7 10/18/2021   ALBUMIN 4.8 (H) 10/18/2021   CALCIUM 9.4 10/18/2021   ANIONGAP 10 02/12/2021   EGFR 64 10/18/2021   GFR 69.64 07/19/2021   Lab Results  Component Value Date   CHOL 107 07/19/2021    Lab Results  Component Value Date   HDL 42.00 07/19/2021   Lab Results  Component Value Date   LDLCALC 44 07/19/2021   Lab Results  Component Value Date   TRIG 103.0 07/19/2021   Lab Results  Component Value Date   CHOLHDL 3 07/19/2021   Lab Results  Component Value Date   HGBA1C 5.7 09/07/2017       Assessment & Plan:   Problem List Items Addressed This Visit   None Visit Diagnoses     Acute non-recurrent sinusitis, unspecified location    -  Primary   With symptoms ongoing for >7 days will treat with augmentin BID x10 days. Gargle with warm salt water prn sore throat. F/U if not improving   Relevant Medications   amoxicillin-clavulanate (AUGMENTIN) 875-125 MG tablet        Meds ordered this encounter  Medications   amoxicillin-clavulanate (AUGMENTIN) 875-125 MG tablet    Sig: Take 1 tablet by mouth 2 (two) times daily.    Dispense:  20 tablet    Refill:  0     Charyl Dancer, NP

## 2021-12-10 DIAGNOSIS — R8271 Bacteriuria: Secondary | ICD-10-CM | POA: Diagnosis not present

## 2021-12-10 DIAGNOSIS — N3946 Mixed incontinence: Secondary | ICD-10-CM | POA: Diagnosis not present

## 2021-12-10 DIAGNOSIS — N3021 Other chronic cystitis with hematuria: Secondary | ICD-10-CM | POA: Diagnosis not present

## 2021-12-12 DIAGNOSIS — Z20822 Contact with and (suspected) exposure to covid-19: Secondary | ICD-10-CM | POA: Diagnosis not present

## 2021-12-12 DIAGNOSIS — Z03818 Encounter for observation for suspected exposure to other biological agents ruled out: Secondary | ICD-10-CM | POA: Diagnosis not present

## 2021-12-23 ENCOUNTER — Telehealth: Payer: Self-pay

## 2021-12-23 NOTE — Progress Notes (Signed)
Chronic Care Management Pharmacy Assistant   Name: Patricia Peck  MRN: [id number] DOB: Mar 11, 1946  Reason for Encounter: Medication Review/General adherence Call.   Recent office visits:  12/09/2021 Patricia Peper NP (PCP Office) Start Amoxicillin 709-628 mg   Recent consult visits:  10/18/2021 Dr. Stanford Breed MD (Cardiology) No Medication Changes noted.  Hospital visits:  None in previous 6 months  Medications: Outpatient Encounter Medications as of 12/23/2021  Medication Sig   ADVAIR DISKUS 100-50 MCG/DOSE AEPB Inhale 1 puff into the lungs in the morning and at bedtime. Maintain upcoming appt for additional refill   albuterol (VENTOLIN HFA) 108 (90 Base) MCG/ACT inhaler Inhale 1 puff into the lungs every 6 (six) hours as needed.   Alpha-D-Galactosidase (BEANO PO) Take 1 tablet by mouth 2 (two) times daily.   amLODipine (NORVASC) 5 MG tablet Take 1 tablet (5 mg total) by mouth daily.   amoxicillin-clavulanate (AUGMENTIN) 875-125 MG tablet Take 1 tablet by mouth 2 (two) times daily.   atorvastatin (LIPITOR) 80 MG tablet Take 1 tablet (80 mg total) by mouth daily.   Azelastine-Fluticasone 137-50 MCG/ACT SUSP Place 1 spray into both nostrils daily.   bumetanide (BUMEX) 1 MG tablet Take 1 tablet (1 mg total) by mouth 2 (two) times daily.   Calcium Carbonate-Vitamin D 600-400 MG-UNIT tablet Take 1 tablet by mouth 2 (two) times daily.   Cholecalciferol (VITAMIN D-3) 5000 UNITS TABS Take 5,000 Units by mouth 3 (three) times a week. Tuesday Thursday Saturday   Cranberry 450 MG CAPS Take 2 capsules by mouth daily.   denosumab (PROLIA) 60 MG/ML SOLN injection Inject 60 mg into the skin once. Administer in upper arm, thigh, or abdomen (Patient taking differently: Inject 60 mg into the skin every 6 (six) months. Administer in upper arm, thigh, or abdomen)   esomeprazole (NEXIUM) 40 MG capsule Take 1 capsule (40 mg total) by mouth daily at 12 noon.   estradiol (ESTRACE) 0.1 MG/GM vaginal cream  Place 1 Applicatorful vaginally once a week.   ezetimibe (ZETIA) 10 MG tablet Take 1 tablet (10 mg total) by mouth daily.   levocetirizine (XYZAL) 5 MG tablet Take 1 tablet (5 mg total) by mouth daily.   levothyroxine (SYNTHROID) 50 MCG tablet Take 1 tablet (50 mcg total) by mouth daily.   metoprolol succinate (TOPROL-XL) 50 MG 24 hr tablet Take 1 tablet (50 mg total) by mouth daily. Maintain upcoming appt for additional refill   mirabegron ER (MYRBETRIQ) 50 MG TB24 tablet Take 50 mg by mouth daily.   naphazoline-pheniramine (EYE ALLERGY RELIEF) 0.025-0.3 % ophthalmic solution Place 1 drop into both eyes daily. (Patient not taking: Reported on 10/18/2021)   OVER THE COUNTER MEDICATION Take 2 tablets by mouth daily. Bounty  Hair,Nail, and Skin daily gummies.   potassium chloride (KLOR-CON) 10 MEQ tablet Take 1 tablet (10 mEq total) by mouth 2 (two) times daily.   rOPINIRole (REQUIP) 0.25 MG tablet Take 1 tablet (0.25 mg total) by mouth at bedtime.   traZODone (DESYREL) 50 MG tablet Take 1 tablet (50 mg total) by mouth at bedtime.   vitamin E 180 MG (400 UNITS) capsule Take 400 Units by mouth 3 (three) times a week. Tuesdays, Thursdays, and Saturdays   XARELTO 20 MG TABS tablet TAKE 1 TABLET(20 MG) BY MOUTH DAILY WITH SUPPER   [DISCONTINUED] HYDROcodone-acetaminophen (NORCO/VICODIN) 5-325 MG tablet Take 1 tablet by mouth every 6 (six) hours as needed for moderate pain. (Patient not taking: No sig reported)   No facility-administered  encounter medications on file as of 12/23/2021.    Care Gaps: None ID Star Rating Drugs: Atorvastatin 80 mg last filled 10/09/2021 90 day supply at [facility]. Medication Fill Gaps:y None ID  Called patient and discussed medication adherence  with patient, no issues at this time with current medication.   Patient Denies ED visit since her last CPP follow up.  Patient Denies  any side effects with her medication. Patient Denies  any problems with hercurrent  pharmacy  PHQ9 Screening   PHQ9 SCORE ONLY 10/09/2021 10/09/2021 07/16/2021  PHQ-9 Total Score 0 0 0     Not at all(0) Several days (1)More than half the days (2)Nearly every day(3) 1. Little interest or pleasure in doing things  0 2. Feeling down, depressed, or hopeless   0 3. Trouble falling or staying asleep, or sleeping too much  1- patient states she has always had problems falling asleep, but thinks it is genetic because her family has the same issue. 4. Feeling tired or having little energy   3- patient states she does feel tired a lot because she has a hard time falling asleep, and her Husband uses the bathroom a lot through out the night.Patient reports she is going to follow up with her Neurologist soon for her sleep concerns. 5. Poor appetite or overeating   0 6. Feeling bad about yourself or that you are a failure or  have let yourself or your family down   0 7. Trouble concentrating on things, such as reading the  newspaper or watching television  0 8. Moving or speaking so slowly that other people could  have noticed. Or the opposite being so fidgety or  restless that you have been moving around a lot more  than usual  0 9. Thoughts that you would be better off dead, or of  hurting yourself   0  10. If you checked off any problems, how difficult have these problems made it for you to do  your work, take care of things at home, or get  along with other people?   Not difficult at all  Patient states she just spoke with the clinical pharmacist, and was unsure why I was asking her similar questions.Inform the patient that it is part of CCM service that I reach out to her to check in to make sure she is still doing good, and not having any issues. Patient states she prefers to be reach once every three months, and not every other month, as she would reach out to her provider if something was wrong.  Telephone follow up appointment with care management team member  scheduled for:  04/10/2022 at 11:00 AM  [name] Pharmacist Assistant [phone]

## 2022-01-01 DIAGNOSIS — D225 Melanocytic nevi of trunk: Secondary | ICD-10-CM | POA: Diagnosis not present

## 2022-01-01 DIAGNOSIS — L821 Other seborrheic keratosis: Secondary | ICD-10-CM | POA: Diagnosis not present

## 2022-01-01 DIAGNOSIS — B078 Other viral warts: Secondary | ICD-10-CM | POA: Diagnosis not present

## 2022-01-01 DIAGNOSIS — Z1283 Encounter for screening for malignant neoplasm of skin: Secondary | ICD-10-CM | POA: Diagnosis not present

## 2022-01-01 DIAGNOSIS — R208 Other disturbances of skin sensation: Secondary | ICD-10-CM | POA: Diagnosis not present

## 2022-01-17 ENCOUNTER — Other Ambulatory Visit: Payer: Self-pay | Admitting: Nurse Practitioner

## 2022-01-17 DIAGNOSIS — I10 Essential (primary) hypertension: Secondary | ICD-10-CM

## 2022-01-17 DIAGNOSIS — E876 Hypokalemia: Secondary | ICD-10-CM

## 2022-01-21 DIAGNOSIS — N3946 Mixed incontinence: Secondary | ICD-10-CM | POA: Diagnosis not present

## 2022-01-21 DIAGNOSIS — N3000 Acute cystitis without hematuria: Secondary | ICD-10-CM | POA: Diagnosis not present

## 2022-01-23 ENCOUNTER — Encounter: Payer: Self-pay | Admitting: Nurse Practitioner

## 2022-01-23 ENCOUNTER — Ambulatory Visit (INDEPENDENT_AMBULATORY_CARE_PROVIDER_SITE_OTHER): Payer: Medicare PPO | Admitting: Nurse Practitioner

## 2022-01-23 VITALS — BP 134/87 | HR 82 | Temp 96.9°F | Wt 126.2 lb

## 2022-01-23 DIAGNOSIS — J302 Other seasonal allergic rhinitis: Secondary | ICD-10-CM

## 2022-01-23 MED ORDER — MONTELUKAST SODIUM 10 MG PO TABS: 10.0000 mg | ORAL_TABLET | Freq: Every day | ORAL | 1 refills | Status: DC

## 2022-01-23 NOTE — Assessment & Plan Note (Signed)
She was recently treated for sinus infection on 12/09/2021.  She states that she has been having ongoing congestion and cough.  She uses her Advair inhaler twice a day and albuterol inhaler as needed.  She states that she uses her albuterol about once a month.  She has tried multiple over-the-counter medications which have not helped including Flonase, Astelin nasal spray, Zyrtec, Claritin, Xyzal.  We will start montelukast 10 mg daily at bedtime.  Encouraged her to continue the Flonase or Astelin nasal spray, and normal saline nasal spray.  Follow-up in 4 weeks.  Consider referral to either allergist or ENT if symptoms not improving. ?

## 2022-01-23 NOTE — Progress Notes (Signed)
? ?Acute Office Visit ? ?Subjective:  ? ? Patient ID: Patricia Peck, female    DOB: Dec 31, 1945, 76 y.o.   MRN: [id number] ? ?Chief Complaint  ?Patient presents with  ? Follow-up  ?  [name] congestion w/ phlegm for several months, pt states believe to be allergies  ? ? ?HPI ?Patient is in today for congestion and cough for several months.  On 12/09/2021 she was treated with a 10-day course of Augmentin.  She uses her Advair inhaler twice a day and uses an albuterol inhaler about once a month for shortness of breath. ? ?UPPER RESPIRATORY TRACT INFECTION ? ?Fever: no ?Cough: yes ?Shortness of breath: no ?Wheezing: no ?Chest pain: no ?Chest tightness: no ?Chest congestion: yes ?Nasal congestion: yes ?Runny nose: yes ?Post nasal drip: yes ?Sneezing: yes ?Sore throat: no ?Swollen glands: no ?Sinus pressure: no ?Headache: no ?Face pain: no ?Toothache: no ?Ear pain: no bilateral ?Ear pressure: no bilateral ?Eyes red/itching:no ?Eye drainage/crusting: no  ?Vomiting: no ?Rash: no ?Fatigue: yes ?Sick contacts: no ?Strep contacts: no  ?Context: stable ?Recurrent sinusitis: no ?Relief with OTC cold/cough medications: no  ?Treatments attempted: allegra, zyrtec, claritin, flonase, Astelin nasal spray ? ? ?Past Medical History:  ?Diagnosis Date  ? Allergy   ? Arthritis   ? hands  ? Asthma   ? uses an inhaler  ? Atrial fibrillation ([place])   ? Atrial flutter ([place])   ? Depression   ? Dysrhythmia   ? a-fib, on Xarelto  ? GERD (gastroesophageal reflux disease)   ? Hyperlipidemia   ? Hypertension   ? Hypothyroidism   ? Osteoporosis   ? Stroke [facility]) 2017  ? TIA  ? TIA (transient ischemic attack)   ? ? ?Past Surgical History:  ?Procedure Laterality Date  ? ABDOMINAL HYSTERECTOMY    ? COLONOSCOPY    ? MYRINGOTOMY WITH TUBE PLACEMENT Right 01/14/2016  ? Procedure: RIGHT MYRINGOTOMY WITH T-TUBE PLACEMENT;  Surgeon: Leta Baptist, MD;  Location: [place];  Service: ENT;  Laterality: Right;  ? PARTIAL HYSTERECTOMY  1989  ? ROBOTIC  ASSISTED BILATERAL SALPINGO OOPHERECTOMY Bilateral 02/14/2021  ? Procedure: XI ROBOTIC ASSISTED BILATERAL SALPINGO OOPHORECTOMY;  Surgeon: Everitt Amber, MD;  Location: [facility];  Service: Gynecology;  Laterality: Bilateral;  ? TEE WITHOUT CARDIOVERSION N/A 12/22/2012  ? Procedure: TRANSESOPHAGEAL ECHOCARDIOGRAM (TEE);  Surgeon: Lelon Perla, MD;  Location: [place];  Service: Cardiovascular;  Laterality: N/A;  ? ? ?Family History  ?Problem Relation Age of Onset  ? Breast cancer Mother   ? Drug abuse Son   ? Depression Son   ? Stroke Paternal Aunt   ? Heart disease Paternal Grandfather   ? Diabetes Maternal Grandfather   ? Colon cancer Neg Hx   ? Ovarian cancer Neg Hx   ? Endometrial cancer Neg Hx   ? Pancreatic cancer Neg Hx   ? Prostate cancer Neg Hx   ? ? ?Social History  ? ?Socioeconomic History  ? Marital status: Married  ?  Spouse name: Not on file  ? Number of children: 2  ? Years of education: Not on file  ? Highest education level: Not on file  ?Occupational History  ?  Employer: RETIRED  ?Tobacco Use  ? Smoking status: Never  ? Smokeless tobacco: Never  ?Vaping Use  ? Vaping Use: Never used  ?Substance and Sexual Activity  ? Alcohol use: Yes  ?  Comment: Rare  ? Drug use: No  ? Sexual activity: Not Currently  ?  Other Topics Concern  ? Not on file  ?Social History Narrative  ? Does have HPOA- sons Eddie Dibbles and Wille Glaser.  ? Does desire life support if not futile.  ? Not sure about feeding tubes.  ? ?Social Determinants of Health  ? ?Financial Resource Strain: Low Risk   ? Difficulty of Paying Living Expenses: Not hard at all  ?Food Insecurity: No Food Insecurity  ? Worried About Charity fundraiser in the Last Year: Never true  ? Ran Out of Food in the Last Year: Never true  ?Transportation Needs: No Transportation Needs  ? Lack of Transportation (Medical): No  ? Lack of Transportation (Non-Medical): No  ?Physical Activity: Inactive  ? Days of Exercise per Week: 0 days  ? Minutes of Exercise per Session: 0 min   ?Stress: No Stress Concern Present  ? Feeling of Stress : Not at all  ?Social Connections: Moderately Isolated  ? Frequency of Communication with Friends and Family: Twice a week  ? Frequency of Social Gatherings with Friends and Family: Twice a week  ? Attends Religious Services: Never  ? Active Member of Clubs or Organizations: No  ? Attends Archivist Meetings: Never  ? Marital Status: Married  ?Intimate Partner Violence: Not At Risk  ? Fear of Current or Ex-Partner: No  ? Emotionally Abused: No  ? Physically Abused: No  ? Sexually Abused: No  ? ? ?Outpatient Medications Prior to Visit  ?Medication Sig Dispense Refill  ? ADVAIR DISKUS 100-50 MCG/DOSE AEPB Inhale 1 puff into the lungs in the morning and at bedtime. Maintain upcoming appt for additional refill 60 each 0  ? albuterol (VENTOLIN HFA) 108 (90 Base) MCG/ACT inhaler Inhale 1 puff into the lungs every 6 (six) hours as needed.    ? Alpha-D-Galactosidase (BEANO PO) Take 1 tablet by mouth 2 (two) times daily.    ? amLODipine (NORVASC) 5 MG tablet Take 1 tablet (5 mg total) by mouth daily. 90 tablet 2  ? amoxicillin-clavulanate (AUGMENTIN) 875-125 MG tablet Take 1 tablet by mouth 2 (two) times daily. 20 tablet 0  ? atorvastatin (LIPITOR) 80 MG tablet Take 1 tablet (80 mg total) by mouth daily. 90 tablet 3  ? Azelastine-Fluticasone 137-50 MCG/ACT SUSP Place 1 spray into both nostrils daily.    ? bumetanide (BUMEX) 1 MG tablet TAKE 1 TABLET(1 MG) BY MOUTH TWICE DAILY 180 tablet 1  ? Calcium Carbonate-Vitamin D 600-400 MG-UNIT tablet Take 1 tablet by mouth 2 (two) times daily.    ? Cholecalciferol (VITAMIN D-3) 5000 UNITS TABS Take 5,000 Units by mouth 3 (three) times a week. Tuesday Thursday Saturday    ? Cranberry 450 MG CAPS Take 2 capsules by mouth daily.    ? denosumab (PROLIA) 60 MG/ML SOLN injection Inject 60 mg into the skin once. Administer in upper arm, thigh, or abdomen (Patient taking differently: Inject 60 mg into the skin every 6 (six)  months. Administer in upper arm, thigh, or abdomen) 180 mL 0  ? esomeprazole (NEXIUM) 40 MG capsule Take 1 capsule (40 mg total) by mouth daily at 12 noon. 90 capsule 1  ? estradiol (ESTRACE) 0.1 MG/GM vaginal cream Place 1 Applicatorful vaginally once a week.    ? ezetimibe (ZETIA) 10 MG tablet Take 1 tablet (10 mg total) by mouth daily. 90 tablet 3  ? levocetirizine (XYZAL) 5 MG tablet Take 1 tablet (5 mg total) by mouth daily. 90 tablet 1  ? levothyroxine (SYNTHROID) 50 MCG tablet Take 1 tablet (  50 mcg total) by mouth daily. 90 tablet 3  ? metoprolol succinate (TOPROL-XL) 50 MG 24 hr tablet Take 1 tablet (50 mg total) by mouth daily. Maintain upcoming appt for additional refill 90 tablet 2  ? mirabegron ER (MYRBETRIQ) 50 MG TB24 tablet Take 50 mg by mouth daily.    ? naphazoline-pheniramine (EYE ALLERGY RELIEF) 0.025-0.3 % ophthalmic solution Place 1 drop into both eyes daily. (Patient not taking: Reported on 10/18/2021)    ? OVER THE COUNTER MEDICATION Take 2 tablets by mouth daily. Bounty  Hair,Nail, and Skin daily gummies.    ? potassium chloride (KLOR-CON) 10 MEQ tablet TAKE 1 TABLET(10 MEQ) BY MOUTH TWICE DAILY 180 tablet 1  ? rOPINIRole (REQUIP) 0.25 MG tablet Take 1 tablet (0.25 mg total) by mouth at bedtime. 90 tablet 3  ? traZODone (DESYREL) 50 MG tablet Take 1 tablet (50 mg total) by mouth at bedtime. 90 tablet 3  ? vitamin E 180 MG (400 UNITS) capsule Take 400 Units by mouth 3 (three) times a week. Tuesdays, Thursdays, and Saturdays    ? XARELTO 20 MG TABS tablet TAKE 1 TABLET(20 MG) BY MOUTH DAILY WITH SUPPER 90 tablet 1  ? ?No facility-administered medications prior to visit.  ? ? ?No Known Allergies ? ?Review of Systems ?See pertinent positives and negatives per HPI. ?   ?Objective:  ?  ?Physical Exam ?Vitals and nursing note reviewed.  ?Constitutional:   ?   General: She is not in acute distress. ?   Appearance: Normal appearance.  ?HENT:  ?   Head: Normocephalic.  ?   Comments: No maxillary or  frontal sinus tenderness bilaterally ?   Right Ear: Tympanic membrane, ear canal and external ear normal.  ?   Left Ear: Tympanic membrane, ear canal and external ear normal.  ?   Nose: Congestion present.  ?   Mout

## 2022-01-23 NOTE — Patient Instructions (Signed)
It was great to see you! ? ?Start montelukast (singulair) 1 tablet at bedtime. You can continue any of the other medications that have helped, flonase, astelin, zyrtec, claritin.  ? ?Reach out to Dr. Stanford Breed to follow-up on your a-fib ? ?Let's follow-up in 4 weeks, sooner if you have concerns. ? ?If a referral was placed today, you will be contacted for an appointment. Please note that routine referrals can sometimes take up to 3-4 weeks to process. Please call our office if you haven't heard anything after this time frame. ? ?Take care, ? ?[name], NP ? ?

## 2022-01-24 ENCOUNTER — Telehealth: Payer: Self-pay | Admitting: Nurse Practitioner

## 2022-01-24 NOTE — Telephone Encounter (Signed)
It was suggested that I send this message to you. Pt is inquiring if the prolia shot is in stock and not out of date. She would like to schedule but is unsure of when? ?

## 2022-01-24 NOTE — Telephone Encounter (Signed)
Patient scheduled for prolia shot on 01/28/22 @ 10:00 am.  Dm/cma ? ?

## 2022-01-28 ENCOUNTER — Telehealth: Payer: Self-pay | Admitting: *Deleted

## 2022-01-28 ENCOUNTER — Other Ambulatory Visit: Payer: Self-pay

## 2022-01-28 ENCOUNTER — Ambulatory Visit (INDEPENDENT_AMBULATORY_CARE_PROVIDER_SITE_OTHER): Payer: Medicare PPO

## 2022-01-28 DIAGNOSIS — M810 Age-related osteoporosis without current pathological fracture: Secondary | ICD-10-CM

## 2022-01-28 MED ORDER — AMLODIPINE BESYLATE 10 MG PO TABS: 10.0000 mg | ORAL_TABLET | Freq: Every day | ORAL | 3 refills | Status: DC

## 2022-01-28 MED ORDER — DENOSUMAB 60 MG/ML ~~LOC~~ SOSY
60.0000 mg | PREFILLED_SYRINGE | Freq: Once | SUBCUTANEOUS | Status: AC
  Administered 2022-01-28: 60 mg via SUBCUTANEOUS

## 2022-01-28 NOTE — Telephone Encounter (Signed)
Spoke with pt, Aware of dr crenshaw's recommendations. New script sent to the pharmacy  

## 2022-01-28 NOTE — Progress Notes (Signed)
After obtaining consent, and per orders of Charlotte Nche, injection of Prolia given by Shayonna Ocampo. Patient instructed to remain in clinic for 20 minutes afterwards, and to report any adverse reaction to me immediately.  

## 2022-01-28 NOTE — Telephone Encounter (Signed)
Spoke with pt re message and pt has noticed more fatigue over the last month  Also pt feels B/P has been running higher for several months . Pt checked B/P this am while on phone and B/P was 144/96 and HR 66 before meds Pt has a lot of stressors at this time. as well, with caring for husband  Pt has appt  in June not sure if can wait this long Will forward to Dr Stanford Breed for review and recommendations ./cy ?

## 2022-02-24 ENCOUNTER — Ambulatory Visit (INDEPENDENT_AMBULATORY_CARE_PROVIDER_SITE_OTHER): Payer: Medicare PPO | Admitting: Nurse Practitioner

## 2022-02-24 ENCOUNTER — Encounter: Payer: Self-pay | Admitting: Nurse Practitioner

## 2022-02-24 ENCOUNTER — Other Ambulatory Visit (HOSPITAL_COMMUNITY)
Admission: RE | Admit: 2022-02-24 | Discharge: 2022-02-24 | Disposition: A | Discharge status: Home / Self Care | Payer: Medicare PPO | Source: Ambulatory Visit | Attending: Nurse Practitioner | Admitting: Nurse Practitioner

## 2022-02-24 VITALS — BP 130/70 | HR 64 | Temp 96.6°F | Ht 64.0 in | Wt 126.8 lb

## 2022-02-24 DIAGNOSIS — R300 Dysuria: Secondary | ICD-10-CM | POA: Diagnosis not present

## 2022-02-24 DIAGNOSIS — J309 Allergic rhinitis, unspecified: Secondary | ICD-10-CM | POA: Diagnosis not present

## 2022-02-24 DIAGNOSIS — N3000 Acute cystitis without hematuria: Secondary | ICD-10-CM | POA: Diagnosis not present

## 2022-02-24 DIAGNOSIS — I480 Paroxysmal atrial fibrillation: Secondary | ICD-10-CM | POA: Diagnosis not present

## 2022-02-24 DIAGNOSIS — J300 Vasomotor rhinitis: Secondary | ICD-10-CM | POA: Insufficient documentation

## 2022-02-24 DIAGNOSIS — N898 Other specified noninflammatory disorders of vagina: Secondary | ICD-10-CM

## 2022-02-24 DIAGNOSIS — J4520 Mild intermittent asthma, uncomplicated: Secondary | ICD-10-CM | POA: Diagnosis not present

## 2022-02-24 DIAGNOSIS — K219 Gastro-esophageal reflux disease without esophagitis: Secondary | ICD-10-CM

## 2022-02-24 NOTE — Assessment & Plan Note (Addendum)
Chronic nasal congestion, sneezing, watery/itchy eyes, intermittent hoarseness and cough. ?No SOB, no CP, no fever, no purulent drainage, no sinus tenderness. ?No GERD symptoms with use of nexium. ?No improvement with montelukast, OTC antihistamine (zyrtec, xyzal, claritin, allegra), dymista, and eye allergy relief drops ?Allergy test completed several years ago per patient (negative). ?Hx of dry eyes, current use of systane, unable to afford restasis ?Minimal improvement with oral abx: augmentin 29month ago. ? ?Entered referal to allergist ?Maintain current medications ?

## 2022-02-24 NOTE — Assessment & Plan Note (Signed)
Rate controlled with metoprolol ?Anticoagulated with xarelto  ?Followed by Dr. Stanford Breed ?

## 2022-02-24 NOTE — Assessment & Plan Note (Signed)
Stable with nexium ?

## 2022-02-24 NOTE — Assessment & Plan Note (Signed)
Controlled with advair daily and albuterol prn ?

## 2022-02-24 NOTE — Patient Instructions (Signed)
You will be contacted to schedule appt with allergist ?Go to lab ?

## 2022-02-24 NOTE — Assessment & Plan Note (Addendum)
Onset 1week ago. Associated with vaginal odor. ?She is not sexually active. ?S/p hysterectomy and oophrectomy ?Hx of urinary incontinence, followed by [facility], current use of mybertrig, cranbery tabs and D-mannose. ?Treated for UTI 59monthago per patient. ?No constipation, no diarrhea. ? ?Possible due to vaginal atrophy? ?Check urinalysis and wet prep ?

## 2022-02-24 NOTE — Progress Notes (Signed)
? ?             Established Patient Visit ? ?Patient: Patricia Peck   DOB: Jul 21, 1946   76 y.o. Female  MRN: [id number] ?Visit Date: 02/25/2022 ? ?Subjective:  ?  ?Chief Complaint  ?Patient presents with  ? Follow-up  ?  4 week f/u on seasonal allergies. Pt also c/o of possible UTI.   ? ?HPI ?Allergic rhinitis ?Chronic nasal congestion, sneezing, watery/itchy eyes, intermittent hoarseness and cough. ?No SOB, no CP, no fever, no purulent drainage, no sinus tenderness. ?No GERD symptoms with use of nexium. ?No improvement with montelukast, OTC antihistamine (zyrtec, xyzal, claritin, allegra), dymista, and eye allergy relief drops ?Allergy test completed several years ago per patient (negative). ?Hx of dry eyes, current use of systane, unable to afford restasis ?Minimal improvement with oral abx: augmentin 26month ago. ? ?Entered referal to allergist ?Maintain current medications ? ?Mild intermittent asthma without complication ?Controlled with advair daily and albuterol prn ? ?GERD ?Stable with nexium ? ?Dysuria ?Onset 1week ago. Associated with vaginal odor. ?She is not sexually active. ?S/p hysterectomy and oophrectomy ?Hx of urinary incontinence, followed by [facility], current use of mybertrig, cranbery tabs and D-mannose. ?Treated for UTI 126monthgo per patient. ?No constipation, no diarrhea. ? ?Possible due to vaginal atrophy? ?Check urinalysis and wet prep ? ?[place]?Rate controlled with metoprolol ?Anticoagulated with xarelto  ?Followed by Dr. CrStanford Breed ?Reviewed medical, surgical, and social history today ? ?Medications: ?Outpatient Medications Prior to Visit  ?Medication Sig  ? ADVAIR DISKUS 100-50 MCG/DOSE AEPB Inhale 1 puff into the lungs in the morning and at bedtime. Maintain upcoming appt for additional refill  ? albuterol (VENTOLIN HFA) 108 (90 Base) MCG/ACT inhaler Inhale 1 puff into the lungs every 6 (six) hours as needed.  ? Alpha-D-Galactosidase (BEANO PO)  Take 1 tablet by mouth 2 (two) times daily.  ? amLODipine (NORVASC) 10 MG tablet Take 1 tablet (10 mg total) by mouth daily.  ? atorvastatin (LIPITOR) 80 MG tablet Take 1 tablet (80 mg total) by mouth daily.  ? Azelastine-Fluticasone 137-50 MCG/ACT SUSP Place 1 spray into both nostrils daily.  ? bumetanide (BUMEX) 1 MG tablet TAKE 1 TABLET(1 MG) BY MOUTH TWICE DAILY  ? Calcium Carbonate-Vitamin D 600-400 MG-UNIT tablet Take 1 tablet by mouth 2 (two) times daily.  ? Cholecalciferol (VITAMIN D-3) 5000 UNITS TABS Take 5,000 Units by mouth 3 (three) times a week. Tuesday Thursday Saturday  ? Cranberry 450 MG CAPS Take 2 capsules by mouth daily.  ? denosumab (PROLIA) 60 MG/ML SOLN injection Inject 60 mg into the skin once. Administer in upper arm, thigh, or abdomen (Patient taking differently: Inject 60 mg into the skin every 6 (six) months. Administer in upper arm, thigh, or abdomen)  ? esomeprazole (NEXIUM) 40 MG capsule Take 1 capsule (40 mg total) by mouth daily at 12 noon.  ? estradiol (ESTRACE) 0.1 MG/GM vaginal cream Place 1 Applicatorful vaginally once a week.  ? levocetirizine (XYZAL) 5 MG tablet Take 1 tablet (5 mg total) by mouth daily.  ? levothyroxine (SYNTHROID) 50 MCG tablet Take 1 tablet (50 mcg total) by mouth daily.  ? metoprolol succinate (TOPROL-XL) 50 MG 24 hr tablet Take 1 tablet (50 mg total) by mouth daily. Maintain upcoming appt for additional refill  ? mirabegron ER (MYRBETRIQ) 50 MG TB24 tablet Take 50 mg by mouth daily.  ? montelukast (SINGULAIR) 10 MG tablet Take 1 tablet (10 mg total) by mouth at  bedtime.  ? naphazoline-pheniramine (EYE ALLERGY RELIEF) 0.025-0.3 % ophthalmic solution Place 1 drop into both eyes daily.  ? OVER THE COUNTER MEDICATION Take 2 tablets by mouth daily. Bounty  Hair,Nail, and Skin daily gummies.  ? potassium chloride (KLOR-CON) 10 MEQ tablet TAKE 1 TABLET(10 MEQ) BY MOUTH TWICE DAILY  ? rOPINIRole (REQUIP) 0.25 MG tablet Take 1 tablet (0.25 mg total) by mouth at  bedtime.  ? traZODone (DESYREL) 50 MG tablet Take 1 tablet (50 mg total) by mouth at bedtime.  ? vitamin E 180 MG (400 UNITS) capsule Take 400 Units by mouth 3 (three) times a week. Tuesdays, Thursdays, and Saturdays  ? XARELTO 20 MG TABS tablet TAKE 1 TABLET(20 MG) BY MOUTH DAILY WITH SUPPER  ? [DISCONTINUED] amoxicillin-clavulanate (AUGMENTIN) 875-125 MG tablet Take 1 tablet by mouth 2 (two) times daily.  ? ezetimibe (ZETIA) 10 MG tablet Take 1 tablet (10 mg total) by mouth daily.  ? ?No facility-administered medications prior to visit.  ? ?Reviewed past medical and social history.  ? ?ROS per HPI above ? ? ?   ?Objective:  ?BP 130/70 (BP Location: Right Arm, Patient Position: Sitting, Cuff Size: Normal)   Pulse 64   Temp (!) 96.6 ?F (35.9 ?C) (Temporal)   Ht '5\' 4"'$  (1.626 m)   Wt 126 lb 12.8 oz (57.5 kg)   SpO2 97%   BMI 21.77 kg/m?  ? ?  ? ?Physical Exam ?Vitals reviewed. Exam conducted with a chaperone present.  ?Constitutional:   ?   General: She is not in acute distress. ?Cardiovascular:  ?   Rate and Rhythm: Normal rate.  ?   Pulses: Normal pulses.  ?Pulmonary:  ?   Effort: Pulmonary effort is normal.  ?   Breath sounds: Normal breath sounds.  ?Genitourinary: ?   General: Normal vulva.  ?   Exam position: Knee-chest position.  ?   Labia:     ?   Right: No rash, tenderness or lesion.     ?   Left: No rash, tenderness or lesion.   ?   Urethra: Prolapse present. No urethral pain.  ?   Vagina: No vaginal discharge, erythema, tenderness or lesions.  ?   Uterus: Not enlarged and not tender.   ?   Adnexa: Right adnexa normal and left adnexa normal.  ?Lymphadenopathy:  ?   Lower Body: No right inguinal adenopathy. No left inguinal adenopathy.  ?Neurological:  ?   Mental Status: She is alert and oriented to person, place, and time.  ?  ?Results for orders placed or performed in visit on 02/24/22  ?Urinalysis w microscopic + reflex cultur  ? Specimen: Urine  ?Result Value Ref Range  ? Color, Urine YELLOW YELLOW   ? APPearance CLOUDY (A) CLEAR  ? Specific Gravity, Urine 1.015 1.001 - 1.035  ? pH 6.5 5.0 - 8.0  ? Glucose, UA NEGATIVE NEGATIVE  ? Bilirubin Urine NEGATIVE NEGATIVE  ? Ketones, ur NEGATIVE NEGATIVE  ? Hgb urine dipstick NEGATIVE NEGATIVE  ? Protein, ur NEGATIVE NEGATIVE  ? Nitrites, Initial POSITIVE (A) NEGATIVE  ? Leukocyte Esterase 3+ (A) NEGATIVE  ? WBC, UA > OR = 60 (A) 0 - 5 /HPF  ? RBC / HPF 0-2 0 - 2 /HPF  ? Squamous Epithelial / LPF 0-5 < OR = 5 /HPF  ? Bacteria, UA MANY (A) NONE SEEN /HPF  ? Hyaline Cast NONE SEEN NONE SEEN /LPF  ?REFLEXIVE URINE CULTURE  ?Result Value Ref Range  ? REFLEXIVE  URINE CULTURE    ? ?   ?Assessment & Plan:  ?  ?Problem List Items Addressed This Visit   ? ?  ? Cardiovascular and Mediastinum  ? PAF (paroxysmal atrial fibrillation) ([place])  ?  Rate controlled with metoprolol ?Anticoagulated with xarelto  ?Followed by Dr. Stanford Breed ? ?  ?  ?  ? Respiratory  ? Allergic rhinitis - Primary  ?  Chronic nasal congestion, sneezing, watery/itchy eyes, intermittent hoarseness and cough. ?No SOB, no CP, no fever, no purulent drainage, no sinus tenderness. ?No GERD symptoms with use of nexium. ?No improvement with montelukast, OTC antihistamine (zyrtec, xyzal, claritin, allegra), dymista, and eye allergy relief drops ?Allergy test completed several years ago per patient (negative). ?Hx of dry eyes, current use of systane, unable to afford restasis ?Minimal improvement with oral abx: augmentin 62month ago. ? ?Entered referal to allergist ?Maintain current medications ? ?  ?  ? Relevant Orders  ? Ambulatory referral to Allergy  ? Mild intermittent asthma without complication  ?  Controlled with advair daily and albuterol prn ? ?  ?  ? Relevant Orders  ? Ambulatory referral to Allergy  ?  ? Digestive  ? GERD  ?  Stable with nexium ? ?  ?  ?  ? Other  ? Dysuria  ?  Onset 1week ago. Associated with vaginal odor. ?She is not sexually active. ?S/p hysterectomy and oophrectomy ?Hx of urinary  incontinence, followed by [facility], current use of mybertrig, cranbery tabs and D-mannose. ?Treated for UTI 139monthgo per patient. ?No constipation, no diarrhea. ? ?Possible due to vaginal atrophy? ?Check urina

## 2022-02-25 LAB — CERVICOVAGINAL ANCILLARY ONLY
Bacterial Vaginitis (gardnerella): NEGATIVE
Candida Glabrata: NEGATIVE
Candida Vaginitis: NEGATIVE
Comment: NEGATIVE
Comment: NEGATIVE
Comment: NEGATIVE

## 2022-02-25 MED ORDER — NITROFURANTOIN MONOHYD MACRO 100 MG PO CAPS: 100.0000 mg | ORAL_CAPSULE | Freq: Two times a day (BID) | ORAL | 0 refills | Status: DC

## 2022-02-26 ENCOUNTER — Telehealth: Payer: Self-pay | Admitting: Nurse Practitioner

## 2022-02-26 LAB — URINE CULTURE
MICRO NUMBER:: 13307038
SPECIMEN QUALITY:: ADEQUATE

## 2022-02-26 LAB — URINALYSIS W MICROSCOPIC + REFLEX CULTURE
Bilirubin Urine: NEGATIVE
Glucose, UA: NEGATIVE
Hgb urine dipstick: NEGATIVE
Hyaline Cast: NONE SEEN /LPF
Ketones, ur: NEGATIVE
Nitrites, Initial: POSITIVE — AB
Protein, ur: NEGATIVE
Specific Gravity, Urine: 1.015 (ref 1.001–1.035)
WBC, UA: >=60 /HPF — AB (ref 0–5)
pH: 6.5 (ref 5.0–8.0)

## 2022-02-26 LAB — CULTURE INDICATED

## 2022-02-26 MED ORDER — NITROFURANTOIN MONOHYD MACRO 100 MG PO CAPS: 100.0000 mg | ORAL_CAPSULE | Freq: Two times a day (BID) | ORAL | 0 refills | Status: DC

## 2022-02-26 NOTE — Telephone Encounter (Signed)
error 

## 2022-02-26 NOTE — Addendum Note (Signed)
Addended by: Leana Gamer on: 02/26/2022 01:34 PM ? ? Modules accepted: Orders ? ?

## 2022-02-27 ENCOUNTER — Ambulatory Visit (INDEPENDENT_AMBULATORY_CARE_PROVIDER_SITE_OTHER): Payer: Medicare PPO | Admitting: Nurse Practitioner

## 2022-02-27 ENCOUNTER — Encounter: Payer: Self-pay | Admitting: Nurse Practitioner

## 2022-02-27 VITALS — BP 144/80 | HR 68 | Temp 96.3°F | Wt 127.0 lb

## 2022-02-27 DIAGNOSIS — M549 Dorsalgia, unspecified: Secondary | ICD-10-CM | POA: Diagnosis not present

## 2022-02-27 DIAGNOSIS — N3000 Acute cystitis without hematuria: Secondary | ICD-10-CM | POA: Diagnosis not present

## 2022-02-27 MED ORDER — PHENAZOPYRIDINE HCL 100 MG PO TABS: 100.0000 mg | ORAL_TABLET | Freq: Three times a day (TID) | ORAL | 0 refills | Status: DC | PRN

## 2022-02-27 MED ORDER — TIZANIDINE HCL 4 MG PO TABS: 4.0000 mg | ORAL_TABLET | Freq: Four times a day (QID) | ORAL | 0 refills | Status: DC | PRN

## 2022-02-27 NOTE — Progress Notes (Signed)
? ?Acute Office Visit ? ?Subjective:  ? ?  ?Patient ID: Patricia Peck, female    DOB: June 22, 1946, 76 y.o.   MRN: [id number] ? ?Chief Complaint  ?Patient presents with  ? Follow-up  ?  UTI f/u w/ pain in lf collarbone x 2days   ? ? ?HPI ?Patient is in today for ongoing urinary burning and UTI symptoms. She was prescribed macrobid twice a day for 5 days. The foul odor of the urine disappeared. She has been taking a cranberry supplement. Denies fevers, nausea, and vomiting.  She states that she received a phone call about her results, however she was never able to get back in contact with somebody and wanted to make sure she was on the correct antibiotic. ? ?She has been having upper left sided intermittent back pain. She describes it as sharp and moving makes it worse. She has not taking anything for the pain. Denies any injury.  She states that when she is not moving, she is not having much pain, just tightness.  The pain does not radiate. ? ?ROS ?See pertinent positives and negatives per HPI. ? ?   ?Objective:  ?  ?BP (!) 144/80 (BP Location: Right Arm, Patient Position: Sitting, Cuff Size: Normal)   Pulse 68   Temp (!) 96.3 ?F (35.7 ?C) (Temporal)   Wt 127 lb (57.6 kg)   SpO2 97%   BMI 21.80 kg/m?  ? ? ?Physical Exam ?Vitals and nursing note reviewed.  ?Constitutional:   ?   General: She is not in acute distress. ?   Appearance: Normal appearance.  ?HENT:  ?   Head: Normocephalic.  ?Eyes:  ?   Conjunctiva/sclera: Conjunctivae normal.  ?Cardiovascular:  ?   Rate and Rhythm: Normal rate and regular rhythm.  ?   Pulses: Normal pulses.  ?   Heart sounds: Normal heart sounds.  ?Pulmonary:  ?   Effort: Pulmonary effort is normal.  ?   Breath sounds: Normal breath sounds.  ?Abdominal:  ?   Palpations: Abdomen is soft.  ?   Tenderness: There is no abdominal tenderness. There is no right CVA tenderness or left CVA tenderness.  ?Musculoskeletal:     ?   General: No swelling or tenderness. Normal range of motion.  ?    Cervical back: Normal range of motion. No tenderness.  ?Lymphadenopathy:  ?   Cervical: No cervical adenopathy.  ?Skin: ?   General: Skin is warm.  ?Neurological:  ?   General: No focal deficit present.  ?   Mental Status: She is alert and oriented to person, place, and time.  ?Psychiatric:     ?   Mood and Affect: Mood normal.     ?   Behavior: Behavior normal.     ?   Thought Content: Thought content normal.     ?   Judgment: Judgment normal.  ? ? ?No results found for any visits on 02/27/22. ? ?   ?Assessment & Plan:  ? ?Problem List Items Addressed This Visit   ?None ?Visit Diagnoses   ? ? Acute cystitis without hematuria    -  Primary  ? Urine culture reviewed, shows that it is sensitive to Macrobid.  Finish antibiotic.  We will send in Pyridium as needed for burning.  Drink plenty of fluids.  ? Upper back pain      ? Stretches given to do daily.  We will give tizanidine 4 mg as needed for muscle tightness and spasms.  Can  take Tylenol as needed.  ? Relevant Medications  ? tiZANidine (ZANAFLEX) 4 MG tablet  ? ?  ? ? ?Meds ordered this encounter  ?Medications  ? phenazopyridine (PYRIDIUM) 100 MG tablet  ?  Sig: Take 1 tablet (100 mg total) by mouth 3 (three) times daily as needed for pain.  ?  Dispense:  10 tablet  ?  Refill:  0  ? tiZANidine (ZANAFLEX) 4 MG tablet  ?  Sig: Take 1 tablet (4 mg total) by mouth every 6 (six) hours as needed for muscle spasms.  ?  Dispense:  30 tablet  ?  Refill:  0  ? ? ?Return if symptoms worsen or fail to improve. ? ?Charyl Dancer, NP ? ? ?

## 2022-02-27 NOTE — Patient Instructions (Signed)
It was great to see you! ? ?Start tizanidine as needed for muscle tightness/spasms. Also start stretching daily. ? ?You can start pyridium as needed for urinary burning. This may turn your urine/tears red/orange. Make sure you are drinking plenty of fluids. Continue your macrobid until it's gone.  ? ?Let's follow-up if your symptoms worsen or don't improve.  ? ?Take care, ? ?[name], NP ? ?

## 2022-03-03 ENCOUNTER — Telehealth: Payer: Self-pay | Admitting: Cardiology

## 2022-03-03 DIAGNOSIS — R609 Edema, unspecified: Secondary | ICD-10-CM

## 2022-03-03 DIAGNOSIS — N952 Postmenopausal atrophic vaginitis: Secondary | ICD-10-CM | POA: Diagnosis not present

## 2022-03-03 DIAGNOSIS — N3946 Mixed incontinence: Secondary | ICD-10-CM | POA: Diagnosis not present

## 2022-03-03 DIAGNOSIS — N3021 Other chronic cystitis with hematuria: Secondary | ICD-10-CM | POA: Diagnosis not present

## 2022-03-03 NOTE — Telephone Encounter (Signed)
Returned call to pt she states that her feet and legs are swelling this weekend. She states that she has been DOE but not SOB while inactive. She does mention that she has been told by her PCP that she has been in [facility] for several of months now.Her weight yesterday was 120 and now today is 222. She does take bumex '1mg'$  BID. Her creatinine 0.93. ok to add or increase medication? ?

## 2022-03-03 NOTE — Telephone Encounter (Signed)
Spoke with pt, Aware of dr crenshaw's recommendations.  Lab orders mailed to the pt  

## 2022-03-03 NOTE — Telephone Encounter (Signed)
[name] swelling: STAT is pt has developed SOB within 24 hours ? ?If swelling, where is the swelling located? Both legs and feet ? ?How much weight have you gained and in what time span? NA ? ?Have you gained 3 pounds in a day or 5 pounds in a week? possibly ? ?Do you have a log of your daily weights (if so, list)? 122 lbs ? ?Are you currently taking a fluid pill? Yes ? ?Are you currently SOB? No not at the moment but has experienced ? ?Have you traveled recently? No ? ?

## 2022-03-04 ENCOUNTER — Telehealth: Payer: Self-pay

## 2022-03-05 NOTE — Telephone Encounter (Signed)
A user error has taken place: encounter opened in error, closed for administrative reasons.

## 2022-03-10 ENCOUNTER — Ambulatory Visit: Payer: Medicare PPO | Admitting: Nurse Practitioner

## 2022-03-10 DIAGNOSIS — R609 Edema, unspecified: Secondary | ICD-10-CM | POA: Diagnosis not present

## 2022-03-11 ENCOUNTER — Other Ambulatory Visit: Payer: Self-pay | Admitting: *Deleted

## 2022-03-11 ENCOUNTER — Telehealth: Payer: Self-pay | Admitting: Cardiology

## 2022-03-11 DIAGNOSIS — I10 Essential (primary) hypertension: Secondary | ICD-10-CM

## 2022-03-11 DIAGNOSIS — E876 Hypokalemia: Secondary | ICD-10-CM

## 2022-03-11 LAB — BASIC METABOLIC PANEL
BUN/Creatinine Ratio: 13 (ref 12–28)
BUN: 12 mg/dL (ref 8–27)
CO2: 25 mmol/L (ref 20–29)
Calcium: 9.3 mg/dL (ref 8.7–10.3)
Chloride: 103 mmol/L (ref 96–106)
Creatinine, Ser: 0.93 mg/dL (ref 0.57–1.00)
Glucose: 79 mg/dL (ref 70–99)
Potassium: 3.4 mmol/L — ABNORMAL LOW (ref 3.5–5.2)
Sodium: 141 mmol/L (ref 134–144)
eGFR: 64 mL/min/{1.73_m2} (ref >59)

## 2022-03-11 MED ORDER — POTASSIUM CHLORIDE ER 10 MEQ PO TBCR: 20.0000 meq | EXTENDED_RELEASE_TABLET | Freq: Two times a day (BID) | ORAL | 1 refills | Status: DC

## 2022-03-11 NOTE — Telephone Encounter (Signed)
Spoke with pt, Aware of dr crenshaw's recommendations.  °

## 2022-03-11 NOTE — Telephone Encounter (Signed)
Pt would like for nurse to call back regarding test results. Pt states that she has a few questions. Please advise ?

## 2022-03-11 NOTE — Telephone Encounter (Signed)
Patricia Perla, MD  ?03/11/2022  7:23 AM EDT   ?  ?Change kdur to 20 meq BID; bmet one week ?Patricia Peck  ? ?Patient aware of MD results, recommendations. Notified her Patricia Blades RN will mail the lab slip to be done at [facility] in 1 week ? ?She reports continued slight swelling, L>R.  ?She reports her dyspnea on exertion is the same, not improved ?She reports monitoring salt/sodium in diet/beverages ?She elevates legs - no compressions (never worn) ?She asked what would be causing LE edema ?-- last echo was 2022 - LVEF NL, diastolic parameters indeterminate ?-- she reports no varicosities in LE ? ?Advised will send to Dr. Stanford Breed to review ? ?

## 2022-03-21 DIAGNOSIS — E876 Hypokalemia: Secondary | ICD-10-CM | POA: Diagnosis not present

## 2022-03-21 LAB — BASIC METABOLIC PANEL
BUN/Creatinine Ratio: 13 (ref 12–28)
BUN: 13 mg/dL (ref 8–27)
CO2: 25 mmol/L (ref 20–29)
Calcium: 9.1 mg/dL (ref 8.7–10.3)
Chloride: 99 mmol/L (ref 96–106)
Creatinine, Ser: 0.97 mg/dL (ref 0.57–1.00)
Glucose: 75 mg/dL (ref 70–99)
Potassium: 3.8 mmol/L (ref 3.5–5.2)
Sodium: 138 mmol/L (ref 134–144)
eGFR: 61 mL/min/{1.73_m2} (ref >59)

## 2022-04-07 ENCOUNTER — Other Ambulatory Visit: Payer: Self-pay

## 2022-04-07 DIAGNOSIS — I480 Paroxysmal atrial fibrillation: Secondary | ICD-10-CM

## 2022-04-07 MED ORDER — METOPROLOL SUCCINATE ER 50 MG PO TB24: 50.0000 mg | ORAL_TABLET | Freq: Every day | ORAL | 3 refills | Status: DC

## 2022-04-08 ENCOUNTER — Other Ambulatory Visit: Payer: Self-pay

## 2022-04-08 DIAGNOSIS — E7800 Pure hypercholesterolemia, unspecified: Secondary | ICD-10-CM

## 2022-04-08 MED ORDER — ATORVASTATIN CALCIUM 80 MG PO TABS: 80.0000 mg | ORAL_TABLET | Freq: Every day | ORAL | 3 refills | Status: DC

## 2022-04-10 ENCOUNTER — Telehealth: Payer: Medicare PPO

## 2022-04-17 NOTE — Progress Notes (Deleted)
Cardiology Clinic Note   Patient Name: Patricia Peck Date of Encounter: 04/17/2022  Primary Care Provider:  Flossie Buffy, NP Primary Cardiologist:  Kirk Ruths, MD  Patient Profile    76 year old female with history of atrial fibrillation, (on Xarelto CHADS VASC Score of 6), TIA with brain MRI 11/2012 revealing no acute infarct but mild small vessel disease.  No PFO was identified on echocardiogram 12/2012 carotid Dopplers 08/23/2019 revealed 1 to 39% bilateral stenosis, left subclavian stenosis was noted.  Cardiac CTA in October 2020 revealed a calcium score of 7 but no obstructive coronary artery disease.  Most recent echocardiogram February 2022 revealed normal LV function with mild aortic insufficiency.  Other history includes hypertension, hyperlipidemia hypothyroidism  Past Medical History    Past Medical History:  Diagnosis Date   Allergy    Arthritis    hands   Asthma    uses an inhaler   Atrial fibrillation (HCC)    Atrial flutter (HCC)    Depression    Dysrhythmia    a-fib, on Xarelto   GERD (gastroesophageal reflux disease)    Hyperlipidemia    Hypertension    Hypothyroidism    Osteoporosis    Stroke ([place]) 2017   TIA   TIA (transient ischemic attack)    Past Surgical History:  Procedure Laterality Date   ABDOMINAL HYSTERECTOMY     COLONOSCOPY     MYRINGOTOMY WITH TUBE PLACEMENT Right 01/14/2016   Procedure: RIGHT MYRINGOTOMY WITH T-TUBE PLACEMENT;  Surgeon: Leta Baptist, MD;  Location: [facility];  Service: ENT;  Laterality: Right;   PARTIAL HYSTERECTOMY  1989   ROBOTIC ASSISTED BILATERAL SALPINGO OOPHERECTOMY Bilateral 02/14/2021   Procedure: XI ROBOTIC ASSISTED BILATERAL SALPINGO OOPHORECTOMY;  Surgeon: Everitt Amber, MD;  Location: [facility];  Service: Gynecology;  Laterality: Bilateral;   TEE WITHOUT CARDIOVERSION N/A 12/22/2012   Procedure: TRANSESOPHAGEAL ECHOCARDIOGRAM (TEE);  Surgeon: Lelon Perla, MD;  Location: [facility]COPY;  Service:  Cardiovascular;  Laterality: N/A;    Allergies  No Known Allergies  History of Present Illness    Mrs. Hobbins presents today for ongoing assessment and management of persistent atrial fibrillation, hypertension, history of TIA, hyperlipidemia, mild aortic insufficiency, and left subclavian stenosis.  Home Medications    Current Outpatient Medications  Medication Sig Dispense Refill   ADVAIR DISKUS 100-50 MCG/DOSE AEPB Inhale 1 puff into the lungs in the morning and at bedtime. Maintain upcoming appt for additional refill 60 each 0   albuterol (VENTOLIN HFA) 108 (90 Base) MCG/ACT inhaler Inhale 1 puff into the lungs every 6 (six) hours as needed.     Alpha-D-Galactosidase (BEANO PO) Take 1 tablet by mouth 2 (two) times daily.     atorvastatin (LIPITOR) 80 MG tablet Take 1 tablet (80 mg total) by mouth daily. 90 tablet 3   Azelastine-Fluticasone 137-50 MCG/ACT SUSP Place 1 spray into both nostrils daily.     bumetanide (BUMEX) 1 MG tablet TAKE 1 TABLET(1 MG) BY MOUTH TWICE DAILY 180 tablet 1   Calcium Carbonate-Vitamin D 600-400 MG-UNIT tablet Take 1 tablet by mouth 2 (two) times daily.     Cholecalciferol (VITAMIN D-3) 5000 UNITS TABS Take 5,000 Units by mouth 3 (three) times a week. Tuesday Thursday Saturday     Cranberry 450 MG CAPS Take 2 capsules by mouth daily.     denosumab (PROLIA) 60 MG/ML SOLN injection Inject 60 mg into the skin once. Administer in upper arm, thigh, or abdomen (Patient taking differently: Inject 60  mg into the skin every 6 (six) months. Administer in upper arm, thigh, or abdomen) 180 mL 0   esomeprazole (NEXIUM) 40 MG capsule Take 1 capsule (40 mg total) by mouth daily at 12 noon. 90 capsule 1   estradiol (ESTRACE) 0.1 MG/GM vaginal cream Place 1 Applicatorful vaginally once a week.     ezetimibe (ZETIA) 10 MG tablet Take 1 tablet (10 mg total) by mouth daily. 90 tablet 3   levocetirizine (XYZAL) 5 MG tablet Take 1 tablet (5 mg total) by mouth daily. 90 tablet 1    levothyroxine (SYNTHROID) 50 MCG tablet Take 1 tablet (50 mcg total) by mouth daily. 90 tablet 3   metoprolol succinate (TOPROL-XL) 50 MG 24 hr tablet Take 1 tablet (50 mg total) by mouth daily. Maintain upcoming appt for additional refill 90 tablet 3   mirabegron ER (MYRBETRIQ) 50 MG TB24 tablet Take 50 mg by mouth daily.     montelukast (SINGULAIR) 10 MG tablet Take 1 tablet (10 mg total) by mouth at bedtime. 90 tablet 1   naphazoline-pheniramine (EYE ALLERGY RELIEF) 0.025-0.3 % ophthalmic solution Place 1 drop into both eyes daily.     nitrofurantoin, macrocrystal-monohydrate, (MACROBID) 100 MG capsule Take 1 capsule (100 mg total) by mouth 2 (two) times daily. To complete 7days course 4 capsule 0   OVER THE COUNTER MEDICATION Take 2 tablets by mouth daily. Bounty  Hair,Nail, and Skin daily gummies.     phenazopyridine (PYRIDIUM) 100 MG tablet Take 1 tablet (100 mg total) by mouth 3 (three) times daily as needed for pain. 10 tablet 0   potassium chloride (KLOR-CON) 10 MEQ tablet Take 2 tablets (20 mEq total) by mouth 2 (two) times daily. 180 tablet 1   rOPINIRole (REQUIP) 0.25 MG tablet Take 1 tablet (0.25 mg total) by mouth at bedtime. 90 tablet 3   tiZANidine (ZANAFLEX) 4 MG tablet Take 1 tablet (4 mg total) by mouth every 6 (six) hours as needed for muscle spasms. 30 tablet 0   traZODone (DESYREL) 50 MG tablet Take 1 tablet (50 mg total) by mouth at bedtime. 90 tablet 3   vitamin E 180 MG (400 UNITS) capsule Take 400 Units by mouth 3 (three) times a week. Tuesdays, Thursdays, and Saturdays     XARELTO 20 MG TABS tablet TAKE 1 TABLET(20 MG) BY MOUTH DAILY WITH SUPPER 90 tablet 1   No current facility-administered medications for this visit.     Family History    Family History  Problem Relation Age of Onset   Breast cancer Mother    Drug abuse Son    Depression Son    Stroke Paternal Aunt    Heart disease Paternal Grandfather    Diabetes Maternal Grandfather    Colon cancer Neg Hx     Ovarian cancer Neg Hx    Endometrial cancer Neg Hx    Pancreatic cancer Neg Hx    Prostate cancer Neg Hx    She indicated that her mother is deceased. She indicated that her father is alive. She indicated that all of her three sisters are alive. She indicated that her maternal grandfather is deceased. She indicated that her paternal grandfather is deceased. She indicated that her son is alive. She indicated that the status of her paternal aunt is unknown. She indicated that the status of her neg hx is unknown.  Social History    Social History   Socioeconomic History   Marital status: Married    Spouse name: Not on  file   Number of children: 2   Years of education: Not on file   Highest education level: Not on file  Occupational History    Employer: RETIRED  Tobacco Use   Smoking status: Never   Smokeless tobacco: Never  Vaping Use   Vaping Use: Never used  Substance and Sexual Activity   Alcohol use: Yes    Comment: Rare   Drug use: No   Sexual activity: Not Currently  Other Topics Concern   Not on file  Social History Narrative   Does have HPOA- sons Eddie Dibbles and Wille Glaser.   Does desire life support if not futile.   Not sure about feeding tubes.   Social Determinants of Health   Financial Resource Strain: Low Risk  (10/31/2021)   Overall Financial Resource Strain (CARDIA)    Difficulty of Paying Living Expenses: Not hard at all  Food Insecurity: No Food Insecurity (10/09/2021)   Hunger Vital Sign    Worried About Running Out of Food in the Last Year: Never true    Ran Out of Food in the Last Year: Never true  Transportation Needs: No Transportation Needs (10/09/2021)   PRAPARE - Hydrologist (Medical): No    Lack of Transportation (Non-Medical): No  Physical Activity: Inactive (10/09/2021)   Exercise Vital Sign    Days of Exercise per Week: 0 days    Minutes of Exercise per Session: 0 min  Stress: No Stress Concern Present (10/09/2021)    [place]    Feeling of Stress : Not at all  Social Connections: Moderately Isolated (10/09/2021)   Social Connection and Isolation Panel [NHANES]    Frequency of Communication with Friends and Family: Twice a week    Frequency of Social Gatherings with Friends and Family: Twice a week    Attends Religious Services: Never    Marine scientist or Organizations: No    Attends Archivist Meetings: Never    Marital Status: Married  Human resources officer Violence: Not At Risk (10/09/2021)   Humiliation, Afraid, Rape, and Kick questionnaire    Fear of Current or Ex-Partner: No    Emotionally Abused: No    Physically Abused: No    Sexually Abused: No     Review of Systems    General:  No chills, fever, night sweats or weight changes.  Cardiovascular:  No chest pain, dyspnea on exertion, edema, orthopnea, palpitations, paroxysmal nocturnal dyspnea. Dermatological: No rash, lesions/masses Respiratory: No cough, dyspnea Urologic: No hematuria, dysuria Abdominal:   No nausea, vomiting, diarrhea, bright red blood per rectum, melena, or hematemesis Neurologic:  No visual changes, wkns, changes in mental status. All other systems reviewed and are otherwise negative except as noted above.     Physical Exam    VS:  There were no vitals taken for this visit. , BMI There is no height or weight on file to calculate BMI.     GEN: Well nourished, well developed, in no acute distress. HEENT: normal. Neck: Supple, no JVD, carotid bruits, or masses. Cardiac: RRR, no murmurs, rubs, or gallops. No clubbing, cyanosis, edema.  Radials/DP/PT 2+ and equal bilaterally.  Respiratory:  Respirations regular and unlabored, clear to auscultation bilaterally. GI: Soft, nontender, nondistended, BS + x 4. MS: no deformity or atrophy. Skin: warm and dry, no rash. Neuro:  Strength and sensation are intact. Psych: Normal  affect.  Accessory Clinical Findings    ECG personally  reviewed by me today- *** - No acute changes  Lab Results  Component Value Date   WBC 7.8 10/18/2021   HGB 13.2 10/18/2021   HCT 40.4 10/18/2021   MCV 90 10/18/2021   PLT 328 10/18/2021   Lab Results  Component Value Date   CREATININE 0.97 03/21/2022   BUN 13 03/21/2022   NA 138 03/21/2022   K 3.8 03/21/2022   CL 99 03/21/2022   CO2 25 03/21/2022   Lab Results  Component Value Date   ALT 17 10/18/2021   AST 18 10/18/2021   ALKPHOS 80 10/18/2021   BILITOT 0.6 10/18/2021   Lab Results  Component Value Date   CHOL 107 07/19/2021   HDL 42.00 07/19/2021   LDLCALC 44 07/19/2021   LDLDIRECT 129.2 05/28/2007   TRIG 103.0 07/19/2021   CHOLHDL 3 07/19/2021    Lab Results  Component Value Date   HGBA1C 5.7 09/07/2017    Review of Prior Studies: Echocardiogram 12/07/2020 1. Left ventricular ejection fraction, by estimation, is 55 to 60%. The  left ventricle has normal function. The left ventricle has no regional  wall motion abnormalities. Left ventricular diastolic parameters are  indeterminate.   2. Right ventricular systolic function is normal. The right ventricular  size is normal.   3. The mitral valve is grossly normal. Trivial mitral valve  regurgitation.   4. The aortic valve is tricuspid. Aortic valve regurgitation is mild. No  aortic stenosis is present.   5. There is borderline dilatation of the ascending aorta, measuring 37  mm.   6. The inferior vena cava is normal in size with greater than 50%  respiratory variability, suggesting right atrial pressure of 3 mmHg.   Coronary CTA 08/26/2019 . Calcium score 7 isolated to proximal RCA this is 34 th percentile for age and sex   2.  Upper limits of normal aortic root diameter 3.7 cm   3.  No obstructive CAD see description above CAD-RADS 1  Carotid Doppler Study 08/17/2019 Right Carotid: Velocities in the right ICA are consistent with a 1-39%   stenosis.                 Non-hemodynamically significant plaque <50% noted in the  CCA. The                 RICA velocities remain within normal range and have  decreased                 compared to prior exam.   Left Carotid: Velocities in the left ICA are consistent with a 1-39%  stenosis.                The LICA velocities remain within normal range and have  decreased                compared to prior exam.   Vertebrals:  Left vertebral artery demonstrates antegrade flow. Atypical               antegrade flow in the left vertebral artery.  Subclavians: Left subclavian artery flow was disturbed. Probable high  grade               stenosis vs occlusion more proximally. Normal flow  hemodynamics               were seen in the right subclavian artery.   Normal triphasic flow noted in the right brachial artery; monophasic flow  noted in the left. There is  a 18 mmHg pressure difference between the  arms, left being the lowest.   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   Assessment & Plan   1.  ***    Current medicines are reviewed at length with the patient today.  I have spent *** min's  dedicated to the care of this patient on the date of this encounter to include pre-visit review of records, assessment, management and diagnostic testing,with shared decision making. Signed, Phill Myron. West Pugh, ANP, AACC   04/17/2022 2:59 PM    [place] Office [phone] Fax [phone]  Notice: This dictation was prepared with Dragon dictation along with smaller phrase technology. Any transcriptional errors that result from this process are unintentional and may not be corrected upon review.

## 2022-04-18 ENCOUNTER — Ambulatory Visit: Payer: Medicare PPO | Admitting: Adult Health

## 2022-04-21 ENCOUNTER — Ambulatory Visit: Payer: Medicare PPO | Admitting: Cardiology

## 2022-04-23 ENCOUNTER — Encounter: Payer: Self-pay | Admitting: Adult Health

## 2022-05-12 ENCOUNTER — Other Ambulatory Visit: Payer: Self-pay | Admitting: Family Medicine

## 2022-05-12 DIAGNOSIS — Z1231 Encounter for screening mammogram for malignant neoplasm of breast: Secondary | ICD-10-CM

## 2022-05-15 ENCOUNTER — Ambulatory Visit: Payer: Medicare PPO

## 2022-05-22 ENCOUNTER — Telehealth: Payer: Self-pay | Admitting: Nurse Practitioner

## 2022-05-22 DIAGNOSIS — K219 Gastro-esophageal reflux disease without esophagitis: Secondary | ICD-10-CM

## 2022-05-22 MED ORDER — ESOMEPRAZOLE MAGNESIUM 40 MG PO CPDR: 40.0000 mg | DELAYED_RELEASE_CAPSULE | Freq: Every day | ORAL | 1 refills | Status: DC

## 2022-05-22 NOTE — Telephone Encounter (Signed)
Chart supports Rx Last OV: 02/2022 Next OV: 07/2022

## 2022-05-22 NOTE — Telephone Encounter (Signed)
Pt has requested a refill on her Nexium. She uses [facility] in [place]

## 2022-05-26 ENCOUNTER — Telehealth: Payer: Self-pay | Admitting: Nurse Practitioner

## 2022-05-26 DIAGNOSIS — K219 Gastro-esophageal reflux disease without esophagitis: Secondary | ICD-10-CM

## 2022-05-26 NOTE — Telephone Encounter (Signed)
Caller Name: [facility]  Call back phone #: [phone]  MEDICATION(S): esomeprazole (NEXIUM) 40 MG capsule  Days of Med Remaining:   Has the patient contacted their pharmacy (YES/NO)? Y What did pharmacy advise?  Preferred Pharmacy:  [id number] - [place], [address] AT [facility] [address] Phone:  [phone]      ~~~Please advise patient/caregiver to allow 2-3 business days to process RX refills.    Pt stated that she can not use the generic medication that was ordered

## 2022-05-27 MED ORDER — NEXIUM 40 MG PO CPDR: 40.0000 mg | DELAYED_RELEASE_CAPSULE | Freq: Every day | ORAL | 1 refills | Status: DC

## 2022-05-27 NOTE — Telephone Encounter (Signed)
Noted  

## 2022-05-29 ENCOUNTER — Ambulatory Visit: Payer: Medicare PPO

## 2022-05-29 ENCOUNTER — Encounter: Payer: Self-pay | Admitting: Family Medicine

## 2022-05-29 ENCOUNTER — Ambulatory Visit (INDEPENDENT_AMBULATORY_CARE_PROVIDER_SITE_OTHER): Payer: Medicare PPO

## 2022-05-29 ENCOUNTER — Ambulatory Visit (INDEPENDENT_AMBULATORY_CARE_PROVIDER_SITE_OTHER): Payer: Medicare PPO | Admitting: Family Medicine

## 2022-05-29 VITALS — BP 130/80 | HR 71 | Temp 97.4°F | Wt 121.0 lb

## 2022-05-29 DIAGNOSIS — M546 Pain in thoracic spine: Secondary | ICD-10-CM | POA: Diagnosis not present

## 2022-05-29 DIAGNOSIS — S299XXA Unspecified injury of thorax, initial encounter: Secondary | ICD-10-CM | POA: Diagnosis not present

## 2022-05-29 DIAGNOSIS — W19XXXA Unspecified fall, initial encounter: Secondary | ICD-10-CM | POA: Insufficient documentation

## 2022-05-29 DIAGNOSIS — M47814 Spondylosis without myelopathy or radiculopathy, thoracic region: Secondary | ICD-10-CM | POA: Diagnosis not present

## 2022-05-29 MED ORDER — ACETAMINOPHEN ER 650 MG PO TBCR: 650.0000 mg | EXTENDED_RELEASE_TABLET | Freq: Three times a day (TID) | ORAL | 0 refills | Status: AC

## 2022-05-29 NOTE — Progress Notes (Unsigned)
Initial visit. mid back pain after a fall 05/28/22.

## 2022-05-29 NOTE — Progress Notes (Addendum)
Assessment/Plan:   Problem List Items Addressed This Visit       Other   Fall - Primary   Relevant Medications   acetaminophen (TYLENOL 8 HOUR) 650 MG CR tablet   Other Relevant Orders   DG Thoracic Spine W/Swimmers   Other Visit Diagnoses     Thoracic spine pain       Relevant Medications   acetaminophen (TYLENOL 8 HOUR) 650 MG CR tablet       Tenderness over thoracic back slightly left of midline Given age and overall small body mass, concern for possible fracture Thoracic x-ray obtained Tylenol for pain No gross fracture noted on xray, overread pending Pending results, recommend referral to orthopedics if found to have fracture Return precautions discussed   Subjective:  HPI:  Patricia Peck is a 76 y.o. female who has Hypothyroidism; Depression; Essential hypertension; Allergic rhinitis; GERD; Osteoporosis; Insomnia; PAF (paroxysmal atrial fibrillation) ([place]); Cerebrovascular disease; IBS (irritable bowel syndrome); Chronic allergic conjunctivitis; Eustachian tube disorder; Situational anxiety; Hypercholesterolemia; History of TIA (transient ischemic attack); Anticoagulated; Stenosis of left subclavian artery ([place]); Left arm pain; Right ovarian cyst; Cystitis; Mild intermittent asthma without complication; Vasomotor rhinitis; Dysuria; and Fall on their problem list..   She  has a past medical history of Allergy, Arthritis, Asthma, Atrial fibrillation ([place]), Atrial flutter ([place]), Depression, Dysrhythmia, GERD (gastroesophageal reflux disease), Hyperlipidemia, Hypertension, Hypothyroidism, Osteoporosis, Stroke ([place]) (2017), and TIA (transient ischemic attack)..   She presents with chief complaint of Fall (Patient fell yesterday and injured her mid back. ) .   Patient presents to clinic today with left thoracic back pain that started this morning.  Patient was returning from [employer] and fell in the doorway.  Patient felt that she slipped.  She did not  have any syncope, chest pain, shortness of breath leading up to the fall.  Since the fall, patient has had slightly worsening pain.  It is difficult to breathe due to pain.  Patient has tried topical Voltaren gel for pain, this has not helped very much.   Patient does take anticoagulation for atrial fibrillation.  On Xarelto.  She is not taking any NSAIDs.  Past Surgical History:  Procedure Laterality Date   ABDOMINAL HYSTERECTOMY     COLONOSCOPY     MYRINGOTOMY WITH TUBE PLACEMENT Right 01/14/2016   Procedure: RIGHT MYRINGOTOMY WITH T-TUBE PLACEMENT;  Surgeon: Leta Baptist, MD;  Location: [facility];  Service: ENT;  Laterality: Right;   PARTIAL HYSTERECTOMY  1989   ROBOTIC ASSISTED BILATERAL SALPINGO OOPHERECTOMY Bilateral 02/14/2021   Procedure: XI ROBOTIC ASSISTED BILATERAL SALPINGO OOPHORECTOMY;  Surgeon: Everitt Amber, MD;  Location: [facility];  Service: Gynecology;  Laterality: Bilateral;   TEE WITHOUT CARDIOVERSION N/A 12/22/2012   Procedure: TRANSESOPHAGEAL ECHOCARDIOGRAM (TEE);  Surgeon: Lelon Perla, MD;  Location: [facility] ENDOSCOPY;  Service: Cardiovascular;  Laterality: N/A;    Outpatient Medications Prior to Visit  Medication Sig Dispense Refill   ADVAIR DISKUS 100-50 MCG/DOSE AEPB Inhale 1 puff into the lungs in the morning and at bedtime. Maintain upcoming appt for additional refill 60 each 0   albuterol (VENTOLIN HFA) 108 (90 Base) MCG/ACT inhaler Inhale 1 puff into the lungs every 6 (six) hours as needed.     Alpha-D-Galactosidase (BEANO PO) Take 1 tablet by mouth 2 (two) times daily.     atorvastatin (LIPITOR) 80 MG tablet Take 1 tablet (80 mg total) by mouth daily. 90 tablet 3   Azelastine-Fluticasone 137-50 MCG/ACT SUSP Place 1 spray  into both nostrils daily.     bumetanide (BUMEX) 1 MG tablet TAKE 1 TABLET(1 MG) BY MOUTH TWICE DAILY 180 tablet 1   Calcium Carbonate-Vitamin D 600-400 MG-UNIT tablet Take 1 tablet by mouth 2 (two) times daily.     Cholecalciferol  (VITAMIN D-3) 5000 UNITS TABS Take 5,000 Units by mouth 3 (three) times a week. Tuesday Thursday Saturday     Cranberry 450 MG CAPS Take 2 capsules by mouth daily.     denosumab (PROLIA) 60 MG/ML SOLN injection Inject 60 mg into the skin once. Administer in upper arm, thigh, or abdomen (Patient taking differently: Inject 60 mg into the skin every 6 (six) months. Administer in upper arm, thigh, or abdomen) 180 mL 0   estradiol (ESTRACE) 0.1 MG/GM vaginal cream Place 1 Applicatorful vaginally once a week.     levocetirizine (XYZAL) 5 MG tablet Take 1 tablet (5 mg total) by mouth daily. 90 tablet 1   levothyroxine (SYNTHROID) 50 MCG tablet Take 1 tablet (50 mcg total) by mouth daily. 90 tablet 3   metoprolol succinate (TOPROL-XL) 50 MG 24 hr tablet Take 1 tablet (50 mg total) by mouth daily. Maintain upcoming appt for additional refill 90 tablet 3   montelukast (SINGULAIR) 10 MG tablet Take 1 tablet (10 mg total) by mouth at bedtime. 90 tablet 1   naphazoline-pheniramine (EYE ALLERGY RELIEF) 0.025-0.3 % ophthalmic solution Place 1 drop into both eyes daily.     NEXIUM 40 MG capsule Take 1 capsule (40 mg total) by mouth daily at 12 noon. 90 capsule 1   OVER THE COUNTER MEDICATION Take 2 tablets by mouth daily. Bounty  Hair,Nail, and Skin daily gummies.     phenazopyridine (PYRIDIUM) 100 MG tablet Take 1 tablet (100 mg total) by mouth 3 (three) times daily as needed for pain. 10 tablet 0   potassium chloride (KLOR-CON) 10 MEQ tablet Take 2 tablets (20 mEq total) by mouth 2 (two) times daily. 180 tablet 1   rOPINIRole (REQUIP) 0.25 MG tablet Take 1 tablet (0.25 mg total) by mouth at bedtime. 90 tablet 3   traZODone (DESYREL) 50 MG tablet Take 1 tablet (50 mg total) by mouth at bedtime. 90 tablet 3   vitamin E 180 MG (400 UNITS) capsule Take 400 Units by mouth 3 (three) times a week. Tuesdays, Thursdays, and Saturdays     XARELTO 20 MG TABS tablet TAKE 1 TABLET(20 MG) BY MOUTH DAILY WITH SUPPER 90 tablet 1    ezetimibe (ZETIA) 10 MG tablet Take 1 tablet (10 mg total) by mouth daily. 90 tablet 3   mirabegron ER (MYRBETRIQ) 50 MG TB24 tablet Take 50 mg by mouth daily. (Patient not taking: Reported on 05/29/2022)     nitrofurantoin, macrocrystal-monohydrate, (MACROBID) 100 MG capsule Take 1 capsule (100 mg total) by mouth 2 (two) times daily. To complete 7days course (Patient not taking: Reported on 05/29/2022) 4 capsule 0   tiZANidine (ZANAFLEX) 4 MG tablet Take 1 tablet (4 mg total) by mouth every 6 (six) hours as needed for muscle spasms. (Patient not taking: Reported on 05/29/2022) 30 tablet 0   No facility-administered medications prior to visit.    Family History  Problem Relation Age of Onset   Breast cancer Mother    Drug abuse Son    Depression Son    Stroke Paternal Aunt    Heart disease Paternal Grandfather    Diabetes Maternal Grandfather    Colon cancer Neg Hx    Ovarian cancer Neg Hx  Endometrial cancer Neg Hx    Pancreatic cancer Neg Hx    Prostate cancer Neg Hx     Social History   Socioeconomic History   Marital status: Married    Spouse name: Not on file   Number of children: 2   Years of education: Not on file   Highest education level: Not on file  Occupational History    Employer: RETIRED  Tobacco Use   Smoking status: Never   Smokeless tobacco: Never  Vaping Use   Vaping Use: Never used  Substance and Sexual Activity   Alcohol use: Yes    Comment: Rare   Drug use: No   Sexual activity: Not Currently  Other Topics Concern   Not on file  Social History Narrative   Does have HPOA- sons Eddie Dibbles and Wille Glaser.   Does desire life support if not futile.   Not sure about feeding tubes.   Social Determinants of Health   Financial Resource Strain: Low Risk  (10/31/2021)   Overall Financial Resource Strain (CARDIA)    Difficulty of Paying Living Expenses: Not hard at all  Food Insecurity: No Food Insecurity (10/09/2021)   Hunger Vital Sign    Worried About Running  Out of Food in the Last Year: Never true    Ran Out of Food in the Last Year: Never true  Transportation Needs: No Transportation Needs (10/09/2021)   PRAPARE - Hydrologist (Medical): No    Lack of Transportation (Non-Medical): No  Physical Activity: Inactive (10/09/2021)   Exercise Vital Sign    Days of Exercise per Week: 0 days    Minutes of Exercise per Session: 0 min  Stress: No Stress Concern Present (10/09/2021)   [place]    Feeling of Stress : Not at all  Social Connections: Moderately Isolated (10/09/2021)   Social Connection and Isolation Panel [NHANES]    Frequency of Communication with Friends and Family: Twice a week    Frequency of Social Gatherings with Friends and Family: Twice a week    Attends Religious Services: Never    Marine scientist or Organizations: No    Attends Archivist Meetings: Never    Marital Status: Married  Human resources officer Violence: Not At Risk (10/09/2021)   Humiliation, Afraid, Rape, and Kick questionnaire    Fear of Current or Ex-Partner: No    Emotionally Abused: No    Physically Abused: No    Sexually Abused: No                                                                                                 Objective:  Physical Exam: BP 130/80 (BP Location: Left Arm, Patient Position: Sitting, Cuff Size: Large)   Pulse 71   Temp (!) 97.4 F (36.3 C) (Temporal)   Wt 121 lb (54.9 kg)   SpO2 96%   BMI 20.77 kg/m    General: No acute distress. Awake and conversant.  Eyes: Normal conjunctiva, anicteric. Round symmetric pupils.  ENT: Hearing grossly intact. No nasal  discharge.  Neck: Neck is supple. No masses or thyromegaly.  Respiratory: Respirations are non-labored. No auditory wheezing.  Skin: Warm. No rashes or ulcers.  Psych: Alert and oriented. Cooperative, Appropriate mood and affect, Normal judgment.  CV: No cyanosis or  JVD MSK: Normal ambulation. No clubbing, normal strength of upper extremities throughout, tenderness left of midline in thoracic back Neuro: Sensation and CN II-XII grossly normal.        Alesia Banda, MD, MS

## 2022-06-11 NOTE — Progress Notes (Signed)
Cardiology Clinic Note   Patient Name: Patricia Peck Date of Encounter: 06/13/2022  Primary Care Provider:  Flossie Buffy, NP Primary Cardiologist:  Kirk Ruths, MD  Patient Profile    76 year old female was h/o paroxysmal atrial fibrillation and TIA. Brain MRI in January 2014 showed no acute infarct, mild small vessel disease. TEE 2/14 showed normal LV function, mild mitral valve prolapse with mild mitral regurgitation,  trace aortic insufficiency. There was no PFO. Monitor in March of 2014 showed paroxysmal atrial fibrillation/flutter. She was therefore started on anticoagulation with Xarelto 20 mg daily.  Cardiac CTA October 2020 showed calcium score 7 but no obstructive coronary disease. Monitor June 2021 showed sinus rhythm with PACs and PAF.  Echocardiogram February 2022 showed normal LV function, mild aortic insufficiency.  Past Medical History    Past Medical History:  Diagnosis Date   Allergy    Arthritis    hands   Asthma    uses an inhaler   Atrial fibrillation (H[facility])    Atrial flutter ([place])    Depression    Dysrhythmia    a-fib, on Xarelto   GERD (gastroesophageal reflux disease)    Hyperlipidemia    Hypertension    Hypothyroidism    Osteoporosis    Stroke ([place]) 2017   TIA   TIA (transient ischemic attack)    Past Surgical History:  Procedure Laterality Date   ABDOMINAL HYSTERECTOMY     COLONOSCOPY     MYRINGOTOMY WITH TUBE PLACEMENT Right 01/14/2016   Procedure: RIGHT MYRINGOTOMY WITH T-TUBE PLACEMENT;  Surgeon: Leta Baptist, MD;  Location: [place];  Service: ENT;  Laterality: Right;   [place] OOPHERECTOMY Bilateral 02/14/2021   Procedure: XI ROBOTIC ASSISTED BILATERAL SALPINGO OOPHORECTOMY;  Surgeon: Everitt Amber, MD;  [facility];  Service: Gynecology;  Laterality: Bilateral;   TEE WITHOUT CARDIOVERSION N/A 12/22/2012   Procedure: TRANSESOPHAGEAL ECHOCARDIOGRAM (TEE);   Surgeon: Lelon Perla, MD;  Location: [facility] ENDOSCOPY;  Service: Cardiovascular;  Laterality: N/A;    Allergies  No Known Allergies  History of Present Illness    Patricia Peck presents today for ongoing assessment and management of, PAF, Hypertension, left subclavian artery stenosis, and hypercholesterolemia.  She is tearful and stressed.  Her husband recently died about 5 weeks ago and she continues to mourn heavily.  She is not sleeping, she is anxious, but does have family support with the son who lives with her.  She denies any chest pain, palpitations, shortness of breath, or dizziness in the midst of her stressful life event.  Blood pressure has been essentially normal at home, sometimes elevated during moments of anxiety and tearfulness.  Home Medications    Current Outpatient Medications  Medication Sig Dispense Refill   zolpidem (AMBIEN) 5 MG tablet Take 1 tablet (5 mg total) by mouth at bedtime as needed for sleep. 10 tablet 0   ADVAIR DISKUS 100-50 MCG/DOSE AEPB Inhale 1 puff into the lungs in the morning and at bedtime. Maintain upcoming appt for additional refill 60 each 0   albuterol (VENTOLIN HFA) 108 (90 Base) MCG/ACT inhaler Inhale 1 puff into the lungs every 6 (six) hours as needed.     Alpha-D-Galactosidase (BEANO PO) Take 1 tablet by mouth 2 (two) times daily.     atorvastatin (LIPITOR) 80 MG tablet Take 1 tablet (80 mg total) by mouth daily. 90 tablet 3   Azelastine-Fluticasone 137-50 MCG/ACT SUSP Place 1 spray  into both nostrils daily.     bumetanide (BUMEX) 1 MG tablet TAKE 1 TABLET(1 MG) BY MOUTH TWICE DAILY 180 tablet 1   Calcium Carbonate-Vitamin D 600-400 MG-UNIT tablet Take 1 tablet by mouth 2 (two) times daily.     Cholecalciferol (VITAMIN D-3) 5000 UNITS TABS Take 5,000 Units by mouth 3 (three) times a week. Tuesday Thursday Saturday     Cranberry 450 MG CAPS Take 2 capsules by mouth daily.     denosumab (PROLIA) 60 MG/ML SOLN injection Inject 60 mg into the skin  once. Administer in upper arm, thigh, or abdomen (Patient taking differently: Inject 60 mg into the skin every 6 (six) months. Administer in upper arm, thigh, or abdomen) 180 mL 0   estradiol (ESTRACE) 0.1 MG/GM vaginal cream Place 1 Applicatorful vaginally once a week.     ezetimibe (ZETIA) 10 MG tablet Take 1 tablet (10 mg total) by mouth daily. 90 tablet 3   levocetirizine (XYZAL) 5 MG tablet Take 1 tablet (5 mg total) by mouth daily. 90 tablet 1   levothyroxine (SYNTHROID) 50 MCG tablet Take 1 tablet (50 mcg total) by mouth daily. 90 tablet 3   metoprolol succinate (TOPROL-XL) 50 MG 24 hr tablet Take 1 tablet (50 mg total) by mouth daily. Maintain upcoming appt for additional refill 90 tablet 3   mirabegron ER (MYRBETRIQ) 50 MG TB24 tablet Take 50 mg by mouth daily. (Patient not taking: Reported on 05/29/2022)     montelukast (SINGULAIR) 10 MG tablet Take 1 tablet (10 mg total) by mouth at bedtime. 90 tablet 1   naphazoline-pheniramine (EYE ALLERGY RELIEF) 0.025-0.3 % ophthalmic solution Place 1 drop into both eyes daily.     NEXIUM 40 MG capsule Take 1 capsule (40 mg total) by mouth daily at 12 noon. 90 capsule 1   nitrofurantoin, macrocrystal-monohydrate, (MACROBID) 100 MG capsule Take 1 capsule (100 mg total) by mouth 2 (two) times daily. To complete 7days course 4 capsule 0   OVER THE COUNTER MEDICATION Take 2 tablets by mouth daily. Bounty  Hair,Nail, and Skin daily gummies.     phenazopyridine (PYRIDIUM) 100 MG tablet Take 1 tablet (100 mg total) by mouth 3 (three) times daily as needed for pain. 10 tablet 0   potassium chloride (KLOR-CON) 10 MEQ tablet Take 2 tablets (20 mEq total) by mouth 2 (two) times daily. 180 tablet 1   rOPINIRole (REQUIP) 0.25 MG tablet Take 1 tablet (0.25 mg total) by mouth at bedtime. 90 tablet 3   tiZANidine (ZANAFLEX) 4 MG tablet Take 1 tablet (4 mg total) by mouth every 6 (six) hours as needed for muscle spasms. (Patient not taking: Reported on 05/29/2022) 30  tablet 0   traZODone (DESYREL) 50 MG tablet Take 1 tablet (50 mg total) by mouth at bedtime. 90 tablet 3   vitamin E 180 MG (400 UNITS) capsule Take 400 Units by mouth 3 (three) times a week. Tuesdays, Thursdays, and Saturdays     XARELTO 20 MG TABS tablet TAKE 1 TABLET(20 MG) BY MOUTH DAILY WITH SUPPER 90 tablet 1   No current facility-administered medications for this visit.     Family History    Family History  Problem Relation Age of Onset   Breast cancer Mother    Drug abuse Son    Depression Son    Stroke Paternal Aunt    Heart disease Paternal Grandfather    Diabetes Maternal Grandfather    Colon cancer Neg Hx    Ovarian cancer Neg  Hx    Endometrial cancer Neg Hx    Pancreatic cancer Neg Hx    Prostate cancer Neg Hx    She indicated that her mother is deceased. She indicated that her father is alive. She indicated that all of her three sisters are alive. She indicated that her maternal grandfather is deceased. She indicated that her paternal grandfather is deceased. She indicated that her son is alive. She indicated that the status of her paternal aunt is unknown. She indicated that the status of her neg hx is unknown.  Social History    Social History   Socioeconomic History   Marital status: Married    Spouse name: Not on file   Number of children: 2   Years of education: Not on file   Highest education level: Not on file  Occupational History    Employer: RETIRED  Tobacco Use   Smoking status: Never   Smokeless tobacco: Never  Vaping Use   Vaping Use: Never used  Substance and Sexual Activity   Alcohol use: Yes    Comment: Rare   Drug use: No   Sexual activity: Not Currently  Other Topics Concern   Not on file  Social History Narrative   Does have HPOA- sons Eddie Dibbles and Wille Glaser.   Does desire life support if not futile.   Not sure about feeding tubes.   Social Determinants of Health   Financial Resource Strain: Low Risk  (10/31/2021)   Overall Financial  Resource Strain (CARDIA)    Difficulty of Paying Living Expenses: Not hard at all  Food Insecurity: No Food Insecurity (10/09/2021)   Hunger Vital Sign    Worried About Running Out of Food in the Last Year: Never true    Ran Out of Food in the Last Year: Never true  Transportation Needs: No Transportation Needs (10/09/2021)   PRAPARE - Hydrologist (Medical): No    Lack of Transportation (Non-Medical): No  Physical Activity: Inactive (10/09/2021)   Exercise Vital Sign    Days of Exercise per Week: 0 days    Minutes of Exercise per Session: 0 min  Stress: No Stress Concern Present (10/09/2021)   [place]    Feeling of Stress : Not at all  Social Connections: Moderately Isolated (10/09/2021)   Social Connection and Isolation Panel [NHANES]    Frequency of Communication with Friends and Family: Twice a week    Frequency of Social Gatherings with Friends and Family: Twice a week    Attends Religious Services: Never    Marine scientist or Organizations: No    Attends Archivist Meetings: Never    Marital Status: Married  Human resources officer Violence: Not At Risk (10/09/2021)   Humiliation, Afraid, Rape, and Kick questionnaire    Fear of Current or Ex-Partner: No    Emotionally Abused: No    Physically Abused: No    Sexually Abused: No     Review of Systems    General:  No chills, fever, night sweats or weight changes.  Cardiovascular:  No chest pain, dyspnea on exertion, edema, orthopnea, palpitations, paroxysmal nocturnal dyspnea. Dermatological: No rash, lesions/masses Respiratory: No cough, dyspnea Urologic: No hematuria, dysuria Abdominal:   No nausea, vomiting, diarrhea, bright red blood per rectum, melena, or hematemesis Neurologic:  No visual changes, wkns, changes in mental status. All other systems reviewed and are otherwise negative except as noted above.  Physical Exam    VS:  BP (!) 142/98   Pulse (!) 54   Ht 5' 4.5" (1.638 m)   Wt 122 lb 3.2 oz (55.4 kg)   SpO2 92%   BMI 20.65 kg/m  , BMI Body mass index is 20.65 kg/m.     GEN: Well nourished, well developed, in no acute distress. HEENT: normal. Neck: Supple, no JVD, carotid bruits, or masses. Cardiac: RRR, no murmurs, rubs, or gallops. No clubbing, cyanosis, edema.  Radials/DP/PT 2+ and equal bilaterally.  Respiratory:  Respirations regular and unlabored, clear to auscultation bilaterally. GI: Soft, nontender, nondistended, BS + x 4. MS: no deformity or atrophy. Skin: warm and dry, no rash. Neuro:  Strength and sensation are intact. Psych: Normal affect.  Accessory Clinical Findings    ECG personally reviewed by me today-sinus bradycardia, with PVCs.  Heart rate of 54 bpm.- No acute changes  Lab Results  Component Value Date   WBC 7.8 10/18/2021   HGB 13.2 10/18/2021   HCT 40.4 10/18/2021   MCV 90 10/18/2021   PLT 328 10/18/2021   Lab Results  Component Value Date   CREATININE 0.97 03/21/2022   BUN 13 03/21/2022   NA 138 03/21/2022   K 3.8 03/21/2022   CL 99 03/21/2022   CO2 25 03/21/2022   Lab Results  Component Value Date   ALT 17 10/18/2021   AST 18 10/18/2021   ALKPHOS 80 10/18/2021   BILITOT 0.6 10/18/2021   Lab Results  Component Value Date   CHOL 107 07/19/2021   HDL 42.00 07/19/2021   LDLCALC 44 07/19/2021   LDLDIRECT 129.2 05/28/2007   TRIG 103.0 07/19/2021   CHOLHDL 3 07/19/2021    Lab Results  Component Value Date   HGBA1C 5.7 09/07/2017    Review of Prior Studies: Echocardiogram 12/07/2020 1. Left ventricular ejection fraction, by estimation, is 55 to 60%. The  left ventricle has normal function. The left ventricle has no regional  wall motion abnormalities. Left ventricular diastolic parameters are  indeterminate.   2. Right ventricular systolic function is normal. The right ventricular  size is normal.   3. The mitral valve is  grossly normal. Trivial mitral valve  regurgitation.   4. The aortic valve is tricuspid. Aortic valve regurgitation is mild. No  aortic stenosis is present.   5. There is borderline dilatation of the ascending aorta, measuring 37  mm.   6. The inferior vena cava is normal in size with greater than 50%  respiratory variability, suggesting right atrial pressure of 3 mmHg.   Assessment & Plan   1.  Hypertension: Currently elevated today I did recheck it and it was slightly lower at 140/96.  Prior blood pressures have been in the 130s over 70s.  Will continue to monitor this through her stressful.  Since the death of her husband.  I think with some sleep and recovery she should do better.  For now will not make any changes or additions to her medication regimen, and continue metoprolol 50 mg daily.  EKG reveals bradycardia but not significantly so.  Continue to follow  2.  Hypercholesterolemia: She is currently on statin therapy with atorvastatin 80 mg daily.  She also is on Zetia 10 mg daily.  Her primary care provider is managing her labs.  She is currently in the process of getting appointments with her primary care physician and other physicians that she has let go by the [facility] in the care of her husband prior to  his death.  I have encouraged her to follow-up with her primary care for ongoing management of her health issues noncardiac related.  3.  Situational depression and anxiety with insomnia: I have given her a low-dose of Ambien 5 mg (only 10 tablets), that she is to take up to 3 times a week as needed.  She is to follow-up with her primary care provider to evaluate its effectiveness and need to continue or not.  I have advised her if she feels groggy or sleepy the following day this would not be the medicine for her to continue.  There are no refills provided and she is aware of this.  She may need to be placed on a antidepressant but I will defer this to her primary care provider as this is  more of a situational.  Concerning the death of her husband.  Reassurance is given.   Current medicines are reviewed at length with the patient today.  I have spent 30 min's  dedicated to the care of this patient on the date of this encounter to include pre-visit review of records, assessment, management and diagnostic testing,with shared decision making. Signed, Phill Myron. [place] Pugh, ANP, [place]   06/13/2022 12:38 PM      Office [phone] Fax [phone]  Notice: This dictation was prepared with Dragon dictation along with smaller phrase technology. Any transcriptional errors that result from this process are unintentional and may not be corrected upon review.

## 2022-06-13 ENCOUNTER — Encounter: Payer: Self-pay | Admitting: Adult Health

## 2022-06-13 ENCOUNTER — Ambulatory Visit (INDEPENDENT_AMBULATORY_CARE_PROVIDER_SITE_OTHER): Payer: Medicare PPO | Admitting: Adult Health

## 2022-06-13 VITALS — BP 142/98 | HR 54 | Ht 64.5 in | Wt 122.2 lb

## 2022-06-13 DIAGNOSIS — F4321 Adjustment disorder with depressed mood: Secondary | ICD-10-CM

## 2022-06-13 DIAGNOSIS — I10 Essential (primary) hypertension: Secondary | ICD-10-CM

## 2022-06-13 DIAGNOSIS — G4709 Other insomnia: Secondary | ICD-10-CM | POA: Diagnosis not present

## 2022-06-13 DIAGNOSIS — E7800 Pure hypercholesterolemia, unspecified: Secondary | ICD-10-CM

## 2022-06-13 MED ORDER — ZOLPIDEM TARTRATE 5 MG PO TABS: 5.0000 mg | ORAL_TABLET | Freq: Every evening | ORAL | 0 refills | Status: DC | PRN

## 2022-06-13 NOTE — Patient Instructions (Signed)
Medication Instructions:  Start Ambien 5 mg ( Take 1 Tablet at Bedtime as Directed). *If you need a refill on your cardiac medications before your next appointment, please call your pharmacy*   Lab Work: No Labs If you have labs (blood work) drawn today and your tests are completely normal, you will receive your results only by: [place] (if you have MyChart) OR A paper copy in the mail If you have any lab test that is abnormal or we need to change your treatment, we will call you to review the results.   Testing/Procedures: No Testing   Follow-Up: At [facility], you and your health needs are our priority.  As part of our continuing mission to provide you with exceptional heart care, we have created designated Provider Care Teams.  These Care Teams include your primary Cardiologist (physician) and Advanced Practice Providers (APPs -  Physician Assistants and Nurse Practitioners) who all work together to provide you with the care you need, when you need it.  We recommend signing up for the patient portal called "MyChart".  Sign up information is provided on this After Visit Summary.  MyChart is used to connect with patients for Virtual Visits (Telemedicine).  Patients are able to view lab/test results, encounter notes, upcoming appointments, etc.  Non-urgent messages can be sent to your provider as well.   To learn more about what you can do with MyChart, go to [email or web address].    Your next appointment:   6 month(s)  The format for your next appointment:   In Person  Provider:   Kirk Ruths, MD

## 2022-06-17 NOTE — Addendum Note (Signed)
Addended by: Merri Ray A on: 06/17/2022 01:53 PM   Modules accepted: Orders

## 2022-06-20 ENCOUNTER — Ambulatory Visit: Payer: Medicare PPO

## 2022-06-24 ENCOUNTER — Other Ambulatory Visit: Payer: Self-pay

## 2022-06-24 NOTE — Patient Outreach (Signed)
[facility]) Care Management  06/24/2022  Patricia Peck July 27, 1946 [id number]   Telephone call to patient for nurse call. Patient reports she thinks she is doing ok taking into account that she lost her spouse recently.  She lives in her home with her son. She has family and friends support.  Discussed need for annual wellness visit.  Last completed in December 2023.  Patient to call to schedule visit.    Discussed [facility] services and support. Patient declined any needs at this time and declined nurse further follow up.    Plan: RN CM will close case.    Jone Baseman, RN, MSN [facility] Care Management Care Management Coordinator Direct Line [phone] Toll Free: [phone]  Fax: [phone]

## 2022-06-27 DIAGNOSIS — Z1272 Encounter for screening for malignant neoplasm of vagina: Secondary | ICD-10-CM | POA: Diagnosis not present

## 2022-06-27 DIAGNOSIS — N390 Urinary tract infection, site not specified: Secondary | ICD-10-CM | POA: Diagnosis not present

## 2022-06-27 DIAGNOSIS — Z6821 Body mass index (BMI) 21.0-21.9, adult: Secondary | ICD-10-CM | POA: Diagnosis not present

## 2022-06-27 DIAGNOSIS — Z124 Encounter for screening for malignant neoplasm of cervix: Secondary | ICD-10-CM | POA: Diagnosis not present

## 2022-06-27 DIAGNOSIS — Z1151 Encounter for screening for human papillomavirus (HPV): Secondary | ICD-10-CM | POA: Diagnosis not present

## 2022-06-27 LAB — HM PAP SMEAR

## 2022-06-30 NOTE — Telephone Encounter (Signed)
Na

## 2022-07-03 DIAGNOSIS — R3989 Other symptoms and signs involving the genitourinary system: Secondary | ICD-10-CM | POA: Diagnosis not present

## 2022-07-03 DIAGNOSIS — N390 Urinary tract infection, site not specified: Secondary | ICD-10-CM | POA: Diagnosis not present

## 2022-07-03 DIAGNOSIS — N760 Acute vaginitis: Secondary | ICD-10-CM | POA: Diagnosis not present

## 2022-07-04 ENCOUNTER — Ambulatory Visit: Payer: Medicare PPO

## 2022-07-08 DIAGNOSIS — N3946 Mixed incontinence: Secondary | ICD-10-CM | POA: Diagnosis not present

## 2022-07-08 DIAGNOSIS — N3021 Other chronic cystitis with hematuria: Secondary | ICD-10-CM | POA: Diagnosis not present

## 2022-07-10 NOTE — Progress Notes (Signed)
HPI: FU atrial fibrillation and TIA. Brain MRI in January 2014 showed no acute infarct, mild small vessel disease. TEE 2/14 showed normal LV function, mild mitral valve prolapse with mild mitral regurgitation, trace aortic insufficiency. There was no PFO. Monitor in March of 2014 showed paroxysmal atrial fibrillation/flutter. She was therefore started on anticoagulation. Carotid Dopplers 10/20 showed 1-39% bilateral stenosis; left subclavian stenosis noted.  Cardiac CTA October 2020 showed calcium score 7 but no obstructive coronary disease. Monitor June 2021 showed sinus rhythm with PACs and PAF.  Echocardiogram February 2022 showed normal LV function, mild aortic insufficiency. Since I last saw her she lost her husband 8 weeks ago.  Her blood pressure has been elevated since that time.  She denies chest pain, palpitations, syncope, bleeding or dyspnea.  Current Outpatient Medications  Medication Sig Dispense Refill   ADVAIR DISKUS 100-50 MCG/DOSE AEPB Inhale 1 puff into the lungs in the morning and at bedtime. Maintain upcoming appt for additional refill 60 each 0   albuterol (VENTOLIN HFA) 108 (90 Base) MCG/ACT inhaler Inhale 1 puff into the lungs every 6 (six) hours as needed.     Alpha-D-Galactosidase (BEANO PO) Take 1 tablet by mouth 2 (two) times daily.     atorvastatin (LIPITOR) 80 MG tablet Take 1 tablet (80 mg total) by mouth daily. 90 tablet 3   Azelastine-Fluticasone 137-50 MCG/ACT SUSP Place 1 spray into both nostrils daily.     bumetanide (BUMEX) 1 MG tablet TAKE 1 TABLET(1 MG) BY MOUTH TWICE DAILY 180 tablet 1   Calcium Carbonate-Vitamin D 600-400 MG-UNIT tablet Take 1 tablet by mouth 2 (two) times daily.     Cholecalciferol (VITAMIN D-3) 5000 UNITS TABS Take 5,000 Units by mouth 3 (three) times a week. Tuesday Thursday Saturday     Cranberry 450 MG CAPS Take 2 capsules by mouth daily.     denosumab (PROLIA) 60 MG/ML SOLN injection Inject 60 mg into the skin once. Administer in  upper arm, thigh, or abdomen (Patient taking differently: Inject 60 mg into the skin every 6 (six) months. Administer in upper arm, thigh, or abdomen) 180 mL 0   estradiol (ESTRACE) 0.1 MG/GM vaginal cream Place 1 Applicatorful vaginally once a week.     ezetimibe (ZETIA) 10 MG tablet Take 1 tablet (10 mg total) by mouth daily. 90 tablet 3   levocetirizine (XYZAL) 5 MG tablet Take 1 tablet (5 mg total) by mouth daily. 90 tablet 1   levothyroxine (SYNTHROID) 50 MCG tablet Take 1 tablet (50 mcg total) by mouth daily. 90 tablet 3   metoprolol succinate (TOPROL-XL) 50 MG 24 hr tablet Take 1 tablet (50 mg total) by mouth daily. Maintain upcoming appt for additional refill 90 tablet 3   montelukast (SINGULAIR) 10 MG tablet Take 1 tablet (10 mg total) by mouth at bedtime. 90 tablet 1   naphazoline-pheniramine (EYE ALLERGY RELIEF) 0.025-0.3 % ophthalmic solution Place 1 drop into both eyes daily.     NEXIUM 40 MG capsule Take 1 capsule (40 mg total) by mouth daily at 12 noon. 90 capsule 1   nitrofurantoin, macrocrystal-monohydrate, (MACROBID) 100 MG capsule Take 1 capsule (100 mg total) by mouth 2 (two) times daily. To complete 7days course 4 capsule 0   OVER THE COUNTER MEDICATION Take 2 tablets by mouth daily. Bounty  Hair,Nail, and Skin daily gummies.     phenazopyridine (PYRIDIUM) 100 MG tablet Take 1 tablet (100 mg total) by mouth 3 (three) times daily as needed for pain.  10 tablet 0   potassium chloride (KLOR-CON) 10 MEQ tablet Take 2 tablets (20 mEq total) by mouth 2 (two) times daily. 180 tablet 1   rOPINIRole (REQUIP) 0.25 MG tablet Take 1 tablet (0.25 mg total) by mouth at bedtime. 90 tablet 3   tiZANidine (ZANAFLEX) 4 MG tablet Take 1 tablet (4 mg total) by mouth every 6 (six) hours as needed for muscle spasms. 30 tablet 0   traZODone (DESYREL) 50 MG tablet Take 1 tablet (50 mg total) by mouth at bedtime. 90 tablet 3   Vibegron (GEMTESA) 75 MG TABS Take 1 tablet by mouth daily.     vitamin E 180  MG (400 UNITS) capsule Take 400 Units by mouth 3 (three) times a week. Tuesdays, Thursdays, and Saturdays     XARELTO 20 MG TABS tablet TAKE 1 TABLET(20 MG) BY MOUTH DAILY WITH SUPPER 90 tablet 1   zolpidem (AMBIEN) 5 MG tablet Take 1 tablet (5 mg total) by mouth at bedtime as needed for sleep. 10 tablet 0   mirabegron ER (MYRBETRIQ) 50 MG TB24 tablet Take 50 mg by mouth daily. (Patient not taking: Reported on 05/29/2022)     No current facility-administered medications for this visit.     Past Medical History:  Diagnosis Date   Allergy    Arthritis    hands   Asthma    uses an inhaler   Atrial fibrillation (HCC)    Atrial flutter (HCC)    Depression    Dysrhythmia    a-fib, on Xarelto   GERD (gastroesophageal reflux disease)    Hyperlipidemia    Hypertension    Hypothyroidism    Osteoporosis    Stroke ([place]) 2017   TIA   TIA (transient ischemic attack)     Past Surgical History:  Procedure Laterality Date   ABDOMINAL HYSTERECTOMY     COLONOSCOPY     MYRINGOTOMY WITH TUBE PLACEMENT Right 01/14/2016   Procedure: RIGHT MYRINGOTOMY WITH T-TUBE PLACEMENT;  Surgeon: Leta Baptist, MD;  Location: [facility];  Service: ENT;  Laterality: Right;   [facility] OOPHERECTOMY Bilateral 02/14/2021   Procedure: XI ROBOTIC ASSISTED BILATERAL SALPINGO OOPHORECTOMY;  Surgeon: Everitt Amber, MD;  Location: [facility];  Service: Gynecology;  Laterality: Bilateral;   TEE WITHOUT CARDIOVERSION N/A 12/22/2012   Procedure: TRANSESOPHAGEAL ECHOCARDIOGRAM (TEE);  Surgeon: Lelon Perla, MD;  Location: [facility] ENDOSCOPY;  Service: Cardiovascular;  Laterality: N/A;    Social History   Socioeconomic History   Marital status: Widowed    Spouse name: Not on file   Number of children: 2   Years of education: Not on file   Highest education level: Not on file  Occupational History    Employer: RETIRED  Tobacco Use   Smoking status: Never    Smokeless tobacco: Never  Vaping Use   Vaping Use: Never used  Substance and Sexual Activity   Alcohol use: Yes    Comment: Rare   Drug use: No   Sexual activity: Not Currently  Other Topics Concern   Not on file  Social History Narrative   Does have HPOA- sons Eddie Dibbles and Wille Glaser.   Does desire life support if not futile.   Not sure about feeding tubes.   Social Determinants of Health   Financial Resource Strain: Low Risk  (10/31/2021)   Overall Financial Resource Strain (CARDIA)    Difficulty of Paying Living Expenses: Not hard at all  Food Insecurity:  No Food Insecurity (10/09/2021)   Hunger Vital Sign    Worried About Running Out of Food in the Last Year: Never true    Ran Out of Food in the Last Year: Never true  Transportation Needs: No Transportation Needs (10/09/2021)   PRAPARE - Hydrologist (Medical): No    Lack of Transportation (Non-Medical): No  Physical Activity: Inactive (10/09/2021)   Exercise Vital Sign    Days of Exercise per Week: 0 days    Minutes of Exercise per Session: 0 min  Stress: No Stress Concern Present (10/09/2021)   [place]    Feeling of Stress : Not at all  Social Connections: Moderately Isolated (10/09/2021)   Social Connection and Isolation Panel [NHANES]    Frequency of Communication with Friends and Family: Twice a week    Frequency of Social Gatherings with Friends and Family: Twice a week    Attends Religious Services: Never    Marine scientist or Organizations: No    Attends Archivist Meetings: Never    Marital Status: Married  Human resources officer Violence: Not At Risk (10/09/2021)   Humiliation, Afraid, Rape, and Kick questionnaire    Fear of Current or Ex-Partner: No    Emotionally Abused: No    Physically Abused: No    Sexually Abused: No    Family History  Problem Relation Age of Onset   Breast cancer Mother    Drug abuse  Son    Depression Son    Stroke Paternal Aunt    Heart disease Paternal Grandfather    Diabetes Maternal Grandfather    Colon cancer Neg Hx    Ovarian cancer Neg Hx    Endometrial cancer Neg Hx    Pancreatic cancer Neg Hx    Prostate cancer Neg Hx     ROS: no fevers or chills, productive cough, hemoptysis, dysphasia, odynophagia, melena, hematochezia, dysuria, hematuria, rash, seizure activity, orthopnea, PND, pedal edema, claudication. Remaining systems are negative.  Physical Exam: Well-developed well-nourished in no acute distress.  Skin is warm and dry.  HEENT is normal.  Neck is supple.  Chest is clear to auscultation with normal expansion.  Cardiovascular exam is irregular Abdominal exam nontender or distended. No masses palpated. Extremities show no edema. neuro grossly intact  A/P  1 permanent atrial fibrillation-continue Toprol for rate control.  Continue Xarelto.  Check hemoglobin and renal function.  2 hypertension-patient's blood pressure is elevated.  Likely secondary to the stress of losing her husband.  We will begin amlodipine 5 mg daily and follow.  3 hyperlipidemia-continue statin and resume Zetia 10 mg daily.  Check lipids and liver in 8 weeks.  4 carotid artery disease-noted to be mild on most recent Dopplers.  5 subclavian stenosis-no symptoms.  Continue statin.  6 mild aortic insufficiency-we will plan follow-up echoes in the future.  7 mitral valve prolapse-this was not evident on most recent echocardiogram.  Kirk Ruths, MD

## 2022-07-11 ENCOUNTER — Encounter: Payer: Self-pay | Admitting: Cardiology

## 2022-07-11 ENCOUNTER — Ambulatory Visit: Payer: Medicare PPO | Attending: Cardiology | Admitting: Cardiology

## 2022-07-11 ENCOUNTER — Ambulatory Visit
Admission: RE | Admit: 2022-07-11 | Discharge: 2022-07-11 | Disposition: A | Discharge status: Home / Self Care | Payer: Medicare PPO | Source: Ambulatory Visit | Attending: Family Medicine | Admitting: Family Medicine

## 2022-07-11 VITALS — BP 178/84 | HR 66 | Ht 63.5 in | Wt 118.8 lb

## 2022-07-11 DIAGNOSIS — I4821 Permanent atrial fibrillation: Secondary | ICD-10-CM | POA: Diagnosis not present

## 2022-07-11 DIAGNOSIS — I10 Essential (primary) hypertension: Secondary | ICD-10-CM | POA: Diagnosis not present

## 2022-07-11 DIAGNOSIS — I059 Rheumatic mitral valve disease, unspecified: Secondary | ICD-10-CM | POA: Diagnosis not present

## 2022-07-11 DIAGNOSIS — Z1231 Encounter for screening mammogram for malignant neoplasm of breast: Secondary | ICD-10-CM

## 2022-07-11 DIAGNOSIS — E7800 Pure hypercholesterolemia, unspecified: Secondary | ICD-10-CM

## 2022-07-11 MED ORDER — AMLODIPINE BESYLATE 5 MG PO TABS: 5.0000 mg | ORAL_TABLET | Freq: Every day | ORAL | 3 refills | Status: DC

## 2022-07-11 MED ORDER — EZETIMIBE 10 MG PO TABS: 10.0000 mg | ORAL_TABLET | Freq: Every day | ORAL | 3 refills | Status: DC

## 2022-07-11 NOTE — Patient Instructions (Signed)
Medication Instructions:   START AMLODIPINE 5 MG ONCE DAILY  RESTART EZETIMIBE 10 MG ONCE DAILY  *If you need a refill on your cardiac medications before your next appointment, please call your pharmacy*   Lab Work:  Your physician recommends that you return for lab work in: [place]  If you have labs (blood work) drawn today and your tests are completely normal, you will receive your results only by: [place] (if you have MyChart) OR A paper copy in the mail If you have any lab test that is abnormal or we need to change your treatment, we will call you to review the results.   Follow-Up: At [facility], you and your health needs are our priority.  As part of our continuing mission to provide you with exceptional heart care, we have created designated Provider Care Teams.  These Care Teams include your primary Cardiologist (physician) and Advanced Practice Providers (APPs -  Physician Assistants and Nurse Practitioners) who all work together to provide you with the care you need, when you need it.  We recommend signing up for the patient portal called "MyChart".  Sign up information is provided on this After Visit Summary.  MyChart is used to connect with patients for Virtual Visits (Telemedicine).  Patients are able to view lab/test results, encounter notes, upcoming appointments, etc.  Non-urgent messages can be sent to your provider as well.   To learn more about what you can do with MyChart, go to [email or web address].    Your next appointment:   4 month(s)  The format for your next appointment:   In Person  Provider:   Kirk Ruths, MD

## 2022-07-16 ENCOUNTER — Other Ambulatory Visit: Payer: Self-pay | Admitting: Nurse Practitioner

## 2022-07-16 DIAGNOSIS — E038 Other specified hypothyroidism: Secondary | ICD-10-CM

## 2022-07-16 NOTE — Telephone Encounter (Signed)
Chart supports Rx Last OV: 05/2022 Next OV: 07/2022

## 2022-07-17 ENCOUNTER — Ambulatory Visit (INDEPENDENT_AMBULATORY_CARE_PROVIDER_SITE_OTHER): Payer: Medicare PPO | Admitting: Family Medicine

## 2022-07-17 ENCOUNTER — Ambulatory Visit (INDEPENDENT_AMBULATORY_CARE_PROVIDER_SITE_OTHER): Payer: Medicare PPO | Admitting: *Deleted

## 2022-07-17 ENCOUNTER — Encounter: Payer: Self-pay | Admitting: Family Medicine

## 2022-07-17 ENCOUNTER — Encounter: Payer: Self-pay | Admitting: *Deleted

## 2022-07-17 VITALS — BP 136/86 | HR 79 | Temp 97.4°F | Wt 121.6 lb

## 2022-07-17 DIAGNOSIS — R399 Unspecified symptoms and signs involving the genitourinary system: Secondary | ICD-10-CM | POA: Diagnosis not present

## 2022-07-17 LAB — POCT URINALYSIS DIPSTICK
Bilirubin, UA: NEGATIVE
Blood, UA: NEGATIVE
Glucose, UA: NEGATIVE
Ketones, UA: NEGATIVE
Nitrite, UA: NEGATIVE
Protein, UA: NEGATIVE
Spec Grav, UA: 1.01 (ref 1.010–1.025)
Urobilinogen, UA: 0.2 E.U./dL
pH, UA: 6.5 (ref 5.0–8.0)

## 2022-07-17 MED ORDER — CEFTRIAXONE SODIUM 1 G IJ SOLR: 1.0000 g | Freq: Once | INTRAMUSCULAR | Status: DC

## 2022-07-17 MED ORDER — CEFTRIAXONE SODIUM 1 G IJ SOLR
1.0000 g | Freq: Once | INTRAMUSCULAR | Status: AC
  Administered 2022-07-17: 1 g via INTRAMUSCULAR

## 2022-07-17 MED ORDER — CEFDINIR 300 MG PO CAPS: 300.0000 mg | ORAL_CAPSULE | Freq: Two times a day (BID) | ORAL | 0 refills | Status: AC

## 2022-07-17 NOTE — Assessment & Plan Note (Signed)
Ongoing UTI symptoms Has had testing at outside clinics consistent with UTI, however I cannot visualize the sensitivities Has completed extensive course of Macrobid Recently worsening UA today does not show extensive LE or nitrates, however given recent testing and symptoms, high suspicion for ongoing or incompletely treated UTI Of the urine culture that I can see, patient UTI in 02/2022 did have sensitivities to ceftriaxone 1 g of ceftriaxone given today IM We will follow this with a course of cefdinir for 7 days Patient to follow-up with urologist to see if recent sensitivities on urine culture are consistent with this We are also requesting records from urology and OB/GYN Return precautions discussed

## 2022-07-17 NOTE — Patient Instructions (Addendum)
Go to [facility] [place] to get Rocephin.   Please reach out to [facility] to see if the antibiotics prescribed (ceftriaxone and Cefdinir) are going to cover your urinary tract infection

## 2022-07-17 NOTE — Progress Notes (Signed)
Assessment/Plan:   Problem List Items Addressed This Visit       Other   UTI symptoms - Primary    Ongoing UTI symptoms Has had testing at outside clinics consistent with UTI, however I cannot visualize the sensitivities Has completed extensive course of Macrobid Recently worsening UA today does not show extensive LE or nitrates, however given recent testing and symptoms, high suspicion for ongoing or incompletely treated UTI Of the urine culture that I can see, patient UTI in 02/2022 did have sensitivities to ceftriaxone 1 g of ceftriaxone given today IM We will follow this with a course of cefdinir for 7 days Patient to follow-up with urologist to see if recent sensitivities on urine culture are consistent with this We are also requesting records from urology and OB/GYN Return precautions discussed      Relevant Medications   cefTRIAXone (ROCEPHIN) injection 1 g   cefdinir (OMNICEF) 300 MG capsule   Other Relevant Orders   POCT Urinalysis Dipstick (Completed)   Urinalysis   Urine Culture       Subjective:  HPI:  TRAYONNA BACHMEIER is a 76 y.o. female who has Hypothyroidism; Depression; Essential hypertension; Allergic rhinitis; GERD; Osteoporosis; Insomnia; PAF (paroxysmal atrial fibrillation) ([place]); Cerebrovascular disease; IBS (irritable bowel syndrome); Chronic allergic conjunctivitis; Eustachian tube disorder; Situational anxiety; Hypercholesterolemia; History of TIA (transient ischemic attack); Anticoagulated; Stenosis of left subclavian artery ([place]); Left arm pain; Right ovarian cyst; Cystitis; Mild intermittent asthma without complication; Vasomotor rhinitis; Dysuria; Fall; and UTI symptoms on their problem list..   She  has a past medical history of Allergy, Arthritis, Asthma, Atrial fibrillation ([place]), Atrial flutter ([place]), Depression, Dysrhythmia, GERD (gastroesophageal reflux disease), Hyperlipidemia, Hypertension, Hypothyroidism, Osteoporosis, Stroke ([place]) (2017),  and TIA (transient ischemic attack)..   She presents with chief complaint of UTI symptoms (Feels like she has to urinate, but isn't able to. She states that she has urinated on herself. ) .    Patient reports ongoing urinary symptoms since the beginning of September.  She was evaluated first by her OB/GYN.  Patient had UA that was consistent with UTI, also had urine culture.  Although culture resulted, cannot see the results.  Patient was treated with course of Macrobid.  Approximately 1 week later patient was seen by urology.  Patient reported ongoing dysuria and urinary frequency.  [facility], consistent with ongoing UA symptoms.  Patient was put on extended course of Macrobid.  Patient reports that she had mild improvement in her symptoms but then had worsening recurrence today.  Symptoms include dysuria and urgency.  Patient denies any fevers, chills, nausea, vomiting, diarrhea, constipation.  Patient is tolerating p.o. well.   Past Surgical History:  Procedure Laterality Date   ABDOMINAL HYSTERECTOMY     COLONOSCOPY     MYRINGOTOMY WITH TUBE PLACEMENT Right 01/14/2016   Procedure: RIGHT MYRINGOTOMY WITH T-TUBE PLACEMENT;  Surgeon: Leta Baptist, MD;  Location: [facility];  Service: ENT;  Laterality: Right;   PARTIAL HYSTERECTOMY  1989   ROBOTIC ASSISTED BILATERAL SALPINGO OOPHERECTOMY Bilateral 02/14/2021   Procedure: XI ROBOTIC ASSISTED BILATERAL SALPINGO OOPHORECTOMY;  Surgeon: Everitt Amber, MD;  Location[facility];  Service: Gynecology;  Laterality: Bilateral;   TEE WITHOUT CARDIOVERSION N/A 12/22/2012   Procedure: TRANSESOPHAGEAL ECHOCARDIOGRAM (TEE);  Surgeon: Lelon Perla, MD;  Location: [facility] ENDOSCOPY;  Service: Cardiovascular;  Laterality: N/A;    Outpatient Medications Prior to Visit  Medication Sig Dispense Refill   ADVAIR DISKUS 100-50 MCG/DOSE  AEPB Inhale 1 puff into the lungs in the morning and at bedtime. Maintain upcoming appt for additional  refill 60 each 0   albuterol (VENTOLIN HFA) 108 (90 Base) MCG/ACT inhaler Inhale 1 puff into the lungs every 6 (six) hours as needed.     Alpha-D-Galactosidase (BEANO PO) Take 1 tablet by mouth 2 (two) times daily.     amLODipine (NORVASC) 5 MG tablet Take 1 tablet (5 mg total) by mouth daily. 90 tablet 3   atorvastatin (LIPITOR) 80 MG tablet Take 1 tablet (80 mg total) by mouth daily. 90 tablet 3   Azelastine-Fluticasone 137-50 MCG/ACT SUSP Place 1 spray into both nostrils daily.     bumetanide (BUMEX) 1 MG tablet TAKE 1 TABLET(1 MG) BY MOUTH TWICE DAILY 180 tablet 1   Calcium Carbonate-Vitamin D 600-400 MG-UNIT tablet Take 1 tablet by mouth 2 (two) times daily.     Cholecalciferol (VITAMIN D-3) 5000 UNITS TABS Take 5,000 Units by mouth 3 (three) times a week. Tuesday Thursday Saturday     Cranberry 450 MG CAPS Take 2 capsules by mouth daily.     denosumab (PROLIA) 60 MG/ML SOLN injection Inject 60 mg into the skin once. Administer in upper arm, thigh, or abdomen (Patient taking differently: Inject 60 mg into the skin every 6 (six) months. Administer in upper arm, thigh, or abdomen) 180 mL 0   estradiol (ESTRACE) 0.1 MG/GM vaginal cream Place 1 Applicatorful vaginally once a week.     ezetimibe (ZETIA) 10 MG tablet Take 1 tablet (10 mg total) by mouth daily. 90 tablet 3   levocetirizine (XYZAL) 5 MG tablet Take 1 tablet (5 mg total) by mouth daily. 90 tablet 1   levothyroxine (SYNTHROID) 50 MCG tablet TAKE 1 TABLET(50 MCG) BY MOUTH DAILY 90 tablet 3   metoprolol succinate (TOPROL-XL) 50 MG 24 hr tablet Take 1 tablet (50 mg total) by mouth daily. Maintain upcoming appt for additional refill 90 tablet 3   montelukast (SINGULAIR) 10 MG tablet Take 1 tablet (10 mg total) by mouth at bedtime. 90 tablet 1   naphazoline-pheniramine (EYE ALLERGY RELIEF) 0.025-0.3 % ophthalmic solution Place 1 drop into both eyes daily.     NEXIUM 40 MG capsule Take 1 capsule (40 mg total) by mouth daily at 12 noon. 90  capsule 1   OVER THE COUNTER MEDICATION Take 2 tablets by mouth daily. Bounty  Hair,Nail, and Skin daily gummies.     potassium chloride (KLOR-CON) 10 MEQ tablet Take 2 tablets (20 mEq total) by mouth 2 (two) times daily. 180 tablet 1   rOPINIRole (REQUIP) 0.25 MG tablet Take 1 tablet (0.25 mg total) by mouth at bedtime. 90 tablet 3   traZODone (DESYREL) 50 MG tablet Take 1 tablet (50 mg total) by mouth at bedtime. 90 tablet 3   Vibegron (GEMTESA) 75 MG TABS Take 1 tablet by mouth daily.     vitamin E 180 MG (400 UNITS) capsule Take 400 Units by mouth 3 (three) times a week. Tuesdays, Thursdays, and Saturdays     XARELTO 20 MG TABS tablet TAKE 1 TABLET(20 MG) BY MOUTH DAILY WITH SUPPER 90 tablet 1   zolpidem (AMBIEN) 5 MG tablet Take 1 tablet (5 mg total) by mouth at bedtime as needed for sleep. 10 tablet 0   mirabegron ER (MYRBETRIQ) 50 MG TB24 tablet Take 50 mg by mouth daily. (Patient not taking: Reported on 05/29/2022)     tiZANidine (ZANAFLEX) 4 MG tablet Take 1 tablet (4 mg total) by  mouth every 6 (six) hours as needed for muscle spasms. 30 tablet 0   nitrofurantoin, macrocrystal-monohydrate, (MACROBID) 100 MG capsule Take 1 capsule (100 mg total) by mouth 2 (two) times daily. To complete 7days course (Patient not taking: Reported on 07/17/2022) 4 capsule 0   phenazopyridine (PYRIDIUM) 100 MG tablet Take 1 tablet (100 mg total) by mouth 3 (three) times daily as needed for pain. (Patient not taking: Reported on 07/17/2022) 10 tablet 0   No facility-administered medications prior to visit.    Family History  Problem Relation Age of Onset   Breast cancer Mother    Drug abuse Son    Depression Son    Stroke Paternal Aunt    Heart disease Paternal Grandfather    Diabetes Maternal Grandfather    Colon cancer Neg Hx    Ovarian cancer Neg Hx    Endometrial cancer Neg Hx    Pancreatic cancer Neg Hx    Prostate cancer Neg Hx     Social History   Socioeconomic History   Marital status:  Widowed    Spouse name: Not on file   Number of children: 2   Years of education: Not on file   Highest education level: Not on file  Occupational History    Employer: RETIRED  Tobacco Use   Smoking status: Never   Smokeless tobacco: Never  Vaping Use   Vaping Use: Never used  Substance and Sexual Activity   Alcohol use: Yes    Comment: Rare   Drug use: No   Sexual activity: Not Currently  Other Topics Concern   Not on file  Social History Narrative   Does have HPOA- sons Eddie Dibbles and Wille Glaser.   Does desire life support if not futile.   Not sure about feeding tubes.   Social Determinants of Health   Financial Resource Strain: Low Risk  (10/31/2021)   Overall Financial Resource Strain (CARDIA)    Difficulty of Paying Living Expenses: Not hard at all  Food Insecurity: No Food Insecurity (10/09/2021)   Hunger Vital Sign    Worried About Running Out of Food in the Last Year: Never true    Ran Out of Food in the Last Year: Never true  Transportation Needs: No Transportation Needs (10/09/2021)   PRAPARE - Hydrologist (Medical): No    Lack of Transportation (Non-Medical): No  Physical Activity: Inactive (10/09/2021)   Exercise Vital Sign    Days of Exercise per Week: 0 days    Minutes of Exercise per Session: 0 min  Stress: No Stress Concern Present (10/09/2021)   [place]    Feeling of Stress : Not at all  Social Connections: Moderately Isolated (10/09/2021)   Social Connection and Isolation Panel [NHANES]    Frequency of Communication with Friends and Family: Twice a week    Frequency of Social Gatherings with Friends and Family: Twice a week    Attends Religious Services: Never    Marine scientist or Organizations: No    Attends Archivist Meetings: Never    Marital Status: Married  Human resources officer Violence: Not At Risk (10/09/2021)   Humiliation, Afraid, Rape, and Kick  questionnaire    Fear of Current or Ex-Partner: No    Emotionally Abused: No    Physically Abused: No    Sexually Abused: No  Objective:  Physical Exam: BP 136/86 (BP Location: Left Arm, Patient Position: Sitting, Cuff Size: Large)   Pulse 79   Temp (!) 97.4 F (36.3 C) (Temporal)   Wt 121 lb 9.6 oz (55.2 kg)   SpO2 97%   BMI 21.20 kg/m    General: No acute distress. Awake and conversant.  Eyes: Normal conjunctiva, anicteric. Round symmetric pupils.  ENT: Hearing grossly intact. No nasal discharge.  Neck: Neck is supple. No masses or thyromegaly.  Respiratory: Respirations are non-labored. No auditory wheezing.  CTA B ABD: Nontender nondistended GU: No CVA tenderness bilaterally Skin: Warm. No rashes or ulcers.  Psych: Alert and oriented. Cooperative, Appropriate mood and affect, Normal judgment.  CV: No cyanosis or JVD, RRR, no MRG MSK: Normal ambulation. No clubbing  Neuro: Sensation and CN II-XII grossly normal.        Alesia Banda, MD, MS

## 2022-07-17 NOTE — Progress Notes (Signed)
Per orders of Dr. Josephine Igo, injection of Rocephin 1 g given IM by Anselmo Pickler, LPN in Right upper outer quadrant  Patient tolerated injection well. Pt observed for 10 minutes after injection given. No Adverse reaction noted.

## 2022-07-18 LAB — URINALYSIS, ROUTINE W REFLEX MICROSCOPIC
Bilirubin Urine: NEGATIVE
Hgb urine dipstick: NEGATIVE
Ketones, ur: NEGATIVE
Nitrite: NEGATIVE
RBC / HPF: NONE SEEN (ref 0–?)
Specific Gravity, Urine: <=1.005 — AB (ref 1.000–1.030)
Total Protein, Urine: NEGATIVE
Urine Glucose: NEGATIVE
Urobilinogen, UA: 0.2 (ref 0.0–1.0)
pH: 7 (ref 5.0–8.0)

## 2022-07-18 LAB — URINE CULTURE
MICRO NUMBER:: 13917955
SPECIMEN QUALITY:: ADEQUATE

## 2022-07-24 ENCOUNTER — Encounter: Payer: Self-pay | Admitting: Urology

## 2022-07-24 ENCOUNTER — Telehealth: Payer: Self-pay | Admitting: Cardiology

## 2022-07-24 ENCOUNTER — Ambulatory Visit (INDEPENDENT_AMBULATORY_CARE_PROVIDER_SITE_OTHER): Payer: Medicare PPO | Admitting: Urology

## 2022-07-24 VITALS — BP 191/103 | HR 96 | Ht 63.0 in | Wt 120.0 lb

## 2022-07-24 DIAGNOSIS — R300 Dysuria: Secondary | ICD-10-CM

## 2022-07-24 DIAGNOSIS — R350 Frequency of micturition: Secondary | ICD-10-CM | POA: Diagnosis not present

## 2022-07-24 DIAGNOSIS — R3915 Urgency of urination: Secondary | ICD-10-CM | POA: Diagnosis not present

## 2022-07-24 DIAGNOSIS — I10 Essential (primary) hypertension: Secondary | ICD-10-CM

## 2022-07-24 DIAGNOSIS — N390 Urinary tract infection, site not specified: Secondary | ICD-10-CM | POA: Diagnosis not present

## 2022-07-24 MED ORDER — AMLODIPINE BESYLATE 10 MG PO TABS: 10.0000 mg | ORAL_TABLET | Freq: Every day | ORAL | 3 refills | Status: DC

## 2022-07-24 MED ORDER — URIBEL 118 MG PO CAPS: 118.0000 mg | ORAL_CAPSULE | Freq: Two times a day (BID) | ORAL | 1 refills | Status: DC

## 2022-07-24 NOTE — Telephone Encounter (Signed)
Spoke with pt, the urologist office told her to call regarding her bp. At home, prior to medications in the morning and bp later in the day are all running high. She reports the following, 187/91, 136/100, 151/108, 165/102 with a pulse of 92-71 bpm. Medications confirmed and aware will forward for dr Jacalyn Lefevre review. Advised patient to take the morning bp about 2 hours after taking her medications.

## 2022-07-24 NOTE — Telephone Encounter (Signed)
Left message for pt to call.

## 2022-07-24 NOTE — Progress Notes (Signed)
07/24/2022 2:57 PM   Patricia Peck 05-31-1946 [id number]  Referring provider: Marylynn Pearson, [address], [place],  [place] [address]  Chief Complaint  Patient presents with   Recurrent UTI    HPI: Patricia Peck is a 76 y.o. female referred for evaluation of recurrent UTIs.  Over the past several weeks with symptoms of frequency, urgency, dysuria and vaginal burning Record review remarkable for a negative urine cultures 07/17/2022, 07/05/2022.  Positive E. coli 02/24/2022 Has been treated with antibiotics without improvement Denies gross hematuria or history of febrile UTI Has been on Myrbetriq for overactive bladder and recently switched to Gemtesa Estrace cream listed on her medication list however she has not taken due to concerns of breast cancer Presently completing a course of cefdinir with persistent symptoms  PMH: Past Medical History:  Diagnosis Date   Allergy    Arthritis    hands   Asthma    uses an inhaler   Atrial fibrillation ([facility])    Atrial flutter ([place])    Depression    Dysrhythmia    a-fib, on Xarelto   GERD (gastroesophageal reflux disease)    Hyperlipidemia    Hypertension    Hypothyroidism    Osteoporosis    Stroke ([place]) 2017   TIA   TIA (transient ischemic attack)     Surgical History: Past Surgical History:  Procedure Laterality Date   ABDOMINAL HYSTERECTOMY     COLONOSCOPY     MYRINGOTOMY WITH TUBE PLACEMENT Right 01/14/2016   Procedure: RIGHT MYRINGOTOMY WITH T-TUBE PLACEMENT;  Surgeon: Leta Baptist, MD;  Location: [facility];  Service: ENT;  Laterality: Right;   PARTIAL HYSTERECTOMY  1989   ROBOTIC ASSISTED BILATERAL SALPINGO OOPHERECTOMY Bilateral 02/14/2021   Procedure: XI ROBOTIC ASSISTED BILATERAL SALPINGO OOPHORECTOMY;  Surgeon: Everitt Amber, MD;  Location[facility];  Service: Gynecology;  Laterality: Bilateral;   TEE WITHOUT CARDIOVERSION N/A 12/22/2012   Procedure: TRANSESOPHAGEAL ECHOCARDIOGRAM  (TEE);  Surgeon: Lelon Perla, MD;  Location: [facility] ENDOSCOPY;  Service: Cardiovascular;  Laterality: N/A;    Home Medications:  Allergies as of 07/24/2022   No Known Allergies      Medication List        Accurate as of July 24, 2022  2:57 PM. If you have any questions, ask your nurse or doctor.          Advair Diskus 100-50 MCG/ACT Aepb Generic drug: fluticasone-salmeterol Inhale 1 puff into the lungs in the morning and at bedtime. Maintain upcoming appt for additional refill   albuterol 108 (90 Base) MCG/ACT inhaler Commonly known as: VENTOLIN HFA Inhale 1 puff into the lungs every 6 (six) hours as needed.   amLODipine 5 MG tablet Commonly known as: NORVASC Take 1 tablet (5 mg total) by mouth daily.   atorvastatin 80 MG tablet Commonly known as: LIPITOR Take 1 tablet (80 mg total) by mouth daily.   Azelastine-Fluticasone 137-50 MCG/ACT Susp Place 1 spray into both nostrils daily.   BEANO PO Take 1 tablet by mouth 2 (two) times daily.   bumetanide 1 MG tablet Commonly known as: BUMEX TAKE 1 TABLET(1 MG) BY MOUTH TWICE DAILY   Calcium Carbonate-Vitamin D 600-400 MG-UNIT tablet Take 1 tablet by mouth 2 (two) times daily.   cefdinir 300 MG capsule Commonly known as: OMNICEF Take 1 capsule (300 mg total) by mouth 2 (two) times daily for 7 days.   Cranberry 450 MG Caps Take 2 capsules by mouth daily.  denosumab 60 MG/ML Soln injection Commonly known as: PROLIA Inject 60 mg into the skin once. Administer in upper arm, thigh, or abdomen What changed: when to take this   estradiol 0.1 MG/GM vaginal cream Commonly known as: ESTRACE Place 1 Applicatorful vaginally once a week.   Eye Allergy Relief 0.025-0.3 % ophthalmic solution Generic drug: naphazoline-pheniramine Place 1 drop into both eyes daily.   ezetimibe 10 MG tablet Commonly known as: ZETIA Take 1 tablet (10 mg total) by mouth daily.   Gemtesa 75 MG Tabs Generic drug: Vibegron Take 1  tablet by mouth daily.   levocetirizine 5 MG tablet Commonly known as: XYZAL Take 1 tablet (5 mg total) by mouth daily.   levothyroxine 50 MCG tablet Commonly known as: SYNTHROID TAKE 1 TABLET(50 MCG) BY MOUTH DAILY   metoprolol succinate 50 MG 24 hr tablet Commonly known as: TOPROL-XL Take 1 tablet (50 mg total) by mouth daily. Maintain upcoming appt for additional refill   mirabegron ER 50 MG Tb24 tablet Commonly known as: MYRBETRIQ Take 50 mg by mouth daily.   montelukast 10 MG tablet Commonly known as: SINGULAIR Take 1 tablet (10 mg total) by mouth at bedtime.   NexIUM 40 MG capsule Generic drug: esomeprazole Take 1 capsule (40 mg total) by mouth daily at 12 noon.   OVER THE COUNTER MEDICATION Take 2 tablets by mouth daily. Bounty  Hair,Nail, and Skin daily gummies.   potassium chloride 10 MEQ tablet Commonly known as: KLOR-CON Take 2 tablets (20 mEq total) by mouth 2 (two) times daily.   rOPINIRole 0.25 MG tablet Commonly known as: Requip Take 1 tablet (0.25 mg total) by mouth at bedtime.   tiZANidine 4 MG tablet Commonly known as: Zanaflex Take 1 tablet (4 mg total) by mouth every 6 (six) hours as needed for muscle spasms.   traZODone 50 MG tablet Commonly known as: DESYREL Take 1 tablet (50 mg total) by mouth at bedtime.   Uribel 118 MG Caps Take 1 capsule (118 mg total) by mouth in the morning and at bedtime. Started by: Abbie Sons, MD   Vitamin D-3 125 MCG (5000 UT) Tabs Take 5,000 Units by mouth 3 (three) times a week. Tuesday Thursday Saturday   vitamin E 180 MG (400 UNITS) capsule Take 400 Units by mouth 3 (three) times a week. Tuesdays, Thursdays, and Saturdays   Xarelto 20 MG Tabs tablet Generic drug: rivaroxaban TAKE 1 TABLET(20 MG) BY MOUTH DAILY WITH SUPPER   zolpidem 5 MG tablet Commonly known as: Ambien Take 1 tablet (5 mg total) by mouth at bedtime as needed for sleep.        Allergies: No Known Allergies  Family  History: Family History  Problem Relation Age of Onset   Breast cancer Mother    Drug abuse Son    Depression Son    Stroke Paternal Aunt    Heart disease Paternal Grandfather    Diabetes Maternal Grandfather    Colon cancer Neg Hx    Ovarian cancer Neg Hx    Endometrial cancer Neg Hx    Pancreatic cancer Neg Hx    Prostate cancer Neg Hx     Social History:  reports that she has never smoked. She has never used smokeless tobacco. She reports current alcohol use. She reports that she does not use drugs.   Physical Exam: BP (!) 191/103   Pulse 96   Ht '5\' 3"'$  (1.6 m)   Wt 120 lb (54.4 kg)   BMI  21.26 kg/m   Constitutional:  Alert and oriented, No acute distress. HEENT: [place] AT Respiratory: Normal respiratory effort, no increased work of breathing. Psychiatric: Normal mood and affect.  Laboratory Data:  Urinalysis Dipstick/microscopy negative   Assessment & Plan:   76 y.o. female with storage related voiding symptoms, dysuria with recent negative urine cultures and persistent symptoms despite empiric antibiotic therapy.  She does not meet the AUA definition of recurrent UTI We discussed other conditions which can cause voiding symptoms and dysuria including painful bladder syndrome UA without evidence of infection today Recommend Uribel twice daily x1 month Follow-up office visit in 1 month and if still symptomatic at that visit will perform cystoscopy   Abbie Sons, [place] [address], [place], [place] [address] [phone]

## 2022-07-24 NOTE — Telephone Encounter (Signed)
[name] BP issue: STAT if pt c/o blurred vision, one-sided weakness or slurred speech  1. What are your last 5 BP readings? 191/103  2. Are you having any other symptoms (ex. Dizziness, headache, blurred vision, passed out)? No   3. What is your BP issue? Patient was just at the urologist office and her BP was high.  They advised her to give [place] a call.

## 2022-07-24 NOTE — Telephone Encounter (Signed)
Spoke with pt, Aware of dr crenshaw's recommendations. New script sent to the pharmacy  

## 2022-07-25 LAB — URINALYSIS, COMPLETE
Bilirubin, UA: NEGATIVE
Glucose, UA: NEGATIVE
Ketones, UA: NEGATIVE
Leukocytes,UA: NEGATIVE
Nitrite, UA: NEGATIVE
Protein,UA: NEGATIVE
RBC, UA: NEGATIVE
Specific Gravity, UA: 1.015 (ref 1.005–1.030)
Urobilinogen, Ur: 0.2 mg/dL (ref 0.2–1.0)
pH, UA: 7 (ref 5.0–7.5)

## 2022-07-25 LAB — MICROSCOPIC EXAMINATION

## 2022-07-31 ENCOUNTER — Ambulatory Visit (INDEPENDENT_AMBULATORY_CARE_PROVIDER_SITE_OTHER): Payer: Medicare PPO

## 2022-07-31 DIAGNOSIS — M810 Age-related osteoporosis without current pathological fracture: Secondary | ICD-10-CM | POA: Diagnosis not present

## 2022-07-31 MED ORDER — DENOSUMAB 60 MG/ML ~~LOC~~ SOSY
60.0000 mg | PREFILLED_SYRINGE | Freq: Once | SUBCUTANEOUS | Status: AC
  Administered 2022-07-31: 60 mg via SUBCUTANEOUS

## 2022-07-31 NOTE — Progress Notes (Signed)
Per orders of Wilfred Lacy NP pt is here to receive Prolia Injection, pt received Prolia injection in Left Deltoid SubQ. Pt tolerated Prolia Injection well. Pt will return in 6 months for next Prolia Injection. 01/29/2023 10:00 appointment scheduled

## 2022-08-14 ENCOUNTER — Other Ambulatory Visit: Payer: Self-pay | Admitting: Nurse Practitioner

## 2022-08-14 DIAGNOSIS — E876 Hypokalemia: Secondary | ICD-10-CM

## 2022-08-14 DIAGNOSIS — I10 Essential (primary) hypertension: Secondary | ICD-10-CM

## 2022-08-18 NOTE — Telephone Encounter (Signed)
Chart supports Rx Last OV: 07/2022 Next OV: 01/2023  

## 2022-08-20 DIAGNOSIS — L820 Inflamed seborrheic keratosis: Secondary | ICD-10-CM | POA: Diagnosis not present

## 2022-08-20 DIAGNOSIS — Z1283 Encounter for screening for malignant neoplasm of skin: Secondary | ICD-10-CM | POA: Diagnosis not present

## 2022-08-20 DIAGNOSIS — D225 Melanocytic nevi of trunk: Secondary | ICD-10-CM | POA: Diagnosis not present

## 2022-09-03 ENCOUNTER — Other Ambulatory Visit: Payer: Self-pay | Admitting: Cardiology

## 2022-09-03 ENCOUNTER — Other Ambulatory Visit: Payer: Self-pay | Admitting: Nurse Practitioner

## 2022-09-03 DIAGNOSIS — I480 Paroxysmal atrial fibrillation: Secondary | ICD-10-CM

## 2022-09-03 NOTE — Telephone Encounter (Signed)
Per Tommy Medal, PharmD, dose should be decreased to '15mg'$  tablet. Called pt and made her aware of change. Appropriate dose and refill sent to requested pharmacy.

## 2022-09-03 NOTE — Telephone Encounter (Signed)
Prescription refill request for Xarelto received.  Indication: Afib  Last office visit: 07/11/22 Stanford Breed)  Weight: 54.4kg Age: 76 Scr: 0.97 (03/21/22)  CrCl: 41.42m/min  Per dosing criteria, pt should be on '15mg'$  tablet. Message sent to pharmD verify dose.

## 2022-09-04 ENCOUNTER — Other Ambulatory Visit: Payer: Self-pay | Admitting: Adult Health

## 2022-09-05 ENCOUNTER — Encounter: Payer: Self-pay | Admitting: Urology

## 2022-09-05 ENCOUNTER — Ambulatory Visit (INDEPENDENT_AMBULATORY_CARE_PROVIDER_SITE_OTHER): Payer: Medicare PPO | Admitting: Urology

## 2022-09-05 VITALS — BP 193/92 | HR 59 | Ht 63.0 in | Wt 119.0 lb

## 2022-09-05 DIAGNOSIS — R3915 Urgency of urination: Secondary | ICD-10-CM | POA: Diagnosis not present

## 2022-09-05 DIAGNOSIS — R300 Dysuria: Secondary | ICD-10-CM | POA: Diagnosis not present

## 2022-09-05 DIAGNOSIS — R350 Frequency of micturition: Secondary | ICD-10-CM

## 2022-09-05 LAB — URINALYSIS, COMPLETE
Bilirubin, UA: NEGATIVE
Glucose, UA: NEGATIVE
Ketones, UA: NEGATIVE
Nitrite, UA: NEGATIVE
Protein,UA: NEGATIVE
RBC, UA: NEGATIVE
Specific Gravity, UA: 1.015 (ref 1.005–1.030)
Urobilinogen, Ur: 0.2 mg/dL (ref 0.2–1.0)
pH, UA: 6.5 (ref 5.0–7.5)

## 2022-09-05 LAB — MICROSCOPIC EXAMINATION: Epithelial Cells (non renal): >10 /hpf — AB (ref 0–10)

## 2022-09-05 NOTE — Progress Notes (Signed)
   09/05/22  CC:  Chief Complaint  Patient presents with   Cysto    HPI: Refer to my previous note 07/24/2022.  History of dysuria, frequency, urgency and negative urine cultures.  Noted significant symptom improvement on Uribel which she finished last week.  UA today with pyuria however >10 epis  Blood pressure (!) 193/92, pulse (!) 59, height '5\' 3"'$  (1.6 m), weight 119 lb (54 kg). NED. A&Ox3.   No respiratory distress   Abd soft, NT, ND Atrophic external genitalia with patent urethral meatus  Cystoscopy Procedure Note  Patient identification was confirmed, informed consent was obtained, and patient was prepped using Betadine solution.  Lidocaine jelly was administered per urethral meatus.    Procedure: - Flexible cystoscope introduced, without any difficulty.   - Thorough search of the bladder revealed:    normal urethral meatus    normal urothelium    no stones    no ulcers     no tumors    no urethral polyps    no trabeculation  - Ureteral orifices were normal in position and appearance.  Post-Procedure: - Patient tolerated the procedure well  Assessment/ Plan: No bladder mucosal abnormalities on cystoscopy Start low-dose vaginal estrogen pea-sized amount by fingertip twice weekly She will call back for refills on Uribel if needed PA follow-up 2 months for symptom recheck Urine was collected via cystoscopy and sent for culture    Abbie Sons, MD

## 2022-09-08 ENCOUNTER — Telehealth: Payer: Self-pay | Admitting: Urology

## 2022-09-08 LAB — CULTURE, URINE COMPREHENSIVE

## 2022-09-08 NOTE — Telephone Encounter (Signed)
Patient called the urology after hours nurse line on Saturday 11/4.  She reporting having symptoms of urinary frequency and vaginal discomfort.   Followed up with patient on  Monday to report that her urine culture was negative.   Patient reports that her symptoms have resolved and she is doing much better.

## 2022-09-09 ENCOUNTER — Telehealth: Payer: Self-pay

## 2022-09-09 NOTE — Progress Notes (Signed)
Chronic Care Management Pharmacy Assistant   Name: Patricia Peck  MRN: [id number] DOB: 06/06/1946  Reason for Encounter: Medication Review/General Adherence Call.   Recent office visits:  07/17/2022 Dr. Grandville Silos MD (PCP Office) Start cefdinir 300 MG capsule , Start cefTRIAXone (ROCEPHIN) injection 1 g 05/29/2022 Dr. Grandville Silos MD (PCP Office) Start acetaminophen 650 mg PRN 02/27/2022  [name] NP (PCP Office) start Pyridium  100 mg PRN, start tizanidine 4 mg as needed  02/24/2022 Wilfred Lacy NP (PCP)Start Macrobid 100 MG capsule, Ambulatory referral to Allergy , Return in about 4 weeks  01/23/2022 [name] NP (PCP Office) Start montelukast (SINGULAIR) 10 MG tablet, Follow-up in 4 weeks   Recent consult visits:  07/24/2022 Dr. Bernardo Heater MD (urology) Start Lynnda Shields  07/11/2022 Dr. Stanford Breed MD (Cardiology) Start amlodipine 5 mg once daily, Restart ezetimibe 10 MG ONCE DAILY  07/03/2022 Dr. Julien Girt MD- Unable to see note 06/13/2022 Jory Sims NP (Cardiology) Start zolpidem [facility]) 5 MG tablet PRN 01/28/2022 Dr. Stanford Breed MD (Cardiology) Increase Amlodipine to 10 mg daily  Hospital visits:  None in previous 6 months  Medications: Outpatient Encounter Medications as of 09/09/2022  Medication Sig   ADVAIR DISKUS 100-50 MCG/DOSE AEPB Inhale 1 puff into the lungs in the morning and at bedtime. Maintain upcoming appt for additional refill   albuterol (VENTOLIN HFA) 108 (90 Base) MCG/ACT inhaler Inhale 1 puff into the lungs every 6 (six) hours as needed.   Alpha-D-Galactosidase (BEANO PO) Take 1 tablet by mouth 2 (two) times daily.   amLODipine (NORVASC) 10 MG tablet Take 1 tablet (10 mg total) by mouth daily.   atorvastatin (LIPITOR) 80 MG tablet Take 1 tablet (80 mg total) by mouth daily.   Azelastine-Fluticasone 137-50 MCG/ACT SUSP Place 1 spray into both nostrils daily.   bumetanide (BUMEX) 1 MG tablet TAKE 1 TABLET(1 MG) BY MOUTH TWICE DAILY   Calcium Carbonate-Vitamin D  600-400 MG-UNIT tablet Take 1 tablet by mouth 2 (two) times daily.   Cholecalciferol (VITAMIN D-3) 5000 UNITS TABS Take 5,000 Units by mouth 3 (three) times a week. Tuesday Thursday Saturday   Cranberry 450 MG CAPS Take 2 capsules by mouth daily.   denosumab (PROLIA) 60 MG/ML SOLN injection Inject 60 mg into the skin once. Administer in upper arm, thigh, or abdomen (Patient taking differently: Inject 60 mg into the skin every 6 (six) months. Administer in upper arm, thigh, or abdomen)   estradiol (ESTRACE) 0.1 MG/GM vaginal cream Place 1 Applicatorful vaginally once a week.   ezetimibe (ZETIA) 10 MG tablet Take 1 tablet (10 mg total) by mouth daily.   levocetirizine (XYZAL) 5 MG tablet Take 1 tablet (5 mg total) by mouth daily.   levothyroxine (SYNTHROID) 50 MCG tablet TAKE 1 TABLET(50 MCG) BY MOUTH DAILY   Meth-Hyo-M Bl-Na Phos-Ph Sal (URIBEL) 118 MG CAPS Take 1 capsule (118 mg total) by mouth in the morning and at bedtime.   metoprolol succinate (TOPROL-XL) 50 MG 24 hr tablet Take 1 tablet (50 mg total) by mouth daily. Maintain upcoming appt for additional refill   montelukast (SINGULAIR) 10 MG tablet TAKE 1 TABLET(10 MG) BY MOUTH AT BEDTIME   naphazoline-pheniramine (EYE ALLERGY RELIEF) 0.025-0.3 % ophthalmic solution Place 1 drop into both eyes daily.   NEXIUM 40 MG capsule Take 1 capsule (40 mg total) by mouth daily at 12 noon.   OVER THE COUNTER MEDICATION Take 2 tablets by mouth daily. Bounty  Hair,Nail, and Skin daily gummies.   potassium chloride (KLOR-CON) 10 MEQ  tablet TAKE 1 TABLET(10 MEQ) BY MOUTH TWICE DAILY   Rivaroxaban (XARELTO) 15 MG TABS tablet Take 1 tablet (15 mg total) by mouth daily with supper.   rOPINIRole (REQUIP) 0.25 MG tablet Take 1 tablet (0.25 mg total) by mouth at bedtime.   tiZANidine (ZANAFLEX) 4 MG tablet Take 1 tablet (4 mg total) by mouth every 6 (six) hours as needed for muscle spasms.   traZODone (DESYREL) 50 MG tablet Take 1 tablet (50 mg total) by mouth at  bedtime.   Vibegron (GEMTESA) 75 MG TABS Take 1 tablet by mouth daily.   vitamin E 180 MG (400 UNITS) capsule Take 400 Units by mouth 3 (three) times a week. Tuesdays, Thursdays, and Saturdays   zolpidem (AMBIEN) 5 MG tablet Take 1 tablet (5 mg total) by mouth at bedtime as needed for sleep.   [DISCONTINUED] HYDROcodone-acetaminophen (NORCO/VICODIN) 5-325 MG tablet Take 1 tablet by mouth every 6 (six) hours as needed for moderate pain. (Patient not taking: No sig reported)   No facility-administered encounter medications on file as of 09/09/2022.    Care Gaps: COVID-19 Vaccine AWV  Star Rating Drugs: Atorvastatin 80 mg last filled 07/17/2022 90 day supply at [facility].  Called patient and discussed medication adherence  with patient, no issues at this time with current medication.   Patient Denies ED visit since her last CPP follow up.   Patient Denies  any side effects with her medication.  Patient Denies  any problems with hercurrent pharmacy  Patient reports she has had a lot of stuff going on since the passing of her husband.Patient reports she is taking it day by day, and agreed to schedule a follow up with the Clinical pharmacist.  Telephone follow up appointment with Care management team member scheduled for : 10/30/2022 at 2:00 pm.   [place] Pharmacist Assistant [phone]

## 2022-09-12 DIAGNOSIS — I4821 Permanent atrial fibrillation: Secondary | ICD-10-CM | POA: Diagnosis not present

## 2022-09-12 LAB — COMPREHENSIVE METABOLIC PANEL
ALT: 15 IU/L (ref 0–32)
AST: 15 IU/L (ref 0–40)
Albumin/Globulin Ratio: 2 (ref 1.2–2.2)
Albumin: 4.5 g/dL (ref 3.8–4.8)
Alkaline Phosphatase: 76 IU/L (ref 44–121)
BUN/Creatinine Ratio: 13 (ref 12–28)
BUN: 12 mg/dL (ref 8–27)
Bilirubin Total: 0.6 mg/dL (ref 0.0–1.2)
CO2: 26 mmol/L (ref 20–29)
Calcium: 9.4 mg/dL (ref 8.7–10.3)
Chloride: 104 mmol/L (ref 96–106)
Creatinine, Ser: 0.94 mg/dL (ref 0.57–1.00)
Globulin, Total: 2.3 g/dL (ref 1.5–4.5)
Glucose: 86 mg/dL (ref 70–99)
Potassium: 4 mmol/L (ref 3.5–5.2)
Sodium: 143 mmol/L (ref 134–144)
Total Protein: 6.8 g/dL (ref 6.0–8.5)
eGFR: 62 mL/min/{1.73_m2} (ref >59)

## 2022-09-12 LAB — LIPID PANEL
Chol/HDL Ratio: 3.3 ratio (ref 0.0–4.4)
Cholesterol, Total: 130 mg/dL (ref 100–199)
HDL: 40 mg/dL (ref >39)
LDL Chol Calc (NIH): 63 mg/dL (ref 0–99)
Triglycerides: 157 mg/dL — ABNORMAL HIGH (ref 0–149)
VLDL Cholesterol Cal: 27 mg/dL (ref 5–40)

## 2022-10-02 ENCOUNTER — Telehealth: Payer: Self-pay | Admitting: Nurse Practitioner

## 2022-10-02 NOTE — Telephone Encounter (Signed)
Left message for patient to call back and schedule Medicare Annual Wellness Visit (AWV) in office.   If not able to come in office, please offer to do virtually or by telephone.  Left office number and my jabber [phone].  Last AWV:10/09/2021   Please schedule at anytime with Nurse Health Advisor.

## 2022-10-04 ENCOUNTER — Other Ambulatory Visit: Payer: Self-pay | Admitting: Urology

## 2022-10-17 ENCOUNTER — Encounter: Payer: Self-pay | Admitting: Physician Assistant

## 2022-10-17 ENCOUNTER — Ambulatory Visit (INDEPENDENT_AMBULATORY_CARE_PROVIDER_SITE_OTHER): Payer: Medicare PPO | Admitting: Physician Assistant

## 2022-10-17 VITALS — BP 126/84 | HR 66 | Ht 63.0 in | Wt 119.0 lb

## 2022-10-17 DIAGNOSIS — R339 Retention of urine, unspecified: Secondary | ICD-10-CM | POA: Diagnosis not present

## 2022-10-17 DIAGNOSIS — R350 Frequency of micturition: Secondary | ICD-10-CM | POA: Diagnosis not present

## 2022-10-17 DIAGNOSIS — R300 Dysuria: Secondary | ICD-10-CM

## 2022-10-17 LAB — URINALYSIS, COMPLETE
Bilirubin, UA: NEGATIVE
Glucose, UA: NEGATIVE
Ketones, UA: NEGATIVE
Nitrite, UA: NEGATIVE
Protein,UA: NEGATIVE
RBC, UA: NEGATIVE
Specific Gravity, UA: 1.01 (ref 1.005–1.030)
Urobilinogen, Ur: 0.2 mg/dL (ref 0.2–1.0)
pH, UA: 6 (ref 5.0–7.5)

## 2022-10-17 LAB — BLADDER SCAN AMB NON-IMAGING

## 2022-10-17 LAB — MICROSCOPIC EXAMINATION: WBC, UA: >30 /hpf — AB (ref 0–5)

## 2022-10-17 MED ORDER — SULFAMETHOXAZOLE-TRIMETHOPRIM 800-160 MG PO TABS: 1.0000 | ORAL_TABLET | Freq: Two times a day (BID) | ORAL | 0 refills | Status: AC

## 2022-10-17 NOTE — Progress Notes (Signed)
10/17/2022 5:05 PM   Patricia Peck 12/11/1945 [id number]  CC: Chief Complaint  Patient presents with   Urinary Tract Infection   HPI: Patricia Peck is a 76 y.o. female with PMH chronic dysuria, frequency, urgency, vaginal burning, and occasional UTI on Gemtesa who presents today for evaluation of possible UTI.   Today she reports ongoing dysuria and malodorous urine that has not been resolved with Uribel.  She denies fever, chills, nausea, or vomiting.  Bladder scan with 257 mL.  She notes that she takes bumetanide twice daily and has noticed less urinary output when she takes this over the past several months.  She has been using vaginal estrogen cream twice weekly and taking Uribel for symptom control of her dysuria.  She denies a history of CVA, diabetes, MS, Parkinson's, or other neurodegenerative disorders.  In-office catheterized UA today positive for 1+ leukocytes; urine microscopy with >30 WBCs/HPF and many bacteria.  Measured residual 400 mL.  PMH: Past Medical History:  Diagnosis Date   Allergy    Arthritis    hands   Asthma    uses an inhaler   Atrial fibrillation (HCC)    Atrial flutter (HCC)    Depression    Dysrhythmia    a-fib, on Xarelto   GERD (gastroesophageal reflux disease)    Hyperlipidemia    Hypertension    Hypothyroidism    Osteoporosis    Stroke ([place]) 2017   TIA   TIA (transient ischemic attack)     Surgical History: Past Surgical History:  Procedure Laterality Date   ABDOMINAL HYSTERECTOMY     COLONOSCOPY     MYRINGOTOMY WITH TUBE PLACEMENT Right 01/14/2016   Procedure: RIGHT MYRINGOTOMY WITH T-TUBE PLACEMENT;  Surgeon: Leta Baptist, MD;  Location: [facility];  Service: ENT;  Laterality: Right;   [place] OOPHERECTOMY Bilateral 02/14/2021   Procedure: XI ROBOTIC ASSISTED BILATERAL SALPINGO OOPHORECTOMY;  Surgeon: Everitt Amber, MD;  Location: [facility];  Service:  Gynecology;  Laterality: Bilateral;   TEE WITHOUT CARDIOVERSION N/A 12/22/2012   Procedure: TRANSESOPHAGEAL ECHOCARDIOGRAM (TEE);  Surgeon: Lelon Perla, MD;  Location: [facility];  Service: Cardiovascular;  Laterality: N/A;    Home Medications:  Allergies as of 10/17/2022   No Known Allergies      Medication List        Accurate as of October 17, 2022  5:05 PM. If you have any questions, ask your nurse or doctor.          Advair Diskus 100-50 MCG/ACT Aepb Generic drug: fluticasone-salmeterol Inhale 1 puff into the lungs in the morning and at bedtime. Maintain upcoming appt for additional refill   albuterol 108 (90 Base) MCG/ACT inhaler Commonly known as: VENTOLIN HFA Inhale 1 puff into the lungs every 6 (six) hours as needed.   amLODipine 10 MG tablet Commonly known as: NORVASC Take 1 tablet (10 mg total) by mouth daily.   atorvastatin 80 MG tablet Commonly known as: LIPITOR Take 1 tablet (80 mg total) by mouth daily.   Azelastine-Fluticasone 137-50 MCG/ACT Susp Place 1 spray into both nostrils daily.   BEANO PO Take 1 tablet by mouth 2 (two) times daily.   bumetanide 1 MG tablet Commonly known as: BUMEX TAKE 1 TABLET(1 MG) BY MOUTH TWICE DAILY   Calcium Carbonate-Vitamin D 600-400 MG-UNIT tablet Take 1 tablet by mouth 2 (two) times daily.   Cranberry 450 MG Caps Take 2 capsules by mouth  daily.   denosumab 60 MG/ML Soln injection Commonly known as: PROLIA Inject 60 mg into the skin once. Administer in upper arm, thigh, or abdomen What changed: when to take this   estradiol 0.1 MG/GM vaginal cream Commonly known as: ESTRACE Place 1 Applicatorful vaginally once a week.   Eye Allergy Relief 0.025-0.3 % ophthalmic solution Generic drug: naphazoline-pheniramine Place 1 drop into both eyes daily.   ezetimibe 10 MG tablet Commonly known as: ZETIA Take 1 tablet (10 mg total) by mouth daily.   Gemtesa 75 MG Tabs Generic drug: Vibegron Take 1  tablet by mouth daily.   levocetirizine 5 MG tablet Commonly known as: XYZAL Take 1 tablet (5 mg total) by mouth daily.   levothyroxine 50 MCG tablet Commonly known as: SYNTHROID TAKE 1 TABLET(50 MCG) BY MOUTH DAILY   metoprolol succinate 50 MG 24 hr tablet Commonly known as: TOPROL-XL Take 1 tablet (50 mg total) by mouth daily. Maintain upcoming appt for additional refill   montelukast 10 MG tablet Commonly known as: SINGULAIR TAKE 1 TABLET(10 MG) BY MOUTH AT BEDTIME   NexIUM 40 MG capsule Generic drug: esomeprazole Take 1 capsule (40 mg total) by mouth daily at 12 noon.   OVER THE COUNTER MEDICATION Take 2 tablets by mouth daily. Bounty  Hair,Nail, and Skin daily gummies.   potassium chloride 10 MEQ tablet Commonly known as: KLOR-CON TAKE 1 TABLET(10 MEQ) BY MOUTH TWICE DAILY   Rivaroxaban 15 MG Tabs tablet Commonly known as: XARELTO Take 1 tablet (15 mg total) by mouth daily with supper.   rOPINIRole 0.25 MG tablet Commonly known as: Requip Take 1 tablet (0.25 mg total) by mouth at bedtime.   sulfamethoxazole-trimethoprim 800-160 MG tablet Commonly known as: BACTRIM DS Take 1 tablet by mouth 2 (two) times daily for 7 days. Started by: Debroah Loop, PA-C   tiZANidine 4 MG tablet Commonly known as: Zanaflex Take 1 tablet (4 mg total) by mouth every 6 (six) hours as needed for muscle spasms.   traZODone 50 MG tablet Commonly known as: DESYREL Take 1 tablet (50 mg total) by mouth at bedtime.   Uro-MP 118 MG Caps TAKE 2 CAPSULES DAILY BY MOUTH; IN THE MORNING AND AT BEDTIME   Vitamin D-3 125 MCG (5000 UT) Tabs Take 5,000 Units by mouth 3 (three) times a week. Tuesday Thursday Saturday   vitamin E 180 MG (400 UNITS) capsule Take 400 Units by mouth 3 (three) times a week. Tuesdays, Thursdays, and Saturdays   zolpidem 5 MG tablet Commonly known as: Ambien Take 1 tablet (5 mg total) by mouth at bedtime as needed for sleep.        Allergies:  No  Known Allergies  Family History: Family History  Problem Relation Age of Onset   Breast cancer Mother    Drug abuse Son    Depression Son    Stroke Paternal Aunt    Heart disease Paternal Grandfather    Diabetes Maternal Grandfather    Colon cancer Neg Hx    Ovarian cancer Neg Hx    Endometrial cancer Neg Hx    Pancreatic cancer Neg Hx    Prostate cancer Neg Hx     Social History:   reports that she has never smoked. She has never been exposed to tobacco smoke. She has never used smokeless tobacco. She reports current alcohol use. She reports that she does not use drugs.  Physical Exam: BP 126/84   Pulse 66   Ht '5\' 3"'$  (1.6 m)  Wt 119 lb (54 kg)   BMI 21.08 kg/m   Constitutional:  Alert and oriented, no acute distress, nontoxic appearing HEENT: [place], AT Cardiovascular: No clubbing, cyanosis, or edema Respiratory: Normal respiratory effort, no increased work of breathing Skin: No rashes, bruises or suspicious lesions Neurologic: Grossly intact, no focal deficits, moving all 4 extremities Psychiatric: Normal mood and affect  Laboratory Data: Results for orders placed or performed in visit on 10/17/22  Microscopic Examination   Urine  Result Value Ref Range   WBC, UA >30 (A) 0 - 5 /hpf   RBC, Urine 0-2 0 - 2 /hpf   Epithelial Cells (non renal) 0-10 0 - 10 /hpf   Bacteria, UA Many (A) None seen/Few  Urinalysis, Complete  Result Value Ref Range   Specific Gravity, UA 1.010 1.005 - 1.030   pH, UA 6.0 5.0 - 7.5   Color, UA Yellow Yellow   Appearance Ur Hazy (A) Clear   Leukocytes,UA 1+ (A) Negative   Protein,UA Negative Negative/Trace   Glucose, UA Negative Negative   Ketones, UA Negative Negative   RBC, UA Negative Negative   Bilirubin, UA Negative Negative   Urobilinogen, Ur 0.2 0.2 - 1.0 mg/dL   Nitrite, UA Negative Negative   Microscopic Examination See below:   BLADDER SCAN AMB NON-IMAGING  Result Value Ref Range   Scan Result 251m    Assessment & Plan:    1. Dysuria Incidental finding of incomplete bladder emptying today, see below.  Additionally, her urine does appear infected.  Will start empiric Bactrim and send for culture for further evaluation.  We discussed that her dysuria is likely related to acute cystitis today, however at baseline I think it is related to her incomplete bladder emptying, see below. - Urinalysis, Complete - CULTURE, URINE COMPREHENSIVE - BLADDER SCAN AMB NON-IMAGING - sulfamethoxazole-trimethoprim (BACTRIM DS) 800-160 MG tablet; Take 1 tablet by mouth 2 (two) times daily for 7 days.  Dispense: 14 tablet; Refill: 0  2. Incomplete bladder emptying We discussed that I am unsure if she is incompletely emptying right now because she has a bladder infection or if this is typical for her at baseline.  Will treat her for acute cystitis as above and plan to see her back in 3 weeks with a repeat cath UA, measured residual, and symptom recheck.  I asked her to stop Gemtesa and continue vaginal estrogen in the interim.  If her residual remains elevated at her follow-up, we will have her see Dr. MMatilde Sprangfor consideration of urodynamics.  She is in agreement with this plan.    Return in about 3 weeks (around 11/07/2022) for Sx recheck and PVR + 6 weeks for incomplete bladder emptying visit with Dr. MMatilde Sprang  SDebroah Loop PA-C  [facility] [address] SPasadena HillsBMadison [place] [phone]

## 2022-10-17 NOTE — Patient Instructions (Signed)
Continue vaginal estrogen cream 3 times weekly (Monday, Wednesday, Friday). Continue Uribel as needed for painful urination. Stop Gemtesa (vibegron). Start the antibiotic Bactrim, which I sent to your pharmacy today.  I'll send your urine for culture today and call you if I need to switch your antibiotics based on those results. I'll see you back in clinic in January and we will recheck your urinary symptoms at that time. Based on that visit, we may have you come in to meet with Dr. Matilde Sprang to discuss additional testing/treatment options.

## 2022-10-19 ENCOUNTER — Other Ambulatory Visit: Payer: Self-pay | Admitting: Nurse Practitioner

## 2022-10-19 DIAGNOSIS — G2581 Restless legs syndrome: Secondary | ICD-10-CM

## 2022-10-20 NOTE — Telephone Encounter (Signed)
Chart supports Rx Last OV: 07/2022 Next OV: 10/2022

## 2022-10-21 ENCOUNTER — Ambulatory Visit (INDEPENDENT_AMBULATORY_CARE_PROVIDER_SITE_OTHER): Payer: Medicare PPO

## 2022-10-21 VITALS — Ht 63.0 in | Wt 119.0 lb

## 2022-10-21 DIAGNOSIS — Z0000 Encounter for general adult medical examination without abnormal findings: Secondary | ICD-10-CM

## 2022-10-21 LAB — CULTURE, URINE COMPREHENSIVE

## 2022-10-21 NOTE — Patient Instructions (Addendum)
Patricia Peck , Thank you for taking time to come for your Medicare Wellness Visit. I appreciate your ongoing commitment to your health goals. Please review the following plan we discussed and let me know if I can assist you in the future.   These are the goals we discussed:  Goals      Increase physical activity     Patient Stated     10/21/2022, no goals     Track and Manage My Blood Pressure-Hypertension     Timeframe:  Long-Range Goal Priority:  High Start Date: 10/17/2021                            Expected End Date:   10/17/2022                    Follow Up within 90 days   - check blood pressure weekly    Why is this important?   You won't feel high blood pressure, but it can still hurt your blood vessels.  High blood pressure can cause heart or kidney problems. It can also cause a stroke.  Making lifestyle changes like losing a little weight or eating less salt will help.  Checking your blood pressure at home and at different times of the day can help to control blood pressure.  If the doctor prescribes medicine remember to take it the way the doctor ordered.  Call the office if you cannot afford the medicine or if there are questions about it.     Notes:         This is a list of the screening recommended for you and due dates:  Health Maintenance  Topic Date Due   COVID-19 Vaccine (6 - 2023-24 season) 07/04/2022   Mammogram  07/12/2023   Medicare Annual Wellness Visit  10/22/2023   DTaP/Tdap/Td vaccine (3 - Tdap) 04/16/2025   Pneumonia Vaccine  Completed   Flu Shot  Completed   DEXA scan (bone density measurement)  Completed   Hepatitis C Screening: USPSTF Recommendation to screen - Ages 44-79 yo.  Completed   Zoster (Shingles) Vaccine  Completed   HPV Vaccine  Aged Out   Colon Cancer Screening  Discontinued    Advanced directives: Please bring a copy of your POA (Power of Attorney) and/or Living Will to your next appointment.   Conditions/risks identified:  none  Next appointment: Follow up in one year for your annual wellness visit    Preventive Care 65 Years and Older, Female Preventive care refers to lifestyle choices and visits with your health care provider that can promote health and wellness. What does preventive care include? A yearly physical exam. This is also called an annual well check. Dental exams once or twice a year. Routine eye exams. Ask your health care provider how often you should have your eyes checked. Personal lifestyle choices, including: Daily care of your teeth and gums. Regular physical activity. Eating a healthy diet. Avoiding tobacco and drug use. Limiting alcohol use. Practicing safe sex. Taking low-dose aspirin every day. Taking vitamin and mineral supplements as recommended by your health care provider. What happens during an annual well check? The services and screenings done by your health care provider during your annual well check will depend on your age, overall health, lifestyle risk factors, and family history of disease. Counseling  Your health care provider may ask you questions about your: Alcohol use. Tobacco use. Drug use. Emotional  well-being. Home and relationship well-being. Sexual activity. Eating habits. History of falls. Memory and ability to understand (cognition). Work and work Statistician. Reproductive health. Screening  You may have the following tests or measurements: Height, weight, and BMI. Blood pressure. Lipid and cholesterol levels. These may be checked every 5 years, or more frequently if you are over 27 years old. Skin check. Lung cancer screening. You may have this screening every year starting at age 57 if you have a 30-pack-year history of smoking and currently smoke or have quit within the past 15 years. Fecal occult blood test (FOBT) of the stool. You may have this test every year starting at age 21. Flexible sigmoidoscopy or colonoscopy. You may have a  sigmoidoscopy every 5 years or a colonoscopy every 10 years starting at age 32. Hepatitis C blood test. Hepatitis B blood test. Sexually transmitted disease (STD) testing. Diabetes screening. This is done by checking your blood sugar (glucose) after you have not eaten for a while (fasting). You may have this done every 1-3 years. Bone density scan. This is done to screen for osteoporosis. You may have this done starting at age 51. Mammogram. This may be done every 1-2 years. Talk to your health care provider about how often you should have regular mammograms. Talk with your health care provider about your test results, treatment options, and if necessary, the need for more tests. Vaccines  Your health care provider may recommend certain vaccines, such as: Influenza vaccine. This is recommended every year. Tetanus, diphtheria, and acellular pertussis (Tdap, Td) vaccine. You may need a Td booster every 10 years. Zoster vaccine. You may need this after age 77. Pneumococcal 13-valent conjugate (PCV13) vaccine. One dose is recommended after age 22. Pneumococcal polysaccharide (PPSV23) vaccine. One dose is recommended after age 17. Talk to your health care provider about which screenings and vaccines you need and how often you need them. This information is not intended to replace advice given to you by your health care provider. Make sure you discuss any questions you have with your health care provider. Document Released: 11/16/2015 Document Revised: 07/09/2016 Document Reviewed: 08/21/2015 Elsevier Interactive Patient Education  2017 [place] Prevention in the Home Falls can cause injuries. They can happen to people of all ages. There are many things you can do to make your home safe and to help prevent falls. What can I do on the outside of my home? Regularly fix the edges of walkways and driveways and fix any cracks. Remove anything that might make you trip as you walk through a  door, such as a raised step or threshold. Trim any bushes or trees on the path to your home. Use bright outdoor lighting. Clear any walking paths of anything that might make someone trip, such as rocks or tools. Regularly check to see if handrails are loose or broken. Make sure that both sides of any steps have handrails. Any raised decks and porches should have guardrails on the edges. Have any leaves, snow, or ice cleared regularly. Use sand or salt on walking paths during winter. Clean up any spills in your garage right away. This includes oil or grease spills. What can I do in the bathroom? Use night lights. Install grab bars by the toilet and in the tub and shower. Do not use towel bars as grab bars. Use non-skid mats or decals in the tub or shower. If you need to sit down in the shower, use a plastic, non-slip stool. Keep  the floor dry. Clean up any water that spills on the floor as soon as it happens. Remove soap buildup in the tub or shower regularly. Attach bath mats securely with double-sided non-slip rug tape. Do not have throw rugs and other things on the floor that can make you trip. What can I do in the bedroom? Use night lights. Make sure that you have a light by your bed that is easy to reach. Do not use any sheets or blankets that are too big for your bed. They should not hang down onto the floor. Have a firm chair that has side arms. You can use this for support while you get dressed. Do not have throw rugs and other things on the floor that can make you trip. What can I do in the kitchen? Clean up any spills right away. Avoid walking on wet floors. Keep items that you use a lot in easy-to-reach places. If you need to reach something above you, use a strong step stool that has a grab bar. Keep electrical cords out of the way. Do not use floor polish or wax that makes floors slippery. If you must use wax, use non-skid floor wax. Do not have throw rugs and other things  on the floor that can make you trip. What can I do with my stairs? Do not leave any items on the stairs. Make sure that there are handrails on both sides of the stairs and use them. Fix handrails that are broken or loose. Make sure that handrails are as long as the stairways. Check any carpeting to make sure that it is firmly attached to the stairs. Fix any carpet that is loose or worn. Avoid having throw rugs at the top or bottom of the stairs. If you do have throw rugs, attach them to the floor with carpet tape. Make sure that you have a light switch at the top of the stairs and the bottom of the stairs. If you do not have them, ask someone to add them for you. What else can I do to help prevent falls? Wear shoes that: Do not have high heels. Have rubber bottoms. Are comfortable and fit you well. Are closed at the toe. Do not wear sandals. If you use a stepladder: Make sure that it is fully opened. Do not climb a closed stepladder. Make sure that both sides of the stepladder are locked into place. Ask someone to hold it for you, if possible. Clearly mark and make sure that you can see: Any grab bars or handrails. First and last steps. Where the edge of each step is. Use tools that help you move around (mobility aids) if they are needed. These include: Canes. Walkers. Scooters. Crutches. Turn on the lights when you go into a dark area. Replace any light bulbs as soon as they burn out. Set up your furniture so you have a clear path. Avoid moving your furniture around. If any of your floors are uneven, fix them. If there are any pets around you, be aware of where they are. Review your medicines with your doctor. Some medicines can make you feel dizzy. This can increase your chance of falling. Ask your doctor what other things that you can do to help prevent falls. This information is not intended to replace advice given to you by your health care provider. Make sure you discuss any  questions you have with your health care provider. Document Released: 08/16/2009 Document Revised: 03/27/2016 Document Reviewed: 11/24/2014 [employer]  Education  2017 [employer]  

## 2022-10-21 NOTE — Progress Notes (Signed)
I connected with Patricia Peck today by telephone and verified that I am speaking with the correct person using two identifiers. Location patient: home Location provider: work Persons participating in the virtual visit: Denine Brotz, Glenna Durand LPN.   I discussed the limitations, risks, security and privacy concerns of performing an evaluation and management service by telephone and the availability of in person appointments. I also discussed with the patient that there may be a patient responsible charge related to this service. The patient expressed understanding and verbally consented to this telephonic visit.    Interactive audio and video telecommunications were attempted between this provider and patient, however failed, due to patient having technical difficulties OR patient did not have access to video capability.  We continued and completed visit with audio only.     Vital signs may be patient reported or missing.  Subjective:   Patricia Peck is a 76 y.o. female who presents for Medicare Annual (Subsequent) preventive examination.  Review of Systems     Cardiac Risk Factors include: advanced age (>37mn, >>36women);hypertension     Objective:    Today's Vitals   10/21/22 1541  Weight: 119 lb (54 kg)  Height: '5\' 3"'$  (1.6 m)   Body mass index is 21.08 kg/m.     10/21/2022    3:49 PM 06/24/2022   11:30 AM 10/09/2021   11:32 AM 03/06/2021    5:03 PM 02/12/2021    8:24 AM 02/05/2021    2:42 PM 12/04/2020    4:54 PM  Advanced Directives  Does Patient Have a Medical Advance Directive? Yes Yes Yes Yes Yes Yes Yes  Type of AParamedicof [place]Living will [place]Living will [place]irch CreekLiving will [place]Living will [place]Living will [place]ry[place]Living will   Copy of HNew Fre[place] Chart? No - copy requested  No - copy requested No -  copy requested  No - copy requested     Current Medications (verified) Outpatient Encounter Medications as of 10/21/2022  Medication Sig   ADVAIR DISKUS 100-50 MCG/DOSE AEPB Inhale 1 puff into the lungs in the morning and at bedtime. Maintain upcoming appt for additional refill   albuterol (VENTOLIN HFA) 108 (90 Base) MCG/ACT inhaler Inhale 1 puff into the lungs every 6 (six) hours as needed.   Alpha-D-Galactosidase (BEANO PO) Take 1 tablet by mouth 2 (two) times daily.   amLODipine (NORVASC) 10 MG tablet Take 1 tablet (10 mg total) by mouth daily.   atorvastatin (LIPITOR) 80 MG tablet Take 1 tablet (80 mg total) by mouth daily.   Azelastine-Fluticasone 137-50 MCG/ACT SUSP Place 1 spray into both nostrils daily.   bumetanide (BUMEX) 1 MG tablet TAKE 1 TABLET(1 MG) BY MOUTH TWICE DAILY   Calcium Carbonate-Vitamin D 600-400 MG-UNIT tablet Take 1 tablet by mouth 2 (two) times daily.   Cholecalciferol (VITAMIN D-3) 5000 UNITS TABS Take 5,000 Units by mouth 3 (three) times a week. Tuesday Thursday Saturday   Cranberry 450 MG CAPS Take 2 capsules by mouth daily.   denosumab (PROLIA) 60 MG/ML SOLN injection Inject 60 mg into the skin once. Administer in upper arm, thigh, or abdomen (Patient taking differently: Inject 60 mg into the skin every 6 (six) months. Administer in upper arm, thigh, or abdomen)   estradiol (ESTRACE) 0.1 MG/GM vaginal cream Place 1 Applicatorful vaginally 3 (three) times a week.   ezetimibe (ZETIA) 10 MG tablet Take 1 tablet (  10 mg total) by mouth daily.   levocetirizine (XYZAL) 5 MG tablet Take 1 tablet (5 mg total) by mouth daily.   levothyroxine (SYNTHROID) 50 MCG tablet TAKE 1 TABLET(50 MCG) BY MOUTH DAILY   Meth-Hyo-M Bl-Na Phos-Ph Sal (URO-MP) 118 MG CAPS TAKE 2 CAPSULES DAILY BY MOUTH; IN THE MORNING AND AT BEDTIME   metoprolol succinate (TOPROL-XL) 50 MG 24 hr tablet Take 1 tablet (50 mg total) by mouth daily. Maintain upcoming appt for additional refill   montelukast  (SINGULAIR) 10 MG tablet TAKE 1 TABLET(10 MG) BY MOUTH AT BEDTIME   naphazoline-pheniramine (EYE ALLERGY RELIEF) 0.025-0.3 % ophthalmic solution Place 1 drop into both eyes daily.   NEXIUM 40 MG capsule Take 1 capsule (40 mg total) by mouth daily at 12 noon.   OVER THE COUNTER MEDICATION Take 2 tablets by mouth daily. Bounty  Hair,Nail, and Skin daily gummies.   potassium chloride (KLOR-CON) 10 MEQ tablet TAKE 1 TABLET(10 MEQ) BY MOUTH TWICE DAILY   Rivaroxaban (XARELTO) 15 MG TABS tablet Take 1 tablet (15 mg total) by mouth daily with supper.   rOPINIRole (REQUIP) 0.25 MG tablet TAKE 1 TABLET(0.25 MG) BY MOUTH AT BEDTIME   sulfamethoxazole-trimethoprim (BACTRIM DS) 800-160 MG tablet Take 1 tablet by mouth 2 (two) times daily for 7 days.   tiZANidine (ZANAFLEX) 4 MG tablet Take 1 tablet (4 mg total) by mouth every 6 (six) hours as needed for muscle spasms.   traZODone (DESYREL) 50 MG tablet Take 1 tablet (50 mg total) by mouth at bedtime.   vitamin E 180 MG (400 UNITS) capsule Take 400 Units by mouth 3 (three) times a week. Tuesdays, Thursdays, and Saturdays   zolpidem (AMBIEN) 5 MG tablet Take 1 tablet (5 mg total) by mouth at bedtime as needed for sleep.   Vibegron (GEMTESA) 75 MG TABS Take 1 tablet by mouth daily. (Patient not taking: Reported on 10/21/2022)   [DISCONTINUED] HYDROcodone-acetaminophen (NORCO/VICODIN) 5-325 MG tablet Take 1 tablet by mouth every 6 (six) hours as needed for moderate pain. (Patient not taking: No sig reported)   No facility-administered encounter medications on file as of 10/21/2022.    Allergies (verified) Patient has no known allergies.   History: Past Medical History:  Diagnosis Date   Allergy    Arthritis    hands   Asthma    uses an inhaler   Atrial fibrillation (HCC)    Atrial flutter (HCC)    Depression    Dysrhythmia    a-fib, on Xarelto   GERD (gastroesophageal reflux disease)    Hyperlipidemia    Hypertension    Hypothyroidism     Osteoporosis    Stroke ([place]) 2017   TIA   TIA (transient ischemic attack)    Past Surgical History:  Procedure Laterality Date   ABDOMINAL HYSTERECTOMY     COLONOSCOPY     MYRINGOTOMY WITH TUBE PLACEMENT Right 01/14/2016   Procedure: RIGHT MYRINGOTOMY WITH T-TUBE PLACEMENT;  Surgeon: Leta Baptist, MD;  Location: [facility];  Service: ENT;  Laterality: Right;   [place] OOPHERECTOMY Bilateral 02/14/2021   Procedure: XI ROBOTIC ASSISTED BILATERAL SALPINGO OOPHORECTOMY;  Surgeon: Everitt Amber, MD;  Location: [facility];  Service: Gynecology;  Laterality: Bilateral;   TEE WITHOUT CARDIOVERSION N/A 12/22/2012   Procedure: TRANSESOPHAGEAL ECHOCARDIOGRAM (TEE);  Surgeon: Lelon Perla, MD;  Location: [facility] ENDOSCOPY;  Service: Cardiovascular;  Laterality: N/A;   Family History  Problem Relation Age of Onset  Breast cancer Mother    Drug abuse Son    Depression Son    Stroke Paternal Aunt    Heart disease Paternal Grandfather    Diabetes Maternal Grandfather    Colon cancer Neg Hx    Ovarian cancer Neg Hx    Endometrial cancer Neg Hx    Pancreatic cancer Neg Hx    Prostate cancer Neg Hx    Social History   Socioeconomic History   Marital status: Widowed    Spouse name: Not on file   Number of children: 2   Years of education: Not on file   Highest education level: Not on file  Occupational History    Employer: RETIRED  Tobacco Use   Smoking status: Never    Passive exposure: Never   Smokeless tobacco: Never  Vaping Use   Vaping Use: Never used  Substance and Sexual Activity   Alcohol use: Yes    Comment: Rare   Drug use: No   Sexual activity: Not Currently  Other Topics Concern   Not on file  Social History Narrative   Does have HPOA- sons Eddie Dibbles and Wille Glaser.   Does desire life support if not futile.   Not sure about feeding tubes.   Social Determinants of Health   Financial Resource Strain: Low Risk   (10/21/2022)   Overall Financial Resource Strain (CARDIA)    Difficulty of Paying Living Expenses: Not hard at all  Food Insecurity: No Food Insecurity (10/21/2022)   Hunger Vital Sign    Worried About Running Out of Food in the Last Year: Never true    Ran Out of Food in the Last Year: Never true  Transportation Needs: No Transportation Needs (10/21/2022)   PRAPARE - Hydrologist (Medical): No    Lack of Transportation (Non-Medical): No  Physical Activity: Inactive (10/21/2022)   Exercise Vital Sign    Days of Exercise per Week: 0 days    Minutes of Exercise per Session: 0 min  Stress: No Stress Concern Present (10/21/2022)   [place]    Feeling of Stress : Not at all  Social Connections: Moderately Isolated (10/09/2021)   Social Connection and Isolation Panel [NHANES]    Frequency of Communication with Friends and Family: Twice a week    Frequency of Social Gatherings with Friends and Family: Twice a week    Attends Religious Services: Never    Marine scientist or Organizations: No    Attends Music therapist: Never    Marital Status: Married    Tobacco Counseling Counseling given: Not Answered   Clinical Intake:  Pre-visit preparation completed: Yes  Pain : No/denies pain     Nutritional Status: BMI of 19-24  Normal Nutritional Risks: None Diabetes: No  How often do you need to have someone help you when you read instructions, pamphlets, or other written materials from your doctor or pharmacy?: 1 - Never  Diabetic? no  Interpreter Needed?: No  Information entered by :: NAllen LPN   Activities of Daily Living    10/21/2022    3:50 PM  In your present state of health, do you have any difficulty performing the following activities:  Hearing? 0  Vision? 0  Difficulty concentrating or making decisions? 0  Walking or climbing stairs? 0  Dressing  or bathing? 0  Doing errands, shopping? 0  Preparing Food and eating ? N  Using the Toilet? N  In the past six months, have you accidently leaked urine? N  Do you have problems with loss of bowel control? N  Managing your Medications? N  Managing your Finances? N  Housekeeping or managing your Housekeeping? N    Patient Care Team: Nche, Charlene Brooke, NP as PCP - General (Internal Medicine) Stanford Breed Denice Bors, MD as PCP - Cardiology (Cardiology) Stanford Breed Denice Bors, MD as Consulting Physician (Cardiology) Leta Baptist, MD as Consulting Physician (Otolaryngology) Mosetta Anis, MD as Referring Physician (Allergy) Germaine Pomfret, [facility] as Pharmacist (Pharmacist)  Indicate any recent Medical Services you may have received from other than [facility] providers in the past year (date may be approximate).     Assessment:   This is a routine wellness examination for Tahani.  Hearing/Vision screen Vision Screening - Comments:: Regular eye exams, Dr. Gwyndolyn Kaufman  Dietary issues and exercise activities discussed: Current Exercise Habits: The patient does not participate in regular exercise at present   Goals Addressed             This Visit's Progress    Patient Stated       10/21/2022, no goals       Depression Screen    10/21/2022    3:50 PM 06/24/2022   11:32 AM 10/09/2021   11:32 AM 10/09/2021   11:30 AM 07/16/2021   10:21 AM 07/20/2020    9:42 AM 06/21/2020   10:20 AM  PHQ 2/9 Scores  PHQ - 2 Score 0 1 0 0 0 0 0    Fall Risk    10/21/2022    3:49 PM 05/29/2022    2:27 PM 10/09/2021   11:32 AM 07/16/2021   10:20 AM 06/21/2020   10:20 AM  Fall Risk   Falls in the past year? 0 1 0 0 0  Number falls in past yr: 0 0 0 0 0  Injury with Fall? 0 1 0 0 0  Risk for fall due to : Medication side effect   No Fall Risks   Follow up Falls prevention discussed;Education provided;Falls evaluation completed  Falls evaluation completed Falls evaluation completed Falls prevention discussed     FALL RISK PREVENTION PERTAINING TO THE HOME:  Any stairs in or around the home? Yes  If so, are there any without handrails? No  Home free of loose throw rugs in walkways, pet beds, electrical cords, etc? Yes  Adequate lighting in your home to reduce risk of falls? Yes   ASSISTIVE DEVICES UTILIZED TO PREVENT FALLS:  Life alert? No  Use of a cane, walker or w/c? No  Grab bars in the bathroom? No  Shower chair or bench in shower? Yes  Elevated toilet seat or a handicapped toilet? No   TIMED UP AND GO:  Was the test performed? No .      Cognitive Function:    04/21/2018    2:33 PM 04/21/2017    2:20 PM  MMSE - Mini Mental State Exam  Orientation to time 5 5  Orientation to Place 5 5  Registration 3 3  Attention/ Calculation 5 0  Recall 3 1  Recall-comments  pt was unable to recall 2 of 3 words  Language- name 2 objects 2 0  Language- repeat 1 1  Language- follow 3 step command 3 3  Language- read & follow direction 1 0  Write a sentence 1 0  Copy design 1 0  Total score 30 18        10/21/2022  3:51 PM  6CIT Screen  What Year? 0 points  What month? 0 points  What time? 0 points  Count back from 20 0 points  Months in reverse 0 points  Repeat phrase 0 points  Total Score 0 points    Immunizations Immunization History  Administered Date(s) Administered   Fluad Quad(high Dose 65+) 06/30/2019, 07/19/2022   Influenza Split 09/04/2011, 08/11/2012, 08/24/2013, 07/20/2015, 07/29/2016, 08/04/2017, 07/10/2018   Influenza Whole 08/07/2009   Influenza, High Dose Seasonal PF 09/26/2013, 08/28/2014, 11/27/2016, 12/10/2017, 11/18/2018, 06/30/2019, 11/15/2019, 11/13/2020, 11/14/2021   Influenza,inj,Quad PF,6+ Mos 08/24/2013, 07/20/2015, 07/29/2016, 08/04/2017   Influenza-Unspecified 07/10/2018, 07/14/2020   PFIZER(Purple Top)SARS-COV-2 Vaccination 11/23/2019, 01/04/2020, 02/02/2020, 04/05/2020, 08/30/2020   Pneumococcal Conjugate-13 04/12/2014   Pneumococcal  Polysaccharide-23 02/05/2006, 02/05/2006, 04/08/2012, 04/08/2012, 04/22/2016, 11/27/2016, 12/10/2017, 11/18/2018, 11/15/2019, 04/05/2020, 11/13/2020, 11/14/2021   Td 01/29/2005, 04/17/2015   Zoster Recombinat (Shingrix) 02/09/2018, 05/11/2020   Zoster, Live 04/16/2007   Zoster, Unspecified 02/09/2018    TDAP status: Up to date  Flu Vaccine status: Up to date  Pneumococcal vaccine status: Up to date  Covid-19 vaccine status: Completed vaccines  Qualifies for Shingles Vaccine? Yes   Zostavax completed Yes   Shingrix Completed?: Yes  Screening Tests Health Maintenance  Topic Date Due   COVID-19 Vaccine (6 - 2023-24 season) 07/04/2022   MAMMOGRAM  07/12/2023   Medicare Annual Wellness (AWV)  10/22/2023   DTaP/Tdap/Td (3 - Tdap) 04/16/2025   Pneumonia Vaccine 52+ Years old  Completed   INFLUENZA VACCINE  Completed   DEXA SCAN  Completed   Hepatitis C Screening  Completed   Zoster Vaccines- Shingrix  Completed   HPV VACCINES  Aged Out   COLONOSCOPY (Pts 45-17yr Insurance coverage will need to be confirmed)  Discontinued    Health Maintenance  Health Maintenance Due  Topic Date Due   COVID-19 Vaccine (6 - 2023-24 season) 07/04/2022    Colorectal cancer screening: No longer required.   Mammogram status: Completed 07/11/2022. Repeat every year  Bone Density status: Completed 04/04/2020.   Lung Cancer Screening: (Low Dose CT Chest recommended if Age 819-80years, 30 pack-year currently smoking OR have quit w/in 15years.) does not qualify.   Lung Cancer Screening Referral: no  Additional Screening:  Hepatitis C Screening: does qualify; Completed 07/20/2015  Vision Screening: Recommended annual ophthalmology exams for early detection of glaucoma and other disorders of the eye. Is the patient up to date with their annual eye exam?  Yes  Who is the provider or what is the name of the office in which the patient attends annual eye exams? Dr. KGwyndolyn KaufmanIf pt is not established with a  provider, would they like to be referred to a provider to establish care? No .   Dental Screening: Recommended annual dental exams for proper oral hygiene  Community Resource Referral / Chronic Care Management: CRR required this visit?  No   CCM required this visit?  No      Plan:     I have personally reviewed and noted the following in the patient's chart:   Medical and social history Use of alcohol, tobacco or illicit drugs  Current medications and supplements including opioid prescriptions. Patient is not currently taking opioid prescriptions. Functional ability and status Nutritional status Physical activity Advanced directives List of other physicians Hospitalizations, surgeries, and ER visits in previous 12 months Vitals Screenings to include cognitive, depression, and falls Referrals and appointments  In addition, I have reviewed and discussed with patient certain preventive protocols, quality metrics, and  best practice recommendations. A written personalized care plan for preventive services as well as general preventive health recommendations were provided to patient.     Kellie Simmering, LPN   [id number]   Nurse Notes: none  Due to this being a virtual visit, the after visit summary with patients personalized plan was offered to patient via mail or my-chart. Patient would like to access on my-chart

## 2022-10-22 ENCOUNTER — Other Ambulatory Visit: Payer: Self-pay | Admitting: Nurse Practitioner

## 2022-10-22 DIAGNOSIS — F5105 Insomnia due to other mental disorder: Secondary | ICD-10-CM

## 2022-10-29 NOTE — Telephone Encounter (Signed)
Called pt to schedule appt with Baldo Ash to discuss medication options. She was advised to give [place] a call back to schedule an appt at her earliest convenience.

## 2022-10-30 ENCOUNTER — Ambulatory Visit (INDEPENDENT_AMBULATORY_CARE_PROVIDER_SITE_OTHER): Payer: Medicare PPO

## 2022-10-30 DIAGNOSIS — I10 Essential (primary) hypertension: Secondary | ICD-10-CM

## 2022-10-30 DIAGNOSIS — G4709 Other insomnia: Secondary | ICD-10-CM

## 2022-10-30 NOTE — Patient Instructions (Signed)
Visit Information It was great speaking with you today!  Please let me know if you have any questions about our visit.   Goals Addressed             This Visit's Progress    Track and Manage My Blood Pressure-Hypertension   On track    Timeframe:  Long-Range Goal Priority:  High Start Date: 10/17/2021                            Expected End Date:   10/18/2023                    Follow Up within 90 days   - check blood pressure weekly    Why is this important?   You won't feel high blood pressure, but it can still hurt your blood vessels.  High blood pressure can cause heart or kidney problems. It can also cause a stroke.  Making lifestyle changes like losing a little weight or eating less salt will help.  Checking your blood pressure at home and at different times of the day can help to control blood pressure.  If the doctor prescribes medicine remember to take it the way the doctor ordered.  Call the office if you cannot afford the medicine or if there are questions about it.     Notes:         Patient Care Plan: General Pharmacy (Adult)     Problem Identified: Hypertension, Hyperlipidemia, Atrial Fibrillation, GERD, Asthma, Hypothyroidism, Depression, Anxiety, Osteoporosis, and History of TIA, Insomnia   Priority: High     Long-Range Goal: Patient-Specific Goal   Start Date: 10/31/2021  Expected End Date: 10/31/2023  This Visit's Progress: On track  Recent Progress: On track  Priority: High  Note:   Current Barriers:  No barriers noted  Pharmacist Clinical Goal(s):  Patient will maintain control of blood pressure as evidenced by BP less than 140/90  through collaboration with PharmD and provider.   Interventions: 1:1 collaboration with Nche, Charlene Brooke, NP regarding development and update of comprehensive plan of care as evidenced by provider attestation and co-signature Inter-disciplinary care team collaboration (see longitudinal plan of  care) Comprehensive medication review performed; medication list updated in electronic medical record  Hypertension (BP goal <140/90) -Controlled -Current treatment: Amlodipine 10 mg daily  Bumetanide 1 mg twice daily  Metoprolol Xl 50 mg daily  -Medications previously tried: NA  -Current home readings: 939 systolic.  -Denies hypotensive/hypertensive symptoms -Recommended to continue current medication  Atrial Fibrillation (Goal: prevent stroke and major bleeding) -Controlled -CHADSVASC: 7 -Current treatment: Rate control: Metoprolol XL 50 mg twice daily Anticoagulation: Xarelto 15 mg nightly -Medications previously tried: NA -Recommended to continue current medication  Hyperlipidemia: (LDL goal < 70) -Controlled -History of TIA, History of CVA.  -Current treatment: Atorvastatin 80 mg daily  Ezetimibe 10 mg daily  -Medications previously tried: NA  -Recommended to continue current medication  [place] (Goal: control symptoms and prevent exacerbations) -Controlled -Current treatment  Advair Diskus 1 puff twice daily  Ventolin 1 puff every 6 hours as needed  Montelukast 10 mg nightly  -Medications previously tried: Flonase  -Exacerbations requiring treatment in last 6 months: None -Patient reports consistent use of maintenance inhaler -Frequency of rescue inhaler use: <1x monthly   Depression/Anxiety (Goal: Maintain symptom remission) -Controlled -Current treatment: None -Medications previously tried/failed: Alprazolam, Wellbutrin,  -PHQ9: 0 -GAD7: 0 -Recommended to continue current medication  Insomnia (  Goal: Improve sleep quality) -Controlled -Current treatment  Trazodone 50 mg nightly  -Medications previously tried: Suvorexant, zolpidem  -Previously had been on 150 mg nightly trazodone  -Currently 5-6 hours of sleep nightly. Feels well rested.  -Recommended to continue current medication  Osteoporosis (Goal Prevent fractures) -Controlled -Current treatment   Prolia 60 mg every 6 months (due 11/28/21)  Calcium carbonate +d3 500-400 mg twice daily  Vitamin D3 5000 units daily on Tues/Thurs/Sat -Medications previously tried: NA  -1 serving of dietary calcium  -Recommend 1200 mg of calcium daily from dietary and supplemental sources. -Switch from calcium carbonate to calcium citrate while taking Nexium.  -Recommended to continue current medication  Hypothyroidism (Goal: Maintain symptom remission) -Controlled -Current treatment  Levothyroxine 50 mcg daily  -Medications previously tried: NA  -Recommended to continue current medication  GERD (Goal: Minimize heartburn symptoms) -Controlled -Current treatment  Esomeprazole 40 mg daily  -Medications previously tried: NA -Continue current medications   Patient Goals/Self-Care Activities Patient will:  - check blood pressure weekly, document, and provide at future appointments  Follow Up Plan: Telephone follow up appointment with care management team member scheduled for:  05/21/2023 at 3:00 PM      Patient agreed to services and verbal consent obtained.   Patient verbalizes understanding of instructions and care plan provided today and agrees to view in [place]. Active MyChart status and patient understanding of how to access instructions and care plan via MyChart confirmed with patient.     Junius Argyle, PharmD, Para March, CPP Clinical Pharmacist Practitioner  [facility]  [phone]

## 2022-10-30 NOTE — Progress Notes (Signed)
Chronic Care Management Pharmacy Note  10/30/2022 Name:  Patricia Peck MRN:  [id number] DOB:  Dec 14, 1945  Summary: Patient presents for   Recommendations/Changes made from today's visit:   Plan: CPP follow-up 6 months  Subjective: Patricia Peck is an 76 y.o. year old female who is a primary patient of Nche, Charlene Brooke, NP.  The CCM team was consulted for assistance with disease management and care coordination needs.    Engaged with patient by telephone for follow up visit in response to provider referral for pharmacy case management and/or care coordination services.   Consent to Services:  The patient was given information about Chronic Care Management services, agreed to services, and gave verbal consent prior to initiation of services.  Please see initial visit note for detailed documentation.   Patient Care Team: Nche, Charlene Brooke, NP as PCP - General (Internal Medicine) Stanford Breed Denice Bors, MD as PCP - Cardiology (Cardiology) Lelon Perla, MD as Consulting Physician (Cardiology) Leta Baptist, MD as Consulting Physician (Otolaryngology) Mosetta Anis, MD as Referring Physician (Allergy) Germaine Pomfret, [facility] as Pharmacist (Pharmacist)  Recent office visits: 07/17/22: Patient presented to Dr. Grandville Silos for UTI.  05/29/2022 Dr. Grandville Silos MD (PCP Office) Start acetaminophen 650 mg PRN 02/27/2022  [name] NP (PCP Office) start Pyridium  100 mg PRN, start tizanidine 4 mg as needed  02/24/2022 Wilfred Lacy NP (PCP)Start Macrobid 100 MG capsule, Ambulatory referral to Allergy , Return in about 4 weeks  01/23/2022 [name] NP (PCP Office) Start montelukast (SINGULAIR) 10 MG tablet, Follow-up in 4 weeks  Recent consult visits: 07/24/2022 Dr. Bernardo Heater MD (urology) Start Lynnda Shields  07/11/2022 Dr. Stanford Breed MD (Cardiology) Start amlodipine 5 mg once daily, Restart ezetimibe 10 MG ONCE DAILY  07/03/2022 Dr. Julien Girt MD- Unable to see note 06/13/2022 Jory Sims NP (Cardiology) Start zolpidem [facility]) 5 MG tablet PRN 01/28/2022 Dr. Stanford Breed MD (Cardiology) Increase Amlodipine to 10 mg daily  Hospital visits: None in previous 6 months   Objective:  Lab Results  Component Value Date   CREATININE 0.94 09/12/2022   BUN 12 09/12/2022   GFR 69.64 07/19/2021   GFRNONAA >60 02/12/2021   GFRAA 59 (L) 03/26/2020   NA 143 09/12/2022   K 4.0 09/12/2022   CALCIUM 9.4 09/12/2022   CO2 26 09/12/2022   GLUCOSE 86 09/12/2022    Lab Results  Component Value Date/Time   HGBA1C 5.7 09/07/2017 04:10 PM   GFR 69.64 07/19/2021 11:08 AM   GFR 56.13 (L) 07/06/2019 09:54 AM    Last diabetic Eye exam: No results found for: "HMDIABEYEEXA"  Last diabetic Foot exam: No results found for: "HMDIABFOOTEX"   Lab Results  Component Value Date   CHOL 130 09/12/2022   HDL 40 09/12/2022   LDLCALC 63 09/12/2022   LDLDIRECT 129.2 05/28/2007   TRIG 157 (H) 09/12/2022   CHOLHDL 3.3 09/12/2022       Latest Ref Rng & Units 09/12/2022    9:32 AM 10/18/2021    9:52 AM 01/01/2021    9:18 AM  Hepatic Function  Total Protein 6.0 - 8.5 g/dL 6.8  6.7  6.4   Albumin 3.8 - 4.8 g/dL 4.5  4.8  4.2   AST 0 - 40 IU/L _0 ALT 0 - 32 IU/L _1 Alk Phosphatase 44 - 121 IU/L 76  80  69   Total Bilirubin 0.0 - 1.2 mg/dL 0.6  0.6  0.5  Bilirubin, Direct 0.00 - 0.40 mg/dL   0.15     Lab Results  Component Value Date/Time   TSH 0.99 07/19/2021 11:08 AM   TSH 2.44 07/20/2020 09:47 AM   FREET4 0.88 07/19/2021 11:08 AM   FREET4 0.88 07/20/2020 09:47 AM       Latest Ref Rng & Units 10/18/2021    9:52 AM 02/12/2021    8:48 AM 01/01/2021    9:18 AM  CBC  WBC 3.4 - 10.8 x10E3/uL 7.8  9.1  7.8   Hemoglobin 11.1 - 15.9 g/dL 13.2  13.3  11.8   Hematocrit 34.0 - 46.6 % 40.4  40.7  36.1   Platelets 150 - 450 x10E3/uL 328  350  372     Lab Results  Component Value Date/Time   VD25OH 69.63 07/06/2019 09:54 AM   VD25OH 64.58 04/17/2016 09:15 AM     Clinical ASCVD: No  The 10-year ASCVD risk score (Arnett DK, et al., 2019) is: 23.5%   Values used to calculate the score:     Age: 5 years     Sex: Female     Is Non-Hispanic African American: No     Diabetic: No     Tobacco smoker: No     Systolic Blood Pressure: 638 mmHg     Is BP treated: Yes     HDL Cholesterol: 40 mg/dL     Total Cholesterol: 130 mg/dL       10/21/2022    3:50 PM 06/24/2022   11:32 AM 10/09/2021   11:32 AM  Depression screen PHQ 2/9  Decreased Interest 0 0 0  Down, Depressed, Hopeless 0 1 0  PHQ - 2 Score 0 1 0    -Last DEXA Scan: 04/04/2020    T-Score femoral neck: -1.7  T-Score lumbar spine: -0.2  Social History   Tobacco Use  Smoking Status Never   Passive exposure: Never  Smokeless Tobacco Never   BP Readings from Last 3 Encounters:  10/17/22 126/84  09/05/22 (!) 193/92  07/24/22 (!) 191/103   Pulse Readings from Last 3 Encounters:  10/17/22 66  09/05/22 (!) 59  07/24/22 96   Wt Readings from Last 3 Encounters:  10/21/22 119 lb (54 kg)  10/17/22 119 lb (54 kg)  09/05/22 119 lb (54 kg)   BMI Readings from Last 3 Encounters:  10/21/22 21.08 kg/m  10/17/22 21.08 kg/m  09/05/22 21.08 kg/m    Assessment/Interventions: Review of patient past medical history, allergies, medications, health status, including review of consultants reports, laboratory and other test data, was performed as part of comprehensive evaluation and provision of chronic care management services.   SDOH:  (Social Determinants of Health) assessments and interventions performed: Yes SDOH Interventions    Flowsheet Row Clinical Support from 10/21/2022 in [place] Management from 10/17/2021 in [place] from 10/09/2021 in [place] from 06/21/2020 in [facility] Interventions  Intervention Not Indicated -- Intervention Not Indicated --  Housing Interventions -- -- Intervention Not Indicated --  Transportation Interventions Intervention Not Indicated -- Intervention Not Indicated --  Financial Strain Interventions Intervention Not Indicated Intervention Not Indicated Intervention Not Indicated --  Physical Activity Interventions Patient Refused, Other (Comments) -- Intervention Not Indicated Intervention Not Indicated  [Patient has set a goal to increase physical activity]  Stress Interventions Intervention Not Indicated -- Intervention Not Indicated --  Social Connections Interventions -- -- Intervention Not Indicated Intervention Not Indicated  [Patient says her & her husband stay at home due to husbands health conditions]      [place]: No Food Insecurity (10/21/2022)  Housing: Low Risk  (10/09/2021)  Transportation Needs: No Transportation Needs (10/21/2022)  Alcohol Screen: Low Risk  (10/09/2021)  Depression (PHQ2-9): Low Risk  (10/21/2022)  Financial Resource Strain: Low Risk  (10/21/2022)  Physical Activity: Inactive (10/21/2022)  Social Connections: Moderately Isolated (10/09/2021)  Stress: No Stress Concern Present (10/21/2022)  Tobacco Use: Low Risk  (10/21/2022)    [place]  No Known Allergies  Medications Reviewed Today     Reviewed by Germaine Pomfret, [facility] (Pharmacist) on 10/30/22 at 1425  Med List Status: <None>   Medication Order Taking? Sig Documenting Provider Last Dose Status Informant  Alpha-D-Galactosidase (BEANO PO) [id number]  Take 1 tablet by mouth 2 (two) times daily. [provider]  Active   amLODipine (NORVASC) 10 MG tablet [id number] Yes Take 1 tablet (10 mg total) by mouth daily. Lelon Perla, MD Taking Active   atorvastatin (LIPITOR) 80 MG tablet [id number] Yes Take 1 tablet (80 mg total) by mouth daily. Lelon Perla, MD Taking Active   Azelastine-Fluticasone 137-50 MCG/ACT Elizbeth Squires  [id number]  Place 1 spray into both nostrils daily. [provider]  Active   bumetanide (BUMEX) 1 MG tablet [id number] Yes TAKE 1 TABLET(1 MG) BY MOUTH TWICE DAILY Nche, Charlene Brooke, NP Taking Active   Calcium Carbonate-Vitamin D 600-400 MG-UNIT tablet [id number]  Take 1 tablet by mouth 2 (two) times daily. [provider]  Active Self  Cholecalciferol (VITAMIN D-3) 5000 UNITS TABS [id number]  Take 5,000 Units by mouth 3 (three) times a week. Tuesday Thursday Saturday [provider]  Active Self  Cranberry 450 MG CAPS [id number]  Take 2 capsules by mouth daily. [provider]  Active   denosumab (PROLIA) 60 MG/ML SOLN injection [id number]  Inject 60 mg into the skin once. Administer in upper arm, thigh, or abdomen  Patient taking differently: Inject 60 mg into the skin every 6 (six) months. Administer in upper arm, thigh, or abdomen   Lucille Passy, MD  Active   estradiol (ESTRACE) 0.1 MG/GM vaginal cream [id number] Yes Place 1 Applicatorful vaginally 3 (three) times a week. [provider] Taking Active Self  ezetimibe (ZETIA) 10 MG tablet [id number] Yes Take 1 tablet (10 mg total) by mouth daily. Lelon Perla, MD Taking Active    Patient not taking:   Discontinued 02/07/21 1305 (Reorder)   levothyroxine (SYNTHROID) 50 MCG tablet [id number] Yes TAKE 1 TABLET(50 MCG) BY MOUTH DAILY Nche, Charlene Brooke, NP Taking Active   Meth-Hyo-M Bl-Na Phos-Ph Sal (URO-MP) 118 MG CAPS [id number] Yes TAKE 2 CAPSULES DAILY BY MOUTH; IN THE MORNING AND AT BEDTIME  Patient taking differently: Take 1 capsule by mouth in the morning and at bedtime.   Stoioff, Ronda Fairly, MD Taking Active   metoprolol succinate (TOPROL-XL) 50 MG 24 hr tablet [id number] Yes Take 1 tablet (50 mg total) by mouth daily. Maintain upcoming appt for additional refill Lelon Perla, MD Taking Active   montelukast (SINGULAIR) 10 MG tablet [id number] Yes TAKE 1 TABLET(10 MG) BY MOUTH AT BEDTIME Nche,  Charlene Brooke, NP Taking Active   naphazoline-pheniramine (EYE ALLERGY RELIEF) 0.025-0.3 % ophthalmic solution [id number]  Place 1 drop into both eyes daily. [provider]  Active   NEXIUM 40 MG capsule [id number]  Yes Take 1 capsule (40 mg total) by mouth daily at 12 noon. Flossie Buffy, NP Taking Active   OVER THE COUNTER MEDICATION [id number]  Take 2 tablets by mouth daily. Bounty  Hair,Nail, and Skin daily gummies. [provider]  Active Self  potassium chloride (KLOR-CON) 10 MEQ tablet [id number] Yes TAKE 1 TABLET(10 MEQ) BY MOUTH TWICE DAILY Nche, Charlene Brooke, NP Taking Active   Rivaroxaban (XARELTO) 15 MG TABS tablet [id number] Yes Take 1 tablet (15 mg total) by mouth daily with supper. Lelon Perla, MD Taking Active   rOPINIRole (REQUIP) 0.25 MG tablet [id number] Yes TAKE 1 TABLET(0.25 MG) BY MOUTH AT BEDTIME Nche, Charlene Brooke, NP Taking Active   traZODone (DESYREL) 50 MG tablet [id number]  Take 1 tablet (50 mg total) by mouth at bedtime. Flossie Buffy, NP  Active   vitamin E 180 MG (400 UNITS) capsule [id number]  Take 400 Units by mouth 3 (three) times a week. 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/14/2019   History of TIA (transient ischemic attack) 08/01/2019   Anticoagulated 08/01/2019   Situational anxiety 05/23/2019   Eustachian tube disorder 04/12/2014   Chronic allergic conjunctivitis 08/24/2013   IBS (irritable bowel syndrome) 04/11/2013   PAF (paroxysmal atrial fibrillation) ([place]) 01/27/2013   Cerebrovascular disease 01/27/2013    Insomnia 11/02/2012   GERD 03/12/2010   Hypothyroidism 04/14/2007   Depression 04/14/2007   Essential hypertension 04/14/2007   Allergic rhinitis 04/14/2007   Osteoporosis 04/14/2007    Immunization History  Administered Date(s) Administered   Fluad Quad(high Dose 65+) 06/30/2019, 07/19/2022   Influenza Split 09/04/2011, 08/11/2012, 08/24/2013, 07/20/2015, 07/29/2016, 08/04/2017, 07/10/2018   Influenza Whole 08/07/2009   Influenza, High Dose Seasonal PF 09/26/2013, 08/28/2014, 11/27/2016, 12/10/2017, 11/18/2018, 06/30/2019, 11/15/2019, 11/13/2020, 11/14/2021   Influenza,inj,Quad PF,6+ Mos 08/24/2013, 07/20/2015, 07/29/2016, 08/04/2017   Influenza-Unspecified 07/10/2018, 07/14/2020   PFIZER(Purple Top)SARS-COV-2 Vaccination 11/23/2019, 01/04/2020, 02/02/2020, 04/05/2020, 08/30/2020   Pneumococcal Conjugate-13 04/12/2014   Pneumococcal Polysaccharide-23 02/05/2006, 02/05/2006, 04/08/2012, 04/08/2012, 04/22/2016, 11/27/2016, 12/10/2017, 11/18/2018, 11/15/2019, 04/05/2020, 11/13/2020, 11/14/2021   Td 01/29/2005, 04/17/2015   Zoster Recombinat (Shingrix) 02/09/2018, 05/11/2020   Zoster, Live 04/16/2007   Zoster, Unspecified 02/09/2018    Conditions to be addressed/monitored:  Hypertension, Hyperlipidemia, Atrial Fibrillation, GERD, Asthma, Hypothyroidism, Depression, Anxiety, Osteoporosis, and History of TIA, Insomnia  Care Plan : General Pharmacy (Adult)  Updates made by Germaine Pomfret, RPH since 10/30/2022 12:00 AM     Problem: Hypertension, Hyperlipidemia, Atrial Fibrillation, GERD, Asthma, Hypothyroidism, Depression, Anxiety, Osteoporosis, and History of TIA, Insomnia   Priority: High     Long-Range Goal: Patient-Specific Goal   Start Date: 10/31/2021  Expected End Date: 10/31/2023  This Visit's Progress: On track  Recent Progress: On track  Priority: High  Note:   Current Barriers:  No barriers noted  Pharmacist Clinical Goal(s):  Patient will maintain control of  blood pressure as evidenced by BP less than 140/90  through collaboration with PharmD and provider.   Interventions: 1:1 collaboration with Nche, Charlene Brooke, NP regarding development and update of comprehensive plan of  care as evidenced by provider attestation and co-signature Inter-disciplinary care team collaboration (see longitudinal plan of care) Comprehensive medication review performed; medication list updated in electronic medical record  Hypertension (BP goal <140/90) -Controlled -Current treatment: Amlodipine 10 mg daily  Bumetanide 1 mg twice daily  Metoprolol Xl 50 mg daily  -Medications previously tried: NA  -Current home readings: 329 systolic.  -Denies hypotensive/hypertensive symptoms -Recommended to continue current medication  Atrial Fibrillation (Goal: prevent stroke and major bleeding) -Controlled -CHADSVASC: 7 -Current treatment: Rate control: Metoprolol XL 50 mg twice daily Anticoagulation: Xarelto 15 mg nightly -Medications previously tried: NA -Recommended to continue current medication  Hyperlipidemia: (LDL goal < 70) -Controlled -History of TIA, History of CVA.  -Current treatment: Atorvastatin 80 mg daily  Ezetimibe 10 mg daily  -Medications previously tried: NA  -Recommended to continue current medication  [place] (Goal: control symptoms and prevent exacerbations) -Controlled -Current treatment  Advair Diskus 1 puff twice daily  Ventolin 1 puff every 6 hours as needed  Montelukast 10 mg nightly  -Medications previously tried: Flonase  -Exacerbations requiring treatment in last 6 months: None -Patient reports consistent use of maintenance inhaler -Frequency of rescue inhaler use: <1x monthly   Depression/Anxiety (Goal: Maintain symptom remission) -Controlled -Current treatment: None -Medications previously tried/failed: Alprazolam, Wellbutrin,  -PHQ9: 0 -GAD7: 0 -Recommended to continue current medication  Insomnia (Goal: Improve  sleep quality) -Controlled -Current treatment  Trazodone 50 mg nightly  -Medications previously tried: Suvorexant, zolpidem  -Previously had been on 150 mg nightly trazodone  -Currently 5-6 hours of sleep nightly. Feels well rested.  -Recommended to continue current medication  Osteoporosis (Goal Prevent fractures) -Controlled -Current treatment  Prolia 60 mg every 6 months (due 11/28/21)  Calcium carbonate +d3 500-400 mg twice daily  Vitamin D3 5000 units daily on Tues/Thurs/Sat -Medications previously tried: NA  -1 serving of dietary calcium  -Recommend 1200 mg of calcium daily from dietary and supplemental sources. -Switch from calcium carbonate to calcium citrate while taking Nexium.  -Recommended to continue current medication  Hypothyroidism (Goal: Maintain symptom remission) -Controlled -Current treatment  Levothyroxine 50 mcg daily  -Medications previously tried: NA  -Recommended to continue current medication  GERD (Goal: Minimize heartburn symptoms) -Controlled -Current treatment  Esomeprazole 40 mg daily  -Medications previously tried: NA -Continue current medications   Patient Goals/Self-Care Activities Patient will:  - check blood pressure weekly, document, and provide at future appointments  Follow Up Plan: Telephone follow up appointment with care management team member scheduled for:  05/21/2023 at 3:00 PM       Medication Assistance: None required.  Patient affirms current coverage meets needs.  Compliance/Adherence/Medication fill history: Care Gaps: Influenza Vaccine  Star-Rating Drugs: Atorvastatin 80 mg last filled 01/03/2021 90 day supply at [facility].  Patient's preferred pharmacy is:  [id number] [address], [address] AT [facility] OF [address] [place] [address] Phone: [phone] Fax: [phone]  Uses pill box? Yes Pt endorses 100% compliance  We discussed: Current  pharmacy is preferred with insurance plan and patient is satisfied with pharmacy services Patient decided to: Continue current medication management strategy  Care Plan and Follow Up Patient Decision:  Patient agrees to Care Plan and Follow-up.  Plan: Telephone follow up appointment with care management team member scheduled for:  05/21/2023 at 3:00 PM  Junius Argyle, PharmD, [place], CPP Clinical Pharmacist Practitioner  [facility]  [phone]

## 2022-11-02 DIAGNOSIS — I10 Essential (primary) hypertension: Secondary | ICD-10-CM

## 2022-11-04 NOTE — Telephone Encounter (Signed)
Trazodone use is contraindicated while taking Uro-MP due to a high risk of serotonin syndrome that can occur due to the methylene blue component of Uro-MP. From UpToDate:   Do not initiate mirtazapine or trazodone in patients treated with methylene blue or who have received methylene blue within the past 14 days. Prescribing information for mirtazapine and trazodone list this combination as contraindicated. If a patient already receiving mirtazapine or trazodone therapy requires urgent treatment with methylene blue, and no acceptable alternatives are available, and the potential benefits of methylene blue outweigh the risks of serotonin syndrome/serotonin toxicity (SS/ST) in a particular patient, mirtazapine or trazodone should be stopped promptly upon initiation of methylene blue. Monitor the patient for symptoms of SS/ST.

## 2022-11-05 NOTE — Telephone Encounter (Signed)
Left VM, adv pt to call back

## 2022-11-06 ENCOUNTER — Ambulatory Visit: Payer: Medicare PPO | Admitting: Physician Assistant

## 2022-11-06 NOTE — Telephone Encounter (Signed)
Pt advised via Detailed VM

## 2022-11-06 NOTE — Progress Notes (Signed)
HPI: FU atrial fibrillation and TIA. Brain MRI in January 2014 showed no acute infarct, mild small vessel disease. TEE 2/14 showed normal LV function, mild mitral valve prolapse with mild mitral regurgitation, trace aortic insufficiency. There was no PFO. Monitor in March of 2014 showed paroxysmal atrial fibrillation/flutter. She was therefore started on anticoagulation. Carotid Dopplers 10/20 showed 1-39% bilateral stenosis; left subclavian stenosis noted.  Cardiac CTA October 2020 showed calcium score 7 but no obstructive coronary disease. Monitor June 2021 showed sinus rhythm with PACs and PAF.  Echocardiogram February 2022 showed normal LV function, mild aortic insufficiency. Since I last saw her she denies dyspnea, chest pain, palpitations, syncope or bleeding.  Current Outpatient Medications  Medication Sig Dispense Refill   Alpha-D-Galactosidase (BEANO PO) Take 1 tablet by mouth 2 (two) times daily.     amLODipine (NORVASC) 10 MG tablet Take 1 tablet (10 mg total) by mouth daily. 90 tablet 3   atorvastatin (LIPITOR) 80 MG tablet Take 1 tablet (80 mg total) by mouth daily. 90 tablet 3   Azelastine-Fluticasone 137-50 MCG/ACT SUSP Place 1 spray into both nostrils daily.     bumetanide (BUMEX) 1 MG tablet TAKE 1 TABLET(1 MG) BY MOUTH TWICE DAILY 180 tablet 1   Calcium Carbonate-Vitamin D 600-400 MG-UNIT tablet Take 1 tablet by mouth 2 (two) times daily.     Cholecalciferol (VITAMIN D-3) 5000 UNITS TABS Take 5,000 Units by mouth 3 (three) times a week. Tuesday Thursday Saturday     Cranberry 450 MG CAPS Take 2 capsules by mouth daily.     denosumab (PROLIA) 60 MG/ML SOLN injection Inject 60 mg into the skin once. Administer in upper arm, thigh, or abdomen (Patient taking differently: Inject 60 mg into the skin every 6 (six) months. Administer in upper arm, thigh, or abdomen) 180 mL 0   estradiol (ESTRACE) 0.1 MG/GM vaginal cream Place 1 Applicatorful vaginally 3 (three) times a week.      ezetimibe (ZETIA) 10 MG tablet Take 1 tablet (10 mg total) by mouth daily. 90 tablet 3   levothyroxine (SYNTHROID) 50 MCG tablet TAKE 1 TABLET(50 MCG) BY MOUTH DAILY 90 tablet 3   metoprolol succinate (TOPROL-XL) 50 MG 24 hr tablet Take 1 tablet (50 mg total) by mouth daily. Maintain upcoming appt for additional refill 90 tablet 3   montelukast (SINGULAIR) 10 MG tablet TAKE 1 TABLET(10 MG) BY MOUTH AT BEDTIME 90 tablet 1   naphazoline-pheniramine (EYE ALLERGY RELIEF) 0.025-0.3 % ophthalmic solution Place 1 drop into both eyes daily.     NEXIUM 40 MG capsule Take 1 capsule (40 mg total) by mouth daily at 12 noon. 90 capsule 1   OVER THE COUNTER MEDICATION Take 2 tablets by mouth daily. Bounty  Hair,Nail, and Skin daily gummies.     potassium chloride (KLOR-CON) 10 MEQ tablet TAKE 1 TABLET(10 MEQ) BY MOUTH TWICE DAILY 180 tablet 1   Rivaroxaban (XARELTO) 15 MG TABS tablet Take 1 tablet (15 mg total) by mouth daily with supper. 90 tablet 1   rOPINIRole (REQUIP) 0.25 MG tablet TAKE 1 TABLET(0.25 MG) BY MOUTH AT BEDTIME 90 tablet 3   temazepam (RESTORIL) 7.5 MG capsule Take 1 capsule (7.5 mg total) by mouth at bedtime as needed for sleep. Maintain upcoming appt to get additional refills 30 capsule 0   vitamin E 180 MG (400 UNITS) capsule Take 400 Units by mouth 3 (three) times a week. Tuesdays, Thursdays, and Saturdays     Meth-Hyo-M Bl-Na Phos-Ph Sal (URO-MP)  118 MG CAPS TAKE 2 CAPSULES DAILY BY MOUTH; IN THE MORNING AND AT BEDTIME (Patient not taking: Reported on 11/20/2022) 60 capsule 1   No current facility-administered medications for this visit.     Past Medical History:  Diagnosis Date   Allergy    Arthritis    hands   Asthma    uses an inhaler   Atrial fibrillation (HCC)    Atrial flutter (HCC)    Depression    Dysrhythmia    a-fib, on Xarelto   GERD (gastroesophageal reflux disease)    Hyperlipidemia    Hypertension    Hypothyroidism    Osteoporosis    Stroke ([place]) 2017   TIA    TIA (transient ischemic attack)     Past Surgical History:  Procedure Laterality Date   ABDOMINAL HYSTERECTOMY     COLONOSCOPY     MYRINGOTOMY WITH TUBE PLACEMENT Right 01/14/2016   Procedure: RIGHT MYRINGOTOMY WITH T-TUBE PLACEMENT;  Surgeon: Leta Baptist, MD;  Location: [place];  Service: ENT;  Laterality: Right;   [place] OOPHERECTOMY Bilateral 02/14/2021   Procedure: XI ROBOTIC ASSISTED BILATERAL SALPINGO OOPHORECTOMY;  Surgeon: Everitt Amber, MD;  Location: [facility];  Service: Gynecology;  Laterality: Bilateral;   TEE WITHOUT CARDIOVERSION N/A 12/22/2012   Procedure: TRANSESOPHAGEAL ECHOCARDIOGRAM (TEE);  Surgeon: Lelon Perla, MD;  Location: [facility];  Service: Cardiovascular;  Laterality: N/A;    Social History   Socioeconomic History   Marital status: Widowed    Spouse name: Not on file   Number of children: 2   Years of education: Not on file   Highest education level: Not on file  Occupational History    Employer: RETIRED  Tobacco Use   Smoking status: Never    Passive exposure: Never   Smokeless tobacco: Never  Vaping Use   Vaping Use: Never used  Substance and Sexual Activity   Alcohol use: Yes    Comment: Rare   Drug use: No   Sexual activity: Not Currently  Other Topics Concern   Not on file  Social History Narrative   Does have HPOA- sons Eddie Dibbles and Wille Glaser.   Does desire life support if not futile.   Not sure about feeding tubes.   Social Determinants of Health   Financial Resource Strain: Low Risk  (10/21/2022)   Overall Financial Resource Strain (CARDIA)    Difficulty of Paying Living Expenses: Not hard at all  Food Insecurity: No Food Insecurity (10/21/2022)   Hunger Vital Sign    Worried About Running Out of Food in the Last Year: Never true    Ran Out of Food in the Last Year: Never true  Transportation Needs: No Transportation Needs (10/21/2022)   PRAPARE - Armed forces logistics/support/administrative officer (Medical): No    Lack of Transportation (Non-Medical): No  Physical Activity: Inactive (10/21/2022)   Exercise Vital Sign    Days of Exercise per Week: 0 days    Minutes of Exercise per Session: 0 min  Stress: No Stress Concern Present (10/21/2022)   [place]    Feeling of Stress : Not at all  Social Connections: Moderately Isolated (10/09/2021)   Social Connection and Isolation Panel [NHANES]    Frequency of Communication with Friends and Family: Twice a week    Frequency of Social Gatherings with Friends and Family: Twice a week    Attends Religious  Services: Never    Active Member of Clubs or Organizations: No    Attends Archivist Meetings: Never    Marital Status: Married  Human resources officer Violence: Not At Risk (10/09/2021)   Humiliation, Afraid, Rape, and Kick questionnaire    Fear of Current or Ex-Partner: No    Emotionally Abused: No    Physically Abused: No    Sexually Abused: No    Family History  Problem Relation Age of Onset   Breast cancer Mother    Drug abuse Son    Depression Son    Stroke Paternal Aunt    Heart disease Paternal Grandfather    Diabetes Maternal Grandfather    Colon cancer Neg Hx    Ovarian cancer Neg Hx    Endometrial cancer Neg Hx    Pancreatic cancer Neg Hx    Prostate cancer Neg Hx     ROS: no fevers or chills, productive cough, hemoptysis, dysphasia, odynophagia, melena, hematochezia, dysuria, hematuria, rash, seizure activity, orthopnea, PND, pedal edema, claudication. Remaining systems are negative.  Physical Exam: Well-developed well-nourished in no acute distress.  Skin is warm and dry.  HEENT is normal.  Neck is supple.  Chest is clear to auscultation with normal expansion.  Cardiovascular exam is regular rate and rhythm.  Abdominal exam nontender or distended. No masses palpated. Extremities show no edema. neuro grossly  intact   A/P  1 paroxysmal atrial fibrillation-we will continue Toprol and Xarelto at present dose.  2 hypertension-blood pressure controlled.  Continue present medical regimen.  3 hyperlipidemia-continue present medical regimen.  4 aortic insufficiency-mild on previous echocardiogram.    5 carotid artery disease-mild on most recent Dopplers.  6 subclavian stenosis-continue statin.  Patient has no symptoms.  7 history of mitral valve prolapse-this was not present on most recent echocardiogram.  Kirk Ruths, MD

## 2022-11-07 ENCOUNTER — Ambulatory Visit (INDEPENDENT_AMBULATORY_CARE_PROVIDER_SITE_OTHER): Payer: Medicare PPO | Admitting: Physician Assistant

## 2022-11-07 VITALS — BP 152/88 | HR 64 | Ht 63.0 in

## 2022-11-07 DIAGNOSIS — R339 Retention of urine, unspecified: Secondary | ICD-10-CM | POA: Diagnosis not present

## 2022-11-07 LAB — BLADDER SCAN AMB NON-IMAGING: Scan Result: 285

## 2022-11-07 LAB — MICROSCOPIC EXAMINATION

## 2022-11-07 LAB — URINALYSIS, COMPLETE
Bilirubin, UA: NEGATIVE
Glucose, UA: NEGATIVE
Ketones, UA: NEGATIVE
Leukocytes,UA: NEGATIVE
Nitrite, UA: NEGATIVE
Protein,UA: NEGATIVE
RBC, UA: NEGATIVE
Specific Gravity, UA: 1.015 (ref 1.005–1.030)
Urobilinogen, Ur: 0.2 mg/dL (ref 0.2–1.0)
pH, UA: 7 (ref 5.0–7.5)

## 2022-11-07 NOTE — Progress Notes (Signed)
11/07/2022 10:09 AM   Patricia Peck January 20, 1946 [id number]  CC: Chief Complaint  Patient presents with   incomplete bladder emptying   HPI: Patricia Peck is a 77 y.o. female with PMH chronic dysuria, frequency, urgency, vaginal burning, occasional UTI, and incomplete bladder emptying previously on Gemtesa who presents today for symptom recheck after being treated for UTI and stopping Gemtesa in the setting of incomplete bladder emptying last month.   Today she reports her urinary symptoms resolved on culture appropriate Bactrim.  Urine culture grew MDR E. coli.  She denies any urinary concerns today.  She has been taking Uribel and wonders if she should continue this.  She specifically denies dysuria.  In-office UA today pan negative; urine microscopy with moderate bacteria. PVR 254m.  Most recent creatinine on 09/12/2022 was stable at 0.94.  PMH: Past Medical History:  Diagnosis Date   Allergy    Arthritis    hands   Asthma    uses an inhaler   Atrial fibrillation (HCC)    Atrial flutter (HCC)    Depression    Dysrhythmia    a-fib, on Xarelto   GERD (gastroesophageal reflux disease)    Hyperlipidemia    Hypertension    Hypothyroidism    Osteoporosis    [place]   TIA   TIA (transient ischemic attack)     Surgical History: Past Surgical History:  Procedure Laterality Date   ABDOMINAL HYSTERECTOMY     COLONOSCOPY     MYRINGOTOMY WITH TUBE PLACEMENT Right 01/14/2016   Procedure: RIGHT MYRINGOTOMY WITH T-TUBE PLACEMENT;  Surgeon: SLeta Baptist MD;  [place]  Service: E[place];  Laterality: Right;   [place]   Procedure: XI ROBOTIC ASSISTED BILATERAL SALPINGO OOPHORECTOMY;  Surgeon: REveritt Amber MD;  [facility];  Service: Gynecology;  Laterality: Bilateral;   TEE WITHOUT CARDIOVERSION N/A 12/22/2012   Procedure: TRANSESOPHAGEAL ECHOCARDIOGRAM  (TEE);  Surgeon: BLelon Perla MD;  [facility];  Service: Cardiovascular;  Laterality: N/A;    Home Medications:  Allergies as of 11/07/2022   No Known Allergies      Medication List        Accurate as of November 07, 2022 10:09 AM. If you have any questions, ask your nurse or doctor.          amLODipine 10 MG tablet Commonly known as: NORVASC Take 1 tablet (10 mg total) by mouth daily.   atorvastatin 80 MG tablet Commonly known as: LIPITOR Take 1 tablet (80 mg total) by mouth daily.   Azelastine-Fluticasone 137-50 MCG/ACT Susp Place 1 spray into both nostrils daily.   BEANO PO Take 1 tablet by mouth 2 (two) times daily.   bumetanide 1 MG tablet Commonly known as: BUMEX TAKE 1 TABLET(1 MG) BY MOUTH TWICE DAILY   Calcium Carbonate-Vitamin D 600-400 MG-UNIT tablet Take 1 tablet by mouth 2 (two) times daily.   Cranberry 450 MG Caps Take 2 capsules by mouth daily.   denosumab 60 MG/ML Soln injection Commonly known as: PROLIA Inject 60 mg into the skin once. Administer in upper arm, thigh, or abdomen What changed: when to take this   estradiol 0.1 MG/GM vaginal cream Commonly known as: ESTRACE Place 1 Applicatorful vaginally 3 (three) times a week.   Eye Allergy Relief 0.025-0.3 % ophthalmic solution Generic drug: naphazoline-pheniramine Place 1 drop into both eyes daily.   ezetimibe 10 MG tablet  Commonly known as: ZETIA Take 1 tablet (10 mg total) by mouth daily.   levothyroxine 50 MCG tablet Commonly known as: SYNTHROID TAKE 1 TABLET(50 MCG) BY MOUTH DAILY   metoprolol succinate 50 MG 24 hr tablet Commonly known as: TOPROL-XL Take 1 tablet (50 mg total) by mouth daily. Maintain upcoming appt for additional refill   montelukast 10 MG tablet Commonly known as: SINGULAIR TAKE 1 TABLET(10 MG) BY MOUTH AT BEDTIME   NexIUM 40 MG capsule Generic drug: esomeprazole Take 1 capsule (40 mg total) by mouth daily at 12 noon.   OVER THE COUNTER  MEDICATION Take 2 tablets by mouth daily. Bounty  Hair,Nail, and Skin daily gummies.   potassium chloride 10 MEQ tablet Commonly known as: KLOR-CON TAKE 1 TABLET(10 MEQ) BY MOUTH TWICE DAILY   Rivaroxaban 15 MG Tabs tablet Commonly known as: XARELTO Take 1 tablet (15 mg total) by mouth daily with supper.   rOPINIRole 0.25 MG tablet Commonly known as: REQUIP TAKE 1 TABLET(0.25 MG) BY MOUTH AT BEDTIME   temazepam 7.5 MG capsule Commonly known as: RESTORIL Take 1 capsule (7.5 mg total) by mouth at bedtime as needed for sleep. Maintain upcoming appt to get additional refills   Uro-MP 118 MG Caps TAKE 2 CAPSULES DAILY BY MOUTH; IN THE MORNING AND AT BEDTIME What changed: See the new instructions.   Vitamin D-3 125 MCG (5000 UT) Tabs Take 5,000 Units by mouth 3 (three) times a week. Tuesday Thursday Saturday   vitamin E 180 MG (400 UNITS) capsule Take 400 Units by mouth 3 (three) times a week. Tuesdays, Thursdays, and Saturdays        Allergies:  No Known Allergies  Family History: Family History  Problem Relation Age of Onset   Breast cancer Mother    Drug abuse Son    Depression Son    Stroke Paternal Aunt    Heart disease Paternal Grandfather    Diabetes Maternal Grandfather    Colon cancer Neg Hx    Ovarian cancer Neg Hx    Endometrial cancer Neg Hx    Pancreatic cancer Neg Hx    Prostate cancer Neg Hx     Social History:   reports that she has never smoked. She has never been exposed to tobacco smoke. She has never used smokeless tobacco. She reports current alcohol use. She reports that she does not use drugs.  Physical Exam: BP (!) 152/88   Pulse 64   Ht '5\' 3"'$  (1.6 m)   BMI 21.08 kg/m   Constitutional:  Alert and oriented, no acute distress, nontoxic appearing HEENT: [place], AT Cardiovascular: No clubbing, cyanosis, or edema Respiratory: Normal respiratory effort, no increased work of breathing Skin: No rashes, bruises or suspicious lesions Neurologic:  Grossly intact, no focal deficits, moving all 4 extremities Psychiatric: Normal mood and affect  Laboratory Data: Results for orders placed or performed in visit on 11/07/22  Microscopic Examination   Urine  Result Value Ref Range   WBC, UA 0-5 0 - 5 /hpf   RBC, Urine 0-2 0 - 2 /hpf   Epithelial Cells (non renal) 0-10 0 - 10 /hpf   Bacteria, UA Moderate (A) None seen/Few  Urinalysis, Complete  Result Value Ref Range   Specific Gravity, UA 1.015 1.005 - 1.030   pH, UA 7.0 5.0 - 7.5   Color, UA Green (A) Yellow   Appearance Ur Clear Clear   Leukocytes,UA Negative Negative   Protein,UA Negative Negative/Trace   Glucose, UA Negative Negative  Ketones, UA Negative Negative   RBC, UA Negative Negative   Bilirubin, UA Negative Negative   Urobilinogen, Ur 0.2 0.2 - 1.0 mg/dL   Nitrite, UA Negative Negative   Microscopic Examination See below:   Bladder Scan (Post Void Residual) in office  Result Value Ref Range   Scan Result 285 ml    Assessment & Plan:   1. Incomplete bladder emptying Stable incomplete bladder emptying per bladder scan today.  She is asymptomatic.  Her UTI symptoms have resolved and her UA is bland today.  Will have her continue with plans for follow-up with Dr. Matilde Sprang later this month for consideration of possible urodynamics.  We discussed that indications for more aggressive management of her incomplete bladder emptying include recurrent UTIs, worsening renal function, or hydronephrosis.  In the absence of these, conservative management would be appropriate.  She expressed understanding. - Urinalysis, Complete - Bladder Scan (Post Void Residual) in office  Return if symptoms worsen or fail to improve.  Debroah Loop, PA-C  [facility] [address], [place], [place] [address] [phone]

## 2022-11-20 ENCOUNTER — Ambulatory Visit: Payer: Medicare PPO | Attending: Cardiology | Admitting: Cardiology

## 2022-11-20 ENCOUNTER — Encounter: Payer: Self-pay | Admitting: Cardiology

## 2022-11-20 VITALS — BP 140/78 | HR 61 | Ht 63.0 in | Wt 120.2 lb

## 2022-11-20 DIAGNOSIS — I10 Essential (primary) hypertension: Secondary | ICD-10-CM

## 2022-11-20 DIAGNOSIS — E7800 Pure hypercholesterolemia, unspecified: Secondary | ICD-10-CM

## 2022-11-20 DIAGNOSIS — I480 Paroxysmal atrial fibrillation: Secondary | ICD-10-CM

## 2022-11-20 NOTE — Patient Instructions (Signed)
    Follow-Up: At [facility], you and your health needs are our priority.  As part of our continuing mission to provide you with exceptional heart care, we have created designated Provider Care Teams.  These Care Teams include your primary Cardiologist (physician) and Advanced Practice Providers (APPs -  Physician Assistants and Nurse Practitioners) who all work together to provide you with the care you need, when you need it.  We recommend signing up for the patient portal called "MyChart".  Sign up information is provided on this After Visit Summary.  MyChart is used to connect with patients for Virtual Visits (Telemedicine).  Patients are able to view lab/test results, encounter notes, upcoming appointments, etc.  Non-urgent messages can be sent to your provider as well.   To learn more about what you can do with MyChart, go to [email or web address].    Your next appointment:   6 month(s)  Provider:   Kirk Ruths, MD

## 2022-11-28 ENCOUNTER — Ambulatory Visit (INDEPENDENT_AMBULATORY_CARE_PROVIDER_SITE_OTHER): Payer: Medicare PPO | Admitting: Nurse Practitioner

## 2022-11-28 ENCOUNTER — Encounter: Payer: Self-pay | Admitting: Nurse Practitioner

## 2022-11-28 VITALS — BP 102/60 | HR 60 | Temp 97.0°F | Ht 63.0 in | Wt 123.4 lb

## 2022-11-28 DIAGNOSIS — N3090 Cystitis, unspecified without hematuria: Secondary | ICD-10-CM

## 2022-11-28 DIAGNOSIS — E038 Other specified hypothyroidism: Secondary | ICD-10-CM

## 2022-11-28 DIAGNOSIS — B370 Candidal stomatitis: Secondary | ICD-10-CM

## 2022-11-28 DIAGNOSIS — I771 Stricture of artery: Secondary | ICD-10-CM

## 2022-11-28 DIAGNOSIS — F5105 Insomnia due to other mental disorder: Secondary | ICD-10-CM

## 2022-11-28 DIAGNOSIS — F99 Mental disorder, not otherwise specified: Secondary | ICD-10-CM

## 2022-11-28 LAB — TSH: TSH: 2.91 u[IU]/mL (ref 0.35–5.50)

## 2022-11-28 LAB — T4, FREE: Free T4: 2.07 ng/dL — ABNORMAL HIGH (ref 0.60–1.60)

## 2022-11-28 MED ORDER — TEMAZEPAM 7.5 MG PO CAPS: 7.5000 mg | ORAL_CAPSULE | Freq: Every evening | ORAL | 5 refills | Status: DC | PRN

## 2022-11-28 MED ORDER — NYSTATIN 100000 UNIT/ML MT SUSP: 5.0000 mL | Freq: Four times a day (QID) | OROMUCOSAL | 1 refills | Status: DC

## 2022-11-28 NOTE — Progress Notes (Signed)
Established Patient Visit  Patient: Patricia Peck   DOB: 1946/03/08   77 y.o. Female  MRN: [id number] Visit Date: 11/28/2022  Subjective:    Chief Complaint  Patient presents with   Office Visit    Med refills F/u appt  No concerns    HPI Cystitis Followed by [place] urology secondary to incomplete bladder emptying Gemtesa and uro-MP discontinued. Current use of estradial cream 3x/week per urology Plans to get urodynamic procedure by Dr. Octavio Manns  Hypothyroidism Repeat TSH and t4  Insomnia Chronic, onset several years ago, Able to fall asleep, bedtime at 10pm, Wakes up at 2am-3am and unable to return to sleep. no improvement with trazodone '150mg'$  or lmelatonin Some improvement with restoril 7.'5mg'$ , ongoing for several years Worse with death of husband 39month ago Admits to grief Coffee: no, Soda: 16oz, Unsweetened tea: yes, several cups, Etoh: once a week. 176med drink.  Maintain restoril dose Declined referral to grief counselor, she plans to use grief coaching video at this time   Reviewed medical, surgical, and social history today  Medications: Outpatient Medications Prior to Visit  Medication Sig   Alpha-D-Galactosidase (BEANO PO) Take 1 tablet by mouth 2 (two) times daily.   amLODipine (NORVASC) 10 MG tablet Take 1 tablet (10 mg total) by mouth daily.   atorvastatin (LIPITOR) 80 MG tablet Take 1 tablet (80 mg total) by mouth daily.   Azelastine-Fluticasone 137-50 MCG/ACT SUSP Place 1 spray into both nostrils daily.   bumetanide (BUMEX) 1 MG tablet TAKE 1 TABLET(1 MG) BY MOUTH TWICE DAILY   Calcium Carbonate-Vitamin D 600-400 MG-UNIT tablet Take 1 tablet by mouth 2 (two) times daily.   Cholecalciferol (VITAMIN D-3) 5000 UNITS TABS Take 5,000 Units by mouth 3 (three) times a week. Tuesday Thursday Saturday   Cranberry 450 MG CAPS Take 2 capsules by mouth daily.   denosumab (PROLIA) 60 MG/ML SOLN injection Inject 60 mg into the skin once. Administer  in upper arm, thigh, or abdomen (Patient taking differently: Inject 60 mg into the skin every 6 (six) months. Administer in upper arm, thigh, or abdomen)   estradiol (ESTRACE) 0.1 MG/GM vaginal cream Place 1 Applicatorful vaginally 3 (three) times a week.   ezetimibe (ZETIA) 10 MG tablet Take 1 tablet (10 mg total) by mouth daily.   levothyroxine (SYNTHROID) 50 MCG tablet TAKE 1 TABLET(50 MCG) BY MOUTH DAILY   metoprolol succinate (TOPROL-XL) 50 MG 24 hr tablet Take 1 tablet (50 mg total) by mouth daily. Maintain upcoming appt for additional refill   montelukast (SINGULAIR) 10 MG tablet TAKE 1 TABLET(10 MG) BY MOUTH AT BEDTIME   NEXIUM 40 MG capsule Take 1 capsule (40 mg total) by mouth daily at 12 noon.   OVER THE COUNTER MEDICATION Take 2 tablets by mouth daily. Bounty  Hair,Nail, and Skin daily gummies.   potassium chloride (KLOR-CON) 10 MEQ tablet TAKE 1 TABLET(10 MEQ) BY MOUTH TWICE DAILY   Rivaroxaban (XARELTO) 15 MG TABS tablet Take 1 tablet (15 mg total) by mouth daily with supper.   rOPINIRole (REQUIP) 0.25 MG tablet TAKE 1 TABLET(0.25 MG) BY MOUTH AT BEDTIME   vitamin E 180 MG (400 UNITS) capsule Take 400 Units by mouth 3 (three) times a week. Tuesdays, Thursdays, and Saturdays   [DISCONTINUED] temazepam (RESTORIL) 7.5 MG capsule Take 1 capsule (7.5 mg total) by mouth at bedtime as needed for sleep. Maintain upcoming appt to get additional refills   [  DISCONTINUED] HYDROcodone-acetaminophen (NORCO/VICODIN) 5-325 MG tablet Take 1 tablet by mouth every 6 (six) hours as needed for moderate pain. (Patient not taking: No sig reported)   [DISCONTINUED] Meth-Hyo-M Bl-Na Phos-Ph Sal (URO-MP) 118 MG CAPS TAKE 2 CAPSULES DAILY BY MOUTH; IN THE MORNING AND AT BEDTIME (Patient not taking: Reported on 11/20/2022)   [DISCONTINUED] naphazoline-pheniramine (EYE ALLERGY RELIEF) 0.025-0.3 % ophthalmic solution Place 1 drop into both eyes daily. (Patient not taking: Reported on 11/28/2022)   No  facility-administered medications prior to visit.   Reviewed past medical and social history.   ROS per HPI above  Last metabolic panel Lab Results  Component Value Date   GLUCOSE 86 09/12/2022   NA 143 09/12/2022   K 4.0 09/12/2022   CL 104 09/12/2022   CO2 26 09/12/2022   BUN 12 09/12/2022   CREATININE 0.94 09/12/2022   EGFR 62 09/12/2022   CALCIUM 9.4 09/12/2022   PROT 6.8 09/12/2022   ALBUMIN 4.5 09/12/2022   LABGLOB 2.3 09/12/2022   AGRATIO 2.0 09/12/2022   BILITOT 0.6 09/12/2022   ALKPHOS 76 09/12/2022   AST 15 09/12/2022   ALT 15 09/12/2022   ANIONGAP 10 02/12/2021   Last lipids Lab Results  Component Value Date   CHOL 130 09/12/2022   HDL 40 09/12/2022   LDLCALC 63 09/12/2022   LDLDIRECT 129.2 05/28/2007   TRIG 157 (H) 09/12/2022   CHOLHDL 3.3 09/12/2022        Objective:  BP 102/60 (BP Location: Right Arm, Patient Position: Sitting, Cuff Size: Small)   Pulse 60   Temp (!) 97 F (36.1 C) (Temporal)   Ht '5\' 3"'$  (1.6 m)   Wt 123 lb 6.4 oz (56 kg)   SpO2 91%   BMI 21.86 kg/m      Physical Exam Cardiovascular:     Rate and Rhythm: Normal rate and regular rhythm.     Pulses: Normal pulses.     Heart sounds: Normal heart sounds.  Pulmonary:     Effort: Pulmonary effort is normal.     Breath sounds: Normal breath sounds.  Musculoskeletal:     Right lower leg: No edema.     Left lower leg: No edema.  Neurological:     Mental Status: She is alert and oriented to person, place, and time.  Psychiatric:        Attention and Perception: Attention normal.        Mood and Affect: Mood is anxious. Affect is tearful.        Speech: Speech normal.        Behavior: Behavior is cooperative.        Thought Content: Thought content normal.        Cognition and Memory: Cognition and memory normal.        Judgment: Judgment normal.     No results found for any visits on 11/28/22.    Assessment & Plan:    Problem List Items Addressed This Visit        Cardiovascular and Mediastinum   Stenosis of left subclavian artery (HCC)     Endocrine   Hypothyroidism    Repeat TSH and t4      Relevant Orders   TSH   T4, free     Genitourinary   Cystitis    Followed by [place] urology secondary to incomplete bladder emptying Gemtesa and uro-MP discontinued. Current use of estradial cream 3x/week per urology Plans to get urodynamic procedure by Dr. Octavio Manns  Other   Insomnia    Chronic, onset several years ago, Able to fall asleep, bedtime at 10pm, Wakes up at 2am-3am and unable to return to sleep. no improvement with trazodone '150mg'$  or lmelatonin Some improvement with restoril 7.'5mg'$ , ongoing for several years Worse with death of husband 19month ago Admits to grief Coffee: no, Soda: 16oz, Unsweetened tea: yes, several cups, Etoh: once a week. 137med drink.  Maintain restoril dose Declined referral to grief counselor, she plans to use grief coaching video at this time      Relevant Medications   temazepam (RESTORIL) 7.5 MG capsule   Other Visit Diagnoses     Thrush, oral    -  Primary   Relevant Medications   nystatin (MYCOSTATIN) 100000 UNIT/ML suspension      Return in about 6 months (around 05/29/2023) for HTN, hyperlipidemia (fasting).     ChWilfred LacyNP

## 2022-11-28 NOTE — Patient Instructions (Signed)
Go to lab

## 2022-11-28 NOTE — Assessment & Plan Note (Signed)
>>  ASSESSMENT AND PLAN FOR CYSTITIS WRITTEN ON 11/28/2022  3:24 PM BY Charnika Herbst LUM, NP  Followed by [place] urology secondary to incomplete bladder emptying Gemtesa and uro-MP discontinued. Current use of estradial cream 3x/week per urology Plans to get urodynamic procedure by Dr. Shaunna

## 2022-11-28 NOTE — Assessment & Plan Note (Signed)
Chronic, onset several years ago, Able to fall asleep, bedtime at 10pm, Wakes up at 2am-3am and unable to return to sleep. no improvement with trazodone '150mg'$  or lmelatonin Some improvement with restoril 7.'5mg'$ , ongoing for several years Worse with death of husband 30month ago Admits to grief Coffee: no, Soda: 16oz, Unsweetened tea: yes, several cups, Etoh: once a week. 130med drink.  Maintain restoril dose Declined referral to grief counselor, she plans to use grief coaching video at this time

## 2022-11-28 NOTE — Assessment & Plan Note (Signed)
Repeat TSH and t4

## 2022-11-28 NOTE — Assessment & Plan Note (Signed)
Followed by [facility] secondary to incomplete bladder emptying Gemtesa and uro-MP discontinued. Current use of estradial cream 3x/week per urology Plans to get urodynamic procedure by Dr. Octavio Manns

## 2022-11-30 MED ORDER — LEVOTHYROXINE SODIUM 50 MCG PO TABS: 50.0000 ug | ORAL_TABLET | Freq: Every day | ORAL | 1 refills | Status: DC

## 2022-11-30 NOTE — Addendum Note (Signed)
Addended by: Leana Gamer on: 11/30/2022 08:50 PM   Modules accepted: Orders

## 2022-12-01 ENCOUNTER — Telehealth: Payer: Self-pay | Admitting: Nurse Practitioner

## 2022-12-01 ENCOUNTER — Ambulatory Visit (INDEPENDENT_AMBULATORY_CARE_PROVIDER_SITE_OTHER): Payer: Medicare PPO | Admitting: Urology

## 2022-12-01 VITALS — BP 186/93 | HR 60 | Ht 63.0 in | Wt 123.2 lb

## 2022-12-01 DIAGNOSIS — R339 Retention of urine, unspecified: Secondary | ICD-10-CM | POA: Diagnosis not present

## 2022-12-01 LAB — URINALYSIS, COMPLETE
Bilirubin, UA: NEGATIVE
Glucose, UA: NEGATIVE
Ketones, UA: NEGATIVE
Nitrite, UA: NEGATIVE
Protein,UA: NEGATIVE
RBC, UA: NEGATIVE
Specific Gravity, UA: 1.015 (ref 1.005–1.030)
Urobilinogen, Ur: 0.2 mg/dL (ref 0.2–1.0)
pH, UA: 6.5 (ref 5.0–7.5)

## 2022-12-01 LAB — MICROSCOPIC EXAMINATION: Epithelial Cells (non renal): >10 /hpf — AB (ref 0–10)

## 2022-12-01 LAB — BLADDER SCAN AMB NON-IMAGING

## 2022-12-01 MED ORDER — TRIMETHOPRIM 100 MG PO TABS: 100.0000 mg | ORAL_TABLET | Freq: Every day | ORAL | 11 refills | Status: DC

## 2022-12-01 NOTE — Telephone Encounter (Signed)
Left VM, adv pt to call back

## 2022-12-01 NOTE — Telephone Encounter (Signed)
Pt was prescribed temazepam (RESTORIL) 7.5 MG capsule [[id number]]. She was under the impression she would be able to take 1-2 capsules per day. In the directions it states Take 1 capsule (7.5 mg total) by mouth at bedtime as needed for sleep.   She would like the directions to state, 1-2 capsules per day.   [id number] Star[address] Manns, [address] AT [facility] OF [address], [place] [address] Phone: [phone]  Fax: [phone] DEA #: [id number]   Pt at [phone]

## 2022-12-01 NOTE — Progress Notes (Signed)
12/01/2022 10:02 AM   Patricia Peck 04-May-1946 [id number]  Referring provider: Flossie Buffy, NP [address] [place],  [place] [address]  Chief Complaint  Patient presents with   incomplete bladder emptying    HPI: Patient has recurrent bladder infections and had a negative cystoscopy.  She has been on [place].  She has had some positive cultures and some of her symptoms may be quite chronic.  She has been on Uribel in the past.  Logan Bores was stopped in the setting of incomplete bladder emptying with a residual 285 mL.  Patient has been off [place] for 3 weeks because of elevated residual and she has mild incontinence.  She really could not say what type it was and she denied urge incontinence stress incontinence and bedwetting and leakage well awareness.  Having said that she said she was completely dry on the Gemtesa  Her flow is reasonable and she feels empty even when she takes [place]  She gets 4-6 symptomatic infections a year with burning and frequency and foul-smelling urine that respond favorably to antibiotics.  She has not had a recent renal ultrasound but she did have a negative cystoscopy November 2023     PMH: Past Medical History:  Diagnosis Date   Allergy    Arthritis    hands   Asthma    uses an inhaler   Atrial fibrillation (HCC)    Atrial flutter (HCC)    Depression    Dysrhythmia    a-fib, on Xarelto   GERD (gastroesophageal reflux disease)    Hyperlipidemia    Hypertension    Hypothyroidism    Osteoporosis    Stroke ([place]) 2017   TIA   TIA (transient ischemic attack)     Surgical History: Past Surgical History:  Procedure Laterality Date   ABDOMINAL HYSTERECTOMY     COLONOSCOPY     MYRINGOTOMY WITH TUBE PLACEMENT Right 01/14/2016   Procedure: RIGHT MYRINGOTOMY WITH T-TUBE PLACEMENT;  Surgeon: Leta Baptist, MD;  Location: [facility];  Service: ENT;  Laterality: Right;   [place] SALPINGO OOPHERECTOMY Bilateral 02/14/2021   Procedure: XI ROBOTIC ASSISTED BILATERAL SALPINGO OOPHORECTOMY;  Surgeon: Everitt Amber, MD;  Location: [facility];  Service: Gynecology;  Laterality: Bilateral;   TEE WITHOUT CARDIOVERSION N/A 12/22/2012   Procedure: TRANSESOPHAGEAL ECHOCARDIOGRAM (TEE);  Surgeon: Lelon Perla, MD;  Location: [facility] ENDOSCOPY;  Service: Cardiovascular;  Laterality: N/A;    Home Medications:  Allergies as of 12/01/2022   No Known Allergies      Medication List        Accurate as of December 01, 2022 10:02 AM. If you have any questions, ask your nurse or doctor.          amLODipine 10 MG tablet Commonly known as: NORVASC Take 1 tablet (10 mg total) by mouth daily.   atorvastatin 80 MG tablet Commonly known as: LIPITOR Take 1 tablet (80 mg total) by mouth daily.   Azelastine-Fluticasone 137-50 MCG/ACT Susp Place 1 spray into both nostrils daily.   BEANO PO Take 1 tablet by mouth 2 (two) times daily.   bumetanide 1 MG tablet Commonly known as: BUMEX TAKE 1 TABLET(1 MG) BY MOUTH TWICE DAILY   Calcium Carbonate-Vitamin D 600-400 MG-UNIT tablet Take 1 tablet by mouth 2 (two) times daily.   Cranberry 450 MG Caps Take 2 capsules by mouth daily.   denosumab 60 MG/ML Soln injection Commonly known  as: PROLIA Inject 60 mg into the skin once. Administer in upper arm, thigh, or abdomen   estradiol 0.1 MG/GM vaginal cream Commonly known as: ESTRACE Place 1 Applicatorful vaginally 3 (three) times a week.   ezetimibe 10 MG tablet Commonly known as: ZETIA Take 1 tablet (10 mg total) by mouth daily.   levothyroxine 50 MCG tablet Commonly known as: SYNTHROID Take 1 tablet (50 mcg total) by mouth daily before breakfast.   metoprolol succinate 50 MG 24 hr tablet Commonly known as: TOPROL-XL Take 1 tablet (50 mg total) by mouth daily. Maintain upcoming appt for additional refill   montelukast 10 MG tablet Commonly known as:  SINGULAIR TAKE 1 TABLET(10 MG) BY MOUTH AT BEDTIME   NexIUM 40 MG capsule Generic drug: esomeprazole Take 1 capsule (40 mg total) by mouth daily at 12 noon.   nystatin 100000 UNIT/ML suspension Commonly known as: MYCOSTATIN Use as directed 5 mLs (500,000 Units total) in the mouth or throat 4 (four) times daily.   OVER THE COUNTER MEDICATION Take 2 tablets by mouth daily. Bounty  Hair,Nail, and Skin daily gummies.   potassium chloride 10 MEQ tablet Commonly known as: KLOR-CON TAKE 1 TABLET(10 MEQ) BY MOUTH TWICE DAILY   Rivaroxaban 15 MG Tabs tablet Commonly known as: XARELTO Take 1 tablet (15 mg total) by mouth daily with supper.   rOPINIRole 0.25 MG tablet Commonly known as: REQUIP TAKE 1 TABLET(0.25 MG) BY MOUTH AT BEDTIME   temazepam 7.5 MG capsule Commonly known as: RESTORIL Take 1 capsule (7.5 mg total) by mouth at bedtime as needed for sleep.   Vitamin D-3 125 MCG (5000 UT) Tabs Take 5,000 Units by mouth 3 (three) times a week. Tuesday Thursday Saturday   vitamin E 180 MG (400 UNITS) capsule Take 400 Units by mouth 3 (three) times a week. Tuesdays, Thursdays, and Saturdays        Allergies: No Known Allergies  Family History: Family History  Problem Relation Age of Onset   Breast cancer Mother    Drug abuse Son    Depression Son    Stroke Paternal Aunt    Heart disease Paternal Grandfather    Diabetes Maternal Grandfather    Colon cancer Neg Hx    Ovarian cancer Neg Hx    Endometrial cancer Neg Hx    Pancreatic cancer Neg Hx    Prostate cancer Neg Hx     Social History:  reports that she has never smoked. She has never been exposed to tobacco smoke. She has never used smokeless tobacco. She reports current alcohol use. She reports that she does not use drugs.  ROS:                                        Physical Exam: BP (!) 186/93   Pulse 60   Ht '5\' 3"'$  (1.6 m)   Wt 55.9 kg   BMI 21.83 kg/m   Constitutional:  Alert  and oriented, No acute distress. HEENT: [place] AT, moist mucus membranes.  Trachea midline, no masses.  Laboratory Data: Lab Results  Component Value Date   WBC 7.8 10/18/2021   HGB 13.2 10/18/2021   HCT 40.4 10/18/2021   MCV 90 10/18/2021   PLT 328 10/18/2021    Lab Results  Component Value Date   CREATININE 0.94 09/12/2022    No results found for: "PSA"  No results found for: "  TESTOSTERONE"  Lab Results  Component Value Date   HGBA1C 5.7 09/07/2017    Urinalysis    Component Value Date/Time   COLORURINE YELLOW 07/17/2022 1647   APPEARANCEUR Clear 11/07/2022 1001   LABSPEC <=1.005 (A) 07/17/2022 1647   PHURINE 7.0 07/17/2022 1647   GLUCOSEU Negative 11/07/2022 1001   GLUCOSEU NEGATIVE 07/17/2022 1647   HGBUR NEGATIVE 07/17/2022 1647   BILIRUBINUR Negative 11/07/2022 1001   KETONESUR NEGATIVE 07/17/2022 1647   PROTEINUR Negative 11/07/2022 1001   PROTEINUR NEGATIVE 02/24/2022 1257   UROBILINOGEN 0.2 07/17/2022 1647   UROBILINOGEN 0.2 07/17/2022 1600   NITRITE Negative 11/07/2022 1001   NITRITE NEGATIVE 07/17/2022 1647   LEUKOCYTESUR Negative 11/07/2022 1001   LEUKOCYTESUR MODERATE (A) 07/17/2022 1647    Pertinent Imaging: Urine reviewed and sent for culture  Assessment & Plan: Patient gets recurrent urinary tract infections and has had an elevated residual.  Role of prophylaxis and renal ultrasound discussed.  Call if culture positive.  Call if ultrasound abnormal.  She might down regulate on suppression therapy.  We can always go back to [facility] and follow her for elevated residuals long-term.  Postvoid residual today was 114 mL.  I will get another 1 as a good baseline on trimethoprim suppression therapy in 6 weeks.  We then could start Gemtesa if needed and keep an eye in the residuals  1. Incomplete bladder emptying  - Urinalysis, Complete   No follow-ups on file.  Reece Packer, MD  [place] [address], [place], [place] [address] [phone]

## 2022-12-02 NOTE — Telephone Encounter (Signed)
Pt advised via detailed VM

## 2022-12-03 ENCOUNTER — Ambulatory Visit
Admission: RE | Admit: 2022-12-03 | Discharge: 2022-12-03 | Disposition: A | Discharge status: Home / Self Care | Payer: Medicare PPO | Source: Ambulatory Visit | Attending: Urology | Admitting: Urology

## 2022-12-03 DIAGNOSIS — N390 Urinary tract infection, site not specified: Secondary | ICD-10-CM | POA: Diagnosis not present

## 2022-12-03 DIAGNOSIS — R339 Retention of urine, unspecified: Secondary | ICD-10-CM

## 2022-12-04 LAB — CULTURE, URINE COMPREHENSIVE

## 2022-12-05 ENCOUNTER — Telehealth: Payer: Self-pay | Admitting: *Deleted

## 2022-12-05 NOTE — Telephone Encounter (Signed)
-----   Message from Bjorn Loser, MD sent at 12/04/2022  2:47 PM EST ----- Macrodantin 100 mg bid for 7 days ----- Message ----- From: Despina Hidden, CMA Sent: 12/04/2022   2:10 PM EST To: Bjorn Loser, MD   ----- Message ----- From: Interface, [facility] Lab Results In Sent: 12/01/2022  11:36 AM EST To: [facility]

## 2022-12-05 NOTE — Telephone Encounter (Signed)
No VM and no answering machine, will try again later.

## 2022-12-11 MED ORDER — NITROFURANTOIN MONOHYD MACRO 100 MG PO CAPS: 100.0000 mg | ORAL_CAPSULE | Freq: Two times a day (BID) | ORAL | 0 refills | Status: AC

## 2022-12-11 NOTE — Telephone Encounter (Signed)
Spoke with patient and advised results rx sent to pharmacy by e-script  

## 2022-12-31 DIAGNOSIS — L820 Inflamed seborrheic keratosis: Secondary | ICD-10-CM | POA: Diagnosis not present

## 2022-12-31 DIAGNOSIS — X32XXXD Exposure to sunlight, subsequent encounter: Secondary | ICD-10-CM | POA: Diagnosis not present

## 2022-12-31 DIAGNOSIS — Z1283 Encounter for screening for malignant neoplasm of skin: Secondary | ICD-10-CM | POA: Diagnosis not present

## 2022-12-31 DIAGNOSIS — L570 Actinic keratosis: Secondary | ICD-10-CM | POA: Diagnosis not present

## 2022-12-31 DIAGNOSIS — D225 Melanocytic nevi of trunk: Secondary | ICD-10-CM | POA: Diagnosis not present

## 2023-01-15 ENCOUNTER — Telehealth: Payer: Self-pay | Admitting: Pharmacy Technician

## 2023-01-15 NOTE — Telephone Encounter (Signed)
Please initiate Prolia BIV and PA for patient- she is already on therapy.  Thanks!

## 2023-01-16 DIAGNOSIS — L03115 Cellulitis of right lower limb: Secondary | ICD-10-CM | POA: Diagnosis not present

## 2023-01-16 DIAGNOSIS — S8001XA Contusion of right knee, initial encounter: Secondary | ICD-10-CM | POA: Diagnosis not present

## 2023-01-19 NOTE — Telephone Encounter (Signed)
Prolia VOB initiated via [email or web address]  Last Prolia inj: 07/31/22 Next Prolia inj DUE: 01/28/23

## 2023-01-23 ENCOUNTER — Other Ambulatory Visit (HOSPITAL_COMMUNITY): Payer: Self-pay

## 2023-01-23 NOTE — Telephone Encounter (Signed)
Pharmacy Patient Advocate Encounter  Insurance verification completed.    The patient is insured through [employer] for: Prolia 60mg .  Pharmacy benefit copay: $64.00   **This test claim was processed through [facility] amounts may vary at other pharmacies due to pharmacy/plan contracts, or as the patient moves through the different stages of their insurance plan.**

## 2023-01-26 ENCOUNTER — Ambulatory Visit (INDEPENDENT_AMBULATORY_CARE_PROVIDER_SITE_OTHER): Payer: Medicare PPO | Admitting: Urology

## 2023-01-26 ENCOUNTER — Encounter: Payer: Self-pay | Admitting: Urology

## 2023-01-26 VITALS — BP 189/75 | HR 50 | Ht 63.0 in | Wt 123.0 lb

## 2023-01-26 DIAGNOSIS — R339 Retention of urine, unspecified: Secondary | ICD-10-CM | POA: Diagnosis not present

## 2023-01-26 LAB — URINALYSIS, COMPLETE
Bilirubin, UA: NEGATIVE
Glucose, UA: NEGATIVE
Ketones, UA: NEGATIVE
Nitrite, UA: NEGATIVE
Protein,UA: NEGATIVE
RBC, UA: NEGATIVE
Specific Gravity, UA: 1.02 (ref 1.005–1.030)
Urobilinogen, Ur: 0.2 mg/dL (ref 0.2–1.0)
pH, UA: 6 (ref 5.0–7.5)

## 2023-01-26 LAB — BLADDER SCAN AMB NON-IMAGING: Scan Result: 68

## 2023-01-26 LAB — MICROSCOPIC EXAMINATION: Epithelial Cells (non renal): >10 /hpf — AB (ref 0–10)

## 2023-01-26 NOTE — Progress Notes (Signed)
01/26/2023 10:14 AM   [name] [birth year] [id number]  Referring provider: Flossie Buffy, NP [address] [place],  [place] [address]  Chief Complaint  Patient presents with   Follow-up    8 week follow-up     HPI: Patient has recurrent bladder infections and had a negative cystoscopy.  She has been on [place].  She has had some positive cultures and some of her symptoms may be quite chronic.  She has been on Uribel in the past.  Logan Bores was stopped in the setting of incomplete bladder emptying with a residual 285 mL.   Patient has been off [place] for 3 weeks because of elevated residual and she has mild incontinence.  She really could not say what type it was and she denied urge incontinence stress incontinence and bedwetting and leakage well awareness.  Having said that she said she was completely dry on the Gemtesa   Her flow is reasonable and she feels empty even when she takes [place]   She gets 4-6 symptomatic infections a year with burning and frequency and foul-smelling urine that respond favorably to antibiotics.  She has not had a recent renal ultrasound but she did have a negative cystoscopy November 2023    Patient gets recurrent urinary tract infections and has had an elevated residual.  Role of prophylaxis and renal ultrasound discussed.  Call if culture positive.  Call if ultrasound abnormal.  She might down regulate on suppression therapy.  We can always go back to [facility] and follow her for elevated residuals long-term.  Postvoid residual today was 114 mL.  I will get another 1 as a good baseline on trimethoprim suppression therapy in 6 weeks.  We then could start Gemtesa if needed and keep an eye in the residuals   Today Frequency stable.  Ultrasound normal.  Last culture was positive Clinically not infected.  She says she wears 1 pad for confidence at least a little bit.     PMH: Past Medical History:  Diagnosis Date   Allergy     Arthritis    hands   Asthma    uses an inhaler   Atrial fibrillation (HCC)    Atrial flutter (HCC)    Depression    Dysrhythmia    a-fib, on Xarelto   GERD (gastroesophageal reflux disease)    Hyperlipidemia    Hypertension    Hypothyroidism    Osteoporosis    Stroke ([place]) 2017   TIA   TIA (transient ischemic attack)     Surgical History: Past Surgical History:  Procedure Laterality Date   ABDOMINAL HYSTERECTOMY     COLONOSCOPY     MYRINGOTOMY WITH TUBE PLACEMENT Right 01/14/2016   Procedure: RIGHT MYRINGOTOMY WITH T-TUBE PLACEMENT;  Surgeon: Leta Baptist, MD;  Location: [facility];  Service: ENT;  Laterality: Right;   [place] OOPHERECTOMY Bilateral 02/14/2021   Procedure: XI ROBOTIC ASSISTED BILATERAL SALPINGO OOPHORECTOMY;  Surgeon: Everitt Amber, MD;  [facility];  Service: Gynecology;  Laterality: Bilateral;   TEE WITHOUT CARDIOVERSION N/A 12/22/2012   Procedure: TRANSESOPHAGEAL ECHOCARDIOGRAM (TEE);  Surgeon: Lelon Perla, MD;  Location: [facility] ENDOSCOPY;  Service: Cardiovascular;  Laterality: N/A;    Home Medications:  Allergies as of 01/26/2023   No Known Allergies      Medication List        Accurate as of January 26, 2023 10:14 AM. If you have any  questions, ask your nurse or doctor.          amLODipine 10 MG tablet Commonly known as: NORVASC Take 1 tablet (10 mg total) by mouth daily.   atorvastatin 80 MG tablet Commonly known as: LIPITOR Take 1 tablet (80 mg total) by mouth daily.   Azelastine-Fluticasone 137-50 MCG/ACT Susp Place 1 spray into both nostrils daily.   BEANO PO Take 1 tablet by mouth 2 (two) times daily.   bumetanide 1 MG tablet Commonly known as: BUMEX TAKE 1 TABLET(1 MG) BY MOUTH TWICE DAILY   Calcium Carbonate-Vitamin D 600-400 MG-UNIT tablet Take 1 tablet by mouth 2 (two) times daily.   Cranberry 450 MG Caps Take 2 capsules by mouth daily.    denosumab 60 MG/ML Soln injection Commonly known as: PROLIA Inject 60 mg into the skin once. Administer in upper arm, thigh, or abdomen   estradiol 0.1 MG/GM vaginal cream Commonly known as: ESTRACE Place 1 Applicatorful vaginally 3 (three) times a week.   ezetimibe 10 MG tablet Commonly known as: ZETIA Take 1 tablet (10 mg total) by mouth daily.   levothyroxine 50 MCG tablet Commonly known as: SYNTHROID Take 1 tablet (50 mcg total) by mouth daily before breakfast.   metoprolol succinate 50 MG 24 hr tablet Commonly known as: TOPROL-XL Take 1 tablet (50 mg total) by mouth daily. Maintain upcoming appt for additional refill   montelukast 10 MG tablet Commonly known as: SINGULAIR TAKE 1 TABLET(10 MG) BY MOUTH AT BEDTIME   NexIUM 40 MG capsule Generic drug: esomeprazole Take 1 capsule (40 mg total) by mouth daily at 12 noon.   nystatin 100000 UNIT/ML suspension Commonly known as: MYCOSTATIN Use as directed 5 mLs (500,000 Units total) in the mouth or throat 4 (four) times daily.   OVER THE COUNTER MEDICATION Take 2 tablets by mouth daily. Bounty  Hair,Nail, and Skin daily gummies.   potassium chloride 10 MEQ tablet Commonly known as: KLOR-CON TAKE 1 TABLET(10 MEQ) BY MOUTH TWICE DAILY   Rivaroxaban 15 MG Tabs tablet Commonly known as: XARELTO Take 1 tablet (15 mg total) by mouth daily with supper.   rOPINIRole 0.25 MG tablet Commonly known as: REQUIP TAKE 1 TABLET(0.25 MG) BY MOUTH AT BEDTIME   temazepam 7.5 MG capsule Commonly known as: RESTORIL Take 1 capsule (7.5 mg total) by mouth at bedtime as needed for sleep.   trimethoprim 100 MG tablet Commonly known as: TRIMPEX Take 1 tablet (100 mg total) by mouth daily.   Vitamin D-3 125 MCG (5000 UT) Tabs Take 5,000 Units by mouth 3 (three) times a week. Tuesday Thursday Saturday   vitamin E 180 MG (400 UNITS) capsule Take 400 Units by mouth 3 (three) times a week. Tuesdays, Thursdays, and Saturdays         Allergies: No Known Allergies  Family History: Family History  Problem Relation Age of Onset   Breast cancer Mother    Drug abuse Son    Depression Son    Stroke Paternal Aunt    Heart disease Paternal Grandfather    Diabetes Maternal Grandfather    Colon cancer Neg Hx    Ovarian cancer Neg Hx    Endometrial cancer Neg Hx    Pancreatic cancer Neg Hx    Prostate cancer Neg Hx     Social History:  reports that she has never smoked. She has never been exposed to tobacco smoke. She has never used smokeless tobacco. She reports current alcohol use. She reports that she does  not use drugs.  ROS:                                        Physical Exam: There were no vitals taken for this visit.  Constitutional:  Alert and oriented, No acute distress. HEENT: [place] AT, moist mucus membranes.  Trachea midline, no masses.  Laboratory Data: Lab Results  Component Value Date   WBC 7.8 10/18/2021   HGB 13.2 10/18/2021   HCT 40.4 10/18/2021   MCV 90 10/18/2021   PLT 328 10/18/2021    Lab Results  Component Value Date   CREATININE 0.94 09/12/2022    No results found for: "PSA"  No results found for: "TESTOSTERONE"  Lab Results  Component Value Date   HGBA1C 5.7 09/07/2017    Urinalysis    Component Value Date/Time   COLORURINE YELLOW 07/17/2022 1647   APPEARANCEUR Hazy (A) 12/01/2022 0933   LABSPEC <=1.005 (A) 07/17/2022 1647   PHURINE 7.0 07/17/2022 1647   GLUCOSEU Negative 12/01/2022 0933   GLUCOSEU NEGATIVE 07/17/2022 1647   HGBUR NEGATIVE 07/17/2022 1647   BILIRUBINUR Negative 12/01/2022 0933   KETONESUR NEGATIVE 07/17/2022 1647   PROTEINUR Negative 12/01/2022 0933   PROTEINUR NEGATIVE 02/24/2022 1257   UROBILINOGEN 0.2 07/17/2022 1647   UROBILINOGEN 0.2 07/17/2022 1600   NITRITE Negative 12/01/2022 0933   NITRITE NEGATIVE 07/17/2022 1647   LEUKOCYTESUR 1+ (A) 12/01/2022 0933   LEUKOCYTESUR MODERATE (A) 07/17/2022 1647    Pertinent  Imaging:   Assessment & Plan: I will see her back in 4 months on trimethoprim.  She is okay not starting Gemtesa but we always can add it again then and we talked about this today.  Residual today 68 mL and will double check this next time.  Call if urine culture positive  1. Incomplete bladder emptying  - Urinalysis, Complete - BLADDER SCAN AMB NON-IMAGING   No follow-ups on file.  Reece Packer, MD  [place] [address] [place], [place] [address] [phone]

## 2023-01-26 NOTE — Progress Notes (Unsigned)
Per orders of Charttote Nche pt is here to receive Prolia Injection, pt received Prolia injection in Left arm SubQ. Pt tolerated Prolia Injection well. Pt will return in 6 months for next Prolia Injection.    Drop these charges: [phone]

## 2023-01-29 ENCOUNTER — Ambulatory Visit (INDEPENDENT_AMBULATORY_CARE_PROVIDER_SITE_OTHER): Payer: Medicare PPO

## 2023-01-29 DIAGNOSIS — M810 Age-related osteoporosis without current pathological fracture: Secondary | ICD-10-CM | POA: Diagnosis not present

## 2023-01-29 MED ORDER — DENOSUMAB 60 MG/ML ~~LOC~~ SOSY
60.0000 mg | PREFILLED_SYRINGE | Freq: Once | SUBCUTANEOUS | Status: AC
  Administered 2023-01-29: 60 mg via SUBCUTANEOUS

## 2023-02-03 NOTE — Telephone Encounter (Signed)
Pharmacy Patient Advocate Encounter   Received notification that prior authorization for Prolia is required/requested for medical benefit.     PA submitted on 02/03/23 to (ins) [employer] via CoverMyMeds Key # [id number] Status is pending

## 2023-02-11 ENCOUNTER — Telehealth: Payer: Self-pay

## 2023-02-11 NOTE — Progress Notes (Signed)
Care Management & Coordination Services Pharmacy Team  Reason for Encounter: Hypertension  Contacted patient to discuss hypertension disease state.  Spoke with patient on 02/11/2023     Current antihypertensive regimen:  Amlodipine 10 mg daily  Bumetanide 1 mg twice daily  Metoprolol Xl 50 mg daily    Patient verbally confirms she is taking the above medications as directed. Yes  How often are you checking your Blood Pressure? infrequently  Current home BP readings: Patient reports she is not sure what her blood pressure ranges around.  Wrist or arm cuff: Patient states she uses arm cuff.  Caffeine intake: Patient states she does not have a lot of caffeine intake.  Salt intake: Patient sates she limits how much salt intake she has.  OTC medications including pseudoephedrine or NSAIDs?  Any readings above 180/100?  Patient reports she is not sure what her blood pressure ranges around.  What recent interventions/DTPs have been made by any provider to improve Blood Pressure control since last CPP Visit: None ID  Any recent hospitalizations or ED visits since last visit with CPP? No  What diet changes have been made to improve Blood Pressure Control?  None ID  What exercise is being done to improve your Blood Pressure Control?  None ID  Adherence Review: Is the patient currently on ACE/ARB medication? No Does the patient have >5 day gap between last estimated fill dates? No  Care Gaps: COVID-19 Vaccine  Star Rating Drugs:  Atorvastatin 80 mg last filled 01/24/2023 90 day supply at [facility].   Chart Updates: Recent office visits:  11/28/2022 Alysia Penna NP (PCP) No Medication changes noted, Return in about 6 months   Recent consult visits:  01/26/2023 Dr. Sherron Monday MD (Urology) No Medication changes noted 01/16/2023 Arnette Felts PA-C (Urgent Care) No Medication changes noted 12/01/2022 Dr. Sherron Monday MD (Urology) Start trimethoprim [facility]) 100  MG tablet daily, Return for 8 week follow-up  11/20/2022 Dr. Jens Som MD (Cardiology) No Medication changes noted 11/07/2022 Carman Ching PA-C (Urology) No Medication changes noted  Hospital visits:  None in previous 6 months  Medications: Outpatient Encounter Medications as of 02/11/2023  Medication Sig   Alpha-D-Galactosidase (BEANO PO) Take 1 tablet by mouth 2 (two) times daily.   amLODipine (NORVASC) 10 MG tablet Take 1 tablet (10 mg total) by mouth daily.   atorvastatin (LIPITOR) 80 MG tablet Take 1 tablet (80 mg total) by mouth daily.   Azelastine-Fluticasone 137-50 MCG/ACT SUSP Place 1 spray into both nostrils daily.   bumetanide (BUMEX) 1 MG tablet TAKE 1 TABLET(1 MG) BY MOUTH TWICE DAILY   Calcium Carbonate-Vitamin D 600-400 MG-UNIT tablet Take 1 tablet by mouth 2 (two) times daily.   Cholecalciferol (VITAMIN D-3) 5000 UNITS TABS Take 5,000 Units by mouth 3 (three) times a week. Tuesday Thursday Saturday   Cranberry 450 MG CAPS Take 2 capsules by mouth daily.   denosumab (PROLIA) 60 MG/ML SOLN injection Inject 60 mg into the skin once. Administer in upper arm, thigh, or abdomen   estradiol (ESTRACE) 0.1 MG/GM vaginal cream Place 1 Applicatorful vaginally 3 (three) times a week.   ezetimibe (ZETIA) 10 MG tablet Take 1 tablet (10 mg total) by mouth daily.   levothyroxine (SYNTHROID) 50 MCG tablet Take 1 tablet (50 mcg total) by mouth daily before breakfast.   metoprolol succinate (TOPROL-XL) 50 MG 24 hr tablet Take 1 tablet (50 mg total) by mouth daily. Maintain upcoming appt for additional refill   montelukast (SINGULAIR) 10 MG tablet TAKE 1  TABLET(10 MG) BY MOUTH AT BEDTIME   NEXIUM 40 MG capsule Take 1 capsule (40 mg total) by mouth daily at 12 noon.   nystatin (MYCOSTATIN) 100000 UNIT/ML suspension Use as directed 5 mLs (500,000 Units total) in the mouth or throat 4 (four) times daily.   OVER THE COUNTER MEDICATION Take 2 tablets by mouth daily. Bounty  Hair,Nail, and  Skin daily gummies.   potassium chloride (KLOR-CON) 10 MEQ tablet TAKE 1 TABLET(10 MEQ) BY MOUTH TWICE DAILY   Rivaroxaban (XARELTO) 15 MG TABS tablet Take 1 tablet (15 mg total) by mouth daily with supper.   rOPINIRole (REQUIP) 0.25 MG tablet TAKE 1 TABLET(0.25 MG) BY MOUTH AT BEDTIME   temazepam (RESTORIL) 7.5 MG capsule Take 1 capsule (7.5 mg total) by mouth at bedtime as needed for sleep.   trimethoprim (TRIMPEX) 100 MG tablet Take 1 tablet (100 mg total) by mouth daily.   vitamin E 180 MG (400 UNITS) capsule Take 400 Units by mouth 3 (three) times a week. Tuesdays, Thursdays, and Saturdays   No facility-administered encounter medications on file as of 02/11/2023.    Recent Office Vitals: BP Readings from Last 3 Encounters:  01/26/23 (!) 189/75  12/01/22 (!) 186/93  11/28/22 102/60   Pulse Readings from Last 3 Encounters:  01/26/23 (!) 50  12/01/22 60  11/28/22 60    Wt Readings from Last 3 Encounters:  01/26/23 123 lb (55.8 kg)  12/01/22 123 lb 4 oz (55.9 kg)  11/28/22 123 lb 6.4 oz (56 kg)     Kidney Function Lab Results  Component Value Date/Time   CREATININE 0.94 09/12/2022 09:32 AM   CREATININE 0.97 03/21/2022 10:00 AM   CREATININE 0.98 (H) 05/23/2019 02:29 PM   CREATININE 0.83 10/04/2014 12:16 PM   GFR 69.64 07/19/2021 11:08 AM   GFRNONAA >60 02/12/2021 08:48 AM   GFRNONAA 72 10/04/2014 12:16 PM   GFRAA 59 (L) 03/26/2020 10:23 AM   GFRAA 83 10/04/2014 12:16 PM       Latest Ref Rng & Units 09/12/2022    9:32 AM 03/21/2022   10:00 AM 03/10/2022    9:33 AM  BMP  Glucose 70 - 99 mg/dL 86  75  79   BUN 8 - 27 mg/dL 12  13  12    Creatinine 0.57 - 1.00 mg/dL 6.26  9.48  5.46   BUN/Creat Ratio 12 - 28 13  13  13    Sodium 134 - 144 mmol/L 143  138  141   Potassium 3.5 - 5.2 mmol/L 4.0  3.8  3.4   Chloride 96 - 106 mmol/L 104  99  103   CO2 20 - 29 mmol/L 26  25  25    Calcium 8.7 - 10.3 mg/dL 9.4  9.1  9.3      [facility] Clinical Pharmacist  Assistant [phone]

## 2023-04-01 ENCOUNTER — Ambulatory Visit: Payer: Medicare PPO | Admitting: Physician Assistant

## 2023-04-01 DIAGNOSIS — N3000 Acute cystitis without hematuria: Secondary | ICD-10-CM | POA: Diagnosis not present

## 2023-04-01 DIAGNOSIS — R350 Frequency of micturition: Secondary | ICD-10-CM | POA: Diagnosis not present

## 2023-04-01 DIAGNOSIS — N3946 Mixed incontinence: Secondary | ICD-10-CM | POA: Diagnosis not present

## 2023-04-06 ENCOUNTER — Other Ambulatory Visit: Payer: Self-pay | Admitting: Cardiology

## 2023-04-06 DIAGNOSIS — I480 Paroxysmal atrial fibrillation: Secondary | ICD-10-CM

## 2023-04-06 NOTE — Telephone Encounter (Signed)
Prescription refill request for Xarelto received.  Indication:AFIB Last office visit:1/24 Weight:55.8  kg Age:77 Scr:0.94  11/23 CrCl:44.15  ml/min  Prescription refilled

## 2023-04-13 ENCOUNTER — Other Ambulatory Visit: Payer: Self-pay | Admitting: Cardiology

## 2023-04-13 ENCOUNTER — Other Ambulatory Visit: Payer: Self-pay | Admitting: Nurse Practitioner

## 2023-04-13 DIAGNOSIS — E876 Hypokalemia: Secondary | ICD-10-CM

## 2023-04-13 DIAGNOSIS — I10 Essential (primary) hypertension: Secondary | ICD-10-CM

## 2023-04-13 DIAGNOSIS — E7800 Pure hypercholesterolemia, unspecified: Secondary | ICD-10-CM

## 2023-04-14 ENCOUNTER — Other Ambulatory Visit: Payer: Self-pay | Admitting: *Deleted

## 2023-04-14 ENCOUNTER — Other Ambulatory Visit: Payer: Self-pay | Admitting: Cardiology

## 2023-04-14 DIAGNOSIS — E876 Hypokalemia: Secondary | ICD-10-CM

## 2023-04-14 DIAGNOSIS — I10 Essential (primary) hypertension: Secondary | ICD-10-CM

## 2023-04-14 DIAGNOSIS — I480 Paroxysmal atrial fibrillation: Secondary | ICD-10-CM

## 2023-04-14 MED ORDER — METOPROLOL SUCCINATE ER 50 MG PO TB24: 50.0000 mg | ORAL_TABLET | Freq: Every day | ORAL | 0 refills | Status: DC

## 2023-04-14 NOTE — Telephone Encounter (Signed)
Needs an appointment for labs

## 2023-05-17 NOTE — Progress Notes (Signed)
Cardiology Clinic Note   Patient Name: Patricia Peck Date of Encounter: 05/19/2023  Primary Care Provider:  Anne Ng, NP Primary Cardiologist:  Olga Millers, MD  Patient Profile    77 year old female with history of paroxysmal atrial fibrillation, hypertension, hyperlipidemia, TIA, mild mitral valve prolapse with mild mitral regurgitation, mild nonobstructive CAD with a cardiac CTA October 2020 with a calcium score of 7, and occasional palpitations.  Last seen by Dr. Jens Som on 11/20/2022.  Past Medical History    Past Medical History:  Diagnosis Date   Allergy    Arthritis    hands   Asthma    uses an inhaler   Atrial fibrillation (HCC)    Atrial flutter (HCC)    Depression    Dysrhythmia    a-fib, on Xarelto   GERD (gastroesophageal reflux disease)    Hyperlipidemia    Hypertension    Hypothyroidism    Osteoporosis    Stroke (HCC) 2017   TIA   TIA (transient ischemic attack)    Past Surgical History:  Procedure Laterality Date   ABDOMINAL HYSTERECTOMY     COLONOSCOPY     MYRINGOTOMY WITH TUBE PLACEMENT Right 01/14/2016   Procedure: RIGHT MYRINGOTOMY WITH T-TUBE PLACEMENT;  Surgeon: Newman Pies, MD;  Location: [facility];  Service: ENT;  Laterality: Right;   PARTIAL HYSTERECTOMY  1989   ROBOTIC ASSISTED BILATERAL SALPINGO OOPHERECTOMY Bilateral 02/14/2021   Procedure: XI ROBOTIC ASSISTED BILATERAL SALPINGO OOPHORECTOMY;  Surgeon: Adolphus Birchwood, MD;  Location: [facility];  Service: Gynecology;  Laterality: Bilateral;   TEE WITHOUT CARDIOVERSION N/A 12/22/2012   Procedure: TRANSESOPHAGEAL ECHOCARDIOGRAM (TEE);  Surgeon: [name], MD;  Location: [facility];  Service: Cardiovascular;  Laterality: N/A;    Allergies  No Known Allergies  History of Present Illness    Patricia Peck comes today without any cardiac complaints.  She she continues to grieve the death of her husband and is trying to make decisions concerning selling her house and  downsizing.  She is under some stress concerning her son who also lost his job at the same time her husband died.  She denies any chest pain, palpitations, dizziness dyspnea on exertion or significant fatigue despite all of the stressors.  She continues to be medically compliant.  Labs are followed by her primary care provider who will be drawing labs next week.  Home Medications    Current Outpatient Medications  Medication Sig Dispense Refill   Alpha-D-Galactosidase (BEANO PO) Take 1 tablet by mouth 2 (two) times daily.     atorvastatin (LIPITOR) 80 MG tablet TAKE 1 TABLET(80 MG) BY MOUTH DAILY 90 tablet 3   Azelastine-Fluticasone 137-50 MCG/ACT SUSP Place 1 spray into both nostrils daily.     bumetanide (BUMEX) 1 MG tablet TAKE 1 TABLET(1 MG) BY MOUTH TWICE DAILY 180 tablet 1   Calcium Carbonate-Vitamin D 600-400 MG-UNIT tablet Take 1 tablet by mouth 2 (two) times daily.     Cholecalciferol (VITAMIN D-3) 5000 UNITS TABS Take 5,000 Units by mouth 3 (three) times a week. Tuesday Thursday Saturday     Cranberry 450 MG CAPS Take 2 capsules by mouth daily.     denosumab (PROLIA) 60 MG/ML SOLN injection Inject 60 mg into the skin once. Administer in upper arm, thigh, or abdomen 180 mL 0   estradiol (ESTRACE) 0.1 MG/GM vaginal cream Place 1 Applicatorful vaginally 3 (three) times a week.     ezetimibe (ZETIA) 10 MG tablet Take 1 tablet (10 mg  total) by mouth daily. 90 tablet 3   levothyroxine (SYNTHROID) 50 MCG tablet Take 1 tablet (50 mcg total) by mouth daily before breakfast. 90 tablet 1   metoprolol succinate (TOPROL-XL) 50 MG 24 hr tablet Take 1 tablet (50 mg total) by mouth daily. Maintain upcoming appt for additional refill 90 tablet 0   montelukast (SINGULAIR) 10 MG tablet TAKE 1 TABLET(10 MG) BY MOUTH AT BEDTIME 90 tablet 1   NEXIUM 40 MG capsule Take 1 capsule (40 mg total) by mouth daily at 12 noon. 90 capsule 1   nystatin (MYCOSTATIN) 100000 UNIT/ML suspension Use as directed 5 mLs  (500,000 Units total) in the mouth or throat 4 (four) times daily. 120 mL 1   OVER THE COUNTER MEDICATION Take 2 tablets by mouth daily. Bounty  Hair,Nail, and Skin daily gummies.     potassium chloride (KLOR-CON) 10 MEQ tablet Take 1 tablet (10 mEq total) by mouth 2 (two) times daily. 180 tablet 3   rOPINIRole (REQUIP) 0.25 MG tablet TAKE 1 TABLET(0.25 MG) BY MOUTH AT BEDTIME 90 tablet 3   temazepam (RESTORIL) 7.5 MG capsule Take 1 capsule (7.5 mg total) by mouth at bedtime as needed for sleep. 30 capsule 5   trimethoprim (TRIMPEX) 100 MG tablet Take 1 tablet (100 mg total) by mouth daily. 30 tablet 11   vitamin E 180 MG (400 UNITS) capsule Take 400 Units by mouth 3 (three) times a week. Tuesdays, Thursdays, and Saturdays     XARELTO 15 MG TABS tablet TAKE 1 TABLET(15 MG) BY MOUTH DAILY WITH SUPPER 90 tablet 1   amLODipine (NORVASC) 10 MG tablet Take 1 tablet (10 mg total) by mouth daily. 90 tablet 3   No current facility-administered medications for this visit.     Family History    Family History  Problem Relation Age of Onset   Breast cancer Mother    Drug abuse Son    Depression Son    Stroke Paternal Aunt    Heart disease Paternal Grandfather    Diabetes Maternal Grandfather    Colon cancer Neg Hx    Ovarian cancer Neg Hx    Endometrial cancer Neg Hx    Pancreatic cancer Neg Hx    Prostate cancer Neg Hx    She indicated that her mother is deceased. She indicated that her father is alive. She indicated that all of her three sisters are alive. She indicated that her maternal grandfather is deceased. She indicated that her paternal grandfather is deceased. She indicated that her son is alive. She indicated that the status of her paternal aunt is unknown. She indicated that the status of her neg hx is unknown.  Social History    Social History   Socioeconomic History   Marital status: Widowed    Spouse name: Not on file   Number of children: 2   Years of education: Not on  file   Highest education level: Not on file  Occupational History    Employer: RETIRED  Tobacco Use   Smoking status: Never    Passive exposure: Never   Smokeless tobacco: Never  Vaping Use   Vaping status: Never Used  Substance and Sexual Activity   Alcohol use: Yes    Comment: Rare   Drug use: No   Sexual activity: Not Currently  Other Topics Concern   Not on file  Social History Narrative   Does have [name].   Does desire life support if not futile.  Not sure about feeding tubes.   Social Determinants of Health   Financial Resource Strain: Low Risk  (10/21/2022)   Overall Financial Resource Strain (CARDIA)    Difficulty of Paying Living Expenses: Not hard at all  Food Insecurity: No Food Insecurity (10/21/2022)   Hunger Vital Sign    Worried About Running Out of Food in the Last Year: Never true    Ran Out of Food in the Last Year: Never true  Transportation Needs: No Transportation Needs (10/21/2022)   PRAPARE - Administrator, Civil Service (Medical): No    Lack of Transportation (Non-Medical): No  Physical Activity: Inactive (10/21/2022)   Exercise Vital Sign    Days of Exercise per Week: 0 days    Minutes of Exercise per Session: 0 min  Stress: No Stress Concern Present (10/21/2022)   [employer] of Occupational Health - Occupational Stress Questionnaire    Feeling of Stress : Not at all  Social Connections: Unknown (03/14/2022)   [facility]   Social Network    Social Network: Not on file  Intimate Partner Violence: Unknown (02/03/2022)   Received from [facility], [facility]   HITS    Physically Hurt: Not on file    Insult or Talk Down To: Not on file    Threaten Physical Harm: Not on file    Scream or Curse: Not on file     Review of Systems    General:  No chills, fever, night sweats or weight changes.  Cardiovascular:  No chest pain, dyspnea on exertion, edema, orthopnea,  palpitations, paroxysmal nocturnal dyspnea. Dermatological: No rash, lesions/masses Respiratory: No cough, dyspnea Urologic: No hematuria, dysuria Abdominal:   No nausea, vomiting, diarrhea, bright red blood per rectum, melena, or hematemesis Neurologic:  No visual changes, wkns, changes in mental status. All other systems reviewed and are otherwise negative except as noted above.  EKG Interpretation Date/Time:  Tuesday May 19 2023 11:15:43 EDT Ventricular Rate:  53 PR Interval:  212 QRS Duration:  94 QT Interval:  490 QTC Calculation: 459 R Axis:   -2  Text Interpretation: Sinus bradycardia with 1st degree A-V block When compared with ECG of 09-Jan-2016 11:13, Premature supraventricular complexes are no longer Present Confirmed by Joni Reining [phone]) on 05/19/2023 11:26:50 AM    Physical Exam    VS:  BP 120/76   Pulse (!) 53   Ht 5\' 3"  (1.6 m)   Wt 122 lb 9.6 oz (55.6 kg)   SpO2 98%   BMI 21.72 kg/m  , BMI Body mass index is 21.72 kg/m.     GEN: Well nourished, well developed, in no acute distress. HEENT: normal. Neck: Supple, no JVD, carotid bruits, or masses. Cardiac: RRR, soft systolic murmurs, rubs, or gallops. No clubbing, cyanosis, edema.  Radials/DP/PT 2+ and equal bilaterally.  Respiratory:  Respirations regular and unlabored, clear to auscultation bilaterally. GI: Soft, nontender, nondistended, BS + x 4. MS: no deformity or atrophy. Skin: warm and dry, no rash. Neuro:  Strength and sensation are intact. Psych: Normal affect.  EKG Interpretation Date/Time:  Tuesday May 19 2023 11:15:43 EDT Ventricular Rate:  53 PR Interval:  212 QRS Duration:  94 QT Interval:  490 QTC Calculation: 459 R Axis:   -2  Text Interpretation: Sinus bradycardia with 1st degree A-V block When compared with ECG of 09-Jan-2016 11:13, Premature supraventricular complexes are no longer Present Confirmed by Joni Reining [phone]) on 05/19/2023 11:26:50 AM   Lab  Results   Component Value Date   WBC 7.8 10/18/2021   HGB 13.2 10/18/2021   HCT 40.4 10/18/2021   MCV 90 10/18/2021   PLT 328 10/18/2021   Lab Results  Component Value Date   CREATININE 0.94 09/12/2022   BUN 12 09/12/2022   NA 143 09/12/2022   K 4.0 09/12/2022   CL 104 09/12/2022   CO2 26 09/12/2022   Lab Results  Component Value Date   ALT 15 09/12/2022   AST 15 09/12/2022   ALKPHOS 76 09/12/2022   BILITOT 0.6 09/12/2022   Lab Results  Component Value Date   CHOL 130 09/12/2022   HDL 40 09/12/2022   LDLCALC 63 09/12/2022   LDLDIRECT 129.2 05/28/2007   TRIG 157 (H) 09/12/2022   CHOLHDL 3.3 09/12/2022    Lab Results  Component Value Date   HGBA1C 5.7 09/07/2017     Review of Prior Studies EKG Interpretation Date/Time:  Tuesday May 19 2023 11:15:43 EDT Ventricular Rate:  53 PR Interval:  212 QRS Duration:  94 QT Interval:  490 QTC Calculation: 459 R Axis:   -2  Text Interpretation: Sinus bradycardia with 1st degree A-V block When compared with ECG of 09-Jan-2016 11:13, Premature supraventricular complexes are no longer Present Confirmed by Joni Reining [phone]) on 05/19/2023 11:26:50 AM    Assessment & Plan   1.  Hypertension: Blood pressure is very well-controlled today.  Will give her refills on amlodipine 10 mg daily.  Labs will be drawn by primary care provider next week.  Would not make any changes in her medication regimen at this time.  2.  Hypercholesterolemia: Remains on atorvastatin 80 mg daily.  And Zetia goal of LDL less than 100  3.  Paroxysmal atrial fibrillation: Review of EKG today reveals normal sinus rhythm she will continue Xarelto 5 mg daily and metoprolol for heart rate control.       Signed, Bettey Mare. Liborio Nixon, ANP, AACC   05/19/2023 12:11 PM      Office [phone] Fax [phone]  Notice: This dictation was prepared with Dragon dictation along with smaller phrase technology. Any transcriptional errors that result from this  process are unintentional and may not be corrected upon review.

## 2023-05-19 ENCOUNTER — Encounter: Payer: Self-pay | Admitting: Adult Health

## 2023-05-19 ENCOUNTER — Other Ambulatory Visit: Payer: Self-pay | Admitting: Nurse Practitioner

## 2023-05-19 ENCOUNTER — Encounter: Payer: Self-pay | Admitting: Pharmacist

## 2023-05-19 ENCOUNTER — Ambulatory Visit: Payer: Medicare PPO | Attending: Adult Health | Admitting: Adult Health

## 2023-05-19 VITALS — BP 120/76 | HR 53 | Ht 63.0 in | Wt 122.6 lb

## 2023-05-19 DIAGNOSIS — I10 Essential (primary) hypertension: Secondary | ICD-10-CM

## 2023-05-19 DIAGNOSIS — I480 Paroxysmal atrial fibrillation: Secondary | ICD-10-CM | POA: Diagnosis not present

## 2023-05-19 DIAGNOSIS — I4821 Permanent atrial fibrillation: Secondary | ICD-10-CM

## 2023-05-19 MED ORDER — AMLODIPINE BESYLATE 10 MG PO TABS: 10.0000 mg | ORAL_TABLET | Freq: Every day | ORAL | 3 refills | Status: DC

## 2023-05-19 NOTE — Progress Notes (Signed)
Patient previously followed by [facility] pharmacist. Per clinical review, no pharmacist appointment needed at this time. Care guide directed to contact patient and cancel appointment and notify pharmacy team of any patient concerns.  

## 2023-05-19 NOTE — Patient Instructions (Signed)
Medication Instructions:  YOUR REFILL FOR AMLODIPINE HAS BEEN SENT TO YOUR PHARMACY *If you need a refill on your cardiac medications before your next appointment, please call your pharmacy*   Lab Work: NONE If you have labs (blood work) drawn today and your tests are completely normal, you will receive your results only by: MyChart Message (if you have MyChart) OR A paper copy in the mail If you have any lab test that is abnormal or we need to change your treatment, we will call you to review the results.   Testing/Procedures: NONE   Follow-Up: At [facility], you and your health needs are our priority.  As part of our continuing mission to provide you with exceptional heart care, we have created designated Provider Care Teams.  These Care Teams include your primary Cardiologist (physician) and Advanced Practice Providers (APPs -  Physician Assistants and Nurse Practitioners) who all work together to provide you with the care you need, when you need it.  We recommend signing up for the patient portal called "MyChart".  Sign up information is provided on this After Visit Summary.  MyChart is used to connect with patients for Virtual Visits (Telemedicine).  Patients are able to view lab/test results, encounter notes, upcoming appointments, etc.  Non-urgent messages can be sent to your provider as well.   To learn more about what you can do with MyChart, go to [email or web address].    Your next appointment:   6 month(s)  Provider:   Olga Millers, MD

## 2023-05-25 ENCOUNTER — Telehealth: Payer: Self-pay | Admitting: Nurse Practitioner

## 2023-05-25 ENCOUNTER — Telehealth: Payer: Self-pay

## 2023-05-25 ENCOUNTER — Ambulatory Visit: Payer: Medicare PPO | Admitting: Urology

## 2023-05-25 ENCOUNTER — Other Ambulatory Visit (HOSPITAL_COMMUNITY): Payer: Self-pay

## 2023-05-25 NOTE — Telephone Encounter (Signed)
[employer] [phone]  They are asking that you submit a formulary exception form for the Nexium. She said you can look online to find the form and print.

## 2023-05-25 NOTE — Telephone Encounter (Signed)
Pharmacy Patient Advocate Encounter   Received notification from Pt Calls Messages that prior authorization for Nexium 40mg  caps is required/requested.   Insurance verification completed.   The patient is insured through [facility] .   Per test claim: PA started via CoverMyMeds. KEY [id number] . Waiting for clinical questions to populate.

## 2023-05-25 NOTE — Telephone Encounter (Signed)
Looked up form online and it's a prior authorization form. Can you please submit a PA for Nexium?

## 2023-05-26 ENCOUNTER — Telehealth: Payer: Self-pay

## 2023-05-26 ENCOUNTER — Ambulatory Visit: Payer: Medicare PPO

## 2023-05-26 NOTE — Telephone Encounter (Signed)
Reached out to Ronald Pippins to clarify patient's OOP due at time of visit. $0 due at time of visit for Prolia.

## 2023-05-26 NOTE — Telephone Encounter (Signed)
Lvm for pt informing her that it is too soon for her Prolia injection at this time. Last Prolia was 01/2023, next inj due 07/2023. Pt is scheduled for [place] today at 1:20, which we need to reschedule for 07/2023.

## 2023-05-28 ENCOUNTER — Other Ambulatory Visit (HOSPITAL_COMMUNITY): Payer: Self-pay

## 2023-05-28 ENCOUNTER — Other Ambulatory Visit: Payer: Self-pay | Admitting: Nurse Practitioner

## 2023-05-28 DIAGNOSIS — Z1231 Encounter for screening mammogram for malignant neoplasm of breast: Secondary | ICD-10-CM

## 2023-05-28 NOTE — Telephone Encounter (Signed)
Pharmacy Patient Advocate Encounter  Received notification from [facility] that Prior Authorization for Nexium 40mg  has been APPROVED from 05/27/23 to 05/24/24. Ran test claim, Copay is $refill too soon.  PA #/Case ID/Reference #: [id number]  Approval letter indexed to Media tab

## 2023-05-29 ENCOUNTER — Other Ambulatory Visit (HOSPITAL_COMMUNITY): Payer: Self-pay

## 2023-05-29 ENCOUNTER — Ambulatory Visit: Payer: Medicare PPO | Admitting: Nurse Practitioner

## 2023-05-29 ENCOUNTER — Telehealth: Payer: Self-pay

## 2023-05-29 ENCOUNTER — Telehealth: Payer: Self-pay | Admitting: Nurse Practitioner

## 2023-05-29 ENCOUNTER — Encounter: Payer: Self-pay | Admitting: Nurse Practitioner

## 2023-05-29 VITALS — BP 122/70 | HR 55 | Temp 97.1°F | Resp 16 | Ht 63.0 in | Wt 122.7 lb

## 2023-05-29 DIAGNOSIS — E038 Other specified hypothyroidism: Secondary | ICD-10-CM | POA: Diagnosis not present

## 2023-05-29 DIAGNOSIS — H90A21 Sensorineural hearing loss, unilateral, right ear, with restricted hearing on the contralateral side: Secondary | ICD-10-CM

## 2023-05-29 DIAGNOSIS — F99 Mental disorder, not otherwise specified: Secondary | ICD-10-CM

## 2023-05-29 DIAGNOSIS — M810 Age-related osteoporosis without current pathological fracture: Secondary | ICD-10-CM | POA: Diagnosis not present

## 2023-05-29 DIAGNOSIS — F5105 Insomnia due to other mental disorder: Secondary | ICD-10-CM

## 2023-05-29 DIAGNOSIS — E7800 Pure hypercholesterolemia, unspecified: Secondary | ICD-10-CM | POA: Diagnosis not present

## 2023-05-29 LAB — COMPREHENSIVE METABOLIC PANEL
ALT: 16 U/L (ref 0–35)
AST: 18 U/L (ref 0–37)
Albumin: 4.6 g/dL (ref 3.5–5.2)
Alkaline Phosphatase: 60 U/L (ref 39–117)
BUN: 19 mg/dL (ref 6–23)
CO2: 28 mEq/L (ref 19–32)
Calcium: 10 mg/dL (ref 8.4–10.5)
Chloride: 104 mEq/L (ref 96–112)
Creatinine, Ser: 1.02 mg/dL (ref 0.40–1.20)
GFR: 52.9 mL/min — ABNORMAL LOW (ref >60.00)
Glucose, Bld: 95 mg/dL (ref 70–99)
Potassium: 4.3 mEq/L (ref 3.5–5.1)
Sodium: 141 mEq/L (ref 135–145)
Total Bilirubin: 0.6 mg/dL (ref 0.2–1.2)
Total Protein: 7 g/dL (ref 6.0–8.3)

## 2023-05-29 LAB — T4, FREE: Free T4: 0.75 ng/dL (ref 0.60–1.60)

## 2023-05-29 LAB — LIPID PANEL
Cholesterol: 133 mg/dL (ref 0–200)
HDL: 42.6 mg/dL (ref >39.00)
LDL Cholesterol: 59 mg/dL (ref 0–99)
NonHDL: 90.35
Total CHOL/HDL Ratio: 3
Triglycerides: 159 mg/dL — ABNORMAL HIGH (ref 0.0–149.0)
VLDL: 31.8 mg/dL (ref 0.0–40.0)

## 2023-05-29 LAB — TSH: TSH: 1.45 u[IU]/mL (ref 0.35–5.50)

## 2023-05-29 MED ORDER — TEMAZEPAM 7.5 MG PO CAPS: 7.5000 mg | ORAL_CAPSULE | Freq: Every evening | ORAL | 5 refills | Status: DC | PRN

## 2023-05-29 NOTE — Telephone Encounter (Addendum)
Ran test claim, came back refill too soon under [employer]. PA was done for [employer] ([employer] FEP). Unable to process to see if it goes through both insurances because it has already been filled. Called [facility], prescription was picked up on 05/21/23. Patient paid the same copay she had been paying.   Patient can contact [facility] and see if they can refund prescription and reprocess with both insurances.

## 2023-05-29 NOTE — Patient Outreach (Signed)
  Care Coordination   In Person Provider Office Visit Note   05/29/2023 Name: Patricia Peck MRN: [id number] DOB: 22-Nov-1945  Patricia Peck is a 77 y.o. year old female who sees Nche, Bonna Gains, NP for primary care. I engaged with [name] in the providers office today.  What matters to the patients health and wellness today?  N/A    Goals Addressed             This Visit's Progress    COMPLETED: Care Coordination Activities-No follow up required       Care Coordination Interventions: Advised patient to Annual Wellness exam. Discussed [facility] services and support. Assessed SDOH. Advised to discuss with primary care physician if services needed in the future.        SDOH assessments and interventions completed:  Yes  SDOH Interventions Today    Flowsheet Row Most Recent Value  SDOH Interventions   Food Insecurity Interventions Intervention Not Indicated  Transportation Interventions Intervention Not Indicated        Care Coordination Interventions:  Yes, provided   Follow up plan: No further intervention required.   Encounter Outcome:  Pt. Visit Completed   Bary Leriche, RN, MSN [facility] Care Management Care Management Coordinator Direct Line [phone]

## 2023-05-29 NOTE — Assessment & Plan Note (Addendum)
Repeat lipid panel Maintain zetia and atorvastatin dose while waiting for lab results

## 2023-05-29 NOTE — Patient Instructions (Signed)
Go to lab Continue Heart healthy diet and daily exercise. Maintain current medications. 

## 2023-05-29 NOTE — Assessment & Plan Note (Signed)
Denies any adverse effects with restoril 7.5mg  Reports need for sleep aid daily at hs. Med refill sent

## 2023-05-29 NOTE — Progress Notes (Signed)
Established Patient Visit  Patient: Patricia Peck   DOB: 1945-11-27   77 y.o. Female  MRN: [id number] Visit Date: 05/29/2023  Subjective:    Chief Complaint  Patient presents with   Hypertension   Hyperlipidemia    Fasting    Ear Fullness  There is pain in the right ear. This is a chronic problem. The current episode started more than 1 month ago. The problem occurs constantly. The problem has been unchanged. There has been no fever. Associated symptoms include hearing loss. Pertinent negatives include no abdominal pain, coughing, diarrhea, ear discharge, headaches, neck pain, rash, rhinorrhea, sore throat or vomiting. Associated symptoms comments: No tinnitus. She has tried nothing for the symptoms. Her past medical history is significant for hearing loss. There is no history of a chronic ear infection or a tympanostomy tube. left hearing loss   Osteoporosis Current use of prolia inj, calcium and vit. D Last prolia injection 6months ago, next inj 08/04/2023. Last dexa scan 2021 Repeat dexa scan  Hypercholesterolemia Repeat lipid panel Maintain zetia and atorvastatin dose while waiting for lab results  Hypothyroidism Repeat Tsh and T4 Maintain levothyroxine dose  Insomnia Denies any adverse effects with restoril 7.5mg  Reports need for sleep aid daily at hs. Med refill sent  Reviewed medical, surgical, and social history today  Medications: Outpatient Medications Prior to Visit  Medication Sig   Alpha-D-Galactosidase (BEANO PO) Take 1 tablet by mouth 2 (two) times daily.   amLODipine (NORVASC) 10 MG tablet Take 1 tablet (10 mg total) by mouth daily.   atorvastatin (LIPITOR) 80 MG tablet TAKE 1 TABLET(80 MG) BY MOUTH DAILY   Azelastine-Fluticasone 137-50 MCG/ACT SUSP Place 1 spray into both nostrils daily.   bumetanide (BUMEX) 1 MG tablet TAKE 1 TABLET(1 MG) BY MOUTH TWICE DAILY   Calcium Carbonate-Vitamin D 600-400 MG-UNIT tablet Take 1 tablet by mouth 2  (two) times daily.   Cholecalciferol (VITAMIN D-3) 5000 UNITS TABS Take 5,000 Units by mouth 3 (three) times a week. Tuesday Thursday Saturday   Cranberry 450 MG CAPS Take 2 capsules by mouth daily.   denosumab (PROLIA) 60 MG/ML SOLN injection Inject 60 mg into the skin once. Administer in upper arm, thigh, or abdomen   estradiol (ESTRACE) 0.1 MG/GM vaginal cream Place 1 Applicatorful vaginally 3 (three) times a week.   ezetimibe (ZETIA) 10 MG tablet Take 1 tablet (10 mg total) by mouth daily.   levothyroxine (SYNTHROID) 50 MCG tablet Take 1 tablet (50 mcg total) by mouth daily before breakfast.   metoprolol succinate (TOPROL-XL) 50 MG 24 hr tablet Take 1 tablet (50 mg total) by mouth daily. Maintain upcoming appt for additional refill   montelukast (SINGULAIR) 10 MG tablet TAKE 1 TABLET(10 MG) BY MOUTH AT BEDTIME   NEXIUM 40 MG capsule Take 1 capsule (40 mg total) by mouth daily at 12 noon.   nystatin (MYCOSTATIN) 100000 UNIT/ML suspension Use as directed 5 mLs (500,000 Units total) in the mouth or throat 4 (four) times daily.   OVER THE COUNTER MEDICATION Take 2 tablets by mouth daily. Bounty  Hair,Nail, and Skin daily gummies.   potassium chloride (KLOR-CON) 10 MEQ tablet Take 1 tablet (10 mEq total) by mouth 2 (two) times daily.   rOPINIRole (REQUIP) 0.25 MG tablet TAKE 1 TABLET(0.25 MG) BY MOUTH AT BEDTIME   trimethoprim (TRIMPEX) 100 MG tablet Take 1 tablet (100 mg total) by mouth daily.   vitamin  E 180 MG (400 UNITS) capsule Take 400 Units by mouth 3 (three) times a week. Tuesdays, Thursdays, and Saturdays   XARELTO 15 MG TABS tablet TAKE 1 TABLET(15 MG) BY MOUTH DAILY WITH SUPPER   [DISCONTINUED] temazepam (RESTORIL) 7.5 MG capsule Take 1 capsule (7.5 mg total) by mouth at bedtime as needed for sleep.   No facility-administered medications prior to visit.   Reviewed past medical and social history.   ROS per HPI above      Objective:  BP 122/70 (BP Location: Left Arm, Patient  Position: Sitting, Cuff Size: Normal)   Pulse (!) 55   Temp (!) 97.1 F (36.2 C) (Temporal)   Resp 16   Ht 5\' 3"  (1.6 m)   Wt 122 lb 11.2 oz (55.7 kg)   SpO2 98%   BMI 21.74 kg/m      Physical Exam HENT:     Right Ear: Tympanic membrane, ear canal and external ear normal. Decreased hearing noted.     Left Ear: Tympanic membrane, ear canal and external ear normal. Decreased hearing noted.     Ears:     Weber exam findings: Lateralizes left.    Right Rinne: AC > BC.    Left Rinne: AC > BC. Cardiovascular:     Rate and Rhythm: Normal rate and regular rhythm.     Pulses: Normal pulses.     Heart sounds: Normal heart sounds.  Pulmonary:     Effort: Pulmonary effort is normal.     Breath sounds: Normal breath sounds.  Musculoskeletal:     Right lower leg: No edema.     Left lower leg: No edema.  Neurological:     Mental Status: She is alert and oriented to person, place, and time.  Psychiatric:        Mood and Affect: Mood normal.        Behavior: Behavior normal.     No results found for any visits on 05/29/23.    Assessment & Plan:    Problem List Items Addressed This Visit       Endocrine   Hypothyroidism    Repeat Tsh and T4 Maintain levothyroxine dose      Relevant Orders   Comprehensive metabolic panel   T4, free   TSH     Musculoskeletal and Integument   Osteoporosis    Current use of prolia inj, calcium and vit. D Last prolia injection 6months ago, next inj 08/04/2023. Last dexa scan 2021 Repeat dexa scan      Relevant Orders   DG Bone Density     Other   Hypercholesterolemia - Primary    Repeat lipid panel Maintain zetia and atorvastatin dose while waiting for lab results      Relevant Orders   Lipid panel   Comprehensive metabolic panel   Insomnia    Denies any adverse effects with restoril 7.5mg  Reports need for sleep aid daily at hs. Med refill sent      Relevant Medications   temazepam (RESTORIL) 7.5 MG capsule   Other Visit  Diagnoses     Sensorineural hearing loss (SNHL) of right ear with restricted hearing of left ear       Relevant Orders   Ambulatory referral to Audiology      Return in about 6 months (around 11/29/2023) for Hypothyroidism, HTN, hyperlipidemia (fasting).     Alysia Penna, NP

## 2023-05-29 NOTE — Assessment & Plan Note (Signed)
Repeat Tsh and T4 Maintain levothyroxine dose

## 2023-05-29 NOTE — Patient Instructions (Signed)
Visit Information  Thank you for taking time to visit with me today. Please don't hesitate to contact me if I can be of assistance to you.   Following are the goals we discussed today:   Goals Addressed             This Visit's Progress    COMPLETED: Care Coordination Activities-No follow up required       Care Coordination Interventions: Advised patient to Annual Wellness exam. Discussed [facility] services and support. Assessed SDOH. Advised to discuss with primary care physician if services needed in the future.         If you are experiencing a Mental Health or Behavioral Health Crisis or need someone to talk to, please call the Suicide and Crisis Lifeline: 988   Patient verbalizes understanding of instructions and care plan provided today and agrees to view in MyChart. Active MyChart status and patient understanding of how to access instructions and care plan via MyChart confirmed with patient.     The patient has been provided with contact information for the care management team and has been advised to call with any health related questions or concerns.   Dionne J Leath, RN, MSN [facility] Care Management Care Management Coordinator Direct Line [phone]     

## 2023-05-29 NOTE — Telephone Encounter (Signed)
CALLED patient and gave her the name of the referral.

## 2023-05-29 NOTE — Assessment & Plan Note (Signed)
Current use of prolia inj, calcium and vit. D Last prolia injection 6months ago, next inj 08/04/2023. Last dexa scan 2021 Repeat dexa scan

## 2023-05-29 NOTE — Telephone Encounter (Signed)
CALLED patient and made her aware of the PA teams suggestion to contact [facility]

## 2023-05-29 NOTE — Telephone Encounter (Signed)
Pt stated she can't remember what the dr name you will send a referral. Please call the pt

## 2023-06-01 ENCOUNTER — Other Ambulatory Visit: Payer: Self-pay | Admitting: Nurse Practitioner

## 2023-06-01 DIAGNOSIS — E038 Other specified hypothyroidism: Secondary | ICD-10-CM

## 2023-06-01 MED ORDER — LEVOTHYROXINE SODIUM 50 MCG PO TABS: 50.0000 ug | ORAL_TABLET | Freq: Every day | ORAL | 1 refills | Status: DC

## 2023-06-24 DIAGNOSIS — H903 Sensorineural hearing loss, bilateral: Secondary | ICD-10-CM | POA: Diagnosis not present

## 2023-07-01 DIAGNOSIS — D225 Melanocytic nevi of trunk: Secondary | ICD-10-CM | POA: Diagnosis not present

## 2023-07-01 DIAGNOSIS — L820 Inflamed seborrheic keratosis: Secondary | ICD-10-CM | POA: Diagnosis not present

## 2023-07-01 DIAGNOSIS — Z1283 Encounter for screening for malignant neoplasm of skin: Secondary | ICD-10-CM | POA: Diagnosis not present

## 2023-07-02 DIAGNOSIS — N3021 Other chronic cystitis with hematuria: Secondary | ICD-10-CM | POA: Diagnosis not present

## 2023-07-02 DIAGNOSIS — N3946 Mixed incontinence: Secondary | ICD-10-CM | POA: Diagnosis not present

## 2023-07-02 DIAGNOSIS — R350 Frequency of micturition: Secondary | ICD-10-CM | POA: Diagnosis not present

## 2023-07-07 ENCOUNTER — Encounter: Payer: Self-pay | Admitting: Nurse Practitioner

## 2023-07-07 ENCOUNTER — Telehealth: Payer: Self-pay | Admitting: Nurse Practitioner

## 2023-07-07 ENCOUNTER — Ambulatory Visit (INDEPENDENT_AMBULATORY_CARE_PROVIDER_SITE_OTHER): Payer: Medicare PPO | Admitting: Nurse Practitioner

## 2023-07-07 VITALS — BP 130/82 | HR 62 | Temp 98.4°F | Ht 63.0 in | Wt 123.6 lb

## 2023-07-07 DIAGNOSIS — H6993 Unspecified Eustachian tube disorder, bilateral: Secondary | ICD-10-CM

## 2023-07-07 MED ORDER — METHYLPREDNISOLONE 4 MG PO TBPK: ORAL_TABLET | ORAL | 0 refills | Status: DC

## 2023-07-07 MED ORDER — CETIRIZINE HCL 5 MG PO TABS
5.0000 mg | ORAL_TABLET | Freq: Every day | ORAL | Status: DC
Start: 2023-07-07 — End: 2023-11-30

## 2023-07-07 NOTE — Patient Instructions (Signed)
Stop allegra Start zyrtec 5mg  1tab daily Hold azelastine/flonase while taking medrol dose pack Continue montelukast

## 2023-07-07 NOTE — Telephone Encounter (Signed)
Prolia VOB initiated via [email or web address]  Next Prolia inj DUE: 07/29/23

## 2023-07-07 NOTE — Telephone Encounter (Signed)
FYI:Pt recently had the covid vaccine [employer].

## 2023-07-07 NOTE — Assessment & Plan Note (Addendum)
Hearing loss and intermittent dizziness despite use of allegra, dymista nasal spray and montelukast. Hx of GERD: current use of nexium 40mg  every day.  Change allegra to zyrtec 5mg  Maintain montelukast Sent medrol dose pack Hold flonase till completion of medrol dose pack

## 2023-07-07 NOTE — Progress Notes (Signed)
Established Patient Visit  Patient: Patricia Peck   DOB: 1946/03/11   77 y.o. Female  MRN: [id number] Visit Date: 07/07/2023  Subjective:    Chief Complaint  Patient presents with   Medication Refill    Micah Flesher to Audiology and they recommended an allergy medication to help with her allergies   Eustachian tube disorder Hearing loss and intermittent dizziness despite use of allegra, dymista nasal spray and montelukast. Hx of GERD: current use of nexium 40mg  every day.  Change allegra to zyrtec 5mg  Maintain montelukast Sent medrol dose pack Hold flonase till completion of medrol dose pack  Reviewed medical, surgical, and social history today  Medications: Outpatient Medications Prior to Visit  Medication Sig   Alpha-D-Galactosidase (BEANO PO) Take 1 tablet by mouth 2 (two) times daily.   amLODipine (NORVASC) 10 MG tablet Take 1 tablet (10 mg total) by mouth daily.   atorvastatin (LIPITOR) 80 MG tablet TAKE 1 TABLET(80 MG) BY MOUTH DAILY   Azelastine-Fluticasone 137-50 MCG/ACT SUSP Place 1 spray into both nostrils daily.   bumetanide (BUMEX) 1 MG tablet TAKE 1 TABLET(1 MG) BY MOUTH TWICE DAILY   Calcium Carbonate-Vitamin D 600-400 MG-UNIT tablet Take 1 tablet by mouth 2 (two) times daily.   Cholecalciferol (VITAMIN D-3) 5000 UNITS TABS Take 5,000 Units by mouth 3 (three) times a week. Tuesday Thursday Saturday   Cranberry 450 MG CAPS Take 2 capsules by mouth daily.   denosumab (PROLIA) 60 MG/ML SOLN injection Inject 60 mg into the skin once. Administer in upper arm, thigh, or abdomen   estradiol (ESTRACE) 0.1 MG/GM vaginal cream Place 1 Applicatorful vaginally 3 (three) times a week.   ezetimibe (ZETIA) 10 MG tablet Take 1 tablet (10 mg total) by mouth daily.   levothyroxine (SYNTHROID) 50 MCG tablet Take 1 tablet (50 mcg total) by mouth daily before breakfast.   metoprolol succinate (TOPROL-XL) 50 MG 24 hr tablet Take 1 tablet (50 mg total) by mouth daily. Maintain  upcoming appt for additional refill   montelukast (SINGULAIR) 10 MG tablet TAKE 1 TABLET(10 MG) BY MOUTH AT BEDTIME   NEXIUM 40 MG capsule Take 1 capsule (40 mg total) by mouth daily at 12 noon.   nystatin (MYCOSTATIN) 100000 UNIT/ML suspension Use as directed 5 mLs (500,000 Units total) in the mouth or throat 4 (four) times daily.   OVER THE COUNTER MEDICATION Take 2 tablets by mouth daily. Bounty  Hair,Nail, and Skin daily gummies.   potassium chloride (KLOR-CON) 10 MEQ tablet Take 1 tablet (10 mEq total) by mouth 2 (two) times daily.   rOPINIRole (REQUIP) 0.25 MG tablet TAKE 1 TABLET(0.25 MG) BY MOUTH AT BEDTIME   temazepam (RESTORIL) 7.5 MG capsule Take 1 capsule (7.5 mg total) by mouth at bedtime as needed for sleep.   trimethoprim (TRIMPEX) 100 MG tablet Take 1 tablet (100 mg total) by mouth daily.   vitamin E 180 MG (400 UNITS) capsule Take 400 Units by mouth 3 (three) times a week. Tuesdays, Thursdays, and Saturdays   XARELTO 15 MG TABS tablet TAKE 1 TABLET(15 MG) BY MOUTH DAILY WITH SUPPER   No facility-administered medications prior to visit.   Reviewed past medical and social history.   ROS per HPI above      Objective:  BP 130/82 (BP Location: Left Arm, Patient Position: Sitting, Cuff Size: Normal)   Pulse 62   Temp 98.4 F (36.9 C) (Temporal)   Ht  5\' 3"  (1.6 m)   Wt 123 lb 9.6 oz (56.1 kg)   SpO2 98%   BMI 21.89 kg/m      Physical Exam Vitals reviewed.  HENT:     Right Ear: Ear canal and external ear normal. Decreased hearing noted. A middle ear effusion is present. There is no impacted cerumen. No mastoid tenderness. Tympanic membrane is not injected, scarred, perforated, erythematous or bulging.     Left Ear: Ear canal and external ear normal. Decreased hearing noted. A middle ear effusion is present. There is no impacted cerumen. No mastoid tenderness. Tympanic membrane is not injected, scarred, perforated, erythematous or bulging.     No results found for any  visits on 07/07/23.    Assessment & Plan:    Problem List Items Addressed This Visit     Eustachian tube disorder - Primary    Hearing loss and intermittent dizziness despite use of allegra, dymista nasal spray and montelukast. Hx of GERD: current use of nexium 40mg  every day.  Change allegra to zyrtec 5mg  Maintain montelukast Sent medrol dose pack Hold flonase till completion of medrol dose pack      Relevant Medications   methylPREDNISolone (MEDROL DOSEPAK) 4 MG TBPK tablet   cetirizine (ZYRTEC) 5 MG tablet   Return if symptoms worsen or fail to improve.     Alysia Penna, NP

## 2023-07-09 DIAGNOSIS — Z6821 Body mass index (BMI) 21.0-21.9, adult: Secondary | ICD-10-CM | POA: Diagnosis not present

## 2023-07-09 DIAGNOSIS — Z01419 Encounter for gynecological examination (general) (routine) without abnormal findings: Secondary | ICD-10-CM | POA: Diagnosis not present

## 2023-07-10 ENCOUNTER — Other Ambulatory Visit (HOSPITAL_COMMUNITY): Payer: Self-pay

## 2023-07-14 ENCOUNTER — Ambulatory Visit
Admission: RE | Admit: 2023-07-14 | Discharge: 2023-07-14 | Disposition: A | Discharge status: Home / Self Care | Payer: Medicare PPO | Source: Ambulatory Visit | Attending: Nurse Practitioner

## 2023-07-14 DIAGNOSIS — Z1231 Encounter for screening mammogram for malignant neoplasm of breast: Secondary | ICD-10-CM | POA: Diagnosis not present

## 2023-07-15 ENCOUNTER — Other Ambulatory Visit (HOSPITAL_COMMUNITY): Payer: Self-pay

## 2023-07-15 DIAGNOSIS — H903 Sensorineural hearing loss, bilateral: Secondary | ICD-10-CM | POA: Diagnosis not present

## 2023-07-15 NOTE — Telephone Encounter (Signed)
Pharmacy Patient Advocate Encounter  Received notification from [facility] that Prior Authorization for PROLIA has been APPROVED from 11/16/19 to 11/03/23   PA #/Case ID/Reference #: [id number]

## 2023-07-15 NOTE — Telephone Encounter (Signed)
Pharmacy Patient Advocate Encounter   Received notification from  [facility] Portal that prior authorization for PROLIA is required/requested.   Insurance verification completed.   The patient is insured through [employer] .   Per test claim: PA required; PA submitted to [employer] via CoverMyMeds Key/confirmation #/EOC [id number] Status is pending

## 2023-07-23 ENCOUNTER — Other Ambulatory Visit (HOSPITAL_COMMUNITY): Payer: Self-pay

## 2023-07-23 NOTE — Telephone Encounter (Signed)
Pharmacy Patient Advocate Encounter  Received notification from [facility] that Prior Authorization for Prolia 60MG /ML syringes has been APPROVED from 07/15/16 to 07/13/24   PA #/Case ID/Reference #: [id number]

## 2023-07-23 NOTE — Telephone Encounter (Signed)
Pt ready for scheduling for PROLIA on or after : 07/29/23  Out-of-pocket cost due at time of visit: $0  Primary: [employer] MEDICARE Prolia co-insurance: 0% Admin fee co-insurance: 0%  Secondary: [employer] FEP Prolia co-insurance:  Admin fee co-insurance:   Medical Benefit Details: Date Benefits were checked: 07/09/23 Deductible: NO/ Coinsurance: 0%/ Admin Fee: 0%  Prior Auth: APPROVED PA# [id number] (HU[employer]NA)/ [id number]  (BCBS[place]-FEP) Expiration Date: 12/04/19-11/03/23 Francine Graven) / 07/15/16-07/13/24  # of doses approved: 2  Pharmacy benefit: Copay $64 If patient wants fill through the pharmacy benefit please send prescription to: [employer], and include estimated need by date in rx notes. Pharmacy will ship medication directly to the office.  Patient NOT eligible for Prolia Copay Card. Copay Card can make patient's cost as little as $25. Link to apply: [email or web address]://[email or web address].[email or web address]/copay  ** This summary of benefits is an estimation of the patient's out-of-pocket cost. Exact cost may very based on individual plan coverage.

## 2023-07-29 NOTE — Telephone Encounter (Signed)
Patient has an appointment already scheduled for 08/04/23 at 120pm

## 2023-08-04 ENCOUNTER — Ambulatory Visit: Payer: Medicare PPO

## 2023-08-04 DIAGNOSIS — M810 Age-related osteoporosis without current pathological fracture: Secondary | ICD-10-CM

## 2023-08-04 MED ORDER — DENOSUMAB 60 MG/ML ~~LOC~~ SOSY
60.0000 mg | PREFILLED_SYRINGE | Freq: Once | SUBCUTANEOUS | Status: AC
Start: 2023-08-04 — End: 2023-08-04
  Administered 2023-08-04: 60 mg via SUBCUTANEOUS

## 2023-08-04 NOTE — Progress Notes (Signed)
Per orders of Charttote Nche pt is here to receive Prolia Injection, pt received Prolia injection in Right arm SubQ. Pt tolerated Prolia Injection well. Pt will return in 6 months for next Prolia Injection.     Drop these charges: [id number]   [id number] LOT: [id number] NDC: 45409-811-91

## 2023-08-12 ENCOUNTER — Other Ambulatory Visit: Payer: Self-pay | Admitting: Nurse Practitioner

## 2023-08-12 ENCOUNTER — Other Ambulatory Visit: Payer: Self-pay | Admitting: Cardiology

## 2023-08-12 DIAGNOSIS — I480 Paroxysmal atrial fibrillation: Secondary | ICD-10-CM

## 2023-08-12 DIAGNOSIS — E7800 Pure hypercholesterolemia, unspecified: Secondary | ICD-10-CM

## 2023-08-12 DIAGNOSIS — I4821 Permanent atrial fibrillation: Secondary | ICD-10-CM

## 2023-08-12 DIAGNOSIS — I10 Essential (primary) hypertension: Secondary | ICD-10-CM

## 2023-08-12 MED ORDER — EZETIMIBE 10 MG PO TABS
10.0000 mg | ORAL_TABLET | Freq: Every day | ORAL | 2 refills | Status: DC
Start: 2023-08-12 — End: 2024-08-01

## 2023-08-12 NOTE — Telephone Encounter (Signed)
Chart supports rx. Last OV: 07/07/2023 Next OV: 11/30/2023

## 2023-08-31 ENCOUNTER — Other Ambulatory Visit: Payer: Self-pay | Admitting: Nurse Practitioner

## 2023-08-31 DIAGNOSIS — K219 Gastro-esophageal reflux disease without esophagitis: Secondary | ICD-10-CM

## 2023-10-12 ENCOUNTER — Other Ambulatory Visit: Payer: Self-pay | Admitting: Cardiology

## 2023-10-12 DIAGNOSIS — I480 Paroxysmal atrial fibrillation: Secondary | ICD-10-CM

## 2023-10-12 NOTE — Telephone Encounter (Signed)
Prescription refill request for Xarelto received.  Indication: PAF Last office visit: 05/19/23  [name] NP Weight: 55.6kg Age:77 Scr: 1.02 on 05/29/23  Epic CrCl: 39.90  Based on above findings Xarelto 15mg  daily is the appropriate dose.  Refill approved.

## 2023-10-19 ENCOUNTER — Ambulatory Visit: Payer: Medicare PPO

## 2023-10-19 DIAGNOSIS — Z0000 Encounter for general adult medical examination without abnormal findings: Secondary | ICD-10-CM

## 2023-10-19 NOTE — Progress Notes (Signed)
Subjective:   Patricia Peck is a 77 y.o. female who presents for Medicare Annual (Subsequent) preventive examination.  Visit Complete: Virtual I connected with  Patricia Peck on 10/19/23 by a audio enabled telemedicine application and verified that I am speaking with the correct person using two identifiers.  Patient Location: Home  Provider Location: Office/Clinic  I discussed the limitations of evaluation and management by telemedicine. The patient expressed understanding and agreed to proceed.  Vital Signs: Because this visit was a virtual/telehealth visit, some criteria may be missing or patient reported. Any vitals not documented were not able to be obtained and vitals that have been documented are patient reported.  Patient Medicare AWV questionnaire was completed by the patient on 10/18/2023; I have confirmed that all information answered by patient is correct and no changes since this date.  Cardiac Risk Factors include: advanced age (>40men, >31 women);hypertension     Objective:    Today's Vitals   There is no height or weight on file to calculate BMI.     10/19/2023    3:50 PM 10/21/2022    3:49 PM 06/24/2022   11:30 AM 10/09/2021   11:32 AM 03/06/2021    5:03 PM 02/12/2021    8:24 AM 02/05/2021    2:42 PM  Advanced Directives  Does Patient Have a Medical Advance Directive? Yes Yes Yes Yes Yes Yes Yes  Type of Estate agent of [place];Living will Healthcare Power of [place];Living will Healthcare Power of [place];Living will Healthcare Power of [place];Living will Healthcare Power of [place];Living will Healthcare Power of [place];Living will Healthcare Power of [place];Living will  Copy of Healthcare Power of Attorney in Chart? No - copy requested No - copy requested  No - copy requested No - copy requested  No - copy requested    Current Medications (verified) Outpatient Encounter Medications as of 10/19/2023  Medication Sig    Alpha-D-Galactosidase (BEANO PO) Take 1 tablet by mouth 2 (two) times daily.   amLODipine (NORVASC) 10 MG tablet Take 1 tablet (10 mg total) by mouth daily.   atorvastatin (LIPITOR) 80 MG tablet TAKE 1 TABLET(80 MG) BY MOUTH DAILY   Azelastine-Fluticasone 137-50 MCG/ACT SUSP Place 1 spray into both nostrils daily.   bumetanide (BUMEX) 1 MG tablet TAKE 1 TABLET(1 MG) BY MOUTH TWICE DAILY   Calcium Carbonate-Vitamin D 600-400 MG-UNIT tablet Take 1 tablet by mouth 2 (two) times daily.   cetirizine (ZYRTEC) 5 MG tablet Take 1 tablet (5 mg total) by mouth at bedtime.   Cholecalciferol (VITAMIN D-3) 5000 UNITS TABS Take 5,000 Units by mouth 3 (three) times a week. Tuesday Thursday Saturday   Cranberry 450 MG CAPS Take 2 capsules by mouth daily.   denosumab (PROLIA) 60 MG/ML SOLN injection Inject 60 mg into the skin once. Administer in upper arm, thigh, or abdomen   esomeprazole (NEXIUM) 40 MG capsule TAKE 1 CAPSULE BY MOUTH EVERY DAY AT NOON   estradiol (ESTRACE) 0.1 MG/GM vaginal cream Place 1 Applicatorful vaginally 3 (three) times a week.   ezetimibe (ZETIA) 10 MG tablet Take 1 tablet (10 mg total) by mouth daily.   levothyroxine (SYNTHROID) 50 MCG tablet Take 1 tablet (50 mcg total) by mouth daily before breakfast.   metoprolol succinate (TOPROL-XL) 50 MG 24 hr tablet Take 1 tablet (50 mg total) by mouth daily.   montelukast (SINGULAIR) 10 MG tablet TAKE 1 TABLET(10 MG) BY MOUTH AT BEDTIME   OVER THE COUNTER MEDICATION Take 2 tablets by mouth  daily. Bounty  Hair,Nail, and Skin daily gummies.   potassium chloride (KLOR-CON) 10 MEQ tablet Take 1 tablet (10 mEq total) by mouth 2 (two) times daily.   rOPINIRole (REQUIP) 0.25 MG tablet TAKE 1 TABLET(0.25 MG) BY MOUTH AT BEDTIME   trimethoprim (TRIMPEX) 100 MG tablet Take 1 tablet (100 mg total) by mouth daily.   vitamin E 180 MG (400 UNITS) capsule Take 400 Units by mouth 3 (three) times a week. Tuesdays, Thursdays, and Saturdays   XARELTO 15 MG TABS  tablet TAKE 1 TABLET(15 MG) BY MOUTH DAILY WITH SUPPER   methylPREDNISolone (MEDROL DOSEPAK) 4 MG TBPK tablet Take as directed on package (Patient not taking: Reported on 10/19/2023)   nystatin (MYCOSTATIN) 100000 UNIT/ML suspension Use as directed 5 mLs (500,000 Units total) in the mouth or throat 4 (four) times daily. (Patient not taking: Reported on 10/19/2023)   temazepam (RESTORIL) 7.5 MG capsule Take 1 capsule (7.5 mg total) by mouth at bedtime as needed for sleep. (Patient not taking: Reported on 10/19/2023)   No facility-administered encounter medications on file as of 10/19/2023.    Allergies (verified) Patient has no known allergies.   History: Past Medical History:  Diagnosis Date   Allergy    Arthritis    hands   Asthma    uses an inhaler   Atrial fibrillation (HCC)    Atrial flutter (HCC)    Depression    Dysrhythmia    a-fib, on Xarelto   GERD (gastroesophageal reflux disease)    Hyperlipidemia    Hypertension    Hypothyroidism    Osteoporosis    Stroke (HCC) 2017   TIA   TIA (transient ischemic attack)    Past Surgical History:  Procedure Laterality Date   ABDOMINAL HYSTERECTOMY     COLONOSCOPY     MYRINGOTOMY WITH TUBE PLACEMENT Right 01/14/2016   Procedure: RIGHT MYRINGOTOMY WITH T-TUBE PLACEMENT;  Surgeon: Newman Pies, MD;  Location: [facility];  Service: ENT;  Laterality: Right;   PARTIAL HYSTERECTOMY  1989   ROBOTIC ASSISTED BILATERAL SALPINGO OOPHERECTOMY Bilateral 02/14/2021   Procedure: XI ROBOTIC ASSISTED BILATERAL SALPINGO OOPHORECTOMY;  Surgeon: Adolphus Birchwood, MD;  Location: [facility];  Service: Gynecology;  Laterality: Bilateral;   TEE WITHOUT CARDIOVERSION N/A 12/22/2012   Procedure: TRANSESOPHAGEAL ECHOCARDIOGRAM (TEE);  Surgeon: [name], MD;  Location: [facility];  Service: Cardiovascular;  Laterality: N/A;   Family History  Problem Relation Age of Onset   Breast cancer Mother    Drug abuse Son    Depression Son    Stroke  Paternal Aunt    Heart disease Paternal Grandfather    Diabetes Maternal Grandfather    Colon cancer Neg Hx    Ovarian cancer Neg Hx    Endometrial cancer Neg Hx    Pancreatic cancer Neg Hx    Prostate cancer Neg Hx    Social History   Socioeconomic History   Marital status: Widowed    Spouse name: Not on file   Number of children: 2   Years of education: Not on file   Highest education level: Associate degree: occupational, Scientist, product/process development, or vocational program  Occupational History    Employer: RETIRED  Tobacco Use   Smoking status: Never    Passive exposure: Never   Smokeless tobacco: Never  Vaping Use   Vaping status: Never Used  Substance and Sexual Activity   Alcohol use: Not Currently    Comment: Rare   Drug use: No   Sexual activity:  Not Currently  Other Topics Concern   Not on file  Social History Narrative   Does have [name].   Does desire life support if not futile.   Not sure about feeding tubes.   Social Drivers of Corporate investment banker Strain: Low Risk  (10/18/2023)   Overall Financial Resource Strain (CARDIA)    Difficulty of Paying Living Expenses: Not hard at all  Food Insecurity: No Food Insecurity (10/18/2023)   Hunger Vital Sign    Worried About Running Out of Food in the Last Year: Never true    Ran Out of Food in the Last Year: Never true  Transportation Needs: No Transportation Needs (10/18/2023)   PRAPARE - Administrator, Civil Service (Medical): No    Lack of Transportation (Non-Medical): No  Physical Activity: Insufficiently Active (10/18/2023)   Exercise Vital Sign    Days of Exercise per Week: 4 days    Minutes of Exercise per Session: 30 min  Stress: Stress Concern Present (10/18/2023)   [employer] of Occupational Health - Occupational Stress Questionnaire    Feeling of Stress : To some extent  Social Connections: Moderately Integrated (10/18/2023)   Social Connection and Isolation Panel  [NHANES]    Frequency of Communication with Friends and Family: More than three times a week    Frequency of Social Gatherings with Friends and Family: Once a week    Attends Religious Services: More than 4 times per year    Active Member of [employer] or Organizations: Yes    Attends Banker Meetings: More than 4 times per year    Marital Status: Widowed    Tobacco Counseling Counseling given: Not Answered   Clinical Intake:  Pre-visit preparation completed: Yes  Pain : No/denies pain     Nutritional Risks: None Diabetes: No  How often do you need to have someone help you when you read instructions, pamphlets, or other written materials from your doctor or pharmacy?: 1 - Never  Interpreter Needed?: No  Information entered by :: NAllen LPN   Activities of Daily Living    10/18/2023    5:26 PM 10/21/2022    3:50 PM  In your present state of health, do you have any difficulty performing the following activities:  Hearing? 0 0  Vision? 0 0  Difficulty concentrating or making decisions? 0 0  Walking or climbing stairs? 0 0  Dressing or bathing? 0 0  Doing errands, shopping? 0 0  Preparing Food and eating ? N N  Using the Toilet? N N  In the past six months, have you accidently leaked urine? N N  Do you have problems with loss of bowel control? N N  Managing your Medications? N N  Managing your Finances? N N  Housekeeping or managing your Housekeeping? N N    Patient Care Team: Nche, Bonna Gains, NP as PCP - General (Internal Medicine) Jens Som Madolyn Frieze, MD as PCP - Cardiology (Cardiology) Jens Som Madolyn Frieze, MD as Consulting Physician (Cardiology) Newman Pies, MD as Consulting Physician (Otolaryngology) Sidney Ace, MD as Referring Physician (Allergy) Gaspar Cola, [facility] (Inactive) as Pharmacist (Pharmacist)  Indicate any recent Medical Services you may have received from other than [facility] providers in the past year (date may be approximate).      Assessment:   This is a routine wellness examination for Addilee.  Hearing/Vision screen Hearing Screening - Comments:: Denies hearing issues Vision Screening - Comments:: Regular eye exams,  Dr. Rudy Jew   Goals Addressed             This Visit's Progress    Patient Stated       10/19/2023, wants to exercise more often       Depression Screen    10/19/2023    3:51 PM 05/29/2023    9:55 AM 10/21/2022    3:50 PM 06/24/2022   11:32 AM 10/09/2021   11:32 AM 10/09/2021   11:30 AM 07/16/2021   10:21 AM  PHQ 2/9 Scores  PHQ - 2 Score 0 0 0 1 0 0 0    Fall Risk    10/18/2023    5:26 PM 05/29/2023    9:54 AM 10/21/2022    3:49 PM 05/29/2022    2:27 PM 10/09/2021   11:32 AM  Fall Risk   Falls in the past year? 0 0 0 1 0  Number falls in past yr: 0 0 0 0 0  Injury with Fall? 0 0 0 1 0  Risk for fall due to : Medication side effect No Fall Risks Medication side effect    Follow up Falls prevention discussed;Falls evaluation completed Falls evaluation completed Falls prevention discussed;Education provided;Falls evaluation completed  Falls evaluation completed    MEDICARE RISK AT HOME: Medicare Risk at Home Any stairs in or around the home?: Yes If so, are there any without handrails?: No Home free of loose throw rugs in walkways, pet beds, electrical cords, etc?: Yes Adequate lighting in your home to reduce risk of falls?: Yes Life alert?: No Use of a cane, walker or w/c?: No Grab bars in the bathroom?: No Shower chair or bench in shower?: Yes Elevated toilet seat or a handicapped toilet?: No  TIMED UP AND GO:  Was the test performed?  No    Cognitive Function:    04/21/2018    2:33 PM 04/21/2017    2:20 PM  MMSE - Mini Mental State Exam  Orientation to time 5 5  Orientation to Place 5 5  Registration 3 3  Attention/ Calculation 5 0  Recall 3 1  Recall-comments  pt was unable to recall 2 of 3 words  Language- name 2 objects 2 0  Language- repeat 1 1  Language-  follow 3 step command 3 3  Language- read & follow direction 1 0  Write a sentence 1 0  Copy design 1 0  Total score 30 18        10/19/2023    3:51 PM 10/21/2022    3:51 PM  6CIT Screen  What Year? 0 points 0 points  What month? 0 points 0 points  What time? 0 points 0 points  Count back from 20 0 points 0 points  Months in reverse 0 points 0 points  Repeat phrase 2 points 0 points  Total Score 2 points 0 points    Immunizations Immunization History  Administered Date(s) Administered   Fluad Quad(high Dose 65+) 06/30/2019, 07/19/2022, 06/27/2023   Influenza Split 09/04/2011, 08/11/2012, 08/24/2013, 07/20/2015, 07/29/2016, 08/04/2017, 07/10/2018   Influenza Whole 08/07/2009   Influenza, High Dose Seasonal PF 08/24/2013, 09/26/2013, 08/28/2014, 07/20/2015, 07/29/2016, 11/27/2016, 08/04/2017, 12/10/2017, 11/18/2018, 06/30/2019, 11/15/2019, 11/13/2020, 11/14/2021   Influenza,inj,Quad PF,6+ Mos 08/24/2013, 07/20/2015, 07/29/2016, 08/04/2017   Influenza,trivalent, recombinat, inj, PF 09/04/2011, 08/11/2012, 08/04/2017   Influenza-Unspecified 09/04/2011, 08/11/2012, 08/24/2013, 07/20/2015, 07/29/2016, 08/04/2017, 07/10/2018, 07/14/2020   PFIZER(Purple Top)SARS-COV-2 Vaccination 11/23/2019, 01/04/2020, 02/02/2020, 04/05/2020, 08/30/2020   Pneumococcal Conjugate-13 04/12/2014   Pneumococcal Polysaccharide-23 02/05/2006, 02/05/2006, 04/08/2012, 04/08/2012,  04/22/2016, 11/27/2016, 12/10/2017, 11/18/2018, 11/15/2019, 04/05/2020, 11/13/2020, 11/14/2021   Td 01/29/2005, 04/17/2015   Zoster Recombinant(Shingrix) 02/09/2018, 05/11/2020   Zoster, Live 04/16/2007   Zoster, Unspecified 02/09/2018    TDAP status: Up to date  Flu Vaccine status: Up to date  Pneumococcal vaccine status: Up to date  Covid-19 vaccine status: Completed vaccines  Qualifies for Shingles Vaccine? Yes   Zostavax completed Yes   Shingrix Completed?: Yes  Screening Tests Health Maintenance  Topic Date Due    MAMMOGRAM  07/13/2024   Medicare Annual Wellness (AWV)  10/18/2024   DTaP/Tdap/Td (3 - Tdap) 04/16/2025   Pneumonia Vaccine 28+ Years old  Completed   INFLUENZA VACCINE  Completed   DEXA SCAN  Completed   COVID-19 Vaccine  Completed   Hepatitis C Screening  Completed   Zoster Vaccines- Shingrix  Completed   HPV VACCINES  Aged Out   Colonoscopy  Discontinued    Health Maintenance  There are no preventive care reminders to display for this patient.   Colorectal cancer screening: No longer required.   Mammogram status: Completed 07/14/2023. Repeat every year  Bone Density status: Completed 04/04/2020.   Lung Cancer Screening: (Low Dose CT Chest recommended if Age 77-80 years, 20 pack-year currently smoking OR have quit w/in 15years.) does not qualify.   Lung Cancer Screening Referral: no  Additional Screening:  Hepatitis C Screening: does qualify; Completed 07/20/2015  Vision Screening: Recommended annual ophthalmology exams for early detection of glaucoma and other disorders of the eye. Is the patient up to date with their annual eye exam?  Yes  Who is the provider or what is the name of the office in which the patient attends annual eye exams? Dr. Rudy Jew If pt is not established with a provider, would they like to be referred to a provider to establish care? No .   Dental Screening: Recommended annual dental exams for proper oral hygiene  Diabetic Foot Exam: n/a  Community Resource Referral / Chronic Care Management: CRR required this visit?  No   CCM required this visit?  No     Plan:     I have personally reviewed and noted the following in the patient's chart:   Medical and social history Use of alcohol, tobacco or illicit drugs  Current medications and supplements including opioid prescriptions. Patient is not currently taking opioid prescriptions. Functional ability and status Nutritional status Physical activity Advanced directives List of other  physicians Hospitalizations, surgeries, and ER visits in previous 12 months Vitals Screenings to include cognitive, depression, and falls Referrals and appointments  In addition, I have reviewed and discussed with patient certain preventive protocols, quality metrics, and best practice recommendations. A written personalized care plan for preventive services as well as general preventive health recommendations were provided to patient.     Barb Merino, LPN   [id number]   After Visit Summary: (MyChart) Due to this being a telephonic visit, the after visit summary with patients personalized plan was offered to patient via MyChart   Nurse Notes: none

## 2023-10-19 NOTE — Patient Instructions (Signed)
Patricia Peck , Thank you for taking time to come for your Medicare Wellness Visit. I appreciate your ongoing commitment to your health goals. Please review the following plan we discussed and let me know if I can assist you in the future.   Referrals/Orders/Follow-Ups/Clinician Recommendations: none  This is a list of the screening recommended for you and due dates:  Health Maintenance  Topic Date Due   Mammogram  07/13/2024   Medicare Annual Wellness Visit  10/18/2024   DTaP/Tdap/Td vaccine (3 - Tdap) 04/16/2025   Pneumonia Vaccine  Completed   Flu Shot  Completed   DEXA scan (bone density measurement)  Completed   COVID-19 Vaccine  Completed   Hepatitis C Screening  Completed   Zoster (Shingles) Vaccine  Completed   HPV Vaccine  Aged Out   Colon Cancer Screening  Discontinued    Advanced directives: (Copy Requested) Please bring a copy of your health care power of attorney and living will to the office to be added to your chart at your convenience.  Next Medicare Annual Wellness Visit scheduled for next year: Yes  Insert Preventive Care attachment Insert FALL PREVENTION attachment if needed

## 2023-11-09 NOTE — Progress Notes (Signed)
 HPI: FU atrial fibrillation and TIA. Brain MRI in January 2014 showed no acute infarct, mild small vessel disease. TEE 2/14 showed normal LV function, mild mitral valve prolapse with mild mitral regurgitation, trace aortic insufficiency. There was no PFO. Monitor in March of 2014 showed paroxysmal atrial fibrillation/flutter. She was therefore started on anticoagulation. Carotid Dopplers 10/20 showed 1-39% bilateral stenosis; left subclavian stenosis noted.  Cardiac CTA October 2020 showed calcium  score 7 but no obstructive coronary disease. Monitor June 2021 showed sinus rhythm with PACs and PAF.  Echocardiogram February 2022 showed normal LV function, mild aortic insufficiency. Since I last saw the patient denies any dyspnea on exertion, orthopnea, PND, pedal edema, palpitations, syncope or chest pain.   Current Outpatient Medications  Medication Sig Dispense Refill   Alpha-D-Galactosidase (BEANO PO) Take 1 tablet by mouth 2 (two) times daily.     amLODipine  (NORVASC ) 10 MG tablet Take 1 tablet (10 mg total) by mouth daily. 90 tablet 3   atorvastatin  (LIPITOR) 80 MG tablet TAKE 1 TABLET(80 MG) BY MOUTH DAILY 90 tablet 3   Azelastine-Fluticasone  137-50 MCG/ACT SUSP Place 1 spray into both nostrils daily.     bumetanide  (BUMEX ) 1 MG tablet TAKE 1 TABLET(1 MG) BY MOUTH TWICE DAILY 180 tablet 1   Calcium  Carbonate-Vitamin D  600-400 MG-UNIT tablet Take 1 tablet by mouth 2 (two) times daily.     cetirizine  (ZYRTEC ) 5 MG tablet Take 1 tablet (5 mg total) by mouth at bedtime.     Cholecalciferol (VITAMIN D -3) 5000 UNITS TABS Take 5,000 Units by mouth 3 (three) times a week. Tuesday Thursday Saturday     Cranberry 450 MG CAPS Take 2 capsules by mouth daily.     denosumab  (PROLIA ) 60 MG/ML SOLN injection Inject 60 mg into the skin once. Administer in upper arm, thigh, or abdomen 180 mL 0   esomeprazole  (NEXIUM ) 40 MG capsule TAKE 1 CAPSULE BY MOUTH EVERY DAY AT NOON 90 capsule 1   estradiol (ESTRACE)  0.1 MG/GM vaginal cream Place 1 Applicatorful vaginally 3 (three) times a week.     ezetimibe  (ZETIA ) 10 MG tablet Take 1 tablet (10 mg total) by mouth daily. 90 tablet 2   levothyroxine  (SYNTHROID ) 50 MCG tablet Take 1 tablet (50 mcg total) by mouth daily before breakfast. 90 tablet 1   methylPREDNISolone  (MEDROL  DOSEPAK) 4 MG TBPK tablet Take as directed on package 21 tablet 0   metoprolol  succinate (TOPROL -XL) 50 MG 24 hr tablet Take 1 tablet (50 mg total) by mouth daily. 90 tablet 2   montelukast  (SINGULAIR ) 10 MG tablet TAKE 1 TABLET(10 MG) BY MOUTH AT BEDTIME 90 tablet 1   nystatin  (MYCOSTATIN ) 100000 UNIT/ML suspension Use as directed 5 mLs (500,000 Units total) in the mouth or throat 4 (four) times daily. 120 mL 1   OVER THE COUNTER MEDICATION Take 2 tablets by mouth daily. Bounty  Hair,Nail, and Skin daily gummies.     potassium chloride  (KLOR-CON ) 10 MEQ tablet Take 1 tablet (10 mEq total) by mouth 2 (two) times daily. 180 tablet 3   rOPINIRole  (REQUIP ) 0.25 MG tablet TAKE 1 TABLET(0.25 MG) BY MOUTH AT BEDTIME 90 tablet 3   temazepam  (RESTORIL ) 7.5 MG capsule Take 1 capsule (7.5 mg total) by mouth at bedtime as needed for sleep. 30 capsule 5   trimethoprim  (TRIMPEX ) 100 MG tablet Take 1 tablet (100 mg total) by mouth daily. 30 tablet 11   vitamin E 180 MG (400 UNITS) capsule Take 400 Units by  mouth 3 (three) times a week. Tuesdays, Thursdays, and Saturdays     XARELTO  15 MG TABS tablet TAKE 1 TABLET(15 MG) BY MOUTH DAILY WITH SUPPER 90 tablet 1   No current facility-administered medications for this visit.     Past Medical History:  Diagnosis Date   Allergy    Arthritis    hands   Asthma    uses an inhaler   Atrial fibrillation (HCC)    Atrial flutter (HCC)    Depression    Dysrhythmia    a-fib, on Xarelto    GERD (gastroesophageal reflux disease)    Hyperlipidemia    Hypertension    Hypothyroidism    Osteoporosis    Stroke (HCC) 2017   TIA   TIA (transient ischemic  attack)     Past Surgical History:  Procedure Laterality Date   ABDOMINAL HYSTERECTOMY     COLONOSCOPY     MYRINGOTOMY WITH TUBE PLACEMENT Right 01/14/2016   Procedure: RIGHT MYRINGOTOMY WITH T-TUBE PLACEMENT;  Surgeon: Daniel Moccasin, MD;  Location: [facility];  Service: ENT;  Laterality: Right;   PARTIAL HYSTERECTOMY  1989   ROBOTIC ASSISTED BILATERAL SALPINGO OOPHERECTOMY Bilateral 02/14/2021   Procedure: XI ROBOTIC ASSISTED BILATERAL SALPINGO OOPHORECTOMY;  Surgeon: Eloy Herring, MD;  Location: [facility];  Service: Gynecology;  Laterality: Bilateral;   TEE WITHOUT CARDIOVERSION N/A 12/22/2012   Procedure: TRANSESOPHAGEAL ECHOCARDIOGRAM (TEE);  Surgeon: Redell GORMAN Shallow, MD;  [facility];  Service: Cardiovascular;  Laterality: N/A;    Social History   Socioeconomic History   Marital status: Widowed    Spouse name: Not on file   Number of children: 2   Years of education: Not on file   Highest education level: Associate degree: occupational, scientist, product/process development, or vocational program  Occupational History    Employer: RETIRED  Tobacco Use   Smoking status: Never    Passive exposure: Never   Smokeless tobacco: Never  Vaping Use   Vaping status: Never Used  Substance and Sexual Activity   Alcohol use: Not Currently    Comment: Rare   Drug use: No   Sexual activity: Not Currently  Other Topics Concern   Not on file  Social History Narrative   Does have HPOA- sons Deward and Larnell.   Does desire life support if not futile.   Not sure about feeding tubes.   Social Drivers of Corporate Investment Banker Strain: Low Risk  (10/18/2023)   Overall Financial Resource Strain (CARDIA)    Difficulty of Paying Living Expenses: Not hard at all  Food Insecurity: No Food Insecurity (10/18/2023)   Hunger Vital Sign    Worried About Running Out of Food in the Last Year: Never true    Ran Out of Food in the Last Year: Never true  Transportation Needs: No Transportation Needs  (10/18/2023)   PRAPARE - Administrator, Civil Service (Medical): No    Lack of Transportation (Non-Medical): No  Physical Activity: Insufficiently Active (10/18/2023)   Exercise Vital Sign    Days of Exercise per Week: 4 days    Minutes of Exercise per Session: 30 min  Stress: Stress Concern Present (10/18/2023)   [employer] of Occupational Health - Occupational Stress Questionnaire    Feeling of Stress : To some extent  Social Connections: Moderately Integrated (10/18/2023)   Social Connection and Isolation Panel [NHANES]    Frequency of Communication with Friends and Family: More than three times a week    Frequency  of Social Gatherings with Friends and Family: Once a week    Attends Religious Services: More than 4 times per year    Active Member of [employer] or Organizations: Yes    Attends Banker Meetings: More than 4 times per year    Marital Status: Widowed  Intimate Partner Violence: Not At Risk (10/19/2023)   Humiliation, Afraid, Rape, and Kick questionnaire    Fear of Current or Ex-Partner: No    Emotionally Abused: No    Physically Abused: No    Sexually Abused: No    Family History  Problem Relation Age of Onset   Breast cancer Mother    Drug abuse Son    Depression Son    Stroke Paternal Aunt    Heart disease Paternal Grandfather    Diabetes Maternal Grandfather    Colon cancer Neg Hx    Ovarian cancer Neg Hx    Endometrial cancer Neg Hx    Pancreatic cancer Neg Hx    Prostate cancer Neg Hx     ROS: no fevers or chills, productive cough, hemoptysis, dysphasia, odynophagia, melena, hematochezia, dysuria, hematuria, rash, seizure activity, orthopnea, PND, pedal edema, claudication. Remaining systems are negative.  Physical Exam: Well-developed well-nourished in no acute distress.  Skin is warm and dry.  HEENT is normal.  Neck is supple.  Chest is clear to auscultation with normal expansion.  Cardiovascular exam is regular rate  and rhythm.  Abdominal exam nontender or distended. No masses palpated. Extremities show no edema. neuro grossly intact  A/P  1 paroxysmal atrial fibrillation-continue Toprol  and Xarelto .  Check hemoglobin and renal function.  2 hypertension-patient's blood pressure is controlled.  Continue present medications and follow.  3 hyperlipidemia-continue Lipitor and Zetia .  Check lipids and liver.  4 question history of mitral valve prolapse-not evident on most recent echocardiogram.  5 aortic insufficiency-mild on most recent echocardiogram.  6 carotid artery disease-mild on most recent Dopplers.  7 history of subclavian stenosis-continue statin.  Redell Shallow, MD

## 2023-11-13 ENCOUNTER — Encounter: Payer: Self-pay | Admitting: Cardiology

## 2023-11-13 ENCOUNTER — Ambulatory Visit: Payer: Medicare PPO | Attending: Cardiology | Admitting: Cardiology

## 2023-11-13 VITALS — BP 112/80 | HR 50 | Ht 64.0 in | Wt 124.2 lb

## 2023-11-13 DIAGNOSIS — I480 Paroxysmal atrial fibrillation: Secondary | ICD-10-CM

## 2023-11-13 DIAGNOSIS — I10 Essential (primary) hypertension: Secondary | ICD-10-CM

## 2023-11-13 DIAGNOSIS — E7800 Pure hypercholesterolemia, unspecified: Secondary | ICD-10-CM | POA: Diagnosis not present

## 2023-11-13 DIAGNOSIS — I059 Rheumatic mitral valve disease, unspecified: Secondary | ICD-10-CM

## 2023-11-13 NOTE — Patient Instructions (Signed)
 Medication Instructions:  Your physician recommends that you continue on your current medications as directed. Please refer to the Current Medication list given to you today.  *If you need a refill on your cardiac medications before your next appointment, please call your pharmacy*   Lab Work: Your physician recommends that you have labs drawn today: CMET, CBC & Lipids  If you have labs (blood work) drawn today and your tests are completely normal, you will receive your results only by: MyChart Message (if you have MyChart) OR A paper copy in the mail If you have any lab test that is abnormal or we need to change your treatment, we will call you to review the results.    Follow-Up: At [facility], you and your health needs are our priority.  As part of our continuing mission to provide you with exceptional heart care, we have created designated Provider Care Teams.  These Care Teams include your primary Cardiologist (physician) and Advanced Practice Providers (APPs -  Physician Assistants and Nurse Practitioners) who all work together to provide you with the care you need, when you need it.  We recommend signing up for the patient portal called MyChart.  Sign up information is provided on this After Visit Summary.  MyChart is used to connect with patients for Virtual Visits (Telemedicine).  Patients are able to view lab/test results, encounter notes, upcoming appointments, etc.  Non-urgent messages can be sent to your provider as well.   To learn more about what you can do with MyChart, go to [email or web address].    Your next appointment:   12 month(s)  Provider:   Redell Shallow, MD     Other Instructions

## 2023-11-14 LAB — LIPID PANEL
Chol/HDL Ratio: 3.3 {ratio} (ref 0.0–4.4)
Cholesterol, Total: 147 mg/dL (ref 100–199)
HDL: 44 mg/dL (ref >39)
LDL Chol Calc (NIH): 77 mg/dL (ref 0–99)
Triglycerides: 147 mg/dL (ref 0–149)
VLDL Cholesterol Cal: 26 mg/dL (ref 5–40)

## 2023-11-14 LAB — COMPREHENSIVE METABOLIC PANEL
ALT: 20 [IU]/L (ref 0–32)
AST: 20 [IU]/L (ref 0–40)
Albumin: 4.8 g/dL (ref 3.8–4.8)
Alkaline Phosphatase: 74 [IU]/L (ref 44–121)
BUN/Creatinine Ratio: 16 (ref 12–28)
BUN: 17 mg/dL (ref 8–27)
Bilirubin Total: 0.5 mg/dL (ref 0.0–1.2)
CO2: 22 mmol/L (ref 20–29)
Calcium: 9.5 mg/dL (ref 8.7–10.3)
Chloride: 102 mmol/L (ref 96–106)
Creatinine, Ser: 1.08 mg/dL — ABNORMAL HIGH (ref 0.57–1.00)
Globulin, Total: 2.6 g/dL (ref 1.5–4.5)
Glucose: 92 mg/dL (ref 70–99)
Potassium: 4.5 mmol/L (ref 3.5–5.2)
Sodium: 142 mmol/L (ref 134–144)
Total Protein: 7.4 g/dL (ref 6.0–8.5)
eGFR: 53 mL/min/{1.73_m2} — ABNORMAL LOW (ref >59)

## 2023-11-14 LAB — CBC
Hematocrit: 41.4 % (ref 34.0–46.6)
Hemoglobin: 13.6 g/dL (ref 11.1–15.9)
MCH: 29.6 pg (ref 26.6–33.0)
MCHC: 32.9 g/dL (ref 31.5–35.7)
MCV: 90 fL (ref 79–97)
Platelets: 354 10*3/uL (ref 150–450)
RBC: 4.6 x10E6/uL (ref 3.77–5.28)
RDW: 12.5 % (ref 11.7–15.4)
WBC: 7.2 10*3/uL (ref 3.4–10.8)

## 2023-11-17 ENCOUNTER — Other Ambulatory Visit: Payer: Self-pay | Admitting: Nurse Practitioner

## 2023-11-27 ENCOUNTER — Other Ambulatory Visit: Payer: Self-pay | Admitting: Nurse Practitioner

## 2023-11-27 DIAGNOSIS — F5105 Insomnia due to other mental disorder: Secondary | ICD-10-CM

## 2023-11-30 ENCOUNTER — Ambulatory Visit (INDEPENDENT_AMBULATORY_CARE_PROVIDER_SITE_OTHER): Payer: Medicare PPO | Admitting: Nurse Practitioner

## 2023-11-30 ENCOUNTER — Encounter: Payer: Self-pay | Admitting: Nurse Practitioner

## 2023-11-30 VITALS — BP 128/80 | HR 64 | Temp 98.1°F | Wt 124.8 lb

## 2023-11-30 DIAGNOSIS — E038 Other specified hypothyroidism: Secondary | ICD-10-CM

## 2023-11-30 DIAGNOSIS — M810 Age-related osteoporosis without current pathological fracture: Secondary | ICD-10-CM

## 2023-11-30 DIAGNOSIS — R0982 Postnasal drip: Secondary | ICD-10-CM

## 2023-11-30 DIAGNOSIS — F5105 Insomnia due to other mental disorder: Secondary | ICD-10-CM

## 2023-11-30 LAB — T4, FREE: Free T4: 1.4 ng/dL (ref 0.60–1.60)

## 2023-11-30 LAB — TSH: TSH: 3.16 u[IU]/mL (ref 0.35–5.50)

## 2023-11-30 MED ORDER — TEMAZEPAM 7.5 MG PO CAPS
7.5000 mg | ORAL_CAPSULE | Freq: Every day | ORAL | 5 refills | Status: DC
Start: 2023-11-30 — End: 2024-06-07

## 2023-11-30 MED ORDER — LEVOTHYROXINE SODIUM 50 MCG PO TABS: 50.0000 ug | ORAL_TABLET | Freq: Every day | ORAL | 1 refills | Status: DC

## 2023-11-30 NOTE — Addendum Note (Signed)
Addended by: Michaela Corner on: 11/30/2023 01:35 PM   Modules accepted: Orders

## 2023-11-30 NOTE — Progress Notes (Addendum)
Established Patient Visit  Patient: Patricia Peck   DOB: 12-07-1945   78 y.o. Female  MRN: [id number] Visit Date: 11/30/2023  Subjective:    Chief Complaint  Patient presents with   Medical Managment for Chronic Issues     6 month follow up hypothyroidism, HTN, hyperlipidemia (fasting).   HPI Vasomotor rhinitis Singulair, zyrtec, allegra, flonase PND, worse in AM No GERD  PND (post-nasal drip) Chronic, worse in AM, coughs clear mucus. No dysphagia, no hoarseness, no sore throat. GERD symptoms controlled with use of nexium in Am and pepcid in PM. Previous use of zyrtec, montelukast and dymista-no improvement. She had eval by ENT in past. Hx of dry mouth-current use of biotin. Denies any reported snoring.  Advised to switch zyrtec to claritin. She decided against GI referral at this time due to lack of dysphagia or weight loss or ABDOMEN pain or nausea.  Insomnia Denies any adverse effects with restoril 7.5mg  PMP database reviewed today Reports need for sleep aid daily at hs. Med refill sent  Osteoporosis Current use of prolia inj, calcium and vit. D Last prolia injection 08/2023, scheduled for next dose 02/02/2024 Last dexa scan 2021 Scheduled for repeat dexa scan 02/09/2024 Current use of calcium and vit D supplement daily  Hypothyroidism Repeat Tsh and T4: stable Maintain levothyroxine dose  BP Readings from Last 3 Encounters:  11/30/23 128/80  11/13/23 112/80  07/07/23 130/82    Wt Readings from Last 3 Encounters:  11/30/23 124 lb 12.8 oz (56.6 kg)  11/13/23 124 lb 3.2 oz (56.3 kg)  07/07/23 123 lb 9.6 oz (56.1 kg)    Reviewed medical, surgical, and social history today  Medications: Outpatient Medications Prior to Visit  Medication Sig Note   Alpha-D-Galactosidase (BEANO PO) Take 1 tablet by mouth 2 (two) times daily.    amLODipine (NORVASC) 10 MG tablet Take 1 tablet (10 mg total) by mouth daily.    atorvastatin (LIPITOR) 80 MG tablet  TAKE 1 TABLET(80 MG) BY MOUTH DAILY    Benzocaine-Resorcinol (ANTI-ITCH VAGINAL) 20-3 % CREA     bumetanide (BUMEX) 1 MG tablet TAKE 1 TABLET(1 MG) BY MOUTH TWICE DAILY    Calcium Carbonate-Vitamin D 600-400 MG-UNIT tablet Take 1 tablet by mouth 2 (two) times daily.    Cholecalciferol (VITAMIN D-3) 5000 UNITS TABS Take 5,000 Units by mouth 3 (three) times a week. Tuesday Thursday Saturday    COMIRNATY syringe     Cranberry 450 MG CAPS Take 2 capsules by mouth daily.    denosumab (PROLIA) 60 MG/ML SOLN injection Inject 60 mg into the skin once. Administer in upper arm, thigh, or abdomen    esomeprazole (NEXIUM) 40 MG capsule TAKE 1 CAPSULE BY MOUTH EVERY DAY AT NOON    estradiol (ESTRACE) 0.1 MG/GM vaginal cream Place 1 Applicatorful vaginally 3 (three) times a week.    ezetimibe (ZETIA) 10 MG tablet Take 1 tablet (10 mg total) by mouth daily.    famotidine (PEPCID) 20 MG tablet Take 20 mg by mouth at bedtime.    loratadine (CLARITIN) 10 MG tablet Take 10 mg by mouth daily.    Meth-Hyo-M Bl-Na Phos-Ph Sal (URO-MP) 118 MG CAPS SMARTSIG:2 Capsule(s) By Mouth Morning-Night    metoprolol succinate (TOPROL-XL) 50 MG 24 hr tablet Take 1 tablet (50 mg total) by mouth daily.    montelukast (SINGULAIR) 10 MG tablet TAKE 1 TABLET(10 MG) BY MOUTH AT BEDTIME  OVER THE COUNTER MEDICATION Take 2 tablets by mouth daily. Bounty  Hair,Nail, and Skin daily gummies.    potassium chloride (KLOR-CON) 10 MEQ tablet Take 1 tablet (10 mEq total) by mouth 2 (two) times daily.    rOPINIRole (REQUIP) 0.25 MG tablet TAKE 1 TABLET(0.25 MG) BY MOUTH AT BEDTIME    trimethoprim (TRIMPEX) 100 MG tablet Take 1 tablet (100 mg total) by mouth daily.    vitamin E 180 MG (400 UNITS) capsule Take 400 Units by mouth 3 (three) times a week. Tuesdays, Thursdays, and Saturdays    XARELTO 15 MG TABS tablet TAKE 1 TABLET(15 MG) BY MOUTH DAILY WITH SUPPER    [DISCONTINUED] Azelastine-Fluticasone 137-50 MCG/ACT SUSP Place 1 spray into both  nostrils daily.    [DISCONTINUED] cetirizine (ZYRTEC) 5 MG tablet Take 1 tablet (5 mg total) by mouth at bedtime.    [DISCONTINUED] FLUZONE HIGH-DOSE 0.5 ML injection     [DISCONTINUED] levothyroxine (SYNTHROID) 50 MCG tablet Take 1 tablet (50 mcg total) by mouth daily before breakfast.    [DISCONTINUED] methylPREDNISolone (MEDROL DOSEPAK) 4 MG TBPK tablet Take as directed on package    [DISCONTINUED] nystatin (MYCOSTATIN) 100000 UNIT/ML suspension Use as directed 5 mLs (500,000 Units total) in the mouth or throat 4 (four) times daily.    [DISCONTINUED] temazepam (RESTORIL) 7.5 MG capsule Take 1 capsule (7.5 mg total) by mouth at bedtime as needed for sleep. 11/30/2023: Refills needed    No facility-administered medications prior to visit.   Reviewed past medical and social history.   ROS per HPI above  Last CBC Lab Results  Component Value Date   WBC 7.2 11/13/2023   HGB 13.6 11/13/2023   HCT 41.4 11/13/2023   MCV 90 11/13/2023   MCH 29.6 11/13/2023   RDW 12.5 11/13/2023   PLT 354 11/13/2023   Last metabolic panel Lab Results  Component Value Date   GLUCOSE 92 11/13/2023   NA 142 11/13/2023   K 4.5 11/13/2023   CL 102 11/13/2023   CO2 22 11/13/2023   BUN 17 11/13/2023   CREATININE 1.08 (H) 11/13/2023   EGFR 53 (L) 11/13/2023   CALCIUM 9.5 11/13/2023   PROT 7.4 11/13/2023   ALBUMIN 4.8 11/13/2023   LABGLOB 2.6 11/13/2023   AGRATIO 2.0 09/12/2022   BILITOT 0.5 11/13/2023   ALKPHOS 74 11/13/2023   AST 20 11/13/2023   ALT 20 11/13/2023   ANIONGAP 10 02/12/2021   Last lipids Lab Results  Component Value Date   CHOL 147 11/13/2023   HDL 44 11/13/2023   LDLCALC 77 11/13/2023   LDLDIRECT 129.2 05/28/2007   TRIG 147 11/13/2023   CHOLHDL 3.3 11/13/2023      Objective:  BP 128/80 (BP Location: Left Arm, Patient Position: Sitting, Cuff Size: Small)   Pulse 64   Temp 98.1 F (36.7 C) (Temporal)   Wt 124 lb 12.8 oz (56.6 kg)   SpO2 98%   BMI 21.42 kg/m       Physical Exam Vitals and nursing note reviewed.  Constitutional:      General: She is not in acute distress. HENT:     Head:     Jaw: There is normal jaw occlusion.     Salivary Glands: Right salivary gland is not diffusely enlarged or tender. Left salivary gland is not diffusely enlarged or tender.     Mouth/Throat:     Mouth: Mucous membranes are dry. No oral lesions.     Pharynx: Oropharynx is clear. Uvula midline. No postnasal  drip.  Neck:     Thyroid: No thyroid mass, thyromegaly or thyroid tenderness.  Cardiovascular:     Pulses: Normal pulses.     Heart sounds: Normal heart sounds.  Pulmonary:     Effort: Pulmonary effort is normal.     Breath sounds: Normal breath sounds.  Lymphadenopathy:     Cervical: No cervical adenopathy.  Neurological:     Mental Status: She is alert and oriented to person, place, and time.  Psychiatric:        Mood and Affect: Mood normal.        Behavior: Behavior normal.        Thought Content: Thought content normal.     Results for orders placed or performed in visit on 11/30/23  T4, free  Result Value Ref Range   Free T4 1.40 0.60 - 1.60 ng/dL  TSH  Result Value Ref Range   TSH 3.16 0.35 - 5.50 uIU/mL      Assessment & Plan:    Problem List Items Addressed This Visit     Hypothyroidism - Primary   Repeat Tsh and T4: stable Maintain levothyroxine dose      Relevant Medications   levothyroxine (SYNTHROID) 50 MCG tablet   Other Relevant Orders   T4, free (Completed)   TSH (Completed)   Insomnia   Denies any adverse effects with restoril 7.5mg  PMP database reviewed today Reports need for sleep aid daily at hs. Med refill sent      Relevant Medications   temazepam (RESTORIL) 7.5 MG capsule   Osteoporosis   Current use of prolia inj, calcium and vit. D Last prolia injection 08/2023, scheduled for next dose 02/02/2024 Last dexa scan 2021 Scheduled for repeat dexa scan 02/09/2024 Current use of calcium and vit D  supplement daily      PND (post-nasal drip)   Chronic, worse in AM, coughs clear mucus. No dysphagia, no hoarseness, no sore throat. GERD symptoms controlled with use of nexium in Am and pepcid in PM. Previous use of zyrtec, montelukast and dymista-no improvement. She had eval by ENT in past. Hx of dry mouth-current use of biotin. Denies any reported snoring.  Advised to switch zyrtec to claritin. She decided against GI referral at this time due to lack of dysphagia or weight loss or ABDOMEN pain or nausea.      Return in about 6 months (around 05/29/2024) for Hypothyroidism.     Alysia Penna, NP

## 2023-11-30 NOTE — Patient Instructions (Addendum)
Next prolia injection is due 02/02/2024. Maintain appointment for bone density. Stop zyrtec Start Claritin 10mg  in PM Go to lab

## 2023-11-30 NOTE — Assessment & Plan Note (Signed)
Current use of prolia inj, calcium and vit. D Last prolia injection 08/2023, scheduled for next dose 02/02/2024 Last dexa scan 2021 Scheduled for repeat dexa scan 02/09/2024 Current use of calcium and vit D supplement daily

## 2023-11-30 NOTE — Assessment & Plan Note (Signed)
Denies any adverse effects with restoril 7.5mg  PMP database reviewed today Reports need for sleep aid daily at hs. Med refill sent

## 2023-11-30 NOTE — Assessment & Plan Note (Signed)
Singulair, zyrtec, allegra, flonase PND, worse in AM No GERD

## 2023-11-30 NOTE — Assessment & Plan Note (Addendum)
Chronic, worse in AM, coughs clear mucus. No dysphagia, no hoarseness, no sore throat. GERD symptoms controlled with use of nexium in Am and pepcid in PM. Previous use of zyrtec, montelukast and dymista-no improvement. She had eval by ENT in past. Hx of dry mouth-current use of biotin. Denies any reported snoring.  Advised to switch zyrtec to claritin. She decided against GI referral at this time due to lack of dysphagia or weight loss or ABDOMEN pain or nausea.

## 2023-11-30 NOTE — Assessment & Plan Note (Addendum)
Repeat Tsh and T4: stable Maintain levothyroxine dose

## 2023-12-22 DIAGNOSIS — N3946 Mixed incontinence: Secondary | ICD-10-CM | POA: Diagnosis not present

## 2023-12-22 DIAGNOSIS — N3021 Other chronic cystitis with hematuria: Secondary | ICD-10-CM | POA: Diagnosis not present

## 2023-12-22 DIAGNOSIS — I10 Essential (primary) hypertension: Secondary | ICD-10-CM | POA: Diagnosis not present

## 2023-12-22 DIAGNOSIS — R52 Pain, unspecified: Secondary | ICD-10-CM | POA: Diagnosis not present

## 2023-12-22 DIAGNOSIS — N1831 Chronic kidney disease, stage 3a: Secondary | ICD-10-CM | POA: Diagnosis not present

## 2023-12-22 DIAGNOSIS — U071 COVID-19: Secondary | ICD-10-CM | POA: Diagnosis not present

## 2023-12-22 DIAGNOSIS — R059 Cough, unspecified: Secondary | ICD-10-CM | POA: Diagnosis not present

## 2024-01-11 DIAGNOSIS — D225 Melanocytic nevi of trunk: Secondary | ICD-10-CM | POA: Diagnosis not present

## 2024-01-11 DIAGNOSIS — Z1283 Encounter for screening for malignant neoplasm of skin: Secondary | ICD-10-CM | POA: Diagnosis not present

## 2024-01-11 DIAGNOSIS — L820 Inflamed seborrheic keratosis: Secondary | ICD-10-CM | POA: Diagnosis not present

## 2024-01-27 ENCOUNTER — Other Ambulatory Visit: Payer: Self-pay

## 2024-01-27 MED ORDER — DENOSUMAB 60 MG/ML ~~LOC~~ SOSY
60.0000 mg | PREFILLED_SYRINGE | Freq: Once | SUBCUTANEOUS | 0 refills | Status: DC
Start: 2024-01-27 — End: 2024-08-15
  Filled 2024-01-27: qty 1, 1d supply, fill #0
  Filled 2024-02-02: qty 1, 180d supply, fill #0
  Filled 2024-02-03 (×2): qty 1, 90d supply, fill #0

## 2024-01-27 MED ORDER — DENOSUMAB 60 MG/ML ~~LOC~~ SOSY
60.0000 mg | PREFILLED_SYRINGE | Freq: Once | SUBCUTANEOUS | 0 refills | Status: DC
Start: 2024-01-27 — End: 2024-01-27
  Filled 2024-01-27: qty 1, 1d supply, fill #0

## 2024-01-27 NOTE — Addendum Note (Signed)
 Addended by: Larey Dresser on: 01/27/2024 03:19 PM   Modules accepted: Orders

## 2024-01-27 NOTE — Addendum Note (Signed)
 Addended by: Larey Dresser on: 01/27/2024 03:23 PM   Modules accepted: Orders

## 2024-01-28 ENCOUNTER — Other Ambulatory Visit: Payer: Self-pay

## 2024-01-28 NOTE — Progress Notes (Addendum)
 Pharmacy Patient Advocate Encounter  Insurance verification completed.   The patient is insured through [place] and [employer] for [employer]. Co-pay is $0.  This test claim was processed through [facility]- copay amounts may vary at other pharmacies due to pharmacy/plan contracts, or as the patient moves through the different stages of their insurance plan.

## 2024-02-02 ENCOUNTER — Telehealth: Payer: Self-pay

## 2024-02-02 ENCOUNTER — Ambulatory Visit: Payer: Medicare PPO

## 2024-02-02 ENCOUNTER — Other Ambulatory Visit: Payer: Self-pay

## 2024-02-02 ENCOUNTER — Other Ambulatory Visit (HOSPITAL_COMMUNITY): Payer: Self-pay

## 2024-02-02 DIAGNOSIS — M810 Age-related osteoporosis without current pathological fracture: Secondary | ICD-10-CM

## 2024-02-02 NOTE — Telephone Encounter (Signed)
 Copied from CRM [id number]. Topic: General - Other >> Feb 02, 2024  2:09 PM Gurney Maxin H wrote: Reason for CRM: Patient states she's returning a call to [facility] the office manager, patient states she has spoke with the pharmacy and returning call to [facility], please reach out to patient, thanks.  Takeyla [phone]

## 2024-02-02 NOTE — Telephone Encounter (Signed)
 Pt came into the office today for a scheduled [place] to receive Prolia injection. Prolia was not here in the office. Pt states that she called the office yesterday to ask if injection was at the office and was told it was.  I called [facility] to check the status of the medication, as I ordered it on Wed 01/27/24. Spoke with [place], she states that they called the pt on Fri 01/29/24 and LVM to return their call. They need the pt to pay the $64 copay prior to sipping the medication to the office. I informed the pt of this information and provided the number to [facility], advised her to call. Pt states she should not have to pay anything and she has never paid a copay. Also advised pt that we would be placing a referral to [facility] for future Prolia medication management. Pt understands and will call [facility].

## 2024-02-03 ENCOUNTER — Other Ambulatory Visit: Payer: Self-pay

## 2024-02-03 NOTE — Telephone Encounter (Signed)
 Forwarding message below

## 2024-02-03 NOTE — Progress Notes (Signed)
 Specialty Pharmacy Initial Fill Coordination Note  Vanessa Kick NISHIKA PARKHURST is a 78 y.o. female contacted today regarding initial fill of specialty medication(s) Denosumab (PROLIA)   Patient requested Courier to Provider Office   Delivery date: 02/04/24   Verified address: [facility] at [address]   Medication will be filled on 4/2.   Patient is aware of $0 copayment.

## 2024-02-03 NOTE — Telephone Encounter (Signed)
 Copied from CRM [id number]. Topic: Appointments - Appointment Scheduling >> Feb 03, 2024  1:34 PM Almira Coaster wrote: Patient/patient representative is calling to schedule an appointment. Refer to attachments for appointment information. Patient was calling to schedule her prolia injection, she states [facility] will be shipping the injection to the office. She would like to come in Friday morning as early as possible. Please confirm with patient once the prolia has reached the office.

## 2024-02-08 ENCOUNTER — Other Ambulatory Visit: Payer: Self-pay | Admitting: Nurse Practitioner

## 2024-02-08 DIAGNOSIS — M810 Age-related osteoporosis without current pathological fracture: Secondary | ICD-10-CM

## 2024-02-08 DIAGNOSIS — Z1231 Encounter for screening mammogram for malignant neoplasm of breast: Secondary | ICD-10-CM

## 2024-02-09 ENCOUNTER — Ambulatory Visit (INDEPENDENT_AMBULATORY_CARE_PROVIDER_SITE_OTHER)
Admission: RE | Admit: 2024-02-09 | Discharge: 2024-02-09 | Disposition: A | Discharge status: Home / Self Care | Source: Ambulatory Visit | Attending: Nurse Practitioner

## 2024-02-09 ENCOUNTER — Other Ambulatory Visit: Payer: Self-pay | Admitting: Nurse Practitioner

## 2024-02-09 ENCOUNTER — Ambulatory Visit
Admission: RE | Admit: 2024-02-09 | Discharge: 2024-02-09 | Disposition: A | Discharge status: Home / Self Care | Payer: Medicare PPO | Source: Ambulatory Visit | Attending: Nurse Practitioner

## 2024-02-09 DIAGNOSIS — M810 Age-related osteoporosis without current pathological fracture: Secondary | ICD-10-CM

## 2024-02-09 DIAGNOSIS — Z1231 Encounter for screening mammogram for malignant neoplasm of breast: Secondary | ICD-10-CM

## 2024-02-10 ENCOUNTER — Encounter: Payer: Self-pay | Admitting: Nurse Practitioner

## 2024-02-10 ENCOUNTER — Ambulatory Visit

## 2024-02-10 DIAGNOSIS — M810 Age-related osteoporosis without current pathological fracture: Secondary | ICD-10-CM

## 2024-02-10 MED ORDER — DENOSUMAB 60 MG/ML ~~LOC~~ SOSY
60.0000 mg | PREFILLED_SYRINGE | Freq: Once | SUBCUTANEOUS | Status: AC
  Administered 2024-02-10: 60 mg via SUBCUTANEOUS

## 2024-02-10 NOTE — Addendum Note (Signed)
 Addended by: Alysia Penna L on: 02/10/2024 03:02 PM   Modules accepted: Orders

## 2024-02-10 NOTE — Progress Notes (Signed)
 After obtaining consent, and per orders of [facility], injection of Prolia 60mg  given SubQ in the left upper arm by Pamala Hurry Pike-White. Patient instructed to remain in clinic for 20 minutes afterwards, and to report any adverse reaction to me immediately. Pt toleration injection well.

## 2024-02-10 NOTE — Assessment & Plan Note (Signed)
 No improvement noted on repeat dexa scan Entered ref to [facility] clinic

## 2024-02-10 NOTE — Patient Instructions (Signed)
 Pt will go to the [facility] for future Prolia injections and medication management. Next inj due 08/11/24.

## 2024-02-19 ENCOUNTER — Other Ambulatory Visit (HOSPITAL_COMMUNITY): Payer: Self-pay

## 2024-02-24 ENCOUNTER — Other Ambulatory Visit: Payer: Self-pay | Admitting: Nurse Practitioner

## 2024-02-24 DIAGNOSIS — I10 Essential (primary) hypertension: Secondary | ICD-10-CM

## 2024-02-24 NOTE — Telephone Encounter (Signed)
 Called and left a voice message per DPR asking to give me a call back at the office at [phone] pertaining to refills request and office appointment.

## 2024-02-24 NOTE — Telephone Encounter (Addendum)
 Medication: Bumetanide  Directions: 1 tablet by mouth daily Last given: 08/12/2023 Number refills: 3 Last o/v: 11/30/2023 Follow up: 6 months (around 05/29/2024)  Labs:  11/30/2023

## 2024-02-25 ENCOUNTER — Other Ambulatory Visit: Payer: Self-pay | Admitting: Nurse Practitioner

## 2024-02-25 DIAGNOSIS — K219 Gastro-esophageal reflux disease without esophagitis: Secondary | ICD-10-CM

## 2024-02-25 NOTE — Telephone Encounter (Signed)
 Medication: Esomeprazole  (Nexium ) 40 mg  Directions: Take 1 tablet by mouth daily  Last given: 09/02/23 Number refills: 1 Last o/v: 11/30/23 Follow up: 6 month f/u (around 02/02/24) Labs: 11/30/23   Patient needs an office appointment. Called and left a voice message per DPR on file asking to give me a call back at the office.

## 2024-02-26 ENCOUNTER — Telehealth: Payer: Self-pay

## 2024-02-26 DIAGNOSIS — I10 Essential (primary) hypertension: Secondary | ICD-10-CM

## 2024-02-26 NOTE — Telephone Encounter (Signed)
 Copied from CRM [id number]. Topic: General - Other >> Feb 26, 2024  1:45 PM Dorisann Garre T wrote: Reason for CRM: patient is needing a call back regarding her medication bumetanide  (BUMEX ) 1 MG tablet she is suppose to get 180 tablets and state the pharmacy stated the dr changed it she wants to know why

## 2024-02-26 NOTE — Telephone Encounter (Signed)
 Returned call to patient and explained the reason for not getting 180 tablets. She stated that she did call the office last week to make an appointment and that she was told that there were not any available. I informed her that I will speak with Soyla Duverney and give her a call back on Monday 02/29/24 about getting her scheduled if possible for a sooner appointment. She thanked me for calling and will be looking for my call on Monday.

## 2024-02-29 ENCOUNTER — Telehealth: Payer: Self-pay | Admitting: Nurse Practitioner

## 2024-02-29 NOTE — Telephone Encounter (Signed)
 Tried calling patient on home and the line was busy.

## 2024-02-29 NOTE — Addendum Note (Signed)
 Addended by: Kathrene Parents L on: 02/29/2024 11:39 AM   Modules accepted: Orders

## 2024-02-29 NOTE — Telephone Encounter (Signed)
 Copied from CRM [id number]. Topic: General - Call Back - No Documentation >> Feb 29, 2024 10:56 AM Keitha Pata L wrote: Reason for CRM: patient is requesting a call back from the doctor or nurse terrah at [phone] in reference to medication

## 2024-02-29 NOTE — Telephone Encounter (Signed)
 Called patient and informed her of Charlotte's comments. Patient is scheduled for a lab appointment for 03/01/24 and a follow up appointment for 05/05/24.

## 2024-03-01 ENCOUNTER — Encounter: Payer: Self-pay | Admitting: Nurse Practitioner

## 2024-03-01 ENCOUNTER — Other Ambulatory Visit (INDEPENDENT_AMBULATORY_CARE_PROVIDER_SITE_OTHER)

## 2024-03-01 ENCOUNTER — Other Ambulatory Visit: Payer: Self-pay | Admitting: Nurse Practitioner

## 2024-03-01 DIAGNOSIS — T148XXA Other injury of unspecified body region, initial encounter: Secondary | ICD-10-CM | POA: Diagnosis not present

## 2024-03-01 DIAGNOSIS — I10 Essential (primary) hypertension: Secondary | ICD-10-CM | POA: Diagnosis not present

## 2024-03-01 DIAGNOSIS — E876 Hypokalemia: Secondary | ICD-10-CM

## 2024-03-01 LAB — RENAL FUNCTION PANEL
Albumin: 4.6 g/dL (ref 3.5–5.2)
BUN: 16 mg/dL (ref 6–23)
CO2: 28 meq/L (ref 19–32)
Calcium: 9.7 mg/dL (ref 8.4–10.5)
Chloride: 103 meq/L (ref 96–112)
Creatinine, Ser: 1.12 mg/dL (ref 0.40–1.20)
GFR: 47.03 mL/min — ABNORMAL LOW (ref >60.00)
Glucose, Bld: 93 mg/dL (ref 70–99)
Phosphorus: 3.4 mg/dL (ref 2.3–4.6)
Potassium: 4.4 meq/L (ref 3.5–5.1)
Sodium: 140 meq/L (ref 135–145)

## 2024-03-01 MED ORDER — POTASSIUM CHLORIDE ER 10 MEQ PO TBCR: 10.0000 meq | EXTENDED_RELEASE_TABLET | Freq: Every day | ORAL | 0 refills | Status: DC

## 2024-03-01 MED ORDER — BUMETANIDE 1 MG PO TABS: 1.0000 mg | ORAL_TABLET | Freq: Every day | ORAL | 0 refills | Status: DC

## 2024-03-03 DIAGNOSIS — B957 Other staphylococcus as the cause of diseases classified elsewhere: Secondary | ICD-10-CM | POA: Diagnosis not present

## 2024-03-03 DIAGNOSIS — N390 Urinary tract infection, site not specified: Secondary | ICD-10-CM | POA: Diagnosis not present

## 2024-03-03 DIAGNOSIS — R350 Frequency of micturition: Secondary | ICD-10-CM | POA: Diagnosis not present

## 2024-03-07 ENCOUNTER — Ambulatory Visit: Admitting: Physician Assistant

## 2024-03-14 ENCOUNTER — Telehealth: Payer: Self-pay | Admitting: Nurse Practitioner

## 2024-03-14 NOTE — Telephone Encounter (Unsigned)
 Copied from CRM [id number]. Topic: Clinical - Medical Advice >> Mar 14, 2024  9:42 AM Danae Duncans wrote: Reason for CRM: Pt advise no bowel movement in 4 days- back hurting - pt would like any recommendation on over the counter medicine to take or possible be prescribe something to help use the bathroom, pt would like a callback at [phone]

## 2024-03-15 ENCOUNTER — Emergency Department (HOSPITAL_BASED_OUTPATIENT_CLINIC_OR_DEPARTMENT_OTHER)

## 2024-03-15 ENCOUNTER — Emergency Department (HOSPITAL_BASED_OUTPATIENT_CLINIC_OR_DEPARTMENT_OTHER)
Admission: EM | Admit: 2024-03-15 | Discharge: 2024-03-15 | Disposition: A | Discharge status: Home / Self Care | Attending: Emergency Medicine | Admitting: Emergency Medicine

## 2024-03-15 ENCOUNTER — Encounter (HOSPITAL_BASED_OUTPATIENT_CLINIC_OR_DEPARTMENT_OTHER): Payer: Self-pay | Admitting: Emergency Medicine

## 2024-03-15 ENCOUNTER — Other Ambulatory Visit: Payer: Self-pay

## 2024-03-15 DIAGNOSIS — K769 Liver disease, unspecified: Secondary | ICD-10-CM | POA: Insufficient documentation

## 2024-03-15 DIAGNOSIS — Z7901 Long term (current) use of anticoagulants: Secondary | ICD-10-CM | POA: Diagnosis not present

## 2024-03-15 DIAGNOSIS — R1031 Right lower quadrant pain: Secondary | ICD-10-CM | POA: Diagnosis not present

## 2024-03-15 DIAGNOSIS — M549 Dorsalgia, unspecified: Secondary | ICD-10-CM | POA: Diagnosis not present

## 2024-03-15 DIAGNOSIS — K449 Diaphragmatic hernia without obstruction or gangrene: Secondary | ICD-10-CM | POA: Diagnosis not present

## 2024-03-15 DIAGNOSIS — K5900 Constipation, unspecified: Secondary | ICD-10-CM | POA: Insufficient documentation

## 2024-03-15 DIAGNOSIS — R109 Unspecified abdominal pain: Secondary | ICD-10-CM | POA: Diagnosis not present

## 2024-03-15 DIAGNOSIS — M791 Myalgia, unspecified site: Secondary | ICD-10-CM | POA: Diagnosis not present

## 2024-03-15 DIAGNOSIS — M7918 Myalgia, other site: Secondary | ICD-10-CM

## 2024-03-15 DIAGNOSIS — Z90710 Acquired absence of both cervix and uterus: Secondary | ICD-10-CM | POA: Diagnosis not present

## 2024-03-15 DIAGNOSIS — N189 Chronic kidney disease, unspecified: Secondary | ICD-10-CM | POA: Diagnosis not present

## 2024-03-15 LAB — COMPREHENSIVE METABOLIC PANEL WITH GFR
ALT: 17 U/L (ref 0–44)
AST: 28 U/L (ref 15–41)
Albumin: 4.4 g/dL (ref 3.5–5.0)
Alkaline Phosphatase: 97 U/L (ref 38–126)
Anion gap: 16 — ABNORMAL HIGH (ref 5–15)
BUN: 13 mg/dL (ref 8–23)
CO2: 19 mmol/L — ABNORMAL LOW (ref 22–32)
Calcium: 8.9 mg/dL (ref 8.9–10.3)
Chloride: 100 mmol/L (ref 98–111)
Creatinine, Ser: 1 mg/dL (ref 0.44–1.00)
GFR, Estimated: 58 mL/min — ABNORMAL LOW (ref >60)
Glucose, Bld: 118 mg/dL — ABNORMAL HIGH (ref 70–99)
Potassium: 3.9 mmol/L (ref 3.5–5.1)
Sodium: 135 mmol/L (ref 135–145)
Total Bilirubin: 0.6 mg/dL (ref 0.0–1.2)
Total Protein: 7.4 g/dL (ref 6.5–8.1)

## 2024-03-15 LAB — CBC
HCT: 42 % (ref 36.0–46.0)
Hemoglobin: 14 g/dL (ref 12.0–15.0)
MCH: 29.1 pg (ref 26.0–34.0)
MCHC: 33.3 g/dL (ref 30.0–36.0)
MCV: 87.3 fL (ref 80.0–100.0)
Platelets: 226 10*3/uL (ref 150–400)
RBC: 4.81 MIL/uL (ref 3.87–5.11)
RDW: 12.8 % (ref 11.5–15.5)
WBC: 10.6 10*3/uL — ABNORMAL HIGH (ref 4.0–10.5)
nRBC: 0 % (ref 0.0–0.2)

## 2024-03-15 LAB — URINALYSIS, ROUTINE W REFLEX MICROSCOPIC
Bilirubin Urine: NEGATIVE
Glucose, UA: NEGATIVE mg/dL
Hgb urine dipstick: NEGATIVE
Ketones, ur: NEGATIVE mg/dL
Leukocytes,Ua: NEGATIVE
Nitrite: NEGATIVE
Protein, ur: NEGATIVE mg/dL
Specific Gravity, Urine: 1.012 (ref 1.005–1.030)
pH: 7 (ref 5.0–8.0)

## 2024-03-15 LAB — LIPASE, BLOOD: Lipase: 32 U/L (ref 11–51)

## 2024-03-15 MED ORDER — ACETAMINOPHEN ER 650 MG PO TBCR: 650.0000 mg | EXTENDED_RELEASE_TABLET | Freq: Four times a day (QID) | ORAL | 0 refills | Status: DC

## 2024-03-15 MED ORDER — IOHEXOL 300 MG/ML  SOLN
100.0000 mL | Freq: Once | INTRAMUSCULAR | Status: AC | PRN
  Administered 2024-03-15: 100 mL via INTRAVENOUS

## 2024-03-15 MED ORDER — TIZANIDINE HCL 4 MG PO CAPS: 4.0000 mg | ORAL_CAPSULE | Freq: Three times a day (TID) | ORAL | 0 refills | Status: DC

## 2024-03-15 MED ORDER — LIDOCAINE 4 % EX PTCH: 1.0000 | MEDICATED_PATCH | Freq: Two times a day (BID) | CUTANEOUS | 0 refills | Status: DC

## 2024-03-15 MED ORDER — MAGNESIUM CITRATE PO SOLN: 1.0000 | Freq: Once | ORAL | 0 refills | Status: AC

## 2024-03-15 MED ORDER — TIZANIDINE HCL 2 MG PO TABS
4.0000 mg | ORAL_TABLET | Freq: Once | ORAL | Status: AC
  Administered 2024-03-15: 4 mg via ORAL
  Filled 2024-03-15: qty 2

## 2024-03-15 MED ORDER — LIDOCAINE 5 % EX PTCH
1.0000 | MEDICATED_PATCH | CUTANEOUS | Status: DC
  Administered 2024-03-15: 1 via TRANSDERMAL
  Filled 2024-03-15: qty 1

## 2024-03-15 MED ORDER — ACETAMINOPHEN 325 MG PO TABS
650.0000 mg | ORAL_TABLET | Freq: Once | ORAL | Status: AC
  Administered 2024-03-15: 650 mg via ORAL
  Filled 2024-03-15: qty 2

## 2024-03-15 NOTE — Progress Notes (Incomplete)
 RLQ pain that radiates to the front

## 2024-03-15 NOTE — ED Provider Notes (Signed)
 [place] EMERGENCY DEPARTMENT AT [facility] Provider Note   CSN: [id number] Arrival date & time: 03/15/24  1324     History  Chief Complaint  Patient presents with   Back Pain    Patricia Peck is a 78 y.o. female.  HPI    78 year old female comes in with chief complaint of back pain.  Patient has history of paroxysmal A-fib, irritable bowel syndrome, chronic kidney disease.  She has been having pain in the back for the last 2 or 3 days.  The pain has worsened over time.  Pain is worse with any activity.  She has called her PCP, but unable to get in touch.  Additionally, she has not had a bowel movement in 5 days.  Patient is unsure if her symptoms are because of constipation.  She has no burning with urination, urinary frequency or blood in the urine.  Patient denies any nausea or vomiting.  She has reduced appetite.  She is passing flatus.  She has history of C-sections.  Patient has history of chronic constipation, she has taken stool softeners and mag citrate without any relief.  Home Medications Prior to Admission medications   Medication Sig Start Date End Date Taking? Authorizing Provider  acetaminophen  (TYLENOL  8 HOUR) 650 MG CR tablet Take 1 tablet (650 mg total) by mouth 4 (four) times daily. 03/15/24  Yes Deatra Face, MD  lidocaine  4 % Place 1 patch onto the skin 2 (two) times daily. 03/15/24  Yes Deatra Face, MD  magnesium  citrate SOLN Take 296 mLs (1 Bottle total) by mouth once for 1 dose. 03/15/24 03/15/24 Yes Deatra Face, MD  tiZANidine  (ZANAFLEX ) 4 MG capsule Take 1 capsule (4 mg total) by mouth 3 (three) times daily. 03/15/24  Yes Tarea Skillman, MD  Alpha-D-Galactosidase (BEANO PO) Take 1 tablet by mouth 2 (two) times daily.    [provider]  amLODipine  (NORVASC ) 10 MG tablet Take 1 tablet (10 mg total) by mouth daily. 05/19/23   Tania Familia, NP  atorvastatin  (LIPITOR) 80 MG tablet TAKE 1 TABLET(80 MG) BY MOUTH DAILY 04/14/23    Lenise Quince, MD  Benzocaine -Resorcinol (ANTI-ITCH VAGINAL) 20-3 % CREA     [provider]  bumetanide  (BUMEX ) 1 MG tablet Take 1 tablet (1 mg total) by mouth daily. 03/01/24   Nche, Connye Delaine, NP  Calcium  Carbonate-Vitamin D  600-400 MG-UNIT tablet Take 1 tablet by mouth 2 (two) times daily.    [provider]  Cholecalciferol (VITAMIN D -3) 5000 UNITS TABS Take 5,000 Units by mouth 3 (three) times a week. Tuesday Thursday Saturday    [provider]  COMIRNATY syringe  07/03/23   [provider]  Cranberry 450 MG CAPS Take 2 capsules by mouth daily.    [provider]  denosumab  (PROLIA ) 60 MG/ML SOLN injection Inject 60 mg into the skin once. Administer in upper arm, thigh, or abdomen 08/06/15   Aron, Talia M, MD  esomeprazole  (NEXIUM ) 40 MG capsule TAKE 1 CAPSULE BY MOUTH EVERY DAY AT NOON 02/25/24   Nche, Charlotte Lum, NP  estradiol (ESTRACE) 0.1 MG/GM vaginal cream Place 1 Applicatorful vaginally 3 (three) times a week. 01/11/21   [provider]  ezetimibe  (ZETIA ) 10 MG tablet Take 1 tablet (10 mg total) by mouth daily. 08/12/23   Lenise Quince, MD  famotidine  (PEPCID ) 20 MG tablet Take 20 mg by mouth at bedtime.    [provider]  levothyroxine  (SYNTHROID ) 50 MCG tablet Take 1  tablet (50 mcg total) by mouth daily before breakfast. 11/30/23   Nche, Connye Delaine, NP  loratadine  (CLARITIN ) 10 MG tablet Take 10 mg by mouth daily.    [provider]  Meth-Hyo-M Bl-Na Phos-Ph Sal (URO-MP) 118 MG CAPS SMARTSIG:2 Capsule(s) By Mouth Morning-Night    [provider]  metoprolol  succinate (TOPROL -XL) 50 MG 24 hr tablet Take 1 tablet (50 mg total) by mouth daily. 08/12/23   Lenise Quince, MD  montelukast  (SINGULAIR ) 10 MG tablet TAKE 1 TABLET(10 MG) BY MOUTH AT BEDTIME 11/17/23   Nche, Connye Delaine, NP  OVER THE COUNTER MEDICATION Take 2 tablets by mouth daily. Bounty  Hair,Nail, and Skin daily gummies.     [provider]  potassium chloride  (KLOR-CON ) 10 MEQ tablet Take 1 tablet (10 mEq total) by mouth daily. 03/01/24   Nche, Connye Delaine, NP  rOPINIRole  (REQUIP ) 0.25 MG tablet TAKE 1 TABLET(0.25 MG) BY MOUTH AT BEDTIME 10/20/22   Nche, Connye Delaine, NP  temazepam  (RESTORIL ) 7.5 MG capsule Take 1 capsule (7.5 mg total) by mouth at bedtime. 11/30/23   Nche, Connye Delaine, NP  trimethoprim  (TRIMPEX ) 100 MG tablet Take 1 tablet (100 mg total) by mouth daily. 12/01/22   Erman Hayward, MD  vitamin E 180 MG (400 UNITS) capsule Take 400 Units by mouth 3 (three) times a week. Tuesdays, Thursdays, and Saturdays    [provider]  XARELTO  15 MG TABS tablet TAKE 1 TABLET(15 MG) BY MOUTH DAILY WITH SUPPER 10/12/23   Lenise Quince, MD      Allergies    Patient has no known allergies.    Review of Systems   Review of Systems  All other systems reviewed and are negative.   Physical Exam Updated Vital Signs BP 114/83   Pulse 77   Temp 98 F (36.7 C) (Oral)   Resp 16   SpO2 91%  Physical Exam Vitals and nursing note reviewed.  Constitutional:      Appearance: She is well-developed.  HENT:     Head: Atraumatic.  Cardiovascular:     Rate and Rhythm: Normal rate.  Pulmonary:     Effort: Pulmonary effort is normal.  Abdominal:     General: There is no distension.     Tenderness: There is no abdominal tenderness.  Musculoskeletal:     Cervical back: Normal range of motion and neck supple.     Comments: Patient has reproducible tenderness over the right iliosacral region  Skin:    General: Skin is warm and dry.  Neurological:     Mental Status: She is alert and oriented to person, place, and time.     ED Results / Procedures / Treatments   Labs (all labs ordered are listed, but only abnormal results are displayed) Labs Reviewed  COMPREHENSIVE METABOLIC PANEL WITH GFR - Abnormal; Notable for the following components:      Result Value   CO2 19 (*)    Glucose,  Bld 118 (*)    GFR, Estimated 58 (*)    Anion gap 16 (*)    All other components within normal limits  CBC - Abnormal; Notable for the following components:   WBC 10.6 (*)    All other components within normal limits  LIPASE, BLOOD  URINALYSIS, ROUTINE W REFLEX MICROSCOPIC    EKG None  Radiology CT ABDOMEN PELVIS W CONTRAST Result Date: 03/15/2024 CLINICAL DATA:  Acute right lower quadrant abdominal pain. EXAM: CT ABDOMEN AND PELVIS WITH CONTRAST TECHNIQUE: Multidetector  CT imaging of the abdomen and pelvis was performed using the standard protocol following bolus administration of intravenous contrast. RADIATION DOSE REDUCTION: This exam was performed according to the departmental dose-optimization program which includes automated exposure control, adjustment of the mA and/or kV according to patient size and/or use of iterative reconstruction technique. CONTRAST:  OMNIPAQUE  IOHEXOL  300 MG/ML  SOLN COMPARISON:  None Available. FINDINGS: Lower chest: Visualized lung bases are unremarkable. Moderate size sliding-type hiatal hernia. Hepatobiliary: No cholelithiasis or biliary dilatation is noted. 2 small low density abnormalities are noted in right hepatic lobe of uncertain etiology. Pancreas: Unremarkable. No pancreatic ductal dilatation or surrounding inflammatory changes. Spleen: Normal in size without focal abnormality. Adrenals/Urinary Tract: Adrenal glands are unremarkable. Kidneys are normal, without renal calculi, focal lesion, or hydronephrosis. Bladder is unremarkable. Stomach/Bowel: There is no evidence of bowel obstruction or inflammation. The appendix is unremarkable. Vascular/Lymphatic: Aortic atherosclerosis. No enlarged abdominal or pelvic lymph nodes. Reproductive: Status post hysterectomy. No adnexal masses. Other: No ascites or hernia is noted. Musculoskeletal: Grade 2 anterolisthesis of L5-S1 is noted secondary to bilateral L5 spondylolysis. IMPRESSION: Moderate size  sliding-type hiatal hernia. Two small rounded low densities are noted in right hepatic lobe of uncertain etiology. These most likely represent cysts in the absence of any history of malignancy. If the patient does have a history of malignancy, MRI is recommended to evaluate for possible metastatic disease. 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 Aortic Atherosclerosis (ICD10-I70.0). Electronically Signed   By: Rosalene Colon M.D.   On: 03/15/2024 16:40    Procedures Procedures    Medications Ordered in ED Medications  lidocaine  (LIDODERM ) 5 % 1 patch (1 patch Transdermal Patch Applied 03/15/24 1954)  iohexol  (OMNIPAQUE ) 300 MG/ML solution 100 mL (100 mLs Intravenous Contrast Given 03/15/24 1609)  acetaminophen  (TYLENOL ) tablet 650 mg (650 mg Oral Given 03/15/24 1953)  tiZANidine  (ZANAFLEX ) tablet 4 mg (4 mg Oral Given 03/15/24 1953)    ED Course/ Medical Decision Making/ A&P                                 Medical Decision Making Amount and/or Complexity of Data Reviewed Labs: ordered.  Risk OTC drugs. Prescription drug management.   78 year old female with pertinent past medical history of IBS, C-sections, chronic constipation comes in with chief complaint of back pain and no bowel movements in 5 days.  Her back pain is reproducible with palpation in the right iliosacral region.  Patient does not have any hip tenderness.  She has no abdominal tenderness.  She has no UTI-like symptoms.  Differential diagnosis considered for this patient includes ileus, kidney stone, small bowel obstruction, acute cystitis, pyelonephritis.  Basic labs and CT were ordered from triage.  Have independently interpreted patient's CT scan.  There is no clear evidence of air-fluid levels or signs of obstruction.  It does seem that she has moderate stool burden.  We will advised that she continue to take mag citrate for the next 3 days and follow-up with PCP.  Per  radiologist, there is an incidental finding of density in the liver.  Likely liver cyst.  I have discussed this with the patient.  Informed her that she will need to discuss this finding with her PCP so that they can keep an eye on those lesions.  I do not think those liver lesions are related to her acute pain.  Her labs including UA  are reassuring.  She is stable for discharge.  Final Clinical Impression(s) / ED Diagnoses Final diagnoses:  Liver lesion  Musculoskeletal pain  Constipation, unspecified constipation type    Rx / DC Orders ED Discharge Orders          Ordered    magnesium  citrate SOLN   Once        03/15/24 1936    acetaminophen  (TYLENOL  8 HOUR) 650 MG CR tablet  4 times daily        03/15/24 1958    lidocaine  4 %  2 times daily        03/15/24 1958    tiZANidine  (ZANAFLEX ) 4 MG capsule  3 times daily        03/15/24 1958              Deatra Face, MD 03/15/24 2003

## 2024-03-15 NOTE — ED Triage Notes (Signed)
 C/o r sided lower back pain since Friday. Last BM was 5 days ago. Denies fevers.

## 2024-03-15 NOTE — Discharge Instructions (Addendum)
 You were seen in the ER for back pain.  CT scan does not show any evidence of obstruction.  We suspect that you likely do have constipation given that you have not had a normal bowel movement in few days.  Please take the medications prescribed.  Additionally, it appears that your back pain is because of musculoskeletal pain.  Take the medications prescribed for symptom management.  Warm compresses, massage in that area also will be helpful.  If your pain does not get better, then you might need physical therapy therefore it is important that you see your PCP.  Finally, the CT scan in the ER did reveal evidence of liver cyst.  Please follow-up with your primary care doctor to discuss that finding.

## 2024-03-16 ENCOUNTER — Telehealth: Payer: Self-pay

## 2024-03-16 DIAGNOSIS — M7918 Myalgia, other site: Secondary | ICD-10-CM

## 2024-03-16 MED ORDER — TIZANIDINE HCL 4 MG PO TABS: 4.0000 mg | ORAL_TABLET | Freq: Three times a day (TID) | ORAL | 0 refills | Status: DC | PRN

## 2024-03-16 NOTE — Telephone Encounter (Signed)
 Copied from CRM [id number]. Topic: Clinical - Prescription Issue >> Mar 16, 2024  3:14 PM Luane Rumps D wrote: Reason for CRM: Pt was in the ED last night and was prescribed tiZANidine  (ZANAFLEX ) 4 MG capsule. She was told by the pharmacist that her insurance covers the tablet form but not the capsules. She is wondering if the prescription could be changed so her insurance covers it.

## 2024-03-16 NOTE — Transitions of Care (Post Inpatient/ED Visit) (Signed)
 03/16/2024  Name: Patricia Peck MRN: [id number] DOB: Nov 12, 1945  Today's TOC FU Call Status: Today's TOC FU Call Status:: Successful TOC FU Call Completed TOC FU Call Complete Date: 03/16/24 Patient's Name and Date of Birth confirmed.  Transition Care Management Follow-up Telephone Call Date of Discharge: 03/15/24 Discharge Facility: [facility] ([facility]) Type of Discharge: Emergency Department Reason for ED Visit: Other: (liver lesion) How have you been since you were released from the hospital?: Same Any questions or concerns?: No  Items Reviewed: Did you receive and understand the discharge instructions provided?: Yes Medications obtained,verified, and reconciled?: Yes (Medications Reviewed) Any new allergies since your discharge?: No Dietary orders reviewed?: Yes Do you have support at home?: No  Medications Reviewed Today: Medications Reviewed Today     Reviewed by Darrall Ellison, LPN (Licensed Practical Nurse) on 03/16/24 at 1526  Med List Status: <None>   Medication Order Taking? Sig Documenting Provider Last Dose Status Informant  acetaminophen  (TYLENOL  8 HOUR) 650 MG CR tablet [id number]  Take 1 tablet (650 mg total) by mouth 4 (four) times daily. Deatra Face, MD  Active   Alpha-D-Galactosidase (BEANO PO) [id number] No Take 1 tablet by mouth 2 (two) times daily. [provider] Taking Active   amLODipine  (NORVASC ) 10 MG tablet [id number] No Take 1 tablet (10 mg total) by mouth daily. Tania Familia, NP Taking Active   atorvastatin  (LIPITOR) 80 MG tablet [id number] No TAKE 1 TABLET(80 MG) BY MOUTH DAILY Crenshaw, Deannie Fabian, MD Taking Active   Benzocaine -Resorcinol (ANTI-ITCH VAGINAL) 20-3 % CREA [id number] No  [provider] Taking Active   bumetanide  (BUMEX ) 1 MG tablet [id number]  Take 1 tablet (1 mg total) by mouth daily. Kandace Organ, NP  Active   Calcium  Carbonate-Vitamin D  600-400 MG-UNIT tablet [id number] No Take 1 tablet by mouth 2  (two) times daily. [provider] Taking Active Self  Cholecalciferol (VITAMIN D -3) 5000 UNITS TABS [id number] No Take 5,000 Units by mouth 3 (three) times a week. Tuesday Thursday Saturday [provider] Taking Active Self  COMIRNATY syringe [id number] No  [provider] Taking Active   Cranberry 450 MG CAPS [id number] No Take 2 capsules by mouth daily. [provider] Taking Active   denosumab  (PROLIA ) 60 MG/ML SOLN injection [id number] No Inject 60 mg into the skin once. Administer in upper arm, thigh, or abdomen Laveta Pottier, MD Taking Active   esomeprazole  (NEXIUM ) 40 MG capsule [id number]  TAKE 1 CAPSULE BY MOUTH EVERY DAY AT NOON Nche, Connye Delaine, NP  Active   estradiol (ESTRACE) 0.1 MG/GM vaginal cream [id number] No Place 1 Applicatorful vaginally 3 (three) times a week. [provider] Taking Active Self  ezetimibe  (ZETIA ) 10 MG tablet [id number] No Take 1 tablet (10 mg total) by mouth daily. Lenise Quince, MD Taking Active   famotidine  (PEPCID ) 20 MG tablet [id number]  Take 20 mg by mouth at bedtime. [provider]  Active   levothyroxine  (SYNTHROID ) 50 MCG tablet [id number]  Take 1 tablet (50 mcg total) by mouth daily before breakfast. Nche, Connye Delaine, NP  Active   lidocaine  4 % [id number]  Place 1 patch onto the skin 2 (two) times daily. Deatra Face, MD  Active   loratadine  (CLARITIN ) 10 MG tablet [id number]  Take 10 mg by mouth daily. [provider]  Active Self  Meth-Hyo-M Bl-Na Phos-Ph Sal (URO-MP) 118 MG CAPS [id number] No SMARTSIG:2 Capsule(s) By Mouth Morning-Night [provider] Taking Active   metoprolol  succinate (TOPROL -XL) 50  MG 24 hr tablet [id number] No Take 1 tablet (50 mg total) by mouth daily. Lenise Quince, MD Taking Active   montelukast  (SINGULAIR ) 10 MG tablet [id number] No TAKE 1 TABLET(10 MG) BY MOUTH AT BEDTIME Nche, Connye Delaine, NP Taking Active   OVER THE COUNTER MEDICATION  [id number] No Take 2 tablets by mouth daily. Bounty  Hair,Nail, and Skin daily gummies. [provider] Taking Active Self  potassium chloride  (KLOR-CON ) 10 MEQ tablet [id number]  Take 1 tablet (10 mEq total) by mouth daily. Kandace Organ, NP  Active   rOPINIRole  (REQUIP ) 0.25 MG tablet [id number] No TAKE 1 TABLET(0.25 MG) BY MOUTH AT BEDTIME Nche, Connye Delaine, NP Taking Active   temazepam  (RESTORIL ) 7.5 MG capsule [id number]  Take 1 capsule (7.5 mg total) by mouth at bedtime. Nche, Connye Delaine, NP  Active   tiZANidine  (ZANAFLEX ) 4 MG capsule [id number]  Take 1 capsule (4 mg total) by mouth 3 (three) times daily. Deatra Face, MD  Active   trimethoprim  (TRIMPEX ) 100 MG tablet [id number] No Take 1 tablet (100 mg total) by mouth daily. Erman Hayward, MD Taking Active   vitamin E 180 MG (400 UNITS) capsule [id number] No Take 400 Units by mouth 3 (three) times a week. Tuesdays, Thursdays, and Saturdays [provider] Taking Active   XARELTO  15 MG TABS tablet [id number] No TAKE 1 TABLET(15 MG) BY MOUTH DAILY WITH SUPPER Lenise Quince, MD Taking Active             Home Care and Equipment/Supplies: Were Home Health Services Ordered?: NA Any new equipment or medical supplies ordered?: NA  Functional Questionnaire: Do you need assistance with bathing/showering or dressing?: No Do you need assistance with meal preparation?: No Do you need assistance with eating?: No Do you have difficulty maintaining continence: No Do you need assistance with getting out of bed/getting out of a chair/moving?: No Do you have difficulty managing or taking your medications?: No  Follow up appointments reviewed: PCP Follow-up appointment confirmed?: No (sent message to staff) MD Provider Line Number:[phone] Given: No Specialist Hospital Follow-up appointment confirmed?: NA Do you need transportation to your follow-up appointment?: No Do you understand care options if your  condition(s) worsen?: Yes-patient verbalized understanding    SIGNATURE Darrall Ellison, LPN [facility] Nurse Health Advisor Direct Dial [phone]

## 2024-03-16 NOTE — Addendum Note (Signed)
 Addended by: Lovey Rudd on: 03/16/2024 06:47 PM   Modules accepted: Orders

## 2024-03-18 ENCOUNTER — Telehealth: Payer: Self-pay

## 2024-03-18 ENCOUNTER — Telehealth: Payer: Self-pay | Admitting: Nurse Practitioner

## 2024-03-18 NOTE — Telephone Encounter (Signed)
 See note

## 2024-03-18 NOTE — Telephone Encounter (Signed)
 Home blood pressure readings received from patient on dates of May 5th, May 7th and May 9th

## 2024-03-18 NOTE — Telephone Encounter (Signed)
 Copied from CRM [id number]. Topic: General - Other >> Mar 18, 2024  8:05 AM Adonis Hoot wrote: Reason for [id number] called in stating that the hospital is suppose to be sending over  information regarding a liver lesion that she was seen in the ED for. She would like to know if provider has received ?She will also be bringing over a paper for [facility] to look over regarding this as well.

## 2024-03-18 NOTE — Telephone Encounter (Signed)
 See telephone encounter also from 03/18/24

## 2024-03-18 NOTE — Progress Notes (Unsigned)
 Patient's home blood pressure readings and heart rate

## 2024-03-18 NOTE — Telephone Encounter (Signed)
 Called patient and informed her of provider comments. Patient was excited to hear that it was benign. I asked her if she has ever been diagnosed with cancer and what type. She stated has never been diagnosed with cancer. She stated that she wanted you to know that she is having a kidney scan on Wednesday.

## 2024-03-23 ENCOUNTER — Ambulatory Visit: Payer: Self-pay

## 2024-03-23 NOTE — Telephone Encounter (Signed)
  Chief Complaint: body aches Symptoms: body ache, heart burn, weight loss Frequency: weeks Pertinent Negatives: Patient denies sob or chest pain Disposition: [] ED /[] Urgent Care (no appt availability in office) / [x] Appointment(In office/virtual)/ []  [place] [facility]/ [] Home Care/ [] Refused Recommended Disposition /[] [place] [facility]/ []  Follow-up with PCP  Additional Notes: pt states that she is having some lower body pains. States that a month ago her PCP had her change some of her medications. States that last week she was in the ED for lower body pain. Pt states that she has lost weight, her bp is running low, she has constant heart burn and indigestion. BP was 98/66.  States pain wakes her up in the middle of the night. Pt states taking tizanidine  4mg  and acetaminophen  500mg  and using Aspercreme.    Copied from CRM [id number]. Topic: Clinical - Red Word Triage >> Mar 23, 2024 12:56 PM Adaysia C wrote: Red Word that prompted transfer to Nurse Triage: Patient is in severe pain her blood pressure is 98/66 and her oxygen level is low; she is experiencing heart burn and indigestion; patient warm transferred to nurse triage Reason for Disposition  [1] MODERATE pain (e.g., interferes with normal activities) AND [2] present > 3 days  Protocols used: Muscle Aches and Body Pain-A-AH

## 2024-03-23 NOTE — Telephone Encounter (Signed)
 Noted. Pt is scheduled for tomorrow at @ 1:40pm with Dr. Hildy Lowers MD.

## 2024-03-24 ENCOUNTER — Ambulatory Visit (INDEPENDENT_AMBULATORY_CARE_PROVIDER_SITE_OTHER): Admitting: Family Medicine

## 2024-03-24 ENCOUNTER — Encounter: Payer: Self-pay | Admitting: Family Medicine

## 2024-03-24 VITALS — BP 123/83 | HR 81 | Temp 97.0°F | Resp 18 | Wt 114.6 lb

## 2024-03-24 DIAGNOSIS — I959 Hypotension, unspecified: Secondary | ICD-10-CM | POA: Diagnosis not present

## 2024-03-24 DIAGNOSIS — K769 Liver disease, unspecified: Secondary | ICD-10-CM | POA: Diagnosis not present

## 2024-03-24 DIAGNOSIS — K449 Diaphragmatic hernia without obstruction or gangrene: Secondary | ICD-10-CM | POA: Diagnosis not present

## 2024-03-24 DIAGNOSIS — R634 Abnormal weight loss: Secondary | ICD-10-CM | POA: Insufficient documentation

## 2024-03-24 DIAGNOSIS — K5901 Slow transit constipation: Secondary | ICD-10-CM | POA: Diagnosis not present

## 2024-03-24 DIAGNOSIS — K5909 Other constipation: Secondary | ICD-10-CM

## 2024-03-24 DIAGNOSIS — K219 Gastro-esophageal reflux disease without esophagitis: Secondary | ICD-10-CM | POA: Diagnosis not present

## 2024-03-24 HISTORY — DX: Hypotension, unspecified: I95.9

## 2024-03-24 MED ORDER — POLYETHYLENE GLYCOL 3350 17 GM/SCOOP PO POWD
17.0000 g | Freq: Every day | ORAL | 0 refills | Status: DC
Start: 2024-03-24 — End: 2024-07-18

## 2024-03-24 MED ORDER — ESOMEPRAZOLE MAGNESIUM 40 MG PO CPDR: 40.0000 mg | DELAYED_RELEASE_CAPSULE | Freq: Every day | ORAL | 0 refills | Status: DC

## 2024-03-24 MED ORDER — FAMOTIDINE 40 MG PO TABS: 40.0000 mg | ORAL_TABLET | Freq: Every day | ORAL | 0 refills | Status: DC

## 2024-03-24 MED ORDER — SUCRALFATE 1 GM/10ML PO SUSP: 1.0000 g | Freq: Three times a day (TID) | ORAL | 0 refills | Status: DC

## 2024-03-24 NOTE — Progress Notes (Signed)
 Assessment & Plan   Assessment/Plan:     Assessment & Plan Hiatal hernia Moderate-sized hiatal hernia contributing to severe heartburn and indigestion. Current Nexium  20 mg is insufficient. - Increase Nexium  to 40 mg daily, one hour before breakfast. - Increase famotidine  to 40 mg at night. - Prescribe sucralfate for breakthrough heartburn. - Refer to gastroenterology for further evaluation.  Weight loss Weight decreased from 120 lbs to 111 lbs, likely due to decreased oral intake from severe heartburn and abdominal discomfort. - Address gastrointestinal symptoms to improve appetite and intake. - Monitor weight and nutritional status.  Constipation Chronic constipation exacerbated by medication changes and inadequate hydration, causing difficulty with bowel movements and abdominal discomfort. - Recommend Miralax (polyethylene glycol) one capful with 4 ounces of fluid daily. - Encourage increased fluid intake and dietary fiber. - Continue Senna as needed.  Low blood pressure Recent low blood pressure readings potentially related to decreased oral intake and weight loss. Bumex  is currently held. - Continue to hold Bumex . - Monitor blood pressure at home and follow up in two weeks.  Liver lesions Benign appearance.  No history of malignancy.  Unlikely more further imaging.  However will defer to gastroenterology for further evaluation.      Medications Discontinued During This Encounter  Medication Reason   famotidine  (PEPCID ) 20 MG tablet Reorder   esomeprazole  (NEXIUM ) 40 MG capsule Reorder    Return in about 2 weeks (around 04/07/2024) for heartburn, blood pressure, weight check.        Subjective:   Encounter date: 03/24/2024  Patricia Peck is a 78 y.o. female who has Hypothyroidism; Depression; Essential hypertension; PND (post-nasal drip); Esophageal reflux; Osteoporosis; Insomnia; PAF (paroxysmal atrial fibrillation) (HCC); Cerebrovascular disease; IBS  (irritable bowel syndrome); Chronic allergic conjunctivitis; Eustachian tube disorder; Situational anxiety; Hyperlipidemia; History of TIA (transient ischemic attack); Anticoagulated; Stenosis of left subclavian artery (HCC); Left arm pain; Right ovarian cyst; Cystitis; Mild intermittent asthma without complication; Vasomotor rhinitis; Dysuria; Weight loss, unintentional; Liver lesion; Slow transit constipation; Hiatal hernia; and Hypotension on their problem list..   She  has a past medical history of Allergy, Arthritis, Asthma, Atrial fibrillation (HCC), Atrial flutter (HCC), Depression, Dysrhythmia, GERD (gastroesophageal reflux disease), Hyperlipidemia, Hypertension, Hypothyroidism, Osteoporosis, Stroke (HCC) (2017), and TIA (transient ischemic attack)..   She presents with chief complaint of Pain Management ([name] of back pain; pt went to the ED in 03/15/2024 and using muscle relaxer that was prescribed with complaint regarding low blood pressure readings 98/60. ) and Heartburn ([name] of acid reflux after medication frequency change (potassium and Bumex ) with constipation concerns ) .   Discussed the use of AI scribe software for clinical note transcription with the patient, who gave verbal consent to proceed.  History of Present Illness Patricia Peck "Patricia Peck" is a 78 year old female with hypertension who presents with severe stomach pain and constipation.  She has been experiencing severe stomach pain that began after a change in her medication regimen several weeks ago. The pain is intense, causing her to scream when walking, and persisted for two days, leading her to visit the emergency room. She also reports significant heartburn and indigestion, which she describes as 'horrible'.  She stopped taking Bumex  (bumetanide ) and potassium for the past two days due to worsening symptoms, including heartburn and constipation. Her blood pressure readings have been low, with a recent reading of  98/66 mmHg. She has a history of constipation, which has been a problem since having her children.  She has not had a bowel movement since changing her medications, contributing to her discomfort. She has tried using an organic senna supplement at night with tea to alleviate constipation.  She reports a weight loss from 120 pounds to 111 pounds since May 13, attributing this to her inability to eat due to feeling unwell. She experiences dizziness when walking, which she associates with dehydration and inadequate fluid intake.  Her current medications include Nexium  (esomeprazole ) 20 mg, which was recently reduced. She also takes Xarelto  (rivaroxaban ) and has been using famotidine  at night for heartburn relief.  No shortness of breath. Reports lower body pain, runny nose, sneezing, and dizziness.       Past Surgical History:  Procedure Laterality Date   ABDOMINAL HYSTERECTOMY     COLONOSCOPY     MYRINGOTOMY WITH TUBE PLACEMENT Right 01/14/2016   Procedure: RIGHT MYRINGOTOMY WITH T-TUBE PLACEMENT;  Surgeon: Reynold Caves, MD;  Location: [facility];  Service: ENT;  Laterality: Right;   PARTIAL HYSTERECTOMY  1989   ROBOTIC ASSISTED BILATERAL SALPINGO OOPHERECTOMY Bilateral 02/14/2021   Procedure: XI ROBOTIC ASSISTED BILATERAL SALPINGO OOPHORECTOMY;  Surgeon: Alphonso Aschoff, MD;  Location: [facility];  Service: Gynecology;  Laterality: Bilateral;   TEE WITHOUT CARDIOVERSION N/A 12/22/2012   Procedure: TRANSESOPHAGEAL ECHOCARDIOGRAM (TEE);  Surgeon: Lenise Quince, MD;  Location: [facility];  Service: Cardiovascular;  Laterality: N/A;    Outpatient Medications Prior to Visit  Medication Sig Dispense Refill   acetaminophen  (TYLENOL  8 HOUR) 650 MG CR tablet Take 1 tablet (650 mg total) by mouth 4 (four) times daily. 30 tablet 0   Alpha-D-Galactosidase (BEANO PO) Take 1 tablet by mouth 2 (two) times daily.     amLODipine  (NORVASC ) 10 MG tablet Take 1 tablet (10 mg total) by mouth daily. 90  tablet 3   atorvastatin  (LIPITOR) 80 MG tablet TAKE 1 TABLET(80 MG) BY MOUTH DAILY 90 tablet 3   Benzocaine -Resorcinol (ANTI-ITCH VAGINAL) 20-3 % CREA      Calcium  Carbonate-Vitamin D  600-400 MG-UNIT tablet Take 1 tablet by mouth 2 (two) times daily.     Cholecalciferol (VITAMIN D -3) 5000 UNITS TABS Take 5,000 Units by mouth 3 (three) times a week. Tuesday Thursday Saturday     ciprofloxacin  (CIPRO ) 250 MG tablet Take 250 mg by mouth 2 (two) times daily.     clonazePAM  (KLONOPIN ) 0.5 MG tablet Take 0.5 mg by mouth.     COMIRNATY syringe      Cranberry 450 MG CAPS Take 2 capsules by mouth daily.     denosumab  (PROLIA ) 60 MG/ML SOLN injection Inject 60 mg into the skin once. Administer in upper arm, thigh, or abdomen 180 mL 0   estradiol (ESTRACE) 0.1 MG/GM vaginal cream Place 1 Applicatorful vaginally 3 (three) times a week.     ezetimibe  (ZETIA ) 10 MG tablet Take 1 tablet (10 mg total) by mouth daily. 90 tablet 2   levothyroxine  (SYNTHROID ) 50 MCG tablet Take 1 tablet (50 mcg total) by mouth daily before breakfast. 90 tablet 1   lidocaine  4 % Place 1 patch onto the skin 2 (two) times daily. 12 patch 0   loratadine  (CLARITIN ) 10 MG tablet Take 10 mg by mouth daily.     Meth-Hyo-M Bl-Na Phos-Ph Sal (URO-MP) 118 MG CAPS SMARTSIG:2 Capsule(s) By Mouth Morning-Night     metoprolol  succinate (TOPROL -XL) 50 MG 24 hr tablet Take 1 tablet (50 mg total) by mouth daily. 90 tablet 2   montelukast  (SINGULAIR ) 10 MG tablet TAKE 1 TABLET(10  MG) BY MOUTH AT BEDTIME 90 tablet 1   nitrofurantoin  (MACRODANTIN ) 100 MG capsule Take 100 mg by mouth daily.     OVER THE COUNTER MEDICATION Take 2 tablets by mouth daily. Bounty  Hair,Nail, and Skin daily gummies.     PAXLOVID, 150/100, 10 x 150 MG & 10 x 100MG  TBPK Take by mouth.     rOPINIRole  (REQUIP ) 0.25 MG tablet TAKE 1 TABLET(0.25 MG) BY MOUTH AT BEDTIME 90 tablet 3   senna-docusate (SENOKOT-S) 8.6-50 MG tablet Take 1 tablet by mouth daily.     temazepam   (RESTORIL ) 7.5 MG capsule Take 1 capsule (7.5 mg total) by mouth at bedtime. 30 capsule 5   tiZANidine  (ZANAFLEX ) 4 MG tablet Take 1 tablet (4 mg total) by mouth every 8 (eight) hours as needed for muscle spasms. 9 tablet 0   trimethoprim  (TRIMPEX ) 100 MG tablet Take 1 tablet (100 mg total) by mouth daily. 30 tablet 11   vitamin E 180 MG (400 UNITS) capsule Take 400 Units by mouth 3 (three) times a week. Tuesdays, Thursdays, and Saturdays     XARELTO  15 MG TABS tablet TAKE 1 TABLET(15 MG) BY MOUTH DAILY WITH SUPPER 90 tablet 1   esomeprazole  (NEXIUM ) 40 MG capsule TAKE 1 CAPSULE BY MOUTH EVERY DAY AT NOON 90 capsule 0   famotidine  (PEPCID ) 20 MG tablet Take 20 mg by mouth at bedtime.     bumetanide  (BUMEX ) 1 MG tablet Take 1 tablet (1 mg total) by mouth daily. (Patient not taking: Reported on 03/24/2024) 90 tablet 0   potassium chloride  (KLOR-CON ) 10 MEQ tablet Take 1 tablet (10 mEq total) by mouth daily. (Patient not taking: Reported on 03/24/2024) 90 tablet 0   No facility-administered medications prior to visit.    Family History  Problem Relation Age of Onset   Breast cancer Mother    Drug abuse Son    Depression Son    Stroke Paternal Aunt    Heart disease Paternal Grandfather    Diabetes Maternal Grandfather    Colon cancer Neg Hx    Ovarian cancer Neg Hx    Endometrial cancer Neg Hx    Pancreatic cancer Neg Hx    Prostate cancer Neg Hx     Social History   Socioeconomic History   Marital status: Widowed    Spouse name: Not on file   Number of children: 2   Years of education: Not on file   Highest education level: Some college, no degree  Occupational History    Employer: RETIRED  Tobacco Use   Smoking status: Never    Passive exposure: Never   Smokeless tobacco: Never  Vaping Use   Vaping status: Never Used  Substance and Sexual Activity   Alcohol use: Not Currently    Comment: Rare   Drug use: No   Sexual activity: Not Currently  Other Topics Concern   Not on  file  Social History Narrative   Does have HPOA- sons Donavon Fudge and Asa Lauth.   Does desire life support if not futile.   Not sure about feeding tubes.   Social Drivers of Corporate investment banker Strain: Low Risk  (11/26/2023)   Overall Financial Resource Strain (CARDIA)    Difficulty of Paying Living Expenses: Not hard at all  Food Insecurity: No Food Insecurity (11/26/2023)   Hunger Vital Sign    Worried About Running Out of Food in the Last Year: Never true    Ran Out of Food in the Last  Year: Never true  Transportation Needs: No Transportation Needs (11/26/2023)   PRAPARE - Administrator, Civil Service (Medical): No    Lack of Transportation (Non-Medical): No  Physical Activity: Insufficiently Active (11/26/2023)   Exercise Vital Sign    Days of Exercise per Week: 4 days    Minutes of Exercise per Session: 30 min  Stress: Stress Concern Present (11/26/2023)   [employer] of Occupational Health - Occupational Stress Questionnaire    Feeling of Stress : To some extent  Social Connections: Moderately Integrated (11/26/2023)   Social Connection and Isolation Panel [NHANES]    Frequency of Communication with Friends and Family: More than three times a week    Frequency of Social Gatherings with Friends and Family: Once a week    Attends Religious Services: 1 to 4 times per year    Active Member of [employer] or Organizations: Yes    Attends Banker Meetings: More than 4 times per year    Marital Status: Widowed  Intimate Partner Violence: Not At Risk (10/19/2023)   Humiliation, Afraid, Rape, and Kick questionnaire    Fear of Current or Ex-Partner: No    Emotionally Abused: No    Physically Abused: No    Sexually Abused: No                                                                                                  Objective:  Physical Exam: BP 123/83 (BP Location: Left Arm, Patient Position: Sitting, Cuff Size: Large)   Pulse 81   Temp (!) 97 F (36.1  C) (Temporal)   Resp 18   Wt 114 lb 9.6 oz (52 kg)   SpO2 100%   BMI 19.67 kg/m   Wt Readings from Last 3 Encounters:  03/24/24 114 lb 9.6 oz (52 kg)  11/30/23 124 lb 12.8 oz (56.6 kg)  11/13/23 124 lb 3.2 oz (56.3 kg)    Physical Exam MEASUREMENTS: Weight- 111. GENERAL: Alert, cooperative, well developed, no acute distress. HEENT: Normocephalic, normal oropharynx, moist mucous membranes. CHEST: Clear to auscultation bilaterally, no wheezes, rhonchi, or crackles. CARDIOVASCULAR: Normal heart rate and rhythm, S1 and S2 normal without murmurs. ABDOMEN: Soft, non-distended, without organomegaly, normal bowel sounds. Lower abdomen tender. EXTREMITIES: No cyanosis or edema. NEUROLOGICAL: Cranial nerves grossly intact, moves all extremities without gross motor or sensory deficit.   Physical Exam  CT ABDOMEN PELVIS W CONTRAST Result Date: 03/15/2024 CLINICAL DATA:  Acute right lower quadrant abdominal pain. EXAM: CT ABDOMEN AND PELVIS WITH CONTRAST TECHNIQUE: Multidetector CT imaging of the abdomen and pelvis was performed using the standard protocol following bolus administration of intravenous contrast. RADIATION DOSE REDUCTION: This exam was performed according to the departmental dose-optimization program which includes automated exposure control, adjustment of the mA and/or kV according to patient size and/or use of iterative reconstruction technique. CONTRAST:  OMNIPAQUE  IOHEXOL  300 MG/ML  SOLN COMPARISON:  None Available. FINDINGS: Lower chest: Visualized lung bases are unremarkable. Moderate size sliding-type hiatal hernia. Hepatobiliary: No cholelithiasis or biliary dilatation is noted. 2 small low density abnormalities are noted in  right hepatic lobe of uncertain etiology. Pancreas: Unremarkable. No pancreatic ductal dilatation or surrounding inflammatory changes. Spleen: Normal in size without focal abnormality. Adrenals/Urinary Tract: Adrenal glands are unremarkable. Kidneys are  normal, without renal calculi, focal lesion, or hydronephrosis. Bladder is unremarkable. Stomach/Bowel: There is no evidence of bowel obstruction or inflammation. The appendix is unremarkable. Vascular/Lymphatic: Aortic atherosclerosis. No enlarged abdominal or pelvic lymph nodes. Reproductive: Status post hysterectomy. No adnexal masses. Other: No ascites or hernia is noted. Musculoskeletal: Grade 2 anterolisthesis of L5-S1 is noted secondary to bilateral L5 spondylolysis. IMPRESSION: Moderate size sliding-type hiatal hernia. Two small rounded low densities are noted in right hepatic lobe of uncertain etiology. These most likely represent cysts in the absence of any history of malignancy. If the patient does have a history of malignancy, MRI is recommended to evaluate for possible metastatic disease. 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 Aortic Atherosclerosis (ICD10-I70.0). Electronically Signed   By: Rosalene Colon M.D.   On: 03/15/2024 16:40   DG Bone Density Result Date: 02/10/2024 Table formatting from the original result was not included. Date of study: 02/09/2024 Exam: DUAL X-RAY ABSORPTIOMETRY (DXA) FOR BONE MINERAL DENSITY (BMD) Instrument: [employer] Requesting Provider: PCP Indication: follow up for osteoporosis Comparison: 04/04/2020 Clinical data: Pt is a 78 y.o. female without previous history of fracture. On calcium  and vitamin D . Results:  Lumbar spine L1-L4 Femoral neck (FN) 33% distal radius T-score -0.4 RFN: -1.2 LFN: -1.6 n/a Change in BMD from previous DXA test (%) -1.4% -1.1% n/a (*) statistically significant Assessment: the BMD is low according to the [facility] classification for osteoporosis (see below). Fracture risk: moderate-high FRAX score: 10 year major osteoporotic risk: 11.9%. 10 year hip fracture risk: 3.0%. The thresholds for treatment are 20% and 3%, respectively. Comments: the technical quality of the study is good. Evaluation for secondary  causes should be considered if clinically indicated. Recommend optimizing calcium  (1200 mg/day) and vitamin D  (800 IU/day) intake. Followup: Repeat BMD is appropriate after 2 years or after 1-2 years if starting treatment. WHO criteria for diagnosis of osteoporosis in postmenopausal women and in men 90 y/o or older: - normal: T-score -1.0 to + 1.0 - osteopenia/low bone density: T-score between -2.5 and -1.0 - osteoporosis: T-score below -2.5 - severe osteoporosis: T-score below -2.5 with history of fragility fracture Note: although not part of the WHO classification, the presence of a fragility fracture, regardless of the T-score, should be considered diagnostic of osteoporosis, provided other causes for the fracture have been excluded. Treatment: The National Osteoporosis Foundation recommends that treatment be considered in postmenopausal women and men age 79 or older with: 1. Hip or vertebral (clinical or morphometric) fracture 2. T-score of - 2.5 or lower at the spine or hip 3. 10-year fracture probability by FRAX of at least 20% for a major osteoporotic fracture and 3% for a hip fracture Emilie Harden, MD [facility]    Recent Results (from the past 2160 hours)  Renal Function Panel     Status: Abnormal   Collection Time: 03/01/24 10:01 AM  Result Value Ref Range   Sodium 140 135 - 145 mEq/L   Potassium 4.4 3.5 - 5.1 mEq/L   Chloride 103 96 - 112 mEq/L   CO2 28 19 - 32 mEq/L   Albumin 4.6 3.5 - 5.2 g/dL   BUN 16 6 - 23 mg/dL   Creatinine, Ser 3.47 0.40 - 1.20 mg/dL   Glucose, Bld 93 70 - 99 mg/dL  Phosphorus 3.4 2.3 - 4.6 mg/dL   GFR 16.10 (L) >96.04 mL/min    Comment: Calculated using the CKD-EPI Creatinine Equation (2021)   Calcium  9.7 8.4 - 10.5 mg/dL  Lipase, blood     Status: None   Collection Time: 03/15/24  1:47 PM  Result Value Ref Range   Lipase 32 11 - 51 U/L    Comment: Performed at [facility], [address] [place], [place] [address]   Comprehensive metabolic panel     Status: Abnormal   Collection Time: 03/15/24  1:47 PM  Result Value Ref Range   Sodium 135 135 - 145 mmol/L   Potassium 3.9 3.5 - 5.1 mmol/L    Comment: HEMOLYSIS AT THIS LEVEL MAY AFFECT RESULT   Chloride 100 98 - 111 mmol/L   CO2 19 (L) 22 - 32 mmol/L   Glucose, Bld 118 (H) 70 - 99 mg/dL    Comment: Glucose reference range applies only to samples taken after fasting for at least 8 hours.   BUN 13 8 - 23 mg/dL   Creatinine, Ser 1.19 0.44 - 1.00 mg/dL   Calcium  8.9 8.9 - 10.3 mg/dL   Total Protein 7.4 6.5 - 8.1 g/dL   Albumin 4.4 3.5 - 5.0 g/dL   AST 28 15 - 41 U/L    Comment: HEMOLYSIS AT THIS LEVEL MAY AFFECT RESULT   ALT 17 0 - 44 U/L   Alkaline Phosphatase 97 38 - 126 U/L   Total Bilirubin 0.6 0.0 - 1.2 mg/dL   GFR, Estimated 58 (L) >60 mL/min    Comment: (NOTE) Calculated using the CKD-EPI Creatinine Equation (2021)    Anion gap 16 (H) 5 - 15    Comment: Performed at [facility], [address], [place], [place] [address]  CBC     Status: Abnormal   Collection Time: 03/15/24  1:47 PM  Result Value Ref Range   WBC 10.6 (H) 4.0 - 10.5 K/uL   RBC 4.81 3.87 - 5.11 MIL/uL   Hemoglobin 14.0 12.0 - 15.0 g/dL   HCT 95.6 21.3 - 08.6 %   MCV 87.3 80.0 - 100.0 fL   MCH 29.1 26.0 - 34.0 pg   MCHC 33.3 30.0 - 36.0 g/dL   RDW 57.8 46.9 - 62.9 %   Platelets 226 150 - 400 K/uL   nRBC 0.0 0.0 - 0.2 %    Comment: Performed at [facility], [address] [place], [place] [address]  Urinalysis, Routine w reflex microscopic -Urine, Clean Catch     Status: None   Collection Time: 03/15/24  1:47 PM  Result Value Ref Range   Color, Urine YELLOW YELLOW   APPearance CLEAR CLEAR   Specific Gravity, Urine 1.012 1.005 - 1.030   pH 7.0 5.0 - 8.0   Glucose, UA NEGATIVE NEGATIVE mg/dL   Hgb urine dipstick NEGATIVE NEGATIVE   Bilirubin Urine NEGATIVE NEGATIVE   Ketones, ur NEGATIVE NEGATIVE mg/dL   Protein, ur  NEGATIVE NEGATIVE mg/dL   Nitrite NEGATIVE NEGATIVE   Leukocytes,Ua NEGATIVE NEGATIVE    Comment: Performed at [facility], [address], [place], [place] [address]        Carnell Christian, MD, MS

## 2024-03-24 NOTE — Patient Instructions (Signed)
  VISIT SUMMARY: Today, we discussed your severe stomach pain, constipation, and related symptoms. You have been experiencing intense stomach pain, heartburn, and indigestion, which worsened after a change in your medication. You also reported significant weight loss and low blood pressure. We have made several adjustments to your medications and provided recommendations to help manage your symptoms.  YOUR PLAN: -HIATAL HERNIA: A hiatal hernia occurs when part of the stomach pushes up through the diaphragm, causing heartburn and indigestion. We have increased your Nexium  to 40 mg daily and your famotidine  to 40 mg at night. Additionally, we prescribed sucralfate for any breakthrough heartburn and referred you to a gastroenterologist for further evaluation.  -WEIGHT LOSS: Your weight loss is likely due to decreased food intake from severe heartburn and abdominal discomfort. We will address your gastrointestinal symptoms to help improve your appetite and monitor your weight and nutritional status.  -CONSTIPATION: Constipation is difficulty in having bowel movements, which can cause abdominal discomfort. We recommend taking Miralax daily, increasing your fluid intake, and continuing to use Senna as needed.  -LOW BLOOD PRESSURE: Your recent low blood pressure readings may be related to decreased food and fluid intake. We will continue to hold your Bumex  and ask you to monitor your blood pressure at home. Please follow up in two weeks.  INSTRUCTIONS: Please follow up in two weeks to monitor your blood pressure and overall progress. Additionally, you have been referred to a gastroenterologist for further evaluation of your hiatal hernia.

## 2024-04-12 ENCOUNTER — Encounter: Payer: Self-pay | Admitting: Internal Medicine

## 2024-04-12 ENCOUNTER — Ambulatory Visit: Admitting: Internal Medicine

## 2024-04-12 VITALS — BP 106/64 | HR 69 | Temp 97.6°F | Ht 64.0 in | Wt 116.0 lb

## 2024-04-12 DIAGNOSIS — R634 Abnormal weight loss: Secondary | ICD-10-CM | POA: Diagnosis not present

## 2024-04-12 DIAGNOSIS — I959 Hypotension, unspecified: Secondary | ICD-10-CM | POA: Diagnosis not present

## 2024-04-12 DIAGNOSIS — I10 Essential (primary) hypertension: Secondary | ICD-10-CM

## 2024-04-12 DIAGNOSIS — M4317 Spondylolisthesis, lumbosacral region: Secondary | ICD-10-CM | POA: Diagnosis not present

## 2024-04-12 DIAGNOSIS — M4306 Spondylolysis, lumbar region: Secondary | ICD-10-CM

## 2024-04-12 DIAGNOSIS — K219 Gastro-esophageal reflux disease without esophagitis: Secondary | ICD-10-CM

## 2024-04-12 NOTE — Patient Instructions (Addendum)
 Blood Pressure: STOP taking Bumex   Continue taking the amlodipine  and Metoprolol  for your blood pressure.  Keep a log of your blood pressure at home.     Back pain:  Lidocaine  patches as needed  Tylenol  325mg : take 1-2 tablets every 8 hours as needed for pain  Heat pad    Acid Reflux:  Continue Pepcid  (Famotidine ) 40mg  once daily  Can use Nexium  as needed for worsening reflux  Can continue to use Carafate  if needed for reflux

## 2024-04-12 NOTE — Progress Notes (Signed)
 [facility] [address] [place] [address] Dept: [phone] Dept Fax: [phone]    Subjective:   Patricia Peck 1946/05/16 04/12/2024  Chief Complaint  Patient presents with   Follow-up    Discuss medication,  lab work     HPI: Patricia Peck presents today for re-assessment and management of chronic medical conditions.  Discussed the use of AI scribe software for clinical note transcription with the patient, who gave verbal consent to proceed.  History of Present Illness   Patricia Peck "Patricia Peck" is a 78 year old female with hypertension and acid reflux who presents for follow-up of blood pressure and gastrointestinal symptoms.  Her acid reflux symptoms have significantly improved since her last visit two weeks ago. She increased her famotidine  to 40 mg. She was also given Carafate  to use as needed. She was supposed to increase her Nexium  40mg , but patient never started this as she thought she was receiving a new Rx instead of taking OTC. These adjustments in Pepcid  and carafate  have helped alleviate her symptoms. She lost weight from 122lbs to 111lbs at prior visit due to worsening reflux. She has now gained 5 pounds since her last visit, now weighing 116 pounds, up from 111 pounds.She has a previous diagnosis of a hiatal hernia discovered during an ER visit, for which she is awaiting a GI consultation.  She has a history of high blood pressure, which she notes is hereditary. Her blood pressure readings at home have been stable, with recent measurements including 132/74, 103/69, 121/77, and 107/69. She is currently taking amlodipine  10 mg and metoprolol  50 mg daily. Previously, she was taking Bumex , which was reduced and then stopped.   She experiences back pain, which she attributes to arthritis. 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 She uses lidocaine  patches as needed for pain management, and finds these treatments helpful.   She tries to exercise regularly to manage her health conditions. No current abdominal pain.      Home BP log: 132/74, 103/69, 121/77, 107/69   The following portions of the patient's history were reviewed and updated as appropriate: past medical history, past surgical history, family history, social history, allergies, medications, and problem list.   Patient Active Problem List   Diagnosis Date Noted   Acquired spondylolysis of lumbar spine 04/12/2024   Weight loss, unintentional 03/24/2024   Liver lesion 03/24/2024   Slow transit constipation 03/24/2024   Hiatal hernia 03/24/2024   Hypotension 03/24/2024   Vasomotor rhinitis 02/24/2022   Dysuria 02/24/2022   Mild intermittent asthma without complication 07/16/2021   Cystitis 02/14/2021   Right ovarian cyst 02/07/2021   Left arm pain 11/22/2019   Hyperlipidemia 09/17/2019   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/2019   History of TIA (transient ischemic attack) 08/01/2019   Anticoagulated 08/01/2019   Situational anxiety 05/23/2019   Eustachian tube disorder 04/12/2014   Chronic allergic conjunctivitis 08/24/2013   IBS (irritable bowel syndrome) 04/11/2013   PAF (paroxysmal atrial fibrillation) (HCC) 01/27/2013   Cerebrovascular disease 01/27/2013   Insomnia 11/02/2012   Esophageal reflux 03/12/2010   Hypothyroidism 04/14/2007   Depression 04/14/2007   Essential hypertension 04/14/2007   PND (post-nasal drip) 04/14/2007   Osteoporosis 04/14/2007   Past Medical History:  Diagnosis Date   Allergy    Arthritis    hands   Asthma    uses  an inhaler   Atrial fibrillation (HCC)    Atrial flutter (HCC)    Depression    Dysrhythmia    a-fib, on Xarelto    GERD (gastroesophageal reflux disease)    Hyperlipidemia    Hypertension    Hypothyroidism    Osteoporosis    Stroke (HCC) 2017   TIA   TIA  (transient ischemic attack)    Past Surgical History:  Procedure Laterality Date   ABDOMINAL HYSTERECTOMY     COLONOSCOPY     MYRINGOTOMY WITH TUBE PLACEMENT Right 01/14/2016   Procedure: RIGHT MYRINGOTOMY WITH T-TUBE PLACEMENT;  Surgeon: Reynold Caves, MD;  Location: [facility];  Service: ENT;  Laterality: Right;   PARTIAL HYSTERECTOMY  1989   ROBOTIC ASSISTED BILATERAL SALPINGO OOPHERECTOMY Bilateral 02/14/2021   Procedure: XI ROBOTIC ASSISTED BILATERAL SALPINGO OOPHORECTOMY;  Surgeon: Alphonso Aschoff, MD;  Location: [facility];  Service: Gynecology;  Laterality: Bilateral;   TEE WITHOUT CARDIOVERSION N/A 12/22/2012   Procedure: TRANSESOPHAGEAL ECHOCARDIOGRAM (TEE);  Surgeon: Lenise Quince, MD;  Location: [facility];  Service: Cardiovascular;  Laterality: N/A;   Family History  Problem Relation Age of Onset   Breast cancer Mother    Drug abuse Son    Depression Son    Stroke Paternal Aunt    Heart disease Paternal Grandfather    Diabetes Maternal Grandfather    Colon cancer Neg Hx    Ovarian cancer Neg Hx    Endometrial cancer Neg Hx    Pancreatic cancer Neg Hx    Prostate cancer Neg Hx     Current Outpatient Medications:    acetaminophen  (TYLENOL  8 HOUR) 650 MG CR tablet, Take 1 tablet (650 mg total) by mouth 4 (four) times daily., Disp: 30 tablet, Rfl: 0   Alpha-D-Galactosidase (BEANO PO), Take 1 tablet by mouth 2 (two) times daily., Disp: , Rfl:    amLODipine  (NORVASC ) 10 MG tablet, Take 1 tablet (10 mg total) by mouth daily., Disp: 90 tablet, Rfl: 3   atorvastatin  (LIPITOR) 80 MG tablet, TAKE 1 TABLET(80 MG) BY MOUTH DAILY, Disp: 90 tablet, Rfl: 3   Benzocaine -Resorcinol (ANTI-ITCH VAGINAL) 20-3 % CREA, , Disp: , Rfl:    Calcium  Carbonate-Vitamin D  600-400 MG-UNIT tablet, Take 1 tablet by mouth 2 (two) times daily., Disp: , Rfl:    Cholecalciferol (VITAMIN D -3) 5000 UNITS TABS, Take 5,000 Units by mouth 3 (three) times a week. Tuesday Thursday Saturday, Disp: , Rfl:     clonazePAM  (KLONOPIN ) 0.5 MG tablet, Take 0.5 mg by mouth., Disp: , Rfl:    Cranberry 450 MG CAPS, Take 2 capsules by mouth daily., Disp: , Rfl:    denosumab  (PROLIA ) 60 MG/ML SOLN injection, Inject 60 mg into the skin once. Administer in upper arm, thigh, or abdomen, Disp: 180 mL, Rfl: 0   esomeprazole  (NEXIUM ) 40 MG capsule, Take 1 capsule (40 mg total) by mouth daily before breakfast. Take 1 hour before eating., Disp: 90 capsule, Rfl: 0   estradiol (ESTRACE) 0.1 MG/GM vaginal cream, Place 1 Applicatorful vaginally 3 (three) times a week., Disp: , Rfl:    ezetimibe  (ZETIA ) 10 MG tablet, Take 1 tablet (10 mg total) by mouth daily., Disp: 90 tablet, Rfl: 2   famotidine  (PEPCID ) 40 MG tablet, Take 1 tablet (40 mg total) by mouth at bedtime., Disp: 90 tablet, Rfl: 0   levothyroxine  (SYNTHROID ) 50 MCG tablet, Take 1 tablet (50 mcg total) by mouth daily before breakfast., Disp: 90 tablet, Rfl: 1   lidocaine  4 %, Place  1 patch onto the skin 2 (two) times daily., Disp: 12 patch, Rfl: 0   loratadine  (CLARITIN ) 10 MG tablet, Take 10 mg by mouth daily., Disp: , Rfl:    metoprolol  succinate (TOPROL -XL) 50 MG 24 hr tablet, Take 1 tablet (50 mg total) by mouth daily., Disp: 90 tablet, Rfl: 2   montelukast  (SINGULAIR ) 10 MG tablet, TAKE 1 TABLET(10 MG) BY MOUTH AT BEDTIME, Disp: 90 tablet, Rfl: 1   nitrofurantoin  (MACRODANTIN ) 100 MG capsule, Take 100 mg by mouth daily., Disp: , Rfl:    polyethylene glycol powder (GLYCOLAX /MIRALAX ) 17 GM/SCOOP powder, Take 17 g by mouth daily., Disp: 500 g, Rfl: 0   potassium chloride  (KLOR-CON ) 10 MEQ tablet, Take 1 tablet (10 mEq total) by mouth daily., Disp: 90 tablet, Rfl: 0   sucralfate  (CARAFATE ) 1 GM/10ML suspension, Take 10 mLs (1 g total) by mouth 4 (four) times daily -  with meals and at bedtime., Disp: 420 mL, Rfl: 0   trimethoprim  (TRIMPEX ) 100 MG tablet, Take 1 tablet (100 mg total) by mouth daily., Disp: 30 tablet, Rfl: 11   vitamin E 180 MG (400 UNITS) capsule,  Take 400 Units by mouth 3 (three) times a week. Tuesdays, Thursdays, and Saturdays, Disp: , Rfl:    XARELTO  15 MG TABS tablet, TAKE 1 TABLET(15 MG) BY MOUTH DAILY WITH SUPPER, Disp: 90 tablet, Rfl: 1   ciprofloxacin  (CIPRO ) 250 MG tablet, Take 250 mg by mouth 2 (two) times daily. (Patient not taking: Reported on 04/12/2024), Disp: , Rfl:    COMIRNATY syringe, , Disp: , Rfl:    Meth-Hyo-M Bl-Na Phos-Ph Sal (URO-MP) 118 MG CAPS, SMARTSIG:2 Capsule(s) By Mouth Morning-Night, Disp: , Rfl:    OVER THE COUNTER MEDICATION, Take 2 tablets by mouth daily. Bounty  Hair,Nail, and Skin daily gummies., Disp: , Rfl:    PAXLOVID, 150/100, 10 x 150 MG & 10 x 100MG  TBPK, Take by mouth., Disp: , Rfl:    rOPINIRole  (REQUIP ) 0.25 MG tablet, TAKE 1 TABLET(0.25 MG) BY MOUTH AT BEDTIME (Patient not taking: Reported on 04/12/2024), Disp: 90 tablet, Rfl: 3   senna-docusate (SENOKOT-S) 8.6-50 MG tablet, Take 1 tablet by mouth daily., Disp: , Rfl:    temazepam  (RESTORIL ) 7.5 MG capsule, Take 1 capsule (7.5 mg total) by mouth at bedtime., Disp: 30 capsule, Rfl: 5   tiZANidine  (ZANAFLEX ) 4 MG tablet, Take 1 tablet (4 mg total) by mouth every 8 (eight) hours as needed for muscle spasms. (Patient not taking: Reported on 04/12/2024), Disp: 9 tablet, Rfl: 0 No Known Allergies   ROS: A complete ROS was performed with pertinent positives/negatives noted in the HPI. The remainder of the ROS are negative.    Objective:   Today's Vitals   04/12/24 1027  BP: 106/64  Pulse: 69  Temp: 97.6 F (36.4 C)  TempSrc: Temporal  SpO2: 99%  Weight: 116 lb (52.6 kg)  Height: 5\' 4"  (1.626 m)    GENERAL: Well-appearing, in NAD. Well nourished.  SKIN: Pink, warm and dry. No rash, lesion, ulceration, or ecchymoses.  NECK: Trachea midline. Full ROM w/o pain or tenderness. No lymphadenopathy.  RESPIRATORY: Chest wall symmetrical. Respirations even and non-labored. Breath sounds clear to auscultation bilaterally.  CARDIAC: S1, S2 present,  regular rate and rhythm. Peripheral pulses 2+ bilaterally.  MSK: Muscle tone and strength appropriate for age.  EXTREMITIES: Without clubbing, cyanosis, or edema.  NEUROLOGIC: No motor or sensory deficits. Steady, even gait.  PSYCH/MENTAL STATUS: Alert, oriented x 3. Cooperative, appropriate mood and affect.  There are no preventive care reminders to display for this patient.   No results found for any visits on 04/12/24.  The ASCVD Risk score (Arnett DK, et al., 2019) failed to calculate for the following reasons:   Risk score cannot be calculated because patient has a medical history suggesting prior/existing ASCVD     Assessment & Plan:  1. Hypotension, unspecified hypotension type (Primary) - resolved  2. Weight loss, unintentional - improving, has gained 5lbs from prior visit  3. Gastroesophageal reflux disease without esophagitis - much improved  - continue Famotidine  40mg  PO daily  - Can take Nexium  and Carafate  PRN for breakthrough or worsening GERD  4. Anterolisthesis of lumbosacral spine - Tylenol  325mg  1-2 tablets q8hr PRN  - Heating pad  - Lidocaine  patches as needed   5. Acquired spondylolysis of lumbar spine - Tylenol  325mg  1-2 tablets q8hr PRN  - Heating pad  - Lidocaine  patches as needed   6. Essential hypertension - well controlled. Continue Amlodipine  10mg  Po daily and Metoprolol  25mg  PO daily  - D/c Bumex  as BP's remain stable without medication  No orders of the defined types were placed in this encounter.  No images are attached to the encounter or orders placed in the encounter. No orders of the defined types were placed in this encounter.   Return for Scheduled Routine Office Visits and as needed.   Gavin Kast, FNP

## 2024-04-18 ENCOUNTER — Ambulatory Visit: Admitting: Nurse Practitioner

## 2024-04-25 ENCOUNTER — Ambulatory Visit: Admitting: Physician Assistant

## 2024-05-05 ENCOUNTER — Encounter: Payer: Self-pay | Admitting: Nurse Practitioner

## 2024-05-05 ENCOUNTER — Other Ambulatory Visit: Payer: Self-pay | Admitting: Nurse Practitioner

## 2024-05-05 ENCOUNTER — Ambulatory Visit: Admitting: Nurse Practitioner

## 2024-05-05 VITALS — BP 126/74 | HR 68 | Temp 97.5°F | Ht 64.0 in | Wt 119.2 lb

## 2024-05-05 DIAGNOSIS — K5901 Slow transit constipation: Secondary | ICD-10-CM

## 2024-05-05 DIAGNOSIS — E785 Hyperlipidemia, unspecified: Secondary | ICD-10-CM | POA: Diagnosis not present

## 2024-05-05 DIAGNOSIS — J4530 Mild persistent asthma, uncomplicated: Secondary | ICD-10-CM | POA: Insufficient documentation

## 2024-05-05 DIAGNOSIS — R600 Localized edema: Secondary | ICD-10-CM | POA: Diagnosis not present

## 2024-05-05 DIAGNOSIS — D72829 Elevated white blood cell count, unspecified: Secondary | ICD-10-CM

## 2024-05-05 DIAGNOSIS — E038 Other specified hypothyroidism: Secondary | ICD-10-CM

## 2024-05-05 DIAGNOSIS — I10 Essential (primary) hypertension: Secondary | ICD-10-CM | POA: Diagnosis not present

## 2024-05-05 DIAGNOSIS — K219 Gastro-esophageal reflux disease without esophagitis: Secondary | ICD-10-CM

## 2024-05-05 DIAGNOSIS — T502X5A Adverse effect of carbonic-anhydrase inhibitors, benzothiadiazides and other diuretics, initial encounter: Secondary | ICD-10-CM

## 2024-05-05 DIAGNOSIS — E876 Hypokalemia: Secondary | ICD-10-CM | POA: Diagnosis not present

## 2024-05-05 LAB — CBC WITH DIFFERENTIAL/PLATELET
Basophils Absolute: 0.1 10*3/uL (ref 0.0–0.1)
Basophils Relative: 1 % (ref 0.0–3.0)
Eosinophils Absolute: 0.3 10*3/uL (ref 0.0–0.7)
Eosinophils Relative: 2.9 % (ref 0.0–5.0)
HCT: 38.5 % (ref 36.0–46.0)
Hemoglobin: 13.1 g/dL (ref 12.0–15.0)
Lymphocytes Relative: 23.9 % (ref 12.0–46.0)
Lymphs Abs: 2.2 10*3/uL (ref 0.7–4.0)
MCHC: 34 g/dL (ref 30.0–36.0)
MCV: 87.5 fl (ref 78.0–100.0)
Monocytes Absolute: 1 10*3/uL (ref 0.1–1.0)
Monocytes Relative: 11.4 % (ref 3.0–12.0)
Neutro Abs: 5.6 10*3/uL (ref 1.4–7.7)
Neutrophils Relative %: 60.8 % (ref 43.0–77.0)
Platelets: 371 10*3/uL (ref 150.0–400.0)
RBC: 4.4 Mil/uL (ref 3.87–5.11)
RDW: 14.1 % (ref 11.5–15.5)
WBC: 9.2 10*3/uL (ref 4.0–10.5)

## 2024-05-05 LAB — RENAL FUNCTION PANEL
Albumin: 4.9 g/dL (ref 3.5–5.2)
BUN: 21 mg/dL (ref 6–23)
CO2: 26 meq/L (ref 19–32)
Calcium: 9.7 mg/dL (ref 8.4–10.5)
Chloride: 104 meq/L (ref 96–112)
Creatinine, Ser: 1.32 mg/dL — ABNORMAL HIGH (ref 0.40–1.20)
GFR: 38.57 mL/min — ABNORMAL LOW (ref >60.00)
Glucose, Bld: 104 mg/dL — ABNORMAL HIGH (ref 70–99)
Phosphorus: 3.2 mg/dL (ref 2.3–4.6)
Potassium: 3.9 meq/L (ref 3.5–5.1)
Sodium: 138 meq/L (ref 135–145)

## 2024-05-05 LAB — T4, FREE: Free T4: 0.83 ng/dL (ref 0.60–1.60)

## 2024-05-05 LAB — TSH: TSH: 1.33 u[IU]/mL (ref 0.35–5.50)

## 2024-05-05 MED ORDER — PANTOPRAZOLE SODIUM 40 MG PO TBEC
40.0000 mg | DELAYED_RELEASE_TABLET | Freq: Two times a day (BID) | ORAL | 5 refills | Status: DC
Start: 2024-05-05 — End: 2024-08-24

## 2024-05-05 MED ORDER — AMLODIPINE BESYLATE 5 MG PO TABS: 5.0000 mg | ORAL_TABLET | Freq: Every evening | ORAL | 1 refills | Status: DC

## 2024-05-05 MED ORDER — POTASSIUM CHLORIDE ER 10 MEQ PO TBCR: 10.0000 meq | EXTENDED_RELEASE_TABLET | Freq: Every day | ORAL | 0 refills | Status: AC | PRN

## 2024-05-05 MED ORDER — BUMETANIDE 1 MG PO TABS: 1.0000 mg | ORAL_TABLET | Freq: Every day | ORAL | 5 refills | Status: AC | PRN

## 2024-05-05 NOTE — Patient Instructions (Addendum)
 Maintain metoprolol  dose Decrease amlodipine  dose to 5mg  in PM Stop nexium  Start pantoprazole  40mg  before breakfast and before dinner. Complete current famotidine  dose, then stop. Call GI for any cancellation once a week. Continue to monitor BP 2x/week. You can send readings via mychart every 2weeks. Use Bumex  1mg  and potassium 10mEq once a day as needed for leg/ankle swelling. Go to lab

## 2024-05-05 NOTE — Assessment & Plan Note (Signed)
>>  ASSESSMENT AND PLAN FOR SLOW TRANSIT CONSTIPATION WRITTEN ON 05/05/2024  2:22 PM BY Khaliyah Northrop LUM, NP  Improved constipation with use of miralax  daily

## 2024-05-05 NOTE — Assessment & Plan Note (Addendum)
 Stable BP with elimination of bumex , but she reports worsening LE edema. Home BP 104/75 and 117/79 No LE erythema or pain. No CP or SOB or dizziness BP Readings from Last 3 Encounters:  05/05/24 126/74  04/12/24 106/64  03/24/24 123/83    Wt Readings from Last 3 Encounters:  05/05/24 119 lb 3.2 oz (54.1 kg)  04/12/24 116 lb (52.6 kg)  03/24/24 114 lb 9.6 oz (52 kg)    Maintain metoprolol  dose Decrease amlodipine  dose to 5mg  in PM Continue to monitor BP 2x/week. You can send readings via mychart every 2weeks. Use Bumex  1mg  and potassium 10mEq once a day as needed for leg/ankle swelling. Repeat BMP F/up in 3months or sooner if needed

## 2024-05-05 NOTE — Assessment & Plan Note (Addendum)
 No improvement in heartburn, constant despite use of carafate , famotidine  40mg  and nexium  40mg . No dysphagia or hoarseness noted. She has upcoming appointment with GI 06/17/2024  Stop nexium  Start pantoprazole  40mg  before breakfast and before dinner. Complete current famotidine  dose, then stop. Call GI for any cancellation once a week.

## 2024-05-05 NOTE — Assessment & Plan Note (Signed)
 Improved constipation with use of miralax  daily

## 2024-05-05 NOTE — Progress Notes (Signed)
 Established Patient Visit  Patient: Patricia Peck   DOB: 1946/07/13   78 y.o. Female  MRN: [id number] Visit Date: 05/05/2024  Subjective:    Chief Complaint  Patient presents with   Follow-up    6 month follow up    HPI Essential hypertension Stable BP with elimination of bumex , but she reports worsening LE edema. Home BP 104/75 and 117/79 No LE erythema or pain. No CP or SOB or dizziness BP Readings from Last 3 Encounters:  05/05/24 126/74  04/12/24 106/64  03/24/24 123/83    Wt Readings from Last 3 Encounters:  05/05/24 119 lb 3.2 oz (54.1 kg)  04/12/24 116 lb (52.6 kg)  03/24/24 114 lb 9.6 oz (52 kg)    Maintain metoprolol  dose Decrease amlodipine  dose to 5mg  in PM Continue to monitor BP 2x/week. You can send readings via mychart every 2weeks. Use Bumex  1mg  and potassium 10mEq once a day as needed for leg/ankle swelling. Repeat BMP F/up in 3months or sooner if needed  Esophageal reflux No improvement in heartburn, constant despite use of carafate , famotidine  40mg  and nexium  40mg . No dysphagia or hoarseness noted. She has upcoming appointment with GI 06/17/2024  Stop nexium  Start pantoprazole  40mg  before breakfast and before dinner. Complete current famotidine  dose, then stop. Call GI for any cancellation once a week.  Slow transit constipation Improved constipation with use of miralax  daily   Reviewed medical, surgical, and social history today  Medications: Outpatient Medications Prior to Visit  Medication Sig   Alpha-D-Galactosidase (BEANO PO) Take 1 tablet by mouth 2 (two) times daily.   atorvastatin  (LIPITOR) 80 MG tablet TAKE 1 TABLET(80 MG) BY MOUTH DAILY   Benzocaine -Resorcinol (ANTI-ITCH VAGINAL) 20-3 % CREA    Calcium  Carbonate-Vitamin D  600-400 MG-UNIT tablet Take 1 tablet by mouth 2 (two) times daily.   Cholecalciferol (VITAMIN D -3) 5000 UNITS TABS Take 5,000 Units by mouth 3 (three) times a week. Tuesday Thursday Saturday    clonazePAM  (KLONOPIN ) 0.5 MG tablet Take 0.5 mg by mouth.   Cranberry 450 MG CAPS Take 2 capsules by mouth daily.   denosumab  (PROLIA ) 60 MG/ML SOLN injection Inject 60 mg into the skin once. Administer in upper arm, thigh, or abdomen   estradiol (ESTRACE) 0.1 MG/GM vaginal cream Place 1 Applicatorful vaginally 3 (three) times a week.   ezetimibe  (ZETIA ) 10 MG tablet Take 1 tablet (10 mg total) by mouth daily.   famotidine  (PEPCID ) 40 MG tablet Take 1 tablet (40 mg total) by mouth at bedtime.   levothyroxine  (SYNTHROID ) 50 MCG tablet Take 1 tablet (50 mcg total) by mouth daily before breakfast.   loratadine  (CLARITIN ) 10 MG tablet Take 10 mg by mouth daily.   metoprolol  succinate (TOPROL -XL) 50 MG 24 hr tablet Take 1 tablet (50 mg total) by mouth daily.   montelukast  (SINGULAIR ) 10 MG tablet TAKE 1 TABLET(10 MG) BY MOUTH AT BEDTIME   polyethylene glycol powder (GLYCOLAX /MIRALAX ) 17 GM/SCOOP powder Take 17 g by mouth daily.   temazepam  (RESTORIL ) 7.5 MG capsule Take 1 capsule (7.5 mg total) by mouth at bedtime.   vitamin E 180 MG (400 UNITS) capsule Take 400 Units by mouth 3 (three) times a week. Tuesdays, Thursdays, and Saturdays   XARELTO  15 MG TABS tablet TAKE 1 TABLET(15 MG) BY MOUTH DAILY WITH SUPPER   [DISCONTINUED] amLODipine  (NORVASC ) 10 MG tablet Take 1 tablet (10 mg total) by mouth daily.   [DISCONTINUED] esomeprazole  (  NEXIUM ) 40 MG capsule Take 1 capsule (40 mg total) by mouth daily before breakfast. Take 1 hour before eating.   [DISCONTINUED] nitrofurantoin  (MACRODANTIN ) 100 MG capsule Take 100 mg by mouth daily.   [DISCONTINUED] potassium chloride  (KLOR-CON ) 10 MEQ tablet Take 1 tablet (10 mEq total) by mouth daily.   COMIRNATY syringe    Meth-Hyo-M Bl-Na Phos-Ph Sal (URO-MP) 118 MG CAPS SMARTSIG:2 Capsule(s) By Mouth Morning-Night   trimethoprim  (TRIMPEX ) 100 MG tablet Take 1 tablet (100 mg total) by mouth daily.   [DISCONTINUED] acetaminophen  (TYLENOL  8 HOUR) 650 MG CR tablet Take  1 tablet (650 mg total) by mouth 4 (four) times daily. (Patient not taking: Reported on 05/05/2024)   [DISCONTINUED] ciprofloxacin  (CIPRO ) 250 MG tablet Take 250 mg by mouth 2 (two) times daily. (Patient not taking: Reported on 04/12/2024)   [DISCONTINUED] lidocaine  4 % Place 1 patch onto the skin 2 (two) times daily. (Patient not taking: Reported on 05/05/2024)   [DISCONTINUED] OVER THE COUNTER MEDICATION Take 2 tablets by mouth daily. Bounty  Hair,Nail, and Skin daily gummies. (Patient not taking: Reported on 05/05/2024)   [DISCONTINUED] PAXLOVID, 150/100, 10 x 150 MG & 10 x 100MG  TBPK Take by mouth.   [DISCONTINUED] rOPINIRole  (REQUIP ) 0.25 MG tablet TAKE 1 TABLET(0.25 MG) BY MOUTH AT BEDTIME (Patient not taking: Reported on 04/12/2024)   [DISCONTINUED] senna-docusate (SENOKOT-S) 8.6-50 MG tablet Take 1 tablet by mouth daily. (Patient not taking: Reported on 05/05/2024)   [DISCONTINUED] sucralfate  (CARAFATE ) 1 GM/10ML suspension Take 10 mLs (1 g total) by mouth 4 (four) times daily -  with meals and at bedtime.   [DISCONTINUED] tiZANidine  (ZANAFLEX ) 4 MG tablet Take 1 tablet (4 mg total) by mouth every 8 (eight) hours as needed for muscle spasms. (Patient not taking: Reported on 05/05/2024)   No facility-administered medications prior to visit.   Reviewed past medical and social history.   ROS per HPI above      Objective:  BP 126/74 (BP Location: Left Arm, Patient Position: Sitting, Cuff Size: Normal)   Pulse 68   Temp (!) 97.5 F (36.4 C) (Oral)   Ht 5' 4 (1.626 m)   Wt 119 lb 3.2 oz (54.1 kg)   SpO2 98%   BMI 20.46 kg/m      Physical Exam Cardiovascular:     Rate and Rhythm: Normal rate. Rhythm irregular.     Pulses: Normal pulses.     Heart sounds: Normal heart sounds.  Pulmonary:     Effort: Pulmonary effort is normal.     Breath sounds: Normal breath sounds.  Neurological:     Mental Status: She is alert and oriented to person, place, and time.     No results found for any  visits on 05/05/24.    Assessment & Plan:    Problem List Items Addressed This Visit     Esophageal reflux - Primary   No improvement in heartburn, constant despite use of carafate , famotidine  40mg  and nexium  40mg . No dysphagia or hoarseness noted. She has upcoming appointment with GI 06/17/2024  Stop nexium  Start pantoprazole  40mg  before breakfast and before dinner. Complete current famotidine  dose, then stop. Call GI for any cancellation once a week.      Relevant Medications   pantoprazole  (PROTONIX ) 40 MG tablet   Essential hypertension   Stable BP with elimination of bumex , but she reports worsening LE edema. Home BP 104/75 and 117/79 No LE erythema or pain. No CP or SOB or dizziness BP Readings from Last 3  Encounters:  05/05/24 126/74  04/12/24 106/64  03/24/24 123/83    Wt Readings from Last 3 Encounters:  05/05/24 119 lb 3.2 oz (54.1 kg)  04/12/24 116 lb (52.6 kg)  03/24/24 114 lb 9.6 oz (52 kg)    Maintain metoprolol  dose Decrease amlodipine  dose to 5mg  in PM Continue to monitor BP 2x/week. You can send readings via mychart every 2weeks. Use Bumex  1mg  and potassium 10mEq once a day as needed for leg/ankle swelling. Repeat BMP F/up in 3months or sooner if needed      Relevant Medications   amLODipine  (NORVASC ) 5 MG tablet   bumetanide  (BUMEX ) 1 MG tablet   Other Relevant Orders   Renal Function Panel   Hypothyroidism   Relevant Orders   T4, free   TSH   Slow transit constipation   Improved constipation with use of miralax  daily      Other Visit Diagnoses       Diuretic-induced hypokalemia       Relevant Medications   potassium chloride  (KLOR-CON ) 10 MEQ tablet     Leukocytosis, unspecified type       Relevant Orders   CBC with Differential/Platelet     Localized edema       Relevant Medications   potassium chloride  (KLOR-CON ) 10 MEQ tablet   bumetanide  (BUMEX ) 1 MG tablet      Return in about 3 months (around 08/05/2024) for Hypothyroidism,  HTN, hyperlipidemia (fasting).     Roselie Mood, NP

## 2024-05-09 ENCOUNTER — Encounter: Payer: Self-pay | Admitting: Nurse Practitioner

## 2024-05-09 DIAGNOSIS — E038 Other specified hypothyroidism: Secondary | ICD-10-CM

## 2024-05-09 MED ORDER — LEVOTHYROXINE SODIUM 50 MCG PO TABS: 50.0000 ug | ORAL_TABLET | Freq: Every day | ORAL | 1 refills | Status: DC

## 2024-05-09 NOTE — Telephone Encounter (Signed)
 Patient learning and getting familiar with sending MyChart messages.

## 2024-05-09 NOTE — Telephone Encounter (Signed)
 Patient came into the office for me to show her how to send messages via MyChart from her mobile phone. Wrote down the steps for patient and help her send in test messages. She also informed me that the pharmacy stated that they needed to talk to our office about a medication that was sent over from her office visit with [facility] on 05/05/24.   Called patient's pharmacy and informed reason for my call. I was informed that a new Rx was needed for patient Levothryroxine 50 mcg. New Rx was sent to pharmacy. Patient made aware and she thanked me for all of my help today.

## 2024-05-10 ENCOUNTER — Other Ambulatory Visit: Payer: Self-pay | Admitting: Cardiology

## 2024-05-10 ENCOUNTER — Ambulatory Visit: Payer: Self-pay | Admitting: Nurse Practitioner

## 2024-05-10 DIAGNOSIS — I480 Paroxysmal atrial fibrillation: Secondary | ICD-10-CM

## 2024-05-10 DIAGNOSIS — N1832 Chronic kidney disease, stage 3b: Secondary | ICD-10-CM

## 2024-05-10 NOTE — Telephone Encounter (Signed)
 Prescription refill request for Xarelto  received.  Indication: Afib  Last office visit: 11/13/23 [place])  Weight: 54.1kg Age: 78 Scr: 1.0 (03/15/24)  CrCl: 39.75ml/min  Appropriate dose. Refill sent.

## 2024-05-12 ENCOUNTER — Telehealth: Payer: Self-pay

## 2024-05-12 NOTE — Telephone Encounter (Signed)
 Pharmacy Patient Advocate Encounter   Received notification from Fax that prior authorization for Esomeprazole  Magnesium  40MG  dr capsules is due for renewal.   Insurance verification completed.   The patient is insured through [facility].  Action: Medication has been discontinued. Archived Key: [id number]

## 2024-05-16 DIAGNOSIS — N3021 Other chronic cystitis with hematuria: Secondary | ICD-10-CM | POA: Diagnosis not present

## 2024-05-17 ENCOUNTER — Other Ambulatory Visit: Payer: Self-pay

## 2024-05-17 DIAGNOSIS — E7800 Pure hypercholesterolemia, unspecified: Secondary | ICD-10-CM

## 2024-05-17 MED ORDER — ATORVASTATIN CALCIUM 80 MG PO TABS: 80.0000 mg | ORAL_TABLET | Freq: Every day | ORAL | 1 refills | Status: AC

## 2024-05-26 ENCOUNTER — Other Ambulatory Visit (HOSPITAL_COMMUNITY): Payer: Self-pay

## 2024-05-27 ENCOUNTER — Telehealth: Payer: Self-pay | Admitting: Nurse Practitioner

## 2024-05-27 NOTE — Telephone Encounter (Signed)
 Placed patient's at home blood pressure readings and letter from [facility] in provider folder

## 2024-05-27 NOTE — Telephone Encounter (Signed)
 Pt dropped off Prolia  PA form stating it will expire on 07/14/24 from [place]. I have placed in [place]'s folder up front.

## 2024-05-30 ENCOUNTER — Telehealth: Payer: Self-pay

## 2024-05-30 DIAGNOSIS — M810 Age-related osteoporosis without current pathological fracture: Secondary | ICD-10-CM

## 2024-05-30 NOTE — Telephone Encounter (Signed)
 New encounter created for Prolia  BIV.   Please follow CAD Authorization workflow and place a CAM order to initiate Prolia  Benefits investigation. Standard turnaround time is 2 weeks.

## 2024-05-30 NOTE — Telephone Encounter (Signed)
 Prolia  VOB initiated via [email or web address]  Next Prolia  inj DUE: 08/11/24

## 2024-05-31 ENCOUNTER — Other Ambulatory Visit (HOSPITAL_COMMUNITY): Payer: Self-pay

## 2024-05-31 MED ORDER — DENOSUMAB 60 MG/ML ~~LOC~~ SOSY: 60.0000 mg | PREFILLED_SYRINGE | Freq: Once | SUBCUTANEOUS | 0 refills | Status: DC

## 2024-05-31 NOTE — Addendum Note (Signed)
 Addended by: LENON ROUGHEN on: 05/31/2024 11:30 AM   Modules accepted: Orders

## 2024-05-31 NOTE — Telephone Encounter (Signed)
 Pt ready for scheduling for PROLIA  on or after : 08/11/24  Option# 1: Buy/Bill (Office supplied medication)  Out-of-pocket cost due at time of clinic visit: $350  Number of injection/visits approved: 2  Primary: HUMANA Prolia  co-insurance: 0% Admin fee co-insurance: 0%  Secondary: BCBS[place]-FEP Prolia  co-insurance:  Admin fee co-insurance:   Medical Benefit Details: Date Benefits were checked: 05/31/24 Deductible: NO (HUMANA); $0 Met of $350 Required / Coinsurance: 0%/ Admin Fee: 0%  Prior Auth: APPROVED PA# [id number] Expiration Date: 11/04/23-11/02/24   # of doses approved: 2 ----------------------------------------------------------------------- Option# 2- Med Obtained from pharmacy:  Pharmacy benefit: Copay $--- REFILL TOO SOON (Paid to pharmacy) Admin Fee: 0% (Pay at clinic)  Prior Auth: N/A PA# Expiration Date:   # of doses approved:   If patient wants fill through the pharmacy benefit please send prescription to: [facility], and include estimated need by date in rx notes. Pharmacy will ship medication directly to the office.  Patient NOT eligible for Prolia  Copay Card. Copay Card can make patient's cost as little as $25. Link to apply: [email or web address]://[email or web address].[email or web address]  ** This summary of benefits is an estimation of the patient's out-of-pocket cost. Exact cost may very based on individual plan coverage.

## 2024-05-31 NOTE — Telephone Encounter (Signed)
 Patricia Peck

## 2024-06-04 ENCOUNTER — Other Ambulatory Visit: Payer: Self-pay | Admitting: Nurse Practitioner

## 2024-06-04 DIAGNOSIS — F5105 Insomnia due to other mental disorder: Secondary | ICD-10-CM

## 2024-06-06 ENCOUNTER — Other Ambulatory Visit: Payer: Self-pay | Admitting: Cardiology

## 2024-06-06 DIAGNOSIS — I480 Paroxysmal atrial fibrillation: Secondary | ICD-10-CM

## 2024-06-09 ENCOUNTER — Other Ambulatory Visit: Payer: Self-pay | Admitting: Cardiology

## 2024-06-09 DIAGNOSIS — I480 Paroxysmal atrial fibrillation: Secondary | ICD-10-CM

## 2024-06-10 ENCOUNTER — Other Ambulatory Visit: Payer: Self-pay | Admitting: Cardiology

## 2024-06-10 ENCOUNTER — Other Ambulatory Visit

## 2024-06-10 ENCOUNTER — Telehealth: Payer: Self-pay | Admitting: Cardiology

## 2024-06-10 DIAGNOSIS — N1832 Chronic kidney disease, stage 3b: Secondary | ICD-10-CM

## 2024-06-10 DIAGNOSIS — I480 Paroxysmal atrial fibrillation: Secondary | ICD-10-CM

## 2024-06-10 LAB — RENAL FUNCTION PANEL
Albumin: 4.5 g/dL (ref 3.5–5.2)
BUN: 15 mg/dL (ref 6–23)
CO2: 27 meq/L (ref 19–32)
Calcium: 8.7 mg/dL (ref 8.4–10.5)
Chloride: 105 meq/L (ref 96–112)
Creatinine, Ser: 0.95 mg/dL (ref 0.40–1.20)
GFR: 57.19 mL/min — ABNORMAL LOW (ref >60.00)
Glucose, Bld: 83 mg/dL (ref 70–99)
Phosphorus: 3.5 mg/dL (ref 2.3–4.6)
Potassium: 3.8 meq/L (ref 3.5–5.1)
Sodium: 142 meq/L (ref 135–145)

## 2024-06-10 NOTE — Telephone Encounter (Signed)
*  STAT* If patient is at the pharmacy, call can be transferred to refill team.   1. Which medications need to be refilled? (please list name of each medication and dose if known) metoprolol  succinate (TOPROL -XL) 50 MG 24 hr tablet    4. Which pharmacy/location (including street and city if local pharmacy) is medication to be sent to? [id number] - [place], [place] - [address] AT [address]     5. Do they need a 30 day or 90 day supply? 47   Spoke with pharmacy per pt's request, they stated they cannot find refill, asked that it be sent over again.

## 2024-06-10 NOTE — Telephone Encounter (Signed)
 Looks like Metoprolol  was filled yesterday, 06/09/24.

## 2024-06-11 ENCOUNTER — Other Ambulatory Visit: Payer: Self-pay | Admitting: Cardiology

## 2024-06-11 DIAGNOSIS — I480 Paroxysmal atrial fibrillation: Secondary | ICD-10-CM

## 2024-06-12 ENCOUNTER — Ambulatory Visit: Payer: Self-pay | Admitting: Nurse Practitioner

## 2024-06-13 NOTE — Telephone Encounter (Signed)
*  STAT* If patient is at the pharmacy, call can be transferred to refill team.   1. Which medications need to be refilled? (please list name of each medication and dose if known)   metoprolol  succinate (TOPROL -XL) 50 MG 24 hr tablet   2. Would you like to learn more about the convenience, safety, & potential cost savings by using the [facility]?   3. Are you open to using the [facility] (Type [facility]. ).  4. Which pharmacy/location (including street and city if local pharmacy) is medication to be sent to?  [id number] - [place], [place] - [address]   5. Do they need a 30 day or 90 day supply?   90 day  Patient stated [facility] told her they have not received this refill request and wants it re-sent.  Patient stated she still has some medication left.

## 2024-06-14 NOTE — Telephone Encounter (Signed)
 Called patient and informed her of provider comments from recent labs. Patient verbalized understanding and all (if any) questions were answered.

## 2024-06-15 ENCOUNTER — Other Ambulatory Visit: Payer: Self-pay | Admitting: Cardiology

## 2024-06-15 DIAGNOSIS — I480 Paroxysmal atrial fibrillation: Secondary | ICD-10-CM

## 2024-06-17 ENCOUNTER — Encounter: Payer: Self-pay | Admitting: Gastroenterology

## 2024-06-17 ENCOUNTER — Ambulatory Visit (INDEPENDENT_AMBULATORY_CARE_PROVIDER_SITE_OTHER): Admitting: Gastroenterology

## 2024-06-17 VITALS — BP 110/70 | HR 70 | Ht 63.0 in | Wt 117.0 lb

## 2024-06-17 DIAGNOSIS — K5900 Constipation, unspecified: Secondary | ICD-10-CM | POA: Diagnosis not present

## 2024-06-17 DIAGNOSIS — K769 Liver disease, unspecified: Secondary | ICD-10-CM

## 2024-06-17 DIAGNOSIS — K219 Gastro-esophageal reflux disease without esophagitis: Secondary | ICD-10-CM

## 2024-06-17 DIAGNOSIS — K449 Diaphragmatic hernia without obstruction or gangrene: Secondary | ICD-10-CM

## 2024-06-17 NOTE — Patient Instructions (Addendum)
 Follow up with Camie Furbish, PA-C on 08/16/24.  Continue present medications.   You have been scheduled for an Upper GI Series at [facility]. Your appointment is on 07/05/24 at 11 am. Please arrive 15 minutes prior to your test for registration. Make sure not to eat or drink anything after midnight on the night before your test. If you need to reschedule, please call radiology at [phone]. ________________________________________________________________ An upper GI series uses x rays to help diagnose problems of the upper GI tract, which includes the esophagus, stomach, and duodenum. The duodenum is the first part of the small intestine. An upper GI series is conducted by a radiology technologist or a radiologist--a doctor who specializes in x-ray imaging--at a hospital or outpatient center. While sitting or standing in front of an x-ray machine, the patient drinks barium liquid, which is often white and has a chalky consistency and taste. The barium liquid coats the lining of the upper GI tract and makes signs of disease show up more clearly on x rays. X-ray video, called fluoroscopy, is used to view the barium liquid moving through the esophagus, stomach, and duodenum. Additional x rays and fluoroscopy are performed while the patient lies on an x-ray table. To fully coat the upper GI tract with barium liquid, the technologist or radiologist may press on the abdomen or ask the patient to change position. Patients hold still in various positions, allowing the technologist or radiologist to take x rays of the upper GI tract at different angles. If a technologist conducts the upper GI series, a radiologist will later examine the images to look for problems.  This test typically takes about 1 hour to complete. __________________________________________________________________   _______________________________________________________  If your blood pressure at your visit was 140/90 or greater,  please contact your primary care physician to follow up on this.  _______________________________________________________  If you are age 79 or older, your body mass index should be between 23-30. Your Body mass index is 20.73 kg/m. If this is out of the aforementioned range listed, please consider follow up with your Primary Care Provider.  If you are age 50 or younger, your body mass index should be between 19-25. Your Body mass index is 20.73 kg/m. If this is out of the aformentioned range listed, please consider follow up with your Primary Care Provider.   ________________________________________________________  The [facility] providers would like to encourage you to use MYCHART to communicate with providers for non-urgent requests or questions.  Due to long hold times on the telephone, sending your provider a message by [facility] may be a faster and more efficient way to get a response.  Please allow 48 business hours for a response.  Please remember that this is for non-urgent requests.  _______________________________________________________  [facility] is using a team-based approach to care.  Your team is made up of your doctor and two to three APPS. Our APPS (Nurse Practitioners and Physician Assistants) work with your physician to ensure care continuity for you. They are fully qualified to address your health concerns and develop a treatment plan. They communicate directly with your gastroenterologist to care for you. Seeing the Advanced Practice Practitioners on your physician's team can help you by facilitating care more promptly, often allowing for earlier appointments, access to diagnostic testing, procedures, and other specialty referrals.

## 2024-06-17 NOTE — Progress Notes (Signed)
 Patricia Peck [id number] Feb 20, 1946   Chief Complaint: Heartburn  Referring Provider: Katheen Roselie Rockford, NP Primary GI MD: Sampson (previous Dr. Debrah)  HPI: NIL Patricia Peck is a 78 y.o. female with past medical history of arthritis, asthma, A-fib, a flutter, depression, GERD, HLD, HTN, hypothyroidism, osteoporosis, TIA 2017, hysterectomy who presents today for a complaint of GERD and constipation.    Seen by PCP 03/24/2024 and noted to have a moderate-sized hiatal hernia with severe heartburn and indigestion.  Nexium  20 mg was insufficient and she was increased to 40 mg daily, famotidine  increased to 40 mg at night, and sucralfate  added for breakthrough heartburn.  Referred for further evaluation.  Also had some weight loss from 120 pounds to 111 pounds.  Was having constipation was advised to take MiraLAX  1 capful daily as well as senna as needed.  Seen by PCP 05/05/2024 and noted to have constant heartburn with no improvement in heartburn despite use of Carafate , famotidine , and Nexium .  She was switched to pantoprazole  40 mg twice daily.  No anemia on recent PCP labs.  Does have decreased renal function.   Today patient states that she has been having intermittent heartburn a couple times a week after eating.  Denies daily symptoms.  Denies acid reflux or dysphagia.  She does have some improvement on Protonix , thinks she may have had more improvement on Nexium .  Does not recall taking Carafate .  States that she has not had a history of heartburn and symptoms began this summer.  She thinks she may have had an EGD in the past but she is unsure, and I have no record of this.  When she gets heartburn her symptoms can last up to 15 minutes.  She denies any particular food triggers.  Symptoms occur after eating.  She is making lifestyle adjustments including elevating the head of her bed at night, staying upright after meals, etc.  Her weight at this time is stable.  States she has a  longstanding history of constipation which has recently improved since alternating between MiraLAX  and herbal tea at night.  She denies any blood in her stool or melena.  Denies abdominal or rectal pain.  No family history of colon cancer.  She denies any history of colon polyps.  Last colonoscopy 2015 was normal aside from mild diverticulosis in the sigmoid colon.  Patient has history of atrial fibrillation and is on Xarelto .  She denies history of MI but did have a TIA in 2017.  Denies chest pain or shortness of breath.  Previous GI Procedures/Imaging   CT A/P 03/15/2024 - Moderate size sliding-type hiatal hernia. - Two small rounded low densities are noted in right hepatic lobe of uncertain etiology. These most likely represent cysts in the absence of any history of malignancy. If the patient does have a history of malignancy, MRI is recommended to evaluate for possible metastatic disease. 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 - Aortic Atherosclerosis (ICD10-I70.0).  Colonoscopy 07/20/2014 - Mild diverticulosis noted in the sigmoid colon - Otherwise normal  Colonoscopy 02/23/2004 - Normal  Past Medical History:  Diagnosis Date   Allergy    Arthritis    hands   Asthma    uses an inhaler   Atrial fibrillation (HCC)    Atrial flutter (HCC)    Depression    Dysrhythmia    a-fib, on Xarelto    GERD (gastroesophageal reflux disease)    Hyperlipidemia    Hypertension  Hypotension 03/24/2024   Hypothyroidism    Osteoporosis    Stroke [facility]) 2017   TIA   TIA (transient ischemic attack)     Past Surgical History:  Procedure Laterality Date   ABDOMINAL HYSTERECTOMY     COLONOSCOPY     MYRINGOTOMY WITH TUBE PLACEMENT Right 01/14/2016   Procedure: RIGHT MYRINGOTOMY WITH T-TUBE PLACEMENT;  Surgeon: Daniel Moccasin, MD;  Location: [facility];  Service: ENT;  Laterality: Right;   PARTIAL HYSTERECTOMY  1989   ROBOTIC ASSISTED  BILATERAL SALPINGO OOPHERECTOMY Bilateral 02/14/2021   Procedure: XI ROBOTIC ASSISTED BILATERAL SALPINGO OOPHORECTOMY;  Surgeon: Eloy Herring, MD;  Location: [facility];  Service: Gynecology;  Laterality: Bilateral;   TEE WITHOUT CARDIOVERSION N/A 12/22/2012   Procedure: TRANSESOPHAGEAL ECHOCARDIOGRAM (TEE);  Surgeon: Redell GORMAN Shallow, MD;  Location: [facility] END[facility]COPY;  Service: Cardiovascular;  Laterality: N/A;    Current Outpatient Medications  Medication Sig Dispense Refill   Alpha-D-Galactosidase (BEANO PO) Take 1 tablet by mouth 2 (two) times daily.     amLODipine  (NORVASC ) 5 MG tablet Take 1 tablet (5 mg total) by mouth every evening. 90 tablet 1   atorvastatin  (LIPITOR) 80 MG tablet Take 1 tablet (80 mg total) by mouth daily. 90 tablet 1   Benzocaine -Resorcinol (ANTI-ITCH VAGINAL) 20-3 % CREA      bumetanide  (BUMEX ) 1 MG tablet Take 1 tablet (1 mg total) by mouth daily as needed. For leg and ankle swelling. Take with potassium chloride  10mEq 30 tablet 5   Calcium  Carbonate-Vitamin D  600-400 MG-UNIT tablet Take 1 tablet by mouth 2 (two) times daily.     Cholecalciferol (VITAMIN D -3) 5000 UNITS TABS Take 5,000 Units by mouth 3 (three) times a week. Tuesday Thursday Saturday     COMIRNATY syringe      Cranberry 450 MG CAPS Take 2 capsules by mouth daily.     denosumab  (PROLIA ) 60 MG/ML SOLN injection Inject 60 mg into the skin once. Administer in upper arm, thigh, or abdomen 180 mL 0   [START ON 08/11/2024] denosumab  (PROLIA ) 60 MG/ML SOSY injection Inject 60 mg into the skin once for 1 dose. 1 mL 0   estradiol (ESTRACE) 0.1 MG/GM vaginal cream Place 1 Applicatorful vaginally 3 (three) times a week.     ezetimibe  (ZETIA ) 10 MG tablet Take 1 tablet (10 mg total) by mouth daily. 90 tablet 2   famotidine  (PEPCID ) 40 MG tablet Take 1 tablet (40 mg total) by mouth at bedtime. 90 tablet 0   levothyroxine  (SYNTHROID ) 50 MCG tablet Take 1 tablet (50 mcg total) by mouth daily before breakfast. 90 tablet 1    loratadine  (CLARITIN ) 10 MG tablet Take 10 mg by mouth daily.     Meth-Hyo-M Bl-Na Phos-Ph Sal (URO-MP) 118 MG CAPS SMARTSIG:2 Capsule(s) By Mouth Morning-Night     metoprolol  succinate (TOPROL -XL) 50 MG 24 hr tablet TAKE 1 TABLET(50 MG) BY MOUTH DAILY 90 tablet 3   montelukast  (SINGULAIR ) 10 MG tablet TAKE 1 TABLET(10 MG) BY MOUTH AT BEDTIME 90 tablet 1   pantoprazole  (PROTONIX ) 40 MG tablet Take 1 tablet (40 mg total) by mouth 2 (two) times daily before a meal. 60 tablet 5   polyethylene glycol powder (GLYCOLAX /MIRALAX ) 17 GM/SCOOP powder Take 17 g by mouth daily. 500 g 0   potassium chloride  (KLOR-CON ) 10 MEQ tablet Take 1 tablet (10 mEq total) by mouth daily as needed. For leg and ankle swelling. Take with bumex  1mg  90 tablet 0   temazepam  (RESTORIL ) 7.5 MG capsule TAKE  1 CAPSULE(7.5 MG) BY MOUTH AT BEDTIME 30 capsule 5   trimethoprim  (TRIMPEX ) 100 MG tablet Take 1 tablet (100 mg total) by mouth daily. 30 tablet 11   vitamin E 180 MG (400 UNITS) capsule Take 400 Units by mouth 3 (three) times a week. Tuesdays, Thursdays, and Saturdays     XARELTO  15 MG TABS tablet TAKE 1 TABLET(15 MG) BY MOUTH DAILY WITH SUPPER 90 tablet 1   No current facility-administered medications for this visit.    Allergies as of 06/17/2024   (No Known Allergies)    Family History  Problem Relation Age of Onset   Breast cancer Mother    Drug abuse Son    Depression Son    Stroke Paternal Aunt    Heart disease Paternal Grandfather    Diabetes Maternal Grandfather    Colon cancer Neg Hx    Ovarian cancer Neg Hx    Endometrial cancer Neg Hx    Pancreatic cancer Neg Hx    Prostate cancer Neg Hx     Social History   Tobacco Use   Smoking status: Never    Passive exposure: Never   Smokeless tobacco: Never  Vaping Use   Vaping status: Never Used  Substance Use Topics   Alcohol use: Not Currently    Comment: Rare   Drug use: No     Review of Systems:    Constitutional: No fever, chills, weakness or  fatigue Cardiovascular: No chest pain Respiratory: No SOB Gastrointestinal: See HPI and otherwise negative Hematologic: No bleeding    Physical Exam:  Vital signs: BP 110/70   Pulse 70   Ht 5' 3 (1.6 m)   Wt 117 lb (53.1 kg)   BMI 20.73 kg/m   Wt Readings from Last 3 Encounters:  06/17/24 117 lb (53.1 kg)  05/05/24 119 lb 3.2 oz (54.1 kg)  04/12/24 116 lb (52.6 kg)     Constitutional: Pleasant female in NAD, alert and cooperative Head:  Normocephalic and atraumatic.  Eyes: No scleral icterus.  Respiratory: Respirations even and unlabored. Lungs clear to auscultation bilaterally.  No wheezes, crackles, or rhonchi.  Cardiovascular:  Regular rate and rhythm. No murmurs. No peripheral edema. Gastrointestinal:  Soft, nondistended, nontender. No rebound or guarding. Normal bowel sounds. No appreciable masses or hepatomegaly. Rectal:  Not performed.  Neurologic:  Alert and oriented x4;  grossly normal neurologically.  Skin:   Dry and intact without significant lesions or rashes. Psychiatric: Oriented to person, place and time. Demonstrates good judgement and reason without abnormal affect or behaviors.   RELEVANT LABS AND IMAGING: CBC    Component Value Date/Time   WBC 9.2 05/05/2024 1401   RBC 4.40 05/05/2024 1401   HGB 13.1 05/05/2024 1401   HGB 13.6 11/13/2023 0917   HCT 38.5 05/05/2024 1401   HCT 41.4 11/13/2023 0917   PLT 371.0 05/05/2024 1401   PLT 354 11/13/2023 0917   MCV 87.5 05/05/2024 1401   MCV 90 11/13/2023 0917   MCH 29.1 03/15/2024 1347   MCHC 34.0 05/05/2024 1401   RDW 14.1 05/05/2024 1401   RDW 12.5 11/13/2023 0917   LYMPHSABS 2.2 05/05/2024 1401   MONOABS 1.0 05/05/2024 1401   EOSABS 0.3 05/05/2024 1401   BASOSABS 0.1 05/05/2024 1401    CMP     Component Value Date/Time   NA 142 06/10/2024 1005   NA 142 11/13/2023 0917   K 3.8 06/10/2024 1005   CL 105 06/10/2024 1005   CO2 27 06/10/2024 1005  GLUCOSE 83 06/10/2024 1005   BUN 15  06/10/2024 1005   BUN 17 11/13/2023 0917   CREATININE 0.95 06/10/2024 1005   CREATININE 0.98 (H) 05/23/2019 1429   CALCIUM  8.7 06/10/2024 1005   PROT 7.4 03/15/2024 1347   PROT 7.4 11/13/2023 0917   ALBUMIN 4.5 06/10/2024 1005   ALBUMIN 4.8 11/13/2023 0917   AST 28 03/15/2024 1347   ALT 17 03/15/2024 1347   ALKPHOS 97 03/15/2024 1347   BILITOT 0.6 03/15/2024 1347   BILITOT 0.5 11/13/2023 0917   GFRNONAA 58 (L) 03/15/2024 1347   GFRNONAA 72 10/04/2014 1216   GFRAA 59 (L) 03/26/2020 1023   GFRAA 83 10/04/2014 1216   Echocardiogram 12/07/2020 1. Left ventricular ejection fraction, by estimation, is 55 to 60% . The left ventricle has normal function. The left ventricle has no regional wall motion abnormalities. Left ventricular diastolic parameters are indeterminate.  2. Right ventricular systolic function is normal. The right ventricular size is normal.  3. The mitral valve is grossly normal. Trivial mitral valve regurgitation.  4. The aortic valve is tricuspid. Aortic valve regurgitation is mild. No aortic stenosis is present.  5. There is borderline dilatation of the ascending aorta, measuring 37 mm.  6. The inferior vena cava is normal in size with greater than 50% respiratory variability, suggesting right atrial pressure of 3 mmHg.  Assessment/Plan:   Heartburn Patient reports intermittent heartburn after meals which is occurring a couple times a week despite use of PPI and famotidine .  Has had changes in her medications.  Originally was on Nexium  20 mg, then went up to Nexium  40 mg with addition of famotidine .  Recently was switched to Protonix .  No clear identifiable food triggers.  She denies any dysphagia, nausea, vomiting, acid reflux, abdominal pain, melena.  She is unsure whether she has had a prior EGD.  I do not see record of this.   States her symptoms are new this summer.   Recent CT showed a moderate-sized sliding-type hiatal hernia.  Discussed further options for workup  to include upper GI imaging and upper endoscopy.  Will have patient continue present medications for now and schedule UGI series.  If any concerning findings or if symptoms persist we will plan for EGD. Patient is on Xarelto  and will need to get permission from cardiology to hold this prior to procedure.  - Order UGI series - Continue Protonix  40 mg BID - Continue famotidine  40 mg in the evening - Lifestyle modifications for GERD  Constipation Patient has recently been having problems with constipation, which is now improved alternating between MiraLAX  and herbal tea/senna.  Denies any diarrhea, blood in her stool, melena, rectal pain, abdominal pain. No history of colon polyps or family history of colon cancer.  Last colonoscopy in 2015 showed sigmoid diverticulosis and otherwise normal.  - Continue MiraLAX  and tea for constipation - Consider fiber supplement  Liver lesions CT A/P 03/15/2024 showed a moderate-sized sliding-type hiatal hernia, 2 small low-density in the right hepatic lobe most likely representing cyst in the absence of any history of malignancy, with MRI recommended to evaluate for metastatic disease if patient does have history of malignancy.   Patient denies history of malignancy.  - Consider MRI for further evaluation.  Will discuss further with MD.   Camie Furbish, PA-C [facility] 06/17/2024, 10:17 AM  Supervising GI MD:  Note reviewed - agree with plan at this time - EGD may be needed as above. Will need to follow-up on weight and  the upper GI - review with me when completed.  I do not recommend MR liver at this time.  Lupita CHARLENA Commander, MD, [facility]      Patient Care Team: Nche, Roselie Rockford, NP as PCP - General (Internal Medicine) Pietro Redell RAMAN, MD as PCP - Cardiology (Cardiology) Pietro Redell RAMAN, MD as Consulting Physician (Cardiology) Karis Clunes, MD as Consulting Physician (Otolaryngology) Frutoso Luz, MD as Referring Physician (Allergy)

## 2024-06-29 ENCOUNTER — Other Ambulatory Visit: Payer: Self-pay | Admitting: Family Medicine

## 2024-06-29 DIAGNOSIS — K219 Gastro-esophageal reflux disease without esophagitis: Secondary | ICD-10-CM

## 2024-07-05 ENCOUNTER — Ambulatory Visit (HOSPITAL_COMMUNITY)
Admission: RE | Admit: 2024-07-05 | Discharge: 2024-07-05 | Disposition: A | Discharge status: Home / Self Care | Source: Ambulatory Visit | Attending: Gastroenterology | Admitting: Gastroenterology

## 2024-07-05 DIAGNOSIS — K219 Gastro-esophageal reflux disease without esophagitis: Secondary | ICD-10-CM | POA: Insufficient documentation

## 2024-07-05 DIAGNOSIS — K5710 Diverticulosis of small intestine without perforation or abscess without bleeding: Secondary | ICD-10-CM | POA: Diagnosis not present

## 2024-07-05 DIAGNOSIS — K449 Diaphragmatic hernia without obstruction or gangrene: Secondary | ICD-10-CM | POA: Diagnosis not present

## 2024-07-06 ENCOUNTER — Ambulatory Visit: Payer: Self-pay | Admitting: Gastroenterology

## 2024-07-12 ENCOUNTER — Emergency Department (HOSPITAL_BASED_OUTPATIENT_CLINIC_OR_DEPARTMENT_OTHER)

## 2024-07-12 ENCOUNTER — Encounter (HOSPITAL_BASED_OUTPATIENT_CLINIC_OR_DEPARTMENT_OTHER): Payer: Self-pay | Admitting: Emergency Medicine

## 2024-07-12 ENCOUNTER — Emergency Department (HOSPITAL_BASED_OUTPATIENT_CLINIC_OR_DEPARTMENT_OTHER): Admitting: Radiology

## 2024-07-12 ENCOUNTER — Observation Stay (HOSPITAL_BASED_OUTPATIENT_CLINIC_OR_DEPARTMENT_OTHER)
Admission: EM | Admit: 2024-07-12 | Discharge: 2024-07-18 | Disposition: A | Discharge status: Home / Self Care | Attending: Emergency Medicine | Admitting: Emergency Medicine

## 2024-07-12 ENCOUNTER — Other Ambulatory Visit: Payer: Self-pay

## 2024-07-12 DIAGNOSIS — E876 Hypokalemia: Secondary | ICD-10-CM | POA: Insufficient documentation

## 2024-07-12 DIAGNOSIS — M6289 Other specified disorders of muscle: Secondary | ICD-10-CM

## 2024-07-12 DIAGNOSIS — M545 Low back pain, unspecified: Secondary | ICD-10-CM | POA: Diagnosis not present

## 2024-07-12 DIAGNOSIS — M9905 Segmental and somatic dysfunction of pelvic region: Secondary | ICD-10-CM | POA: Diagnosis not present

## 2024-07-12 DIAGNOSIS — K5901 Slow transit constipation: Secondary | ICD-10-CM

## 2024-07-12 DIAGNOSIS — E039 Hypothyroidism, unspecified: Secondary | ICD-10-CM | POA: Insufficient documentation

## 2024-07-12 DIAGNOSIS — D649 Anemia, unspecified: Secondary | ICD-10-CM | POA: Diagnosis not present

## 2024-07-12 DIAGNOSIS — M79672 Pain in left foot: Secondary | ICD-10-CM | POA: Diagnosis not present

## 2024-07-12 DIAGNOSIS — J45909 Unspecified asthma, uncomplicated: Secondary | ICD-10-CM | POA: Insufficient documentation

## 2024-07-12 DIAGNOSIS — D72829 Elevated white blood cell count, unspecified: Secondary | ICD-10-CM | POA: Insufficient documentation

## 2024-07-12 DIAGNOSIS — I480 Paroxysmal atrial fibrillation: Secondary | ICD-10-CM | POA: Insufficient documentation

## 2024-07-12 DIAGNOSIS — R1084 Generalized abdominal pain: Secondary | ICD-10-CM | POA: Diagnosis not present

## 2024-07-12 DIAGNOSIS — I700 Atherosclerosis of aorta: Secondary | ICD-10-CM | POA: Insufficient documentation

## 2024-07-12 DIAGNOSIS — E538 Deficiency of other specified B group vitamins: Secondary | ICD-10-CM | POA: Insufficient documentation

## 2024-07-12 DIAGNOSIS — M79631 Pain in right forearm: Secondary | ICD-10-CM | POA: Insufficient documentation

## 2024-07-12 DIAGNOSIS — E038 Other specified hypothyroidism: Secondary | ICD-10-CM

## 2024-07-12 DIAGNOSIS — R109 Unspecified abdominal pain: Secondary | ICD-10-CM | POA: Diagnosis present

## 2024-07-12 DIAGNOSIS — E871 Hypo-osmolality and hyponatremia: Secondary | ICD-10-CM | POA: Diagnosis not present

## 2024-07-12 DIAGNOSIS — K5792 Diverticulitis of intestine, part unspecified, without perforation or abscess without bleeding: Secondary | ICD-10-CM | POA: Diagnosis not present

## 2024-07-12 DIAGNOSIS — K5900 Constipation, unspecified: Principal | ICD-10-CM | POA: Insufficient documentation

## 2024-07-12 DIAGNOSIS — K5909 Other constipation: Secondary | ICD-10-CM

## 2024-07-12 DIAGNOSIS — I10 Essential (primary) hypertension: Secondary | ICD-10-CM | POA: Diagnosis not present

## 2024-07-12 DIAGNOSIS — N390 Urinary tract infection, site not specified: Secondary | ICD-10-CM | POA: Diagnosis present

## 2024-07-12 DIAGNOSIS — K449 Diaphragmatic hernia without obstruction or gangrene: Secondary | ICD-10-CM | POA: Diagnosis not present

## 2024-07-12 DIAGNOSIS — R1013 Epigastric pain: Secondary | ICD-10-CM | POA: Diagnosis not present

## 2024-07-12 DIAGNOSIS — R6510 Systemic inflammatory response syndrome (SIRS) of non-infectious origin without acute organ dysfunction: Secondary | ICD-10-CM | POA: Insufficient documentation

## 2024-07-12 DIAGNOSIS — Z743 Need for continuous supervision: Secondary | ICD-10-CM | POA: Diagnosis not present

## 2024-07-12 DIAGNOSIS — G8929 Other chronic pain: Secondary | ICD-10-CM | POA: Insufficient documentation

## 2024-07-12 DIAGNOSIS — D638 Anemia in other chronic diseases classified elsewhere: Secondary | ICD-10-CM

## 2024-07-12 DIAGNOSIS — K5750 Diverticulosis of both small and large intestine without perforation or abscess without bleeding: Secondary | ICD-10-CM | POA: Diagnosis not present

## 2024-07-12 DIAGNOSIS — Z8673 Personal history of transient ischemic attack (TIA), and cerebral infarction without residual deficits: Secondary | ICD-10-CM | POA: Insufficient documentation

## 2024-07-12 DIAGNOSIS — Z23 Encounter for immunization: Secondary | ICD-10-CM | POA: Diagnosis not present

## 2024-07-12 DIAGNOSIS — R000 Tachycardia, unspecified: Secondary | ICD-10-CM | POA: Diagnosis not present

## 2024-07-12 DIAGNOSIS — J90 Pleural effusion, not elsewhere classified: Secondary | ICD-10-CM | POA: Insufficient documentation

## 2024-07-12 DIAGNOSIS — R52 Pain, unspecified: Secondary | ICD-10-CM | POA: Diagnosis not present

## 2024-07-12 DIAGNOSIS — R9431 Abnormal electrocardiogram [ECG] [EKG]: Secondary | ICD-10-CM | POA: Diagnosis not present

## 2024-07-12 DIAGNOSIS — K5730 Diverticulosis of large intestine without perforation or abscess without bleeding: Secondary | ICD-10-CM | POA: Insufficient documentation

## 2024-07-12 LAB — CBC WITH DIFFERENTIAL/PLATELET
Abs Immature Granulocytes: 0.21 K/uL — ABNORMAL HIGH (ref 0.00–0.07)
Basophils Absolute: 0 K/uL (ref 0.0–0.1)
Basophils Relative: 0 %
Eosinophils Absolute: 0 K/uL (ref 0.0–0.5)
Eosinophils Relative: 0 %
HCT: 34.6 % — ABNORMAL LOW (ref 36.0–46.0)
Hemoglobin: 11.8 g/dL — ABNORMAL LOW (ref 12.0–15.0)
Immature Granulocytes: 1 %
Lymphocytes Relative: 3 %
Lymphs Abs: 0.7 K/uL (ref 0.7–4.0)
MCH: 29.7 pg (ref 26.0–34.0)
MCHC: 34.1 g/dL (ref 30.0–36.0)
MCV: 87.2 fL (ref 80.0–100.0)
Monocytes Absolute: 2.1 K/uL — ABNORMAL HIGH (ref 0.1–1.0)
Monocytes Relative: 10 %
Neutro Abs: 17.6 K/uL — ABNORMAL HIGH (ref 1.7–7.7)
Neutrophils Relative %: 86 %
Platelets: 469 K/uL — ABNORMAL HIGH (ref 150–400)
RBC: 3.97 MIL/uL (ref 3.87–5.11)
RDW: 12.6 % (ref 11.5–15.5)
WBC: 20.7 K/uL — ABNORMAL HIGH (ref 4.0–10.5)
nRBC: 0 % (ref 0.0–0.2)

## 2024-07-12 LAB — LIPASE, BLOOD: Lipase: 24 U/L (ref 11–51)

## 2024-07-12 LAB — URINALYSIS, ROUTINE W REFLEX MICROSCOPIC
Bacteria, UA: NONE SEEN
Bilirubin Urine: NEGATIVE
Glucose, UA: NEGATIVE mg/dL
Ketones, ur: 40 mg/dL — AB
Leukocytes,Ua: NEGATIVE
Nitrite: NEGATIVE
Protein, ur: 30 mg/dL — AB
Specific Gravity, Urine: >1.046 — ABNORMAL HIGH (ref 1.005–1.030)
pH: 6.5 (ref 5.0–8.0)

## 2024-07-12 LAB — RESP PANEL BY RT-PCR (RSV, FLU A&B, COVID)  RVPGX2
Influenza A by PCR: NEGATIVE
Influenza B by PCR: NEGATIVE
Resp Syncytial Virus by PCR: NEGATIVE
SARS Coronavirus 2 by RT PCR: NEGATIVE

## 2024-07-12 LAB — COMPREHENSIVE METABOLIC PANEL WITH GFR
ALT: 18 U/L (ref 0–44)
AST: 26 U/L (ref 15–41)
Albumin: 3.8 g/dL (ref 3.5–5.0)
Alkaline Phosphatase: 113 U/L (ref 38–126)
Anion gap: 18 — ABNORMAL HIGH (ref 5–15)
BUN: 17 mg/dL (ref 8–23)
CO2: 21 mmol/L — ABNORMAL LOW (ref 22–32)
Calcium: 9.5 mg/dL (ref 8.9–10.3)
Chloride: 97 mmol/L — ABNORMAL LOW (ref 98–111)
Creatinine, Ser: 0.7 mg/dL (ref 0.44–1.00)
GFR, Estimated: >60 mL/min (ref >60)
Glucose, Bld: 114 mg/dL — ABNORMAL HIGH (ref 70–99)
Potassium: 3.1 mmol/L — ABNORMAL LOW (ref 3.5–5.1)
Sodium: 136 mmol/L (ref 135–145)
Total Bilirubin: 1 mg/dL (ref 0.0–1.2)
Total Protein: 7.8 g/dL (ref 6.5–8.1)

## 2024-07-12 LAB — LACTIC ACID, PLASMA: Lactic Acid, Venous: 0.9 mmol/L (ref 0.5–1.9)

## 2024-07-12 LAB — MAGNESIUM: Magnesium: 2 mg/dL (ref 1.7–2.4)

## 2024-07-12 MED ORDER — PANTOPRAZOLE SODIUM 40 MG PO TBEC
40.0000 mg | DELAYED_RELEASE_TABLET | Freq: Two times a day (BID) | ORAL | Status: DC
  Administered 2024-07-13 – 2024-07-18 (×11): 40 mg via ORAL
  Filled 2024-07-12 (×11): qty 1

## 2024-07-12 MED ORDER — POLYETHYLENE GLYCOL 3350 17 G PO PACK
17.0000 g | PACK | Freq: Every day | ORAL | Status: DC | PRN
  Administered 2024-07-13: 17 g via ORAL
  Filled 2024-07-12: qty 1

## 2024-07-12 MED ORDER — METRONIDAZOLE 500 MG/100ML IV SOLN
500.0000 mg | Freq: Once | INTRAVENOUS | Status: AC
  Administered 2024-07-12: 500 mg via INTRAVENOUS
  Filled 2024-07-12: qty 100

## 2024-07-12 MED ORDER — METOPROLOL SUCCINATE ER 50 MG PO TB24
50.0000 mg | ORAL_TABLET | Freq: Every day | ORAL | Status: DC
  Administered 2024-07-13 – 2024-07-17 (×5): 50 mg via ORAL
  Filled 2024-07-12 (×5): qty 1

## 2024-07-12 MED ORDER — ACETAMINOPHEN 650 MG RE SUPP: 650.0000 mg | Freq: Four times a day (QID) | RECTAL | Status: DC | PRN

## 2024-07-12 MED ORDER — ENOXAPARIN SODIUM 40 MG/0.4ML IJ SOSY: 40.0000 mg | PREFILLED_SYRINGE | INTRAMUSCULAR | Status: DC

## 2024-07-12 MED ORDER — ATORVASTATIN CALCIUM 40 MG PO TABS
80.0000 mg | ORAL_TABLET | Freq: Every day | ORAL | Status: DC
Start: 2024-07-13 — End: 2024-07-18
  Administered 2024-07-13 – 2024-07-18 (×6): 80 mg via ORAL
  Filled 2024-07-12 (×6): qty 2

## 2024-07-12 MED ORDER — AMLODIPINE BESYLATE 5 MG PO TABS
5.0000 mg | ORAL_TABLET | Freq: Every evening | ORAL | Status: DC
  Administered 2024-07-13 – 2024-07-17 (×5): 5 mg via ORAL
  Filled 2024-07-12 (×5): qty 1

## 2024-07-12 MED ORDER — MORPHINE SULFATE (PF) 4 MG/ML IV SOLN
4.0000 mg | Freq: Once | INTRAVENOUS | Status: AC
  Administered 2024-07-12: 4 mg via INTRAVENOUS
  Filled 2024-07-12: qty 1

## 2024-07-12 MED ORDER — ONDANSETRON HCL 4 MG/2ML IJ SOLN
4.0000 mg | Freq: Once | INTRAMUSCULAR | Status: AC
  Administered 2024-07-12: 4 mg via INTRAVENOUS
  Filled 2024-07-12: qty 2

## 2024-07-12 MED ORDER — SODIUM CHLORIDE 0.9 % IV SOLN
1.0000 g | INTRAVENOUS | Status: AC
  Administered 2024-07-13 – 2024-07-17 (×5): 1 g via INTRAVENOUS
  Filled 2024-07-12 (×5): qty 10

## 2024-07-12 MED ORDER — IOHEXOL 300 MG/ML  SOLN
100.0000 mL | Freq: Once | INTRAMUSCULAR | Status: AC | PRN
  Administered 2024-07-12: 100 mL via INTRAVENOUS

## 2024-07-12 MED ORDER — ONDANSETRON HCL 4 MG PO TABS: 4.0000 mg | ORAL_TABLET | Freq: Four times a day (QID) | ORAL | Status: DC | PRN

## 2024-07-12 MED ORDER — RIVAROXABAN 15 MG PO TABS
15.0000 mg | ORAL_TABLET | Freq: Every day | ORAL | Status: DC
Start: 2024-07-12 — End: 2024-07-13
  Administered 2024-07-12: 15 mg via ORAL
  Filled 2024-07-12: qty 1

## 2024-07-12 MED ORDER — HYDROMORPHONE HCL 1 MG/ML IJ SOLN
0.5000 mg | INTRAMUSCULAR | Status: DC | PRN
  Administered 2024-07-12 – 2024-07-15 (×7): 0.5 mg via INTRAVENOUS
  Filled 2024-07-12 (×7): qty 0.5

## 2024-07-12 MED ORDER — ACETAMINOPHEN 325 MG PO TABS
650.0000 mg | ORAL_TABLET | Freq: Four times a day (QID) | ORAL | Status: DC | PRN
  Administered 2024-07-15 – 2024-07-16 (×3): 650 mg via ORAL
  Filled 2024-07-12 (×3): qty 2

## 2024-07-12 MED ORDER — ONDANSETRON HCL 4 MG/2ML IJ SOLN: 4.0000 mg | Freq: Four times a day (QID) | INTRAMUSCULAR | Status: DC | PRN

## 2024-07-12 MED ORDER — METHOCARBAMOL 500 MG PO TABS
500.0000 mg | ORAL_TABLET | Freq: Four times a day (QID) | ORAL | Status: DC | PRN
  Administered 2024-07-13 – 2024-07-17 (×11): 500 mg via ORAL
  Filled 2024-07-12 (×12): qty 1

## 2024-07-12 MED ORDER — POTASSIUM CHLORIDE CRYS ER 20 MEQ PO TBCR
40.0000 meq | EXTENDED_RELEASE_TABLET | Freq: Once | ORAL | Status: AC
  Administered 2024-07-12: 40 meq via ORAL
  Filled 2024-07-12: qty 2

## 2024-07-12 MED ORDER — OXYCODONE HCL 5 MG PO TABS
5.0000 mg | ORAL_TABLET | ORAL | Status: DC | PRN
  Administered 2024-07-13 – 2024-07-16 (×6): 5 mg via ORAL
  Filled 2024-07-12 (×7): qty 1

## 2024-07-12 MED ORDER — LEVOTHYROXINE SODIUM 50 MCG PO TABS
50.0000 ug | ORAL_TABLET | Freq: Every day | ORAL | Status: DC
  Administered 2024-07-13 – 2024-07-18 (×6): 50 ug via ORAL
  Filled 2024-07-12 (×6): qty 1

## 2024-07-12 MED ORDER — TEMAZEPAM 7.5 MG PO CAPS
7.5000 mg | ORAL_CAPSULE | Freq: Every evening | ORAL | Status: DC | PRN
  Administered 2024-07-14 – 2024-07-17 (×4): 7.5 mg via ORAL
  Filled 2024-07-12 (×4): qty 1

## 2024-07-12 MED ORDER — SODIUM CHLORIDE 0.9% FLUSH
3.0000 mL | Freq: Two times a day (BID) | INTRAVENOUS | Status: DC
  Administered 2024-07-12 – 2024-07-18 (×10): 3 mL via INTRAVENOUS

## 2024-07-12 MED ORDER — SODIUM CHLORIDE 0.9 % IV SOLN
1.0000 g | Freq: Once | INTRAVENOUS | Status: AC
  Administered 2024-07-12: 1 g via INTRAVENOUS
  Filled 2024-07-12: qty 10

## 2024-07-12 MED ORDER — SODIUM CHLORIDE 0.9 % IV BOLUS
1000.0000 mL | Freq: Once | INTRAVENOUS | Status: AC
  Administered 2024-07-12: 1000 mL via INTRAVENOUS

## 2024-07-12 NOTE — ED Triage Notes (Signed)
 Pt bib wheelchair, c/o severe back pain and lower abd pain, upper abd pain. Currently taking muscle relaxers, out of meds. Also reports constipation x 1 week

## 2024-07-12 NOTE — ED Provider Notes (Signed)
 [place] EMERGENCY DEPARTMENT AT [facility] Provider Note   CSN: [id number] Arrival date & time: 07/12/24  1027     Patient presents with: Abdominal Pain   Patricia Peck is a 78 y.o. female.   Patient with past medical history of arthritis, asthma, A-fib/atrial flutter on Xarelto , depression, GERD, HLD, HTN, hypothyroidism, osteoporosis, TIA (2017), hysterectomy, moderate-sized hiatal hernia with severe heartburn and indigestion (current Nexium  40mg  daily, famotidine  40mg  at night, sucralfate  prn) --presents today for evaluation of abdominal pain.  Patient reports pain in the mid abdomen which radiates up into her stomach area.  This has been associated with constipation for 5 days.  Patient had similar symptoms in May when she was seen in the emergency department.  She had a CT at that time that showed a liver lesion but no definite acute causes for her pain.  She was prescribed magnesium  citrate, tizanidine , and told to take MiraLAX .  She seemed to have improvement with this treatment.  She reports current symptoms similar to previous.  She is currently out of muscle relaxant medication.  No urinary symptoms.  No vomiting.  No blood noted in stool.  Patient had barium swallow performed about 1 week ago.       Prior to Admission medications   Medication Sig Start Date End Date Taking? Authorizing Provider  Alpha-D-Galactosidase (BEANO PO) Take 1 tablet by mouth 2 (two) times daily.    [provider]  amLODipine  (NORVASC ) 5 MG tablet Take 1 tablet (5 mg total) by mouth every evening. 05/05/24   Nche, Roselie Rockford, NP  atorvastatin  (LIPITOR) 80 MG tablet Take 1 tablet (80 mg total) by mouth daily. 05/17/24   Pietro Redell GORMAN, MD  Benzocaine -Resorcinol (ANTI-ITCH VAGINAL) 20-3 % CREA     [provider]  bumetanide  (BUMEX ) 1 MG tablet Take 1 tablet (1 mg total) by mouth daily as needed. For leg and ankle swelling. Take with potassium chloride  10mEq 05/05/24    Nche, Roselie Rockford, NP  Calcium  Carbonate-Vitamin D  600-400 MG-UNIT tablet Take 1 tablet by mouth 2 (two) times daily.    [provider]  Cholecalciferol (VITAMIN D -3) 5000 UNITS TABS Take 5,000 Units by mouth 3 (three) times a week. Tuesday Thursday Saturday    [provider]  COMIRNATY syringe  07/03/23   [provider]  Cranberry 450 MG CAPS Take 2 capsules by mouth daily.    [provider]  denosumab  (PROLIA ) 60 MG/ML SOLN injection Inject 60 mg into the skin once. Administer in upper arm, thigh, or abdomen 08/06/15   Aron, Talia M, MD  denosumab  (PROLIA ) 60 MG/ML SOSY injection Inject 60 mg into the skin once for 1 dose. 08/11/24 08/11/24  Nche, Roselie Rockford, NP  estradiol (ESTRACE) 0.1 MG/GM vaginal cream Place 1 Applicatorful vaginally 3 (three) times a week. 01/11/21   [provider]  ezetimibe  (ZETIA ) 10 MG tablet Take 1 tablet (10 mg total) by mouth daily. 08/12/23   Pietro Redell GORMAN, MD  famotidine  (PEPCID ) 40 MG tablet TAKE 1 TABLET(40 MG) BY MOUTH AT BEDTIME 07/05/24   Sebastian Beverley NOVAK, MD  levothyroxine  (SYNTHROID ) 50 MCG tablet Take 1 tablet (50 mcg total) by mouth daily before breakfast. 05/09/24   Nche, Roselie Rockford, NP  loratadine  (CLARITIN ) 10 MG tablet Take 10 mg by mouth daily.    [provider]  Meth-Hyo-M Bl-Na Phos-Ph Sal (URO-MP) 118 MG CAPS SMARTSIG:2 Capsule(s) By Mouth Morning-Night    [provider]  metoprolol   succinate (TOPROL -XL) 50 MG 24 hr tablet TAKE 1 TABLET(50 MG) BY MOUTH DAILY 06/15/24   Pietro Redell RAMAN, MD  montelukast  (SINGULAIR ) 10 MG tablet TAKE 1 TABLET(10 MG) BY MOUTH AT BEDTIME 11/17/23   Nche, Roselie Rockford, NP  pantoprazole  (PROTONIX ) 40 MG tablet Take 1 tablet (40 mg total) by mouth 2 (two) times daily before a meal. 05/05/24   Nche, Roselie Rockford, NP  polyethylene glycol powder (GLYCOLAX /MIRALAX ) 17 GM/SCOOP powder Take 17 g by mouth daily. 03/24/24   Sebastian Beverley NOVAK, MD  potassium  chloride (KLOR-CON ) 10 MEQ tablet Take 1 tablet (10 mEq total) by mouth daily as needed. For leg and ankle swelling. Take with bumex  1mg  05/05/24   Nche, Roselie Rockford, NP  temazepam  (RESTORIL ) 7.5 MG capsule TAKE 1 CAPSULE(7.5 MG) BY MOUTH AT BEDTIME 06/07/24   Nche, Roselie Rockford, NP  trimethoprim  (TRIMPEX ) 100 MG tablet Take 1 tablet (100 mg total) by mouth daily. 12/01/22   Gaston Hamilton, MD  vitamin E 180 MG (400 UNITS) capsule Take 400 Units by mouth 3 (three) times a week. Tuesdays, Thursdays, and Saturdays    [provider]  XARELTO  15 MG TABS tablet TAKE 1 TABLET(15 MG) BY MOUTH DAILY WITH SUPPER 05/10/24   Pietro Redell RAMAN, MD    Allergies: Patient has no known allergies.    Review of Systems  Updated Vital Signs BP (!) 143/93 (BP Location: Right Arm)   Pulse (!) 135   Temp 97.7 F (36.5 C) (Oral)   Resp 18   SpO2 94%   Physical Exam Vitals and nursing note reviewed.  Constitutional:      General: She is not in acute distress.    Appearance: She is well-developed.  HENT:     Head: Normocephalic and atraumatic.     Right Ear: External ear normal.     Left Ear: External ear normal.     Nose: Nose normal.  Eyes:     Conjunctiva/sclera: Conjunctivae normal.  Cardiovascular:     Rate and Rhythm: Normal rate and regular rhythm.     Heart sounds: No murmur heard. Pulmonary:     Effort: No respiratory distress.     Breath sounds: No wheezing, rhonchi or rales.  Abdominal:     Palpations: Abdomen is soft.     Tenderness: There is abdominal tenderness (Mild tenderness, no rebound or guarding) in the epigastric area and periumbilical area. There is no guarding or rebound. Negative signs include Murphy's sign and McBurney's sign.  Musculoskeletal:     Cervical back: Normal, normal range of motion and neck supple.     Thoracic back: Tenderness present. Normal range of motion.     Lumbar back: No tenderness. Normal range of motion.       Back:     Right lower leg: No  edema.     Left lower leg: No edema.     Comments: Tender to palpation, mid back, paraspinous  Skin:    General: Skin is warm and dry.     Findings: No rash.     Comments: No skin rashes noted extremities, torso  Neurological:     General: No focal deficit present.     Mental Status: She is alert. Mental status is at baseline.     Motor: No weakness.  Psychiatric:        Mood and Affect: Mood normal.     (all labs ordered are listed, but only abnormal results are displayed) Labs Reviewed  CBC WITH  DIFFERENTIAL/PLATELET - Abnormal; Notable for the following components:      Result Value   WBC 20.7 (*)    Hemoglobin 11.8 (*)    HCT 34.6 (*)    Platelets 469 (*)    Neutro Abs 17.6 (*)    Monocytes Absolute 2.1 (*)    Abs Immature Granulocytes 0.21 (*)    All other components within normal limits  COMPREHENSIVE METABOLIC PANEL WITH GFR - Abnormal; Notable for the following components:   Potassium 3.1 (*)    Chloride 97 (*)    CO2 21 (*)    Glucose, Bld 114 (*)    Anion gap 18 (*)    All other components within normal limits  URINALYSIS, ROUTINE W REFLEX MICROSCOPIC - Abnormal; Notable for the following components:   Specific Gravity, Urine >1.046 (*)    Hgb urine dipstick TRACE (*)    Ketones, ur 40 (*)    Protein, ur 30 (*)    All other components within normal limits  RESP PANEL BY RT-PCR (RSV, FLU A&B, COVID)  RVPGX2  URINE CULTURE  CULTURE, BLOOD (ROUTINE X 2)  CULTURE, BLOOD (ROUTINE X 2)  LIPASE, BLOOD  LACTIC ACID, PLASMA  MAGNESIUM     ED ECG REPORT   Date: 07/12/2024  Rate: 97  Rhythm: atrial fibrillation  QRS Axis: normal  Intervals: normal  ST/T Wave abnormalities: normal  Conduction Disutrbances:none  Narrative Interpretation:   Old EKG Reviewed: unchanged but somewhat faster today than previous  I have personally reviewed the EKG tracing and agree with the computerized printout as noted.   Radiology: CT ABDOMEN PELVIS W CONTRAST Result Date:  07/12/2024 CLINICAL DATA:  Abdominal pain, acute, nonlocalized abd pain, constipation EXAM: CT ABDOMEN AND PELVIS WITH CONTRAST TECHNIQUE: Multidetector CT imaging of the abdomen and pelvis was performed using the standard protocol following bolus administration of intravenous contrast. RADIATION DOSE REDUCTION: This exam was performed according to the departmental dose-optimization program which includes automated exposure control, adjustment of the mA and/or kV according to patient size and/or use of iterative reconstruction technique. CONTRAST:  OMNIPAQUE  IOHEXOL  300 MG/ML  SOLN COMPARISON:  07/05/2024, 03/15/2024 FINDINGS: Lower chest: Trace bilateral pleural effusions with bibasilar atelectasis. Fibrolinear scarring in the right middle lobe. Hepatobiliary: No mass.Scattered subcentimeter hepatic hypodensities, too small to definitively characterize, but likely small cysts or biliary hematomas.No radiopaque stones or wall thickening of the gallbladder. No intrahepatic or extrahepatic biliary ductal dilation. The portal veins are patent. Pancreas: No mass or main ductal dilation. No peripancreatic inflammation or fluid collection. Spleen: Normal size. No mass. Adrenals/Urinary Tract: No adrenal masses. No renal mass. Subcentimeter hypodensities are noted in the kidneys, too small to definitively characterize, but likely small cysts. Subtle urothelial enhancement noted in the left renal pelvis. No renal stones or hydronephrosis. The urinary bladder is distended without focal abnormality. Stomach/Bowel: Moderate-sized sliding-type hiatal hernia. 3.4 cm periampullary duodenum diverticulum. The stomach is decompressed without focal abnormality. No small bowel wall thickening or inflammation. No small bowel obstruction.The appendix was not visualized. No right lower quadrant or pericecal inflammatory changes to suggest acute appendicitis. Sigmoid colonic diverticulosis. No changes of acute diverticulitis. Dense  contrast filling the colon. Minimal stool in the ascending colon. Vascular/Lymphatic: No aortic aneurysm. Diffuse aortoiliac atherosclerosis. No intraabdominal or pelvic lymphadenopathy. Reproductive: Hysterectomy. No concerning adnexal mass.No free pelvic fluid. Other: No pneumoperitoneum, ascites, or mesenteric inflammation. Musculoskeletal: No acute fracture or destructive lesion. Multilevel degenerative disc disease of the spine. Unchanged, moderate, grade 2 anterolisthesis of L5  on S1 with bilateral L5 pars interarticularis defects. IMPRESSION: 1. Subtle urothelial enhancement noted in the left renal pelvis, worrisome for an ascending urinary tract infection. Correlation with urinalysis recommended for further characterization. No hydronephrosis or nephrolithiasis. 2. Moderate-sized sliding-type hiatal hernia. Periampullary duodenum and colonic diverticulosis. No changes of acute diverticulitis. 3. The colon is full of hyperdense contrast material. Minimal stool noted in the ascending colon. 4. Trace bilateral pleural effusions. Aortic Atherosclerosis (ICD10-I70.0). Electronically Signed   By: Rogelia Myers M.D.   On: 07/12/2024 14:04     Procedures   Medications Ordered in the ED  cefTRIAXone  (ROCEPHIN ) 1 g in sodium chloride  0.9 % 100 mL IVPB (1 g Intravenous New Bag/Given 07/12/24 1640)  metroNIDAZOLE  (FLAGYL ) IVPB 500 mg (has no administration in time range)  morphine  (PF) 4 MG/ML injection 4 mg (4 mg Intravenous Given 07/12/24 1250)  ondansetron  (ZOFRAN ) injection 4 mg (4 mg Intravenous Given 07/12/24 1250)  iohexol  (OMNIPAQUE ) 300 MG/ML solution 100 mL (100 mLs Intravenous Contrast Given 07/12/24 1316)  potassium chloride  SA (KLOR-CON  M) CR tablet 40 mEq (40 mEq Oral Given 07/12/24 1424)  sodium chloride  0.9 % bolus 1,000 mL (1,000 mLs Intravenous New Bag/Given 07/12/24 1552)  morphine  (PF) 4 MG/ML injection 4 mg (4 mg Intravenous Given 07/12/24 1634)   ED Course  Patient seen and examined. History  obtained directly from patient.   Labs/EKG: Ordered CBC, CMP, lipase, UA.  Imaging: Ordered 1 view abd, but changed to CT abd/pelvis after WBC returned elevated.  Medications/Fluids: Ordered: morphine  4mg , zofran  4mg , fluid bolus.   Most recent vital signs reviewed and are as follows: BP (!) 153/92   Pulse 99   Temp 97.9 F (36.6 C) (Oral)   Resp (!) 21   SpO2 100%   Initial impression: abd pain, back pain, ? constipation  3:43 PM Reassessment performed. Patient appears stable on several rechecks.   Labs personally reviewed and interpreted including: CBC elevated WBC 20.7, hemoglobin 11.8; CMP hypokalemia 3.1 oral repletion given; lipase normal; UA concentrated but no evidence of infection.  Given unknown known source of elevated white blood cell count, added lactate, urine culture.  Imaging personally visualized and interpreted including: CT abdomen pelvis, stool noted throughout colon, no definitive signs of infection, radiology report notes subtle urothelial enhancement noted left renal pelvis concerning for ascending UTI, however urine testing less concerning.   Reviewed pertinent lab work and imaging with patient at bedside. Questions answered.   Most current vital signs reviewed and are as follows: BP (!) 153/92   Pulse 99   Temp 97.9 F (36.6 C) (Oral)   Resp (!) 21   SpO2 100%   Plan: Discussed with Dr. Pamella who will see. Awaiting results of additional testing.   4:49 PM Reassessment performed. Patient appears stable, but requesting additional pain medication.  Additional medication ordered.  Labs personally reviewed and interpreted including: Lactate was normal, magnesium  is normal, viral panel was negative.  Imaging personally visualized and interpreted including: Chest x-ray agree negative  Reviewed pertinent lab work and imaging with patient at bedside. Questions answered.   Most current vital signs reviewed and are as follows: BP (!) 153/92   Pulse 99    Temp 97.9 F (36.6 C) (Oral)   Resp (!) 21   SpO2 100%   Plan: Admit to hospital 2/2 SIRS, unclear etiology.   5:15 PM Discussed with [facility], Dr. Jillian, who accepts for admission.   CRITICAL CARE Performed by: Fonda Ruby PA-C Total critical care  time: 50 minutes Critical care time was exclusive of separately billable procedures and treating other patients. Critical care was necessary to treat or prevent imminent or life-threatening deterioration. Critical care was time spent personally by me on the following activities: development of treatment plan with patient and/or surrogate as well as nursing, discussions with consultants, evaluation of patient's response to treatment, examination of patient, obtaining history from patient or surrogate, ordering and performing treatments and interventions, ordering and review of laboratory studies, ordering and review of radiographic studies, pulse oximetry and re-evaluation of patient's condition.                                    Medical Decision Making Amount and/or Complexity of Data Reviewed Labs: ordered. Radiology: ordered.  Risk Prescription drug management.   For this patient's complaint of abdominal pain, the following conditions were considered on the differential diagnosis: gastritis/PUD, enteritis/duodenitis, appendicitis, cholelithiasis/cholecystitis, cholangitis, pancreatitis, ruptured viscus, colitis, diverticulitis, small/large bowel obstruction, proctitis, cystitis, pyelonephritis, ureteral colic, aortic dissection, aortic aneurysm. In women, pelvic inflammatory disease, ovarian cysts, and tubo-ovarian abscess were also considered. Atypical chest etiologies were also considered including ACS, PE, and pneumonia.  Overall exam reassuring, however patient has SIRS criteria with elevated white blood cell count to 20,000, elevated heart rate above 100.  Question of a ascending UTI on CT scan however UA does not  suggest UTI.  Culture pending.  Lactate is fortunately normal.  Patient was hypokalemic with normal magnesium .  Oral repletion given.  Given concerns for SIRS criteria with unclear source, patient will be admitted for further observation, pending culture results and reexam.        Final diagnoses:  Epigastric pain  Systemic inflammatory response syndrome (SIRS) (HCC)  Leukocytosis, unspecified type    ED Discharge Orders     None          Desiderio Chew, PA-C 07/12/24 1719    Pamella Sharper A, DO 07/21/24 [phone]

## 2024-07-12 NOTE — ED Notes (Signed)
 [facility] at bedside to transport pt to [facility]

## 2024-07-12 NOTE — ED Notes (Signed)
 Patricia Peck at [facility] for transport

## 2024-07-12 NOTE — ED Notes (Signed)
 Patient currently unable to provide urine specimen. Will continue to monitor output. Plan to reassess later. PUREWICK inserted.

## 2024-07-12 NOTE — Plan of Care (Signed)
 Patient is a 78 year old female with history of arthritis, asthma, paroxysmal A-fib, depression, GERD, hypertension, hyperlipidemia, hypothyroidism, hiatal hernia who presented from home with complaint of back pain, lower abdominal pain, lack of bowel movement for a week.  On presentation ,she was in A-fib with rate of low 100s, she is compliant with her Eliquis.  She was also hypertensive.  Lb work showed  potassium 3.1, WC count of 20.7.  UA was not suspicious for UTI but CT abdomen/pelvis showed urothelial enhancement noted in the left renal pelvis, worrisome for an ascending urinary tract infection.  Patient requested to be admitted for further management of suspected UTI.  Started on antibiotics

## 2024-07-12 NOTE — H&P (Signed)
 History and Physical    Patricia Peck FMW:[id number] DOB: 1945-12-12 DOA: 07/12/2024  PCP: Katheen Roselie Rockford, NP   Patient coming from: Home   Chief Complaint: Back pain, abdominal pain   HPI: Patricia Peck is a 78 y.o. female with medical history significant for hypertension, hyperlipidemia, history of TIA, PAF on Xarelto , GERD, chronic abdominal pain, and chronic back pain who presents with worsening in her abdominal and back pain.  Patient reports that she has been experiencing generalized abdominal pain and back pain for many years.  Pain has been worsening recently, was better when she was taking a muscle relaxant, but she ran out of the muscle relaxer and pain has been uncontrolled.  Pain is in the same location and of the same character as her chronic pain, severity has increased.  Abdominal pain is generalized and her back pain involves the low back in the middle and along both sides of her spine.  She denies any associated fevers, chills, or dysuria.  She follows with [facility] and is being considered for esophageal dilation.  She denies any dysphagia at this time.  [facility] ED Course: Upon arrival to the ED, patient is found to be afebrile and saturating well on room air with transient tachypnea and tachycardia, and stable blood pressure.  There is no acute finding on chest x-ray.  CT of the abdomen and pelvis is notable for subtle urothelial enhancement in the left renal pelvis, hiatal hernia, and minimal stool.  Labs are most notable for potassium 3.1, normal creatinine, WBC 20,700, platelets 269,000, normal lactic acid, normal LFTs, and normal lipase.  Blood and urine cultures were collected, patient was given a liter of normal saline, 40 mill equivalents oral potassium, Zofran , Rocephin , Flagyl , and 3 doses of morphine , and she was transferred to [facility] for admission.  Review of Systems:  All other systems reviewed and apart from HPI, are  negative.  Past Medical History:  Diagnosis Date   Allergy    Arthritis    hands   Asthma    uses an inhaler   Atrial fibrillation (HCC)    Atrial flutter (HCC)    Depression    Dysrhythmia    a-fib, on Xarelto    GERD (gastroesophageal reflux disease)    Hyperlipidemia    Hypertension    Hypotension 03/24/2024   Hypothyroidism    Osteoporosis    Stroke (HCC) 2017   TIA   TIA (transient ischemic attack)     Past Surgical History:  Procedure Laterality Date   ABDOMINAL HYSTERECTOMY     COLONOSCOPY     MYRINGOTOMY WITH TUBE PLACEMENT Right 01/14/2016   Procedure: RIGHT MYRINGOTOMY WITH T-TUBE PLACEMENT;  Surgeon: Daniel Moccasin, MD;  Location: [facility];  Service: ENT;  Laterality: Right;   PARTIAL HYSTERECTOMY  1989   ROBOTIC ASSISTED BILATERAL SALPINGO OOPHERECTOMY Bilateral 02/14/2021   Procedure: XI ROBOTIC ASSISTED BILATERAL SALPINGO OOPHORECTOMY;  Surgeon: Eloy Herring, MD;  Location: [facility];  Service: Gynecology;  Laterality: Bilateral;   TEE WITHOUT CARDIOVERSION N/A 12/22/2012   Procedure: TRANSESOPHAGEAL ECHOCARDIOGRAM (TEE);  Surgeon: Redell GORMAN Shallow, MD;  Location: [facility];  Service: Cardiovascular;  Laterality: N/A;    Social History:   reports that she has never smoked. She has never been exposed to tobacco smoke. She has never used smokeless tobacco. She reports that she does not currently use alcohol. She reports that she does not use drugs.  No Known Allergies  Family History  Problem Relation Age of Onset   Breast cancer Mother    Drug abuse Son    Depression Son    Stroke Paternal Aunt    Heart disease Paternal Grandfather    Diabetes Maternal Grandfather    Colon cancer Neg Hx    Ovarian cancer Neg Hx    Endometrial cancer Neg Hx    Pancreatic cancer Neg Hx    Prostate cancer Neg Hx      Prior to Admission medications   Medication Sig Start Date End Date Taking? Authorizing Provider  Alpha-D-Galactosidase (BEANO PO) Take 1  tablet by mouth 2 (two) times daily.   Yes [provider]  amLODipine  (NORVASC ) 5 MG tablet Take 1 tablet (5 mg total) by mouth every evening. 05/05/24  Yes Nche, Roselie Rockford, NP  atorvastatin  (LIPITOR) 80 MG tablet Take 1 tablet (80 mg total) by mouth daily. 05/17/24  Yes Pietro Redell RAMAN, MD  Benzocaine -Resorcinol (ANTI-ITCH VAGINAL) 20-3 % CREA    Yes [provider]  bumetanide  (BUMEX ) 1 MG tablet Take 1 tablet (1 mg total) by mouth daily as needed. For leg and ankle swelling. Take with potassium chloride  10mEq 05/05/24  Yes Nche, Charlotte Lum, NP  Calcium  Carbonate-Vitamin D  600-400 MG-UNIT tablet Take 1 tablet by mouth 2 (two) times daily.   Yes [provider]  Cholecalciferol (VITAMIN D -3) 5000 UNITS TABS Take 5,000 Units by mouth 3 (three) times a week. Tuesday Thursday Saturday   Yes [provider]  Cranberry 450 MG CAPS Take 2 capsules by mouth daily.   Yes [provider]  estradiol (ESTRACE) 0.1 MG/GM vaginal cream Place 1 Applicatorful vaginally 3 (three) times a week. 01/11/21  Yes [provider]  ezetimibe  (ZETIA ) 10 MG tablet Take 1 tablet (10 mg total) by mouth daily. 08/12/23  Yes Pietro Redell RAMAN, MD  famotidine  (PEPCID ) 40 MG tablet TAKE 1 TABLET(40 MG) BY MOUTH AT BEDTIME 07/05/24  Yes Sebastian Beverley NOVAK, MD  levothyroxine  (SYNTHROID ) 50 MCG tablet Take 1 tablet (50 mcg total) by mouth daily before breakfast. 05/09/24  Yes Nche, Roselie Rockford, NP  loratadine  (CLARITIN ) 10 MG tablet Take 10 mg by mouth daily.   Yes [provider]  Meth-Hyo-M Bl-Na Phos-Ph Sal (URO-MP) 118 MG CAPS SMARTSIG:2 Capsule(s) By Mouth Morning-Night   Yes [provider]  metoprolol  succinate (TOPROL -XL) 50 MG 24 hr tablet TAKE 1 TABLET(50 MG) BY MOUTH DAILY 06/15/24  Yes Pietro Redell RAMAN, MD  montelukast  (SINGULAIR ) 10 MG tablet TAKE 1 TABLET(10 MG) BY MOUTH AT BEDTIME 11/17/23  Yes Nche, Roselie Rockford, NP  nitrofurantoin  (MACRODANTIN )  100 MG capsule Take 100 mg by mouth daily.   Yes [provider]  pantoprazole  (PROTONIX ) 40 MG tablet Take 1 tablet (40 mg total) by mouth 2 (two) times daily before a meal. 05/05/24  Yes Nche, Roselie Rockford, NP  polyethylene glycol powder (GLYCOLAX /MIRALAX ) 17 GM/SCOOP powder Take 17 g by mouth daily. 03/24/24  Yes Sebastian Beverley NOVAK, MD  potassium chloride  (KLOR-CON ) 10 MEQ tablet Take 1 tablet (10 mEq total) by mouth daily as needed. For leg and ankle swelling. Take with bumex  1mg  05/05/24  Yes Nche, Roselie Rockford, NP  temazepam  (RESTORIL ) 7.5 MG capsule TAKE 1 CAPSULE(7.5 MG) BY MOUTH AT BEDTIME 06/07/24  Yes Nche, Roselie Rockford, NP  vitamin E 180 MG (400 UNITS) capsule Take 400 Units by mouth 3 (three) times a week. Tuesdays, Thursdays, and Saturdays   Yes [provider]  XARELTO  15 MG TABS  tablet TAKE 1 TABLET(15 MG) BY MOUTH DAILY WITH SUPPER 05/10/24  Yes Crenshaw, Redell RAMAN, MD  denosumab  (PROLIA ) 60 MG/ML SOLN injection Inject 60 mg into the skin once. Administer in upper arm, thigh, or abdomen 08/06/15   Aron, Talia M, MD  denosumab  (PROLIA ) 60 MG/ML SOSY injection Inject 60 mg into the skin once for 1 dose. 08/11/24 08/11/24  Nche, Roselie Rockford, NP  trimethoprim  (TRIMPEX ) 100 MG tablet Take 1 tablet (100 mg total) by mouth daily. 12/01/22   Gaston Hamilton, MD    Physical Exam: Vitals:   07/12/24 1530 07/12/24 1815 07/12/24 1921 07/12/24 1953  BP: (!) 145/86 128/80 132/89   Pulse: (!) 103 97 64   Resp: 19 19 20    Temp:   98.3 F (36.8 C)   TempSrc:   Oral   SpO2: 98% 100% 96%   Height:    5' 4 (1.626 m)    Constitutional: NAD, no pallor or diaphoresis   Eyes: PERTLA, lids and conjunctivae normal ENMT: Mucous membranes are moist. Posterior pharynx clear of any exudate or lesions.   Neck: supple, no masses  Respiratory: no wheezing, no crackles. No accessory muscle use.  Cardiovascular: Rate ~100 and irregularly irregular. No extremity edema.  Abdomen: Soft, no  guarding. Bowel sounds active.  Musculoskeletal: no clubbing / cyanosis. No joint deformity upper and lower extremities.   Skin: no significant rashes, lesions, ulcers. Warm, dry, well-perfused. Neurologic: CN 2-12 grossly intact. Moving all extremities. Alert and oriented.  Psychiatric: calm. Cooperative.    Labs and Imaging on Admission: I have personally reviewed following labs and imaging studies  CBC: Recent Labs  Lab 07/12/24 1125  WBC 20.7*  NEUTROABS 17.6*  HGB 11.8*  HCT 34.6*  MCV 87.2  PLT 469*   Basic Metabolic Panel: Recent Labs  Lab 07/12/24 1125  NA 136  K 3.1*  CL 97*  CO2 21*  GLUCOSE 114*  BUN 17  CREATININE 0.70  CALCIUM  9.5  MG 2.0   GFR: CrCl cannot be calculated (Unknown ideal weight.). Liver Function Tests: Recent Labs  Lab 07/12/24 1125  AST 26  ALT 18  ALKPHOS 113  BILITOT 1.0  PROT 7.8  ALBUMIN 3.8   Recent Labs  Lab 07/12/24 1125  LIPASE 24   No results for input(s): AMMONIA in the last 168 hours. Coagulation Profile: No results for input(s): INR, PROTIME in the last 168 hours. Cardiac Enzymes: No results for input(s): CKTOTAL, CKMB, CKMBINDEX, TROPONINI in the last 168 hours. BNP (last 3 results) No results for input(s): PROBNP in the last 8760 hours. HbA1C: No results for input(s): HGBA1C in the last 72 hours. CBG: No results for input(s): GLUCAP in the last 168 hours. Lipid Profile: No results for input(s): CHOL, HDL, LDLCALC, TRIG, CHOLHDL, LDLDIRECT in the last 72 hours. Thyroid  Function Tests: No results for input(s): TSH, T4TOTAL, FREET4, T3FREE, THYROIDAB in the last 72 hours. Anemia Panel: No results for input(s): VITAMINB12, FOLATE, FERRITIN, TIBC, IRON, RETICCTPCT in the last 72 hours. Urine analysis:    Component Value Date/Time   COLORURINE YELLOW 07/12/2024 1448   APPEARANCEUR CLEAR 07/12/2024 1448   APPEARANCEUR Hazy (A) 01/26/2023 1005   LABSPEC  >1.046 (H) 07/12/2024 1448   PHURINE 6.5 07/12/2024 1448   GLUCOSEU NEGATIVE 07/12/2024 1448   GLUCOSEU NEGATIVE 07/17/2022 1647   HGBUR TRACE (A) 07/12/2024 1448   BILIRUBINUR NEGATIVE 07/12/2024 1448   BILIRUBINUR Negative 01/26/2023 1005   KETONESUR 40 (A) 07/12/2024 1448   PROTEINUR 30 (A)  07/12/2024 1448   UROBILINOGEN 0.2 07/17/2022 1647   UROBILINOGEN 0.2 07/17/2022 1600   NITRITE NEGATIVE 07/12/2024 1448   LEUKOCYTESUR NEGATIVE 07/12/2024 1448   Sepsis Labs: @LABRCNTIP (procalcitonin:4,lacticidven:4) ) Recent Results (from the past 240 hours)  Resp panel by RT-PCR (RSV, Flu A&B, Covid) Anterior Nasal Swab     Status: None   Collection Time: 07/12/24  3:53 PM   Specimen: Anterior Nasal Swab  Result Value Ref Range Status   SARS Coronavirus 2 by RT PCR NEGATIVE NEGATIVE Final    Comment: (NOTE) SARS-CoV-2 target nucleic acids are NOT DETECTED.  The SARS-CoV-2 RNA is generally detectable in upper respiratory specimens during the acute phase of infection. The lowest concentration of SARS-CoV-2 viral copies this assay can detect is 138 copies/mL. A negative result does not preclude SARS-Cov-2 infection and should not be used as the sole basis for treatment or other patient management decisions. A negative result may occur with  improper specimen collection/handling, submission of specimen other than nasopharyngeal swab, presence of viral mutation(s) within the areas targeted by this assay, and inadequate number of viral copies(<138 copies/mL). A negative result must be combined with clinical observations, patient history, and epidemiological information. The expected result is Negative.  Fact Sheet for Patients:  BloggerCourse.com  Fact Sheet for Healthcare Providers:  SeriousBroker.it  This test is no t yet approved or cleared by the [place]  FDA and  has been authorized for detection and/or diagnosis of SARS-CoV-2  by FDA under an Emergency Use Authorization (EUA). This EUA will remain  in effect (meaning this test can be used) for the duration of the COVID-19 declaration under Section 564(b)(1) of the Act, 21 U.S.C.section 360bbb-3(b)(1), unless the authorization is terminated  or revoked sooner.       Influenza A by PCR NEGATIVE NEGATIVE Final   Influenza B by PCR NEGATIVE NEGATIVE Final    Comment: (NOTE) The Xpert Xpress SARS-CoV-2/FLU/RSV plus assay is intended as an aid in the diagnosis of influenza from Nasopharyngeal swab specimens and should not be used as a sole basis for treatment. Nasal washings and aspirates are unacceptable for Xpert Xpress SARS-CoV-2/FLU/RSV testing.  Fact Sheet for Patients: BloggerCourse.com  Fact Sheet for Healthcare Providers: SeriousBroker.it  This test is not yet approved or cleared by the [place]  FDA and has been authorized for detection and/or diagnosis of SARS-CoV-2 by FDA under an Emergency Use Authorization (EUA). This EUA will remain in effect (meaning this test can be used) for the duration of the COVID-19 declaration under Section 564(b)(1) of the Act, 21 U.S.C. section 360bbb-3(b)(1), unless the authorization is terminated or revoked.     Resp Syncytial Virus by PCR NEGATIVE NEGATIVE Final    Comment: (NOTE) Fact Sheet for Patients: BloggerCourse.com  Fact Sheet for Healthcare Providers: SeriousBroker.it  This test is not yet approved or cleared by the [place]  FDA and has been authorized for detection and/or diagnosis of SARS-CoV-2 by FDA under an Emergency Use Authorization (EUA). This EUA will remain in effect (meaning this test can be used) for the duration of the COVID-19 declaration under Section 564(b)(1) of the Act, 21 U.S.C. section 360bbb-3(b)(1), unless the authorization is terminated or revoked.  Performed at [facility], [address] [place], [place] [address]      Radiological Exams on Admission: DG Chest 2 View Result Date: 07/12/2024 CLINICAL DATA:  abd pain, elevated wbc, eval for PNA. EXAM: CHEST - 2 VIEW COMPARISON:  07/23/2017. FINDINGS: Bilateral lung fields are clear. Bilateral costophrenic  angles are clear. Stable cardio-mediastinal silhouette. No acute osseous abnormalities. The soft tissues are within normal limits. IMPRESSION: No active cardiopulmonary disease. Electronically Signed   By: Ree Molt M.D.   On: 07/12/2024 16:19   CT ABDOMEN PELVIS W CONTRAST Result Date: 07/12/2024 CLINICAL DATA:  Abdominal pain, acute, nonlocalized abd pain, constipation EXAM: CT ABDOMEN AND PELVIS WITH CONTRAST TECHNIQUE: Multidetector CT imaging of the abdomen and pelvis was performed using the standard protocol following bolus administration of intravenous contrast. RADIATION DOSE REDUCTION: This exam was performed according to the departmental dose-optimization program which includes automated exposure control, adjustment of the mA and/or kV according to patient size and/or use of iterative reconstruction technique. CONTRAST:  OMNIPAQUE  IOHEXOL  300 MG/ML  SOLN COMPARISON:  07/05/2024, 03/15/2024 FINDINGS: Lower chest: Trace bilateral pleural effusions with bibasilar atelectasis. Fibrolinear scarring in the right middle lobe. Hepatobiliary: No mass.Scattered subcentimeter hepatic hypodensities, too small to definitively characterize, but likely small cysts or biliary hematomas.No radiopaque stones or wall thickening of the gallbladder. No intrahepatic or extrahepatic biliary ductal dilation. The portal veins are patent. Pancreas: No mass or main ductal dilation. No peripancreatic inflammation or fluid collection. Spleen: Normal size. No mass. Adrenals/Urinary Tract: No adrenal masses. No renal mass. Subcentimeter hypodensities are noted in the kidneys, too small to definitively  characterize, but likely small cysts. Subtle urothelial enhancement noted in the left renal pelvis. No renal stones or hydronephrosis. The urinary bladder is distended without focal abnormality. Stomach/Bowel: Moderate-sized sliding-type hiatal hernia. 3.4 cm periampullary duodenum diverticulum. The stomach is decompressed without focal abnormality. No small bowel wall thickening or inflammation. No small bowel obstruction.The appendix was not visualized. No right lower quadrant or pericecal inflammatory changes to suggest acute appendicitis. Sigmoid colonic diverticulosis. No changes of acute diverticulitis. Dense contrast filling the colon. Minimal stool in the ascending colon. Vascular/Lymphatic: No aortic aneurysm. Diffuse aortoiliac atherosclerosis. No intraabdominal or pelvic lymphadenopathy. Reproductive: Hysterectomy. No concerning adnexal mass.No free pelvic fluid. Other: No pneumoperitoneum, ascites, or mesenteric inflammation. Musculoskeletal: No acute fracture or destructive lesion. Multilevel degenerative disc disease of the spine. Unchanged, moderate, grade 2 anterolisthesis of L5 on S1 with bilateral L5 pars interarticularis defects. IMPRESSION: 1. Subtle urothelial enhancement noted in the left renal pelvis, worrisome for an ascending urinary tract infection. Correlation with urinalysis recommended for further characterization. No hydronephrosis or nephrolithiasis. 2. Moderate-sized sliding-type hiatal hernia. Periampullary duodenum and colonic diverticulosis. No changes of acute diverticulitis. 3. The colon is full of hyperdense contrast material. Minimal stool noted in the ascending colon. 4. Trace bilateral pleural effusions. Aortic Atherosclerosis (ICD10-I70.0). Electronically Signed   By: Rogelia Myers M.D.   On: 07/12/2024 14:04    EKG: Independently reviewed. Atrial fibrillation.   Assessment/Plan   1. Acute on chronic abdominal and back pain - Location and character of the pain is  unchanged but severity has increased  - There is subtle urothelial enhancement in left renal pelvis on CT but no other acute findings or explanation for her recent worsening  - Continue pain-control, treat possible infection, follow clinical course   2. SIRS - WBC, HR, and RR elevated in ED  - There is subtle urothelial enhancement at left renal pelvis on CT but no urinary symptoms, bacteriuria, or pyuria  - Blood and urine cultures were collected in ED and antibiotics were started  - Continue Rocephin  for now, repeat CBC in am, follow cultures and clinical course   3. PAF  - Continue Xarelto  and metoprolol    4. Hypertension  - Continue  Norvasc , Toprol    5. Hypothyroidism  - Synthroid     6. Hx of TIA  - Lipitor, Xarelto    7. Hypokalemia  - Replaced in ED, will repeat chem panel in am      DVT prophylaxis: Xarelto   Code Status: Full  Level of Care: Level of care: Telemetry Family Communication: Son Lenoard) by phone  Disposition Plan:  Patient is from: Home  Anticipated d/c is to: TBD Anticipated d/c date is: 9/10 or 07/14/24  Patient currently: Pending pain-control, disposition planning  Consults called: None  Admission status: Observation     Evalene GORMAN Sprinkles, MD [facility]  07/12/2024, 10:09 PM

## 2024-07-13 DIAGNOSIS — R1013 Epigastric pain: Secondary | ICD-10-CM

## 2024-07-13 DIAGNOSIS — E876 Hypokalemia: Secondary | ICD-10-CM | POA: Diagnosis not present

## 2024-07-13 DIAGNOSIS — D72829 Elevated white blood cell count, unspecified: Secondary | ICD-10-CM

## 2024-07-13 LAB — URINE CULTURE: Culture: NO GROWTH

## 2024-07-13 LAB — BASIC METABOLIC PANEL WITH GFR
Anion gap: 15 (ref 5–15)
BUN: 13 mg/dL (ref 8–23)
CO2: 21 mmol/L — ABNORMAL LOW (ref 22–32)
Calcium: 8.8 mg/dL — ABNORMAL LOW (ref 8.9–10.3)
Chloride: 104 mmol/L (ref 98–111)
Creatinine, Ser: 0.58 mg/dL (ref 0.44–1.00)
GFR, Estimated: >60 mL/min (ref >60)
Glucose, Bld: 99 mg/dL (ref 70–99)
Potassium: 4.3 mmol/L (ref 3.5–5.1)
Sodium: 140 mmol/L (ref 135–145)

## 2024-07-13 LAB — CBC
HCT: 34.4 % — ABNORMAL LOW (ref 36.0–46.0)
Hemoglobin: 11.2 g/dL — ABNORMAL LOW (ref 12.0–15.0)
MCH: 29.9 pg (ref 26.0–34.0)
MCHC: 32.6 g/dL (ref 30.0–36.0)
MCV: 91.7 fL (ref 80.0–100.0)
Platelets: 511 K/uL — ABNORMAL HIGH (ref 150–400)
RBC: 3.75 MIL/uL — ABNORMAL LOW (ref 3.87–5.11)
RDW: 13 % (ref 11.5–15.5)
WBC: 19 K/uL — ABNORMAL HIGH (ref 4.0–10.5)
nRBC: 0 % (ref 0.0–0.2)

## 2024-07-13 MED ORDER — POLYETHYLENE GLYCOL 3350 17 G PO PACK
17.0000 g | PACK | Freq: Every day | ORAL | Status: DC
  Administered 2024-07-14 – 2024-07-16 (×3): 17 g via ORAL
  Filled 2024-07-13 (×3): qty 1

## 2024-07-13 MED ORDER — LORATADINE 10 MG PO TABS
10.0000 mg | ORAL_TABLET | Freq: Every day | ORAL | Status: DC
  Administered 2024-07-13 – 2024-07-18 (×6): 10 mg via ORAL
  Filled 2024-07-13 (×6): qty 1

## 2024-07-13 MED ORDER — SENNOSIDES-DOCUSATE SODIUM 8.6-50 MG PO TABS
2.0000 | ORAL_TABLET | Freq: Two times a day (BID) | ORAL | Status: DC
  Administered 2024-07-13 – 2024-07-16 (×7): 2 via ORAL
  Filled 2024-07-13 (×7): qty 2

## 2024-07-13 NOTE — TOC Initial Note (Addendum)
 Transition of Care [facility]) - Initial/Assessment Note    Patient Details  Name: Patricia Peck MRN: [id number] Date of Birth: 1946/01/05  Transition of Care [facility]) CM/SW Contact:    [name], RN Phone Number: 07/13/2024, 3:48 PM  Clinical Narrative:                 Pt is from home. NCM met with pt at bedside to discuss PT recommendation for HHPT, and pt is agreeable to recommendation. Pt denies any HH prior to this admission. Denies and DME or SDOH needs. NCM sent referrals for HHPT to Amy with [facility] and Cindie with [facility], awaiting a response. IP Care Management is continuing to follow.  Addendum: Cindie with [facility] has accepted pt for HHPT. Will need HH orders at DC.    Expected Discharge Plan: Home w Home Health Services Barriers to Discharge: Continued Medical Work up   Patient Goals and CMS Choice Patient states their goals for this hospitalization and ongoing recovery are:: To return home CMS Medicare.gov Compare Post Acute Care list provided to:: Patient Choice offered to / list presented to : Patient [place] ownership interest in [facility].provided to:: Patient    Expected Discharge Plan and Services In-house Referral: NA Discharge Planning Services: CM Consult Post Acute Care Choice: Home Health Living arrangements for the past 2 months: Single Family Home                 DME Arranged: N/A DME Agency: NA       HH Arranged: PT HH Agency: [facility] Date [facility] Agency Contacted: 07/13/24 Time HH Agency Contacted: [phone] Representative spoke with at [facility] Agency: Cindie Sillmon  Prior Living Arrangements/Services Living arrangements for the past 2 months: Single Family Home Lives with:: Relatives Patient language and need for interpreter reviewed:: Yes Do you feel safe going back to the place where you live?: Yes      Need for Family Participation in Patient Care: No (Comment) Care giver support system in place?: Yes  (comment) Current home services: Other (comment) (NA) Criminal Activity/Legal Involvement Pertinent to Current Situation/Hospitalization: No - Comment as needed  Activities of Daily Living   ADL Screening (condition at time of admission) Independently performs ADLs?: No Does the patient have a NEW difficulty with bathing/dressing/toileting/self-feeding that is expected to last >3 days?: Yes (Initiates electronic notice to provider for possible OT consult) Does the patient have a NEW difficulty with getting in/out of bed, walking, or climbing stairs that is expected to last >3 days?: Yes (Initiates electronic notice to provider for possible PT consult) Does the patient have a NEW difficulty with communication that is expected to last >3 days?: No Is the patient deaf or have difficulty hearing?: Yes Does the patient have difficulty seeing, even when wearing glasses/contacts?: No Does the patient have difficulty concentrating, remembering, or making decisions?: No  Permission Sought/Granted Permission sought to share information with : Family Supports Permission granted to share information with : Yes, Verbal Permission Granted  Share Information with NAME: Annahi, Short (Son)  [phone]           Emotional Assessment Appearance:: Appears stated age Attitude/Demeanor/Rapport: Gracious, Engaged Affect (typically observed): Accepting, Appropriate Orientation: : Oriented to Self, Oriented to Place, Oriented to  Time, Oriented to Situation Alcohol / Substance Use: Not Applicable Psych Involvement: No (comment)  Admission diagnosis:  Hypokalemia [E87.6] Epigastric pain [R10.13] UTI (urinary tract infection) [N39.0] Systemic inflammatory response syndrome (SIRS) (HCC) [R65.10]  Leukocytosis, unspecified type [D72.829] Patient Active Problem List   Diagnosis Date Noted   Intractable pain 07/12/2024   SIRS (systemic inflammatory response syndrome) (HCC) 07/12/2024   Hypokalemia  07/12/2024   Mild persistent asthma without complication 05/05/2024   Acquired spondylolysis of lumbar spine 04/12/2024   Anterolisthesis of lumbosacral spine 04/12/2024   Weight loss, unintentional 03/24/2024   Liver lesion 03/24/2024   Slow transit constipation 03/24/2024   Hiatal hernia 03/24/2024   Vasomotor rhinitis 02/24/2022   Dysuria 02/24/2022   Mild intermittent asthma without complication 07/16/2021   Cystitis 02/14/2021   Right ovarian cyst 02/07/2021   Left arm pain 11/22/2019   Hyperlipidemia 09/17/2019   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/2019   History of TIA (transient ischemic attack) 08/01/2019   Anticoagulated 08/01/2019   Situational anxiety 05/23/2019   Unspecified eustachian tube disorder, unspecified ear 04/12/2014   Other chronic allergic conjunctivitis 08/24/2013   IBS (irritable bowel syndrome) 04/11/2013   PAF (paroxysmal atrial fibrillation) (HCC) 01/27/2013   Cerebrovascular disease 01/27/2013   Insomnia 11/02/2012   Esophageal reflux 03/12/2010   Hypothyroidism 04/14/2007   Depression 04/14/2007   Essential hypertension 04/14/2007   PND (post-nasal drip) 04/14/2007   Osteoporosis 04/14/2007   PCP:  Katheen Roselie Rockford, NP Pharmacy:   [id number] [place], [place] - [address] AT [facility] [address] [place] [address] Phone: [phone] Fax: [phone]  [place] [facility] [address] [place] [address] Phone: [phone] Fax: [phone]     Social Drivers of Health (SDOH) Social History: SDOH Screenings   Food Insecurity: No Food Insecurity (07/12/2024)  Housing: Low Risk  (07/12/2024)  Transportation Needs: No Transportation Needs (07/12/2024)  Utilities: At Risk (07/12/2024)  Alcohol Screen: Low Risk  (11/26/2023)  Depression (PHQ2-9): Low Risk  (04/12/2024)  Financial Resource Strain: Low Risk  (11/26/2023)  Physical Activity:  Insufficiently Active (11/26/2023)  Social Connections: Moderately Integrated (07/12/2024)  Stress: Stress Concern Present (11/26/2023)  Tobacco Use: Low Risk  (07/12/2024)  Health Literacy: Adequate Health Literacy (10/19/2023)   SDOH Interventions:     Readmission Risk Interventions     No data to display

## 2024-07-13 NOTE — Plan of Care (Signed)

## 2024-07-13 NOTE — Progress Notes (Signed)
 [facility] PROGRESS NOTE   Patricia Peck FMW:[id number] DOB: 1945/11/19 DOA: 07/12/2024  PCP: Katheen Roselie Rockford, NP  Brief History: 78 y.o. female with medical history significant for hypertension, hyperlipidemia, history of TIA, PAF on Xarelto , GERD, chronic abdominal pain, and chronic back pain who presents with worsening in her abdominal and back pain. Patient reports that she has been experiencing generalized abdominal pain and back pain for many years.  Pain has been worsening recently, was better when she was taking a muscle relaxant, but she ran out of the muscle relaxer and pain has been uncontrolled.  Pain is in the same location and of the same character as her chronic pain, severity has increased.  She follows with [facility] and is being considered for esophageal dilation.  She denies any dysphagia at this time. CT of the abdomen and pelvis is notable for subtle urothelial enhancement in the left renal pelvis, hiatal hernia, and minimal stool.    Consultants: None  Procedures: None    Subjective/Interval History: Patient complains of right-sided flank pain as well.  Denies any nausea this morning.  Cannot get comfortable in the bed.    Assessment/Plan:  Acute on chronic abdominal and back pain Etiology unclear.  WBC was noted to be elevated.  She had normal lactic acid levels.  UA did not suggest infection however CT scan raised concern for urothelial enhancement in the left renal pelvis.  No other abnormalities noted. She was empirically started on antibiotics for presumed UTI. She also mentions that she has been constipated for the last 7 days although has had poor oral intake in the last 7 days.  Home medication list has been reviewed.  She is noted to be on PPI twice a day.  Not noted to be on any other pain medications at this time. Continue PPI. Will see if there is any improvement with treatment of presumed UTI.  Will also initiate laxatives and stool  softeners. Recently seen by gastroenterology in August.  This was for heartburn.  No recent upper endoscopy noted.  Upper GI series was ordered at that time of her outpatient visit.  This was done on September 2 which showed small type I hiatal hernia with mild gastroesophageal reflux.  Cervical esophageal web noted.  Probable widely patent distal esophageal mucosal ring was noted the barium tablet passed through this region without delay. If there is no improvement in patient's symptoms in the next 24 hours will consult gastroenterology. Monitor WBC trends.  SIRS Elevated WBC heart rate and respiratory rate was noted in the ED. She is being treated for presumed UTI as discussed above.  Paroxysmal atrial fibrillation Noted to be on metoprolol  and Xarelto .  Will hold her Xarelto  in case upper endoscopy is needed during this admission.  Essential hypertension Noted to be on amlodipine  and metoprolol .  Hypothyroidism Continue with thyroxine.  Hypokalemia Supplemented.  Magnesium  was 2.0.  DVT Prophylaxis: Xarelto  currently on hold.  SCDs Code Status: Full code Family Communication: Discussed with patient Disposition Plan: Home when improved.  PT and OT eval.  Status is: Observation The patient will require care spanning > 2 midnights and should be moved to inpatient because: Need for IV antibiotics      Medications: Scheduled:  amLODipine   5 mg Oral QPM   atorvastatin   80 mg Oral Daily   levothyroxine   50 mcg Oral QAC breakfast   metoprolol  succinate  50 mg Oral Daily   pantoprazole   40 mg Oral BID AC  sodium chloride  flush  3 mL Intravenous Q12H   Continuous:  cefTRIAXone  (ROCEPHIN )  IV     PRN:acetaminophen  **OR** acetaminophen , HYDROmorphone  (DILAUDID ) injection, methocarbamol , ondansetron  **OR** ondansetron  (ZOFRAN ) IV, oxyCODONE , polyethylene glycol, temazepam   Antibiotics: Anti-infectives (From admission, onward)    Start     Dose/Rate Route Frequency Ordered Stop    07/13/24 1600  cefTRIAXone  (ROCEPHIN ) 1 g in sodium chloride  0.9 % 100 mL IVPB        1 g 200 mL/hr over 30 Minutes Intravenous Every 24 hours 07/12/24 2055     07/12/24 1615  cefTRIAXone  (ROCEPHIN ) 1 g in sodium chloride  0.9 % 100 mL IVPB        1 g 200 mL/hr over 30 Minutes Intravenous  Once 07/12/24 1602 07/12/24 1713   07/12/24 1615  metroNIDAZOLE  (FLAGYL ) IVPB 500 mg        500 mg 100 mL/hr over 60 Minutes Intravenous  Once 07/12/24 1602 07/12/24 1844       Objective:  Vital Signs  Vitals:   07/12/24 2319 07/13/24 0414 07/13/24 0749 07/13/24 0753  BP: (!) 127/90 (!) 144/106 (!) 156/124   Pulse: (!) 106 (!) 102 (!) 104 86  Resp: 17 17 18    Temp: 98.2 F (36.8 C) 97.8 F (36.6 C) 98.3 F (36.8 C)   TempSrc: Oral Oral    SpO2: 93% 91%  97%  Height:        Intake/Output Summary (Last 24 hours) at 07/13/2024 9060 Last data filed at 07/12/2024 1844 Gross per 24 hour  Intake 1200.41 ml  Output 300 ml  Net 900.41 ml   There were no vitals filed for this visit.  General appearance: Awake alert.  In no distress.  Distracted Resp: Clear to auscultation bilaterally.  Normal effort Cardio: S1-S2 is normal regular.  No S3-S4.  No rubs murmurs or bruit GI: Abdomen is soft.  Mildly tender diffusely without any rebound rigidity or guarding.  No masses organomegaly appreciated. Extremities: No edema.  Full range of motion of lower extremities. Neurologic: Alert and oriented x3.  No focal neurological deficits.    Lab Results:  Data Reviewed: I have personally reviewed following labs and reports of the imaging studies  CBC: Recent Labs  Lab 07/12/24 1125 07/13/24 0522  WBC 20.7* 19.0*  NEUTROABS 17.6*  --   HGB 11.8* 11.2*  HCT 34.6* 34.4*  MCV 87.2 91.7  PLT 469* 511*    Basic Metabolic Panel: Recent Labs  Lab 07/12/24 1125 07/13/24 0522  NA 136 140  K 3.1* 4.3  CL 97* 104  CO2 21* 21*  GLUCOSE 114* 99  BUN 17 13  CREATININE 0.70 0.58  CALCIUM  9.5 8.8*   MG 2.0  --     GFR: CrCl cannot be calculated (Unknown ideal weight.).  Liver Function Tests: Recent Labs  Lab 07/12/24 1125  AST 26  ALT 18  ALKPHOS 113  BILITOT 1.0  PROT 7.8  ALBUMIN 3.8    Recent Labs  Lab 07/12/24 1125  LIPASE 24    Recent Results (from the past 240 hours)  Resp panel by RT-PCR (RSV, Flu A&B, Covid) Anterior Nasal Swab     Status: None   Collection Time: 07/12/24  3:53 PM   Specimen: Anterior Nasal Swab  Result Value Ref Range Status   SARS Coronavirus 2 by RT PCR NEGATIVE NEGATIVE Final    Comment: (NOTE) SARS-CoV-2 target nucleic acids are NOT DETECTED.  The SARS-CoV-2 RNA is generally detectable in upper respiratory  specimens during the acute phase of infection. The lowest concentration of SARS-CoV-2 viral copies this assay can detect is 138 copies/mL. A negative result does not preclude SARS-Cov-2 infection and should not be used as the sole basis for treatment or other patient management decisions. A negative result may occur with  improper specimen collection/handling, submission of specimen other than nasopharyngeal swab, presence of viral mutation(s) within the areas targeted by this assay, and inadequate number of viral copies(<138 copies/mL). A negative result must be combined with clinical observations, patient history, and epidemiological information. The expected result is Negative.  Fact Sheet for Patients:  BloggerCourse.com  Fact Sheet for Healthcare Providers:  SeriousBroker.it  This test is no t yet approved or cleared by the [place]  FDA and  has been authorized for detection and/or diagnosis of SARS-CoV-2 by FDA under an Emergency Use Authorization (EUA). This EUA will remain  in effect (meaning this test can be used) for the duration of the COVID-19 declaration under Section 564(b)(1) of the Act, 21 U.S.C.section 360bbb-3(b)(1), unless the authorization is  terminated  or revoked sooner.       Influenza A by PCR NEGATIVE NEGATIVE Final   Influenza B by PCR NEGATIVE NEGATIVE Final    Comment: (NOTE) The Xpert Xpress SARS-CoV-2/FLU/RSV plus assay is intended as an aid in the diagnosis of influenza from Nasopharyngeal swab specimens and should not be used as a sole basis for treatment. Nasal washings and aspirates are unacceptable for Xpert Xpress SARS-CoV-2/FLU/RSV testing.  Fact Sheet for Patients: BloggerCourse.com  Fact Sheet for Healthcare Providers: SeriousBroker.it  This test is not yet approved or cleared by the [place]  FDA and has been authorized for detection and/or diagnosis of SARS-CoV-2 by FDA under an Emergency Use Authorization (EUA). This EUA will remain in effect (meaning this test can be used) for the duration of the COVID-19 declaration under Section 564(b)(1) of the Act, 21 U.S.C. section 360bbb-3(b)(1), unless the authorization is terminated or revoked.     Resp Syncytial Virus by PCR NEGATIVE NEGATIVE Final    Comment: (NOTE) Fact Sheet for Patients: BloggerCourse.com  Fact Sheet for Healthcare Providers: SeriousBroker.it  This test is not yet approved or cleared by the [place]  FDA and has been authorized for detection and/or diagnosis of SARS-CoV-2 by FDA under an Emergency Use Authorization (EUA). This EUA will remain in effect (meaning this test can be used) for the duration of the COVID-19 declaration under Section 564(b)(1) of the Act, 21 U.S.C. section 360bbb-3(b)(1), unless the authorization is terminated or revoked.  Performed at [facility], [address], [place], [place] [address]   Blood culture (routine x 2)     Status: None (Preliminary result)   Collection Time: 07/12/24  4:30 PM   Specimen: BLOOD  Result Value Ref Range Status   Specimen Description    Final    BLOOD LEFT ANTECUBITAL Performed at [facility], [address], [place], [place] [address]    Special Requests   Final    BOTTLES DRAWN AEROBIC AND ANAEROBIC Blood Culture results may not be optimal due to an inadequate volume of blood received in culture bottles Performed at [facility], [address] [place], [place] [address]    Culture   Final    NO GROWTH < 12 HOURS Performed at [facility], [address] [place], [place] [address]    Report Status PENDING  Incomplete  Blood culture (routine x 2)     Status: None (  Preliminary result)   Collection Time: 07/12/24  4:34 PM   Specimen: BLOOD  Result Value Ref Range Status   Specimen Description   Final    BLOOD LEFT ANTECUBITAL Performed at [facility], [address], [place], [place] [address]    Special Requests   Final    BOTTLES DRAWN AEROBIC AND ANAEROBIC Blood Culture results may not be optimal due to an inadequate volume of blood received in culture bottles Performed at [facility], [address], [place], [place] [address]    Culture   Final    NO GROWTH < 12 HOURS Performed at [facility], [address], [place], [place] [address]    Report Status PENDING  Incomplete      Radiology Studies: DG Chest 2 View Result Date: 07/12/2024 CLINICAL DATA:  abd pain, elevated wbc, eval for PNA. EXAM: CHEST - 2 VIEW COMPARISON:  07/23/2017. FINDINGS: Bilateral lung fields are clear. Bilateral costophrenic angles are clear. Stable cardio-mediastinal silhouette. No acute osseous abnormalities. The soft tissues are within normal limits. IMPRESSION: No active cardiopulmonary disease. Electronically Signed   By: Ree Molt M.D.   On: 07/12/2024 16:19   CT ABDOMEN PELVIS W CONTRAST Result Date: 07/12/2024 CLINICAL DATA:  Abdominal pain, acute, nonlocalized abd pain, constipation EXAM: CT ABDOMEN AND PELVIS WITH CONTRAST  TECHNIQUE: Multidetector CT imaging of the abdomen and pelvis was performed using the standard protocol following bolus administration of intravenous contrast. RADIATION DOSE REDUCTION: This exam was performed according to the departmental dose-optimization program which includes automated exposure control, adjustment of the mA and/or kV according to patient size and/or use of iterative reconstruction technique. CONTRAST:  OMNIPAQUE  IOHEXOL  300 MG/ML  SOLN COMPARISON:  07/05/2024, 03/15/2024 FINDINGS: Lower chest: Trace bilateral pleural effusions with bibasilar atelectasis. Fibrolinear scarring in the right middle lobe. Hepatobiliary: No mass.Scattered subcentimeter hepatic hypodensities, too small to definitively characterize, but likely small cysts or biliary hematomas.No radiopaque stones or wall thickening of the gallbladder. No intrahepatic or extrahepatic biliary ductal dilation. The portal veins are patent. Pancreas: No mass or main ductal dilation. No peripancreatic inflammation or fluid collection. Spleen: Normal size. No mass. Adrenals/Urinary Tract: No adrenal masses. No renal mass. Subcentimeter hypodensities are noted in the kidneys, too small to definitively characterize, but likely small cysts. Subtle urothelial enhancement noted in the left renal pelvis. No renal stones or hydronephrosis. The urinary bladder is distended without focal abnormality. Stomach/Bowel: Moderate-sized sliding-type hiatal hernia. 3.4 cm periampullary duodenum diverticulum. The stomach is decompressed without focal abnormality. No small bowel wall thickening or inflammation. No small bowel obstruction.The appendix was not visualized. No right lower quadrant or pericecal inflammatory changes to suggest acute appendicitis. Sigmoid colonic diverticulosis. No changes of acute diverticulitis. Dense contrast filling the colon. Minimal stool in the ascending colon. Vascular/Lymphatic: No aortic aneurysm. Diffuse aortoiliac  atherosclerosis. No intraabdominal or pelvic lymphadenopathy. Reproductive: Hysterectomy. No concerning adnexal mass.No free pelvic fluid. Other: No pneumoperitoneum, ascites, or mesenteric inflammation. Musculoskeletal: No acute fracture or destructive lesion. Multilevel degenerative disc disease of the spine. Unchanged, moderate, grade 2 anterolisthesis of L5 on S1 with bilateral L5 pars interarticularis defects. IMPRESSION: 1. Subtle urothelial enhancement noted in the left renal pelvis, worrisome for an ascending urinary tract infection. Correlation with urinalysis recommended for further characterization. No hydronephrosis or nephrolithiasis. 2. Moderate-sized sliding-type hiatal hernia. Periampullary duodenum and colonic diverticulosis. No changes of acute diverticulitis. 3. The colon is full of hyperdense contrast material. Minimal stool noted in the ascending colon. 4.  Trace bilateral pleural effusions. Aortic Atherosclerosis (ICD10-I70.0). Electronically Signed   By: Rogelia Myers M.D.   On: 07/12/2024 14:04       LOS: 0 days   Montez Cuda Verdene  [facility] Pager on [email or web address]  07/13/2024, 9:39 AM

## 2024-07-13 NOTE — Evaluation (Signed)
 Physical Therapy Evaluation Patient Details Name: Patricia Peck MRN: [id number] DOB: Jun 03, 1946 Today's Date: 07/13/2024  History of Present Illness  78 yo female presents to therapy following hospital admission secondary to abdominal and back pain. Pt reports chronic pain and recently running out of mm relaxer and endorses constipation. Pt was found to have abn labs, A-fib and HTN. Pt imaging  and suspect UTI.  Pt dx with SIRS, leukocytosis and epigastric and acute on chronic back pain. Pt PMH includes but is not limited to: PAF, HTN, TIA, hypothyroidism, arthritis, depression, GERD, HLD, and OP.  Clinical Impression      Pt admitted with above diagnosis.  Pt currently with functional limitations due to the deficits listed below (see PT Problem List). Pt in bed when PT arrived. Son present. Pt agreeable to therapy intervention. [name] R flank and back pain and later L LE pain and abn sensation. Pt required CGA for supine to sit, CGA with HHA for transfer tasks with cues for safety and HHA with pt reaching for items in room for furniture surfing with gait assessment bed to commode and commode to recliner 18 and 15 feet. Pt left seated in recliner and all needs in place with son present.  Pt will benefit from acute skilled PT to increase their independence and safety with mobility to allow discharge.     If plan is discharge home, recommend the following: A little help with walking and/or transfers;A little help with bathing/dressing/bathroom;Assistance with cooking/housework;Direct supervision/assist for medications management;Assist for transportation;Help with stairs or ramp for entrance;Supervision due to cognitive status   Can travel by private vehicle        Equipment Recommendations None recommended by PT  Recommendations for Other Services       Functional Status Assessment Patient has had a recent decline in their functional status and demonstrates the ability to make significant  improvements in function in a reasonable and predictable amount of time.     Precautions / Restrictions Precautions Precautions: Fall Restrictions Weight Bearing Restrictions Per Provider Order: No      Mobility  Bed Mobility Overal bed mobility: Needs Assistance Bed Mobility: Supine to Sit     Supine to sit: Contact guard     General bed mobility comments: HOB elevated, cues    Transfers Overall transfer level: Needs assistance Equipment used: 1 person hand held assist Transfers: Sit to/from Stand Sit to Stand: Contact guard assist, Min assist           General transfer comment: cues for safety with bed, chair and recliner transfers    Ambulation/Gait Ambulation/Gait assistance: Min assist Gait Distance (Feet): 18 Feet Assistive device: 1 person hand held assist Gait Pattern/deviations: Step-to pattern, Decreased stance time - left, Antalgic Gait velocity: decreased     General Gait Details: erractic gait pattern with pt reaching for items in room for stabiltiy with HHA from PT, pt electing to place L foot anteriorly and perform gallop- like gait pattern limting WB through L LE due to pt reports of her leg being asleep  Stairs            Wheelchair Mobility     Tilt Bed    Modified Rankin (Stroke Patients Only)       Balance Overall balance assessment: Mild deficits observed, not formally tested (pt and son deny falls)  Pertinent Vitals/Pain Pain Assessment Pain Assessment: Faces Faces Pain Scale: Hurts even more Pain Location: R flank and upper back pain Pain Descriptors / Indicators: Constant, Grimacing, Guarding Pain Intervention(s): Limited activity within patient's tolerance, Monitored during session, Premedicated before session, Repositioned    Home Living Family/patient expects to be discharged to:: Private residence Living Arrangements: Children Available Help at  Discharge: Family Type of Home: House Home Access: Stairs to enter Entrance Stairs-Rails: Right Entrance Stairs-Number of Steps: 3   Home Layout: Two level;Able to live on main level with bedroom/bathroom Home Equipment: Rolling Walker (2 wheels);Rollator (4 wheels);Cane - single point Additional Comments: son lives on second floor, pt does not access.    Prior Function Prior Level of Function : Needs assist  Cognitive Assist : ADLs (cognitive)           Mobility Comments: IND no AD for all ADLs, self care tasks ADLs Comments: son assists with IADLs     Extremity/Trunk Assessment        Lower Extremity Assessment Lower Extremity Assessment: Generalized weakness (pt reports new onset of distal  L LE abn sensation)    Cervical / Trunk Assessment Cervical / Trunk Assessment: Normal  Communication   Communication Communication: Impaired Factors Affecting Communication:  (word finding)    Cognition Arousal: Alert Behavior During Therapy: Restless, Impulsive   PT - Cognitive impairments: History of cognitive impairments, Memory, Attention, Safety/Judgement                         Following commands: Impaired Following commands impaired: Follows one step commands with increased time (repettative cues)     Cueing Cueing Techniques: Verbal cues, Visual cues, Gestural cues, Tactile cues     General Comments      Exercises     Assessment/Plan    PT Assessment Patient needs continued PT services  PT Problem List Decreased strength;Decreased activity tolerance;Decreased balance;Decreased mobility;Decreased coordination;Decreased cognition;Decreased safety awareness;Pain       PT Treatment Interventions DME instruction;Gait training;Stair training;Functional mobility training;Therapeutic activities;Therapeutic exercise;Balance training;Neuromuscular re-education;Cognitive remediation;Patient/family education    PT Goals (Current goals can be found in the  Care Plan section)  Acute Rehab PT Goals Patient Stated Goal: to be able to go home to [name] (dog) PT Goal Formulation: With patient Time For Goal Achievement: 07/27/24 Potential to Achieve Goals: Good    Frequency Min 3X/week     Co-evaluation               AM-PAC PT 6 Clicks Mobility  Outcome Measure Help needed turning from your back to your side while in a flat bed without using bedrails?: None Help needed moving from lying on your back to sitting on the side of a flat bed without using bedrails?: A Little Help needed moving to and from a bed to a chair (including a wheelchair)?: A Little Help needed standing up from a chair using your arms (e.g., wheelchair or bedside chair)?: A Little Help needed to walk in hospital room?: A Little Help needed climbing 3-5 steps with a railing? : A Lot 6 Click Score: 18    End of Session Equipment Utilized During Treatment: Gait belt Activity Tolerance: Patient limited by pain;Patient limited by fatigue Patient left: in chair;with call bell/phone within reach;with chair alarm set;with family/visitor present Nurse Communication: Mobility status PT Visit Diagnosis: Unsteadiness on feet (R26.81);Other abnormalities of gait and mobility (R26.89);Muscle weakness (generalized) (M62.81);Difficulty in walking, not elsewhere classified (R26.2);Pain Pain - Right/Left: Left  Pain - part of body: Leg;Ankle and joints of foot    Time: 8943-8880 PT Time Calculation (min) (ACUTE ONLY): 23 min   Charges:   PT Evaluation $PT Eval Low Complexity: 1 Low PT Treatments $Gait Training: 8-22 mins PT General Charges $$ ACUTE PT VISIT: 1 Visit         [place], PT Acute Rehab   [place] VEAR Drone 07/13/2024, 11:53 AM

## 2024-07-13 NOTE — Care Management Obs Status (Signed)
 MEDICARE OBSERVATION STATUS NOTIFICATION   Patient Details  Name: Patricia Peck MRN: [id number] Date of Birth: 03/28/1946   Medicare Observation Status Notification Given:  Yes    [name], RN 07/13/2024, 2:36 PM

## 2024-07-14 ENCOUNTER — Observation Stay (HOSPITAL_COMMUNITY)

## 2024-07-14 ENCOUNTER — Ambulatory Visit

## 2024-07-14 DIAGNOSIS — D72829 Elevated white blood cell count, unspecified: Secondary | ICD-10-CM | POA: Diagnosis not present

## 2024-07-14 DIAGNOSIS — M2012 Hallux valgus (acquired), left foot: Secondary | ICD-10-CM | POA: Diagnosis not present

## 2024-07-14 DIAGNOSIS — R1013 Epigastric pain: Secondary | ICD-10-CM | POA: Diagnosis not present

## 2024-07-14 DIAGNOSIS — M7989 Other specified soft tissue disorders: Secondary | ICD-10-CM | POA: Diagnosis not present

## 2024-07-14 DIAGNOSIS — E876 Hypokalemia: Secondary | ICD-10-CM | POA: Diagnosis not present

## 2024-07-14 DIAGNOSIS — M21629 Bunionette of unspecified foot: Secondary | ICD-10-CM | POA: Diagnosis not present

## 2024-07-14 DIAGNOSIS — M779 Enthesopathy, unspecified: Secondary | ICD-10-CM | POA: Diagnosis not present

## 2024-07-14 LAB — BASIC METABOLIC PANEL WITH GFR
Anion gap: 15 (ref 5–15)
BUN: 10 mg/dL (ref 8–23)
CO2: 21 mmol/L — ABNORMAL LOW (ref 22–32)
Calcium: 8.7 mg/dL — ABNORMAL LOW (ref 8.9–10.3)
Chloride: 98 mmol/L (ref 98–111)
Creatinine, Ser: 0.64 mg/dL (ref 0.44–1.00)
GFR, Estimated: >60 mL/min (ref >60)
Glucose, Bld: 93 mg/dL (ref 70–99)
Potassium: 3.6 mmol/L (ref 3.5–5.1)
Sodium: 134 mmol/L — ABNORMAL LOW (ref 135–145)

## 2024-07-14 LAB — CBC
HCT: 35.3 % — ABNORMAL LOW (ref 36.0–46.0)
Hemoglobin: 11.1 g/dL — ABNORMAL LOW (ref 12.0–15.0)
MCH: 28.7 pg (ref 26.0–34.0)
MCHC: 31.4 g/dL (ref 30.0–36.0)
MCV: 91.2 fL (ref 80.0–100.0)
Platelets: 550 K/uL — ABNORMAL HIGH (ref 150–400)
RBC: 3.87 MIL/uL (ref 3.87–5.11)
RDW: 13.1 % (ref 11.5–15.5)
WBC: 18.1 K/uL — ABNORMAL HIGH (ref 4.0–10.5)
nRBC: 0 % (ref 0.0–0.2)

## 2024-07-14 MED ORDER — SODIUM CHLORIDE 0.9 % IV SOLN: INTRAVENOUS | Status: AC

## 2024-07-14 MED ORDER — POTASSIUM CHLORIDE CRYS ER 20 MEQ PO TBCR
40.0000 meq | EXTENDED_RELEASE_TABLET | Freq: Once | ORAL | Status: AC
  Administered 2024-07-14: 40 meq via ORAL
  Filled 2024-07-14: qty 2

## 2024-07-14 NOTE — Progress Notes (Signed)
 Physical Therapy Treatment Patient Details Name: Patricia Peck MRN: [id number] DOB: 04-10-1946 Today's Date: 07/14/2024   History of Present Illness 78 yo female presents to therapy following hospital admission secondary to abdominal and back pain. Pt reports chronic pain and recently running out of mm relaxer and endorses constipation. Pt was found to have abn labs, A-fib and HTN. Pt imaging  and suspect UTI.  Pt dx with SIRS, leukocytosis and epigastric and acute on chronic back pain. Pt PMH includes but is not limited to: PAF, HTN, TIA, hypothyroidism, arthritis, depression, GERD, HLD, and OP.    PT Comments  Pt in bed, agreeable to PT session. She reports that she feels she needs to use the restroom, unable to complete transfer in time to [facility] due to decreased LLE WB tolerance. Pt not premedicated before session which may be contributing to the decreased level of function at this time. Pt able to complete 2nd attempt to the [facility] and finishes voiding bladder. While sitting, pt able to complete kicks x10 BLE, but reports inc low back pain with LLE knee extension. There is a bias to increased pain with dorsiflexion suspecting sciatic nerve involvement as well as LLE localized pain drivers. She is able to complete step pivot with limited WB to the recliner. Nursing preparing to administer pain meds. Per chart review pt and son elect HHPT, WC recommendations provided if this level of function persists in order to maintain safe mobility for pt and her son as a caregiver.     If plan is discharge home, recommend the following: A little help with walking and/or transfers;A little help with bathing/dressing/bathroom;Assistance with cooking/housework;Direct supervision/assist for medications management;Assist for transportation;Help with stairs or ramp for entrance;Supervision due to cognitive status   Can travel by private vehicle        Equipment Recommendations  Wheelchair (measurements PT) (20 inch  standard WC with elevating and swing away leg rests and foam cushion)    Recommendations for Other Services       Precautions / Restrictions Precautions Precautions: Fall Recall of Precautions/Restrictions: Intact Restrictions Weight Bearing Restrictions Per Provider Order: No     Mobility  Bed Mobility Overal bed mobility: Needs Assistance Bed Mobility: Supine to Sit     Supine to sit: HOB elevated, Used rails, Contact guard          Transfers Overall transfer level: Needs assistance Equipment used: 1 person hand held assist Transfers: Sit to/from Stand, Bed to chair/wheelchair/BSC Sit to Stand: Min assist   Step pivot transfers: Mod assist       General transfer comment: Pt unable to progress amb today due to inc L foot pain. Pt completes sit to stand with min A and RLE dominant stance, pivots to [facility]. BSC to recliner completed with limited WB on LLE. Pt not premedicated today.    Ambulation/Gait                   Stairs             Wheelchair Mobility     Tilt Bed    Modified Rankin (Stroke Patients Only)       Balance Overall balance assessment: Needs assistance Sitting-balance support: No upper extremity supported, Feet supported Sitting balance-Leahy Scale: Fair       Standing balance-Leahy Scale: Poor Standing balance comment: requires consistent physical assist to maintain balance  Communication Communication Communication: No apparent difficulties  Cognition Arousal: Alert Behavior During Therapy: WFL for tasks assessed/performed   PT - Cognitive impairments: History of cognitive impairments, Memory, Attention, Safety/Judgement                         Following commands: Impaired Following commands impaired: Follows one step commands inconsistently    Cueing Cueing Techniques: Verbal cues, Visual cues, Gestural cues, Tactile cues  Exercises General Exercises - Lower  Extremity Ankle Circles/Pumps: AROM, 10 reps, Left Long Arc Quad: AROM, 10 reps, Both, Seated    General Comments        Pertinent Vitals/Pain Pain Assessment Pain Assessment: Faces Faces Pain Scale: Hurts worst Pain Location: L foot pain with WB Pain Descriptors / Indicators: Grimacing, Guarding, Stabbing Pain Intervention(s): Limited activity within patient's tolerance, Monitored during session, Repositioned, Patient requesting pain meds-RN notified    Home Living Family/patient expects to be discharged to:: Private residence Living Arrangements: Children (Son) Available Help at Discharge: Family Type of Home: House Home Access: Stairs to enter Entrance Stairs-Rails: Right Entrance Stairs-Number of Steps: 3   Home Layout: Two level;Able to live on main level with bedroom/bathroom Home Equipment: Educational psychologist (4 wheels)      Prior Function            PT Goals (current goals can now be found in the care plan section) Acute Rehab PT Goals Patient Stated Goal: to be able to go home to [name] (dog) PT Goal Formulation: With patient Time For Goal Achievement: 07/27/24 Potential to Achieve Goals: Good Progress towards PT goals: Progressing toward goals    Frequency    Min 3X/week      PT Plan      Co-evaluation              AM-PAC PT 6 Clicks Mobility   Outcome Measure  Help needed turning from your back to your side while in a flat bed without using bedrails?: None Help needed moving from lying on your back to sitting on the side of a flat bed without using bedrails?: A Little Help needed moving to and from a bed to a chair (including a wheelchair)?: A Lot Help needed standing up from a chair using your arms (e.g., wheelchair or bedside chair)?: A Little Help needed to walk in hospital room?: A Lot Help needed climbing 3-5 steps with a railing? : Total 6 Click Score: 15    End of Session Equipment Utilized During Treatment: Gait  belt Activity Tolerance: Patient limited by pain Patient left: in chair;with call bell/phone within reach;with chair alarm set Nurse Communication: Mobility status PT Visit Diagnosis: Unsteadiness on feet (R26.81);Other abnormalities of gait and mobility (R26.89);Muscle weakness (generalized) (M62.81);Difficulty in walking, not elsewhere classified (R26.2);Pain Pain - Right/Left: Left Pain - part of body: Leg;Ankle and joints of foot     Time: 8373-8347 PT Time Calculation (min) (ACUTE ONLY): 26 min  Charges:    $Therapeutic Exercise: 8-22 mins $Therapeutic Activity: 8-22 mins PT General Charges $$ ACUTE PT VISIT: 1 Visit                     [place], PT Acute Rehabilitation Services Office: [phone] 07/14/2024    Stann DELENA Ohara 07/14/2024, 5:02 PM

## 2024-07-14 NOTE — Plan of Care (Signed)

## 2024-07-14 NOTE — Progress Notes (Signed)
 [facility] PROGRESS NOTE   Patricia Peck FMW:[id number] DOB: August 05, 1946 DOA: 07/12/2024  PCP: Katheen Roselie Rockford, NP  Brief History: 78 y.o. female with medical history significant for hypertension, hyperlipidemia, history of TIA, PAF on Xarelto , GERD, chronic abdominal pain, and chronic back pain who presents with worsening in her abdominal and back pain. Patient reports that she has been experiencing generalized abdominal pain and back pain for many years.  Pain has been worsening recently, was better when she was taking a muscle relaxant, but she ran out of the muscle relaxer and pain has been uncontrolled.  Pain is in the same location and of the same character as her chronic pain, severity has increased.  She follows with [facility] and is being considered for esophageal dilation.  She denies any dysphagia at this time. CT of the abdomen and pelvis is notable for subtle urothelial enhancement in the left renal pelvis, hiatal hernia, and minimal stool.    Consultants: None  Procedures: None    Subjective/Interval History: Patient mentioned that her abdominal pain has improved.  She is complaining of pain in her left foot along with some swelling.  Denies any injuries to the foot.  Tolerating her diet.  Still constipated.   Assessment/Plan:  Acute on chronic abdominal and back pain Etiology unclear.  WBC was noted to be elevated.  She had normal lactic acid levels.  UA did not suggest infection however CT scan raised concern for urothelial enhancement in the left renal pelvis.  No other abnormalities noted. Recently seen by gastroenterology in August.  This was for heartburn.  No recent upper endoscopy noted.  Upper GI series was ordered at that time of her outpatient visit.  This was done on September 2 which showed small type I hiatal hernia with mild gastroesophageal reflux.  Cervical esophageal web noted.  Probable widely patent distal esophageal mucosal ring was noted the  barium tablet passed through this region without delay. She was empirically started on antibiotics for presumed UTI. She also mentions that she has been constipated for the last 7 days although has had poor oral intake in the last 7 days.  Home medication list has been reviewed.  She is noted to be on PPI twice a day.  Not noted to be on any other pain medications at this time. PPI is being continued.  She will be continued on antibiotics.  Pain seems to be better today.  WBC slightly better today.  She is afebrile. Continue with laxatives.  May need enema/suppository. Continue to monitor WBC.  Left foot pain No erythema noted on examination.  Mild swelling is noted.  Good pedal pulses.  Will get x-ray.  SIRS Elevated WBC heart rate and respiratory rate was noted in the ED. She is being treated for presumed UTI as discussed above.  Paroxysmal atrial fibrillation Noted to be on metoprolol  and Xarelto .  To on hold in case procedure is needed.  Essential hypertension Noted to be on amlodipine  and metoprolol .  Blood pressure is reasonably well-controlled.  Hypothyroidism Continue with thyroxine.  Hypokalemia Supplement as needed.  Normocytic anemia/thrombocytosis Stable hemoglobin.  No evidence of overt bleeding.  DVT Prophylaxis: Xarelto  currently on hold.  SCDs Code Status: Full code Family Communication: Discussed with patient Disposition Plan: Continue IV antibiotics.  Home health when ready for discharge.      Medications: Scheduled:  amLODipine   5 mg Oral QPM   atorvastatin   80 mg Oral Daily   levothyroxine   50 mcg  Oral QAC breakfast   loratadine   10 mg Oral Daily   metoprolol  succinate  50 mg Oral Daily   pantoprazole   40 mg Oral BID AC   polyethylene glycol  17 g Oral Daily   senna-docusate  2 tablet Oral BID   sodium chloride  flush  3 mL Intravenous Q12H   Continuous:  sodium chloride  100 mL/hr at 07/14/24 0930   cefTRIAXone  (ROCEPHIN )  IV 1 g (07/13/24 1647)    PRN:acetaminophen  **OR** acetaminophen , HYDROmorphone  (DILAUDID ) injection, methocarbamol , ondansetron  **OR** ondansetron  (ZOFRAN ) IV, oxyCODONE , temazepam   Antibiotics: Anti-infectives (From admission, onward)    Start     Dose/Rate Route Frequency Ordered Stop   07/13/24 1600  cefTRIAXone  (ROCEPHIN ) 1 g in sodium chloride  0.9 % 100 mL IVPB        1 g 200 mL/hr over 30 Minutes Intravenous Every 24 hours 07/12/24 2055     07/12/24 1615  cefTRIAXone  (ROCEPHIN ) 1 g in sodium chloride  0.9 % 100 mL IVPB        1 g 200 mL/hr over 30 Minutes Intravenous  Once 07/12/24 1602 07/12/24 1713   07/12/24 1615  metroNIDAZOLE  (FLAGYL ) IVPB 500 mg        500 mg 100 mL/hr over 60 Minutes Intravenous  Once 07/12/24 1602 07/12/24 1844       Objective:  Vital Signs  Vitals:   07/13/24 0753 07/13/24 1411 07/13/24 2006 07/14/24 0439  BP:  100/82 (!) 137/90 (!) 146/94  Pulse: 86 61 (!) 107 100  Resp:  18 16 14   Temp:  98.5 F (36.9 C) 98.6 F (37 C) 98.6 F (37 C)  TempSrc:      SpO2: 97% 99% 94% 92%  Height:        General appearance: Awake alert.  In no distress Resp: Clear to auscultation bilaterally.  Normal effort Cardio: S1-S2 is normal regular.  No S3-S4.  No rubs murmurs or bruit GI: Abdomen is soft.  Nontender nondistended.  Bowel sounds are present normal.  No masses organomegaly Extremities: Mild swelling of the left foot is noted.  No erythema.  Tenderness to palpation noted but no specific area of concern identified.  Good pedal pulses.   Lab Results:  Data Reviewed: I have personally reviewed following labs and reports of the imaging studies  CBC: Recent Labs  Lab 07/12/24 1125 07/13/24 0522 07/14/24 0529  WBC 20.7* 19.0* 18.1*  NEUTROABS 17.6*  --   --   HGB 11.8* 11.2* 11.1*  HCT 34.6* 34.4* 35.3*  MCV 87.2 91.7 91.2  PLT 469* 511* 550*    Basic Metabolic Panel: Recent Labs  Lab 07/12/24 1125 07/13/24 0522 07/14/24 0529  NA 136 140 134*  K 3.1* 4.3  3.6  CL 97* 104 98  CO2 21* 21* 21*  GLUCOSE 114* 99 93  BUN 17 13 10   CREATININE 0.70 0.58 0.64  CALCIUM  9.5 8.8* 8.7*  MG 2.0  --   --     GFR: CrCl cannot be calculated (Unknown ideal weight.).  Liver Function Tests: Recent Labs  Lab 07/12/24 1125  AST 26  ALT 18  ALKPHOS 113  BILITOT 1.0  PROT 7.8  ALBUMIN 3.8    Recent Labs  Lab 07/12/24 1125  LIPASE 24    Recent Results (from the past 240 hours)  Urine Culture     Status: None   Collection Time: 07/12/24  2:48 PM   Specimen: Urine, Catheterized  Result Value Ref Range Status   Specimen Description  Final    URINE, CATHETERIZED Performed at [facility], [address], [place], [place] [address]    Special Requests   Final    NONE Performed at [facility], [address] [place], [place] [address]    Culture   Final    NO GROWTH Performed at [facility], [address], [place], [place] [address]    Report Status 07/13/2024 FINAL  Final  Resp panel by RT-PCR (RSV, Flu A&B, Covid) Anterior Nasal Swab     Status: None   Collection Time: 07/12/24  3:53 PM   Specimen: Anterior Nasal Swab  Result Value Ref Range Status   SARS Coronavirus 2 by RT PCR NEGATIVE NEGATIVE Final    Comment: (NOTE) SARS-CoV-2 target nucleic acids are NOT DETECTED.  The SARS-CoV-2 RNA is generally detectable in upper respiratory specimens during the acute phase of infection. The lowest concentration of SARS-CoV-2 viral copies this assay can detect is 138 copies/mL. A negative result does not preclude SARS-Cov-2 infection and should not be used as the sole basis for treatment or other patient management decisions. A negative result may occur with  improper specimen collection/handling, submission of specimen other than nasopharyngeal swab, presence of viral mutation(s) within the areas targeted by this assay, and inadequate number of viral copies(<138 copies/mL). A negative  result must be combined with clinical observations, patient history, and epidemiological information. The expected result is Negative.  Fact Sheet for Patients:  BloggerCourse.com  Fact Sheet for Healthcare Providers:  SeriousBroker.it  This test is no t yet approved or cleared by the [place]  FDA and  has been authorized for detection and/or diagnosis of SARS-CoV-2 by FDA under an Emergency Use Authorization (EUA). This EUA will remain  in effect (meaning this test can be used) for the duration of the COVID-19 declaration under Section 564(b)(1) of the Act, 21 U.S.C.section 360bbb-3(b)(1), unless the authorization is terminated  or revoked sooner.       Influenza A by PCR NEGATIVE NEGATIVE Final   Influenza B by PCR NEGATIVE NEGATIVE Final    Comment: (NOTE) The Xpert Xpress SARS-CoV-2/FLU/RSV plus assay is intended as an aid in the diagnosis of influenza from Nasopharyngeal swab specimens and should not be used as a sole basis for treatment. Nasal washings and aspirates are unacceptable for Xpert Xpress SARS-CoV-2/FLU/RSV testing.  Fact Sheet for Patients: BloggerCourse.com  Fact Sheet for Healthcare Providers: SeriousBroker.it  This test is not yet approved or cleared by the [place]  FDA and has been authorized for detection and/or diagnosis of SARS-CoV-2 by FDA under an Emergency Use Authorization (EUA). This EUA will remain in effect (meaning this test can be used) for the duration of the COVID-19 declaration under Section 564(b)(1) of the Act, 21 U.S.C. section 360bbb-3(b)(1), unless the authorization is terminated or revoked.     Resp Syncytial Virus by PCR NEGATIVE NEGATIVE Final    Comment: (NOTE) Fact Sheet for Patients: BloggerCourse.com  Fact Sheet for Healthcare Providers: SeriousBroker.it  This  test is not yet approved or cleared by the [place]  FDA and has been authorized for detection and/or diagnosis of SARS-CoV-2 by FDA under an Emergency Use Authorization (EUA). This EUA will remain in effect (meaning this test can be used) for the duration of the COVID-19 declaration under Section 564(b)(1) of the Act, 21 U.S.C. section 360bbb-3(b)(1), unless the authorization is terminated or revoked.  Performed at [facility], [address] [place], [place] [address]   Blood culture (routine  x 2)     Status: None (Preliminary result)   Collection Time: 07/12/24  4:30 PM   Specimen: BLOOD  Result Value Ref Range Status   Specimen Description   Final    BLOOD LEFT ANTECUBITAL Performed at [facility], [address] [place], [place] [address]    Special Requests   Final    BOTTLES DRAWN AEROBIC AND ANAEROBIC Blood Culture results may not be optimal due to an inadequate volume of blood received in culture bottles Performed at [facility], [address] [place], [place] [address]    Culture   Final    NO GROWTH 2 DAYS Performed at [facility], [address] [place], [place] [address]    Report Status PENDING  Incomplete  Blood culture (routine x 2)     Status: None (Preliminary result)   Collection Time: 07/12/24  4:34 PM   Specimen: BLOOD  Result Value Ref Range Status   Specimen Description   Final    BLOOD LEFT ANTECUBITAL Performed at [facility], [address] [place], [place] [address]    Special Requests   Final    BOTTLES DRAWN AEROBIC AND ANAEROBIC Blood Culture results may not be optimal due to an inadequate volume of blood received in culture bottles Performed at [facility], [address], [place], [place] [address]    Culture   Final    NO GROWTH 2 DAYS Performed at [facility], [address], [place], [place] [address]    Report  Status PENDING  Incomplete      Radiology Studies: DG Chest 2 View Result Date: 07/12/2024 CLINICAL DATA:  abd pain, elevated wbc, eval for PNA. EXAM: CHEST - 2 VIEW COMPARISON:  07/23/2017. FINDINGS: Bilateral lung fields are clear. Bilateral costophrenic angles are clear. Stable cardio-mediastinal silhouette. No acute osseous abnormalities. The soft tissues are within normal limits. IMPRESSION: No active cardiopulmonary disease. Electronically Signed   By: Ree Molt M.D.   On: 07/12/2024 16:19   CT ABDOMEN PELVIS W CONTRAST Result Date: 07/12/2024 CLINICAL DATA:  Abdominal pain, acute, nonlocalized abd pain, constipation EXAM: CT ABDOMEN AND PELVIS WITH CONTRAST TECHNIQUE: Multidetector CT imaging of the abdomen and pelvis was performed using the standard protocol following bolus administration of intravenous contrast. RADIATION DOSE REDUCTION: This exam was performed according to the departmental dose-optimization program which includes automated exposure control, adjustment of the mA and/or kV according to patient size and/or use of iterative reconstruction technique. CONTRAST:  OMNIPAQUE  IOHEXOL  300 MG/ML  SOLN COMPARISON:  07/05/2024, 03/15/2024 FINDINGS: Lower chest: Trace bilateral pleural effusions with bibasilar atelectasis. Fibrolinear scarring in the right middle lobe. Hepatobiliary: No mass.Scattered subcentimeter hepatic hypodensities, too small to definitively characterize, but likely small cysts or biliary hematomas.No radiopaque stones or wall thickening of the gallbladder. No intrahepatic or extrahepatic biliary ductal dilation. The portal veins are patent. Pancreas: No mass or main ductal dilation. No peripancreatic inflammation or fluid collection. Spleen: Normal size. No mass. Adrenals/Urinary Tract: No adrenal masses. No renal mass. Subcentimeter hypodensities are noted in the kidneys, too small to definitively characterize, but likely small cysts. Subtle urothelial  enhancement noted in the left renal pelvis. No renal stones or hydronephrosis. The urinary bladder is distended without focal abnormality. Stomach/Bowel: Moderate-sized sliding-type hiatal hernia. 3.4 cm periampullary duodenum diverticulum. The stomach is decompressed without focal abnormality. No small bowel wall thickening or inflammation. No small bowel obstruction.The appendix was not visualized. No right lower quadrant  or pericecal inflammatory changes to suggest acute appendicitis. Sigmoid colonic diverticulosis. No changes of acute diverticulitis. Dense contrast filling the colon. Minimal stool in the ascending colon. Vascular/Lymphatic: No aortic aneurysm. Diffuse aortoiliac atherosclerosis. No intraabdominal or pelvic lymphadenopathy. Reproductive: Hysterectomy. No concerning adnexal mass.No free pelvic fluid. Other: No pneumoperitoneum, ascites, or mesenteric inflammation. Musculoskeletal: No acute fracture or destructive lesion. Multilevel degenerative disc disease of the spine. Unchanged, moderate, grade 2 anterolisthesis of L5 on S1 with bilateral L5 pars interarticularis defects. IMPRESSION: 1. Subtle urothelial enhancement noted in the left renal pelvis, worrisome for an ascending urinary tract infection. Correlation with urinalysis recommended for further characterization. No hydronephrosis or nephrolithiasis. 2. Moderate-sized sliding-type hiatal hernia. Periampullary duodenum and colonic diverticulosis. No changes of acute diverticulitis. 3. The colon is full of hyperdense contrast material. Minimal stool noted in the ascending colon. 4. Trace bilateral pleural effusions. Aortic Atherosclerosis (ICD10-I70.0). Electronically Signed   By: Rogelia Myers M.D.   On: 07/12/2024 14:04       LOS: 0 days   Marlena Barbato Verdene  [facility] Pager on [email or web address]  07/14/2024, 9:43 AM

## 2024-07-14 NOTE — Evaluation (Signed)
 Occupational Therapy Evaluation Patient Details Name: Patricia Peck MRN: [id number] DOB: Nov 05, 1945 Today's Date: 07/14/2024   History of Present Illness   78 yr old female presented to the hospital with abdominal and back pain. Pt was found to have suspected UTI and SIRS. PMH: PAF, HTN, TIA, hypothyroidism, arthritis, depression, GERD, HLD, and OP.     Clinical Impressions The pt is currently presenting below her baseline level of functioning for self-care management. She is limited by the below listed deficits (see OT problem list). During the session, she required min-mod assist for bed mobility, min assist to maintain sitting balance EOB, and mod assist for donning her socks seated EOB. She presented with intermittent anxiousness, distractibility/impaired attention, and impaired problem solving. She will benefit from further OT services to maximize her safety and independence with self-care tasks and to decrease the risk for restricted participation in meaningful activities. The pt and her son decline SNF rehab and prefer for the pt to return home with home health therapy at discharge.      If plan is discharge home, recommend the following:   Supervision due to cognitive status;Direct supervision/assist for medications management;Assistance with cooking/housework;Help with stairs or ramp for entrance;Direct supervision/assist for financial management;A lot of help with bathing/dressing/bathroom     Functional Status Assessment   Patient has had a recent decline in their functional status and demonstrates the ability to make significant improvements in function in a reasonable and predictable amount of time.     Equipment Recommendations   Other (comment) (to be determined pending functional progress)     Recommendations for Other Services         Precautions/Restrictions   Precautions Precautions: Fall Restrictions Weight Bearing Restrictions Per Provider Order:  No     Mobility Bed Mobility Overal bed mobility: Needs Assistance Bed Mobility: Supine to Sit, Sit to Supine     Supine to sit: Min assist, HOB elevated, Used rails Sit to supine: Mod assist   General bed mobility comments: she required cues for sequencing, memory/recall, and attention, as she was distractible and anxious and subsequently had difficulty with following commands to perform bed mobility           Balance     Sitting balance-Leahy Scale: Poor Sitting balance - Comments: frequent lateral and posterior leaning, requiring min assist and cues to correct          ADL either performed or assessed with clinical judgement   ADL Overall ADL's : Needs assistance/impaired Eating/Feeding: Set up;Bed level   Grooming: Minimal assistance;Cueing for safety;Sitting Grooming Details (indicate cue type and reason): The pt required min assist with frequent cues to correct lateral and posterior leaning seated EOB. While EOB, she performed face washing and teeth brushing. She also required increased multi-modal cues for short-term memory and attention to tasks. Upper Body Bathing: Set up;Supervision/ safety;Bed level Upper Body Bathing Details (indicate cue type and reason): based clincal judgement Lower Body Bathing: Minimal assistance;Bed level Lower Body Bathing Details (indicate cue type and reason): based clincal judgement Upper Body Dressing : Minimal assistance;Bed level;Cueing for safety;Cueing for sequencing   Lower Body Dressing: Moderate assistance;Sitting/lateral leans                        Pertinent Vitals/Pain Pain Assessment Pain Assessment: Faces Pain Score: 6  Pain Location: She reported alot of L foot pain. Pain Descriptors / Indicators: Grimacing, Guarding Pain Intervention(s): Limited activity within patient's tolerance, Monitored during session,  Repositioned     Extremity/Trunk Assessment Upper Extremity Assessment Upper Extremity  Assessment: RUE deficits/detail;LUE deficits/detail RUE Deficits / Details: AROM WFL. Functional grip strength LUE Deficits / Details: AROM WFL. Functional grip strength   Lower Extremity Assessment Lower Extremity Assessment: RLE deficits/detail;LLE deficits/detail RLE Deficits / Details: AROM WFL LLE Deficits / Details: AROM WFL. Acute L foot pain reported (x-ray from today was negative for fracture)       Communication     Cognition Arousal: Alert Behavior During Therapy: Restless, Impulsive             Executive functioning impairment (select all impairments): Organization, Initiation, Sequencing, Problem solving OT - Cognition Comments: Oriented to person, place, month, and year. Impaired attention. Easily distracted.                 Following commands: Impaired Following commands impaired: Follows one step commands inconsistently     Cueing  General Comments   Cueing Techniques: Verbal cues;Visual cues;Gestural cues;Tactile cues              Home Living Family/patient expects to be discharged to:: Private residence Living Arrangements: Children (Son) Available Help at Discharge: Family Type of Home: House Home Access: Stairs to enter Secretary/administrator of Steps: 3 Entrance Stairs-Rails: Right Home Layout: Two level;Able to live on main level with bedroom/bathroom     Bathroom Shower/Tub: Tub/shower unit;Walk-in shower         Home Equipment: Educational psychologist (4 wheels)          Prior Functioning/Environment Prior Level of Function : Driving;Independent/Modified Independent             Mobility Comments:  (Independent with ambulation.) ADLs Comments:  (The pt was independent with ADLs, they hire a housekeeper 2x per month, and she shared the cooking with her son. The pt did limited driving.)    OT Problem List: Decreased strength;Decreased activity tolerance;Impaired balance (sitting and/or standing);Decreased  cognition;Decreased safety awareness;Decreased knowledge of use of DME or AE;Pain   OT Treatment/Interventions: Self-care/ADL training;Therapeutic exercise;Therapeutic activities;Cognitive remediation/compensation;Energy conservation;DME and/or AE instruction;Patient/family education;Balance training      OT Goals(Current goals can be found in the care plan section)   Acute Rehab OT Goals OT Goal Formulation: With patient/family Time For Goal Achievement: 07/28/24 Potential to Achieve Goals: Good ADL Goals Pt Will Perform Upper Body Dressing: with set-up;sitting Pt Will Perform Lower Body Dressing: with supervision;with set-up;sit to/from stand;sitting/lateral leans Pt Will Transfer to Toilet: with supervision;ambulating Pt Will Perform Toileting - Clothing Manipulation and hygiene: with supervision;sit to/from stand   OT Frequency:  Min 2X/week       AM-PAC OT 6 Clicks Daily Activity     Outcome Measure Help from another person eating meals?: A Little Help from another person taking care of personal grooming?: A Little Help from another person toileting, which includes using toliet, bedpan, or urinal?: A Lot Help from another person bathing (including washing, rinsing, drying)?: A Lot Help from another person to put on and taking off regular upper body clothing?: A Little Help from another person to put on and taking off regular lower body clothing?: A Lot 6 Click Score: 15   End of Session Equipment Utilized During Treatment: Other (comment) (N/A) Nurse Communication: Mobility status  Activity Tolerance: Patient limited by pain Patient left: in bed;with call bell/phone within reach;with bed alarm set;with family/visitor present  OT Visit Diagnosis: Other abnormalities of gait and mobility (R26.89);Muscle weakness (generalized) (M62.81);Pain Pain - Right/Left: Left Pain - part  of body:  (foot)                Time: 8949-8878 OT Time Calculation (min): 31 min Charges:  OT  General Charges $OT Visit: 1 Visit OT Evaluation $OT Eval Moderate Complexity: 1 Mod OT Treatments $Therapeutic Activity: 8-22 mins    Delanna JINNY Lesches, OTR/L 07/14/2024, 1:45 PM

## 2024-07-15 ENCOUNTER — Observation Stay (HOSPITAL_COMMUNITY)

## 2024-07-15 DIAGNOSIS — R1030 Lower abdominal pain, unspecified: Secondary | ICD-10-CM | POA: Diagnosis not present

## 2024-07-15 DIAGNOSIS — R1013 Epigastric pain: Secondary | ICD-10-CM | POA: Diagnosis not present

## 2024-07-15 DIAGNOSIS — K5900 Constipation, unspecified: Secondary | ICD-10-CM | POA: Diagnosis not present

## 2024-07-15 DIAGNOSIS — D72829 Elevated white blood cell count, unspecified: Secondary | ICD-10-CM | POA: Diagnosis not present

## 2024-07-15 DIAGNOSIS — D649 Anemia, unspecified: Secondary | ICD-10-CM | POA: Diagnosis not present

## 2024-07-15 DIAGNOSIS — R1084 Generalized abdominal pain: Secondary | ICD-10-CM

## 2024-07-15 DIAGNOSIS — R109 Unspecified abdominal pain: Secondary | ICD-10-CM | POA: Diagnosis not present

## 2024-07-15 DIAGNOSIS — I482 Chronic atrial fibrillation, unspecified: Secondary | ICD-10-CM | POA: Diagnosis not present

## 2024-07-15 DIAGNOSIS — E876 Hypokalemia: Secondary | ICD-10-CM | POA: Diagnosis not present

## 2024-07-15 LAB — URIC ACID: Uric Acid, Serum: 2.8 mg/dL (ref 2.5–7.1)

## 2024-07-15 LAB — CBC
HCT: 30.2 % — ABNORMAL LOW (ref 36.0–46.0)
Hemoglobin: 9.7 g/dL — ABNORMAL LOW (ref 12.0–15.0)
MCH: 29.7 pg (ref 26.0–34.0)
MCHC: 32.1 g/dL (ref 30.0–36.0)
MCV: 92.4 fL (ref 80.0–100.0)
Platelets: 519 K/uL — ABNORMAL HIGH (ref 150–400)
RBC: 3.27 MIL/uL — ABNORMAL LOW (ref 3.87–5.11)
RDW: 13.2 % (ref 11.5–15.5)
WBC: 14.2 K/uL — ABNORMAL HIGH (ref 4.0–10.5)
nRBC: 0 % (ref 0.0–0.2)

## 2024-07-15 LAB — BASIC METABOLIC PANEL WITH GFR
Anion gap: 13 (ref 5–15)
BUN: 9 mg/dL (ref 8–23)
CO2: 18 mmol/L — ABNORMAL LOW (ref 22–32)
Calcium: 7.8 mg/dL — ABNORMAL LOW (ref 8.9–10.3)
Chloride: 101 mmol/L (ref 98–111)
Creatinine, Ser: 0.58 mg/dL (ref 0.44–1.00)
GFR, Estimated: >60 mL/min (ref >60)
Glucose, Bld: 93 mg/dL (ref 70–99)
Potassium: 3.8 mmol/L (ref 3.5–5.1)
Sodium: 132 mmol/L — ABNORMAL LOW (ref 135–145)

## 2024-07-15 LAB — MAGNESIUM: Magnesium: 2.1 mg/dL (ref 1.7–2.4)

## 2024-07-15 MED ORDER — SMOG ENEMA
960.0000 mL | Freq: Once | RECTAL | Status: AC
  Administered 2024-07-15: 960 mL via RECTAL
  Filled 2024-07-15: qty 960

## 2024-07-15 MED ORDER — METOPROLOL TARTRATE 5 MG/5ML IV SOLN
2.5000 mg | Freq: Once | INTRAVENOUS | Status: AC
  Administered 2024-07-15: 2.5 mg via INTRAVENOUS
  Filled 2024-07-15: qty 5

## 2024-07-15 MED ORDER — SODIUM CHLORIDE 0.9 % IV SOLN: INTRAVENOUS | Status: DC

## 2024-07-15 NOTE — Consult Note (Addendum)
 Consultation  Referring Provider:     Dr. Verdene Primary Care Physician:  Katheen Roselie Rockford, NP Primary Gastroenterologist: Assigned to Dr. Avram        Reason for Consultation: Abdominal pain            HPI:   Patricia Peck is a 78 y.o. female with a past medical history as listed below including hypertension, hyperlipidemia, moderate sized hiatal hernia, history of TIA in 2017, PAF on Xarelto  (echo 2//22 with LVEF 55-60%), GERD, chronic abdominal pain and chronic back pain who presented to the hospital with worsening abdominal and back pain.    05/17/2024 patient evaluated by [facility].  At that time noted recurrent UTIs since 2023.  Negative renal ultrasound and cystoscopy in November 2023.  She had failed Myrbetriq and was doing well on trimethoprim  suppression therapy.  At time of that visit noted to be constipated with some belly swelling.    At time of presentation patient described generalized abdominal pain and into the back for many years but had been worse lately, apparently better when she took a muscle relaxant but she had run out.  Abdominal pain generalized back pain on the low back, in the middle and along both sides of her spine.    Today, patient seen with family member by bedside, I see the patient just after she received a dose of IV Dilaudid , so her history is a little hazy.  She describes that she always has generalized abdominal pain that radiates into her back, but at home she only uses as needed Tylenol  or Ibuprofen.  Denies chronic Ibuprofen use, no more than once a week.  In general she tries to avoid pain meds and just deals with the pain.  This pain is an exacerbation of what she always feels, apparently was getting better until yesterday morning and then worsened today.  Does tell me that she has not had a bowel movement in 3 days which she knows is contributing.  Was given some laxatives yesterday by the nursing staff but these have not worked yet.  This  morning one of her largest complaints is of hand and foot pain.  Denies any issues with dysphagia.    Denies fever, chills or weight loss.  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, minimal stool in the ascending colon and minimal stool, labs with a potassium of 3.1, normal creatinine, WBC 20,700, platelets 269, normal LFTs and normal lipase (07/12/2024 urine culture showed no growth)  GI history: 07/05/2024 upper GI with a small type I hiatal hernia, mild GERD, cervical esophageal web, probably widely patent distal esophageal mucosal ring, barium tablet passed through without delay, mild cardiomegaly 06/17/2024 office visit with Lauraine Pa, PA: at that time discussed history of moderate-sized hiatal hernia and severe heartburn indigestion, Nexium  had been increased to 40 mg daily and Famotidine  increased to 40 mg at night with Carafate  added for breakthrough heartburn, weight loss of around 9 pounds and some constipation advised to take MiraLAX , had then been switched to Pantoprazole  40 twice daily at that time continue discussed intermittent heartburn a couple times a week with some improvement on Protonix , at time of that visit her weight was stable.  Constipation better alternating between MiraLAX  and herbal tea; at that time scheduled for upper GI series and possible EGD continued on Protonix  40 twice daily and Famotidine   nightly, continued on MiraLAX , recommended MRI for further follow-up of liver lesions 03/15/2024 CTAP with moderate size sliding-type hiatal hernia and 2 small round low densities in the right hepatic lobe uncertain etiology, MRI recommended to evaluate for possible metastatic disease, grade 2 anterior listhesis of L5-S1 secondary to bilateral L5 spondylosis and aortic atherosclerosis 2015 colonoscopy normal aside from mild diverticulosis in the sigmoid  colon  Past Medical History:  Diagnosis Date   Allergy    Arthritis    hands   Asthma    uses an inhaler   Atrial fibrillation (HCC)    Atrial flutter (HCC)    Depression    Dysrhythmia    a-fib, on Xarelto    GERD (gastroesophageal reflux disease)    Hyperlipidemia    Hypertension    Hypotension 03/24/2024   Hypothyroidism    Osteoporosis    Stroke (HCC) 2017   TIA   TIA (transient ischemic attack)     Past Surgical History:  Procedure Laterality Date   ABDOMINAL HYSTERECTOMY     COLONOSCOPY     MYRINGOTOMY WITH TUBE PLACEMENT Right 01/14/2016   Procedure: RIGHT MYRINGOTOMY WITH T-TUBE PLACEMENT;  Surgeon: Daniel Moccasin, MD;  Location: [facility];  Service: ENT;  Laterality: Right;   PARTIAL HYSTERECTOMY  1989   ROBOTIC ASSISTED BILATERAL SALPINGO OOPHERECTOMY Bilateral 02/14/2021   Procedure: XI ROBOTIC ASSISTED BILATERAL SALPINGO OOPHORECTOMY;  Surgeon: Eloy Herring, MD;  Location: [facility];  Service: Gynecology;  Laterality: Bilateral;   TEE WITHOUT CARDIOVERSION N/A 12/22/2012   Procedure: TRANSESOPHAGEAL ECHOCARDIOGRAM (TEE);  Surgeon: Redell GORMAN Shallow, MD;  Location: [facility];  Service: Cardiovascular;  Laterality: N/A;    Family History  Problem Relation Age of Onset   Breast cancer Mother    Drug abuse Son    Depression Son    Stroke Paternal Aunt    Heart disease Paternal Grandfather    Diabetes Maternal Grandfather    Colon cancer Neg Hx    Ovarian cancer Neg Hx    Endometrial cancer Neg Hx    Pancreatic cancer Neg Hx    Prostate cancer Neg Hx     Social History   Tobacco Use   Smoking status: Never    Passive exposure: Never   Smokeless tobacco: Never  Vaping Use   Vaping status: Never Used  Substance Use Topics   Alcohol use: Not Currently    Comment: Rare   Drug use: No    Prior to Admission medications   Medication Sig Start Date End Date Taking? Authorizing Provider  Alpha-D-Galactosidase (BEANO PO) Take 1 tablet by mouth 2  (two) times daily.   Yes [provider]  amLODipine  (NORVASC ) 5 MG tablet Take 1 tablet (5 mg total) by mouth every evening. 05/05/24  Yes Nche, Roselie Rockford, NP  atorvastatin  (LIPITOR) 80 MG tablet Take 1 tablet (80 mg total) by mouth daily. 05/17/24  Yes Shallow Redell GORMAN, MD  Benzocaine -Resorcinol (ANTI-ITCH VAGINAL) 20-3 % CREA Apply 1 application  topically 2 (two) times daily as needed (irritation, itching).   Yes [provider]  bumetanide  (BUMEX ) 1 MG tablet Take 1 tablet (1 mg total) by mouth daily as needed. For leg and ankle swelling. Take with potassium chloride  10mEq 05/05/24  Yes Nche, Charlotte Lum, NP  Calcium  Carb-Cholecalciferol (CALCIUM  + VITAMIN D3 PO) Take 1 tablet by mouth 2 (two) times daily.   Yes [provider]  Cholecalciferol (VITAMIN D -3 PO) Take 1 capsule by mouth See admin instructions. Take 1  capsule by mouth on Tuesday, Thursday, Saturday.   Yes [provider]  CRANBERRY PO Take 2 capsules by mouth daily.   Yes [provider]  denosumab  (PROLIA ) 60 MG/ML SOLN injection Inject 60 mg into the skin once. Administer in upper arm, thigh, or abdomen 08/06/15  Yes Aron, Talia M, MD  estradiol (ESTRACE) 0.1 MG/GM vaginal cream Place 1 Applicatorful vaginally 3 (three) times a week. 01/11/21  Yes [provider]  ezetimibe  (ZETIA ) 10 MG tablet Take 1 tablet (10 mg total) by mouth daily. 08/12/23  Yes Pietro Redell RAMAN, MD  famotidine  (PEPCID ) 40 MG tablet TAKE 1 TABLET(40 MG) BY MOUTH AT BEDTIME 07/05/24  Yes Sebastian Beverley NOVAK, MD  levothyroxine  (SYNTHROID ) 50 MCG tablet Take 1 tablet (50 mcg total) by mouth daily before breakfast. 05/09/24  Yes Nche, Roselie Rockford, NP  loratadine  (CLARITIN ) 10 MG tablet Take 10 mg by mouth daily.   Yes [provider]  Meth-Hyo-M Bl-Na Phos-Ph Sal (URO-MP) 118 MG CAPS Take 2 capsules by mouth 2 (two) times daily.   Yes [provider]  metoprolol  succinate (TOPROL -XL) 50 MG 24 hr  tablet TAKE 1 TABLET(50 MG) BY MOUTH DAILY 06/15/24  Yes Pietro Redell RAMAN, MD  montelukast  (SINGULAIR ) 10 MG tablet TAKE 1 TABLET(10 MG) BY MOUTH AT BEDTIME 11/17/23  Yes Nche, Roselie Rockford, NP  nitrofurantoin  (MACRODANTIN ) 100 MG capsule Take 100 mg by mouth daily.   Yes [provider]  pantoprazole  (PROTONIX ) 40 MG tablet Take 1 tablet (40 mg total) by mouth 2 (two) times daily before a meal. 05/05/24  Yes Nche, Roselie Rockford, NP  polyethylene glycol powder (GLYCOLAX /MIRALAX ) 17 GM/SCOOP powder Take 17 g by mouth daily. Patient taking differently: Take 17 g by mouth daily as needed (constipation). 03/24/24  Yes Sebastian Beverley NOVAK, MD  potassium chloride  (KLOR-CON ) 10 MEQ tablet Take 1 tablet (10 mEq total) by mouth daily as needed. For leg and ankle swelling. Take with bumex  1mg  Patient taking differently: Take 10 mEq by mouth daily as needed (diuretic use (Bumex )). 05/05/24  Yes Nche, Roselie Rockford, NP  temazepam  (RESTORIL ) 7.5 MG capsule TAKE 1 CAPSULE(7.5 MG) BY MOUTH AT BEDTIME 06/07/24  Yes Nche, Roselie Rockford, NP  VITAMIN E PO Take 1 capsule by mouth See admin instructions. Take 1 capsule by mouth on Tuesday, Thursday and Saturday.   Yes [provider]  XARELTO  15 MG TABS tablet TAKE 1 TABLET(15 MG) BY MOUTH DAILY WITH SUPPER 05/10/24  Yes Crenshaw, Redell RAMAN, MD    Current Facility-Administered Medications  Medication Dose Route Frequency Provider Last Rate Last Admin   acetaminophen  (TYLENOL ) tablet 650 mg  650 mg Oral Q6H PRN Opyd, Timothy S, MD       Or   acetaminophen  (TYLENOL ) suppository 650 mg  650 mg Rectal Q6H PRN Opyd, Timothy S, MD       amLODipine  (NORVASC ) tablet 5 mg  5 mg Oral QPM Opyd, Timothy S, MD   5 mg at 07/14/24 1657   atorvastatin  (LIPITOR) tablet 80 mg  80 mg Oral Daily Opyd, Timothy S, MD   80 mg at 07/14/24 0924   cefTRIAXone  (ROCEPHIN ) 1 g in sodium chloride  0.9 % 100 mL IVPB  1 g Intravenous Q24H Opyd, Timothy S, MD 200 mL/hr at 07/14/24 1605 1 g at  07/14/24 1605   HYDROmorphone  (DILAUDID ) injection 0.5 mg  0.5 mg Intravenous Q4H PRN Opyd, Timothy S, MD   0.5 mg at 07/14/24 2315   levothyroxine  (SYNTHROID ) tablet  50 mcg  50 mcg Oral QAC breakfast Opyd, Evalene RAMAN, MD   50 mcg at 07/15/24 9380   loratadine  (CLARITIN ) tablet 10 mg  10 mg Oral Daily Krishnan, Gokul, MD   10 mg at 07/14/24 9075   methocarbamol  (ROBAXIN ) tablet 500 mg  500 mg Oral Q6H PRN Charlton Evalene RAMAN, MD   500 mg at 07/15/24 9380   metoprolol  succinate (TOPROL -XL) 24 hr tablet 50 mg  50 mg Oral Daily Opyd, Evalene RAMAN, MD   50 mg at 07/14/24 9075   ondansetron  (ZOFRAN ) tablet 4 mg  4 mg Oral Q6H PRN Opyd, Evalene RAMAN, MD       Or   ondansetron  (ZOFRAN ) injection 4 mg  4 mg Intravenous Q6H PRN Opyd, Evalene RAMAN, MD       oxyCODONE  (Oxy IR/ROXICODONE ) immediate release tablet 5 mg  5 mg Oral Q4H PRN Opyd, Evalene RAMAN, MD   5 mg at 07/15/24 9381   pantoprazole  (PROTONIX ) EC tablet 40 mg  40 mg Oral BID AC Opyd, Evalene RAMAN, MD   40 mg at 07/14/24 1657   polyethylene glycol (MIRALAX  / GLYCOLAX ) packet 17 g  17 g Oral Daily Verdene Purchase, MD   17 g at 07/14/24 9075   senna-docusate (Senokot-S) tablet 2 tablet  2 tablet Oral BID Krishnan, Gokul, MD   2 tablet at 07/14/24 2234   sodium chloride  flush (NS) 0.9 % injection 3 mL  3 mL Intravenous Q12H Opyd, Evalene RAMAN, MD   3 mL at 07/14/24 2323   temazepam  (RESTORIL ) capsule 7.5 mg  7.5 mg Oral QHS PRN Opyd, Timothy S, MD   7.5 mg at 07/14/24 2234    Allergies as of 07/12/2024   (No Known Allergies)     Review of Systems:    Constitutional: No weight loss, fever or chills Skin: No rash  Cardiovascular: No chest pain Respiratory: No SOB Gastrointestinal: See HPI and otherwise negative Genitourinary: No dysuria currently Neurological: No headache, dizziness or syncope Musculoskeletal: +hand and foot pain Hematologic: No bleeding or bruising Psychiatric: No history of depression or anxiety    Physical Exam:  Vital signs in last 24  hours: Temp:  [98.1 F (36.7 C)-98.6 F (37 C)] 98.4 F (36.9 C) (09/12 0455) Pulse Rate:  [100-105] 105 (09/12 0455) Resp:  [14-18] 18 (09/12 0455) BP: (112-170)/(84-94) 119/91 (09/12 0455) SpO2:  [93 %-95 %] 93 % (09/12 0455) Weight:  [53.1 kg] 53.1 kg (09/11 1005) Last BM Date : 07/06/24 General:   Pleasant elderly, frail appearing Caucasian female appears to be in NAD, Well developed, Well nourished, alert and cooperative Head:  Normocephalic and atraumatic. Eyes:   PEERL, EOMI. No icterus. Conjunctiva pink. Ears:  Normal auditory acuity. Neck:  Supple Throat: Oral cavity and pharynx without inflammation, swelling or lesion Lungs: Respirations even and unlabored. Lungs clear to auscultation bilaterally.   No wheezes, crackles, or rhonchi.  Heart: Normal S1, S2. No MRG. Regular rate and rhythm. No peripheral edema, cyanosis or pallor.  Abdomen:  tense, nondistended, nontender. No rebound or guarding. Normal bowel sounds. No appreciable masses or hepatomegaly. Rectal:  Not performed.  Msk:  Symmetrical without gross deformities. Peripheral pulses intact.  Extremities:  Without edema, no deformity or joint abnormality.  Neurologic:  Alert and  oriented x4;  grossly normal neurologically.  Skin:   Dry and intact without significant lesions or rashes. Psychiatric: Demonstrates good judgement and reason without abnormal affect or behaviors.  LAB RESULTS: Recent Labs    07/13/24  0522 07/14/24 0529 07/15/24 0456  WBC 19.0* 18.1* 14.2*  HGB 11.2* 11.1* 9.7*  HCT 34.4* 35.3* 30.2*  PLT 511* 550* 519*   BMET Recent Labs    07/13/24 0522 07/14/24 0529 07/15/24 0456  NA 140 134* 132*  K 4.3 3.6 3.8  CL 104 98 101  CO2 21* 21* 18*  GLUCOSE 99 93 93  BUN 13 10 9   CREATININE 0.58 0.64 0.58  CALCIUM  8.8* 8.7* 7.8*   LFT Recent Labs    07/12/24 1125  PROT 7.8  ALBUMIN 3.8  AST 26  ALT 18  ALKPHOS 113  BILITOT 1.0   STUDIES: DG Foot 2 Views Left Result Date:  07/14/2024 CLINICAL DATA:  Left dorsal foot pain.  No known injury. EXAM: LEFT FOOT - 2 VIEW COMPARISON:  07/05/2021 FINDINGS: Stable hallux valgus and bunionette at the 5th MTP joint. Mild dorsal soft tissue swelling proximally. Mild dorsal tarsal/metatarsal spur formation with mild progression. Minimal inferior calcaneal enthesophyte. No fracture. IMPRESSION: 1. Mild dorsal soft tissue swelling proximally. 2. Mild dorsal tarsal/metatarsal dorsal spur formation with mild progression. 3. Stable hallux valgus and bunionette at the 5th MTP joint. Electronically Signed   By: Elspeth Bathe M.D.   On: 07/14/2024 11:24    Impression / Plan:   Mrs. Shibuya is a 78 year old female with a past medical historyincluding hypertension, hyperlipidemia, history of TIA, PAF on Xarelto , GERD, chronic abdominal pain and chronic back pain who presented to the hospital with worsening abdominal and back pain.  Assessment: 1.  Acute on chronic abdominal and back pain: Initially found to have a CT showing signs of UTI with SIRS (elevated WBC, heart rate and respiratory rate) and was on an antibiotic for 2 days with decreasing abdominal pain, but this morning awoke again with an increase in pain, does tell me she has not had a bowel movement in 3 days which she does feel is contributing, also does have an element of chronic pain though this feels worse per patient, no etiology for chronic pain, follows with urology for some chronic cystitis/recurrent UTIs and is on trimethoprim  therapy, abdominal x-ray reviewed from today with constipation and barium in the colon; consider IBS-C +/- constipation contributing today 2.  A-fib: On Xarelto , last dose this morning 07/15/2024 3.  Hypertension 4.  Hypothyroidism 5.  History of TIA: On Xarelto  as above 6.  Constipation: As witnessed on abdominal x-ray today, barium is still in her colon from upper GI study 07/05/2024; certainly an element of slow transit now with added pain medications  contributing 7.  Anemia: Hemoglobin 11.1--> 9.7 overnight, additional labs pending including iron studies, B12 and folate as well as reticulocytes  Plan: 1.  Will order a SMOG enema for today to see if we can get the patient really moving.  Continue other laxatives as ordered. 2.  We can reevaluate pain tomorrow. 3.  Hospitalist has started anemia workup.  Will await results.  No signs of GI bleeding per patient. 4.  Continue supportive measures.  Would try to limit opioids as these can contribute to her constipation.  She should try and get up and moving as much as she can. 5.  Continue current diet as tolerated. 6.  Okay to hold Xarelto  for now, but no immediate plans for endoscopic workup  Thank you for your kind consultation, we will continue to follow.  Delon Gibson [facility]  07/15/2024, 8:44 AM    [facility] Attending   I have taken an interval history, reviewed  the chart and examined the patient. I agree with the Advanced Practitioner's note, impression and recommendations with the following additions:  I have personally reviewed the KUB that shows significant amount of stool and retained barium in the colon.   78 year old woman with chronic pain issues recently seen in our clinic with worsening GERD which is improved, with an upper GI series showing cervical esophageal web, patent distal ring and small hiatal hernia.  Prior CT had shown 2 tiny liver lesions that do not merit further workup.  There had been some weight loss which seems to have stabilized.  She has a new normocytic anemia and appropriate workup has been ordered by the hospitalist, with iron B12 and folate studies and reticulocytes.  Wt Readings from Last 3 Encounters:  07/14/24 53.1 kg  06/17/24 53.1 kg  05/05/24 54.1 kg   She seems improved since admission.  We are going to try to empty her bowels of stool and barium to see what that does for her.  Await anemia workup.  I do not think there is any role for  endoscopic evaluation currently but that could change pending iron and B12 studies.  Dr. Albertus is covering the service this weekend and will follow-up with her tomorrow.  Husband was present for the interview and exam today and he and the patient understand the plan and all questions were answered.  Lupita CHARLENA Commander, MD, [facility] See TRACEY on call - gastroenterology for best contact person 07/15/2024 11:07 AM

## 2024-07-15 NOTE — Progress Notes (Signed)
 Physical Therapy Treatment Patient Details Name: Patricia Peck MRN: [id number] DOB: 1945-12-11 Today's Date: 07/15/2024   History of Present Illness 78 yo female presents to therapy following hospital admission secondary to abdominal and back pain. Pt reports chronic pain and recently running out of mm relaxer and endorses constipation. Pt was found to have abn labs, A-fib and HTN. Pt imaging  and suspect UTI.  Pt dx with SIRS, leukocytosis and epigastric and acute on chronic back pain. Pt PMH includes but is not limited to: PAF, HTN, TIA, hypothyroidism, arthritis, depression, GERD, HLD, and OP.    PT Comments   Pt admitted with above diagnosis.  Pt currently with functional limitations due to the deficits listed below (see PT Problem List). Pt in bed when PT arrived. Pt agreeable to therapy intervention and reported need to void bladder. PT set up for transfer tasks. Pt required extensive assist with supine to sit, pt reported R distal UE pain and noted edema at wrist and thenar eminence pt unable to engage use of R UE with mobility tasks, once seated EOB pt demonstrate rocking behavior with B UE and LE restless wryithing like movements which pt then later demonstrated once returned to supine, PT made attepts for pt to stand at [facility], pt unable to grasp RW with RUE nor WB through R UE and self limiting L LE WB, pt resistive and pushing response and unable to initiate stand with RW, trial with L HHA and PT unable to assist pt to upright standing with max A, PT then bear hugged and total A for standing. Significant change in presentation from time of eval with functional mobility, pain response and cognition. Pt required max A to return to bed. Pt reported ongoing need to void bladder, nursing provided bed pan and PT/nurse roll pt for use of bed pan with pt requiring mod A to roll L and pushing response to return to supine. PT noted L foot edema and elevated B LE at end of tx session. Nursing staff with pt  when PT exited room, pt on 2 L/min supplemental O2 and all needs in place. Due to change in pt presentation and elevated pain response pt will require extensive 24/7 assist and supervision and PT changing d/c recommendation at this time.  Patient will benefit from continued inpatient follow up therapy, <3 hours/day.  Pt will benefit from acute skilled PT to increase their independence and safety with mobility to allow discharge.      If plan is discharge home, recommend the following: A little help with walking and/or transfers;A little help with bathing/dressing/bathroom;Assistance with cooking/housework;Direct supervision/assist for medications management;Assist for transportation;Help with stairs or ramp for entrance;Supervision due to cognitive status   Can travel by private vehicle     No  Equipment Recommendations  Wheelchair (measurements PT) (20 inch standard WC with elevating and swing away leg rests and foam cushion)    Recommendations for Other Services       Precautions / Restrictions Precautions Precautions: Fall Recall of Precautions/Restrictions: Intact Restrictions Weight Bearing Restrictions Per Provider Order: No     Mobility  Bed Mobility Overal bed mobility: Needs Assistance Bed Mobility: Supine to Sit, Sit to Supine     Supine to sit: HOB elevated, Used rails, Contact guard Sit to supine: Max assist, HOB elevated   General bed mobility comments: pt requires frequent and repettaive cues for attention with poor recall, pt easily distrated and anxious and limited due to pain today    Transfers  Overall transfer level: Needs assistance Equipment used: None Transfers: Sit to/from Stand, Bed to chair/wheelchair/BSC Sit to Stand: Total assist           General transfer comment: pt reported need to use the bathroom. PT set up for transfer task with RW and pt unable to WB through R UE nor L LE at time of intervention. PT made atteps for HHA on L and not able, PT  bear hug pt into standing and pt required total A    Ambulation/Gait               General Gait Details: pt unable to participate with gait tasks today due to pain   Stairs             Wheelchair Mobility     Tilt Bed    Modified Rankin (Stroke Patients Only)       Balance Overall balance assessment: Needs assistance Sitting-balance support: No upper extremity supported, Feet supported Sitting balance-Leahy Scale: Fair Sitting balance - Comments: frequent lateral and posterior leaning as if pt is rocking and constant wrything of UE and LE when seated EOB     Standing balance-Leahy Scale: Zero Standing balance comment: requires consistent physical assist to maintain balance with pt self limiting L LE WB                            Communication Communication Communication: No apparent difficulties Factors Affecting Communication:  (word finding)  Cognition Arousal: Alert Behavior During Therapy: WFL for tasks assessed/performed   PT - Cognitive impairments: History of cognitive impairments, Memory, Attention, Safety/Judgement                         Following commands: Impaired Following commands impaired: Follows one step commands inconsistently    Cueing Cueing Techniques: Verbal cues, Visual cues, Gestural cues, Tactile cues  Exercises      General Comments General comments (skin integrity, edema, etc.): imaging negative for acute findings L LE with noted soft tissue edema, CT of abdomen and pelvis completed with urothelia enhancement and L reval pelvis with pt provided with emema  as well as pain medicaiton prior to PT arrival, pt found to be desaturated this am by nursing staff in the high 80s during therapy intervention pt 92-96% on RA and then suddenly decreased to 86% and [place] placed with 2 L/min      Pertinent Vitals/Pain Pain Assessment Pain Assessment: Faces Faces Pain Scale: Hurts worst Pain Location: pain all over  worse L foot and R distal UE Pain Descriptors / Indicators: Grimacing, Guarding, Stabbing, Moaning, Restless Pain Intervention(s): Limited activity within patient's tolerance, Monitored during session, Premedicated before session, Repositioned    Home Living                          Prior Function            PT Goals (current goals can now be found in the care plan section) Acute Rehab PT Goals Patient Stated Goal: to be able to go home to [name] (dog) PT Goal Formulation: With patient Time For Goal Achievement: 07/27/24 Potential to Achieve Goals: Good Progress towards PT goals: Not progressing toward goals - comment    Frequency    Min 3X/week      PT Plan      Co-evaluation  AM-PAC PT 6 Clicks Mobility   Outcome Measure  Help needed turning from your back to your side while in a flat bed without using bedrails?: None Help needed moving from lying on your back to sitting on the side of a flat bed without using bedrails?: A Lot Help needed moving to and from a bed to a chair (including a wheelchair)?: Total Help needed standing up from a chair using your arms (e.g., wheelchair or bedside chair)?: Total Help needed to walk in hospital room?: Total Help needed climbing 3-5 steps with a railing? : Total 6 Click Score: 10    End of Session Equipment Utilized During Treatment: Gait belt;Oxygen Activity Tolerance: Patient limited by pain Patient left: in bed;with call bell/phone within reach;with nursing/sitter in room Nurse Communication: Mobility status;Other (comment) (change in pt presentation) PT Visit Diagnosis: Unsteadiness on feet (R26.81);Other abnormalities of gait and mobility (R26.89);Muscle weakness (generalized) (M62.81);Difficulty in walking, not elsewhere classified (R26.2);Pain Pain - Right/Left: Left (L LE and R UE) Pain - part of body: Leg;Ankle and joints of foot;Shoulder;Arm     Time: 8452-8379 PT Time Calculation  (min) (ACUTE ONLY): 33 min  Charges:    $Therapeutic Activity: 23-37 mins PT General Charges $$ ACUTE PT VISIT: 1 Visit                     [place], PT Acute Rehab    [place] VEAR Drone 07/15/2024, 4:36 PM

## 2024-07-15 NOTE — Plan of Care (Signed)

## 2024-07-15 NOTE — Progress Notes (Addendum)
 [facility] PROGRESS NOTE   Patricia Peck FMW:[id number] DOB: [birth year] DOA: 07/12/2024  PCP: Katheen Roselie [place], NP  Brief History: 78 y.o. female with medical history significant for hypertension, hyperlipidemia, history of TIA, PAF on Xarelto , GERD, chronic abdominal pain, and chronic back pain who presents with worsening in her abdominal and back pain. Patient reports that she has been experiencing generalized abdominal pain and back pain for many years.  Pain has been worsening recently, was better when she was taking a muscle relaxant, but she ran out of the muscle relaxer and pain has been uncontrolled.  Pain is in the same location and of the same character as her chronic pain, severity has increased.  She follows with [facility] and is being considered for esophageal dilation.  She denies any dysphagia at this time. CT of the abdomen and pelvis is notable for subtle urothelial enhancement in the left renal pelvis, hiatal hernia, and minimal stool.    Consultants: Gastroenterology  Procedures: None    Subjective/Interval History: Patient noted to be in a lot of pain and discomfort this morning.  Complains of lower abdominal pain.  Still has not had a bowel movement.  Slightly agitated as well.    Assessment/Plan:  Acute on chronic abdominal and back pain Etiology unclear.  LFTs and lipase levels were normal.  WBC was noted to be elevated.  She had normal lactic acid levels.  UA did not suggest infection however CT scan raised concern for urothelial enhancement in the left renal pelvis.  No other abnormalities noted. Recently seen by gastroenterology in August.  This was for heartburn.  No recent upper endoscopy noted.  Upper GI series was ordered at that time of her outpatient visit.  This was done on September 2 which showed small type I hiatal hernia with mild gastroesophageal reflux.  Cervical esophageal web noted.  Probable widely patent distal esophageal mucosal ring was  noted the barium tablet passed through this region without delay. Patient has also been constipated which could be another reason for her abdominal discomfort.  She was started on laxatives. She was also empirically started on antibiotics for presumed UTI. She was still noted to be more comfortable yesterday but appears to be in a lot of discomfort this morning.  Abdominal x-ray has been ordered. Will go ahead and consult gastroenterology since patient does not appear to be improving. Continue PPI. She has been on laxatives for constipation.  Will order suppository today.  Concern for UTI WBC was 19.0.  Improved to 14.2.  UA was unremarkable however CT scan suggested UTI.  She is on ceftriaxone .  Cultures are without any growth so far.  Left foot pain No erythema noted on examination.  Mild swelling is noted.  Good pedal pulses.  X-ray showed mild soft tissue swelling.  No other acute findings noted.  Spurs were noted which could be the reason for her pain.  Uric acid level is normal at 2.8.  SIRS Elevated WBC heart rate and respiratory rate was noted in the ED. She is being treated for presumed UTI as discussed above.  Paroxysmal atrial fibrillation Noted to be on metoprolol  and Xarelto .  Xarelto  was placed on hold as it is possible that she may need to undergo GI procedure.  Essential hypertension Noted to be on amlodipine  and metoprolol .  Blood pressure is reasonably well-controlled.  Hypothyroidism Continue with levothyroxine .  Hypokalemia/hyponatremia Supplement as needed.  Monitor sodium levels closely.  Normocytic anemia/thrombocytosis Drop in hemoglobin noted  today.  No overt bleeding noted.  Likely dilutional drop.  Monitor daily for now.  Check anemia panel.  DVT Prophylaxis: Xarelto  currently on hold.  SCDs Code Status: Full code Family Communication: Discussed with patient Disposition Plan: Continue IV antibiotics.  Home health when ready for  discharge.      Medications: Scheduled:  amLODipine   5 mg Oral QPM   atorvastatin   80 mg Oral Daily   levothyroxine   50 mcg Oral QAC breakfast   loratadine   10 mg Oral Daily   metoprolol  succinate  50 mg Oral Daily   pantoprazole   40 mg Oral BID AC   polyethylene glycol  17 g Oral Daily   senna-docusate  2 tablet Oral BID   sodium chloride  flush  3 mL Intravenous Q12H   Continuous:  cefTRIAXone  (ROCEPHIN )  IV 1 g (07/14/24 1605)   PRN:acetaminophen  **OR** acetaminophen , HYDROmorphone  (DILAUDID ) injection, methocarbamol , ondansetron  **OR** ondansetron  (ZOFRAN ) IV, oxyCODONE , temazepam   Antibiotics: Anti-infectives (From admission, onward)    Start     Dose/Rate Route Frequency Ordered Stop   07/13/24 1600  cefTRIAXone  (ROCEPHIN ) 1 g in sodium chloride  0.9 % 100 mL IVPB        1 g 200 mL/hr over 30 Minutes Intravenous Every 24 hours 07/12/24 2055     07/12/24 1615  cefTRIAXone  (ROCEPHIN ) 1 g in sodium chloride  0.9 % 100 mL IVPB        1 g 200 mL/hr over 30 Minutes Intravenous  Once 07/12/24 1602 07/12/24 1713   07/12/24 1615  metroNIDAZOLE  (FLAGYL ) IVPB 500 mg        500 mg 100 mL/hr over 60 Minutes Intravenous  Once 07/12/24 1602 07/12/24 1844       Objective:  Vital Signs  Vitals:   07/14/24 1314 07/14/24 1928 07/15/24 0455 07/15/24 0919  BP: (!) 170/84 112/85 (!) 119/91 134/61  Pulse: (!) 101 (!) 101 (!) 105   Resp: 18 16 18    Temp: 98.1 F (36.7 C) 98.4 F (36.9 C) 98.4 F (36.9 C)   TempSrc:      SpO2: 95% 94% 93% (!) 87%  Weight:      Height:        General appearance: Awake alert.  In no distress Resp: Clear to auscultation bilaterally.  Normal effort Cardio: S1-S2 is normal regular.  No S3-S4.  No rubs murmurs or bruit GI: Abdomen is soft.  Tender in the lower abdomen.  No rebound rigidity or guarding.  No masses organomegaly. No focal neurological deficits.  Lab Results:  Data Reviewed: I have personally reviewed following labs and reports of  the imaging studies  CBC: Recent Labs  Lab 07/12/24 1125 07/13/24 0522 07/14/24 0529 07/15/24 0456  WBC 20.7* 19.0* 18.1* 14.2*  NEUTROABS 17.6*  --   --   --   HGB 11.8* 11.2* 11.1* 9.7*  HCT 34.6* 34.4* 35.3* 30.2*  MCV 87.2 91.7 91.2 92.4  PLT 469* 511* 550* 519*    Basic Metabolic Panel: Recent Labs  Lab 07/12/24 1125 07/13/24 0522 07/14/24 0529 07/15/24 0456  NA 136 140 134* 132*  K 3.1* 4.3 3.6 3.8  CL 97* 104 98 101  CO2 21* 21* 21* 18*  GLUCOSE 114* 99 93 93  BUN 17 13 10 9   CREATININE 0.70 0.58 0.64 0.58  CALCIUM  9.5 8.8* 8.7* 7.8*  MG 2.0  --   --  2.1    GFR: Estimated Creatinine Clearance: 47.8 mL/min (by C-G formula based on SCr of 0.58 mg/dL).  Liver Function Tests: Recent Labs  Lab 07/12/24 1125  AST 26  ALT 18  ALKPHOS 113  BILITOT 1.0  PROT 7.8  ALBUMIN 3.8    Recent Labs  Lab 07/12/24 1125  LIPASE 24    Recent Results (from the past 240 hours)  Urine Culture     Status: None   Collection Time: 07/12/24  2:48 PM   Specimen: Urine, Catheterized  Result Value Ref Range Status   Specimen Description   Final    URINE, CATHETERIZED Performed at [facility], [address], [place], [place] [address]    Special Requests   Final    NONE Performed at [facility], [address], [place], [place] [address]    Culture   Final    NO GROWTH Performed at [facility], [address], [place], [place] [address]    Report Status 07/13/2024 FINAL  Final  Resp panel by RT-PCR (RSV, Flu A&B, Covid) Anterior Nasal Swab     Status: None   Collection Time: 07/12/24  3:53 PM   Specimen: Anterior Nasal Swab  Result Value Ref Range Status   SARS Coronavirus 2 by RT PCR NEGATIVE NEGATIVE Final    Comment: (NOTE) SARS-CoV-2 target nucleic acids are NOT DETECTED.  The SARS-CoV-2 RNA is generally detectable in upper respiratory specimens during the acute phase of infection. The  lowest concentration of SARS-CoV-2 viral copies this assay can detect is 138 copies/mL. A negative result does not preclude SARS-Cov-2 infection and should not be used as the sole basis for treatment or other patient management decisions. A negative result may occur with  improper specimen collection/handling, submission of specimen other than nasopharyngeal swab, presence of viral mutation(s) within the areas targeted by this assay, and inadequate number of viral copies(<138 copies/mL). A negative result must be combined with clinical observations, patient history, and epidemiological information. The expected result is Negative.  Fact Sheet for Patients:  BloggerCourse.com  Fact Sheet for Healthcare Providers:  SeriousBroker.it  This test is no t yet approved or cleared by the [place]  FDA and  has been authorized for detection and/or diagnosis of SARS-CoV-2 by FDA under an Emergency Use Authorization (EUA). This EUA will remain  in effect (meaning this test can be used) for the duration of the COVID-19 declaration under Section 564(b)(1) of the Act, 21 U.S.C.section 360bbb-3(b)(1), unless the authorization is terminated  or revoked sooner.       Influenza A by PCR NEGATIVE NEGATIVE Final   Influenza B by PCR NEGATIVE NEGATIVE Final    Comment: (NOTE) The Xpert Xpress SARS-CoV-2/FLU/RSV plus assay is intended as an aid in the diagnosis of influenza from Nasopharyngeal swab specimens and should not be used as a sole basis for treatment. Nasal washings and aspirates are unacceptable for Xpert Xpress SARS-CoV-2/FLU/RSV testing.  Fact Sheet for Patients: BloggerCourse.com  Fact Sheet for Healthcare Providers: SeriousBroker.it  This test is not yet approved or cleared by the [place]  FDA and has been authorized for detection and/or diagnosis of SARS-CoV-2 by FDA under  an Emergency Use Authorization (EUA). This EUA will remain in effect (meaning this test can be used) for the duration of the COVID-19 declaration under Section 564(b)(1) of the Act, 21 U.S.C. section 360bbb-3(b)(1), unless the authorization is terminated or revoked.     Resp Syncytial Virus by PCR NEGATIVE NEGATIVE Final    Comment: (NOTE) Fact Sheet for Patients: BloggerCourse.com  Fact Sheet for Healthcare Providers: SeriousBroker.it  This test  is not yet approved or cleared by the [place]  FDA and has been authorized for detection and/or diagnosis of SARS-CoV-2 by FDA under an Emergency Use Authorization (EUA). This EUA will remain in effect (meaning this test can be used) for the duration of the COVID-19 declaration under Section 564(b)(1) of the Act, 21 U.S.C. section 360bbb-3(b)(1), unless the authorization is terminated or revoked.  Performed at [facility], [address], [place], [place] [address]   Blood culture (routine x 2)     Status: None (Preliminary result)   Collection Time: 07/12/24  4:30 PM   Specimen: BLOOD  Result Value Ref Range Status   Specimen Description   Final    BLOOD LEFT ANTECUBITAL Performed at [facility], [address] [place], [place] [address]    Special Requests   Final    BOTTLES DRAWN AEROBIC AND ANAEROBIC Blood Culture results may not be optimal due to an inadequate volume of blood received in culture bottles Performed at [facility], [address] [place], [place] [address]    Culture   Final    NO GROWTH 3 DAYS Performed at [facility], [address] [place], [place] [address]    Report Status PENDING  Incomplete  Blood culture (routine x 2)     Status: None (Preliminary result)   Collection Time: 07/12/24  4:34 PM   Specimen: BLOOD  Result Value Ref Range Status   Specimen Description   Final     BLOOD LEFT ANTECUBITAL Performed at [facility], [address] [place], [place] [address]    Special Requests   Final    BOTTLES DRAWN AEROBIC AND ANAEROBIC Blood Culture results may not be optimal due to an inadequate volume of blood received in culture bottles Performed at [facility], [address], [place], [place] [address]    Culture   Final    NO GROWTH 3 DAYS Performed at [facility], [address], [place], [place] [address]    Report Status PENDING  Incomplete      Radiology Studies: DG Foot 2 Views Left Result Date: 07/14/2024 CLINICAL DATA:  Left dorsal foot pain.  No known injury. EXAM: LEFT FOOT - 2 VIEW COMPARISON:  07/05/2021 FINDINGS: Stable hallux valgus and bunionette at the 5th MTP joint. Mild dorsal soft tissue swelling proximally. Mild dorsal tarsal/metatarsal spur formation with mild progression. Minimal inferior calcaneal enthesophyte. No fracture. IMPRESSION: 1. Mild dorsal soft tissue swelling proximally. 2. Mild dorsal tarsal/metatarsal dorsal spur formation with mild progression. 3. Stable hallux valgus and bunionette at the 5th MTP joint. Electronically Signed   By: Elspeth Bathe M.D.   On: 07/14/2024 11:24       LOS: 0 days   Patricia Peck  [facility] Pager on [email or web address]  07/15/2024, 9:52 AM

## 2024-07-16 DIAGNOSIS — R1030 Lower abdominal pain, unspecified: Secondary | ICD-10-CM

## 2024-07-16 DIAGNOSIS — E538 Deficiency of other specified B group vitamins: Secondary | ICD-10-CM

## 2024-07-16 DIAGNOSIS — I482 Chronic atrial fibrillation, unspecified: Secondary | ICD-10-CM | POA: Diagnosis not present

## 2024-07-16 DIAGNOSIS — K219 Gastro-esophageal reflux disease without esophagitis: Secondary | ICD-10-CM

## 2024-07-16 DIAGNOSIS — K5900 Constipation, unspecified: Secondary | ICD-10-CM | POA: Diagnosis not present

## 2024-07-16 DIAGNOSIS — E876 Hypokalemia: Secondary | ICD-10-CM | POA: Diagnosis not present

## 2024-07-16 DIAGNOSIS — M6289 Other specified disorders of muscle: Secondary | ICD-10-CM | POA: Diagnosis not present

## 2024-07-16 DIAGNOSIS — D649 Anemia, unspecified: Secondary | ICD-10-CM | POA: Diagnosis not present

## 2024-07-16 DIAGNOSIS — K5909 Other constipation: Secondary | ICD-10-CM | POA: Diagnosis not present

## 2024-07-16 DIAGNOSIS — R1013 Epigastric pain: Secondary | ICD-10-CM | POA: Diagnosis not present

## 2024-07-16 DIAGNOSIS — D72829 Elevated white blood cell count, unspecified: Secondary | ICD-10-CM | POA: Diagnosis not present

## 2024-07-16 LAB — CBC
HCT: 34.2 % — ABNORMAL LOW (ref 36.0–46.0)
Hemoglobin: 11 g/dL — ABNORMAL LOW (ref 12.0–15.0)
MCH: 29 pg (ref 26.0–34.0)
MCHC: 32.2 g/dL (ref 30.0–36.0)
MCV: 90.2 fL (ref 80.0–100.0)
Platelets: 647 K/uL — ABNORMAL HIGH (ref 150–400)
RBC: 3.79 MIL/uL — ABNORMAL LOW (ref 3.87–5.11)
RDW: 13.3 % (ref 11.5–15.5)
WBC: 17 K/uL — ABNORMAL HIGH (ref 4.0–10.5)
nRBC: 0 % (ref 0.0–0.2)

## 2024-07-16 LAB — IRON AND TIBC
Iron: 11 ug/dL — ABNORMAL LOW (ref 28–170)
Saturation Ratios: 6 % — ABNORMAL LOW (ref 10.4–31.8)
TIBC: 185 ug/dL — ABNORMAL LOW (ref 250–450)
UIBC: 174 ug/dL

## 2024-07-16 LAB — FOLATE: Folate: 3.6 ng/mL — ABNORMAL LOW (ref >5.9)

## 2024-07-16 LAB — BASIC METABOLIC PANEL WITH GFR
Anion gap: 15 (ref 5–15)
BUN: 7 mg/dL — ABNORMAL LOW (ref 8–23)
CO2: 20 mmol/L — ABNORMAL LOW (ref 22–32)
Calcium: 8.1 mg/dL — ABNORMAL LOW (ref 8.9–10.3)
Chloride: 100 mmol/L (ref 98–111)
Creatinine, Ser: 0.45 mg/dL (ref 0.44–1.00)
GFR, Estimated: >60 mL/min (ref >60)
Glucose, Bld: 92 mg/dL (ref 70–99)
Potassium: 3.3 mmol/L — ABNORMAL LOW (ref 3.5–5.1)
Sodium: 135 mmol/L (ref 135–145)

## 2024-07-16 LAB — RETICULOCYTES
Immature Retic Fract: 19.9 % — ABNORMAL HIGH (ref 2.3–15.9)
RBC.: 3.68 MIL/uL — ABNORMAL LOW (ref 3.87–5.11)
Retic Count, Absolute: 28.3 K/uL (ref 19.0–186.0)
Retic Ct Pct: 0.8 % (ref 0.4–3.1)

## 2024-07-16 LAB — MAGNESIUM: Magnesium: 2.1 mg/dL (ref 1.7–2.4)

## 2024-07-16 LAB — VITAMIN B12: Vitamin B-12: 1621 pg/mL — ABNORMAL HIGH (ref 180–914)

## 2024-07-16 LAB — FERRITIN: Ferritin: 379 ng/mL — ABNORMAL HIGH (ref 11–307)

## 2024-07-16 MED ORDER — SENNOSIDES-DOCUSATE SODIUM 8.6-50 MG PO TABS
2.0000 | ORAL_TABLET | Freq: Every day | ORAL | Status: DC
  Administered 2024-07-17: 2 via ORAL
  Filled 2024-07-16: qty 2

## 2024-07-16 MED ORDER — SODIUM CHLORIDE 0.9 % IV SOLN: INTRAVENOUS | Status: AC

## 2024-07-16 MED ORDER — GLYCERIN (LAXATIVE) 2.1 G RE SUPP
1.0000 | Freq: Every day | RECTAL | Status: DC
  Filled 2024-07-16 (×2): qty 2

## 2024-07-16 MED ORDER — POLYETHYLENE GLYCOL 3350 17 G PO PACK
17.0000 g | PACK | Freq: Two times a day (BID) | ORAL | Status: DC
  Administered 2024-07-17 – 2024-07-18 (×3): 17 g via ORAL
  Filled 2024-07-16 (×3): qty 1

## 2024-07-16 MED ORDER — FOLIC ACID 1 MG PO TABS
1.0000 mg | ORAL_TABLET | Freq: Every day | ORAL | Status: DC
  Administered 2024-07-16 – 2024-07-18 (×3): 1 mg via ORAL
  Filled 2024-07-16 (×3): qty 1

## 2024-07-16 MED ORDER — POTASSIUM CHLORIDE CRYS ER 20 MEQ PO TBCR
40.0000 meq | EXTENDED_RELEASE_TABLET | Freq: Three times a day (TID) | ORAL | Status: AC
  Administered 2024-07-16 (×2): 40 meq via ORAL
  Filled 2024-07-16 (×2): qty 2

## 2024-07-16 MED ORDER — INFLUENZA VAC SPLIT HIGH-DOSE 0.5 ML IM SUSY
0.5000 mL | PREFILLED_SYRINGE | INTRAMUSCULAR | Status: AC
  Administered 2024-07-18: 0.5 mL via INTRAMUSCULAR
  Filled 2024-07-16 (×2): qty 0.5

## 2024-07-16 NOTE — Plan of Care (Signed)
  Problem: Education: Goal: Knowledge of General Education information will improve Description: Including pain rating scale, medication(s)/side effects and non-pharmacologic comfort measures Outcome: Progressing   Problem: Health Behavior/Discharge Planning: Goal: Ability to manage health-related needs will improve Outcome: Progressing   Problem: Clinical Measurements: Goal: Ability to maintain clinical measurements within normal limits will improve Outcome: Progressing Goal: Diagnostic test results will improve Outcome: Progressing Goal: Respiratory complications will improve Outcome: Progressing Goal: Cardiovascular complication will be avoided Outcome: Progressing   Problem: Activity: Goal: Risk for activity intolerance will decrease Outcome: Progressing   Problem: Coping: Goal: Level of anxiety will decrease Outcome: Progressing   Problem: Elimination: Goal: Will not experience complications related to bowel motility Outcome: Progressing Goal: Will not experience complications related to urinary retention Outcome: Progressing   Problem: Safety: Goal: Ability to remain free from injury will improve Outcome: Progressing   Problem: Skin Integrity: Goal: Risk for impaired skin integrity will decrease Outcome: Progressing   Problem: Clinical Measurements: Goal: Will remain free from infection Outcome: Not Progressing   Problem: Nutrition: Goal: Adequate nutrition will be maintained Outcome: Not Progressing   Problem: Pain Managment: Goal: General experience of comfort will improve and/or be controlled Outcome: Not Progressing

## 2024-07-16 NOTE — Progress Notes (Signed)
 Progress Note   Assessment    78 year old female with chronic constipation, likely pelvic floor dysfunction, GERD with cervical esophageal web, distal Schatzki's ring and small hiatal hernia admitted with new normocytic anemia, UTI and acute on chronic lower abdominal and back pain with constipation  Principal Problem:   Intractable pain Active Problems:   Hypothyroidism   Essential hypertension   PAF (paroxysmal atrial fibrillation) (HCC)   History of TIA (transient ischemic attack)   SIRS (systemic inflammatory response syndrome) (HCC)   Hypokalemia   Recommendations   1.  Chronic constipation/pelvic floor dysfunction --she has been using glycerin  suppositories for rectal stimulation and also intermittent MiraLAX  and senna.  We discussed the importance of maintaining a regimen after discharge.  She had a great response to the smog enema and pain has improved --Increase MiraLAX  to 17 g twice daily -- Continue with senna once daily -- Resume glycerin  suppository in the morning for rectal stimulation  2.  Normocytic anemia --she is folate deficient and her iron studies seem most consistent with anemia of chronic disease.  Her ferritin is pending. -- Await ferritin -- Supplement folate -- B12 is normal -- No evidence of GI blood loss to this point  3.  GERD with hiatal hernia --improved with current treatment -- Continue pantoprazole  40 mg twice daily AC  4.  UTI --suggested more by imaging -- Planning 5-day course of ceftriaxone  with improving white count  5.  Paroxysmal atrial fibrillation on Xarelto  --Xarelto  on hold for now but at present no plan for immediate endoscopy or colonoscopy.  Chief Complaint   Patient feels better after smog enema yesterday 2 bowel movements overnight Son at bedside She has not had regular bowel movements for decades and typically uses 2 glycerin  suppositories in the morning MiraLAX  and an herbal tea containing senna Appetite is  improving and she is going to eat breakfast today No nausea No abdominal pain currently Thus far today she has had a dose of MiraLAX  and senna  Vital signs in last 24 hours: Temp:  [97.9 F (36.6 C)-98 F (36.7 C)] 98 F (36.7 C) (09/13 0500) Pulse Rate:  [103-110] 103 (09/13 0837) Resp:  [20] 20 (09/12 1953) BP: (127-173)/(87-98) 127/87 (09/13 0837) SpO2:  [95 %-97 %] 97 % (09/13 0500) Last BM Date : 07/15/24 Gen: awake, alert, NAD HEENT: anicteric  Abd: soft, NT/ND, +BS throughout Ext: no c/c/e Neuro: nonfocal  09/12 0701 - 09/13 0700 In: 793.7 [I.V.:493.6; IV Piggyback:300.1] Out: 450 [Urine:450] Intake/Output this shift: Total I/O In: 243 [P.O.:240; I.V.:3] Out: -   Lab Results: Recent Labs    07/14/24 0529 07/15/24 0456 07/16/24 0747  WBC 18.1* 14.2* 17.0*  HGB 11.1* 9.7* 11.0*  HCT 35.3* 30.2* 34.2*  PLT 550* 519* 647*   BMET Recent Labs    07/14/24 0529 07/15/24 0456 07/16/24 0747  NA 134* 132* 135  K 3.6 3.8 3.3*  CL 98 101 100  CO2 21* 18* 20*  GLUCOSE 93 93 92  BUN 10 9 7*  CREATININE 0.64 0.58 0.45  CALCIUM  8.7* 7.8* 8.1*    Studies/Results: DG Abd Portable 1V Result Date: 07/15/2024 CLINICAL DATA:  [id number] Abdominal pain [id number] EXAM: PORTABLE ABDOMEN - 1 VIEW COMPARISON:  07/12/2024 FINDINGS: Nonobstructive bowel gas pattern.Hyperdense contrast throughout the colon extending to the rectum.No pneumoperitoneum. No organomegaly or radiopaque calculi. Multilevel thoracic osteophytosis. IMPRESSION: Nonobstructive bowel gas pattern. Electronically Signed   By: Rogelia Myers M.D.   On: 07/15/2024 11:35  LOS: 0 days   Gordy CHRISTELLA Starch, MD 07/16/2024, 11:09 AM See [facility], [facility], to contact our on call provider

## 2024-07-16 NOTE — Progress Notes (Signed)
 [facility] PROGRESS NOTE   Patricia Peck FMW:[id number] DOB: 1946-08-01 DOA: 07/12/2024  PCP: Katheen Roselie Rockford, NP  Brief History: 78 y.o. female with medical history significant for hypertension, hyperlipidemia, history of TIA, PAF on Xarelto , GERD, chronic abdominal pain, and chronic back pain who presents with worsening in her abdominal and back pain. Patient reports that she has been experiencing generalized abdominal pain and back pain for many years.  Pain has been worsening recently, was better when she was taking a muscle relaxant, but she ran out of the muscle relaxer and pain has been uncontrolled.  Pain is in the same location and of the same character as her chronic pain, severity has increased.  She follows with [facility] and is being considered for esophageal dilation.  She denies any dysphagia at this time. CT of the abdomen and pelvis is notable for subtle urothelial enhancement in the left renal pelvis, hiatal hernia, and minimal stool.    Consultants: Gastroenterology  Procedures: None    Subjective/Interval History: Patient appears to be comfortable this morning.  She mentions that she is feeling better after she had bowel movements yesterday.  Denies any abdominal pain this morning.  Slept well overnight.  She is not interested in going to nursing facility for rehab.  Prefers to get home health.  Assessment/Plan:  Acute on chronic abdominal and back pain Etiology unclear.  LFTs and lipase levels were normal.  WBC was noted to be elevated.  She had normal lactic acid levels.  UA did not suggest infection however CT scan raised concern for urothelial enhancement in the left renal pelvis.  No other abnormalities noted. Recently seen by gastroenterology in August.  This was for heartburn.  No recent upper endoscopy noted.  Upper GI series was ordered at that time of her outpatient visit.  This was done on September 2 which showed small type I hiatal hernia with  mild gastroesophageal reflux.  Cervical esophageal web noted.  Probable widely patent distal esophageal mucosal ring was noted the barium tablet passed through this region without delay. Patient has also been constipated which could be another reason for her abdominal discomfort.  She was started on laxatives.  She was subsequently given smog enema on 9/12 with which she had bowel movement with improvement in her abdominal pain. She was also empirically started on antibiotics for presumed UTI. Gastroenterology was consulted due to significant pain yesterday.  They ordered the smog enema with which patient has had relief. Continue PPI. Will see how she does with her meals today.  Concern for UTI WBC was 19.0.  Improved to 14.2.  UA was unremarkable however CT scan suggested UTI.  She is on ceftriaxone .  Cultures are without any growth so far.  Will give a 5-day course of ceftriaxone .  Left foot pain No erythema noted on examination.  Mild swelling is noted.  Good pedal pulses.  X-ray showed mild soft tissue swelling.  No other acute findings noted.  Spurs were noted which could be the reason for her pain.  Uric acid level is normal at 2.8.  Right forearm pain At the site of her previous IV access.  Some warmth is noted.  Ice pack can be used.  No erythema seen.  No significant swelling.  SIRS Elevated WBC heart rate and respiratory rate was noted in the ED. She is being treated for presumed UTI as discussed above.  Paroxysmal atrial fibrillation Noted to be on metoprolol  and Xarelto .  Xarelto  was  placed on hold as it is possible that she may need to undergo GI procedure.  But it does not look like GI is planning any procedures.  Hopefully Xarelto  can be resumed soon. Did have some tachyarrhythmia yesterday but heart rate seems to be better controlled this morning.  Essential hypertension Noted to be on amlodipine  and metoprolol .  Blood pressure is reasonably  well-controlled.  Hypothyroidism Continue with levothyroxine .  Hypokalemia/hyponatremia Supplement as needed.  Monitor sodium levels closely. Labs are pending today.  Normocytic anemia/thrombocytosis Drop in hemoglobin noted yesterday.  Repeat labs are pending.  Anemia panel is pending. No overt bleeding has been noted.  DVT Prophylaxis: Xarelto  currently on hold.  SCDs Code Status: Full code Family Communication: Discussed with patient Disposition Plan: Continue IV antibiotics.  Home health when ready for discharge.  Patient declines SNF.      Medications: Scheduled:  amLODipine   5 mg Oral QPM   atorvastatin   80 mg Oral Daily   levothyroxine   50 mcg Oral QAC breakfast   loratadine   10 mg Oral Daily   metoprolol  succinate  50 mg Oral Daily   pantoprazole   40 mg Oral BID AC   polyethylene glycol  17 g Oral Daily   senna-docusate  2 tablet Oral BID   sodium chloride  flush  3 mL Intravenous Q12H   Continuous:  sodium chloride  50 mL/hr at 07/16/24 0656   cefTRIAXone  (ROCEPHIN )  IV 1 g (07/15/24 1546)   PRN:acetaminophen  **OR** acetaminophen , HYDROmorphone  (DILAUDID ) injection, methocarbamol , ondansetron  **OR** ondansetron  (ZOFRAN ) IV, oxyCODONE , temazepam   Antibiotics: Anti-infectives (From admission, onward)    Start     Dose/Rate Route Frequency Ordered Stop   07/13/24 1600  cefTRIAXone  (ROCEPHIN ) 1 g in sodium chloride  0.9 % 100 mL IVPB        1 g 200 mL/hr over 30 Minutes Intravenous Every 24 hours 07/12/24 2055     07/12/24 1615  cefTRIAXone  (ROCEPHIN ) 1 g in sodium chloride  0.9 % 100 mL IVPB        1 g 200 mL/hr over 30 Minutes Intravenous  Once 07/12/24 1602 07/12/24 1713   07/12/24 1615  metroNIDAZOLE  (FLAGYL ) IVPB 500 mg        500 mg 100 mL/hr over 60 Minutes Intravenous  Once 07/12/24 1602 07/12/24 1844       Objective:  Vital Signs  Vitals:   07/15/24 0919 07/15/24 1953 07/16/24 0500 07/16/24 0837  BP: 134/61 (!) 173/98 127/87 127/87  Pulse:  (!)  110 (!) 103 (!) 103  Resp:  20    Temp:  97.9 F (36.6 C) 98 F (36.7 C)   TempSrc:  Oral Oral   SpO2: (!) 87% 95% 97%   Weight:      Height:        General appearance: Awake alert.  In no distress Resp: Clear to auscultation bilaterally.  Normal effort Cardio: S1-S2 is normal regular.  No S3-S4.  No rubs murmurs or bruit GI: Abdomen is soft.  Nontender nondistended.  Bowel sounds are present normal.  No masses organomegaly Extremities: Mild warmth noted in the right forearm area near the wrist medially.  No erythema noted.  Good radial pulses.  No significant swelling.   Lab Results:  Data Reviewed: I have personally reviewed following labs and reports of the imaging studies  CBC: Recent Labs  Lab 07/12/24 1125 07/13/24 0522 07/14/24 0529 07/15/24 0456  WBC 20.7* 19.0* 18.1* 14.2*  NEUTROABS 17.6*  --   --   --  HGB 11.8* 11.2* 11.1* 9.7*  HCT 34.6* 34.4* 35.3* 30.2*  MCV 87.2 91.7 91.2 92.4  PLT 469* 511* 550* 519*    Basic Metabolic Panel: Recent Labs  Lab 07/12/24 1125 07/13/24 0522 07/14/24 0529 07/15/24 0456  NA 136 140 134* 132*  K 3.1* 4.3 3.6 3.8  CL 97* 104 98 101  CO2 21* 21* 21* 18*  GLUCOSE 114* 99 93 93  BUN 17 13 10 9   CREATININE 0.70 0.58 0.64 0.58  CALCIUM  9.5 8.8* 8.7* 7.8*  MG 2.0  --   --  2.1    GFR: Estimated Creatinine Clearance: 47.8 mL/min (by C-G formula based on SCr of 0.58 mg/dL).  Liver Function Tests: Recent Labs  Lab 07/12/24 1125  AST 26  ALT 18  ALKPHOS 113  BILITOT 1.0  PROT 7.8  ALBUMIN 3.8    Recent Labs  Lab 07/12/24 1125  LIPASE 24    Recent Results (from the past 240 hours)  Urine Culture     Status: None   Collection Time: 07/12/24  2:48 PM   Specimen: Urine, Catheterized  Result Value Ref Range Status   Specimen Description   Final    URINE, CATHETERIZED Performed at [facility], [address] [place], [place] [address]    Special Requests   Final    NONE Performed  at [facility], [address] [place], [place] [address]    Culture   Final    NO GROWTH Performed at [facility], [address] [place], [place] [address]    Report Status 07/13/2024 FINAL  Final  Resp panel by RT-PCR (RSV, Flu A&B, Covid) Anterior Nasal Swab     Status: None   Collection Time: 07/12/24  3:53 PM   Specimen: Anterior Nasal Swab  Result Value Ref Range Status   SARS Coronavirus 2 by RT PCR NEGATIVE NEGATIVE Final    Comment: (NOTE) SARS-CoV-2 target nucleic acids are NOT DETECTED.  The SARS-CoV-2 RNA is generally detectable in upper respiratory specimens during the acute phase of infection. The lowest concentration of SARS-CoV-2 viral copies this assay can detect is 138 copies/mL. A negative result does not preclude SARS-Cov-2 infection and should not be used as the sole basis for treatment or other patient management decisions. A negative result may occur with  improper specimen collection/handling, submission of specimen other than nasopharyngeal swab, presence of viral mutation(s) within the areas targeted by this assay, and inadequate number of viral copies(<138 copies/mL). A negative result must be combined with clinical observations, patient history, and epidemiological information. The expected result is Negative.  Fact Sheet for Patients:  BloggerCourse.com  Fact Sheet for Healthcare Providers:  SeriousBroker.it  This test is no t yet approved or cleared by the [place]  FDA and  has been authorized for detection and/or diagnosis of SARS-CoV-2 by FDA under an Emergency Use Authorization (EUA). This EUA will remain  in effect (meaning this test can be used) for the duration of the COVID-19 declaration under Section 564(b)(1) of the Act, 21 U.S.C.section 360bbb-3(b)(1), unless the authorization is terminated  or revoked sooner.       Influenza A by PCR NEGATIVE  NEGATIVE Final   Influenza B by PCR NEGATIVE NEGATIVE Final    Comment: (NOTE) The Xpert Xpress SARS-CoV-2/FLU/RSV plus assay is intended as an aid in the diagnosis of influenza from Nasopharyngeal swab specimens and should not be used as a sole basis for treatment. Nasal washings and aspirates are unacceptable  for Xpert Xpress SARS-CoV-2/FLU/RSV testing.  Fact Sheet for Patients: BloggerCourse.com  Fact Sheet for Healthcare Providers: SeriousBroker.it  This test is not yet approved or cleared by the [place]  FDA and has been authorized for detection and/or diagnosis of SARS-CoV-2 by FDA under an Emergency Use Authorization (EUA). This EUA will remain in effect (meaning this test can be used) for the duration of the COVID-19 declaration under Section 564(b)(1) of the Act, 21 U.S.C. section 360bbb-3(b)(1), unless the authorization is terminated or revoked.     Resp Syncytial Virus by PCR NEGATIVE NEGATIVE Final    Comment: (NOTE) Fact Sheet for Patients: BloggerCourse.com  Fact Sheet for Healthcare Providers: SeriousBroker.it  This test is not yet approved or cleared by the [place]  FDA and has been authorized for detection and/or diagnosis of SARS-CoV-2 by FDA under an Emergency Use Authorization (EUA). This EUA will remain in effect (meaning this test can be used) for the duration of the COVID-19 declaration under Section 564(b)(1) of the Act, 21 U.S.C. section 360bbb-3(b)(1), unless the authorization is terminated or revoked.  Performed at [facility], [address] [place], [place] [address]   Blood culture (routine x 2)     Status: None (Preliminary result)   Collection Time: 07/12/24  4:30 PM   Specimen: BLOOD  Result Value Ref Range Status   Specimen Description   Final    BLOOD LEFT ANTECUBITAL Performed at [facility], [address] [place], [place] [address]    Special Requests   Final    BOTTLES DRAWN AEROBIC AND ANAEROBIC Blood Culture results may not be optimal due to an inadequate volume of blood received in culture bottles Performed at [facility], [address], [place], [place] [address]    Culture   Final    NO GROWTH 4 DAYS Performed at [facility], [address] [place], [place] [address]    Report Status PENDING  Incomplete  Blood culture (routine x 2)     Status: None (Preliminary result)   Collection Time: 07/12/24  4:34 PM   Specimen: BLOOD  Result Value Ref Range Status   Specimen Description   Final    BLOOD LEFT ANTECUBITAL Performed at [facility], [address] [place], [place] [address]    Special Requests   Final    BOTTLES DRAWN AEROBIC AND ANAEROBIC Blood Culture results may not be optimal due to an inadequate volume of blood received in culture bottles Performed at [facility], [address], [place], [place] [address]    Culture   Final    NO GROWTH 4 DAYS Performed at [facility], [address], [place], [place] [address]    Report Status PENDING  Incomplete      Radiology Studies: DG Abd Portable 1V Result Date: 07/15/2024 CLINICAL DATA:  [id number] Abdominal pain [id number] EXAM: PORTABLE ABDOMEN - 1 VIEW COMPARISON:  07/12/2024 FINDINGS: Nonobstructive bowel gas pattern.Hyperdense contrast throughout the colon extending to the rectum.No pneumoperitoneum. No organomegaly or radiopaque calculi. Multilevel thoracic osteophytosis. IMPRESSION: Nonobstructive bowel gas pattern. Electronically Signed   By: Rogelia Myers M.D.   On: 07/15/2024 11:35   DG Foot 2 Views Left Result Date: 07/14/2024 CLINICAL DATA:  Left dorsal foot pain.  No known injury. EXAM: LEFT FOOT - 2 VIEW COMPARISON:  07/05/2021 FINDINGS: Stable hallux valgus and bunionette at the 5th MTP joint. Mild dorsal  soft tissue swelling proximally. Mild dorsal tarsal/metatarsal spur formation with mild  progression. Minimal inferior calcaneal enthesophyte. No fracture. IMPRESSION: 1. Mild dorsal soft tissue swelling proximally. 2. Mild dorsal tarsal/metatarsal dorsal spur formation with mild progression. 3. Stable hallux valgus and bunionette at the 5th MTP joint. Electronically Signed   By: Elspeth Bathe M.D.   On: 07/14/2024 11:24       LOS: 0 days   Zachary Nole Verdene  [facility] Pager on [email or web address]  07/16/2024, 8:59 AM

## 2024-07-16 NOTE — Plan of Care (Signed)

## 2024-07-17 DIAGNOSIS — M6289 Other specified disorders of muscle: Secondary | ICD-10-CM | POA: Diagnosis not present

## 2024-07-17 DIAGNOSIS — K219 Gastro-esophageal reflux disease without esophagitis: Secondary | ICD-10-CM | POA: Diagnosis not present

## 2024-07-17 DIAGNOSIS — I482 Chronic atrial fibrillation, unspecified: Secondary | ICD-10-CM | POA: Diagnosis not present

## 2024-07-17 DIAGNOSIS — D649 Anemia, unspecified: Secondary | ICD-10-CM | POA: Diagnosis not present

## 2024-07-17 DIAGNOSIS — K5909 Other constipation: Secondary | ICD-10-CM | POA: Diagnosis not present

## 2024-07-17 DIAGNOSIS — E876 Hypokalemia: Secondary | ICD-10-CM | POA: Diagnosis not present

## 2024-07-17 DIAGNOSIS — D638 Anemia in other chronic diseases classified elsewhere: Secondary | ICD-10-CM

## 2024-07-17 DIAGNOSIS — R1013 Epigastric pain: Secondary | ICD-10-CM | POA: Diagnosis not present

## 2024-07-17 DIAGNOSIS — D72829 Elevated white blood cell count, unspecified: Secondary | ICD-10-CM | POA: Diagnosis not present

## 2024-07-17 DIAGNOSIS — K5900 Constipation, unspecified: Secondary | ICD-10-CM | POA: Diagnosis not present

## 2024-07-17 DIAGNOSIS — R1030 Lower abdominal pain, unspecified: Secondary | ICD-10-CM | POA: Diagnosis not present

## 2024-07-17 LAB — BASIC METABOLIC PANEL WITH GFR
Anion gap: 11 (ref 5–15)
BUN: 7 mg/dL — ABNORMAL LOW (ref 8–23)
CO2: 19 mmol/L — ABNORMAL LOW (ref 22–32)
Calcium: 7.8 mg/dL — ABNORMAL LOW (ref 8.9–10.3)
Chloride: 107 mmol/L (ref 98–111)
Creatinine, Ser: 0.57 mg/dL (ref 0.44–1.00)
GFR, Estimated: >60 mL/min (ref >60)
Glucose, Bld: 100 mg/dL — ABNORMAL HIGH (ref 70–99)
Potassium: 4 mmol/L (ref 3.5–5.1)
Sodium: 138 mmol/L (ref 135–145)

## 2024-07-17 LAB — CULTURE, BLOOD (ROUTINE X 2)
Culture: NO GROWTH
Culture: NO GROWTH

## 2024-07-17 MED ORDER — INFLUENZA VIRUS VACC SPLIT PF (FLUZONE) 0.5 ML IM SUSY
0.5000 mL | PREFILLED_SYRINGE | INTRAMUSCULAR | Status: DC
Start: 2024-07-18 — End: 2024-07-18

## 2024-07-17 MED ORDER — METOPROLOL SUCCINATE ER 50 MG PO TB24
75.0000 mg | ORAL_TABLET | Freq: Every day | ORAL | Status: DC
Start: 2024-07-18 — End: 2024-07-18
  Administered 2024-07-18: 75 mg via ORAL
  Filled 2024-07-17: qty 1

## 2024-07-17 MED ORDER — METOPROLOL SUCCINATE ER 25 MG PO TB24
25.0000 mg | ORAL_TABLET | Freq: Once | ORAL | Status: AC
  Administered 2024-07-17: 25 mg via ORAL
  Filled 2024-07-17: qty 1

## 2024-07-17 MED ORDER — DOXYCYCLINE HYCLATE 100 MG PO TABS
100.0000 mg | ORAL_TABLET | Freq: Two times a day (BID) | ORAL | Status: DC
  Administered 2024-07-17 – 2024-07-18 (×3): 100 mg via ORAL
  Filled 2024-07-17 (×3): qty 1

## 2024-07-17 NOTE — Plan of Care (Signed)

## 2024-07-17 NOTE — Progress Notes (Signed)
 [facility] PROGRESS NOTE   ALONA DANFORD FMW:[id number] DOB: [birth year] DOA: 07/12/2024  PCP: Katheen Roselie Rockford, NP  Brief History: 78 y.o. female with medical history significant for hypertension, hyperlipidemia, history of TIA, PAF on Xarelto , GERD, chronic abdominal pain, and chronic back pain who presents with worsening in her abdominal and back pain. Patient reports that she has been experiencing generalized abdominal pain and back pain for many years.  Pain has been worsening recently, was better when she was taking a muscle relaxant, but she ran out of the muscle relaxer and pain has been uncontrolled.  Pain is in the same location and of the same character as her chronic pain, severity has increased.  She follows with [facility] and is being considered for esophageal dilation.  She denies any dysphagia at this time. CT of the abdomen and pelvis is notable for subtle urothelial enhancement in the left renal pelvis, hiatal hernia, and minimal stool.    Consultants: Gastroenterology  Procedures: None    Subjective/Interval History: Patient feeling better.  Abdominal pain has improved.  Had more bowel movements yesterday.  Denies any chest pain or shortness of breath.  The right arm still has a little bit of swelling in the forearm near the wrist.  Still has some discomfort in that area where she had an IV.    Assessment/Plan:  Acute on chronic abdominal and back pain Etiology unclear.  LFTs and lipase levels were normal.  WBC was noted to be elevated.  She had normal lactic acid levels.  UA did not suggest infection however CT scan raised concern for urothelial enhancement in the left renal pelvis.  No other abnormalities noted. Recently seen by gastroenterology in August for heartburn.  No recent upper endoscopy noted.  Upper GI series was ordered at that time of her outpatient visit.  This was done on September 2 which showed small type I hiatal hernia with mild  gastroesophageal reflux.  Cervical esophageal web noted.  Probable widely patent distal esophageal mucosal ring was noted the barium tablet passed through this region without delay. Patient has also been constipated which could be another reason for her abdominal discomfort.  She was started on laxatives.  She was subsequently given smog enema on 9/12 with which she had bowel movement with improvement in her abdominal pain.  She is currently on MiraLAX  twice a day and Senokot twice a day.  Has had more bowel movements yesterday.  Abdominal pain has improved. GI continues to follow.  Paroxysmal atrial fibrillation with RVR Noted to be on metoprolol  and Xarelto .  Xarelto  was placed on hold as it is possible that she may need to undergo GI procedure.  But it does not look like GI is planning any procedures.  Hopefully Xarelto  can be resumed soon. Heart rate is poorly controlled this morning.  It has been somewhat poorly controlled over the last 24 hours.  She is on metoprolol  50 mg once a day.  Will increase to 75 mg once a day.  Continue to monitor on telemetry.  Concern for UTI WBC was 19.0.  Improved to 14.2.  UA was unremarkable however CT scan suggested UTI.  She is on ceftriaxone .  Cultures are without any growth so far.  She was given 5-day course of ceftriaxone .  Left foot pain No erythema noted on examination.  Mild swelling is noted.  Good pedal pulses.  X-ray showed mild soft tissue swelling.  No other acute findings noted.  Spurs were noted  which could be the reason for her pain.  Uric acid level is normal at 2.8.  Right forearm pain At the site of her previous IV access.  Some warmth is noted.  No erythema seen but there is some swelling.  Will give her 5-day course of doxycycline .  Ice pack can be used.   SIRS Elevated WBC heart rate and respiratory rate was noted in the ED. She is being treated for presumed UTI as discussed above.  Essential hypertension Noted to be on amlodipine   and metoprolol .  Blood pressure is reasonably well-controlled.  Hypothyroidism Continue with levothyroxine .  Hypokalemia/hyponatremia Potassium level and sodium levels have improved.  Magnesium  was 2.1.  Normocytic anemia/thrombocytosis Globin is stable.  Anemia panel shows ferritin of 379, iron of 11, TIBC 185, percent saturation 6.  Folic acid  3.6.  Vitamin B12 1621.  Leukocytosis Persistent leukocytosis noted.  She is afebrile.  Etiology unclear.  Outpatient management.  DVT Prophylaxis: Xarelto  currently on hold.  SCDs Code Status: Full code Family Communication: Discussed with patient Disposition Plan: Continue IV antibiotics.  Home health when ready for discharge.  Patient declines SNF.      Medications: Scheduled:  amLODipine   5 mg Oral QPM   atorvastatin   80 mg Oral Daily   doxycycline   100 mg Oral Q12H   folic acid   1 mg Oral Daily   Glycerin  (Adult)  1-2 suppository Rectal QAC breakfast   Influenza vac split trivalent PF  0.5 mL Intramuscular Tomorrow-1000   levothyroxine   50 mcg Oral QAC breakfast   loratadine   10 mg Oral Daily   metoprolol  succinate  25 mg Oral Once   [START ON 07/18/2024] metoprolol  succinate  75 mg Oral Daily   pantoprazole   40 mg Oral BID AC   polyethylene glycol  17 g Oral BID   senna-docusate  2 tablet Oral QHS   sodium chloride  flush  3 mL Intravenous Q12H   Continuous:  sodium chloride  75 mL/hr at 07/16/24 1809   cefTRIAXone  (ROCEPHIN )  IV Stopped (07/16/24 1800)   PRN:acetaminophen  **OR** acetaminophen , HYDROmorphone  (DILAUDID ) injection, methocarbamol , ondansetron  **OR** ondansetron  (ZOFRAN ) IV, oxyCODONE , temazepam   Antibiotics: Anti-infectives (From admission, onward)    Start     Dose/Rate Route Frequency Ordered Stop   07/17/24 1000  doxycycline  (VIBRA -TABS) tablet 100 mg        100 mg Oral Every 12 hours 07/17/24 0837 07/22/24 0959   07/13/24 1600  cefTRIAXone  (ROCEPHIN ) 1 g in sodium chloride  0.9 % 100 mL IVPB        1  g 200 mL/hr over 30 Minutes Intravenous Every 24 hours 07/12/24 2055 07/18/24 1559   07/12/24 1615  cefTRIAXone  (ROCEPHIN ) 1 g in sodium chloride  0.9 % 100 mL IVPB        1 g 200 mL/hr over 30 Minutes Intravenous  Once 07/12/24 1602 07/12/24 1713   07/12/24 1615  metroNIDAZOLE  (FLAGYL ) IVPB 500 mg        500 mg 100 mL/hr over 60 Minutes Intravenous  Once 07/12/24 1602 07/12/24 1844       Objective:  Vital Signs  Vitals:   07/16/24 1526 07/16/24 1637 07/16/24 1944 07/17/24 0447  BP: (!) 146/94 (!) 146/94 130/88 126/81  Pulse: 97  83 (!) 110  Resp:   18 18  Temp: 97.6 F (36.4 C)  98.1 F (36.7 C) 97.7 F (36.5 C)  TempSrc: Oral  Oral Oral  SpO2: 99%  96% 98%  Weight:      Height:  General appearance: Awake alert.  In no distress Resp: Clear to auscultation bilaterally.  Normal effort Cardio: S1-S2 is irregularly irregular, tachycardic.  No S3-S4. GI: Abdomen is soft.  Nontender nondistended.  Bowel sounds are present normal.  No masses organomegaly Extremities: Warmth to touch of right forearm.  No erythema.  Some swelling is noted. No focal neurological deficits.   Lab Results:  Data Reviewed: I have personally reviewed following labs and reports of the imaging studies  CBC: Recent Labs  Lab 07/12/24 1125 07/13/24 0522 07/14/24 0529 07/15/24 0456 07/16/24 0747  WBC 20.7* 19.0* 18.1* 14.2* 17.0*  NEUTROABS 17.6*  --   --   --   --   HGB 11.8* 11.2* 11.1* 9.7* 11.0*  HCT 34.6* 34.4* 35.3* 30.2* 34.2*  MCV 87.2 91.7 91.2 92.4 90.2  PLT 469* 511* 550* 519* 647*    Basic Metabolic Panel: Recent Labs  Lab 07/12/24 1125 07/13/24 0522 07/14/24 0529 07/15/24 0456 07/16/24 0747 07/17/24 0743  NA 136 140 134* 132* 135 138  K 3.1* 4.3 3.6 3.8 3.3* 4.0  CL 97* 104 98 101 100 107  CO2 21* 21* 21* 18* 20* 19*  GLUCOSE 114* 99 93 93 92 100*  BUN 17 13 10 9  7* 7*  CREATININE 0.70 0.58 0.64 0.58 0.45 0.57  CALCIUM  9.5 8.8* 8.7* 7.8* 8.1* 7.8*  MG 2.0   --   --  2.1 2.1  --     GFR: Estimated Creatinine Clearance: 47.8 mL/min (by C-G formula based on SCr of 0.57 mg/dL).  Liver Function Tests: Recent Labs  Lab 07/12/24 1125  AST 26  ALT 18  ALKPHOS 113  BILITOT 1.0  PROT 7.8  ALBUMIN 3.8    Recent Labs  Lab 07/12/24 1125  LIPASE 24    Recent Results (from the past 240 hours)  Urine Culture     Status: None   Collection Time: 07/12/24  2:48 PM   Specimen: Urine, Catheterized  Result Value Ref Range Status   Specimen Description   Final    URINE, CATHETERIZED Performed at [facility], [address], [place], [place] [address]    Special Requests   Final    NONE Performed at [facility], [address], [place], [place] [address]    Culture   Final    NO GROWTH Performed at [facility], [address] [place], [place] [address]    Report Status 07/13/2024 FINAL  Final  Resp panel by RT-PCR (RSV, Flu A&B, Covid) Anterior Nasal Swab     Status: None   Collection Time: 07/12/24  3:53 PM   Specimen: Anterior Nasal Swab  Result Value Ref Range Status   SARS Coronavirus 2 by RT PCR NEGATIVE NEGATIVE Final    Comment: (NOTE) SARS-CoV-2 target nucleic acids are NOT DETECTED.  The SARS-CoV-2 RNA is generally detectable in upper respiratory specimens during the acute phase of infection. The lowest concentration of SARS-CoV-2 viral copies this assay can detect is 138 copies/mL. A negative result does not preclude SARS-Cov-2 infection and should not be used as the sole basis for treatment or other patient management decisions. A negative result may occur with  improper specimen collection/handling, submission of specimen other than nasopharyngeal swab, presence of viral mutation(s) within the areas targeted by this assay, and inadequate number of viral copies(<138 copies/mL). A negative result must be combined with clinical observations, patient history, and  epidemiological information. The expected result is Negative.  Fact Sheet for  Patients:  BloggerCourse.com  Fact Sheet for Healthcare Providers:  SeriousBroker.it  This test is no t yet approved or cleared by the [place]  FDA and  has been authorized for detection and/or diagnosis of SARS-CoV-2 by FDA under an Emergency Use Authorization (EUA). This EUA will remain  in effect (meaning this test can be used) for the duration of the COVID-19 declaration under Section 564(b)(1) of the Act, 21 U.S.C.section 360bbb-3(b)(1), unless the authorization is terminated  or revoked sooner.       Influenza A by PCR NEGATIVE NEGATIVE Final   Influenza B by PCR NEGATIVE NEGATIVE Final    Comment: (NOTE) The Xpert Xpress SARS-CoV-2/FLU/RSV plus assay is intended as an aid in the diagnosis of influenza from Nasopharyngeal swab specimens and should not be used as a sole basis for treatment. Nasal washings and aspirates are unacceptable for Xpert Xpress SARS-CoV-2/FLU/RSV testing.  Fact Sheet for Patients: BloggerCourse.com  Fact Sheet for Healthcare Providers: SeriousBroker.it  This test is not yet approved or cleared by the [place]  FDA and has been authorized for detection and/or diagnosis of SARS-CoV-2 by FDA under an Emergency Use Authorization (EUA). This EUA will remain in effect (meaning this test can be used) for the duration of the COVID-19 declaration under Section 564(b)(1) of the Act, 21 U.S.C. section 360bbb-3(b)(1), unless the authorization is terminated or revoked.     Resp Syncytial Virus by PCR NEGATIVE NEGATIVE Final    Comment: (NOTE) Fact Sheet for Patients: BloggerCourse.com  Fact Sheet for Healthcare Providers: SeriousBroker.it  This test is not yet approved or cleared by the [place]  FDA and has been  authorized for detection and/or diagnosis of SARS-CoV-2 by FDA under an Emergency Use Authorization (EUA). This EUA will remain in effect (meaning this test can be used) for the duration of the COVID-19 declaration under Section 564(b)(1) of the Act, 21 U.S.C. section 360bbb-3(b)(1), unless the authorization is terminated or revoked.  Performed at [facility], [address] [place], [place] [address]   Blood culture (routine x 2)     Status: None   Collection Time: 07/12/24  4:30 PM   Specimen: BLOOD  Result Value Ref Range Status   Specimen Description   Final    BLOOD LEFT ANTECUBITAL Performed at [facility], [address] [place], [place] [address]    Special Requests   Final    BOTTLES DRAWN AEROBIC AND ANAEROBIC Blood Culture results may not be optimal due to an inadequate volume of blood received in culture bottles Performed at [facility], [address], [place], [place] [address]    Culture   Final    NO GROWTH 5 DAYS Performed at [facility], [address], [place], [place] [address]    Report Status 07/17/2024 FINAL  Final  Blood culture (routine x 2)     Status: None   Collection Time: 07/12/24  4:34 PM   Specimen: BLOOD  Result Value Ref Range Status   Specimen Description   Final    BLOOD LEFT ANTECUBITAL Performed at [facility], [address], [place], [place] [address]    Special Requests   Final    BOTTLES DRAWN AEROBIC AND ANAEROBIC Blood Culture results may not be optimal due to an inadequate volume of blood received in culture bottles Performed at [facility], [address], [place], [place] [address]    Culture   Final    NO GROWTH 5 DAYS Performed at [facility], [address], [place], [place] [address]    Report Status 07/17/2024 FINAL  Final      Radiology Studies: No results found.      LOS: 0 days   Andyn Sales  [employer] on [email or web address]  07/17/2024, 9:04 AM

## 2024-07-17 NOTE — Plan of Care (Signed)
  Problem: Education: Goal: Knowledge of General Education information will improve Description: Including pain rating scale, medication(s)/side effects and non-pharmacologic comfort measures Outcome: Progressing   Problem: Health Behavior/Discharge Planning: Goal: Ability to manage health-related needs will improve Outcome: Progressing   Problem: Clinical Measurements: Goal: Ability to maintain clinical measurements within normal limits will improve Outcome: Progressing Goal: Will remain free from infection Outcome: Progressing Goal: Diagnostic test results will improve Outcome: Progressing Goal: Respiratory complications will improve Outcome: Progressing Goal: Cardiovascular complication will be avoided Outcome: Progressing   Problem: Nutrition: Goal: Adequate nutrition will be maintained Outcome: Progressing   Problem: Elimination: Goal: Will not experience complications related to bowel motility Outcome: Progressing Goal: Will not experience complications related to urinary retention Outcome: Progressing   Problem: Pain Managment: Goal: General experience of comfort will improve and/or be controlled Outcome: Progressing   Problem: Safety: Goal: Ability to remain free from injury will improve Outcome: Progressing   Problem: Skin Integrity: Goal: Risk for impaired skin integrity will decrease Outcome: Progressing   Problem: Activity: Goal: Risk for activity intolerance will decrease Outcome: Not Progressing   Problem: Coping: Goal: Level of anxiety will decrease Outcome: Not Progressing

## 2024-07-17 NOTE — Progress Notes (Signed)
    Progress Note   Assessment    78 year old female with chronic constipation, likely pelvic floor dysfunction, GERD with cervical esophageal web, distal Schatzki's ring and small hiatal hernia admitted with new normocytic anemia, UTI and acute on chronic lower abdominal and back pain with constipation   Principal Problem:   Intractable pain Active Problems:   Hypothyroidism   Essential hypertension   PAF (paroxysmal atrial fibrillation) (HCC)   History of TIA (transient ischemic attack)   SIRS (systemic inflammatory response syndrome) (HCC)   Hypokalemia   Chronic constipation   Pelvic floor dysfunction   Normocytic anemia   Folate deficiency   Recommendations   1.  Chronic constipation/pelvic floor dysfunction --excellent response to senna plus twice daily MiraLAX .  Pain resolving -- Continue MiraLAX  twice daily -- I have moved senna to bedtime which hopefully will avoid urgent daytime bowel movements -- Glycerin  suppository can be used in the morning as needed  2.  Normocytic anemia --folate deficient, iron studies consistent with anemia of chronic disease and not that of iron deficiency -- Supplement folate daily -- B12 normal, not consistent with IDA  3.  GERD with hiatal hernia --controlled now with twice daily PPI -- Continue pantoprazole  40 mg twice daily AC  4.  UTI --they are treating for 5 days with antibiotics based on cystitis by imaging and leukocytosis  GI will sign off but arrange outpatient follow-up, call with questions   35 minutes total spent today including patient facing time, coordination of care, reviewing medical history/procedures/pertinent radiology studies, and documentation of the encounter.   Chief Complaint   Patient had 2 large bowel movements yesterday Able to eat breakfast this morning, walk to the bathroom and then to the chair in the room with minimal assistance using a walker No abdominal or pelvic pain or back pain this  morning Patient asking about discharge planning as far as whether she will need a short rehab stay versus going home Would like to see PT OT again who saw her initially while she was feeling worse Patient's son at bedside  Vital signs in last 24 hours: Temp:  [97.6 F (36.4 C)-98.1 F (36.7 C)] 97.7 F (36.5 C) (09/14 0447) Pulse Rate:  [83-110] 110 (09/14 0447) Resp:  [17-18] 18 (09/14 0447) BP: (126-146)/(81-94) 126/81 (09/14 0447) SpO2:  [96 %-99 %] 98 % (09/14 0447) Last BM Date : 07/16/24 Gen: awake, alert, NAD HEENT: anicteric  Abd: soft, NT/ND, +BS throughout Ext: no c/c/e Neuro: nonfocal   Intake/Output from previous day: 09/13 0701 - 09/14 0700 In: 1674.6 [P.O.:960; I.V.:554.6; IV Piggyback:160] Out: 750 [Urine:750] Intake/Output this shift: Total I/O In: 3 [I.V.:3] Out: -   Lab Results: Recent Labs    07/15/24 0456 07/16/24 0747  WBC 14.2* 17.0*  HGB 9.7* 11.0*  HCT 30.2* 34.2*  PLT 519* 647*   BMET Recent Labs    07/15/24 0456 07/16/24 0747 07/17/24 0743  NA 132* 135 138  K 3.8 3.3* 4.0  CL 101 100 107  CO2 18* 20* 19*  GLUCOSE 93 92 100*  BUN 9 7* 7*  CREATININE 0.58 0.45 0.57  CALCIUM  7.8* 8.1* 7.8*    Studies/Results: No results found.    LOS: 0 days   Gordy CHRISTELLA Starch, MD 07/17/2024, 10:01 AM See [facility], [facility], to contact our on call provider

## 2024-07-18 DIAGNOSIS — E876 Hypokalemia: Secondary | ICD-10-CM | POA: Diagnosis not present

## 2024-07-18 LAB — BASIC METABOLIC PANEL WITH GFR
Anion gap: 14 (ref 5–15)
BUN: 7 mg/dL — ABNORMAL LOW (ref 8–23)
CO2: 20 mmol/L — ABNORMAL LOW (ref 22–32)
Calcium: 8.2 mg/dL — ABNORMAL LOW (ref 8.9–10.3)
Chloride: 109 mmol/L (ref 98–111)
Creatinine, Ser: 0.52 mg/dL (ref 0.44–1.00)
GFR, Estimated: >60 mL/min (ref >60)
Glucose, Bld: 87 mg/dL (ref 70–99)
Potassium: 3.2 mmol/L — ABNORMAL LOW (ref 3.5–5.1)
Sodium: 142 mmol/L (ref 135–145)

## 2024-07-18 MED ORDER — DOXYCYCLINE HYCLATE 100 MG PO TABS: 100.0000 mg | ORAL_TABLET | Freq: Two times a day (BID) | ORAL | 0 refills | Status: AC

## 2024-07-18 MED ORDER — POTASSIUM CHLORIDE CRYS ER 20 MEQ PO TBCR: 40.0000 meq | EXTENDED_RELEASE_TABLET | Freq: Every day | ORAL | 0 refills | Status: DC

## 2024-07-18 MED ORDER — POTASSIUM CHLORIDE CRYS ER 20 MEQ PO TBCR
40.0000 meq | EXTENDED_RELEASE_TABLET | ORAL | Status: AC
  Administered 2024-07-18 (×2): 40 meq via ORAL
  Filled 2024-07-18 (×2): qty 2

## 2024-07-18 MED ORDER — FOLIC ACID 1 MG PO TABS: 1.0000 mg | ORAL_TABLET | Freq: Every day | ORAL | 2 refills | Status: DC

## 2024-07-18 MED ORDER — POLYETHYLENE GLYCOL 3350 17 GM/SCOOP PO POWD: 17.0000 g | Freq: Every day | ORAL | 0 refills | Status: DC

## 2024-07-18 MED ORDER — METHOCARBAMOL 500 MG PO TABS: 500.0000 mg | ORAL_TABLET | Freq: Three times a day (TID) | ORAL | 0 refills | Status: AC | PRN

## 2024-07-18 MED ORDER — SENNOSIDES-DOCUSATE SODIUM 8.6-50 MG PO TABS: 2.0000 | ORAL_TABLET | Freq: Every day | ORAL | 2 refills | Status: AC

## 2024-07-18 MED ORDER — METOPROLOL SUCCINATE ER 25 MG PO TB24: 75.0000 mg | ORAL_TABLET | Freq: Every day | ORAL | 1 refills | Status: AC

## 2024-07-18 NOTE — Plan of Care (Signed)

## 2024-07-18 NOTE — Progress Notes (Signed)
 Physical Therapy Treatment Patient Details Name: Patricia Peck MRN: [id number] DOB: 12/09/1945 Today's Date: 07/18/2024   History of Present Illness 78 yo female presents to therapy following hospital admission secondary to abdominal and back pain. Pt reports chronic pain and recently running out of mm relaxer and endorses constipation. Pt was found to have abn labs, A-fib and HTN. Pt imaging  and suspect UTI.  Pt dx with SIRS, leukocytosis and epigastric and acute on chronic back pain. Pt PMH includes but is not limited to: PAF, HTN, TIA, hypothyroidism, arthritis, depression, GERD, HLD, and OP.    PT Comments   Pt admitted with above diagnosis.  Pt currently with functional limitations due to the deficits listed below (see PT Problem List).  Pt in bed when PT arrived. Son and another visitor present. Pt agreeable to therapy intervention. Pt states that the MD indicated the L LE and R UE pain is attributed to arthritis. Pt states the pain is much better. Pt required increased time, min cues S and use of hospital bed for supine to sit, CGA and cues for safety and AD placement with transfer tasks, progressed with gait training tasks today from min A for obstacle navigation to CGA in hallway up to 100 feet with RW and cues. Pt left seated in recliner, all needs in place, son present. PT able to change d/c recommendation to [facility] with significant improvement in pt presentation including functional mobility tasks, pain reports and cognition. Pt will benefit from acute skilled PT to increase their independence and safety with mobility to allow discharge.      If plan is discharge home, recommend the following: A little help with walking and/or transfers;A little help with bathing/dressing/bathroom;Assistance with cooking/housework;Direct supervision/assist for medications management;Assist for transportation;Help with stairs or ramp for entrance;Supervision due to cognitive status   Can travel by private  vehicle     Yes  Equipment Recommendations  Wheelchair (measurements PT) (20 inch standard WC with elevating and swing away leg rests and foam cushion)    Recommendations for Other Services       Precautions / Restrictions Precautions Precautions: Fall Recall of Precautions/Restrictions: Intact Restrictions Weight Bearing Restrictions Per Provider Order: No     Mobility  Bed Mobility Overal bed mobility: Needs Assistance Bed Mobility: Supine to Sit     Supine to sit: HOB elevated, Used rails, Supervision     General bed mobility comments: pt reports decreased R UE and L LE pain today. pt is able to follow cues and participate with therapy. min cues with use of hospital bed for supine to sit    Transfers Overall transfer level: Needs assistance Equipment used: Rolling walker (2 wheels) Transfers: Sit to/from Stand, Bed to chair/wheelchair/BSC Sit to Stand: Contact guard assist           General transfer comment: min cues for safety, RW and UE placement for bed, recliner and commode transfers    Ambulation/Gait Ambulation/Gait assistance: Contact guard assist, Min assist Gait Distance (Feet): 100 Feet Assistive device: Rolling walker (2 wheels) Gait Pattern/deviations: Step-to pattern, Decreased stance time - left Gait velocity: decreased     General Gait Details: pt required occational assist for RW management with obstacle navigation and able to progress to CGA and min cues for gait tasks in personal room and in hallway with min cues for posture, proper distance from RW and AD management no apparent pain response with L LE WB and pt noted to have decreased grasp on RW with  R UE   Stairs             Wheelchair Mobility     Tilt Bed    Modified Rankin (Stroke Patients Only)       Balance Overall balance assessment: Needs assistance Sitting-balance support: No upper extremity supported, Feet supported Sitting balance-Leahy Scale: Good        Standing balance-Leahy Scale: Fair Standing balance comment: pt able to maintain static standing balance no UE support at sink                            Communication Communication Communication: No apparent difficulties Factors Affecting Communication:  (word finding)  Cognition Arousal: Alert Behavior During Therapy: WFL for tasks assessed/performed   PT - Cognitive impairments: History of cognitive impairments, Memory, Attention, Safety/Judgement                         Following commands: Impaired Following commands impaired: Follows multi-step commands inconsistently    Cueing Cueing Techniques: Verbal cues, Visual cues, Gestural cues, Tactile cues  Exercises      General Comments        Pertinent Vitals/Pain Pain Assessment Pain Assessment: Faces Faces Pain Scale: Hurts a little bit Pain Location: R UE and L LE Pain Descriptors / Indicators: Dull, Aching Pain Intervention(s): Limited activity within patient's tolerance, Monitored during session, Repositioned    Home Living                          Prior Function            PT Goals (current goals can now be found in the care plan section) Acute Rehab PT Goals Patient Stated Goal: to be able to go home to [name] (dog) PT Goal Formulation: With patient Time For Goal Achievement: 07/27/24 Potential to Achieve Goals: Good Progress towards PT goals: Progressing toward goals    Frequency    Min 3X/week      PT Plan      Co-evaluation              AM-PAC PT 6 Clicks Mobility   Outcome Measure  Help needed turning from your back to your side while in a flat bed without using bedrails?: None Help needed moving from lying on your back to sitting on the side of a flat bed without using bedrails?: A Little Help needed moving to and from a bed to a chair (including a wheelchair)?: A Little Help needed standing up from a chair using your arms (e.g., wheelchair or  bedside chair)?: A Little Help needed to walk in hospital room?: A Little Help needed climbing 3-5 steps with a railing? : A Lot 6 Click Score: 18    End of Session Equipment Utilized During Treatment: Gait belt Activity Tolerance: Patient tolerated treatment well;No increased pain Patient left: in chair;with chair alarm set;with family/visitor present Nurse Communication: Mobility status PT Visit Diagnosis: Unsteadiness on feet (R26.81);Other abnormalities of gait and mobility (R26.89);Muscle weakness (generalized) (M62.81);Difficulty in walking, not elsewhere classified (R26.2);Pain Pain - Right/Left: Left (L LE and R UE) Pain - part of body: Leg;Ankle and joints of foot;Shoulder;Arm     Time: 8988-8965 PT Time Calculation (min) (ACUTE ONLY): 23 min  Charges:    $Gait Training: 8-22 mins $Therapeutic Activity: 8-22 mins PT General Charges $$ ACUTE PT VISIT: 1 Visit  [place], PT Acute Rehab    [place] VEAR Drone 07/18/2024, 11:08 AM

## 2024-07-18 NOTE — Discharge Summary (Signed)
 [facility]  Physician Discharge Summary   Patient ID: Patricia Peck MRN: [id number] DOB/AGE: 1946/07/06 78 y.o.  Admit date: 07/12/2024 Discharge date:   07/18/2024   PCP: Katheen Roselie Rockford, NP  DISCHARGE DIAGNOSES:  Severe constipation Abdominal pain secondary to constipation   Hypothyroidism   Essential hypertension   PAF (paroxysmal atrial fibrillation) (HCC)   History of TIA (transient ischemic attack)   SIRS (systemic inflammatory response syndrome) (HCC)   Hypokalemia   Chronic constipation   Pelvic floor dysfunction   Normocytic anemia   Folate deficiency   RECOMMENDATIONS FOR OUTPATIENT FOLLOW UP: Patient to continue with bowel regimen and avoid constipation as much as possible.   Home Health: PT and OT.  Patient declines SNF for short-term rehab Equipment/Devices: Standard wheelchair  CODE STATUS: Full code  DISCHARGE CONDITION: fair  Diet recommendation: Heart healthy  INITIAL HISTORY: 78 y.o. female with medical history significant for hypertension, hyperlipidemia, history of TIA, PAF on Xarelto , GERD, chronic abdominal pain, and chronic back pain who presents with worsening in her abdominal and back pain. Patient reports that she has been experiencing generalized abdominal pain and back pain for many years.  Pain has been worsening recently, was better when she was taking a muscle relaxant, but she ran out of the muscle relaxer and pain has been uncontrolled.  Pain is in the same location and of the same character as her chronic pain, severity has increased.  She follows with [facility] and is being considered for esophageal dilation.  She denies any dysphagia at this time. CT of the abdomen and pelvis is notable for subtle urothelial enhancement in the left renal pelvis, hiatal hernia, and minimal stool.     Consultants: Gastroenterology   Procedures: None  HOSPITAL COURSE:   Acute on chronic abdominal and back pain Etiology unclear.  LFTs and  lipase levels were normal.  WBC was noted to be elevated.  She had normal lactic acid levels.  UA did not suggest infection however CT scan raised concern for urothelial enhancement in the left renal pelvis.  No other abnormalities noted. Recently seen by gastroenterology in August for heartburn.  No recent upper endoscopy noted.  Upper GI series was ordered at that time of her outpatient visit.  This was done on September 2 which showed small type I hiatal hernia with mild gastroesophageal reflux.  Cervical esophageal web noted.  Probable widely patent distal esophageal mucosal ring was noted the barium tablet passed through this region without delay. Patient has also been constipated which could be another reason for her abdominal discomfort.  She was started on laxatives.  She was subsequently given smog enema on 9/12 with which she had bowel movement with improvement in her abdominal pain.  She is currently on MiraLAX  twice a day and Senokot twice a day. Patient has had several bowel movements with some loose stools yesterday.  Laxative dose has been decreased. Abdominal pain has significantly improved. Patient was seen by GI and they have signed off.  It does appear that her abdominal pain was primarily due to constipation.   Paroxysmal atrial fibrillation with RVR Noted to be on metoprolol  and Xarelto .  Xarelto  was held in case procedure was needed.  Can be resumed. Heart rate has been poorly controlled probably due to pain issues.  Dose of metoprolol  was increased slightly.  Heart rate is much better controlled in the last 24 hours.   Concern for UTI WBC was 19.0.  Improved to 14.2.  UA  was unremarkable however CT scan suggested UTI.  She is on ceftriaxone .  Cultures are without any growth so far.  She was given 5-day course of ceftriaxone .   Left foot pain No erythema noted on examination.  Mild swelling is noted.  Good pedal pulses.  X-ray showed mild soft tissue swelling.  No other acute  findings noted.  Spurs were noted which could be the reason for her pain.  Uric acid level is normal at 2.8.   Right forearm pain At the site of her previous IV access.  Some warmth is noted.  No erythema seen but there is some swelling.  Will give her 5-day course of doxycycline .  Ice pack can be used.    SIRS Elevated WBC heart rate and respiratory rate was noted in the ED. Likely multifactorial  Essential hypertension Noted to be on amlodipine  and metoprolol .  Blood pressure is reasonably well-controlled.   Hypothyroidism Continue with levothyroxine .   Hypokalemia/hyponatremia Supplement potassium prior to discharge.  Will give her a few doses for her to take at home.   Normocytic anemia/thrombocytosis/folic acid  deficiency Anemia panel shows ferritin of 379, iron of 11, TIBC 185, percent saturation 6.  Folic acid  3.6.  Vitamin B12 1621. She has been started on folic acid  supplementation   Leukocytosis Persistent leukocytosis noted.  She is afebrile.  Etiology unclear.  Outpatient management.    Patient is stable.  Hopefully discharge later today if her bowel movements slowed down.   PERTINENT LABS:  The results of significant diagnostics from this hospitalization (including imaging, microbiology, ancillary and laboratory) are listed below for reference.    Microbiology: Recent Results (from the past 240 hours)  Urine Culture     Status: None   Collection Time: 07/12/24  2:48 PM   Specimen: Urine, Catheterized  Result Value Ref Range Status   Specimen Description   Final    URINE, CATHETERIZED Performed at [facility], [address], [place], [place] [address]    Special Requests   Final    NONE Performed at [facility], [address] [place], [place] [address]    Culture   Final    NO GROWTH Performed at [facility], [address], [place], [place] [address]    Report Status 07/13/2024 FINAL  Final  Resp  panel by RT-PCR (RSV, Flu A&B, Covid) Anterior Nasal Swab     Status: None   Collection Time: 07/12/24  3:53 PM   Specimen: Anterior Nasal Swab  Result Value Ref Range Status   SARS Coronavirus 2 by RT PCR NEGATIVE NEGATIVE Final    Comment: (NOTE) SARS-CoV-2 target nucleic acids are NOT DETECTED.  The SARS-CoV-2 RNA is generally detectable in upper respiratory specimens during the acute phase of infection. The lowest concentration of SARS-CoV-2 viral copies this assay can detect is 138 copies/mL. A negative result does not preclude SARS-Cov-2 infection and should not be used as the sole basis for treatment or other patient management decisions. A negative result may occur with  improper specimen collection/handling, submission of specimen other than nasopharyngeal swab, presence of viral mutation(s) within the areas targeted by this assay, and inadequate number of viral copies(<138 copies/mL). A negative result must be combined with clinical observations, patient history, and epidemiological information. The expected result is Negative.  Fact Sheet for Patients:  BloggerCourse.com  Fact Sheet for Healthcare Providers:  SeriousBroker.it  This test is no t yet approved or cleared by the [place]  FDA and  has been authorized for detection and/or diagnosis of SARS-CoV-2 by FDA under an Emergency Use Authorization (EUA). This EUA will remain  in effect (meaning this test can be used) for the duration of the COVID-19 declaration under Section 564(b)(1) of the Act, 21 U.S.C.section 360bbb-3(b)(1), unless the authorization is terminated  or revoked sooner.       Influenza A by PCR NEGATIVE NEGATIVE Final   Influenza B by PCR NEGATIVE NEGATIVE Final    Comment: (NOTE) The Xpert Xpress SARS-CoV-2/FLU/RSV plus assay is intended as an aid in the diagnosis of influenza from Nasopharyngeal swab specimens and should not be used as a  sole basis for treatment. Nasal washings and aspirates are unacceptable for Xpert Xpress SARS-CoV-2/FLU/RSV testing.  Fact Sheet for Patients: BloggerCourse.com  Fact Sheet for Healthcare Providers: SeriousBroker.it  This test is not yet approved or cleared by the [place]  FDA and has been authorized for detection and/or diagnosis of SARS-CoV-2 by FDA under an Emergency Use Authorization (EUA). This EUA will remain in effect (meaning this test can be used) for the duration of the COVID-19 declaration under Section 564(b)(1) of the Act, 21 U.S.C. section 360bbb-3(b)(1), unless the authorization is terminated or revoked.     Resp Syncytial Virus by PCR NEGATIVE NEGATIVE Final    Comment: (NOTE) Fact Sheet for Patients: BloggerCourse.com  Fact Sheet for Healthcare Providers: SeriousBroker.it  This test is not yet approved or cleared by the [place]  FDA and has been authorized for detection and/or diagnosis of SARS-CoV-2 by FDA under an Emergency Use Authorization (EUA). This EUA will remain in effect (meaning this test can be used) for the duration of the COVID-19 declaration under Section 564(b)(1) of the Act, 21 U.S.C. section 360bbb-3(b)(1), unless the authorization is terminated or revoked.  Performed at [facility], [address], [place], [place] [address]   Blood culture (routine x 2)     Status: None   Collection Time: 07/12/24  4:30 PM   Specimen: BLOOD  Result Value Ref Range Status   Specimen Description   Final    BLOOD LEFT ANTECUBITAL Performed at [facility], [address] [place], [place] [address]    Special Requests   Final    BOTTLES DRAWN AEROBIC AND ANAEROBIC Blood Culture results may not be optimal due to an inadequate volume of blood received in culture bottles Performed at [facility], [address], [place], [place] [address]    Culture   Final    NO GROWTH 5 DAYS Performed at [facility], [address], [place], [place] [address]    Report Status 07/17/2024 FINAL  Final  Blood culture (routine x 2)     Status: None   Collection Time: 07/12/24  4:34 PM   Specimen: BLOOD  Result Value Ref Range Status   Specimen Description   Final    BLOOD LEFT ANTECUBITAL Performed at [facility], [address], [place], [place] [address]    Special Requests   Final    BOTTLES DRAWN AEROBIC AND ANAEROBIC Blood Culture results may not be optimal due to an inadequate volume of blood received in culture bottles Performed at [facility], [address] [place], [place] [address]    Culture   Final    NO GROWTH 5 DAYS Performed at [facility], [address] [place], [place] [address]    Report Status 07/17/2024 FINAL  Final     Labs:   Basic  Metabolic Panel: Recent Labs  Lab 07/12/24 1125 07/13/24 0522 07/14/24 0529 07/15/24 0456 07/16/24 0747 07/17/24 0743 07/18/24 0442  NA 136   < > 134* 132* 135 138 142  K 3.1*   < > 3.6 3.8 3.3* 4.0 3.2*  CL 97*   < > 98 101 100 107 109  CO2 21*   < > 21* 18* 20* 19* 20*  GLUCOSE 114*   < > 93 93 92 100* 87  BUN 17   < > 10 9 7* 7* 7*  CREATININE 0.70   < > 0.64 0.58 0.45 0.57 0.52  CALCIUM  9.5   < > 8.7* 7.8* 8.1* 7.8* 8.2*  MG 2.0  --   --  2.1 2.1  --   --    < > = values in this interval not displayed.   Liver Function Tests: Recent Labs  Lab 07/12/24 1125  AST 26  ALT 18  ALKPHOS 113  BILITOT 1.0  PROT 7.8  ALBUMIN 3.8   Recent Labs  Lab 07/12/24 1125  LIPASE 24   CBC: Recent Labs  Lab 07/12/24 1125 07/13/24 0522 07/14/24 0529 07/15/24 0456 07/16/24 0747  WBC 20.7* 19.0* 18.1* 14.2* 17.0*  NEUTROABS 17.6*  --   --   --   --   HGB 11.8* 11.2* 11.1* 9.7* 11.0*  HCT 34.6* 34.4* 35.3* 30.2* 34.2*  MCV 87.2 91.7 91.2 92.4 90.2   PLT 469* 511* 550* 519* 647*    IMAGING STUDIES DG Abd Portable 1V Result Date: 07/15/2024 CLINICAL DATA:  [id number] Abdominal pain [id number] EXAM: PORTABLE ABDOMEN - 1 VIEW COMPARISON:  07/12/2024 FINDINGS: Nonobstructive bowel gas pattern.Hyperdense contrast throughout the colon extending to the rectum.No pneumoperitoneum. No organomegaly or radiopaque calculi. Multilevel thoracic osteophytosis. IMPRESSION: Nonobstructive bowel gas pattern. Electronically Signed   By: Rogelia Myers M.D.   On: 07/15/2024 11:35   DG Foot 2 Views Left Result Date: 07/14/2024 CLINICAL DATA:  Left dorsal foot pain.  No known injury. EXAM: LEFT FOOT - 2 VIEW COMPARISON:  07/05/2021 FINDINGS: Stable hallux valgus and bunionette at the 5th MTP joint. Mild dorsal soft tissue swelling proximally. Mild dorsal tarsal/metatarsal spur formation with mild progression. Minimal inferior calcaneal enthesophyte. No fracture. IMPRESSION: 1. Mild dorsal soft tissue swelling proximally. 2. Mild dorsal tarsal/metatarsal dorsal spur formation with mild progression. 3. Stable hallux valgus and bunionette at the 5th MTP joint. Electronically Signed   By: Elspeth Bathe M.D.   On: 07/14/2024 11:24   DG Chest 2 View Result Date: 07/12/2024 CLINICAL DATA:  abd pain, elevated wbc, eval for PNA. EXAM: CHEST - 2 VIEW COMPARISON:  07/23/2017. FINDINGS: Bilateral lung fields are clear. Bilateral costophrenic angles are clear. Stable cardio-mediastinal silhouette. No acute osseous abnormalities. The soft tissues are within normal limits. IMPRESSION: No active cardiopulmonary disease. Electronically Signed   By: Ree Molt M.D.   On: 07/12/2024 16:19   CT ABDOMEN PELVIS W CONTRAST Result Date: 07/12/2024 CLINICAL DATA:  Abdominal pain, acute, nonlocalized abd pain, constipation EXAM: CT ABDOMEN AND PELVIS WITH CONTRAST TECHNIQUE: Multidetector CT imaging of the abdomen and pelvis was performed using the standard protocol following bolus administration of  intravenous contrast. RADIATION DOSE REDUCTION: This exam was performed according to the departmental dose-optimization program which includes automated exposure control, adjustment of the mA and/or kV according to patient size and/or use of iterative reconstruction technique. CONTRAST:  OMNIPAQUE  IOHEXOL  300 MG/ML  SOLN COMPARISON:  07/05/2024, 03/15/2024 FINDINGS: Lower chest: Trace bilateral pleural effusions with  bibasilar atelectasis. Fibrolinear scarring in the right middle lobe. Hepatobiliary: No mass.Scattered subcentimeter hepatic hypodensities, too small to definitively characterize, but likely small cysts or biliary hematomas.No radiopaque stones or wall thickening of the gallbladder. No intrahepatic or extrahepatic biliary ductal dilation. The portal veins are patent. Pancreas: No mass or main ductal dilation. No peripancreatic inflammation or fluid collection. Spleen: Normal size. No mass. Adrenals/Urinary Tract: No adrenal masses. No renal mass. Subcentimeter hypodensities are noted in the kidneys, too small to definitively characterize, but likely small cysts. Subtle urothelial enhancement noted in the left renal pelvis. No renal stones or hydronephrosis. The urinary bladder is distended without focal abnormality. Stomach/Bowel: Moderate-sized sliding-type hiatal hernia. 3.4 cm periampullary duodenum diverticulum. The stomach is decompressed without focal abnormality. No small bowel wall thickening or inflammation. No small bowel obstruction.The appendix was not visualized. No right lower quadrant or pericecal inflammatory changes to suggest acute appendicitis. Sigmoid colonic diverticulosis. No changes of acute diverticulitis. Dense contrast filling the colon. Minimal stool in the ascending colon. Vascular/Lymphatic: No aortic aneurysm. Diffuse aortoiliac atherosclerosis. No intraabdominal or pelvic lymphadenopathy. Reproductive: Hysterectomy. No concerning adnexal mass.No free pelvic fluid.  Other: No pneumoperitoneum, ascites, or mesenteric inflammation. Musculoskeletal: No acute fracture or destructive lesion. Multilevel degenerative disc disease of the spine. Unchanged, moderate, grade 2 anterolisthesis of L5 on S1 with bilateral L5 pars interarticularis defects. IMPRESSION: 1. Subtle urothelial enhancement noted in the left renal pelvis, worrisome for an ascending urinary tract infection. Correlation with urinalysis recommended for further characterization. No hydronephrosis or nephrolithiasis. 2. Moderate-sized sliding-type hiatal hernia. Periampullary duodenum and colonic diverticulosis. No changes of acute diverticulitis. 3. The colon is full of hyperdense contrast material. Minimal stool noted in the ascending colon. 4. Trace bilateral pleural effusions. Aortic Atherosclerosis (ICD10-I70.0). Electronically Signed   By: Rogelia Myers M.D.   On: 07/12/2024 14:04   DG UGI W DOUBLE CM (HD BA) Result Date: 07/05/2024 CLINICAL DATA:  Patient with a history of GERD, constipation and moderate-sized sliding-type hiatal hernia identified on CT abdomen pelvis. EXAM: UPPER GI SERIES WITH HIGH DENSITY WITHOUT KUB TECHNIQUE: Scout radiograph was obtained. Combined double and single contrast examination was performed using effervescent crystals, high-density barium and thin liquid barium. This exam was performed by Warren Dais, NP, and was supervised and interpreted by Dr. Ramond. FLUOROSCOPY: Radiation Exposure Index (as provided by the fluoroscopic device): 39.6 mGy Kerma COMPARISON:  CT abdomen pelvis with contrast 03/15/2024 FINDINGS: Scout Radiograph: Unremarkable bowel gas pattern. Mild cardiomegaly. Esophagus: Cervical esophageal web anteriorly, image 12 series 3 and image 9 series 15. Suspected distal esophageal mucosal ring on image 33 series 17 although difficult to measure diameter due to obliquity. Esophageal motility: Preserved primary peristaltic waves on 3/4 swallows, within normal  limits. Gastroesophageal reflux: Small volume reflux into the lower esophagus. Ingested 13mm barium tablet: Passed normally Stomach: Small type 1 hiatal hernia Gastric emptying: Normal. Duodenum: Diverticula of the descending and transverse duodenum as on image 1 series 11, without findings of inflammation. Other:  None. IMPRESSION: 1. Small type 1 hiatal hernia. Mild gastroesophageal reflux observed. 2. Cervical esophageal web noted. 3. Probable widely patent distal esophageal mucosal ring. The barium tablet passed through this region without delay. 4. Mild cardiomegaly. Electronically Signed   By: Ryan Ramond M.D.   On: 07/05/2024 12:37    DISCHARGE EXAMINATION: Vitals:   07/18/24 0300 07/18/24 0400 07/18/24 0500 07/18/24 0519  BP:    123/88  Pulse:    99  Resp: 18 18 18 18   Temp:  98.1 F (36.7 C)  TempSrc:    Oral  SpO2:    99%  Weight:      Height:       General appearance: Awake alert.  In no distress Resp: Clear to auscultation bilaterally.  Normal effort Cardio: S1-S2 is clearly irregular GI: Abdomen is soft.  Nontender nondistended.  Bowel sounds are present normal.  No masses organomegaly Extremities: No edema.  Full range of motion of lower extremities. Neurologic: Alert and oriented x3.  No focal neurological deficits.    DISPOSITION: Home with son  Discharge Instructions     Call MD for:  difficulty breathing, headache or visual disturbances   Complete by: As directed    Call MD for:  extreme fatigue   Complete by: As directed    Call MD for:  persistant dizziness or light-headedness   Complete by: As directed    Call MD for:  persistant nausea and vomiting   Complete by: As directed    Call MD for:  severe uncontrolled pain   Complete by: As directed    Call MD for:  temperature >100.4   Complete by: As directed    Diet general   Complete by: As directed    Discharge instructions   Complete by: As directed    Please take your medications as  prescribed.  Please be sure to follow-up with your primary care provider within 1 week.  Avoid constipation by taking laxatives and stool softeners.  You were cared for by a hospitalist during your hospital stay. If you have any questions about your discharge medications or the care you received while you were in the hospital after you are discharged, you can call the unit and asked to speak with the hospitalist on call if the hospitalist that took care of you is not available. Once you are discharged, your primary care physician will handle any further medical issues. Please note that NO REFILLS for any discharge medications will be authorized once you are discharged, as it is imperative that you return to your primary care physician (or establish a relationship with a primary care physician if you do not have one) for your aftercare needs so that they can reassess your need for medications and monitor your lab values. If you do not have a primary care physician, you can call [phone] for a physician referral.   Increase activity slowly   Complete by: As directed          Allergies as of 07/18/2024   No Known Allergies      Medication List     TAKE these medications    amLODipine  5 MG tablet Commonly known as: NORVASC  Take 1 tablet (5 mg total) by mouth every evening.   Anti-Itch Vaginal 20-3 % Crea Apply 1 application  topically 2 (two) times daily as needed (irritation, itching).   atorvastatin  80 MG tablet Commonly known as: LIPITOR Take 1 tablet (80 mg total) by mouth daily.   BEANO PO Take 1 tablet by mouth 2 (two) times daily.   bumetanide  1 MG tablet Commonly known as: BUMEX  Take 1 tablet (1 mg total) by mouth daily as needed. For leg and ankle swelling. Take with potassium chloride  10mEq   CALCIUM  + VITAMIN D3 PO Take 1 tablet by mouth 2 (two) times daily.   CRANBERRY PO Take 2 capsules by mouth daily.   denosumab  60 MG/ML Soln injection Commonly known as:  PROLIA  Inject 60 mg into the skin once. Administer  in upper arm, thigh, or abdomen   doxycycline  100 MG tablet Commonly known as: VIBRA -TABS Take 1 tablet (100 mg total) by mouth every 12 (twelve) hours for 4 days.   estradiol 0.1 MG/GM vaginal cream Commonly known as: ESTRACE Place 1 Applicatorful vaginally 3 (three) times a week.   ezetimibe  10 MG tablet Commonly known as: ZETIA  Take 1 tablet (10 mg total) by mouth daily.   famotidine  40 MG tablet Commonly known as: PEPCID  TAKE 1 TABLET(40 MG) BY MOUTH AT BEDTIME   folic acid  1 MG tablet Commonly known as: FOLVITE  Take 1 tablet (1 mg total) by mouth daily. Start taking on: July 19, 2024   levothyroxine  50 MCG tablet Commonly known as: SYNTHROID  Take 1 tablet (50 mcg total) by mouth daily before breakfast.   loratadine  10 MG tablet Commonly known as: CLARITIN  Take 10 mg by mouth daily.   methocarbamol  500 MG tablet Commonly known as: ROBAXIN  Take 1 tablet (500 mg total) by mouth every 8 (eight) hours as needed for muscle spasms.   metoprolol  succinate 25 MG 24 hr tablet Commonly known as: TOPROL -XL Take 3 tablets (75 mg total) by mouth daily. Take with or immediately following a meal. Start taking on: July 19, 2024 What changed:  medication strength See the new instructions.   montelukast  10 MG tablet Commonly known as: SINGULAIR  TAKE 1 TABLET(10 MG) BY MOUTH AT BEDTIME   nitrofurantoin  100 MG capsule Commonly known as: MACRODANTIN  Take 100 mg by mouth daily.   pantoprazole  40 MG tablet Commonly known as: PROTONIX  Take 1 tablet (40 mg total) by mouth 2 (two) times daily before a meal.   polyethylene glycol powder 17 GM/SCOOP powder Commonly known as: GLYCOLAX /MIRALAX  Take 17 g by mouth daily. What changed:  when to take this reasons to take this   potassium chloride  10 MEQ tablet Commonly known as: KLOR-CON  Take 1 tablet (10 mEq total) by mouth daily as needed. For leg and ankle swelling.  Take with bumex  1mg  What changed:  reasons to take this additional instructions   potassium chloride  SA 20 MEQ tablet Commonly known as: KLOR-CON  M Take 2 tablets (40 mEq total) by mouth daily for 5 days.   senna-docusate 8.6-50 MG tablet Commonly known as: Senokot-S Take 2 tablets by mouth at bedtime.   temazepam  7.5 MG capsule Commonly known as: RESTORIL  TAKE 1 CAPSULE(7.5 MG) BY MOUTH AT BEDTIME   Uro-MP 118 MG Caps Take 2 capsules by mouth 2 (two) times daily.   VITAMIN D -3 PO Take 1 capsule by mouth See admin instructions. Take 1 capsule by mouth on Tuesday, Thursday, Saturday.   VITAMIN E PO Take 1 capsule by mouth See admin instructions. Take 1 capsule by mouth on Tuesday, Thursday and Saturday.   Xarelto  15 MG Tabs tablet Generic drug: Rivaroxaban  TAKE 1 TABLET(15 MG) BY MOUTH DAILY WITH SUPPER          Follow-up Information     Care, [facility] Follow up.   Specialty: Home Health Services Why: Once you are home from the hospital, someone from [facility] will call you to start your Home Health Physical therapy services. Contact information: [address] [place] [address] [phone]         Nche, Roselie Rockford, NP. Schedule an appointment as soon as possible for a visit in 1 week(s).   Specialty: Internal Medicine Contact information: [address] [place] [address] [phone]  TOTAL DISCHARGE TIME: 35 minutes  Sharissa Brierley [employer] on [email or web address]  07/18/2024, 11:00 AM

## 2024-07-19 ENCOUNTER — Telehealth: Payer: Self-pay

## 2024-07-19 NOTE — Transitions of Care (Post Inpatient/ED Visit) (Signed)
 07/19/2024  Name: Patricia Peck MRN: [id number] DOB: Mar 09, 1946  Today's TOC FU Call Status: Today's TOC FU Call Status:: Successful TOC FU Call Completed TOC FU Call Complete Date: 07/19/24 Patient's Name and Date of Birth confirmed.  Transition Care Management Follow-up Telephone Call Date of Discharge: 07/18/24 Discharge Facility: [facility]) Type of Discharge: Inpatient Admission Primary Inpatient Discharge Diagnosis:: UTI How have you been since you were released from the hospital?: Better Any questions or concerns?: No  Items Reviewed: Did you receive and understand the discharge instructions provided?: Yes Medications obtained,verified, and reconciled?: Yes (Medications Reviewed) Any new allergies since your discharge?: No Dietary orders reviewed?: Yes Do you have support at home?: Yes People in Home [RPT]: child(ren), adult  Medications Reviewed Today: Medications Reviewed Today     Reviewed by Emmitt Pan, LPN (Licensed Practical Nurse) on 07/19/24 at 1426  Med List Status: <None>   Medication Order Taking? Sig Documenting Provider Last Dose Status Informant  Alpha-D-Galactosidase (BEANO PO) [id number] Yes Take 1 tablet by mouth 2 (two) times daily. [provider]  Active Self, Pharmacy Records  amLODipine  (NORVASC ) 5 MG tablet [id number] Yes Take 1 tablet (5 mg total) by mouth every evening. Katheen Roselie Rockford, NP  Active Self, Pharmacy Records  atorvastatin  (LIPITOR) 80 MG tablet [id number] Yes Take 1 tablet (80 mg total) by mouth daily. Pietro Redell GORMAN, MD  Active Self, Pharmacy Records  Benzocaine -Resorcinol (ANTI-ITCH VAGINAL) 20-3 % CREA [id number] Yes Apply 1 application  topically 2 (two) times daily as needed (irritation, itching). [provider]  Active Self, Pharmacy Records  bumetanide  (BUMEX ) 1 MG tablet [id number] Yes Take 1 tablet (1 mg total) by mouth daily as needed. For leg and ankle swelling. Take with potassium chloride   10mEq Nche, Roselie Rockford, NP  Active Self, Pharmacy Records  Calcium  Carb-Cholecalciferol (CALCIUM  + VITAMIN D3 PO) [id number] Yes Take 1 tablet by mouth 2 (two) times daily. [provider]  Active Self, Pharmacy Records  Cholecalciferol (VITAMIN D -3 PO) [id number] Yes Take 1 capsule by mouth See admin instructions. Take 1 capsule by mouth on Tuesday, Thursday, Saturday. [provider]  Active Self, Pharmacy Records  CRANBERRY PO [id number] Yes Take 2 capsules by mouth daily. [provider]  Active Self, Pharmacy Records  denosumab  (PROLIA ) 60 MG/ML SOLN injection [id number] Yes Inject 60 mg into the skin once. Administer in upper arm, thigh, or abdomen Jenetta Holmes HERO, MD  Active Self, Pharmacy Records  doxycycline  (VIBRA -TABS) 100 MG tablet [id number] Yes Take 1 tablet (100 mg total) by mouth every 12 (twelve) hours for 4 days. Verdene Purchase, MD  Active   estradiol (ESTRACE) 0.1 MG/GM vaginal cream [id number] Yes Place 1 Applicatorful vaginally 3 (three) times a week. [provider]  Active Self, Pharmacy Records  ezetimibe  (ZETIA ) 10 MG tablet [id number] Yes Take 1 tablet (10 mg total) by mouth daily. Pietro Redell GORMAN, MD  Active Self, Pharmacy Records  famotidine  (PEPCID ) 40 MG tablet [id number] Yes TAKE 1 TABLET(40 MG) BY MOUTH AT BEDTIME Sebastian Beverley NOVAK, MD  Active Self, Pharmacy Records  folic acid  (FOLVITE ) 1 MG tablet [id number] Yes Take 1 tablet (1 mg total) by mouth daily. Krishnan, Gokul, MD  Active   levothyroxine  (SYNTHROID ) 50 MCG tablet [id number] Yes Take 1 tablet (50 mcg total) by mouth daily before breakfast. Nche, Roselie Rockford, NP  Active Self, Pharmacy Records  loratadine  (CLARITIN ) 10 MG tablet [id number] Yes Take 10 mg by mouth daily. [provider]  Active Self,  Pharmacy Records  Meth-Hyo-M Bl-Na Phos-Ph Sal (URO-MP) 118 MG CAPS [id number] Yes Take 2 capsules by mouth 2 (two) times daily. [provider]  Active Self, Pharmacy  Records  methocarbamol  (ROBAXIN ) 500 MG tablet [id number] Yes Take 1 tablet (500 mg total) by mouth every 8 (eight) hours as needed for muscle spasms. Krishnan, Gokul, MD  Active   metoprolol  succinate (TOPROL -XL) 25 MG 24 hr tablet [id number] Yes Take 3 tablets (75 mg total) by mouth daily. Take with or immediately following a meal. Krishnan, Gokul, MD  Active   montelukast  (SINGULAIR ) 10 MG tablet [id number] Yes TAKE 1 TABLET(10 MG) BY MOUTH AT BEDTIME Nche, Roselie Rockford, NP  Active Self, Pharmacy Records  nitrofurantoin  (MACRODANTIN ) 100 MG capsule [id number] Yes Take 100 mg by mouth daily. [provider]  Active Self, Pharmacy Records  pantoprazole  (PROTONIX ) 40 MG tablet [id number] Yes Take 1 tablet (40 mg total) by mouth 2 (two) times daily before a meal. Nche, Roselie Rockford, NP  Active Self, Pharmacy Records  polyethylene glycol powder (GLYCOLAX /MIRALAX ) 17 GM/SCOOP powder [id number] Yes Take 17 g by mouth daily. Krishnan, Gokul, MD  Active   potassium chloride  (KLOR-CON ) 10 MEQ tablet [id number] Yes Take 1 tablet (10 mEq total) by mouth daily as needed. For leg and ankle swelling. Take with bumex  1mg   Patient taking differently: Take 10 mEq by mouth daily as needed (diuretic use (Bumex )).   Nche, Roselie Rockford, NP  Active Self, Pharmacy Records  potassium chloride  SA (KLOR-CON  M) 20 MEQ tablet [id number] Yes Take 2 tablets (40 mEq total) by mouth daily for 5 days. Verdene Purchase, MD  Active   senna-docusate (SENOKOT-S) 8.6-50 MG tablet [id number] Yes Take 2 tablets by mouth at bedtime. Verdene Purchase, MD  Active   temazepam  (RESTORIL ) 7.5 MG capsule [id number] Yes TAKE 1 CAPSULE(7.5 MG) BY MOUTH AT BEDTIME Nche, Roselie Rockford, NP  Active Self, Pharmacy Records  VITAMIN E PO [id number] Yes Take 1 capsule by mouth See admin instructions. Take 1 capsule by mouth on Tuesday, Thursday and Saturday. [provider]  Active Self, Pharmacy Records  XARELTO  15 MG TABS tablet [id number] Yes  TAKE 1 TABLET(15 MG) BY MOUTH DAILY WITH SUPPER Pietro, Redell RAMAN, MD  Active Self, Pharmacy Records            Home Care and Equipment/Supplies: Were Home Health Services Ordered?: Yes Name of Home Health Agency:: [facility] Has Agency set up a time to come to your home?: No Any new equipment or medical supplies ordered?: NA  Functional Questionnaire: Do you need assistance with bathing/showering or dressing?: No Do you need assistance with meal preparation?: No Do you need assistance with eating?: No Do you have difficulty maintaining continence: No Do you need assistance with getting out of bed/getting out of a chair/moving?: No Do you have difficulty managing or taking your medications?: No  Follow up appointments reviewed: PCP Follow-up appointment confirmed?: No (no avail appts, sent message to staff to schedule) Specialist Hospital Follow-up appointment confirmed?: No Reason Specialist Follow-Up Not Confirmed: Patient has Specialist Provider Number and will Call for Appointment Do you need transportation to your follow-up appointment?: No Do you understand care options if your condition(s) worsen?: Yes-patient verbalized understanding    SIGNATURE Julian Lemmings, LPN [facility] Nurse Health Advisor Direct Dial [phone]

## 2024-07-20 DIAGNOSIS — M810 Age-related osteoporosis without current pathological fracture: Secondary | ICD-10-CM | POA: Diagnosis not present

## 2024-07-20 DIAGNOSIS — J45909 Unspecified asthma, uncomplicated: Secondary | ICD-10-CM | POA: Diagnosis not present

## 2024-07-20 DIAGNOSIS — I10 Essential (primary) hypertension: Secondary | ICD-10-CM | POA: Diagnosis not present

## 2024-07-20 DIAGNOSIS — M545 Low back pain, unspecified: Secondary | ICD-10-CM | POA: Diagnosis not present

## 2024-07-20 DIAGNOSIS — D649 Anemia, unspecified: Secondary | ICD-10-CM | POA: Diagnosis not present

## 2024-07-20 DIAGNOSIS — F32A Depression, unspecified: Secondary | ICD-10-CM | POA: Diagnosis not present

## 2024-07-20 DIAGNOSIS — I4892 Unspecified atrial flutter: Secondary | ICD-10-CM | POA: Diagnosis not present

## 2024-07-20 DIAGNOSIS — K219 Gastro-esophageal reflux disease without esophagitis: Secondary | ICD-10-CM | POA: Diagnosis not present

## 2024-07-21 ENCOUNTER — Telehealth: Payer: Self-pay

## 2024-07-21 NOTE — Telephone Encounter (Signed)
 Called and left a voice message detailing that Roselie Mood, NP gave verbal permission for patient to have Physical Therapy Frequency: 1 week 1 2 week 3. Asked to give the office a call back if there are any questions.

## 2024-07-21 NOTE — Telephone Encounter (Signed)
 Copied from CRM [id number]. Topic: Clinical - Home Health Verbal Orders >> Jul 20, 2024  4:33 PM Rea BROCKS wrote: Caller/Agency: [facility] Callback Number: [phone] Service Requested: Physical Therapy Frequency: 1 week 1  2 week 3  Any new concerns about the patient? No

## 2024-07-22 ENCOUNTER — Other Ambulatory Visit: Payer: Self-pay

## 2024-07-22 ENCOUNTER — Other Ambulatory Visit: Payer: Self-pay | Admitting: Nurse Practitioner

## 2024-07-22 DIAGNOSIS — M810 Age-related osteoporosis without current pathological fracture: Secondary | ICD-10-CM

## 2024-07-22 NOTE — Telephone Encounter (Signed)
 Patient is not due for next injection until 08/11/24

## 2024-07-25 ENCOUNTER — Ambulatory Visit (INDEPENDENT_AMBULATORY_CARE_PROVIDER_SITE_OTHER): Admitting: Nurse Practitioner

## 2024-07-25 ENCOUNTER — Other Ambulatory Visit: Payer: Self-pay

## 2024-07-25 ENCOUNTER — Encounter: Payer: Self-pay | Admitting: Nurse Practitioner

## 2024-07-25 VITALS — BP 122/70 | HR 102 | Temp 98.5°F | Ht 64.0 in | Wt 112.6 lb

## 2024-07-25 DIAGNOSIS — K5909 Other constipation: Secondary | ICD-10-CM | POA: Diagnosis not present

## 2024-07-25 DIAGNOSIS — N1832 Chronic kidney disease, stage 3b: Secondary | ICD-10-CM | POA: Diagnosis not present

## 2024-07-25 DIAGNOSIS — D500 Iron deficiency anemia secondary to blood loss (chronic): Secondary | ICD-10-CM | POA: Diagnosis not present

## 2024-07-25 DIAGNOSIS — E876 Hypokalemia: Secondary | ICD-10-CM

## 2024-07-25 DIAGNOSIS — K5901 Slow transit constipation: Secondary | ICD-10-CM

## 2024-07-25 DIAGNOSIS — D638 Anemia in other chronic diseases classified elsewhere: Secondary | ICD-10-CM

## 2024-07-25 DIAGNOSIS — M79672 Pain in left foot: Secondary | ICD-10-CM

## 2024-07-25 DIAGNOSIS — G8929 Other chronic pain: Secondary | ICD-10-CM

## 2024-07-25 LAB — BASIC METABOLIC PANEL WITH GFR
BUN: 10 mg/dL (ref 6–23)
CO2: 26 meq/L (ref 19–32)
Calcium: 9.9 mg/dL (ref 8.4–10.5)
Chloride: 101 meq/L (ref 96–112)
Creatinine, Ser: 0.77 mg/dL (ref 0.40–1.20)
GFR: 73.52 mL/min (ref >60.00)
Glucose, Bld: 97 mg/dL (ref 70–99)
Potassium: 3.9 meq/L (ref 3.5–5.1)
Sodium: 137 meq/L (ref 135–145)

## 2024-07-25 LAB — CBC WITH DIFFERENTIAL/PLATELET
Basophils Absolute: 0.2 K/uL — ABNORMAL HIGH (ref 0.0–0.1)
Basophils Relative: 1.3 % (ref 0.0–3.0)
Eosinophils Absolute: 0 K/uL (ref 0.0–0.7)
Eosinophils Relative: 0.4 % (ref 0.0–5.0)
HCT: 32.5 % — ABNORMAL LOW (ref 36.0–46.0)
Hemoglobin: 10.8 g/dL — ABNORMAL LOW (ref 12.0–15.0)
Lymphocytes Relative: 11.4 % — ABNORMAL LOW (ref 12.0–46.0)
Lymphs Abs: 1.3 K/uL (ref 0.7–4.0)
MCHC: 33.2 g/dL (ref 30.0–36.0)
MCV: 87.4 fl (ref 78.0–100.0)
Monocytes Absolute: 1.1 K/uL — ABNORMAL HIGH (ref 0.1–1.0)
Monocytes Relative: 9.8 % (ref 3.0–12.0)
Neutro Abs: 9 K/uL — ABNORMAL HIGH (ref 1.4–7.7)
Neutrophils Relative %: 77.1 % — ABNORMAL HIGH (ref 43.0–77.0)
Platelets: 604 K/uL — ABNORMAL HIGH (ref 150.0–400.0)
RBC: 3.72 Mil/uL — ABNORMAL LOW (ref 3.87–5.11)
RDW: 14 % (ref 11.5–15.5)
WBC: 11.7 K/uL — ABNORMAL HIGH (ref 4.0–10.5)

## 2024-07-25 NOTE — Patient Instructions (Signed)
 Go to lab Continue home PT Add ensure 1-2bottles daily Maintain small frequent meals Ok to use tylenol  500mg  2-3x/day for pain

## 2024-07-25 NOTE — Assessment & Plan Note (Addendum)
 Normocytic anemia with leukocytosis and thrombocytosis Associated with low iron and folate. Current use of oral folate supplement Acute leukocytosis possible related to acute on chronic UTI, she completed IV and oral antibiotics during hospitalization 07/12/24 to 07/18/2024.  Repeat CBC and UA today

## 2024-07-25 NOTE — Assessment & Plan Note (Signed)
 Secondary to arthritis, no erythema Associated with mild midfoot swelling DG foot 07/14/2024: Stable hallux valgus and bunionette at the 5th MTP joint. Mild dorsal soft tissue swelling proximally. Mild dorsal tarsal/metatarsal spur formation with mild progression. Minimal inferior calcaneal enthesophyte. No fracture.  No improvement with voltaren  gel Unable to use oral NSAID due to current use of xarelto  Advised to use tylenol  500mg  3x/day prn, use shoes with proper support, and use of compressions stocking.

## 2024-07-25 NOTE — Assessment & Plan Note (Addendum)
 Recent hospitalization due to severe constipation. 07/12/24 to 07/18/2024. Resolved with senokot-S. Previous use of miralax  with no improvement Today she denies any ABDOMEN pain or dysuria Last BM yesterday because she did not take sennakot last night.  Advised to maintain a high fiber diet, adequate oral hydration and daily use of Senakot-S. She is to hold senakot-S only if she develops diarrhea

## 2024-07-25 NOTE — Progress Notes (Signed)
 Established Patient Visit  Patient: Patricia Peck   DOB: 1946/10/09   78 y.o. Female  MRN: [id number] Visit Date: 07/25/2024  Subjective:    Chief Complaint  Patient presents with   Follow-up    Follow up for recent hospitalization  Refills needed    Accompanied by her [name]. HPI She lives with deward who provides 24hrs assistance with ADLs. She Had 1st session of Home PT. She is to complete PT sessions 2x/week x 3weeks. She ambulates with a walker.  Chronic constipation Recent hospitalization due to severe constipation. 07/12/24 to 07/18/2024. Resolved with senokot-S. Previous use of miralax  with no improvement Today she denies any ABDOMEN pain or dysuria Last BM yesterday because she did not take sennakot last night.  Advised to maintain a high fiber diet, adequate oral hydration and daily use of Senakot-S. She is to hold senakot-S only if she develops diarrhea  Chronic foot pain, left Secondary to arthritis, no erythema Associated with mild midfoot swelling DG foot 07/14/2024: Stable hallux valgus and bunionette at the 5th MTP joint. Mild dorsal soft tissue swelling proximally. Mild dorsal tarsal/metatarsal spur formation with mild progression. Minimal inferior calcaneal enthesophyte. No fracture.  No improvement with voltaren  gel Unable to use oral NSAID due to current use of xarelto  Advised to use tylenol  500mg  3x/day prn, use shoes with proper support, and use of compressions stocking.  Anemia, chronic disease Normocytic anemia with leukocytosis and thrombocytosis Associated with low iron and folate. Current use of oral folate supplement Acute leukocytosis possible related to acute on chronic UTI, she completed IV and oral antibiotics during hospitalization 07/12/24 to 07/18/2024.  Repeat CBC and UA today  Wt Readings from Last 3 Encounters:  07/25/24 112 lb 9.6 oz (51.1 kg)  07/14/24 117 lb 1 oz (53.1 kg)  06/17/24 117 lb (53.1 kg)    Reviewed  medical, surgical, and social history today  Medications: Outpatient Medications Prior to Visit  Medication Sig   amLODipine  (NORVASC ) 5 MG tablet Take 1 tablet (5 mg total) by mouth every evening.   atorvastatin  (LIPITOR) 80 MG tablet Take 1 tablet (80 mg total) by mouth daily.   Benzocaine -Resorcinol (ANTI-ITCH VAGINAL) 20-3 % CREA Apply 1 application  topically 2 (two) times daily as needed (irritation, itching).   bumetanide  (BUMEX ) 1 MG tablet Take 1 tablet (1 mg total) by mouth daily as needed. For leg and ankle swelling. Take with potassium chloride  10mEq   Calcium  Carb-Cholecalciferol (CALCIUM  + VITAMIN D3 PO) Take 1 tablet by mouth 2 (two) times daily.   denosumab  (PROLIA ) 60 MG/ML SOLN injection Inject 60 mg into the skin once. Administer in upper arm, thigh, or abdomen   estradiol (ESTRACE) 0.1 MG/GM vaginal cream Place 1 Applicatorful vaginally 3 (three) times a week.   ezetimibe  (ZETIA ) 10 MG tablet Take 1 tablet (10 mg total) by mouth daily.   famotidine  (PEPCID ) 40 MG tablet TAKE 1 TABLET(40 MG) BY MOUTH AT BEDTIME   folic acid  (FOLVITE ) 1 MG tablet Take 1 tablet (1 mg total) by mouth daily.   levothyroxine  (SYNTHROID ) 50 MCG tablet Take 1 tablet (50 mcg total) by mouth daily before breakfast.   methocarbamol  (ROBAXIN ) 500 MG tablet Take 1 tablet (500 mg total) by mouth every 8 (eight) hours as needed for muscle spasms.   metoprolol  succinate (TOPROL -XL) 25 MG 24 hr tablet Take 3 tablets (75 mg total) by mouth daily. Take with or immediately  following a meal.   montelukast  (SINGULAIR ) 10 MG tablet TAKE 1 TABLET(10 MG) BY MOUTH AT BEDTIME   nitrofurantoin  (MACRODANTIN ) 100 MG capsule Take 100 mg by mouth daily.   pantoprazole  (PROTONIX ) 40 MG tablet Take 1 tablet (40 mg total) by mouth 2 (two) times daily before a meal.   potassium chloride  (KLOR-CON ) 10 MEQ tablet Take 1 tablet (10 mEq total) by mouth daily as needed. For leg and ankle swelling. Take with bumex  1mg     senna-docusate (SENOKOT-S) 8.6-50 MG tablet Take 2 tablets by mouth at bedtime.   temazepam  (RESTORIL ) 7.5 MG capsule TAKE 1 CAPSULE(7.5 MG) BY MOUTH AT BEDTIME   XARELTO  15 MG TABS tablet TAKE 1 TABLET(15 MG) BY MOUTH DAILY WITH SUPPER   [DISCONTINUED] Alpha-D-Galactosidase (BEANO PO) Take 1 tablet by mouth 2 (two) times daily.   [DISCONTINUED] Cholecalciferol (VITAMIN D -3 PO) Take 1 capsule by mouth See admin instructions. Take 1 capsule by mouth on Tuesday, Thursday, Saturday.   [DISCONTINUED] CRANBERRY PO Take 2 capsules by mouth daily.   [DISCONTINUED] polyethylene glycol powder (GLYCOLAX /MIRALAX ) 17 GM/SCOOP powder Take 17 g by mouth daily.   [DISCONTINUED] VITAMIN E PO Take 1 capsule by mouth See admin instructions. Take 1 capsule by mouth on Tuesday, Thursday and Saturday.   loratadine  (CLARITIN ) 10 MG tablet Take 10 mg by mouth daily.   [DISCONTINUED] Meth-Hyo-M Bl-Na Phos-Ph Sal (URO-MP) 118 MG CAPS Take 2 capsules by mouth 2 (two) times daily.   [DISCONTINUED] potassium chloride  SA (KLOR-CON  M) 20 MEQ tablet Take 2 tablets (40 mEq total) by mouth daily for 5 days. (Patient not taking: Reported on 07/25/2024)   No facility-administered medications prior to visit.   Reviewed past medical and social history.   ROS per HPI above  Last CBC Lab Results  Component Value Date   WBC 17.0 (H) 07/16/2024   HGB 11.0 (L) 07/16/2024   HCT 34.2 (L) 07/16/2024   MCV 90.2 07/16/2024   MCH 29.0 07/16/2024   RDW 13.3 07/16/2024   PLT 647 (H) 07/16/2024   Last metabolic panel Lab Results  Component Value Date   GLUCOSE 87 07/18/2024   NA 142 07/18/2024   K 3.2 (L) 07/18/2024   CL 109 07/18/2024   CO2 20 (L) 07/18/2024   BUN 7 (L) 07/18/2024   CREATININE 0.52 07/18/2024   GFRNONAA >60 07/18/2024   CALCIUM  8.2 (L) 07/18/2024   PHOS 3.5 06/10/2024   PROT 7.8 07/12/2024   ALBUMIN 3.8 07/12/2024   LABGLOB 2.6 11/13/2023   AGRATIO 2.0 09/12/2022   BILITOT 1.0 07/12/2024   ALKPHOS 113  07/12/2024   AST 26 07/12/2024   ALT 18 07/12/2024   ANIONGAP 14 07/18/2024   Last thyroid  functions Lab Results  Component Value Date   TSH 1.33 05/05/2024   Last vitamin B12 and Folate Lab Results  Component Value Date   VITAMINB12 1,621 (H) 07/16/2024   FOLATE 3.6 (L) 07/16/2024        Objective:  BP 122/70 (BP Location: Left Arm, Patient Position: Sitting, Cuff Size: Normal)   Pulse (!) 102   Temp 98.5 F (36.9 C) (Oral)   Ht 5' 4 (1.626 m)   Wt 112 lb 9.6 oz (51.1 kg)   SpO2 98%   BMI 19.33 kg/m      Physical Exam Vitals and nursing note reviewed.  Cardiovascular:     Rate and Rhythm: Normal rate. Rhythm irregular.     Pulses: Normal pulses.  Dorsalis pedis pulses are 2+ on the right side and 2+ on the left side.       Posterior tibial pulses are 2+ on the right side and 2+ on the left side.     Heart sounds: Normal heart sounds.  Pulmonary:     Effort: Pulmonary effort is normal.     Breath sounds: Normal breath sounds.  Musculoskeletal:     Right lower leg: No edema.     Left lower leg: No edema.     Right foot: Bunion present.     Left foot: Bunion present.  Feet:     Right foot:     Skin integrity: Callus present. No erythema.     Left foot:     Skin integrity: Callus present. No erythema.     Comments: Left dorsal midfoot mild swelling Neurological:     Mental Status: She is alert.     No results found for any visits on 07/25/24.    Assessment & Plan:    Problem List Items Addressed This Visit     Anemia, chronic disease   Normocytic anemia with leukocytosis and thrombocytosis Associated with low iron and folate. Current use of oral folate supplement Acute leukocytosis possible related to acute on chronic UTI, she completed IV and oral antibiotics during hospitalization 07/12/24 to 07/18/2024.  Repeat CBC and UA today      Chronic constipation   Recent hospitalization due to severe constipation. 07/12/24 to  07/18/2024. Resolved with senokot-S. Previous use of miralax  with no improvement Today she denies any ABDOMEN pain or dysuria Last BM yesterday because she did not take sennakot last night.  Advised to maintain a high fiber diet, adequate oral hydration and daily use of Senakot-S. She is to hold senakot-S only if she develops diarrhea      Chronic foot pain, left   Secondary to arthritis, no erythema Associated with mild midfoot swelling DG foot 07/14/2024: Stable hallux valgus and bunionette at the 5th MTP joint. Mild dorsal soft tissue swelling proximally. Mild dorsal tarsal/metatarsal spur formation with mild progression. Minimal inferior calcaneal enthesophyte. No fracture.  No improvement with voltaren  gel Unable to use oral NSAID due to current use of xarelto  Advised to use tylenol  500mg  3x/day prn, use shoes with proper support, and use of compressions stocking.      Hypokalemia   Other Visit Diagnoses       Slow transit constipation    -  Primary     Stage 3b chronic kidney disease (HCC)       Relevant Orders   Basic metabolic panel with GFR   Urinalysis w microscopic + reflex cultur     Iron deficiency anemia due to chronic blood loss       Relevant Orders   CBC with Differential/Platelet      Return in about 4 weeks (around 08/22/2024) for weight loss, anemia and constipation.     Roselie Mood, NP

## 2024-07-26 ENCOUNTER — Ambulatory Visit: Payer: Self-pay | Admitting: Nurse Practitioner

## 2024-07-26 DIAGNOSIS — D649 Anemia, unspecified: Secondary | ICD-10-CM

## 2024-07-26 DIAGNOSIS — D75839 Thrombocytosis, unspecified: Secondary | ICD-10-CM

## 2024-07-26 DIAGNOSIS — D72829 Elevated white blood cell count, unspecified: Secondary | ICD-10-CM

## 2024-07-26 LAB — URINALYSIS W MICROSCOPIC + REFLEX CULTURE
Bacteria, UA: NONE SEEN /HPF
Bilirubin Urine: NEGATIVE
Glucose, UA: NEGATIVE
Hgb urine dipstick: NEGATIVE
Hyaline Cast: NONE SEEN /LPF
Ketones, ur: NEGATIVE
Leukocyte Esterase: NEGATIVE
Nitrites, Initial: NEGATIVE
Protein, ur: NEGATIVE
RBC / HPF: NONE SEEN /HPF (ref 0–2)
Specific Gravity, Urine: 1.009 (ref 1.001–1.035)
WBC, UA: NONE SEEN /HPF (ref 0–5)
pH: 7 (ref 5.0–8.0)

## 2024-07-26 LAB — NO CULTURE INDICATED

## 2024-07-28 DIAGNOSIS — I4892 Unspecified atrial flutter: Secondary | ICD-10-CM | POA: Diagnosis not present

## 2024-07-28 DIAGNOSIS — K219 Gastro-esophageal reflux disease without esophagitis: Secondary | ICD-10-CM | POA: Diagnosis not present

## 2024-07-28 DIAGNOSIS — J45909 Unspecified asthma, uncomplicated: Secondary | ICD-10-CM | POA: Diagnosis not present

## 2024-07-28 DIAGNOSIS — D649 Anemia, unspecified: Secondary | ICD-10-CM | POA: Diagnosis not present

## 2024-07-28 DIAGNOSIS — I10 Essential (primary) hypertension: Secondary | ICD-10-CM | POA: Diagnosis not present

## 2024-07-28 DIAGNOSIS — M810 Age-related osteoporosis without current pathological fracture: Secondary | ICD-10-CM | POA: Diagnosis not present

## 2024-07-28 DIAGNOSIS — F32A Depression, unspecified: Secondary | ICD-10-CM | POA: Diagnosis not present

## 2024-07-28 DIAGNOSIS — M545 Low back pain, unspecified: Secondary | ICD-10-CM | POA: Diagnosis not present

## 2024-07-30 ENCOUNTER — Other Ambulatory Visit: Payer: Self-pay | Admitting: Cardiology

## 2024-07-30 ENCOUNTER — Other Ambulatory Visit: Payer: Self-pay | Admitting: Nurse Practitioner

## 2024-07-30 DIAGNOSIS — I4821 Permanent atrial fibrillation: Secondary | ICD-10-CM

## 2024-07-30 DIAGNOSIS — E7800 Pure hypercholesterolemia, unspecified: Secondary | ICD-10-CM

## 2024-08-01 ENCOUNTER — Telehealth: Payer: Self-pay | Admitting: Nurse Practitioner

## 2024-08-01 DIAGNOSIS — J45909 Unspecified asthma, uncomplicated: Secondary | ICD-10-CM | POA: Diagnosis not present

## 2024-08-01 DIAGNOSIS — K219 Gastro-esophageal reflux disease without esophagitis: Secondary | ICD-10-CM | POA: Diagnosis not present

## 2024-08-01 DIAGNOSIS — I480 Paroxysmal atrial fibrillation: Secondary | ICD-10-CM | POA: Diagnosis not present

## 2024-08-01 DIAGNOSIS — M19041 Primary osteoarthritis, right hand: Secondary | ICD-10-CM

## 2024-08-01 DIAGNOSIS — G8929 Other chronic pain: Secondary | ICD-10-CM

## 2024-08-01 DIAGNOSIS — I4892 Unspecified atrial flutter: Secondary | ICD-10-CM | POA: Diagnosis not present

## 2024-08-01 DIAGNOSIS — M19042 Primary osteoarthritis, left hand: Secondary | ICD-10-CM

## 2024-08-01 DIAGNOSIS — M545 Low back pain, unspecified: Secondary | ICD-10-CM | POA: Diagnosis not present

## 2024-08-01 DIAGNOSIS — F32A Depression, unspecified: Secondary | ICD-10-CM | POA: Diagnosis not present

## 2024-08-01 DIAGNOSIS — I10 Essential (primary) hypertension: Secondary | ICD-10-CM

## 2024-08-01 DIAGNOSIS — D649 Anemia, unspecified: Secondary | ICD-10-CM | POA: Diagnosis not present

## 2024-08-01 DIAGNOSIS — M810 Age-related osteoporosis without current pathological fracture: Secondary | ICD-10-CM | POA: Diagnosis not present

## 2024-08-01 NOTE — Telephone Encounter (Signed)
 Patient dropped off document Home Health Certificate (Order ID [id number]), to be filled out by provider. Patient requested to send it back via Fax within 7-days. Document is located in providers tray at front office.Please advise at Mobile [phone] (mobile)   Home health order came through fax. I put in the dr box

## 2024-08-01 NOTE — Telephone Encounter (Signed)
CLINICAL USE BELOW THIS LINE (use X to signify taken)  __X__Form received and placed in providers office for signature. ____Form completed and faxed to LOA Dept. ____Form completed & LVM to notify pt ready for pick up ____Charge sheet & copy of form in front office folder for office supervisor.   

## 2024-08-03 ENCOUNTER — Other Ambulatory Visit: Payer: Self-pay

## 2024-08-05 ENCOUNTER — Ambulatory Visit: Admitting: Nurse Practitioner

## 2024-08-09 ENCOUNTER — Ambulatory Visit: Admitting: Internal Medicine

## 2024-08-09 DIAGNOSIS — F32A Depression, unspecified: Secondary | ICD-10-CM | POA: Diagnosis not present

## 2024-08-11 ENCOUNTER — Ambulatory Visit
Admission: RE | Admit: 2024-08-11 | Discharge: 2024-08-11 | Disposition: A | Discharge status: Home / Self Care | Source: Ambulatory Visit | Attending: Nurse Practitioner | Admitting: Nurse Practitioner

## 2024-08-11 DIAGNOSIS — Z1231 Encounter for screening mammogram for malignant neoplasm of breast: Secondary | ICD-10-CM

## 2024-08-12 DIAGNOSIS — K219 Gastro-esophageal reflux disease without esophagitis: Secondary | ICD-10-CM | POA: Diagnosis not present

## 2024-08-12 DIAGNOSIS — M545 Low back pain, unspecified: Secondary | ICD-10-CM | POA: Diagnosis not present

## 2024-08-12 DIAGNOSIS — I480 Paroxysmal atrial fibrillation: Secondary | ICD-10-CM | POA: Diagnosis not present

## 2024-08-12 DIAGNOSIS — F32A Depression, unspecified: Secondary | ICD-10-CM | POA: Diagnosis not present

## 2024-08-12 DIAGNOSIS — I4892 Unspecified atrial flutter: Secondary | ICD-10-CM | POA: Diagnosis not present

## 2024-08-12 DIAGNOSIS — M810 Age-related osteoporosis without current pathological fracture: Secondary | ICD-10-CM | POA: Diagnosis not present

## 2024-08-12 DIAGNOSIS — I10 Essential (primary) hypertension: Secondary | ICD-10-CM | POA: Diagnosis not present

## 2024-08-12 DIAGNOSIS — D649 Anemia, unspecified: Secondary | ICD-10-CM | POA: Diagnosis not present

## 2024-08-15 ENCOUNTER — Other Ambulatory Visit: Payer: Self-pay | Admitting: Nurse Practitioner

## 2024-08-15 ENCOUNTER — Other Ambulatory Visit (HOSPITAL_COMMUNITY): Payer: Self-pay

## 2024-08-15 ENCOUNTER — Other Ambulatory Visit: Payer: Self-pay

## 2024-08-15 DIAGNOSIS — M810 Age-related osteoporosis without current pathological fracture: Secondary | ICD-10-CM

## 2024-08-15 MED ORDER — PROLIA 60 MG/ML ~~LOC~~ SOSY
60.0000 mg | PREFILLED_SYRINGE | Freq: Once | SUBCUTANEOUS | 0 refills | Status: AC
  Filled 2024-08-15: qty 1, 1d supply, fill #0
  Filled 2024-08-17 – 2024-08-23 (×2): qty 1, 180d supply, fill #0

## 2024-08-16 ENCOUNTER — Ambulatory Visit: Admitting: Gastroenterology

## 2024-08-16 ENCOUNTER — Other Ambulatory Visit: Payer: Self-pay

## 2024-08-16 ENCOUNTER — Encounter: Payer: Self-pay | Admitting: Gastroenterology

## 2024-08-16 VITALS — BP 124/84 | HR 58 | Ht 63.0 in | Wt 112.5 lb

## 2024-08-16 DIAGNOSIS — D638 Anemia in other chronic diseases classified elsewhere: Secondary | ICD-10-CM

## 2024-08-16 DIAGNOSIS — K449 Diaphragmatic hernia without obstruction or gangrene: Secondary | ICD-10-CM

## 2024-08-16 DIAGNOSIS — K5900 Constipation, unspecified: Secondary | ICD-10-CM

## 2024-08-16 DIAGNOSIS — K219 Gastro-esophageal reflux disease without esophagitis: Secondary | ICD-10-CM | POA: Diagnosis not present

## 2024-08-16 DIAGNOSIS — Z09 Encounter for follow-up examination after completed treatment for conditions other than malignant neoplasm: Secondary | ICD-10-CM

## 2024-08-16 NOTE — Patient Instructions (Signed)
 Continue your daily Miralax .   Take over the counter Senakot.   Continue your protonix  40mg  twice daily.    Call if any recurrence of symptoms.   I appreciate the opportunity to care for you. [facility]

## 2024-08-16 NOTE — Progress Notes (Signed)
 Patricia Peck [id number] 30-Dec-1945   Chief Complaint: GERD, constipation  Referring Provider: Katheen Roselie Rockford, NP Primary GI MD: Dr. Avram  HPI: Patricia Peck is a 78 y.o. female with past medical history of arthritis, asthma, A-fib, a flutter, depression, GERD, HLD, HTN, hypothyroidism, osteoporosis, TIA 2017, hysterectomy who presents today for follow up of GERD and constipation.    Seen by PCP 03/24/2024 and noted to have a moderate-sized hiatal hernia with severe heartburn and indigestion.  Nexium  20 mg was insufficient and she was increased to 40 mg daily, famotidine  increased to 40 mg at night, and sucralfate  added for breakthrough heartburn.  Referred for further evaluation.   Also had some weight loss from 120 pounds to 111 pounds.  Was having constipation was advised to take MiraLAX  1 capful daily as well as senna as needed.   Seen by PCP 05/05/2024 and noted to have constant heartburn with no improvement in heartburn despite use of Carafate , famotidine , and Nexium .  She was switched to pantoprazole  40 mg twice daily.  Seen in office 06/17/2024 at which time she reported intermittent heartburn after meals a couple times a week despite use of PPI and famotidine .  No record of prior EGD.  Recent CT showed a moderate-sized sliding-type hiatal hernia.  UGI series was ordered with plan for EGD if symptoms persisted or any concerning findings on imaging.  Advised to continue Protonix  40 mg twice daily and famotidine  40 mg in the evening Also endorsed constipation which had been improved with MiraLAX  and herbal tea/senna.  Last colonoscopy 2015 showed  sigmoid diverticulosis and otherwise normal.  Consideration for repeat colonoscopy in 10 years pending general health based on age.  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  Advice from Dr. Avram was to set up an EGD if reflux not improved, with  possible dilation.  Called patient to discuss.  Her symptoms had improved over the last few weeks and weight was stable.  EGD not scheduled.  Patient admitted to the hospital 07/12/2024 to 07/18/2024 for severe constipation and abdominal pain secondary to constipation.  She had presented with worsening abdominal and back pain, improved with a muscle relaxer but she had run out of this and pain had been uncontrolled.  She denied any dysphagia.  CT of the abdomen and pelvis notable for subtle urothelial enhancement in the left renal pelvis, hiatal hernia, and minimal stool.  Etiology of her chronic abdominal back pain unclear.  She was started on laxatives and had bowel movement with improvement in her abdominal pain.  Was on MiraLAX  twice a day and Senokot twice a day, had several bowel movements with some loose stools and laxative dose was decreased.  Abdominal pain significantly improved.  GI was consulted.  KUB reviewed and showed significant amount of stool and retained barium in the colon.  Normocytic anemia most consistent with anemia of chronic disease and folate deficiency, with no evidence of GI blood loss.  She did have an elevated white blood cell count.  UA was unremarkable however CT scan suggested UTI.  Cultures without growth.  She was given 5-day course of ceftriaxone . Anemia panel showed low folic acid , started on folic acid  supplementation.  Seen by PCP 07/25/2024.  Noted to have low iron and folate.  Normocytic anemia.  Denied any abdominal pain at that visit.  Advised to maintain high-fiber diet, adequate oral hydration, daily use of Senokot, only holding  if diarrhea.    Patient states she is doing very well since hospital discharge.  Has been taking MiraLAX  1 capful daily and this is working very well for her.  Has Senokot at home as well but has not felt that she needed to use it.  She is having a bowel movement daily and passing a substantial amount of stool each time.  Denies any  abdominal pain, rectal bleeding, or melena.  States that her GERD symptoms are very well-controlled on current regimen of Protonix  40 mg twice daily.  Denies any breakthrough symptoms and denies dysphagia.  States that she has an appointment Thursday with hematology/oncology for further evaluation and management of her anemia.  Previous GI Procedures/Imaging   CT A/P 07/12/2024 1. Subtle urothelial enhancement noted in the left renal pelvis, worrisome for an ascending urinary tract infection. Correlation with urinalysis recommended for further characterization. No hydronephrosis or nephrolithiasis. 2. Moderate-sized sliding-type hiatal hernia. Periampullary duodenum and colonic diverticulosis. No changes of acute diverticulitis. 3. The colon is full of hyperdense contrast material. Minimal stool noted in the ascending colon. 4. Trace bilateral pleural effusions.  UGI series 07/05/2024 1. Small type 1 hiatal hernia. Mild gastroesophageal reflux observed. 2. Cervical esophageal web noted. 3. Probable widely patent distal esophageal mucosal ring. The barium tablet passed through this region without delay. 4. Mild cardiomegaly.  CT A/P 03/15/2024 - Moderate size sliding-type hiatal hernia. - Two small rounded low densities are noted in right hepatic lobe of uncertain etiology. These most likely represent cysts in the absence of any history of malignancy. If the patient does have a history of malignancy, MRI is recommended to evaluate for possible metastatic disease. 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 - Aortic Atherosclerosis (ICD10-I70.0).   Colonoscopy 07/20/2014 - Mild diverticulosis noted in the sigmoid colon - Otherwise normal   Colonoscopy 02/23/2004 - Normal   Past Medical History:  Diagnosis Date   Allergy    Arthritis    hands   Asthma    uses an inhaler   Atrial fibrillation (HCC)    Atrial flutter (HCC)    Depression     Dysrhythmia    a-fib, on Xarelto    GERD (gastroesophageal reflux disease)    Hyperlipidemia    Hypertension    Hypotension 03/24/2024   Hypothyroidism    Osteoporosis    Stroke (HCC) 2017   TIA   TIA (transient ischemic attack)     Past Surgical History:  Procedure Laterality Date   ABDOMINAL HYSTERECTOMY     COLONOSCOPY     MYRINGOTOMY WITH TUBE PLACEMENT Right 01/14/2016   Procedure: RIGHT MYRINGOTOMY WITH T-TUBE PLACEMENT;  Surgeon: Daniel Moccasin, MD;  Location: [facility];  Service: ENT;  Laterality: Right;   PARTIAL HYSTERECTOMY  1989   ROBOTIC ASSISTED BILATERAL SALPINGO OOPHERECTOMY Bilateral 02/14/2021   Procedure: XI ROBOTIC ASSISTED BILATERAL SALPINGO OOPHORECTOMY;  Surgeon: Eloy Herring, MD;  Location: [facility];  Service: Gynecology;  Laterality: Bilateral;   TEE WITHOUT CARDIOVERSION N/A 12/22/2012   Procedure: TRANSESOPHAGEAL ECHOCARDIOGRAM (TEE);  Surgeon: Redell GORMAN Shallow, MD;  Location: [facility];  Service: Cardiovascular;  Laterality: N/A;    Current Outpatient Medications  Medication Sig Dispense Refill   amLODipine  (NORVASC ) 5 MG tablet Take 1 tablet (5 mg total) by mouth every evening. 90 tablet 1   atorvastatin  (LIPITOR) 80 MG tablet Take 1 tablet (80 mg total) by mouth daily. 90 tablet 1   Benzocaine -Resorcinol (ANTI-ITCH VAGINAL) 20-3 % CREA Apply  1 application  topically 2 (two) times daily as needed (irritation, itching).     bumetanide  (BUMEX ) 1 MG tablet Take 1 tablet (1 mg total) by mouth daily as needed. For leg and ankle swelling. Take with potassium chloride  10mEq 30 tablet 5   Calcium  Carb-Cholecalciferol (CALCIUM  + VITAMIN D3 PO) Take 1 tablet by mouth 2 (two) times daily.     denosumab  (PROLIA ) 60 MG/ML SOLN injection Inject 60 mg into the skin once. Administer in upper arm, thigh, or abdomen 180 mL 0   denosumab  (PROLIA ) 60 MG/ML SOSY injection Inject 60 mg into the skin once for 1 dose. 1 mL 0   estradiol (ESTRACE) 0.1 MG/GM vaginal cream  Place 1 Applicatorful vaginally 3 (three) times a week.     ezetimibe  (ZETIA ) 10 MG tablet TAKE 1 TABLET(10 MG) BY MOUTH DAILY 90 tablet 0   famotidine  (PEPCID ) 40 MG tablet TAKE 1 TABLET(40 MG) BY MOUTH AT BEDTIME 90 tablet 0   folic acid  (FOLVITE ) 1 MG tablet Take 1 tablet (1 mg total) by mouth daily. 30 tablet 2   levothyroxine  (SYNTHROID ) 50 MCG tablet Take 1 tablet (50 mcg total) by mouth daily before breakfast. 90 tablet 1   loratadine  (CLARITIN ) 10 MG tablet Take 10 mg by mouth daily.     methocarbamol  (ROBAXIN ) 500 MG tablet Take 1 tablet (500 mg total) by mouth every 8 (eight) hours as needed for muscle spasms. 15 tablet 0   metoprolol  succinate (TOPROL -XL) 25 MG 24 hr tablet Take 3 tablets (75 mg total) by mouth daily. Take with or immediately following a meal. 90 tablet 1   montelukast  (SINGULAIR ) 10 MG tablet TAKE 1 TABLET(10 MG) BY MOUTH AT BEDTIME 90 tablet 1   nitrofurantoin  (MACRODANTIN ) 100 MG capsule Take 100 mg by mouth daily.     pantoprazole  (PROTONIX ) 40 MG tablet Take 1 tablet (40 mg total) by mouth 2 (two) times daily before a meal. 60 tablet 5   polyethylene glycol powder (GLYCOLAX /MIRALAX ) 17 GM/SCOOP powder Take 17 g by mouth daily. Dissolve 1 capful (17g) in 4-8 ounces of liquid and take by mouth daily.     potassium chloride  (KLOR-CON ) 10 MEQ tablet Take 1 tablet (10 mEq total) by mouth daily as needed. For leg and ankle swelling. Take with bumex  1mg  90 tablet 0   senna-docusate (SENOKOT-S) 8.6-50 MG tablet Take 2 tablets by mouth at bedtime. 60 tablet 2   temazepam  (RESTORIL ) 7.5 MG capsule TAKE 1 CAPSULE(7.5 MG) BY MOUTH AT BEDTIME 30 capsule 5   XARELTO  15 MG TABS tablet TAKE 1 TABLET(15 MG) BY MOUTH DAILY WITH SUPPER 90 tablet 1   No current facility-administered medications for this visit.    Allergies as of 08/16/2024   (No Known Allergies)    Family History  Problem Relation Age of Onset   Breast cancer Mother    Drug abuse Son    Depression Son     Stroke Paternal Aunt    Heart disease Paternal Grandfather    Diabetes Maternal Grandfather    Colon cancer Neg Hx    Ovarian cancer Neg Hx    Endometrial cancer Neg Hx    Pancreatic cancer Neg Hx    Prostate cancer Neg Hx     Social History   Tobacco Use   Smoking status: Never    Passive exposure: Never   Smokeless tobacco: Never  Vaping Use   Vaping status: Never Used  Substance Use Topics   Alcohol use: Not Currently  Comment: Rare   Drug use: No     Review of Systems:    Constitutional: No unexplained weight loss, fever, chills Cardiovascular: No chest pain Respiratory: No SOB  Gastrointestinal: See HPI and otherwise negative Genitourinary: No dysuria or change in urinary frequency Neurological: No headache, dizziness or syncope Musculoskeletal: No new muscle or joint pain Hematologic: No bleeding or bruising    Physical Exam:  Vital signs: BP 124/84 (BP Location: Left Arm)   Pulse (!) 58   Ht 5' 3 (1.6 m)   Wt 112 lb 8 oz (51 kg)   BMI 19.93 kg/m   Wt Readings from Last 3 Encounters:  08/16/24 112 lb 8 oz (51 kg)  07/25/24 112 lb 9.6 oz (51.1 kg)  07/14/24 117 lb 1 oz (53.1 kg)    Constitutional: Pleasant, well-appearing female in NAD, alert and cooperative Head:  Normocephalic and atraumatic.  Eyes: No scleral icterus.  Respiratory: Respirations even and unlabored. Lungs clear to auscultation bilaterally.  No wheezes, crackles, or rhonchi.  Cardiovascular:  Regular rate, irregular rhythm. No murmurs. No peripheral edema. Gastrointestinal:  Soft, nondistended, nontender. No rebound or guarding. Normal bowel sounds.  Rectal:  Not performed.  Neurologic:  Alert and oriented x4;  grossly normal neurologically.  Skin:   Dry and intact without significant lesions or rashes. Psychiatric: Oriented to person, place and time. Demonstrates good judgement and reason without abnormal affect or behaviors.   RELEVANT LABS AND IMAGING: CBC    Component Value  Date/Time   WBC 11.7 (H) 07/25/2024 1434   RBC 3.72 (L) 07/25/2024 1434   HGB 10.8 (L) 07/25/2024 1434   HGB 13.6 11/13/2023 0917   HCT 32.5 (L) 07/25/2024 1434   HCT 41.4 11/13/2023 0917   PLT 604.0 (H) 07/25/2024 1434   PLT 354 11/13/2023 0917   MCV 87.4 07/25/2024 1434   MCV 90 11/13/2023 0917   MCH 29.0 07/16/2024 0747   MCHC 33.2 07/25/2024 1434   RDW 14.0 07/25/2024 1434   RDW 12.5 11/13/2023 0917   LYMPHSABS 1.3 07/25/2024 1434   MONOABS 1.1 (H) 07/25/2024 1434   EOSABS 0.0 07/25/2024 1434   BASOSABS 0.2 (H) 07/25/2024 1434    CMP     Component Value Date/Time   NA 137 07/25/2024 1434   NA 142 11/13/2023 0917   K 3.9 07/25/2024 1434   CL 101 07/25/2024 1434   CO2 26 07/25/2024 1434   GLUCOSE 97 07/25/2024 1434   BUN 10 07/25/2024 1434   BUN 17 11/13/2023 0917   CREATININE 0.77 07/25/2024 1434   CREATININE 0.98 (H) 05/23/2019 1429   CALCIUM  9.9 07/25/2024 1434   PROT 7.8 07/12/2024 1125   PROT 7.4 11/13/2023 0917   ALBUMIN 3.8 07/12/2024 1125   ALBUMIN 4.8 11/13/2023 0917   AST 26 07/12/2024 1125   ALT 18 07/12/2024 1125   ALKPHOS 113 07/12/2024 1125   BILITOT 1.0 07/12/2024 1125   BILITOT 0.5 11/13/2023 0917   GFRNONAA >60 07/18/2024 0442   GFRNONAA 72 10/04/2014 1216   GFRAA 59 (L) 03/26/2020 1023   GFRAA 83 10/04/2014 1216   Echocardiogram 12/07/2020 1. Left ventricular ejection fraction, by estimation, is 55 to 60% . The left ventricle has normal function. The left ventricle has no regional wall motion abnormalities. Left ventricular diastolic parameters are indeterminate.  2. Right ventricular systolic function is normal. The right ventricular size is normal.  3. The mitral valve is grossly normal. Trivial mitral valve regurgitation.  4. The aortic valve is tricuspid.  Aortic valve regurgitation is mild. No aortic stenosis is present.  5. There is borderline dilatation of the ascending aorta, measuring 37 mm.  6. The inferior vena cava is normal in size  with greater than 50% respiratory variability, suggesting right atrial pressure of 3 mmHg.  Assessment/Plan:   GERD Hiatal hernia Anemia of chronic disease Constipation Patient seen today for hospital follow-up.  Admitted 07/12/2024 to 07/18/2024 for severe constipation and abdominal pain secondary to constipation.  GI was consulted.  Had improvement with smog enema and was started on MiraLAX  twice daily and Senokot twice daily.  Ultimately dose of laxatives was decreased due to diarrhea. Anemia panel showed low folic acid  and she was started on supplementation.  Per Dr. Albertus, iron studies consistent with anemia of chronic disease and not that of iron deficiency.  She has an appointment this week with hematology/oncology for further evaluation and management of her anemia.  Patient states she is doing very well today and denies any concerns at this time.  She is having a daily bowel movement on MiraLAX  1 capful daily and denies any evidence of GI bleeding.  Does have Senokot at home but does not feel the need to use it at this time.  Denies abdominal pain.  GERD symptoms are well-controlled on twice daily Protonix  and she denies any breakthrough symptoms or dysphagia.  - Continue Protonix  40 mg twice daily - Continue MiraLAX  1 capful daily, Senokot as needed - Follow-up as needed   Camie Furbish, PA-C [facility] 08/16/2024, 9:09 AM  Patient Care Team: Nche, Roselie Rockford, NP as PCP - General (Internal Medicine) Pietro Redell RAMAN, MD as PCP - Cardiology (Cardiology) Pietro Redell RAMAN, MD as Consulting Physician (Cardiology) Karis Clunes, MD as Consulting Physician (Otolaryngology) Frutoso Luz, MD as Referring Physician (Allergy)

## 2024-08-17 ENCOUNTER — Other Ambulatory Visit (HOSPITAL_COMMUNITY): Payer: Self-pay

## 2024-08-17 ENCOUNTER — Other Ambulatory Visit: Payer: Self-pay

## 2024-08-17 NOTE — Progress Notes (Unsigned)
 [facility] CONSULT NOTE  Patient Care Team: Nche, Roselie Rockford, NP as PCP - General (Internal Medicine) Pietro Redell RAMAN, MD as PCP - Cardiology (Cardiology) Pietro Redell RAMAN, MD as Consulting Physician (Cardiology) Karis Clunes, MD as Consulting Physician (Otolaryngology) Frutoso Luz, MD as Referring Physician (Allergy)   ASSESSMENT & PLAN 78 y.o.female with history of chronic abdominal pain, stroke, HTN, HLD, arthritis, afib/aflutter on Xarelto , hiatal hernia, GERD, DDD being seen for leukocytosis.  Patient recently presented with acute on chronic abdominal pain, SIRS found leukocytosis. It improved by the time of discharged. Other studies showed anemia of inflammation, thrombocytosis both acute suggesting of acute reactive process. There is folate deficiency.  The etiology is not clear at this time and requires further testing and investigation.  Assessment & Plan   No orders of the defined types were placed in this encounter.    WBC count in 2.5 % of the normal population will be greater than two standard deviations above the mean (ie, >11,000/microL).  Check CRP for inflammatory disease Blood Smear for morphology Check JAK 2, BCR-ABL if combination of neutrophilia and polycythemia, thrombocytosis, basophilia, eosinophilia   All questions were answered. The patient knows to call the clinic with any problems, questions or concerns.  I personally spent a total of *** minutes in the care of the patient today including {Time Based Coding:[id number]}.    Pauletta JAYSON Chihuahua, MD 08/17/2024 9:54 PM   CHIEF COMPLAINTS/PURPOSE OF CONSULTATION:  Leukocytosis   HISTORY OF PRESENTING ILLNESS:  Patricia Peck 78 y.o. female is here because of elevated WBC.  ***She was found to have abnormal CBC from *** ***She denies recent infection. The last prescription antibiotics was more than 3 months ago There is not reported symptoms of sinus congestion, cough, urinary  frequency/urgency or dysuria, diarrhea, joint swelling/pain or abnormal skin rash.  ***She had no prior history or diagnosis of cancer. Her age appropriate screening programs are up-to-date. The patient has no prior diagnosis of autoimmune disease and was not prescribed corticosteroids related products. *** The patient is a smoker and currently smokes *** pack of cigarettes per day for the last *** years.   Cigarette smoking {Yes/No:30480221} Active inflammatory or infectious condition {Yes/No:30480221} Pregnancy and after delivery {Yes/No:30480221} Eclampsia {Yes/No:30480221} Chronic anxiety state, panic disorder, rage or emotional stress and PTSD or on Lithium {Yes/No:30480221} Recent vigorous exercise {Yes/No:30480221} Recent burn, electric shock {Yes/No:30480221}} Recent surgery or trauma {Yes/No:30480221} Thyroid  storm, hypercortisolism {Yes/No:30480221} Prior splenectomy or asplenia {Yes/No:30480221} Family history of neutrophilia {Yes/No:30480221} Post-antibiotic diarrhea {Yes/No:30480221} Glucocorticoid {Yes/No:30480221} Recent vaccination {Yes/No:30480221} Recent snake bite {Yes/No:30480221} Sickle cell disease {Yes/No:30480221} Previous hematologic disease {Yes/No:30480221} Medications: Lithium, epinephrine, ATRA {Yes/No:30480221}   MEDICAL HISTORY:  Past Medical History:  Diagnosis Date   Allergy    Arthritis    hands   Asthma    uses an inhaler   Atrial fibrillation (HCC)    Atrial flutter (HCC)    Depression    Dysrhythmia    a-fib, on Xarelto    GERD (gastroesophageal reflux disease)    Hyperlipidemia    Hypertension    Hypotension 03/24/2024   Hypothyroidism    Osteoporosis    Stroke (HCC) 2017   TIA   TIA (transient ischemic attack)     SURGICAL HISTORY: Past Surgical History:  Procedure Laterality Date   ABDOMINAL HYSTERECTOMY     COLONOSCOPY     MYRINGOTOMY WITH TUBE PLACEMENT Right 01/14/2016   Procedure: RIGHT MYRINGOTOMY WITH T-TUBE  PLACEMENT;  Surgeon: Clunes Karis, MD;  Location: [facility];  Service: ENT;  Laterality: Right;   PARTIAL HYSTERECTOMY  1989   ROBOTIC ASSISTED BILATERAL SALPINGO OOPHERECTOMY Bilateral 02/14/2021   Procedure: XI ROBOTIC ASSISTED BILATERAL SALPINGO OOPHORECTOMY;  Surgeon: Eloy Herring, MD;  Location: [facility];  Service: Gynecology;  Laterality: Bilateral;   TEE WITHOUT CARDIOVERSION N/A 12/22/2012   Procedure: TRANSESOPHAGEAL ECHOCARDIOGRAM (TEE);  Surgeon: Redell GORMAN Shallow, MD;  Location: [facility];  Service: Cardiovascular;  Laterality: N/A;    SOCIAL HISTORY: Social History   Socioeconomic History   Marital status: Widowed    Spouse name: Not on file   Number of children: 2   Years of education: Not on file   Highest education level: Some college, no degree  Occupational History    Employer: RETIRED  Tobacco Use   Smoking status: Never    Passive exposure: Never   Smokeless tobacco: Never  Vaping Use   Vaping status: Never Used  Substance and Sexual Activity   Alcohol use: Not Currently    Comment: Rare   Drug use: No   Sexual activity: Not Currently  Other Topics Concern   Not on file  Social History Narrative   Does have HPOA- sons Deward and Larnell.   Does desire life support if not futile.   Not sure about feeding tubes.   Social Drivers of Corporate investment banker Strain: Low Risk  (11/26/2023)   Overall Financial Resource Strain (CARDIA)    Difficulty of Paying Living Expenses: Not hard at all  Food Insecurity: No Food Insecurity (07/12/2024)   Hunger Vital Sign    Worried About Running Out of Food in the Last Year: Never true    Ran Out of Food in the Last Year: Never true  Transportation Needs: No Transportation Needs (07/12/2024)   PRAPARE - Administrator, Civil Service (Medical): No    Lack of Transportation (Non-Medical): No  Physical Activity: Insufficiently Active (11/26/2023)   Exercise Vital Sign    Days of Exercise per Week: 4 days     Minutes of Exercise per Session: 30 min  Stress: Stress Concern Present (11/26/2023)   [employer] of Occupational Health - Occupational Stress Questionnaire    Feeling of Stress : To some extent  Social Connections: Moderately Integrated (07/12/2024)   Social Connection and Isolation Panel    Frequency of Communication with Friends and Family: Three times a week    Frequency of Social Gatherings with Friends and Family: Once a week    Attends Religious Services: 1 to 4 times per year    Active Member of [employer] or Organizations: Yes    Attends Banker Meetings: More than 4 times per year    Marital Status: Widowed  Intimate Partner Violence: Not At Risk (07/12/2024)   Humiliation, Afraid, Rape, and Kick questionnaire    Fear of Current or Ex-Partner: No    Emotionally Abused: No    Physically Abused: No    Sexually Abused: No    FAMILY HISTORY: Family History  Problem Relation Age of Onset   Breast cancer Mother    Drug abuse Son    Depression Son    Stroke Paternal Aunt    Heart disease Paternal Grandfather    Diabetes Maternal Grandfather    Colon cancer Neg Hx    Ovarian cancer Neg Hx    Endometrial cancer Neg Hx    Pancreatic cancer Neg Hx    Prostate cancer Neg Hx  ALLERGIES:  has no known allergies.  MEDICATIONS:  Current Outpatient Medications  Medication Sig Dispense Refill   amLODipine  (NORVASC ) 5 MG tablet Take 1 tablet (5 mg total) by mouth every evening. 90 tablet 1   atorvastatin  (LIPITOR) 80 MG tablet Take 1 tablet (80 mg total) by mouth daily. 90 tablet 1   Benzocaine -Resorcinol (ANTI-ITCH VAGINAL) 20-3 % CREA Apply 1 application  topically 2 (two) times daily as needed (irritation, itching).     bumetanide  (BUMEX ) 1 MG tablet Take 1 tablet (1 mg total) by mouth daily as needed. For leg and ankle swelling. Take with potassium chloride  10mEq 30 tablet 5   Calcium  Carb-Cholecalciferol (CALCIUM  + VITAMIN D3 PO) Take 1 tablet by mouth 2  (two) times daily.     denosumab  (PROLIA ) 60 MG/ML SOLN injection Inject 60 mg into the skin once. Administer in upper arm, thigh, or abdomen 180 mL 0   estradiol (ESTRACE) 0.1 MG/GM vaginal cream Place 1 Applicatorful vaginally 3 (three) times a week.     ezetimibe  (ZETIA ) 10 MG tablet TAKE 1 TABLET(10 MG) BY MOUTH DAILY 90 tablet 0   famotidine  (PEPCID ) 40 MG tablet TAKE 1 TABLET(40 MG) BY MOUTH AT BEDTIME 90 tablet 0   folic acid  (FOLVITE ) 1 MG tablet Take 1 tablet (1 mg total) by mouth daily. 30 tablet 2   levothyroxine  (SYNTHROID ) 50 MCG tablet Take 1 tablet (50 mcg total) by mouth daily before breakfast. 90 tablet 1   loratadine  (CLARITIN ) 10 MG tablet Take 10 mg by mouth daily.     methocarbamol  (ROBAXIN ) 500 MG tablet Take 1 tablet (500 mg total) by mouth every 8 (eight) hours as needed for muscle spasms. 15 tablet 0   metoprolol  succinate (TOPROL -XL) 25 MG 24 hr tablet Take 3 tablets (75 mg total) by mouth daily. Take with or immediately following a meal. 90 tablet 1   montelukast  (SINGULAIR ) 10 MG tablet TAKE 1 TABLET(10 MG) BY MOUTH AT BEDTIME 90 tablet 1   nitrofurantoin  (MACRODANTIN ) 100 MG capsule Take 100 mg by mouth daily.     pantoprazole  (PROTONIX ) 40 MG tablet Take 1 tablet (40 mg total) by mouth 2 (two) times daily before a meal. 60 tablet 5   polyethylene glycol powder (GLYCOLAX /MIRALAX ) 17 GM/SCOOP powder Take 17 g by mouth daily. Dissolve 1 capful (17g) in 4-8 ounces of liquid and take by mouth daily.     potassium chloride  (KLOR-CON ) 10 MEQ tablet Take 1 tablet (10 mEq total) by mouth daily as needed. For leg and ankle swelling. Take with bumex  1mg  90 tablet 0   senna-docusate (SENOKOT-S) 8.6-50 MG tablet Take 2 tablets by mouth at bedtime. 60 tablet 2   temazepam  (RESTORIL ) 7.5 MG capsule TAKE 1 CAPSULE(7.5 MG) BY MOUTH AT BEDTIME 30 capsule 5   XARELTO  15 MG TABS tablet TAKE 1 TABLET(15 MG) BY MOUTH DAILY WITH SUPPER 90 tablet 1   No current facility-administered  medications for this visit.    REVIEW OF SYSTEMS:   All relevant systems were reviewed with the patient and are negative.  PHYSICAL EXAMINATION: ECOG PERFORMANCE STATUS: {CHL ONC ECOG PS:[phone]}  There were no vitals filed for this visit. There were no vitals filed for this visit.  GENERAL: alert, no distress and comfortable SKIN: skin color normal, no rashes or significant lesions EYES: normal, sclera clear OROPHARYNX: no exudate, no erythema  NECK: supple, non-tender, without nodularity LYMPH:  no palpable cervical, axillary lymphadenopathy LUNGS: clear to auscultation and no wheezes, rales and with  normal breathing effort HEART: regular rate & rhythm and no murmurs ABDOMEN: abdomen soft, non-tender and nondistended Musculoskeletal: and no lower extremity edema NEURO: no focal motor/sensory deficits  LABORATORY DATA:  I have reviewed the data as listed Recent Results (from the past 2160 hours)  Renal Function Panel     Status: Abnormal   Collection Time: 06/10/24 10:05 AM  Result Value Ref Range   Sodium 142 135 - 145 mEq/L   Potassium 3.8 3.5 - 5.1 mEq/L   Chloride 105 96 - 112 mEq/L   CO2 27 19 - 32 mEq/L   Albumin 4.5 3.5 - 5.2 g/dL   BUN 15 6 - 23 mg/dL   Creatinine, Ser 9.04 0.40 - 1.20 mg/dL   Glucose, Bld 83 70 - 99 mg/dL   Phosphorus 3.5 2.3 - 4.6 mg/dL   GFR 42.80 (L) >39.99 mL/min    Comment: Calculated using the CKD-EPI Creatinine Equation (2021)   Calcium  8.7 8.4 - 10.5 mg/dL  CBC with Differential     Status: Abnormal   Collection Time: 07/12/24 11:25 AM  Result Value Ref Range   WBC 20.7 (H) 4.0 - 10.5 K/uL   RBC 3.97 3.87 - 5.11 MIL/uL   Hemoglobin 11.8 (L) 12.0 - 15.0 g/dL   HCT 65.3 (L) 63.9 - 53.9 %   MCV 87.2 80.0 - 100.0 fL   MCH 29.7 26.0 - 34.0 pg   MCHC 34.1 30.0 - 36.0 g/dL   RDW 87.3 88.4 - 84.4 %   Platelets 469 (H) 150 - 400 K/uL   nRBC 0.0 0.0 - 0.2 %   Neutrophils Relative % 86 %   Neutro Abs 17.6 (H) 1.7 - 7.7 K/uL    Lymphocytes Relative 3 %   Lymphs Abs 0.7 0.7 - 4.0 K/uL   Monocytes Relative 10 %   Monocytes Absolute 2.1 (H) 0.1 - 1.0 K/uL   Eosinophils Relative 0 %   Eosinophils Absolute 0.0 0.0 - 0.5 K/uL   Basophils Relative 0 %   Basophils Absolute 0.0 0.0 - 0.1 K/uL   Immature Granulocytes 1 %   Abs Immature Granulocytes 0.21 (H) 0.00 - 0.07 K/uL    Comment: Performed at [facility], [address] [place], [place] [address]  Comprehensive metabolic panel     Status: Abnormal   Collection Time: 07/12/24 11:25 AM  Result Value Ref Range   Sodium 136 135 - 145 mmol/L   Potassium 3.1 (L) 3.5 - 5.1 mmol/L   Chloride 97 (L) 98 - 111 mmol/L   CO2 21 (L) 22 - 32 mmol/L   Glucose, Bld 114 (H) 70 - 99 mg/dL    Comment: Glucose reference range applies only to samples taken after fasting for at least 8 hours.   BUN 17 8 - 23 mg/dL   Creatinine, Ser 9.29 0.44 - 1.00 mg/dL   Calcium  9.5 8.9 - 10.3 mg/dL   Total Protein 7.8 6.5 - 8.1 g/dL   Albumin 3.8 3.5 - 5.0 g/dL   AST 26 15 - 41 U/L   ALT 18 0 - 44 U/L   Alkaline Phosphatase 113 38 - 126 U/L   Total Bilirubin 1.0 0.0 - 1.2 mg/dL   GFR, Estimated >39 >39 mL/min    Comment: (NOTE) Calculated using the CKD-EPI Creatinine Equation (2021)    Anion gap 18 (H) 5 - 15    Comment: Performed at [facility], [address], [place], [place] [address]  Lipase, blood  Status: None   Collection Time: 07/12/24 11:25 AM  Result Value Ref Range   Lipase 24 11 - 51 U/L    Comment: Performed at [facility], [address], [place], [place] [address]  Magnesium      Status: None   Collection Time: 07/12/24 11:25 AM  Result Value Ref Range   Magnesium  2.0 1.7 - 2.4 mg/dL    Comment: Performed at [facility], [address], [place], [place] [address]  Urinalysis, Routine w reflex microscopic -Urine, Clean Catch     Status: Abnormal   Collection Time: 07/12/24  2:48 PM   Result Value Ref Range   Color, Urine YELLOW YELLOW   APPearance CLEAR CLEAR   Specific Gravity, Urine >1.046 (H) 1.005 - 1.030   pH 6.5 5.0 - 8.0   Glucose, UA NEGATIVE NEGATIVE mg/dL   Hgb urine dipstick TRACE (A) NEGATIVE   Bilirubin Urine NEGATIVE NEGATIVE   Ketones, ur 40 (A) NEGATIVE mg/dL   Protein, ur 30 (A) NEGATIVE mg/dL   Nitrite NEGATIVE NEGATIVE   Leukocytes,Ua NEGATIVE NEGATIVE   RBC / HPF 0-5 0 - 5 RBC/hpf   WBC, UA 0-5 0 - 5 WBC/hpf   Bacteria, UA NONE SEEN NONE SEEN   Squamous Epithelial / HPF 0-5 0 - 5 /HPF   Mucus PRESENT     Comment: Performed at [facility], [address], [place], [place] [address]  Urine Culture     Status: None   Collection Time: 07/12/24  2:48 PM   Specimen: Urine, Catheterized  Result Value Ref Range   Specimen Description      URINE, CATHETERIZED Performed at [facility], [address] [place], [place] [address]    Special Requests      NONE Performed at [facility], [address], [place], [place] [address]    Culture      NO GROWTH Performed at [facility], [address], [place], [place] [address]    Report Status 07/13/2024 FINAL   Lactic acid, plasma     Status: None   Collection Time: 07/12/24  3:40 PM  Result Value Ref Range   Lactic Acid, Venous 0.9 0.5 - 1.9 mmol/L    Comment: Performed at [facility], [address] [place], [place] [address]  Resp panel by RT-PCR (RSV, Flu A&B, Covid) Anterior Nasal Swab     Status: None   Collection Time: 07/12/24  3:53 PM   Specimen: Anterior Nasal Swab  Result Value Ref Range   SARS Coronavirus 2 by RT PCR NEGATIVE NEGATIVE    Comment: (NOTE) SARS-CoV-2 target nucleic acids are NOT DETECTED.  The SARS-CoV-2 RNA is generally detectable in upper respiratory specimens during the acute phase of infection. The lowest concentration of SARS-CoV-2 viral copies this assay can detect  is 138 copies/mL. A negative result does not preclude SARS-Cov-2 infection and should not be used as the sole basis for treatment or other patient management decisions. A negative result may occur with  improper specimen collection/handling, submission of specimen other than nasopharyngeal swab, presence of viral mutation(s) within the areas targeted by this assay, and inadequate number of viral copies(<138 copies/mL). A negative result must be combined with clinical observations, patient history, and epidemiological information. The expected result is Negative.  Fact Sheet for Patients:  BloggerCourse.com  Fact Sheet for Healthcare Providers:  SeriousBroker.it  This test is no t yet approved or cleared by the [place]  FDA and  has been authorized for detection and/or diagnosis of SARS-CoV-2 by FDA under an Emergency Use Authorization (EUA). This EUA will remain  in effect (meaning this test can be used) for the duration of the COVID-19 declaration under Section 564(b)(1) of the Act, 21 U.S.C.section 360bbb-3(b)(1), unless the authorization is terminated  or revoked sooner.       Influenza A by PCR NEGATIVE NEGATIVE   Influenza B by PCR NEGATIVE NEGATIVE    Comment: (NOTE) The Xpert Xpress SARS-CoV-2/FLU/RSV plus assay is intended as an aid in the diagnosis of influenza from Nasopharyngeal swab specimens and should not be used as a sole basis for treatment. Nasal washings and aspirates are unacceptable for Xpert Xpress SARS-CoV-2/FLU/RSV testing.  Fact Sheet for Patients: BloggerCourse.com  Fact Sheet for Healthcare Providers: SeriousBroker.it  This test is not yet approved or cleared by the [place]  FDA and has been authorized for detection and/or diagnosis of SARS-CoV-2 by FDA under an Emergency Use Authorization (EUA). This EUA will remain in effect (meaning this  test can be used) for the duration of the COVID-19 declaration under Section 564(b)(1) of the Act, 21 U.S.C. section 360bbb-3(b)(1), unless the authorization is terminated or revoked.     Resp Syncytial Virus by PCR NEGATIVE NEGATIVE    Comment: (NOTE) Fact Sheet for Patients: BloggerCourse.com  Fact Sheet for Healthcare Providers: SeriousBroker.it  This test is not yet approved or cleared by the [place]  FDA and has been authorized for detection and/or diagnosis of SARS-CoV-2 by FDA under an Emergency Use Authorization (EUA). This EUA will remain in effect (meaning this test can be used) for the duration of the COVID-19 declaration under Section 564(b)(1) of the Act, 21 U.S.C. section 360bbb-3(b)(1), unless the authorization is terminated or revoked.  Performed at [facility], [address], [place], [place] [address]   Blood culture (routine x 2)     Status: None   Collection Time: 07/12/24  4:30 PM   Specimen: BLOOD  Result Value Ref Range   Specimen Description      BLOOD LEFT ANTECUBITAL Performed at [facility], [address] [place], [place] [address]    Special Requests      BOTTLES DRAWN AEROBIC AND ANAEROBIC Blood Culture results may not be optimal due to an inadequate volume of blood received in culture bottles Performed at [facility], [address] [place], [place] [address]    Culture      NO GROWTH 5 DAYS Performed at [facility], [address], [place], [place] [address]    Report Status 07/17/2024 FINAL   Blood culture (routine x 2)     Status: None   Collection Time: 07/12/24  4:34 PM   Specimen: BLOOD  Result Value Ref Range   Specimen Description      BLOOD LEFT ANTECUBITAL Performed at [facility], [address], [place], [place] [address]    Special Requests      BOTTLES DRAWN AEROBIC AND  ANAEROBIC Blood Culture results may not be optimal due to an inadequate volume of blood received in culture bottles Performed at [facility], [address] [place], [place] [address]    Culture      NO GROWTH 5 DAYS Performed at [facility], [address] [place], [place] [address]    Report Status 07/17/2024 FINAL   Basic metabolic panel     Status: Abnormal   Collection Time: 07/13/24  5:22 AM  Result Value  Ref Range   Sodium 140 135 - 145 mmol/L   Potassium 4.3 3.5 - 5.1 mmol/L    Comment: Delta check noted    Chloride 104 98 - 111 mmol/L   CO2 21 (L) 22 - 32 mmol/L   Glucose, Bld 99 70 - 99 mg/dL    Comment: Glucose reference range applies only to samples taken after fasting for at least 8 hours.   BUN 13 8 - 23 mg/dL   Creatinine, Ser 9.41 0.44 - 1.00 mg/dL   Calcium  8.8 (L) 8.9 - 10.3 mg/dL   GFR, Estimated >39 >39 mL/min    Comment: (NOTE) Calculated using the CKD-EPI Creatinine Equation (2021)    Anion gap 15 5 - 15    Comment: Performed at [facility], [address], [place], [place] [address]  CBC     Status: Abnormal   Collection Time: 07/13/24  5:22 AM  Result Value Ref Range   WBC 19.0 (H) 4.0 - 10.5 K/uL   RBC 3.75 (L) 3.87 - 5.11 MIL/uL   Hemoglobin 11.2 (L) 12.0 - 15.0 g/dL   HCT 65.5 (L) 63.9 - 53.9 %   MCV 91.7 80.0 - 100.0 fL   MCH 29.9 26.0 - 34.0 pg   MCHC 32.6 30.0 - 36.0 g/dL   RDW 86.9 88.4 - 84.4 %   Platelets 511 (H) 150 - 400 K/uL   nRBC 0.0 0.0 - 0.2 %    Comment: Performed at [facility], [address], [place], [place] [address]  Basic metabolic panel     Status: Abnormal   Collection Time: 07/14/24  5:29 AM  Result Value Ref Range   Sodium 134 (L) 135 - 145 mmol/L   Potassium 3.6 3.5 - 5.1 mmol/L   Chloride 98 98 - 111 mmol/L   CO2 21 (L) 22 - 32 mmol/L   Glucose, Bld 93 70 - 99 mg/dL    Comment: Glucose reference range applies only to samples taken after fasting  for at least 8 hours.   BUN 10 8 - 23 mg/dL   Creatinine, Ser 9.35 0.44 - 1.00 mg/dL   Calcium  8.7 (L) 8.9 - 10.3 mg/dL   GFR, Estimated >39 >39 mL/min    Comment: (NOTE) Calculated using the CKD-EPI Creatinine Equation (2021)    Anion gap 15 5 - 15    Comment: Performed at [facility], [address], [place], [place] [address]  CBC     Status: Abnormal   Collection Time: 07/14/24  5:29 AM  Result Value Ref Range   WBC 18.1 (H) 4.0 - 10.5 K/uL   RBC 3.87 3.87 - 5.11 MIL/uL   Hemoglobin 11.1 (L) 12.0 - 15.0 g/dL   HCT 64.6 (L) 63.9 - 53.9 %   MCV 91.2 80.0 - 100.0 fL   MCH 28.7 26.0 - 34.0 pg   MCHC 31.4 30.0 - 36.0 g/dL   RDW 86.8 88.4 - 84.4 %   Platelets 550 (H) 150 - 400 K/uL   nRBC 0.0 0.0 - 0.2 %    Comment: Performed at [facility], [address] [place], [place] [address]  Basic metabolic panel     Status: Abnormal   Collection Time: 07/15/24  4:56 AM  Result Value Ref Range   Sodium 132 (L) 135 - 145 mmol/L   Potassium 3.8 3.5 - 5.1 mmol/L   Chloride 101 98 - 111 mmol/L   CO2 18 (L) 22 - 32  mmol/L   Glucose, Bld 93 70 - 99 mg/dL    Comment: Glucose reference range applies only to samples taken after fasting for at least 8 hours.   BUN 9 8 - 23 mg/dL   Creatinine, Ser 9.41 0.44 - 1.00 mg/dL   Calcium  7.8 (L) 8.9 - 10.3 mg/dL   GFR, Estimated >39 >39 mL/min    Comment: (NOTE) Calculated using the CKD-EPI Creatinine Equation (2021)    Anion gap 13 5 - 15    Comment: Performed at [facility], [address] [place], [place] [address]  CBC     Status: Abnormal   Collection Time: 07/15/24  4:56 AM  Result Value Ref Range   WBC 14.2 (H) 4.0 - 10.5 K/uL   RBC 3.27 (L) 3.87 - 5.11 MIL/uL   Hemoglobin 9.7 (L) 12.0 - 15.0 g/dL   HCT 69.7 (L) 63.9 - 53.9 %   MCV 92.4 80.0 - 100.0 fL   MCH 29.7 26.0 - 34.0 pg   MCHC 32.1 30.0 - 36.0 g/dL   RDW 86.7 88.4 - 84.4 %   Platelets 519 (H) 150 - 400 K/uL   nRBC  0.0 0.0 - 0.2 %    Comment: Performed at [facility], [address], [place], [place] [address]  Magnesium      Status: None   Collection Time: 07/15/24  4:56 AM  Result Value Ref Range   Magnesium  2.1 1.7 - 2.4 mg/dL    Comment: Performed at [facility], [address], [place], [place] [address]  Uric acid     Status: None   Collection Time: 07/15/24  4:56 AM  Result Value Ref Range   Uric Acid, Serum 2.8 2.5 - 7.1 mg/dL    Comment: Performed at [facility], [address] [place], [place] [address]  CBC     Status: Abnormal   Collection Time: 07/16/24  7:47 AM  Result Value Ref Range   WBC 17.0 (H) 4.0 - 10.5 K/uL   RBC 3.79 (L) 3.87 - 5.11 MIL/uL   Hemoglobin 11.0 (L) 12.0 - 15.0 g/dL   HCT 65.7 (L) 63.9 - 53.9 %   MCV 90.2 80.0 - 100.0 fL   MCH 29.0 26.0 - 34.0 pg   MCHC 32.2 30.0 - 36.0 g/dL   RDW 86.6 88.4 - 84.4 %   Platelets 647 (H) 150 - 400 K/uL   nRBC 0.0 0.0 - 0.2 %    Comment: Performed at [facility], [address], [place], [place] [address]  Basic metabolic panel with GFR     Status: Abnormal   Collection Time: 07/16/24  7:47 AM  Result Value Ref Range   Sodium 135 135 - 145 mmol/L   Potassium 3.3 (L) 3.5 - 5.1 mmol/L   Chloride 100 98 - 111 mmol/L   CO2 20 (L) 22 - 32 mmol/L   Glucose, Bld 92 70 - 99 mg/dL    Comment: Glucose reference range applies only to samples taken after fasting for at least 8 hours.   BUN 7 (L) 8 - 23 mg/dL   Creatinine, Ser 9.54 0.44 - 1.00 mg/dL   Calcium  8.1 (L) 8.9 - 10.3 mg/dL   GFR, Estimated >39 >39 mL/min    Comment: (NOTE) Calculated using the CKD-EPI Creatinine Equation (2021)    Anion gap 15 5 - 15    Comment: Performed at [facility], [address] [place], [place]  [address]  Magnesium      Status: None   Collection Time: 07/16/24  7:47 AM  Result Value Ref Range   Magnesium  2.1 1.7 - 2.4 mg/dL    Comment:  Performed at [facility], [address], [place], [place] [address]  Vitamin B12     Status: Abnormal   Collection Time: 07/16/24  7:47 AM  Result Value Ref Range   Vitamin B-12 1,621 (H) 180 - 914 pg/mL    Comment: Performed at [facility], [address] [place], [place] [address]  Folate     Status: Abnormal   Collection Time: 07/16/24  7:47 AM  Result Value Ref Range   Folate 3.6 (L) >5.9 ng/mL    Comment: Performed at [facility], [address], [place], [place] [address]  Iron and TIBC     Status: Abnormal   Collection Time: 07/16/24  7:47 AM  Result Value Ref Range   Iron 11 (L) 28 - 170 ug/dL   TIBC 814 (L) 749 - 549 ug/dL   Saturation Ratios 6 (L) 10.4 - 31.8 %   UIBC 174 ug/dL    Comment: Performed at [facility], [address], [place], [place] [address]  Reticulocytes     Status: Abnormal   Collection Time: 07/16/24  7:47 AM  Result Value Ref Range   Retic Ct Pct 0.8 0.4 - 3.1 %   RBC. 3.68 (L) 3.87 - 5.11 MIL/uL   Retic Count, Absolute 28.3 19.0 - 186.0 K/uL   Immature Retic Fract 19.9 (H) 2.3 - 15.9 %    Comment: Performed at [facility], [address] [place], [place] [address]  Ferritin     Status: Abnormal   Collection Time: 07/16/24  9:01 AM  Result Value Ref Range   Ferritin 379 (H) 11 - 307 ng/mL    Comment: Performed at [facility], [address] [place], [place] [address]  Basic metabolic panel     Status: Abnormal   Collection Time: 07/17/24  7:43 AM  Result Value Ref Range   Sodium 138 135 - 145 mmol/L    Comment: Delta check noted    Potassium 4.0 3.5 - 5.1 mmol/L    Comment: Delta check noted    Chloride 107 98 - 111 mmol/L   CO2 19 (L) 22 - 32 mmol/L   Glucose, Bld 100 (H) 70 - 99 mg/dL    Comment: Glucose reference range applies only to samples taken after fasting for at least 8 hours.   BUN 7 (L) 8 - 23 mg/dL   Creatinine, Ser 9.42  0.44 - 1.00 mg/dL    Comment: Delta check noted    Calcium  7.8 (L) 8.9 - 10.3 mg/dL   GFR, Estimated >39 >39 mL/min    Comment: (NOTE) Calculated using the CKD-EPI Creatinine Equation (2021)    Anion gap 11 5 - 15    Comment: Performed at [facility], [address], [place], [place] [address]  Basic metabolic panel with GFR     Status: Abnormal   Collection Time: 07/18/24  4:42 AM  Result Value Ref Range   Sodium 142 135 - 145 mmol/L   Potassium 3.2 (L) 3.5 - 5.1 mmol/L    Comment: Delta check noted    Chloride 109 98 - 111 mmol/L   CO2 20 (L) 22 - 32 mmol/L   Glucose, Bld 87 70 - 99 mg/dL  Comment: Glucose reference range applies only to samples taken after fasting for at least 8 hours.   BUN 7 (L) 8 - 23 mg/dL   Creatinine, Ser 9.47 0.44 - 1.00 mg/dL   Calcium  8.2 (L) 8.9 - 10.3 mg/dL   GFR, Estimated >39 >39 mL/min    Comment: (NOTE) Calculated using the CKD-EPI Creatinine Equation (2021)    Anion gap 14 5 - 15    Comment: Performed at [facility], [address], [place], [place] [address]  Basic metabolic panel with GFR     Status: None   Collection Time: 07/25/24  2:34 PM  Result Value Ref Range   Sodium 137 135 - 145 mEq/L   Potassium 3.9 3.5 - 5.1 mEq/L   Chloride 101 96 - 112 mEq/L   CO2 26 19 - 32 mEq/L   Glucose, Bld 97 70 - 99 mg/dL   BUN 10 6 - 23 mg/dL   Creatinine, Ser 9.22 0.40 - 1.20 mg/dL   GFR 26.47 >39.99 mL/min    Comment: Calculated using the CKD-EPI Creatinine Equation (2021)   Calcium  9.9 8.4 - 10.5 mg/dL  CBC with Differential/Platelet     Status: Abnormal   Collection Time: 07/25/24  2:34 PM  Result Value Ref Range   WBC 11.7 (H) 4.0 - 10.5 K/uL   RBC 3.72 (L) 3.87 - 5.11 Mil/uL   Hemoglobin 10.8 (L) 12.0 - 15.0 g/dL   HCT 67.4 (L) 63.9 - 53.9 %   MCV 87.4 78.0 - 100.0 fl   MCHC 33.2 30.0 - 36.0 g/dL   RDW 85.9 88.4 - 84.4 %   Platelets 604.0 (H) 150.0 - 400.0 K/uL   Neutrophils Relative % 77.1  (H) 43.0 - 77.0 %   Lymphocytes Relative 11.4 (L) 12.0 - 46.0 %   Monocytes Relative 9.8 3.0 - 12.0 %   Eosinophils Relative 0.4 0.0 - 5.0 %   Basophils Relative 1.3 0.0 - 3.0 %   Neutro Abs 9.0 (H) 1.4 - 7.7 K/uL   Lymphs Abs 1.3 0.7 - 4.0 K/uL   Monocytes Absolute 1.1 (H) 0.1 - 1.0 K/uL   Eosinophils Absolute 0.0 0.0 - 0.7 K/uL   Basophils Absolute 0.2 (H) 0.0 - 0.1 K/uL  Urinalysis w microscopic + reflex cultur     Status: None   Collection Time: 07/25/24  2:34 PM   Specimen: Blood  Result Value Ref Range   Color, Urine YELLOW YELLOW   APPearance CLEAR CLEAR   Specific Gravity, Urine 1.009 1.001 - 1.035   pH 7.0 5.0 - 8.0   Glucose, UA NEGATIVE NEGATIVE   Bilirubin Urine NEGATIVE NEGATIVE   Ketones, ur NEGATIVE NEGATIVE   Hgb urine dipstick NEGATIVE NEGATIVE   Protein, ur NEGATIVE NEGATIVE   Nitrites, Initial NEGATIVE NEGATIVE   Leukocyte Esterase NEGATIVE NEGATIVE   WBC, UA NONE SEEN 0 - 5 /HPF   RBC / HPF NONE SEEN 0 - 2 /HPF   Squamous Epithelial / HPF 0-5 < OR = 5 /HPF   Bacteria, UA NONE SEEN NONE SEEN /HPF   Hyaline Cast NONE SEEN NONE SEEN /LPF   Note      Comment: This urine was analyzed for the presence of WBC,  RBC, bacteria, casts, and other formed elements.  Only those elements seen were reported. . .   REFLEXIVE URINE CULTURE     Status: None   Collection Time: 07/25/24  2:34 PM  Result Value Ref Range   Reflexve Urine Culture  Comment: NO CULTURE INDICATED    RADIOGRAPHIC STUDIES: I have personally reviewed the radiological images as listed and agreed with the findings in the report. MM 3D SCREENING MAMMOGRAM BILATERAL BREAST Result Date: 08/15/2024 CLINICAL DATA:  Screening. EXAM: DIGITAL SCREENING BILATERAL MAMMOGRAM WITH TOMOSYNTHESIS AND CAD TECHNIQUE: Bilateral screening digital craniocaudal and mediolateral oblique mammograms were obtained. Bilateral screening digital breast tomosynthesis was performed. The images were evaluated with  computer-aided detection. COMPARISON:  Previous exam(s). ACR Breast Density Category b: There are scattered areas of fibroglandular density. FINDINGS: There are no findings suspicious for malignancy. IMPRESSION: No mammographic evidence of malignancy. A result letter of this screening mammogram will be mailed directly to the patient. RECOMMENDATION: Screening mammogram in one year. (Code:[id number]-B-01Y) BI-RADS CATEGORY  1: Negative. Electronically Signed   By: Dina  Arceo M.D.   On: 08/15/2024 11:15

## 2024-08-18 ENCOUNTER — Inpatient Hospital Stay

## 2024-08-18 ENCOUNTER — Ambulatory Visit: Payer: Self-pay

## 2024-08-18 VITALS — BP 146/82 | HR 67 | Temp 97.2°F | Resp 18 | Wt 114.0 lb

## 2024-08-18 DIAGNOSIS — D72829 Elevated white blood cell count, unspecified: Secondary | ICD-10-CM | POA: Insufficient documentation

## 2024-08-18 DIAGNOSIS — Z803 Family history of malignant neoplasm of breast: Secondary | ICD-10-CM

## 2024-08-18 DIAGNOSIS — D75839 Thrombocytosis, unspecified: Secondary | ICD-10-CM | POA: Insufficient documentation

## 2024-08-18 DIAGNOSIS — D649 Anemia, unspecified: Secondary | ICD-10-CM | POA: Diagnosis not present

## 2024-08-18 DIAGNOSIS — E538 Deficiency of other specified B group vitamins: Secondary | ICD-10-CM

## 2024-08-18 LAB — CBC WITH DIFFERENTIAL (CANCER CENTER ONLY)
Abs Immature Granulocytes: 0.02 K/uL (ref 0.00–0.07)
Basophils Absolute: 0.1 K/uL (ref 0.0–0.1)
Basophils Relative: 1 %
Eosinophils Absolute: 0.2 K/uL (ref 0.0–0.5)
Eosinophils Relative: 2 %
HCT: 37.4 % (ref 36.0–46.0)
Hemoglobin: 12.3 g/dL (ref 12.0–15.0)
Immature Granulocytes: 0 %
Lymphocytes Relative: 21 %
Lymphs Abs: 1.8 K/uL (ref 0.7–4.0)
MCH: 29.6 pg (ref 26.0–34.0)
MCHC: 32.9 g/dL (ref 30.0–36.0)
MCV: 90.1 fL (ref 80.0–100.0)
Monocytes Absolute: 0.9 K/uL (ref 0.1–1.0)
Monocytes Relative: 11 %
Neutro Abs: 5.5 K/uL (ref 1.7–7.7)
Neutrophils Relative %: 65 %
Platelet Count: 303 K/uL (ref 150–400)
RBC: 4.15 MIL/uL (ref 3.87–5.11)
RDW: 15.7 % — ABNORMAL HIGH (ref 11.5–15.5)
WBC Count: 8.6 K/uL (ref 4.0–10.5)
nRBC: 0 % (ref 0.0–0.2)

## 2024-08-18 LAB — FOLATE: Folate: 15.2 ng/mL (ref >5.9)

## 2024-08-18 LAB — HEPATIC FUNCTION PANEL
ALT: 14 U/L (ref 0–44)
AST: 19 U/L (ref 15–41)
Albumin: 4.6 g/dL (ref 3.5–5.0)
Alkaline Phosphatase: 65 U/L (ref 38–126)
Bilirubin, Direct: 0.2 mg/dL (ref 0.0–0.2)
Indirect Bilirubin: 0.3 mg/dL (ref 0.3–0.9)
Total Bilirubin: 0.5 mg/dL (ref 0.0–1.2)
Total Protein: 6.9 g/dL (ref 6.5–8.1)

## 2024-08-18 LAB — IRON AND IRON BINDING CAPACITY (CC-WL,HP ONLY)
Iron: 77 ug/dL (ref 28–170)
Saturation Ratios: 19 % (ref 10.4–31.8)
TIBC: 400 ug/dL (ref 250–450)
UIBC: 323 ug/dL (ref 148–442)

## 2024-08-18 LAB — LACTATE DEHYDROGENASE: LDH: 151 U/L (ref 98–192)

## 2024-08-18 LAB — DIRECT ANTIGLOBULIN TEST (NOT AT ARMC)
DAT, IgG: NEGATIVE
DAT, complement: NEGATIVE

## 2024-08-18 LAB — FERRITIN: Ferritin: 113 ng/mL (ref 11–307)

## 2024-08-18 NOTE — Assessment & Plan Note (Addendum)
 Continue folic acid 1 mg daily.

## 2024-08-18 NOTE — Assessment & Plan Note (Addendum)
 Will repeat iron study now assuming not having acute inflammatory process LFT.  LDH, ferritin, DAT

## 2024-08-18 NOTE — Assessment & Plan Note (Addendum)
 Repeat CBC.

## 2024-08-18 NOTE — Assessment & Plan Note (Addendum)
Will repeat CBC today

## 2024-08-23 ENCOUNTER — Other Ambulatory Visit (HOSPITAL_COMMUNITY): Payer: Self-pay

## 2024-08-23 ENCOUNTER — Other Ambulatory Visit: Payer: Self-pay

## 2024-08-23 NOTE — Progress Notes (Signed)
 Specialty Pharmacy Refill Coordination Note  Patricia Peck is a 78 y.o. female contacted today regarding refills of specialty medication(s) Denosumab  (Prolia )   Patient requested Courier to Provider Office   Delivery date: 08/25/24   Verified address: [facility] at [address]   Medication will be filled on 10/22.

## 2024-08-24 ENCOUNTER — Ambulatory Visit (INDEPENDENT_AMBULATORY_CARE_PROVIDER_SITE_OTHER): Admitting: Nurse Practitioner

## 2024-08-24 ENCOUNTER — Encounter: Payer: Self-pay | Admitting: Nurse Practitioner

## 2024-08-24 VITALS — BP 124/78 | HR 74 | Temp 97.6°F | Ht 64.0 in | Wt 114.2 lb

## 2024-08-24 DIAGNOSIS — N390 Urinary tract infection, site not specified: Secondary | ICD-10-CM | POA: Diagnosis not present

## 2024-08-24 DIAGNOSIS — K219 Gastro-esophageal reflux disease without esophagitis: Secondary | ICD-10-CM | POA: Diagnosis not present

## 2024-08-24 DIAGNOSIS — E038 Other specified hypothyroidism: Secondary | ICD-10-CM | POA: Diagnosis not present

## 2024-08-24 DIAGNOSIS — K5909 Other constipation: Secondary | ICD-10-CM

## 2024-08-24 DIAGNOSIS — D72829 Elevated white blood cell count, unspecified: Secondary | ICD-10-CM | POA: Diagnosis not present

## 2024-08-24 DIAGNOSIS — I10 Essential (primary) hypertension: Secondary | ICD-10-CM | POA: Diagnosis not present

## 2024-08-24 DIAGNOSIS — N1832 Chronic kidney disease, stage 3b: Secondary | ICD-10-CM | POA: Diagnosis not present

## 2024-08-24 DIAGNOSIS — E538 Deficiency of other specified B group vitamins: Secondary | ICD-10-CM

## 2024-08-24 DIAGNOSIS — D649 Anemia, unspecified: Secondary | ICD-10-CM | POA: Diagnosis not present

## 2024-08-24 MED ORDER — FAMOTIDINE 40 MG PO TABS: 40.0000 mg | ORAL_TABLET | Freq: Every day | ORAL | 1 refills | Status: AC

## 2024-08-24 MED ORDER — PANTOPRAZOLE SODIUM 40 MG PO TBEC
40.0000 mg | DELAYED_RELEASE_TABLET | Freq: Two times a day (BID) | ORAL | 1 refills | Status: AC
Start: 2024-08-24 — End: ?

## 2024-08-24 MED ORDER — FOLIC ACID 1 MG PO TABS: 1.0000 mg | ORAL_TABLET | Freq: Every day | ORAL | 3 refills | Status: AC

## 2024-08-24 NOTE — Assessment & Plan Note (Signed)
 Improved with famotidine  40mg  daily and protonix  40mg  BID She had eval by GI-no additional changes recommended.

## 2024-08-24 NOTE — Patient Instructions (Signed)
 Ok to only monitor BP once a week. Call office if BP >140/90 for more than 2days. Schedule lab appointment in 5months. Maintain current med doses

## 2024-08-24 NOTE — Progress Notes (Signed)
 Established Patient Visit  Patient: Patricia Peck   DOB: August 10, 1946   78 y.o. Female  MRN: [id number] Visit Date: 08/24/2024  Subjective:    Chief Complaint  Patient presents with   Follow-up    4 week follow up for Anemia and constipation  Discuss having Pantoprazole  and Restoril  as a 90 day and not 30 day supply    HPI Accompanied by [name]  Esophageal reflux Improved with famotidine  40mg  daily and protonix  40mg  BID She had eval by GI-no additional changes recommended.  Hypothyroidism Repeat Tsh and T4 in 5months Maintain current med dose  Normocytic anemia Repeat CBC and folate in 6months. She had eval by hematology Current use of folic acid   Leukocytosis Repeat cbc in 5months  Essential hypertension Verified Home BP machine 121/84, HR 75 Home Bp reading 110s-130s/80s BP Readings from Last 3 Encounters:  08/24/24 124/78  08/18/24 (!) 146/82  08/16/24 124/84    Maintain amlodipine  and metoprolol  doses  Chronic constipation Improved with Use of miralax  daily and sennokot prn.  Recurrent UTI Associated with Urinary incontinence: [facility]  Reviewed medical, surgical, and social history today  Medications: Outpatient Medications Prior to Visit  Medication Sig   amLODipine  (NORVASC ) 5 MG tablet Take 1 tablet (5 mg total) by mouth every evening.   atorvastatin  (LIPITOR) 80 MG tablet Take 1 tablet (80 mg total) by mouth daily.   Benzocaine -Resorcinol (ANTI-ITCH VAGINAL) 20-3 % CREA Apply 1 application  topically 2 (two) times daily as needed (irritation, itching).   bumetanide  (BUMEX ) 1 MG tablet Take 1 tablet (1 mg total) by mouth daily as needed. For leg and ankle swelling. Take with potassium chloride  10mEq   Calcium  Carb-Cholecalciferol (CALCIUM  + VITAMIN D3 PO) Take 1 tablet by mouth 2 (two) times daily.   denosumab  (PROLIA ) 60 MG/ML SOLN injection Inject 60 mg into the skin once. Administer in upper  arm, thigh, or abdomen   denosumab  (PROLIA ) 60 MG/ML SOSY injection Inject 60 mg into the skin once for 1 dose.   estradiol (ESTRACE) 0.1 MG/GM vaginal cream Place 1 Applicatorful vaginally 3 (three) times a week.   ezetimibe  (ZETIA ) 10 MG tablet TAKE 1 TABLET(10 MG) BY MOUTH DAILY   levothyroxine  (SYNTHROID ) 50 MCG tablet Take 1 tablet (50 mcg total) by mouth daily before breakfast.   loratadine  (CLARITIN ) 10 MG tablet Take 10 mg by mouth daily.   methocarbamol  (ROBAXIN ) 500 MG tablet Take 1 tablet (500 mg total) by mouth every 8 (eight) hours as needed for muscle spasms.   metoprolol  succinate (TOPROL -XL) 25 MG 24 hr tablet Take 3 tablets (75 mg total) by mouth daily. Take with or immediately following a meal.   montelukast  (SINGULAIR ) 10 MG tablet TAKE 1 TABLET(10 MG) BY MOUTH AT BEDTIME   nitrofurantoin  (MACRODANTIN ) 100 MG capsule Take 100 mg by mouth daily.   polyethylene glycol powder (GLYCOLAX /MIRALAX ) 17 GM/SCOOP powder Take 17 g by mouth daily. Dissolve 1 capful (17g) in 4-8 ounces of liquid and take by mouth daily.   potassium chloride  (KLOR-CON ) 10 MEQ tablet Take 1 tablet (10 mEq total) by mouth daily as needed. For leg and ankle swelling. Take with bumex  1mg    senna-docusate (SENOKOT-S) 8.6-50 MG tablet Take 2 tablets by mouth at bedtime.   temazepam  (RESTORIL ) 7.5 MG capsule TAKE 1 CAPSULE(7.5 MG) BY MOUTH AT BEDTIME   XARELTO  15 MG TABS tablet TAKE 1 TABLET(15 MG)  BY MOUTH DAILY WITH SUPPER   [DISCONTINUED] famotidine  (PEPCID ) 40 MG tablet TAKE 1 TABLET(40 MG) BY MOUTH AT BEDTIME   [DISCONTINUED] folic acid  (FOLVITE ) 1 MG tablet Take 1 tablet (1 mg total) by mouth daily.   [DISCONTINUED] pantoprazole  (PROTONIX ) 40 MG tablet Take 1 tablet (40 mg total) by mouth 2 (two) times daily before a meal.   No facility-administered medications prior to visit.   Reviewed past medical and social history.   ROS per HPI above      Objective:  BP 124/78 (BP Location: Left Arm, Patient  Position: Sitting, Cuff Size: Normal)   Pulse 74   Temp 97.6 F (36.4 C) (Oral)   Ht 5' 4 (1.626 m)   Wt 114 lb 3.2 oz (51.8 kg)   SpO2 98%   BMI 19.60 kg/m      Physical Exam Vitals and nursing note reviewed.  Cardiovascular:     Rate and Rhythm: Normal rate. Rhythm irregular.     Pulses: Normal pulses.     Heart sounds: Normal heart sounds.  Pulmonary:     Effort: Pulmonary effort is normal.     Breath sounds: Normal breath sounds.  Abdominal:     General: Bowel sounds are normal. There is no distension.     Palpations: Abdomen is soft.     Tenderness: There is no abdominal tenderness. There is no guarding.  Musculoskeletal:     Right lower leg: No edema.     Left lower leg: No edema.  Neurological:     Mental Status: She is alert and oriented to person, place, and time.  Psychiatric:        Mood and Affect: Mood normal.        Behavior: Behavior normal.        Thought Content: Thought content normal.     No results found for any visits on 08/24/24.    Assessment & Plan:    Problem List Items Addressed This Visit     Chronic constipation   Improved with Use of miralax  daily and sennokot prn.      Esophageal reflux - Primary   Improved with famotidine  40mg  daily and protonix  40mg  BID She had eval by GI-no additional changes recommended.      Relevant Medications   pantoprazole  (PROTONIX ) 40 MG tablet   famotidine  (PEPCID ) 40 MG tablet   Essential hypertension   Verified Home BP machine 121/84, HR 75 Home Bp reading 110s-130s/80s BP Readings from Last 3 Encounters:  08/24/24 124/78  08/18/24 (!) 146/82  08/16/24 124/84    Maintain amlodipine  and metoprolol  doses      Folate deficiency   Relevant Medications   folic acid  (FOLVITE ) 1 MG tablet   Other Relevant Orders   Folate   Hypothyroidism   Repeat Tsh and T4 in 5months Maintain current med dose      Relevant Orders   TSH   T4, free   Leukocytosis   Repeat cbc in 5months       Relevant Orders   CBC with Differential/Platelet   Normocytic anemia   Repeat CBC and folate in 6months. She had eval by hematology Current use of folic acid       Relevant Medications   folic acid  (FOLVITE ) 1 MG tablet   Other Relevant Orders   CBC with Differential/Platelet   Recurrent UTI   Associated with Urinary incontinence: [facility] appointment [facility]      Other Visit Diagnoses  Stage 3b chronic kidney disease (HCC)          Return in about 6 months (around 02/22/2025) for HTN, Hypothyroidism, anemia, hyperlipidemia (fasting).     Roselie Mood, NP

## 2024-08-24 NOTE — Assessment & Plan Note (Signed)
 Improved with Use of miralax  daily and sennokot prn.

## 2024-08-24 NOTE — Assessment & Plan Note (Signed)
 Repeat cbc in 5months

## 2024-08-24 NOTE — Assessment & Plan Note (Signed)
 Repeat Tsh and T4 in 5months Maintain current med dose

## 2024-08-24 NOTE — Assessment & Plan Note (Signed)
 Associated with Urinary incontinence: has upcoming appointment [facility]

## 2024-08-24 NOTE — Assessment & Plan Note (Signed)
 Repeat CBC and folate in 6months. She had eval by hematology Current use of folic acid 

## 2024-08-24 NOTE — Assessment & Plan Note (Signed)
 Verified Home BP machine 121/84, HR 75 Home Bp reading 110s-130s/80s BP Readings from Last 3 Encounters:  08/24/24 124/78  08/18/24 (!) 146/82  08/16/24 124/84    Maintain amlodipine  and metoprolol  doses

## 2024-08-25 ENCOUNTER — Inpatient Hospital Stay

## 2024-08-25 ENCOUNTER — Telehealth: Payer: Self-pay

## 2024-08-25 NOTE — Telephone Encounter (Signed)
 Called patient and informed her that the Prolia  has arrived to our office and to get her scheduled for a nurse visit. Patient scheduled for 08/26/24 at 3:40 PM

## 2024-08-26 ENCOUNTER — Ambulatory Visit

## 2024-08-26 DIAGNOSIS — M810 Age-related osteoporosis without current pathological fracture: Secondary | ICD-10-CM | POA: Diagnosis not present

## 2024-08-26 MED ORDER — DENOSUMAB 60 MG/ML ~~LOC~~ SOSY
60.0000 mg | PREFILLED_SYRINGE | SUBCUTANEOUS | Status: AC
  Administered 2024-08-26: 60 mg via SUBCUTANEOUS

## 2024-08-26 NOTE — Progress Notes (Signed)
 Patient is in office today for a nurse visit for Prolia  . Patient Injection was given in the  Right arm. Patient tolerated injection well. Next appointment is 02/27/25.

## 2024-08-31 DIAGNOSIS — R399 Unspecified symptoms and signs involving the genitourinary system: Secondary | ICD-10-CM | POA: Diagnosis not present

## 2024-08-31 DIAGNOSIS — N3946 Mixed incontinence: Secondary | ICD-10-CM | POA: Diagnosis not present

## 2024-08-31 DIAGNOSIS — N390 Urinary tract infection, site not specified: Secondary | ICD-10-CM | POA: Diagnosis not present

## 2024-08-31 DIAGNOSIS — R350 Frequency of micturition: Secondary | ICD-10-CM | POA: Diagnosis not present

## 2024-08-31 DIAGNOSIS — R351 Nocturia: Secondary | ICD-10-CM | POA: Diagnosis not present

## 2024-09-05 ENCOUNTER — Encounter: Payer: Self-pay | Admitting: Radiology

## 2024-09-05 ENCOUNTER — Encounter: Payer: Self-pay | Admitting: Nurse Practitioner

## 2024-10-03 NOTE — Telephone Encounter (Signed)
 Called patient to ask if she was having any symptoms such as a headache, blurred vision and/or any chest pain or discomfort. I also asked if she took this BP after eating and/or drinking anything with caffeine. She stated that she does not have any symptoms and she drank some tea to help her have a BM. I asked her to retake her blood pressure for me  that reading was 137/100. I informed her that I will pass this information along to [facility] and she will hear back from me via MyChart or phone call.

## 2024-10-05 NOTE — Telephone Encounter (Signed)
 Patient called and informed of provider comments. She thanked me for calling and all (if any) questions were answered.

## 2024-10-06 DIAGNOSIS — Z01419 Encounter for gynecological examination (general) (routine) without abnormal findings: Secondary | ICD-10-CM | POA: Diagnosis not present

## 2024-10-06 DIAGNOSIS — Z6821 Body mass index (BMI) 21.0-21.9, adult: Secondary | ICD-10-CM | POA: Diagnosis not present

## 2024-10-06 DIAGNOSIS — N952 Postmenopausal atrophic vaginitis: Secondary | ICD-10-CM | POA: Diagnosis not present

## 2024-10-14 DIAGNOSIS — N3281 Overactive bladder: Secondary | ICD-10-CM | POA: Diagnosis not present

## 2024-10-14 DIAGNOSIS — N390 Urinary tract infection, site not specified: Secondary | ICD-10-CM | POA: Diagnosis not present

## 2024-10-14 DIAGNOSIS — R350 Frequency of micturition: Secondary | ICD-10-CM | POA: Diagnosis not present

## 2024-10-24 ENCOUNTER — Ambulatory Visit (INDEPENDENT_AMBULATORY_CARE_PROVIDER_SITE_OTHER): Payer: Medicare PPO

## 2024-10-24 VITALS — Ht 63.0 in | Wt 114.0 lb

## 2024-10-24 DIAGNOSIS — Z0000 Encounter for general adult medical examination without abnormal findings: Secondary | ICD-10-CM | POA: Diagnosis not present

## 2024-10-24 NOTE — Patient Instructions (Signed)
 Patricia Peck,  Thank you for taking the time for your Medicare Wellness Visit. I appreciate your continued commitment to your health goals. Please review the care plan we discussed, and feel free to reach out if I can assist you further.  Please note that Annual Wellness Visits do not include a physical exam. Some assessments may be limited, especially if the visit was conducted virtually. If needed, we may recommend an in-person follow-up with your provider.  Ongoing Care Seeing your primary care provider every 3 to 6 months helps us  monitor your health and provide consistent, personalized care.   Referrals If a referral was made during today's visit and you haven't received any updates within two weeks, please contact the referred provider directly to check on the status.  Recommended Screenings:  Health Maintenance  Topic Date Due   COVID-19 Vaccine (10 - 2025-26 season) 07/04/2024   Medicare Annual Wellness Visit  10/18/2024   DTaP/Tdap/Td vaccine (3 - Tdap) 04/16/2025   Breast Cancer Screening  08/11/2025   Pneumococcal Vaccine for age over 23  Completed   Flu Shot  Completed   Osteoporosis screening with Bone Density Scan  Completed   Hepatitis C Screening  Completed   Zoster (Shingles) Vaccine  Completed   Meningitis B Vaccine  Aged Out   Colon Cancer Screening  Discontinued       10/24/2024    2:49 PM  Advanced Directives  Does Patient Have a Medical Advance Directive? Yes  Type of Estate Agent of [place];Living will  Does patient want to make changes to medical advance directive? No - Patient declined  Copy of Healthcare Power of Attorney in Chart? No - copy requested    Vision: Annual vision screenings are recommended for early detection of glaucoma, cataracts, and diabetic retinopathy. These exams can also reveal signs of chronic conditions such as diabetes and high blood pressure.  Dental: Annual dental screenings help detect early signs of  oral cancer, gum disease, and other conditions linked to overall health, including heart disease and diabetes.  Please see the attached documents for additional preventive care recommendations.

## 2024-10-24 NOTE — Progress Notes (Signed)
 "  Chief Complaint  Patient presents with   Medicare Wellness     Subjective:   Patricia Peck is a 78 y.o. female who presents for a Medicare Annual Wellness Visit.  Visit info / Clinical Intake: Medicare Wellness Visit Type:: Subsequent Annual Wellness Visit Persons participating in visit and providing information:: patient Medicare Wellness Visit Mode:: Telephone If telephone:: video declined Since this visit was completed virtually, some vitals may be partially provided or unavailable. Missing vitals are due to the limitations of the virtual format.: Documented vitals are patient reported If Telephone or Video please confirm:: I connected with patient using audio/video enable telemedicine. I verified patient identity with two identifiers, discussed telehealth limitations, and patient agreed to proceed. Patient Location:: home Provider Location:: office Interpreter Needed?: No Pre-visit prep was completed: yes AWV questionnaire completed by patient prior to visit?: yes Date:: 10/24/24 Living arrangements:: (Patient-Rptd) with family/others Patient's Overall Health Status Rating: (Patient-Rptd) good Typical amount of pain: (Patient-Rptd) some Does pain affect daily life?: (Patient-Rptd) no Are you currently prescribed opioids?: no  Dietary Habits and Nutritional Risks How many meals a day?: (Patient-Rptd) 3 Eats fruit and vegetables daily?: (Patient-Rptd) yes Most meals are obtained by: (Patient-Rptd) preparing own meals In the last 2 weeks, have you had any of the following?: none Diabetic:: no  Functional Status Activities of Daily Living (to include ambulation/medication): (Patient-Rptd) Independent Ambulation: (Patient-Rptd) Independent Medication Administration: (Patient-Rptd) Independent Home Management (perform basic housework or laundry): (Patient-Rptd) Independent Manage your own finances?: (Patient-Rptd) yes Primary transportation is: (Patient-Rptd)  driving Concerns about vision?: no *vision screening is required for WTM* Concerns about hearing?: no  Fall Screening Falls in the past year?: (Patient-Rptd) 0 Number of falls in past year: 0 Was there an injury with Fall?: 0 Fall Risk Category Calculator: 0 Patient Fall Risk Level: Low Fall Risk  Fall Risk Patient at Risk for Falls Due to: Medication side effect Fall risk Follow up: Falls prevention discussed; Falls evaluation completed  Home and Transportation Safety: All rugs have non-skid backing?: (Patient-Rptd) yes All stairs or steps have railings?: (Patient-Rptd) yes Grab bars in the bathtub or shower?: (!) (Patient-Rptd) no Have non-skid surface in bathtub or shower?: (Patient-Rptd) yes Good home lighting?: (Patient-Rptd) yes Regular seat belt use?: (Patient-Rptd) yes Hospital stays in the last year:: (!) (Patient-Rptd) yes How many hospital stays:: 1 (empacted)  Cognitive Assessment Difficulty concentrating, remembering, or making decisions? : (Patient-Rptd) no Will 6CIT or Mini Cog be Completed: yes What year is it?: 0 points What month is it?: 0 points Give patient an address phrase to remember (5 components): [address] About what time is it?: 0 points Count backwards from 20 to 1: 0 points Say the months of the year in reverse: 0 points Repeat the address phrase from earlier: 0 points 6 CIT Score: 0 points  Advance Directives (For Healthcare) Does Patient Have a Medical Advance Directive?: Yes Does patient want to make changes to medical advance directive?: No - Patient declined Type of Advance Directive: Healthcare Power of [place]; Living will Copy of Healthcare Power of Attorney in Chart?: No - copy requested Copy of Living Will in Chart?: No - copy requested Would patient like information on creating a medical advance directive?: No - Patient declined  Reviewed/Updated  Reviewed/Updated: Reviewed All (Medical, Surgical, Family, Medications,  Allergies, Care Teams, Patient Goals)    Allergies (verified) Patient has no known allergies.   Current Medications (verified) Outpatient Encounter Medications as of 10/24/2024  Medication Sig  amLODipine  (NORVASC ) 5 MG tablet Take 1 tablet (5 mg total) by mouth every evening.   atorvastatin  (LIPITOR) 80 MG tablet Take 1 tablet (80 mg total) by mouth daily.   Benzocaine -Resorcinol (ANTI-ITCH VAGINAL) 20-3 % CREA Apply 1 application  topically 2 (two) times daily as needed (irritation, itching).   bumetanide  (BUMEX ) 1 MG tablet Take 1 tablet (1 mg total) by mouth daily as needed. For leg and ankle swelling. Take with potassium chloride  10mEq   Calcium  Carb-Cholecalciferol (CALCIUM  + VITAMIN D3 PO) Take 1 tablet by mouth 2 (two) times daily.   denosumab  (PROLIA ) 60 MG/ML SOLN injection Inject 60 mg into the skin once. Administer in upper arm, thigh, or abdomen   estradiol (ESTRACE) 0.1 MG/GM vaginal cream Place 1 Applicatorful vaginally 3 (three) times a week.   ezetimibe  (ZETIA ) 10 MG tablet TAKE 1 TABLET(10 MG) BY MOUTH DAILY   famotidine  (PEPCID ) 40 MG tablet Take 1 tablet (40 mg total) by mouth at bedtime.   folic acid  (FOLVITE ) 1 MG tablet Take 1 tablet (1 mg total) by mouth daily.   levothyroxine  (SYNTHROID ) 50 MCG tablet Take 1 tablet (50 mcg total) by mouth daily before breakfast.   loratadine  (CLARITIN ) 10 MG tablet Take 10 mg by mouth daily.   methocarbamol  (ROBAXIN ) 500 MG tablet Take 1 tablet (500 mg total) by mouth every 8 (eight) hours as needed for muscle spasms.   metoprolol  succinate (TOPROL -XL) 25 MG 24 hr tablet Take 3 tablets (75 mg total) by mouth daily. Take with or immediately following a meal.   montelukast  (SINGULAIR ) 10 MG tablet TAKE 1 TABLET(10 MG) BY MOUTH AT BEDTIME   nitrofurantoin  (MACRODANTIN ) 100 MG capsule Take 100 mg by mouth daily.   pantoprazole  (PROTONIX ) 40 MG tablet Take 1 tablet (40 mg total) by mouth 2 (two) times daily before a meal.    polyethylene glycol powder (GLYCOLAX /MIRALAX ) 17 GM/SCOOP powder Take 17 g by mouth daily. Dissolve 1 capful (17g) in 4-8 ounces of liquid and take by mouth daily.   potassium chloride  (KLOR-CON ) 10 MEQ tablet Take 1 tablet (10 mEq total) by mouth daily as needed. For leg and ankle swelling. Take with bumex  1mg    senna-docusate (SENOKOT-S) 8.6-50 MG tablet Take 2 tablets by mouth at bedtime.   temazepam  (RESTORIL ) 7.5 MG capsule TAKE 1 CAPSULE(7.5 MG) BY MOUTH AT BEDTIME   XARELTO  15 MG TABS tablet TAKE 1 TABLET(15 MG) BY MOUTH DAILY WITH SUPPER   Facility-Administered Encounter Medications as of 10/24/2024  Medication   denosumab  (PROLIA ) injection 60 mg    History: Past Medical History:  Diagnosis Date   Allergy    Arthritis    hands   Asthma    uses an inhaler   Atrial fibrillation (HCC)    Atrial flutter (HCC)    Depression    Dysrhythmia    a-fib, on Xarelto    GERD (gastroesophageal reflux disease)    Hyperlipidemia    Hypertension    Hypotension 03/24/2024   Hypothyroidism    Osteoporosis    Stroke (HCC) 2017   TIA   TIA (transient ischemic attack)    Past Surgical History:  Procedure Laterality Date   ABDOMINAL HYSTERECTOMY     COLONOSCOPY     MYRINGOTOMY WITH TUBE PLACEMENT Right 01/14/2016   Procedure: RIGHT MYRINGOTOMY WITH T-TUBE PLACEMENT;  Surgeon: Daniel Moccasin, MD;  Location: [facility];  Service: ENT;  Laterality: Right;   PARTIAL HYSTERECTOMY  1989   ROBOTIC ASSISTED BILATERAL SALPINGO OOPHERECTOMY Bilateral  02/14/2021   Procedure: XI ROBOTIC ASSISTED BILATERAL SALPINGO OOPHORECTOMY;  Surgeon: Eloy Herring, MD;  Location: [facility];  Service: Gynecology;  Laterality: Bilateral;   TEE WITHOUT CARDIOVERSION N/A 12/22/2012   Procedure: TRANSESOPHAGEAL ECHOCARDIOGRAM (TEE);  Surgeon: Redell GORMAN Shallow, MD;  Location: [facility] ENDOSCOPY;  Service: Cardiovascular;  Laterality: N/A;   Family History  Problem Relation Age of Onset   Breast cancer Mother    Drug  abuse Son    Depression Son    Stroke Paternal Aunt    Heart disease Paternal Grandfather    Diabetes Maternal Grandfather    Colon cancer Neg Hx    Ovarian cancer Neg Hx    Endometrial cancer Neg Hx    Pancreatic cancer Neg Hx    Prostate cancer Neg Hx    Social History   Occupational History    Employer: RETIRED  Tobacco Use   Smoking status: Never    Passive exposure: Never   Smokeless tobacco: Never  Vaping Use   Vaping status: Never Used  Substance and Sexual Activity   Alcohol use: Not Currently    Comment: Rare   Drug use: No   Sexual activity: Not Currently   Tobacco Counseling Counseling given: Not Answered  SDOH Screenings   Food Insecurity: No Food Insecurity (10/24/2024)  Housing: Low Risk (10/24/2024)  Transportation Needs: No Transportation Needs (10/24/2024)  Utilities: Not At Risk (10/24/2024)  Alcohol Screen: Low Risk (10/24/2024)  Depression (PHQ2-9): Low Risk (10/24/2024)  Financial Resource Strain: Low Risk (10/24/2024)  Physical Activity: Insufficiently Active (10/24/2024)  Social Connections: Moderately Isolated (10/24/2024)  Stress: No Stress Concern Present (10/24/2024)  Tobacco Use: Low Risk (10/24/2024)  Health Literacy: Adequate Health Literacy (10/24/2024)   See flowsheets for full screening details  Depression Screen PHQ 2 & 9 Depression Scale- Over the past 2 weeks, how often have you been bothered by any of the following problems? Little interest or pleasure in doing things: 0 Feeling down, depressed, or hopeless (PHQ Adolescent also includes...irritable): 0 PHQ-2 Total Score: 0 Trouble falling or staying asleep, or sleeping too much: 0 Feeling tired or having little energy: 0 Poor appetite or overeating (PHQ Adolescent also includes...weight loss): 0 Feeling bad about yourself - or that you are a failure or have let yourself or your family down: 0 Trouble concentrating on things, such as reading the newspaper or watching  television (PHQ Adolescent also includes...like school work): 0 Moving or speaking so slowly that other people could have noticed. Or the opposite - being so fidgety or restless that you have been moving around a lot more than usual: 0 Thoughts that you would be better off dead, or of hurting yourself in some way: 0 PHQ-9 Total Score: 0 If you checked off any problems, how difficult have these problems made it for you to do your work, take care of things at home, or get along with other people?: Not difficult at all  Depression Treatment Depression Interventions/Treatment : EYV7-0 Score <4 Follow-up Not Indicated     Goals Addressed             This Visit's Progress    Patient Stated       10/24/2024, continue to exercise regularly             Objective:    Today's Vitals   10/24/24 1541  Weight: 114 lb (51.7 kg)  Height: 5' 3 (1.6 m)   Body mass index is 20.19 kg/m.  Hearing/Vision screen Hearing Screening - Comments::  Denies hearing issues Vision Screening - Comments:: Regular eye exams, Dr. Kari Immunizations and Health Maintenance Health Maintenance  Topic Date Due   COVID-19 Vaccine (10 - 2025-26 season) 07/04/2024   DTaP/Tdap/Td (3 - Tdap) 04/16/2025   Mammogram  08/11/2025   Medicare Annual Wellness (AWV)  10/24/2025   Pneumococcal Vaccine: 50+ Years  Completed   Influenza Vaccine  Completed   Bone Density Scan  Completed   Hepatitis C Screening  Completed   Zoster Vaccines- Shingrix  Completed   Meningococcal B Vaccine  Aged Out   Colonoscopy  Discontinued        Assessment/Plan:  This is a routine wellness examination for An[place]a.  Patient Care Team: Nche, Roselie Rockford, NP as PCP - General (Internal Medicine) Pietro Redell RAMAN, MD as PCP - Cardiology (Cardiology) Karis Clunes, MD as Consulting Physician (Otolaryngology) Kari Alm PARAS, OD (Optometry)  I have personally reviewed and noted the following in the patients chart:   Medical and  social history Use of alcohol, tobacco or illicit drugs  Current medications and supplements including opioid prescriptions. Functional ability and status Nutritional status Physical activity Advanced directives List of other physicians Hospitalizations, surgeries, and ER visits in previous 12 months Vitals Screenings to include cognitive, depression, and falls Referrals and appointments  No orders of the defined types were placed in this encounter.  In addition, I have reviewed and discussed with patient certain preventive protocols, quality metrics, and best practice recommendations. A written personalized care plan for preventive services as well as general preventive health recommendations were provided to patient.   Ardella FORBES Dawn, LPN   [id number]/7974   Return in 1 year (on 10/24/2025).  After Visit Summary: (MyChart) Due to this being a telephonic visit, the after visit summary with patients personalized plan was offered to patient via MyChart   Nurse Notes: HM Addressed: Vaccines Due: covid  "

## 2024-11-08 NOTE — Telephone Encounter (Signed)
 11/08/23 BP documented in VS flowsheet.

## 2024-11-14 ENCOUNTER — Other Ambulatory Visit: Payer: Self-pay | Admitting: Nurse Practitioner

## 2024-11-14 ENCOUNTER — Other Ambulatory Visit: Payer: Self-pay | Admitting: Cardiology

## 2024-11-14 DIAGNOSIS — E7800 Pure hypercholesterolemia, unspecified: Secondary | ICD-10-CM

## 2024-11-14 DIAGNOSIS — I4821 Permanent atrial fibrillation: Secondary | ICD-10-CM

## 2024-11-14 DIAGNOSIS — I10 Essential (primary) hypertension: Secondary | ICD-10-CM

## 2024-11-21 ENCOUNTER — Other Ambulatory Visit: Payer: Self-pay | Admitting: Cardiology

## 2024-11-21 ENCOUNTER — Other Ambulatory Visit: Payer: Self-pay | Admitting: Nurse Practitioner

## 2024-11-21 DIAGNOSIS — I480 Paroxysmal atrial fibrillation: Secondary | ICD-10-CM

## 2024-11-21 DIAGNOSIS — E038 Other specified hypothyroidism: Secondary | ICD-10-CM

## 2024-12-05 NOTE — Progress Notes (Unsigned)
 "    HPI: FU atrial fibrillation and TIA. Brain MRI in January 2014 showed no acute infarct, mild small vessel disease. TEE 2/14 showed normal LV function, mild mitral valve prolapse with mild mitral regurgitation, trace aortic insufficiency. There was no PFO. Monitor in March of 2014 showed paroxysmal atrial fibrillation/flutter. She was therefore started on anticoagulation. Carotid Dopplers 10/20 showed 1-39% bilateral stenosis; left subclavian stenosis noted.  Cardiac CTA October 2020 showed calcium  score 7 but no obstructive coronary disease. Monitor June 2021 showed sinus rhythm with PACs and PAF.  Echocardiogram February 2022 showed normal LV function, mild aortic insufficiency. Since last seen,   Current Outpatient Medications  Medication Sig Dispense Refill   amLODipine  (NORVASC ) 5 MG tablet TAKE 1 TABLET(5 MG) BY MOUTH EVERY EVENING 90 tablet 1   atorvastatin  (LIPITOR) 80 MG tablet Take 1 tablet (80 mg total) by mouth daily. 90 tablet 1   Benzocaine -Resorcinol (ANTI-ITCH VAGINAL) 20-3 % CREA Apply 1 application  topically 2 (two) times daily as needed (irritation, itching).     bumetanide  (BUMEX ) 1 MG tablet Take 1 tablet (1 mg total) by mouth daily as needed. For leg and ankle swelling. Take with potassium chloride  10mEq 30 tablet 5   Calcium  Carb-Cholecalciferol (CALCIUM  + VITAMIN D3 PO) Take 1 tablet by mouth 2 (two) times daily.     denosumab  (PROLIA ) 60 MG/ML SOLN injection Inject 60 mg into the skin once. Administer in upper arm, thigh, or abdomen 180 mL 0   estradiol (ESTRACE) 0.1 MG/GM vaginal cream Place 1 Applicatorful vaginally 3 (three) times a week.     ezetimibe  (ZETIA ) 10 MG tablet TAKE 1 TABLET(10 MG) BY MOUTH DAILY 30 tablet 0   famotidine  (PEPCID ) 40 MG tablet Take 1 tablet (40 mg total) by mouth at bedtime. 90 tablet 1   folic acid  (FOLVITE ) 1 MG tablet Take 1 tablet (1 mg total) by mouth daily. 90 tablet 3   levothyroxine  (SYNTHROID ) 50 MCG tablet Take 1 tablet (50 mcg  total) by mouth daily before breakfast. Need to complete labs for additional refills. 90 tablet 0   loratadine  (CLARITIN ) 10 MG tablet Take 10 mg by mouth daily.     methocarbamol  (ROBAXIN ) 500 MG tablet Take 1 tablet (500 mg total) by mouth every 8 (eight) hours as needed for muscle spasms. 15 tablet 0   metoprolol  succinate (TOPROL -XL) 25 MG 24 hr tablet Take 3 tablets (75 mg total) by mouth daily. Take with or immediately following a meal. 90 tablet 1   montelukast  (SINGULAIR ) 10 MG tablet TAKE 1 TABLET(10 MG) BY MOUTH AT BEDTIME 90 tablet 1   nitrofurantoin  (MACRODANTIN ) 100 MG capsule Take 100 mg by mouth daily.     pantoprazole  (PROTONIX ) 40 MG tablet Take 1 tablet (40 mg total) by mouth 2 (two) times daily before a meal. 180 tablet 1   polyethylene glycol powder (GLYCOLAX /MIRALAX ) 17 GM/SCOOP powder Take 17 g by mouth daily. Dissolve 1 capful (17g) in 4-8 ounces of liquid and take by mouth daily.     potassium chloride  (KLOR-CON ) 10 MEQ tablet Take 1 tablet (10 mEq total) by mouth daily as needed. For leg and ankle swelling. Take with bumex  1mg  90 tablet 0   senna-docusate (SENOKOT-S) 8.6-50 MG tablet Take 2 tablets by mouth at bedtime. 60 tablet 2   temazepam  (RESTORIL ) 7.5 MG capsule TAKE 1 CAPSULE(7.5 MG) BY MOUTH AT BEDTIME 30 capsule 5   XARELTO  15 MG TABS tablet TAKE 1 TABLET(15 MG) BY MOUTH DAILY WITH  SUPPER 90 tablet 1   Current Facility-Administered Medications  Medication Dose Route Frequency Provider Last Rate Last Admin   denosumab  (PROLIA ) injection 60 mg  60 mg Subcutaneous Q6 months McElwee, Lauren A, NP   60 mg at 08/26/24 1623     Past Medical History:  Diagnosis Date   Allergy    Arthritis    hands   Asthma    uses an inhaler   Atrial fibrillation (HCC)    Atrial flutter (HCC)    Depression    Dysrhythmia    a-fib, on Xarelto    GERD (gastroesophageal reflux disease)    Hyperlipidemia    Hypertension    Hypotension 03/24/2024   Hypothyroidism     Osteoporosis    Stroke (HCC) 2017   TIA   TIA (transient ischemic attack)     Past Surgical History:  Procedure Laterality Date   ABDOMINAL HYSTERECTOMY     COLONOSCOPY     MYRINGOTOMY WITH TUBE PLACEMENT Right 01/14/2016   Procedure: RIGHT MYRINGOTOMY WITH T-TUBE PLACEMENT;  Surgeon: Daniel Moccasin, MD;  Location: [facility];  Service: ENT;  Laterality: Right;   PARTIAL HYSTERECTOMY  1989   ROBOTIC ASSISTED BILATERAL SALPINGO OOPHERECTOMY Bilateral 02/14/2021   Procedure: XI ROBOTIC ASSISTED BILATERAL SALPINGO OOPHORECTOMY;  Surgeon: Eloy Herring, MD;  Location: [facility];  Service: Gynecology;  Laterality: Bilateral;   TEE WITHOUT CARDIOVERSION N/A 12/22/2012   Procedure: TRANSESOPHAGEAL ECHOCARDIOGRAM (TEE);  Surgeon: Redell GORMAN Shallow, MD;  Location: [facility];  Service: Cardiovascular;  Laterality: N/A;    Social History   Socioeconomic History   Marital status: Widowed    Spouse name: Not on file   Number of children: 2   Years of education: Not on file   Highest education level: Some college, no degree  Occupational History    Employer: RETIRED  Tobacco Use   Smoking status: Never    Passive exposure: Never   Smokeless tobacco: Never  Vaping Use   Vaping status: Never Used  Substance and Sexual Activity   Alcohol use: Not Currently    Comment: Rare   Drug use: No   Sexual activity: Not Currently  Other Topics Concern   Not on file  Social History Narrative   Does have HPOA- sons Deward and Larnell.   Does desire life support if not futile.   Not sure about feeding tubes.   Social Drivers of Health   Tobacco Use: Low Risk (10/24/2024)   Patient History    Smoking Tobacco Use: Never    Smokeless Tobacco Use: Never    Passive Exposure: Never  Financial Resource Strain: Low Risk (10/24/2024)   Overall Financial Resource Strain (CARDIA)    Difficulty of Paying Living Expenses: Not hard at all  Food Insecurity: No Food Insecurity (10/24/2024)   Epic    Worried  About Radiation Protection Practitioner of Food in the Last Year: Never true    Ran Out of Food in the Last Year: Never true  Transportation Needs: No Transportation Needs (10/24/2024)   Epic    Lack of Transportation (Medical): No    Lack of Transportation (Non-Medical): No  Physical Activity: Insufficiently Active (10/24/2024)   Exercise Vital Sign    Days of Exercise per Week: 5 days    Minutes of Exercise per Session: 20 min  Stress: No Stress Concern Present (10/24/2024)   [employer] of Occupational Health - Occupational Stress Questionnaire    Feeling of Stress: Not at all  Social Connections: Moderately Isolated (  10/24/2024)   Social Connection and Isolation Panel    Frequency of Communication with Friends and Family: More than three times a week    Frequency of Social Gatherings with Friends and Family: Once a week    Attends Religious Services: More than 4 times per year    Active Member of [employer] or Organizations: No    Attends Banker Meetings: Never    Marital Status: Widowed  Intimate Partner Violence: Not At Risk (10/24/2024)   Epic    Fear of Current or Ex-Partner: No    Emotionally Abused: No    Physically Abused: No    Sexually Abused: No  Depression (PHQ2-9): Low Risk (10/24/2024)   Depression (PHQ2-9)    PHQ-2 Score: 0  Alcohol Screen: Low Risk (10/24/2024)   Alcohol Screen    Last Alcohol Screening Score (AUDIT): 1  Housing: Low Risk (10/24/2024)   Epic    Unable to Pay for Housing in the Last Year: No    Number of Times Moved in the Last Year: 0    Homeless in the Last Year: No  Utilities: Not At Risk (10/24/2024)   Epic    Threatened with loss of utilities: No  Health Literacy: Adequate Health Literacy (10/24/2024)   B1300 Health Literacy    Frequency of need for help with medical instructions: Never    Family History  Problem Relation Age of Onset   Breast cancer Mother    Drug abuse Son    Depression Son    Stroke Paternal Aunt    Heart disease  Paternal Grandfather    Diabetes Maternal Grandfather    Colon cancer Neg Hx    Ovarian cancer Neg Hx    Endometrial cancer Neg Hx    Pancreatic cancer Neg Hx    Prostate cancer Neg Hx     ROS: no fevers or chills, productive cough, hemoptysis, dysphasia, odynophagia, melena, hematochezia, dysuria, hematuria, rash, seizure activity, orthopnea, PND, pedal edema, claudication. Remaining systems are negative.  Physical Exam: Well-developed well-nourished in no acute distress.  Skin is warm and dry.  HEENT is normal.  Neck is supple.  Chest is clear to auscultation with normal expansion.  Cardiovascular exam is regular rate and rhythm.  Abdominal exam nontender or distended. No masses palpated. Extremities show no edema. neuro grossly intact  ECG- personally reviewed  A/P  1 paroxysmal atrial fibrillation-patient remains in sinus rhythm.  Continue Toprol  and Xarelto  at present dose.  2 hyperlipidemia-continue Zetia  and Lipitor.  3 hypertension-blood pressure controlled.  Continue present medical regimen.  4 aortic insufficiency-mild on most recent echocardiogram.  5 question history of mitral valve prolapse-not evident on most recent echocardiogram.  6 subclavian stenosis-continue statin.  7 carotid artery disease-mild on most recent Dopplers.  Redell Shallow, MD    "

## 2024-12-14 ENCOUNTER — Ambulatory Visit: Admitting: Cardiology

## 2025-01-27 ENCOUNTER — Other Ambulatory Visit

## 2025-02-24 ENCOUNTER — Ambulatory Visit: Admitting: Nurse Practitioner

## 2025-10-30 ENCOUNTER — Ambulatory Visit
